# Patient Record
Sex: Male | Born: 1950
Health system: Southern US, Community
[De-identification: ages and names within clinical notes are randomized; demographics above are authoritative.]

## PROBLEM LIST (undated history)

## (undated) DIAGNOSIS — C3492 Malignant neoplasm of unspecified part of left bronchus or lung: Secondary | ICD-10-CM

## (undated) DIAGNOSIS — C7951 Secondary malignant neoplasm of bone: Secondary | ICD-10-CM

## (undated) DIAGNOSIS — Z9221 Personal history of antineoplastic chemotherapy: Secondary | ICD-10-CM

## (undated) DIAGNOSIS — E785 Hyperlipidemia, unspecified: Secondary | ICD-10-CM

## (undated) DIAGNOSIS — T7840XA Allergy, unspecified, initial encounter: Secondary | ICD-10-CM

## (undated) DIAGNOSIS — I4891 Unspecified atrial fibrillation: Secondary | ICD-10-CM

## (undated) DIAGNOSIS — J984 Other disorders of lung: Secondary | ICD-10-CM

## (undated) DIAGNOSIS — I10 Essential (primary) hypertension: Secondary | ICD-10-CM

## (undated) DIAGNOSIS — H269 Unspecified cataract: Secondary | ICD-10-CM

## (undated) DIAGNOSIS — Z973 Presence of spectacles and contact lenses: Secondary | ICD-10-CM

## (undated) DIAGNOSIS — R011 Cardiac murmur, unspecified: Secondary | ICD-10-CM

## (undated) DIAGNOSIS — J449 Chronic obstructive pulmonary disease, unspecified: Secondary | ICD-10-CM

## (undated) DIAGNOSIS — F419 Anxiety disorder, unspecified: Secondary | ICD-10-CM

## (undated) DIAGNOSIS — Z974 Presence of external hearing-aid: Secondary | ICD-10-CM

## (undated) DIAGNOSIS — J189 Pneumonia, unspecified organism: Secondary | ICD-10-CM

## (undated) DIAGNOSIS — K219 Gastro-esophageal reflux disease without esophagitis: Secondary | ICD-10-CM

## (undated) DIAGNOSIS — R1310 Dysphagia, unspecified: Secondary | ICD-10-CM

## (undated) DIAGNOSIS — M8440XA Pathological fracture, unspecified site, initial encounter for fracture: Secondary | ICD-10-CM

## (undated) DIAGNOSIS — E039 Hypothyroidism, unspecified: Secondary | ICD-10-CM

## (undated) DIAGNOSIS — I4811 Longstanding persistent atrial fibrillation: Secondary | ICD-10-CM

## (undated) DIAGNOSIS — Z923 Personal history of irradiation: Secondary | ICD-10-CM

## (undated) DIAGNOSIS — F32A Depression, unspecified: Secondary | ICD-10-CM

## (undated) DIAGNOSIS — E119 Type 2 diabetes mellitus without complications: Secondary | ICD-10-CM

## (undated) DIAGNOSIS — I499 Cardiac arrhythmia, unspecified: Secondary | ICD-10-CM

## (undated) DIAGNOSIS — Z9079 Acquired absence of other genital organ(s): Secondary | ICD-10-CM

## (undated) DIAGNOSIS — M1990 Unspecified osteoarthritis, unspecified site: Secondary | ICD-10-CM

## (undated) HISTORY — DX: Type 2 diabetes mellitus without complications: E11.9

## (undated) HISTORY — DX: Allergy, unspecified, initial encounter: T78.40XA

## (undated) HISTORY — PX: OTHER SURGICAL HISTORY: SHX169

## (undated) HISTORY — DX: Unspecified cataract: H26.9

## (undated) HISTORY — DX: Hyperlipidemia, unspecified: E78.5

## (undated) HISTORY — DX: Malignant neoplasm of unspecified part of left bronchus or lung: C34.92

## (undated) HISTORY — DX: Essential (primary) hypertension: I10

## (undated) HISTORY — DX: Longstanding persistent atrial fibrillation: I48.11

## (undated) HISTORY — DX: Unspecified atrial fibrillation: I48.91

## (undated) HISTORY — PX: CARDIOVERSION: SHX1299

## (undated) HISTORY — DX: Cardiac murmur, unspecified: R01.1

## (undated) HISTORY — PX: MULTIPLE TOOTH EXTRACTIONS: SHX2053

## (undated) HISTORY — PX: TONSILLECTOMY: SUR1361

## (undated) HISTORY — DX: Chronic obstructive pulmonary disease, unspecified: J44.9

## (undated) HISTORY — PX: UPPER GASTROINTESTINAL ENDOSCOPY: SHX188

## (undated) HISTORY — PX: CARDIAC CATHETERIZATION: SHX172

## (undated) HISTORY — DX: Gastro-esophageal reflux disease without esophagitis: K21.9

## (undated) HISTORY — DX: Hypothyroidism, unspecified: E03.9

## (undated) HISTORY — PX: COLONOSCOPY: SHX174

## (undated) HISTORY — DX: Acquired absence of other genital organ(s): Z90.79

---

## 2014-10-01 HISTORY — PX: BRONCHOSCOPY: SUR163

## 2014-10-01 HISTORY — PX: DG BIOPSY LUNG: HXRAD146

## 2014-12-02 HISTORY — PX: LUNG CANCER SURGERY: SHX702

## 2016-06-22 ENCOUNTER — Other Ambulatory Visit: Payer: Self-pay | Admitting: *Deleted

## 2016-06-22 NOTE — Progress Notes (Unsigned)
I received referral on Victor Huber on 06/19/16.  I contacted referring office due to more information needed for referral. I did not hear back and called again to referring office today 06/23/15.  I left vm message both days with my name and phone number to call.

## 2016-07-10 ENCOUNTER — Telehealth: Payer: Self-pay | Admitting: Internal Medicine

## 2016-07-10 NOTE — Telephone Encounter (Signed)
Cld pt to schedule an appt. Appt has been scheduled for the pt to see Dr. Julien Nordmann on 5/16 at 215pm, labs at 145pm. Pt agreed to the appt date and time. Letter mailed.

## 2016-08-13 ENCOUNTER — Other Ambulatory Visit: Payer: Medicare Other

## 2016-08-13 ENCOUNTER — Ambulatory Visit: Payer: Medicare Other | Admitting: Internal Medicine

## 2016-08-14 ENCOUNTER — Other Ambulatory Visit: Payer: Medicare Other

## 2016-08-14 ENCOUNTER — Encounter: Payer: Self-pay | Admitting: Medical Oncology

## 2016-08-14 ENCOUNTER — Ambulatory Visit: Payer: Medicare Other | Admitting: Internal Medicine

## 2016-08-14 ENCOUNTER — Other Ambulatory Visit: Payer: Self-pay | Admitting: Medical Oncology

## 2016-08-14 DIAGNOSIS — C3490 Malignant neoplasm of unspecified part of unspecified bronchus or lung: Secondary | ICD-10-CM

## 2016-08-15 ENCOUNTER — Other Ambulatory Visit (HOSPITAL_BASED_OUTPATIENT_CLINIC_OR_DEPARTMENT_OTHER): Payer: Medicare Other

## 2016-08-15 ENCOUNTER — Telehealth: Payer: Self-pay | Admitting: Internal Medicine

## 2016-08-15 ENCOUNTER — Telehealth: Payer: Self-pay | Admitting: *Deleted

## 2016-08-15 ENCOUNTER — Telehealth: Payer: Self-pay

## 2016-08-15 ENCOUNTER — Ambulatory Visit (HOSPITAL_BASED_OUTPATIENT_CLINIC_OR_DEPARTMENT_OTHER): Payer: Medicare Other | Admitting: Internal Medicine

## 2016-08-15 ENCOUNTER — Encounter: Payer: Self-pay | Admitting: Internal Medicine

## 2016-08-15 DIAGNOSIS — J449 Chronic obstructive pulmonary disease, unspecified: Secondary | ICD-10-CM

## 2016-08-15 DIAGNOSIS — Z85118 Personal history of other malignant neoplasm of bronchus and lung: Secondary | ICD-10-CM

## 2016-08-15 DIAGNOSIS — C3490 Malignant neoplasm of unspecified part of unspecified bronchus or lung: Secondary | ICD-10-CM

## 2016-08-15 DIAGNOSIS — C3492 Malignant neoplasm of unspecified part of left bronchus or lung: Secondary | ICD-10-CM

## 2016-08-15 DIAGNOSIS — Z9079 Acquired absence of other genital organ(s): Secondary | ICD-10-CM

## 2016-08-15 DIAGNOSIS — E032 Hypothyroidism due to medicaments and other exogenous substances: Secondary | ICD-10-CM

## 2016-08-15 DIAGNOSIS — I482 Chronic atrial fibrillation, unspecified: Secondary | ICD-10-CM | POA: Insufficient documentation

## 2016-08-15 DIAGNOSIS — H269 Unspecified cataract: Secondary | ICD-10-CM | POA: Insufficient documentation

## 2016-08-15 DIAGNOSIS — J984 Other disorders of lung: Secondary | ICD-10-CM | POA: Insufficient documentation

## 2016-08-15 DIAGNOSIS — E039 Hypothyroidism, unspecified: Secondary | ICD-10-CM | POA: Diagnosis not present

## 2016-08-15 DIAGNOSIS — J189 Pneumonia, unspecified organism: Secondary | ICD-10-CM | POA: Insufficient documentation

## 2016-08-15 DIAGNOSIS — I4891 Unspecified atrial fibrillation: Secondary | ICD-10-CM

## 2016-08-15 DIAGNOSIS — I480 Paroxysmal atrial fibrillation: Secondary | ICD-10-CM

## 2016-08-15 DIAGNOSIS — M1990 Unspecified osteoarthritis, unspecified site: Secondary | ICD-10-CM | POA: Insufficient documentation

## 2016-08-15 HISTORY — DX: Malignant neoplasm of unspecified part of left bronchus or lung: C34.92

## 2016-08-15 HISTORY — DX: Chronic obstructive pulmonary disease, unspecified: J44.9

## 2016-08-15 HISTORY — DX: Hypothyroidism, unspecified: E03.9

## 2016-08-15 HISTORY — DX: Unspecified atrial fibrillation: I48.91

## 2016-08-15 HISTORY — DX: Acquired absence of other genital organ(s): Z90.79

## 2016-08-15 LAB — COMPREHENSIVE METABOLIC PANEL
ALT: 21 U/L (ref 0–55)
AST: 19 U/L (ref 5–34)
Albumin: 4 g/dL (ref 3.5–5.0)
Alkaline Phosphatase: 104 U/L (ref 40–150)
Anion Gap: 8 mEq/L (ref 3–11)
BUN: 21.6 mg/dL (ref 7.0–26.0)
CHLORIDE: 105 meq/L (ref 98–109)
CO2: 27 meq/L (ref 22–29)
CREATININE: 0.9 mg/dL (ref 0.7–1.3)
Calcium: 9.3 mg/dL (ref 8.4–10.4)
EGFR: 90 mL/min/{1.73_m2} — ABNORMAL LOW (ref >90)
Glucose: 141 mg/dl — ABNORMAL HIGH (ref 70–140)
POTASSIUM: 4.2 meq/L (ref 3.5–5.1)
Sodium: 140 mEq/L (ref 136–145)
Total Bilirubin: 0.98 mg/dL (ref 0.20–1.20)
Total Protein: 6.7 g/dL (ref 6.4–8.3)

## 2016-08-15 LAB — CBC WITH DIFFERENTIAL/PLATELET
BASO%: 0.3 % (ref 0.0–2.0)
Basophils Absolute: 0 10*3/uL (ref 0.0–0.1)
EOS%: 0.9 % (ref 0.0–7.0)
Eosinophils Absolute: 0.1 10*3/uL (ref 0.0–0.5)
HCT: 46.1 % (ref 38.4–49.9)
HGB: 15.7 g/dL (ref 13.0–17.1)
LYMPH%: 29.6 % (ref 14.0–49.0)
MCH: 33 pg (ref 27.2–33.4)
MCHC: 34.1 g/dL (ref 32.0–36.0)
MCV: 96.9 fL (ref 79.3–98.0)
MONO#: 0.3 10*3/uL (ref 0.1–0.9)
MONO%: 5.8 % (ref 0.0–14.0)
NEUT#: 3.7 10*3/uL (ref 1.5–6.5)
NEUT%: 63.4 % (ref 39.0–75.0)
Platelets: 186 10*3/uL (ref 140–400)
RBC: 4.76 10*6/uL (ref 4.20–5.82)
RDW: 13.4 % (ref 11.0–14.6)
WBC: 5.9 10*3/uL (ref 4.0–10.3)
lymph#: 1.7 10*3/uL (ref 0.9–3.3)

## 2016-08-15 NOTE — Progress Notes (Signed)
[place] [phone]   [phone]  CONSULT NOTE  REFERRING PHYSICIAN: Dr. Fransisca Connors, [place]:  66 years old white male diagnosed with lung cancer.   HPI Victor Huber is a 66 y.o. male was past medical history significant for atrial fibrillation, COPD, hypothyroidism, long history of smoking as well as stage IIIa non-small cell lung cancer, adenocarcinoma diagnosed in June 2016. The patient mentioned that on a routine screening CT scan of the chest in 2014 he was found to have small nodule in the left lower lobe. This was followed by observation but repeat CT scan of the chest on 09/20/2014 showed enlargement of the left lower lobe nodule and it measured 1.9 x 2.9 by 1.8 cm in the superior segment of the left lower lobe. There was also enlarged left hilar lymph node and suspicious AP window lymph nodes. A PET scan was performed on 09/21/2014 and it showed hypermetabolic 3.0 cm left lower lobe superior segment mass in addition to hypermetabolic left hilar lymphadenopathy as well as small but hypermetabolic mediastinal lymph nodes. On 10/08/2014 the patient underwent bronchoscopy with endobronchial ultrasound and biopsy of lymph nodes from level 11 L and 7. The final cytology was negative for malignancy. The patient underwent CT-guided biopsy of the left lower lobe lung nodule and the final pathology was consistent with poorly differentiated adenocarcinoma. MRI of the brain on 11/08/2014 was negative for metastatic disease to the brain. Treatment options were discussed with the patient including neoadjuvant concurrent chemoradiation versus surgical resection. He elected to undergo surgical resection but there was large bulky and invasive lymphadenopathy that could not be dissected. The patient ended with wedge resection of the left lower lobe lung nodule in addition to the AP window lymph node dissection. The final  pathology was consistent with poorly differentiated adenocarcinoma. Immunohistochemical stains were strongly positive for cytokeratin 7 but negative for TTF-1, Napsin A and p63. I don't see any record of molecular studies for the resected tissue.  The patient was treated with a course of concurrent chemoradiation between October 11 through 03/01/2015 with weekly carboplatin and paclitaxel. His treatment was interrupted secondary to radiation induced pneumonitis but the patient completed 7 weeks of concurrent chemoradiation. He did not receive any consolidation chemotherapy. He had stable disease after his treatment. The patient was followed by the imaging studies at regular basis and the last CT scan of the chest performed on 02/20/2016 showed stable lymphedema, left hilar soft tissue fullness with left lung volume loss not significantly changed. There was also stable small right adrenal nodule and a stable small mediastinal lymph nodes. Unfortunately the patient did not bring any CDs with the imaging studies that were performed in [place]. He moved recently to [facility] to be close to his son and grandchildren.  When seen today he continues to have mild shortness breath increased with exertion as well as chronic back pain. He denied having any chest pain, cough or hemoptysis. He denied having any weight loss or night sweats. He has no nausea, vomiting, diarrhea or constipation. He denied having any headache or visual changes. Family history significant for mother with breast cancer and father died from old age. The patient is married and has 2 children. He was accompanied by his wife, [name]. He is to work as a Teacher, early years/pre. He has a history of smoking 1 pack per day for around 50 years and quit 4 years ago. He drinks alcohol occasionally  and no history of drug abuse.  HPI  Past Medical History:  Diagnosis Date  . Adenocarcinoma of left lung, stage 3 ([place]) 08/15/2016  . Atrial  fibrillation ([place]) 08/15/2016  . Atrial fibrillation ([place])   . COPD (chronic obstructive pulmonary disease) ([place]) 08/15/2016  . Hypothyroid 08/15/2016  . S/P TURP 08/15/2016    Past Surgical History:  Procedure Laterality Date  . Status post TURP      Family History  Problem Relation Age of Onset  . Cancer Mother     Social History Social History  Substance Use Topics  . Smoking status: Not on file  . Smokeless tobacco: Not on file  . Alcohol use Not on file    Not on File  Current Outpatient Prescriptions  Medication Sig Dispense Refill  . amiodarone (PACERONE) 200 MG tablet Take 200 mg by mouth 2 (two) times daily.    Marland Kitchen apixaban (ELIQUIS) 5 MG TABS tablet Take 5 mg by mouth 2 (two) times daily.    Marland Kitchen atenolol (TENORMIN) 25 MG tablet Take by mouth daily.    Marland Kitchen atorvastatin (LIPITOR) 40 MG tablet Take 40 mg by mouth daily.    . citalopram (CELEXA) 40 MG tablet Take 40 mg by mouth daily.    . cyclobenzaprine (FLEXERIL) 10 MG tablet Take 10 mg by mouth at bedtime.    . famciclovir (FAMVIR) 500 MG tablet     . fluticasone furoate-vilanterol (BREO ELLIPTA) 100-25 MCG/INH AEPB Inhale 1 puff into the lungs daily.    Marland Kitchen gabapentin (NEURONTIN) 300 MG capsule Take 300 mg by mouth 3 (three) times daily.    Marland Kitchen HYDROcodone-acetaminophen (NORCO/VICODIN) 5-325 MG tablet Take 1 tablet by mouth every 6 (six) hours as needed for moderate pain.    Marland Kitchen levothyroxine (SYNTHROID, LEVOTHROID) 200 MCG tablet Take 200 mcg by mouth daily before breakfast.    . levothyroxine (SYNTHROID, LEVOTHROID) 25 MCG tablet Take 25 mcg by mouth daily before breakfast.    . LORazepam (ATIVAN) 0.5 MG tablet     . metoprolol succinate (TOPROL-XL) 50 MG 24 hr tablet Take 50 mg by mouth daily. Take with or immediately following a meal.    . metoprolol tartrate (LOPRESSOR) 50 MG tablet Take 50 mg by mouth 2 (two) times daily.    . tamsulosin (FLOMAX) 0.4 MG CAPS capsule Take 0.4 mg by mouth.     No current  facility-administered medications for this visit.     Review of Systems  Constitutional: positive for fatigue Eyes: negative Ears, nose, mouth, throat, and face: negative Respiratory: positive for dyspnea on exertion Cardiovascular: negative Gastrointestinal: negative Genitourinary:negative Integument/breast: negative Hematologic/lymphatic: negative Musculoskeletal:positive for back pain Neurological: negative Behavioral/Psych: negative Endocrine: negative Allergic/Immunologic: negative  Physical Exam  CZY:SAYTK, healthy, no distress, well nourished and well developed SKIN: skin color, texture, turgor are normal, no rashes or significant lesions HEAD: Normocephalic, No masses, lesions, tenderness or abnormalities EYES: normal, PERRLA, Conjunctiva are pink and non-injected EARS: External ears normal, Canals clear OROPHARYNX:no exudate, no erythema and lips, buccal mucosa, and tongue normal  NECK: supple, no adenopathy, no JVD LYMPH:  no palpable lymphadenopathy, no hepatosplenomegaly LUNGS: clear to auscultation , and palpation HEART: regular rate & rhythm, no murmurs and no gallops ABDOMEN:abdomen soft, non-tender, obese, normal bowel sounds and no masses or organomegaly BACK: No CVA tenderness, Range of motion is normal EXTREMITIES:no joint deformities, effusion, or inflammation, no edema, no skin discoloration  NEURO: alert & oriented x 3 with fluent speech, no focal motor/sensory deficits  PERFORMANCE STATUS: ECOG 1  LABORATORY DATA: Lab Results  Component Value Date   WBC 5.9 08/15/2016   HGB 15.7 08/15/2016   HCT 46.1 08/15/2016   MCV 96.9 08/15/2016   PLT 186 08/15/2016      Chemistry   No results found for: NA, K, CL, CO2, BUN, CREATININE, GLU No results found for: CALCIUM, ALKPHOS, AST, ALT, BILITOT     RADIOGRAPHIC STUDIES: No results found.  ASSESSMENT:This is a very pleasant 66 years old white male diagnosed with a stage IIIa (T2a, N2, M0) non-small  cell lung cancer, poorly differentiated adenocarcinoma presented with right lower lobe lung mass in addition to mediastinal lymphadenopathy status post wedge resection of the left lower lobe lung mass as well as AP window lymph node dissection but there was residual metastatic mediastinal lymphadenopathy that could not be resected. The patient underwent a course of concurrent chemoradiation with weekly carboplatin and paclitaxel in [place] completed 03/01/2015. Molecular studies and PDL 1 status are unknown. He has been observation since that time. His last imaging studies was on 02/20/2016 and showed stable disease. He recently moved to [place].  PLAN: I had a lengthy discussion with the patient and his wife today about his current condition and treatment options. I recommended for the patient to have repeat CT scan of the chest performed next week for restaging of his disease. I also requested CDs with images of the previous his scan to be sent to my office for comparison. The patient has multiple other medical problems and he has not established care with any of the other specialties yet. I will refer the patient to establish care with a primary care physician. I also referred him to pulmonary medicine for his COPD and to cardiology for his history of atrial fibrillation. [place] upcoming scan showed no evidence for disease progression, I would see him back for follow-up visit in 6 months with repeat CT scan of the chest for restaging of his disease. The patient and his wife agreed to the current plan. He was advised to call immediately if he has any concerning symptoms in the interval. The patient voices understanding of current disease status and treatment options and is in agreement with the current care plan. All questions were answered. The patient knows to call the clinic with any problems, questions or concerns. We can certainly see the patient much sooner if necessary. Thank you so much for  allowing me to participate in the care of Victor Huber. I will continue to follow up the patient with you and assist in his care.  I spent 40 minutes counseling the patient face to face. The total time spent in the appointment was 60 minutes.  Disclaimer: This note was dictated with voice recognition software. Similar sounding words can inadvertently be transcribed and may not be corrected upon review.   Bellamarie Pflug K. Aug 15, 2016, 2:08 PM

## 2016-08-15 NOTE — Telephone Encounter (Signed)
lmtcb X1 for pt- RA has availability for a consult in [facility] office as early as this Thursday (08/16/16).

## 2016-08-15 NOTE — Telephone Encounter (Signed)
Release of information faxed to [facility] and [employer], request for pt's records and imaging on disc be sent to MD for review.

## 2016-08-15 NOTE — Telephone Encounter (Signed)
Scheduled appt per 5/16 los. Gave patient AVS and calender per 5/16. Per [place] with Pulmonary said the referral is in work que and they will contact patient.

## 2016-08-16 NOTE — Telephone Encounter (Signed)
Pt aware that we are going to keep his appt as scheduled for 09/26/16 Pt aware to contact our office anytime to check for sooner appts. Nothing further needed.

## 2016-08-16 NOTE — Telephone Encounter (Signed)
lmtcb for pt.  Appt scheduled with MW on 5/24 at 245, will need to confirm this appt with pt.

## 2016-08-16 NOTE — Telephone Encounter (Signed)
Patient called back and wanted to keep the original appt made for 06/27.  He states if we reschedule this he needs it to be on a Wed or Thurs in the morning, no other day and no afternoons.

## 2016-08-21 ENCOUNTER — Ambulatory Visit
Admission: RE | Admit: 2016-08-21 | Discharge: 2016-08-21 | Disposition: A | Discharge status: Home / Self Care | Payer: Medicare Other | Source: Ambulatory Visit | Attending: Internal Medicine | Admitting: Internal Medicine

## 2016-08-21 ENCOUNTER — Other Ambulatory Visit: Payer: Self-pay | Admitting: Internal Medicine

## 2016-08-21 ENCOUNTER — Encounter: Payer: Self-pay | Admitting: Medical Oncology

## 2016-08-21 DIAGNOSIS — C801 Malignant (primary) neoplasm, unspecified: Secondary | ICD-10-CM

## 2016-08-23 ENCOUNTER — Other Ambulatory Visit: Payer: Self-pay | Admitting: Internal Medicine

## 2016-08-23 ENCOUNTER — Inpatient Hospital Stay
Admission: RE | Admit: 2016-08-23 | Discharge: 2016-08-23 | Disposition: A | Discharge status: Home / Self Care | Payer: Self-pay | Source: Ambulatory Visit | Attending: Internal Medicine | Admitting: Internal Medicine

## 2016-08-23 ENCOUNTER — Institutional Professional Consult (permissible substitution): Payer: Medicare Other | Admitting: Internal Medicine

## 2016-08-23 ENCOUNTER — Ambulatory Visit
Admission: RE | Admit: 2016-08-23 | Discharge: 2016-08-23 | Disposition: A | Discharge status: Home / Self Care | Payer: Self-pay | Source: Ambulatory Visit | Attending: Internal Medicine | Admitting: Internal Medicine

## 2016-08-23 DIAGNOSIS — C801 Malignant (primary) neoplasm, unspecified: Secondary | ICD-10-CM

## 2016-08-27 NOTE — Progress Notes (Signed)
Cardiology Office Note   Date:  08/29/2016   ID:  Victor Huber, DOB Jul 19, 1950, MRN [id number]  PCP:  Patient, No Pcp Per  Cardiologist:   Victor Latch, MD   Chief Complaint  Patient presents with  . New Patient (Initial Visit)  . Leg Pain    pain in legs at night.      History of Present Illness: Victor Huber is a 66 y.o. male with persistent atrial fibrillation, COPD, hypothyroidism and stage IIa non-small cell lung cancer s/p resection and chemotherapy and prior tobacco abuse who is being seen today for the evaluation of atrial fibrillation at the request of Victor Bears, MD.  He moved from [place] 3 months ago and established care with Dr. Julien Huber.  At that appointment he reported mild exertional dyspnea.  Victor Huber Reports that he was diagnosed with atrial fibrillation 6 or 7 years ago. He has been chronically in atrial fibrillation lately. In the past he was on amiodarone. However this was discontinued when he was diagnosed with lung cancer in 2016. After that he was switched to another antiarrhythmic. He developed a ventricular fibrillation arrest. He is unsure what medication he was on at that time. He also had 2 cardioversions that were unsuccessful.  His main complaint is fatigue, which she attributes to the atrial fibrillation. He rarely feels his heart racing. He denies chest pain, lightheadedness, or dizziness. He does have shortness of breath chronically, which he attributes to his COPD.  He denies lower extremity edema, orthopnea, or PND.  He is due for a surveillance CT for his lung cancer tomorrow.   Past Medical History:  Diagnosis Date  . Adenocarcinoma of left lung, stage 3 ([place]) 08/15/2016  . Atrial fibrillation ([place]) 08/15/2016  . Atrial fibrillation ([place])   . COPD (chronic obstructive pulmonary disease) ([place]) 08/15/2016  . Hypothyroid 08/15/2016  . Longstanding persistent atrial fibrillation ([place]) 08/29/2016  . S/P TURP 08/15/2016    Past Surgical  History:  Procedure Laterality Date  . Status post TURP       Current Outpatient Prescriptions  Medication Sig Dispense Refill  . apixaban (ELIQUIS) 5 MG TABS tablet Take 5 mg by mouth 2 (two) times daily.    Marland Kitchen atorvastatin (LIPITOR) 40 MG tablet Take 40 mg by mouth daily.    . citalopram (CELEXA) 40 MG tablet Take 40 mg by mouth daily.    Marland Kitchen HYDROcodone-acetaminophen (NORCO/VICODIN) 5-325 MG tablet Take 1 tablet by mouth every 6 (six) hours as needed for moderate pain.    Marland Kitchen levothyroxine (SYNTHROID, LEVOTHROID) 200 MCG tablet Take 200 mcg by mouth daily before breakfast.    . levothyroxine (SYNTHROID, LEVOTHROID) 25 MCG tablet Take 25 mcg by mouth daily before breakfast.    . LORazepam (ATIVAN) 0.5 MG tablet     . tamsulosin (FLOMAX) 0.4 MG CAPS capsule Take 0.4 mg by mouth.    Marland Kitchen atenolol (TENORMIN) 50 MG tablet Take 1 tablet (50 mg total) by mouth daily. 90 tablet 3   No current facility-administered medications for this visit.     Allergies:   Patient has no known allergies.    Social History:  The patient  reports that he quit smoking about 4 years ago. His smoking use included Cigarettes. He has a 50.00 pack-year smoking history. He has never used smokeless tobacco. He reports that he does not drink alcohol or use drugs.   Family History:  The patient's family history includes Breast cancer in his maternal aunt, maternal aunt,  and mother.    ROS:  Please see the history of present illness.   Otherwise, review of systems are positive for none.   All other systems are reviewed and negative.    PHYSICAL EXAM: VS:  BP (!) 136/91   Pulse 92   Ht 6\' 1"  (1.854 m)   Wt 119.7 kg (263 lb 12.8 oz)   BMI 34.80 kg/m  , BMI Body mass index is 34.8 kg/m. GENERAL:  Well appearing HEENT:  Pupils equal round and reactive, fundi not visualized, oral mucosa unremarkable NECK:  No jugular venous distention, waveform within normal limits, carotid upstroke brisk and symmetric, no bruits, no  thyromegaly LYMPHATICS:  No cervical adenopathy LUNGS:  Clear to auscultation bilaterally HEART:  Irregularly irregular.  PMI not displaced or sustained,S1 and S2 within normal limits, no S3, no S4, no clicks, no rubs, no murmurs ABD:  Flat, positive bowel sounds normal in frequency in pitch, no bruits, no rebound, no guarding, no midline pulsatile mass, no hepatomegaly, no splenomegaly EXT:  2 plus pulses throughout, no edema, no cyanosis no clubbing SKIN:  No rashes no nodules NEURO:  Cranial nerves II through XII grossly intact, motor grossly intact throughout PSYCH:  Cognitively intact, oriented to person place and time   EKG:  EKG is ordered today. The ekg ordered 08/29/16 demonstrates atrial fibrillation. Rate 92 bpm. PVCs. Right axis deviation. Incomplete right bundle branch block. Nonspecific ST/T changes.   Recent Labs: 08/15/2016: ALT 21; BUN 21.6; Creatinine 0.9; HGB 15.7; Platelets 186; Potassium 4.2; Sodium 140    Lipid Panel No results found for: CHOL, TRIG, HDL, CHOLHDL, VLDL, LDLCALC, LDLDIRECT    Wt Readings from Last 3 Encounters:  08/29/16 119.7 kg (263 lb 12.8 oz)  08/15/16 119.6 kg (263 lb 9.6 oz)      ASSESSMENT AND PLAN:  # Longstanding persistent atrial fibrillation: Victor Huber reports feeling fatigued. He felt better on atenolol. We will switch from metoprolol to atenolol 50 mg daily. He is also interested in trying to restore sinus rhythm. He previously did well on amiodarone but this was discontinued when he developed lung cancer. We will refer him to the atrial fibrillation clinic to discuss options. We will also obtain his records, as he reportedly had an episode of ventricular fibrillation on an antiarrhythmic.  We will get a copy of his most recent echo that reportedly is a couple months old.  If he doesn't have a recent one we will repeat for assessment of systolic function and LV size.  He was resistant to the idea of ablation in the past.   #  Elevated blood pressure: Switch to atenolol as above.   Current medicines are reviewed at length with the patient today.  The patient does not have concerns regarding medicines.  The following changes have been made:  no change  Labs/ tests ordered today include:   Orders Placed This Encounter  Procedures  . Amb Referral to AFIB Clinic  . EKG 12-Lead     Disposition:   FU with Zedrick Springsteen C. Oval Linsey, MD, [facility] in 4 months.  Atrial fibrillation clinic in 1 month.     This note was written with the assistance of speech recognition software.  Please excuse any transcriptional errors.  Signed, Kenyia Wambolt C. Oval Linsey, MD, [facility]  08/29/2016 9:49 AM    [facility]

## 2016-08-28 ENCOUNTER — Ambulatory Visit
Admission: RE | Admit: 2016-08-28 | Discharge: 2016-08-28 | Disposition: A | Discharge status: Home / Self Care | Payer: Medicare Other | Source: Ambulatory Visit | Attending: Internal Medicine | Admitting: Internal Medicine

## 2016-08-28 ENCOUNTER — Other Ambulatory Visit: Payer: Self-pay | Admitting: Internal Medicine

## 2016-08-28 ENCOUNTER — Telehealth: Payer: Self-pay | Admitting: *Deleted

## 2016-08-28 DIAGNOSIS — C3492 Malignant neoplasm of unspecified part of left bronchus or lung: Secondary | ICD-10-CM

## 2016-08-28 DIAGNOSIS — I480 Paroxysmal atrial fibrillation: Secondary | ICD-10-CM

## 2016-08-28 DIAGNOSIS — C801 Malignant (primary) neoplasm, unspecified: Secondary | ICD-10-CM

## 2016-08-28 DIAGNOSIS — J449 Chronic obstructive pulmonary disease, unspecified: Secondary | ICD-10-CM

## 2016-08-28 DIAGNOSIS — E032 Hypothyroidism due to medicaments and other exogenous substances: Secondary | ICD-10-CM

## 2016-08-28 DIAGNOSIS — Z9079 Acquired absence of other genital organ(s): Secondary | ICD-10-CM

## 2016-08-28 NOTE — Telephone Encounter (Signed)
"  My husband is to have CT scan chest this month.  We have not heard anything.  Was something mis-communicated?"  Pre-certification coordinator confirms approval for the May CT scan with November pending.  Spoke with [facility] Scheduling who will call patient to schedule for this month.

## 2016-08-29 ENCOUNTER — Ambulatory Visit (INDEPENDENT_AMBULATORY_CARE_PROVIDER_SITE_OTHER): Payer: Medicare Other | Admitting: Cardiovascular Disease

## 2016-08-29 ENCOUNTER — Encounter: Payer: Self-pay | Admitting: Cardiovascular Disease

## 2016-08-29 VITALS — BP 136/91 | HR 92 | Ht 73.0 in | Wt 263.8 lb

## 2016-08-29 DIAGNOSIS — I481 Persistent atrial fibrillation: Secondary | ICD-10-CM | POA: Diagnosis not present

## 2016-08-29 DIAGNOSIS — I4811 Longstanding persistent atrial fibrillation: Secondary | ICD-10-CM

## 2016-08-29 DIAGNOSIS — I4819 Other persistent atrial fibrillation: Secondary | ICD-10-CM

## 2016-08-29 DIAGNOSIS — I10 Essential (primary) hypertension: Secondary | ICD-10-CM | POA: Insufficient documentation

## 2016-08-29 HISTORY — DX: Longstanding persistent atrial fibrillation: I48.11

## 2016-08-29 MED ORDER — ATENOLOL 50 MG PO TABS: 50.0000 mg | ORAL_TABLET | Freq: Every day | ORAL | 3 refills | Status: DC

## 2016-08-29 MED ORDER — ATENOLOL 50 MG PO TABS: 50.0000 mg | ORAL_TABLET | Freq: Every day | ORAL | 1 refills | Status: DC

## 2016-08-29 NOTE — Patient Instructions (Addendum)
Medication Instructions:  STOP METOPROLOL   START ATENOLOL 50 MG DAILY   Labwork: NONE  Testing/Procedures: NONE  Follow-Up: Your physician recommends that you schedule a follow-up appointment in: A FIB CLINIC IN [place] physician recommends that you schedule a follow-up appointment in: DR [facility] 4 MONTHS   If you need a refill on your cardiac medications before your next appointment, please call your pharmacy.

## 2016-08-30 ENCOUNTER — Ambulatory Visit (HOSPITAL_COMMUNITY)
Admission: RE | Admit: 2016-08-30 | Discharge: 2016-08-30 | Disposition: A | Discharge status: Home / Self Care | Payer: Medicare Other | Source: Ambulatory Visit | Attending: Internal Medicine | Admitting: Internal Medicine

## 2016-08-30 DIAGNOSIS — I700 Atherosclerosis of aorta: Secondary | ICD-10-CM | POA: Diagnosis not present

## 2016-08-30 DIAGNOSIS — I2510 Atherosclerotic heart disease of native coronary artery without angina pectoris: Secondary | ICD-10-CM | POA: Insufficient documentation

## 2016-08-30 DIAGNOSIS — J449 Chronic obstructive pulmonary disease, unspecified: Secondary | ICD-10-CM | POA: Diagnosis not present

## 2016-08-30 DIAGNOSIS — C3492 Malignant neoplasm of unspecified part of left bronchus or lung: Secondary | ICD-10-CM

## 2016-08-30 DIAGNOSIS — E032 Hypothyroidism due to medicaments and other exogenous substances: Secondary | ICD-10-CM

## 2016-08-30 DIAGNOSIS — Z9079 Acquired absence of other genital organ(s): Secondary | ICD-10-CM

## 2016-08-30 DIAGNOSIS — I480 Paroxysmal atrial fibrillation: Secondary | ICD-10-CM

## 2016-08-30 DIAGNOSIS — J439 Emphysema, unspecified: Secondary | ICD-10-CM | POA: Insufficient documentation

## 2016-08-30 MED ORDER — IOPAMIDOL (ISOVUE-300) INJECTION 61%
INTRAVENOUS | Status: AC
Start: 2016-08-30 — End: 2016-08-30
  Filled 2016-08-30: qty 75

## 2016-08-30 MED ORDER — IOPAMIDOL (ISOVUE-300) INJECTION 61%
75.0000 mL | Freq: Once | INTRAVENOUS | Status: AC | PRN
  Administered 2016-08-30: 75 mL via INTRAVENOUS

## 2016-09-26 ENCOUNTER — Other Ambulatory Visit: Payer: Medicare Other

## 2016-09-26 ENCOUNTER — Ambulatory Visit (INDEPENDENT_AMBULATORY_CARE_PROVIDER_SITE_OTHER): Payer: Medicare Other | Admitting: Pulmonary Disease

## 2016-09-26 ENCOUNTER — Telehealth: Payer: Self-pay | Admitting: Pulmonary Disease

## 2016-09-26 ENCOUNTER — Encounter: Payer: Self-pay | Admitting: Pulmonary Disease

## 2016-09-26 VITALS — BP 112/80 | HR 71 | Ht 73.0 in | Wt 267.4 lb

## 2016-09-26 DIAGNOSIS — C3492 Malignant neoplasm of unspecified part of left bronchus or lung: Secondary | ICD-10-CM

## 2016-09-26 DIAGNOSIS — E785 Hyperlipidemia, unspecified: Secondary | ICD-10-CM | POA: Insufficient documentation

## 2016-09-26 DIAGNOSIS — J432 Centrilobular emphysema: Secondary | ICD-10-CM

## 2016-09-26 DIAGNOSIS — F419 Anxiety disorder, unspecified: Secondary | ICD-10-CM | POA: Insufficient documentation

## 2016-09-26 NOTE — Patient Instructions (Signed)
   Let me know if you have any new breathing problems or questions before your next appointment.  We will get records from your previous Pulmonologist.  TESTS ORDERED: 1. Serum Alpha-1 Antitrypsin Phenotype

## 2016-09-26 NOTE — Telephone Encounter (Signed)
A medical release form was faxed to [facility] at [phone] for the continuation of care; the medical records were received. Pt made aware of the receiving of the records. Nothing further is needed.

## 2016-09-26 NOTE — Progress Notes (Signed)
Subjective:    Patient ID: Victor Huber, male    DOB: [birth year], 66 y.o.   MRN: [id number]  HPI He reports he was diagnosed with COPD at the same time as his cancer in 2016. He denies any dyspnea, coughing, or wheezing at that time. He has a rescue inhaler but never uses it. He developed radiation pneumonitis. He was treated with Prednisone and tapered off a couple of years ago. Denies any history of recurrent bronchitis or pneumonia. No chest pain, tightness, or pressure. No abdominal pain, nausea, or emesis. No persistent headache. No focal vision loss, weakness, numbness, or tingling. No history of breathing problems, asthma, or allergies as a child or young adult.   Review of Systems No dysuria or hematuria. Does have urinary hesitancy. No rashes or bruising. A pertinent 14 point review of systems is negative except as per the history of presenting illness.  No Known Allergies  Current Outpatient Prescriptions on File Prior to Visit  Medication Sig Dispense Refill  . apixaban (ELIQUIS) 5 MG TABS tablet Take 5 mg by mouth 2 (two) times daily.    Marland Kitchen atorvastatin (LIPITOR) 40 MG tablet Take 40 mg by mouth daily.    . citalopram (CELEXA) 40 MG tablet Take 40 mg by mouth daily.    Marland Kitchen HYDROcodone-acetaminophen (NORCO/VICODIN) 5-325 MG tablet Take 1 tablet by mouth every 6 (six) hours as needed for moderate pain.    Marland Kitchen levothyroxine (SYNTHROID, LEVOTHROID) 200 MCG tablet Take 200 mcg by mouth daily before breakfast.    . levothyroxine (SYNTHROID, LEVOTHROID) 25 MCG tablet Take 25 mcg by mouth daily before breakfast.    . LORazepam (ATIVAN) 0.5 MG tablet     . tamsulosin (FLOMAX) 0.4 MG CAPS capsule Take 0.4 mg by mouth.    Marland Kitchen atenolol (TENORMIN) 50 MG tablet Take 1 tablet (50 mg total) by mouth daily. (Patient not taking: Reported on 09/26/2016) 90 tablet 3   No current facility-administered medications on file prior to visit.     Past Medical History:  Diagnosis Date  . Adenocarcinoma of  left lung, stage 3 ([place]) 08/15/2016  . Atrial fibrillation ([place]) 08/15/2016  . Atrial fibrillation ([place])   . COPD (chronic obstructive pulmonary disease) ([place]) 08/15/2016  . Hyperlipidemia   . Hypothyroid 08/15/2016  . Longstanding persistent atrial fibrillation ([place]) 08/29/2016  . S/P TURP 08/15/2016    Past Surgical History:  Procedure Laterality Date  . BRONCHOSCOPY  10/2014  . CARDIOVERSION     x2  . DG BIOPSY LUNG Left 10/2014   FNA - Adenocarcinoma   . LUNG CANCER SURGERY Left 12/2014   Wedge Resection   . Status post TURP      Family History  Problem Relation Age of Onset  . Breast cancer Mother   . Breast cancer Maternal Aunt   . Breast cancer Maternal Aunt   . Lung disease Neg Hx     Social History   Social History  . Marital status: Married    Spouse name: N/A  . Number of children: N/A  . Years of education: N/A   Social History Main Topics  . Smoking status: Former Smoker    Packs/day: 1.00    Years: 50.00    Types: Cigarettes    Quit date: 08/15/2012  . Smokeless tobacco: Never Used  . Alcohol use No  . Drug use: No  . Sexual activity: Not Asked   Other Topics Concern  . None   Social History Narrative   Financial controller  Pulmonary (09/26/16):   Originally from [place]. Moved to [facility] February 2018. Always lived in [place]. Moved to be closer to children & grandchildren. No international travel. 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 No mold exposure. No bird exposure or hot tub exposure. Enjoys reading. Previously enjoyed wood working with domestic woods.       Objective:   Physical Exam BP 112/80 (BP Location: Right Arm, Cuff Size: Large)   Pulse 71   Ht 6\' 1"  (1.854 m)   Wt 267 lb 6.4 oz (121.3 kg)   SpO2 97%   BMI 35.28 kg/m  General:  Awake. Alert. No acute distress. Central obesity. Integument:  Warm & dry. No rash on exposed skin. No bruising on exposed skin. Extremities:  No  cyanosis or clubbing.  Lymphatics:  No appreciated cervical or supraclavicular lymphadenoapthy. HEENT:  Moist mucus membranes. No oral ulcers. No scleral injection or icterus. Minimal nasal turbinate swelling. Cardiovascular:  Regular rate. No edema. No appreciable JVD.  Pulmonary:  Mild crackles left mid lung zone/base. Symmetric chest wall expansion. No accessory muscle use on room air. Abdomen: Soft. Normal bowel sounds. Protuberant. Grossly nontender. Musculoskeletal:  Normal bulk and tone. Hand grip strength 5/5 bilaterally. No joint deformity or effusion appreciated. Neurological:  CN 2-12 grossly in tact. No meningismus. Moving all 4 extremities equally. Symmetric brachioradialis deep tendon reflexes. Psychiatric:  Mood and affect congruent. Speech normal rhythm, rate & tone.   IMAGING CT CHEST W/ CONTRAST 08/30/16 (personally reviewed by me):  Subcarinal lymph node measuring 1.1 cm in short axis. No other pathologically enlarged mediastinal or hilar adenopathy appreciated. No pleural effusion. Pleural thickening on the left with left infrahilar consolidation with air bronchograms present with an opacity. No other parenchymal opacity, nodule, or mass appreciated. No pericardial effusion. Apical predominant centrilobular emphysematous changes.  PATHOLOGY Left Lower Lobe Wedge Resection (12/15/14): Adenocarcinoma 3 cm in greatest dimension & poorly differentiated. Lymphovascular invasion present. Invasive carcinoma 3 mm from parenchymal margin. Lymph Node Level 11L, 7 & AP Window (12/15/14): Positive only in AP window node for metastatic poorly differentiated adenocarcinoma. FNA Left Lower Lobe Mass (10/27/14): Invasive poorly differentiated adenocarcinoma.    Assessment & Plan:  66 y.o. male with previous diagnosis of COPD as well as non-small cell lung cancer of the left lung diagnosed in 2016. Patient has a long-standing history of tobacco use.  Reviewing patient's CT scan of his chest does  show a borderline enlarged subcarinal lymph node but no other signs that would suggest recurrence of malignancy. Patient has no symptoms from his underlying COPD with emphysema. It's likely this was secondary to his chronic tobacco use. He also has snoring and probable sleep apnea but is very hasn't undergo sleep testing. I instructed the patient contact my office if he had any new breathing problems or questions before his next appointment.   1. COPD with centrilobular emphysema:  Screening for alpha-1 antitrypsin deficiency. Continue albuterol inhaler as needed. Obtaining records from previous pulmonologist. 2. Stage IIIa NSCLC: Following with medical oncology.Has appropriate follow-up. 3. Snoring: Likely has underlying sleep apnea. Patient hesitant to undergo polysomnogram. Plan to readdress at next appointment. 4. Health maintenance: Reports he previously did have pneumonia vaccine 2016/2017. 5. Follow-up: Return to clinic in 2 months or sooner if needed.  Sonia Baller Ashok Cordia, M.D. [facility] Pager:  [phone] After 3pm or if no response, call [phone] 9:38 AM 09/26/16

## 2016-09-27 ENCOUNTER — Encounter (HOSPITAL_COMMUNITY): Payer: Self-pay | Admitting: Nurse Practitioner

## 2016-09-27 ENCOUNTER — Ambulatory Visit (HOSPITAL_COMMUNITY)
Admission: RE | Admit: 2016-09-27 | Discharge: 2016-09-27 | Disposition: A | Discharge status: Home / Self Care | Payer: Medicare Other | Source: Ambulatory Visit | Attending: Nurse Practitioner | Admitting: Nurse Practitioner

## 2016-09-27 VITALS — BP 110/70 | HR 102 | Ht 73.0 in | Wt 266.8 lb

## 2016-09-27 DIAGNOSIS — Z85118 Personal history of other malignant neoplasm of bronchus and lung: Secondary | ICD-10-CM | POA: Diagnosis not present

## 2016-09-27 DIAGNOSIS — I481 Persistent atrial fibrillation: Secondary | ICD-10-CM | POA: Diagnosis not present

## 2016-09-27 DIAGNOSIS — Z79891 Long term (current) use of opiate analgesic: Secondary | ICD-10-CM | POA: Diagnosis not present

## 2016-09-27 DIAGNOSIS — Z7901 Long term (current) use of anticoagulants: Secondary | ICD-10-CM | POA: Insufficient documentation

## 2016-09-27 DIAGNOSIS — Z79899 Other long term (current) drug therapy: Secondary | ICD-10-CM | POA: Insufficient documentation

## 2016-09-27 DIAGNOSIS — Z79890 Hormone replacement therapy (postmenopausal): Secondary | ICD-10-CM | POA: Diagnosis not present

## 2016-09-27 DIAGNOSIS — Z87891 Personal history of nicotine dependence: Secondary | ICD-10-CM | POA: Diagnosis not present

## 2016-09-27 DIAGNOSIS — I4819 Other persistent atrial fibrillation: Secondary | ICD-10-CM

## 2016-09-27 NOTE — Progress Notes (Signed)
Primary Care Physician: Victor Huber, No Pcp Per Referring Physician: Dr. Emmit Pomfret is a 66 y.o. male with a h/o COPD, previous long term smoker, hypothyroidism,  persistent afib since fall of 2017, small cellung cancer treated in June 2016 in the afib clinic for evaluation. He recently moved from [place] to this area to be near to children/grandchildren  He developed paroxysmal afib around 2014.He was initially  placed on flecainide but developed wide complex tachycardia, with subsequent cardiac arrest with out of hospital resuscitation. He was then placed on amiodarone but was this was stopped due to treatment of lung cancer in 2016 and concerns of lung toxicity. He was then hospitalized in October 2017 for sotalol load with cardioversion which was unsuccessful. He has been in rate controlled afib since then. Ablation was discussed with him after failing sotalol but he was not ready for a procedure. He would like to restore SR if possible. He has some fatigue and exertional shortness of breath  and thinks these symptoms could be improved.  He currently denies any alcohol, excessive caffeine use. Refuses to have a sleep study but he wife does not think he has apnea spells. Some snoring.  Today, he denies symptoms of palpitations, chest pain, shortness of breath, orthopnea, PND, lower extremity edema, dizziness, presyncope, syncope, or neurologic sequela. The Victor Huber is tolerating medications without difficulties and is otherwise without complaint today.   Past Medical History:  Diagnosis Date  . Adenocarcinoma of left lung, stage 3 ([place]) 08/15/2016  . Atrial fibrillation ([place]) 08/15/2016  . Atrial fibrillation ([place])   . COPD (chronic obstructive pulmonary disease) ([place]) 08/15/2016  . Hyperlipidemia   . Hypothyroid 08/15/2016  . Longstanding persistent atrial fibrillation ([place]) 08/29/2016  . S/P TURP 08/15/2016   Past Surgical History:  Procedure Laterality Date  . BRONCHOSCOPY   10/2014  . CARDIOVERSION     x2  . DG BIOPSY LUNG Left 10/2014   FNA - Adenocarcinoma   . LUNG CANCER SURGERY Left 12/2014   Wedge Resection   . Status post TURP      Current Outpatient Prescriptions  Medication Sig Dispense Refill  . apixaban (ELIQUIS) 5 MG TABS tablet Take 5 mg by mouth 2 (two) times daily.    Marland Kitchen atorvastatin (LIPITOR) 40 MG tablet Take 40 mg by mouth daily.    . citalopram (CELEXA) 40 MG tablet Take 40 mg by mouth daily.    Marland Kitchen HYDROcodone-acetaminophen (NORCO/VICODIN) 5-325 MG tablet Take 1 tablet by mouth every 6 (six) hours as needed for moderate pain.    Marland Kitchen levothyroxine (SYNTHROID, LEVOTHROID) 200 MCG tablet Take 200 mcg by mouth daily before breakfast.    . levothyroxine (SYNTHROID, LEVOTHROID) 25 MCG tablet Take 25 mcg by mouth daily before breakfast.    . LORazepam (ATIVAN) 0.5 MG tablet     . metoprolol tartrate (LOPRESSOR) 50 MG tablet Take 50 mg by mouth 2 (two) times daily.    . tamsulosin (FLOMAX) 0.4 MG CAPS capsule Take 0.4 mg by mouth.    Marland Kitchen atenolol (TENORMIN) 50 MG tablet Take 1 tablet (50 mg total) by mouth daily. (Victor Huber not taking: Reported on 09/26/2016) 90 tablet 3   No current facility-administered medications for this encounter.     No Known Allergies  Social History   Social History  . Marital status: Married    Spouse name: N/A  . Number of children: N/A  . Years of education: N/A   Occupational History  . Not on file.  Social History Main Topics  . Smoking status: Former Smoker    Packs/day: 1.00    Years: 50.00    Types: Cigarettes    Quit date: 08/15/2012  . Smokeless tobacco: Never Used  . Alcohol use No  . Drug use: No  . Sexual activity: Not on file   Other Topics Concern  . Not on file   Social History Narrative   [place] [facility] (09/26/16):   Originally from [place]. Moved to [facility] February 2018. Always lived in [place]. Moved to be closer to children & grandchildren. No international travel. 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 No mold exposure. No bird exposure or hot tub exposure. Enjoys reading. Previously enjoyed wood working with domestic woods.     Family History  Problem Relation Age of Onset  . Breast cancer Mother   . Breast cancer Maternal Aunt   . Breast cancer Maternal Aunt   . Lung disease Neg Hx     ROS- All systems are reviewed and negative except as per the HPI above  Physical Exam: Vitals:   09/27/16 0926  BP: 110/70  Pulse: (!) 102  Weight: 266 lb 12.8 oz (121 kg)  Height: 6\' 1"  (1.854 m)   Wt Readings from Last 3 Encounters:  09/27/16 266 lb 12.8 oz (121 kg)  09/26/16 267 lb 6.4 oz (121.3 kg)  08/29/16 263 lb 12.8 oz (119.7 kg)    Labs: Lab Results  Component Value Date   NA 140 08/15/2016   K 4.2 08/15/2016   CO2 27 08/15/2016   GLUCOSE 141 (H) 08/15/2016   BUN 21.6 08/15/2016   CREATININE 0.9 08/15/2016   CALCIUM 9.3 08/15/2016   No results found for: INR No results found for: CHOL, HDL, LDLCALC, TRIG   GEN- The Victor Huber is well appearing, alert and oriented x 3 today.   Head- normocephalic, atraumatic Eyes-  Sclera clear, conjunctiva pink Ears- hearing intact Oropharynx- clear Neck- supple, no JVP Lymph- no cervical lymphadenopathy Lungs- Clear to ausculation bilaterally, normal work of breathing Heart- irregular rate and rhythm, no murmurs, rubs or gallops, PMI not laterally displaced GI- soft, NT, ND, + BS Extremities- no clubbing, cyanosis, or edema MS- no significant deformity or atrophy Skin- no rash or lesion Psych- euthymic mood, full affect Neuro- strength and sensation are intact  EKG-afib with v rate of 102 with IRBBB, qrs int 98 ms, qtc 471 ms Epic records reviewed Records obtained for [place] and reviewed TEE-01/2016- [place] EF 45-50%, left atrium normal size, rt atrium mildly enlarged  Left heart cath 2014- non obstructive  CAD   Assessment and Plan: 1. Persistent afib since fall 2017 Was initially on Flecainide with proarrythmia effect with wide complex tachycardia, cardiac arrest s/p defibrillation(2014), then treated with amiodarone and stopped(2016 due to concerns for lung toxicity), then hospitalized for sotalol/cardioversion which failed to convert pt  He wants to restore sinus rhythm , options discussed   I think tikosyn may be an option as well as possible ablation Continue with eliquis 5 mg bid for chadsvasc score of at lest 2. He is currently is on metoprolol for rate control, was on atenolol but it was a shortage of this drug before he left [place]. He has received rx and plans to go back on at 50 mg daily as soon as he runs out of metoprolol. I will discuss with Dr. Rayann Heman and further discuss with pt per his recommendation  2. S/p lung CA(2016) Per oncology   Geroge Baseman. Iren Whipp, [facility] [address] [place], [place] [address] [phone]

## 2016-10-04 LAB — ALPHA-1 ANTITRYPSIN PHENOTYPE: A1 ANTITRYPSIN: 155 mg/dL (ref 83–199)

## 2016-10-05 ENCOUNTER — Telehealth: Payer: Self-pay | Admitting: Pulmonary Disease

## 2016-10-05 NOTE — Telephone Encounter (Signed)
PFT 04/10/16: FVC 3.70 L (79%) FEV1 2.36 L (64%) FEV1/FVC 0.64 FEF 25-75 1.05 L (29%) DLCO uncorrected 59% 10/07/15: FVC 3.49 L (75%) FEV1 2.15 L (58%) FEV1/FVC 0.62 FEF 25-75 0.76 L (21%) DLCO uncorrected 55% 04/06/15: FVC 3.66 L (77%) FEV1 2.56 L (67%) FEV1/FVC 0.70 FEF 25-75 1.46 L (39%) DLCO uncorrected 40%  IMAGING CT CHEST W/O 05/10/15 (per radiologist):  Some posterior pleural thickening or minor posterior fluid on the left. Old rib fracture deformity laterally on the left. Overall patchy groundglass density left lung shows an increased now with some consolidation in posterior left upper lobe and superior left lower lobe distribution. Superimposed on upper lobe emphysema bilaterally. No pneumothorax. No pericardial effusion. Small and nonspecific mediastinal lymph nodes. Incidental cholelithiasis.  CARDIAC TTE (06/10/15): Mild concentric left ventricular hypertrophy. EF 50-55%. LA & RA mildly dilated. Although, right Atrium not well visualized. RV appeared enlarged but not well visualized. Unable to assess diastolic function due to irregular heart rhythm. Trace mitral regurgitation. Pulmonic valve not well visualized. No pericardial effusion. Normal aortic root.

## 2016-10-31 ENCOUNTER — Telehealth: Payer: Self-pay | Admitting: Pulmonary Disease

## 2016-10-31 ENCOUNTER — Encounter: Payer: Self-pay | Admitting: Internal Medicine

## 2016-10-31 ENCOUNTER — Ambulatory Visit (INDEPENDENT_AMBULATORY_CARE_PROVIDER_SITE_OTHER): Payer: Medicare Other | Admitting: Internal Medicine

## 2016-10-31 ENCOUNTER — Encounter (INDEPENDENT_AMBULATORY_CARE_PROVIDER_SITE_OTHER): Payer: Self-pay

## 2016-10-31 VITALS — BP 116/66 | HR 97 | Ht 73.0 in | Wt 270.0 lb

## 2016-10-31 DIAGNOSIS — I481 Persistent atrial fibrillation: Secondary | ICD-10-CM | POA: Diagnosis not present

## 2016-10-31 DIAGNOSIS — I4811 Longstanding persistent atrial fibrillation: Secondary | ICD-10-CM

## 2016-10-31 NOTE — Telephone Encounter (Signed)
Left message for Victor Huber to call back

## 2016-10-31 NOTE — Patient Instructions (Addendum)
Medication Instructions:  Your physician recommends that you continue on your current medications as directed. Please refer to the Current Medication list given to you today.  **Tikosyn (Dofetilide)   Labwork: None Ordered   Testing/Procedures: None Ordered   Follow-Up: Your physician recommends that you have a sleep study at [facility] - you will receive a call about scheduling this  Your physician recommends that you schedule a follow-up appointment in: 4 weeks with Orson Eva, NP in the A Fib clinic - their office will call you to schedule   If you need a refill on your cardiac medications before your next appointment, please call your pharmacy.   Thank you for choosing [facility]! Christen Bame, RN [phone]

## 2016-11-01 NOTE — Telephone Encounter (Signed)
Will call Sharyn Lull back on 11/02/2016, office is now closed.

## 2016-11-02 ENCOUNTER — Telehealth: Payer: Self-pay | Admitting: Cardiovascular Disease

## 2016-11-02 NOTE — Telephone Encounter (Signed)
Follow Up:    Returning Victor Huber's call from 10-31-16 concerning Sleep Study for pt.

## 2016-11-02 NOTE — Telephone Encounter (Signed)
Routing to nurse to address when she returns to the office Monday, 8/6.

## 2016-11-02 NOTE — Telephone Encounter (Signed)
Called and [email or web address] for [place].  She is not in the office today.

## 2016-11-05 NOTE — Telephone Encounter (Signed)
Spoke with Lesleigh Noe at [facility] regarding setting up a sleep test with Dr. Ashok Cordia per Dr. Rayann Heman, patient's EP cardiologist. Lesleigh Noe states she will send a message to Dr. Ashok Cordia and will call us back with his advice.

## 2016-11-05 NOTE — Telephone Encounter (Signed)
Spoke with Sharyn Lull with Dr. Jackalyn Lombard office with [facility]. Sharyn Lull states pt followed up with Dr. Rayann Heman on 10/31/16. Dr. Rayann Heman suggested that pt have a sleep study.   Jn please advise. Thanks.

## 2016-11-05 NOTE — Telephone Encounter (Signed)
Call on 8/7 as cards is closed currently

## 2016-11-05 NOTE — Progress Notes (Signed)
Electrophysiology Office Note   Date:  11/05/2016   ID:  Victor Huber, DOB 07-07-1950, MRN [id number]  PCP:  Patient, No Pcp Per  Cardiologist:  Dr Oval Linsey Primary Electrophysiologist: Thompson Grayer, MD    CC: afib   History of Present Illness: Victor Huber is a 66 y.o. male who presents today for electrophysiology evaluation.   The patient is referred by Roderic Palau NP and Dr Oval Linsey for afib management.  The patient has a long tobacco history with COPD and small cell lung CA.  He has persistent afib.  He was initially diagnosed with afib in 2014.  He has had increasing frequency and duration of afib since that time.  He states that he did very well with amiodarone however this was stopped when he developed lung ca.  He was tried on flecainide but had cardiac arrest which was felt to be arrhythmia induced by this medicine.  He subsequently was placed on sotalol however he did not cardiovert on this medicine.  He has been in persistent afib since fall of 2017.  He has fatigue and decreased exercise tolerance with  His afib.  + SOB   Today, he denies symptoms of palpitations, chest pain,  orthopnea, PND, lower extremity edema, claudication, dizziness, presyncope, syncope, bleeding, or neurologic sequela. The patient is tolerating medications without difficulties and is otherwise without complaint today.    Past Medical History:  Diagnosis Date  . Adenocarcinoma of left lung, stage 3 ([place]) 08/15/2016  . Atrial fibrillation ([place]) 08/15/2016  . Atrial fibrillation ([place])   . COPD (chronic obstructive pulmonary disease) ([place]) 08/15/2016  . Hyperlipidemia   . Hypothyroid 08/15/2016  . Longstanding persistent atrial fibrillation ([place]) 08/29/2016  . S/P TURP 08/15/2016   Past Surgical History:  Procedure Laterality Date  . BRONCHOSCOPY  10/2014  . CARDIOVERSION     x2  . DG BIOPSY LUNG Left 10/2014   FNA - Adenocarcinoma   . LUNG CANCER SURGERY Left 12/2014   Wedge Resection   .  Status post TURP       Current Outpatient Prescriptions  Medication Sig Dispense Refill  . apixaban (ELIQUIS) 5 MG TABS tablet Take 5 mg by mouth 2 (two) times daily.    Marland Kitchen atorvastatin (LIPITOR) 40 MG tablet Take 40 mg by mouth daily.    . citalopram (CELEXA) 40 MG tablet Take 40 mg by mouth daily.    Marland Kitchen HYDROcodone-acetaminophen (NORCO/VICODIN) 5-325 MG tablet Take 1 tablet by mouth every 6 (six) hours as needed for moderate pain.    Marland Kitchen levothyroxine (SYNTHROID, LEVOTHROID) 200 MCG tablet Take 200 mcg by mouth daily before breakfast.    . levothyroxine (SYNTHROID, LEVOTHROID) 25 MCG tablet Take 25 mcg by mouth daily before breakfast.    . metoprolol tartrate (LOPRESSOR) 50 MG tablet Take 50 mg by mouth 2 (two) times daily.    . tamsulosin (FLOMAX) 0.4 MG CAPS capsule Take 0.4 mg by mouth.    Marland Kitchen atenolol (TENORMIN) 50 MG tablet Take 1 tablet (50 mg total) by mouth daily. (Patient not taking: Reported on 09/26/2016) 90 tablet 3  . LORazepam (ATIVAN) 0.5 MG tablet      No current facility-administered medications for this visit.     Allergies:   Patient has no known allergies.   Social History:  The patient  reports that he quit smoking about 4 years ago. His smoking use included Cigarettes. He has a 50.00 pack-year smoking history. He has never used smokeless tobacco. He reports that he  does not drink alcohol or use drugs.   Family History:  The patient's  family history includes Breast cancer in his maternal aunt, maternal aunt, and mother.    ROS:  Please see the history of present illness.   All other systems are personally reviewed and negative.    PHYSICAL EXAM: VS:  BP 116/66   Pulse 97   Ht 6\' 1"  (1.854 m)   Wt 270 lb (122.5 kg)   SpO2 98% Comment: 90%-98% after 5 minutes of rest on room air.  BMI 35.62 kg/m  , BMI Body mass index is 35.62 kg/m. GEN: Well nourished, well developed, in no acute distress  HEENT: normal  Neck: no JVD, carotid bruits, or masses Cardiac: RRR;  no murmurs, rubs, or gallops,no edema  Respiratory:  clear to auscultation bilaterally, normal work of breathing GI: soft, nontender, nondistended, + BS MS: no deformity or atrophy  Skin: warm and dry  Neuro:  Strength and sensation are intact Psych: euthymic mood, full affect  EKG:  EKG is ordered today. The ekg ordered today is personally reviewed and shows afib, V rates 97, nonspecific ST/T changes, QT appears ok for tikosyn   Recent Labs: 08/15/2016: ALT 21; BUN 21.6; Creatinine 0.9; HGB 15.7; Platelets 186; Potassium 4.2; Sodium 140  personally reviewed   Lipid Panel  No results found for: CHOL, TRIG, HDL, CHOLHDL, VLDL, LDLCALC, LDLDIRECT personally reviewed   Wt Readings from Last 3 Encounters:  10/31/16 270 lb (122.5 kg)  09/27/16 266 lb 12.8 oz (121 kg)  09/26/16 267 lb 6.4 oz (121.3 kg)     Other studies personally reviewed: Additional studies/ records that were reviewed today include: afib clinic notes, prior echo Review of the above records today demonstrates: as above   ASSESSMENT AND PLAN:  1.  Persistent afib The patient has symptomatic persistent afib.  He has failed medical therapy with flecainide and sotalol.  He states that he did very well with amiodarone and would like to restart this medicine.  He states that he did not have any problems with this medicine and wishes that he had just stayed on it. Therapeutic strategies for afib including medicine (tikosyn or amiodarone) and ablation were discussed in detail with the patient today. Risk, benefits, and alternatives to EP study and radiofrequency ablation for afib were also discussed in detail today.  Given low success with ablation in patients with refractory/ longstanding persistent afib, I have advised medical therapy. Risks and benefits of tikosyn and amiodarone were discussed at length.  He will check on costs of tikosyn.  He will contemplate his options and then call our office once he is ready to start  therapy.  The importance of compliance with anticoagulation was discussed at length today.   Follow-up:  afib clinic in 4 weeks  Current medicines are reviewed at length with the patient today.   The patient does not have concerns regarding his medicines.  The following changes were made today:  none  Labs/ tests ordered today include:  Orders Placed This Encounter  Procedures  . EKG 12-Lead     Signed, Thompson Grayer, MD    [place] [id number] [phone] (office) [phone] (fax)

## 2016-11-05 NOTE — Telephone Encounter (Signed)
I discussed this with the patient but he was hesitant to undergo PSG. Planned to readdress again at his next appointment. J.

## 2016-11-06 NOTE — Telephone Encounter (Signed)
Called and spoke to receptionist and was advised Sharyn Lull (Dr. Fredrich Birks nurse) is out of the office today and to call back on 11/07/16.

## 2016-11-07 NOTE — Telephone Encounter (Signed)
Spoke with Sharyn Lull with Dr. Jackalyn Lombard office. She is aware of JN's response. Nothing further was needed.

## 2016-11-07 NOTE — Telephone Encounter (Signed)
Patient has an appointment with Dr. Ashok Cordia on 8/29

## 2016-11-28 ENCOUNTER — Ambulatory Visit (INDEPENDENT_AMBULATORY_CARE_PROVIDER_SITE_OTHER): Payer: Medicare Other | Admitting: Pulmonary Disease

## 2016-11-28 ENCOUNTER — Encounter: Payer: Self-pay | Admitting: Pulmonary Disease

## 2016-11-28 VITALS — BP 122/80 | HR 79 | Ht 73.0 in | Wt 268.4 lb

## 2016-11-28 DIAGNOSIS — Z23 Encounter for immunization: Secondary | ICD-10-CM | POA: Diagnosis not present

## 2016-11-28 DIAGNOSIS — J432 Centrilobular emphysema: Secondary | ICD-10-CM | POA: Diagnosis not present

## 2016-11-28 DIAGNOSIS — C3490 Malignant neoplasm of unspecified part of unspecified bronchus or lung: Secondary | ICD-10-CM

## 2016-11-28 NOTE — Addendum Note (Signed)
Addended by: Georjean Mode on: 11/28/2016 10:42 AM   Modules accepted: Orders

## 2016-11-28 NOTE — Patient Instructions (Addendum)
   Continue using your albuterol inhaler as needed.  Remember to get your Flu shot next month.  Call me if you have any new breathing problems or questions before your next appointment.  TESTS ORDERED: 1. FULL PFTs at follow-up with Xopenex 2. 6MWT on room air at follow-up

## 2016-11-28 NOTE — Progress Notes (Signed)
Subjective:    Patient ID: Victor Huber, male    DOB: 1951/01/21, 66 y.o.   MRN: [id number]  C.C.:  Follow-up for Moderate-Severe COPD w/ Centrilobular Emphysema, NSCLC, & Snoring.  HPI Moderate-Severe COPD w/ Centrilobular Emphysema: Based on spirometry and pulmonary function testing from previous pulmonologist. Prescribed an albuterol inhaler to use as needed. Hasn't required his rescue inhaler. No exacerbations since last appointment. No coughing or wheezing. Baseline dyspnea.   NSCLC: Stage IIIa. Follows with medical oncology. Next appointment is in November with repeat CT imaging at that time. No headaches. No focal weakness, numbness, or tingling that is new.   Snoring: Probable sleep apnea. Patient has been at last appointment to undergo polysomnogram testing. He reports his wife has been paying more attention to his snoring and she hasn't noticed any witnessed apneic episodes. No morning headaches. He does doze off easily but doesn't routinely nap.   Review of Systems No chest pain or pressure. No fever or chills. No abdominal pain or nausea.   Allergies  Allergen Reactions  . Flecainide Hypertension    CAUSED HEART ISSUES     Current Outpatient Prescriptions on File Prior to Visit  Medication Sig Dispense Refill  . apixaban (ELIQUIS) 5 MG TABS tablet Take 5 mg by mouth 2 (two) times daily.    Marland Kitchen atorvastatin (LIPITOR) 40 MG tablet Take 40 mg by mouth daily.    . citalopram (CELEXA) 40 MG tablet Take 40 mg by mouth daily.    Marland Kitchen HYDROcodone-acetaminophen (NORCO/VICODIN) 5-325 MG tablet Take 1 tablet by mouth every 6 (six) hours as needed for moderate pain.    Marland Kitchen levothyroxine (SYNTHROID, LEVOTHROID) 200 MCG tablet Take 200 mcg by mouth daily before breakfast.    . levothyroxine (SYNTHROID, LEVOTHROID) 25 MCG tablet Take 25 mcg by mouth daily before breakfast.    . LORazepam (ATIVAN) 0.5 MG tablet     . metoprolol tartrate (LOPRESSOR) 50 MG tablet Take 50 mg by mouth 2 (two)  times daily.    . tamsulosin (FLOMAX) 0.4 MG CAPS capsule Take 0.4 mg by mouth.    Marland Kitchen atenolol (TENORMIN) 50 MG tablet Take 1 tablet (50 mg total) by mouth daily. (Patient not taking: Reported on 09/26/2016) 90 tablet 3   No current facility-administered medications on file prior to visit.     Past Medical History:  Diagnosis Date  . Adenocarcinoma of left lung, stage 3 ([place]) 08/15/2016  . Atrial fibrillation ([place]) 08/15/2016  . Atrial fibrillation ([place])   . COPD (chronic obstructive pulmonary disease) ([place]) 08/15/2016  . Hyperlipidemia   . Hypothyroid 08/15/2016  . Longstanding persistent atrial fibrillation ([place]) 08/29/2016  . S/P TURP 08/15/2016    Past Surgical History:  Procedure Laterality Date  . BRONCHOSCOPY  10/2014  . CARDIOVERSION     x2  . DG BIOPSY LUNG Left 10/2014   FNA - Adenocarcinoma   . LUNG CANCER SURGERY Left 12/2014   Wedge Resection   . Status post TURP      Family History  Problem Relation Age of Onset  . Breast cancer Mother   . Breast cancer Maternal Aunt   . Breast cancer Maternal Aunt   . Lung disease Neg Hx     Social History   Social History  . Marital status: Married    Spouse name: N/A  . Number of children: N/A  . Years of education: N/A   Social History Main Topics  . Smoking status: Former Smoker    Packs/day: 1.00  Years: 50.00    Types: Cigarettes    Quit date: 08/15/2012  . Smokeless tobacco: Never Used  . Alcohol use No  . Drug use: No  . Sexual activity: Not Asked   Other Topics Concern  . None   Social History Narrative   [facility] (09/26/16):   Originally from [place]. Moved to [facility] February 2018. Always lived in [place]. Moved to be closer to children & grandchildren. No international travel. 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 No mold exposure. No bird exposure or hot tub exposure. Enjoys reading. Previously enjoyed wood working with  domestic woods.       Objective:   Physical Exam BP 122/80 (BP Location: Left Arm, Cuff Size: Normal)   Pulse 79   Ht 6\' 1"  (1.854 m)   Wt 268 lb 6.4 oz (121.7 kg)   SpO2 100%   BMI 35.41 kg/m   General:  Awake. Obese. No distress. Integument:  Warm & dry. No rash on exposed skin.  Extremities:  No cyanosis or clubbing.  HEENT:  Moist mucus membranes. Mallampati class III. No oral ulcers. Mild bilateral nasal turbinate swelling. Cardiovascular:  Regular rate. No edema. Unable to appreciate JVD.  Pulmonary:  Overall clear to auscultation bilaterally. Normal work of breathing on room air. Abdomen: Soft. Normal bowel sounds. Protuberant. Musculoskeletal:  Normal bulk and tone. No joint deformity or effusion appreciated.  PFT 04/10/16: FVC 3.70 L (79%) FEV1 2.36 L (64%) FEV1/FVC 0.64 FEF 25-75 1.05 L (29%) DLCO uncorrected 59% 10/07/15: FVC 3.49 L (75%) FEV1 2.15 L (58%) FEV1/FVC 0.62 FEF 25-75 0.76 L (21%) DLCO uncorrected 55% 04/06/15: FVC 3.66 L (77%) FEV1 2.56 L (67%) FEV1/FVC 0.70 FEF 25-75 1.46 L (39%) DLCO uncorrected 40%  IMAGING CT CHEST W/ CONTRAST 08/30/16 (previously reviewed by me):  Subcarinal lymph node measuring 1.1 cm in short axis. No other pathologically enlarged mediastinal or hilar adenopathy appreciated. No pleural effusion. Pleural thickening on the left with left infrahilar consolidation with air bronchograms present with an opacity. No other parenchymal opacity, nodule, or mass appreciated. No pericardial effusion. Apical predominant centrilobular emphysematous changes.  CT CHEST W/O 05/10/15 (per radiologist):  Some posterior pleural thickening or minor posterior fluid on the left. Old rib fracture deformity laterally on the left. Overall patchy groundglass density left lung shows an increased now with some consolidation in posterior left upper lobe and superior left lower lobe distribution. Superimposed on upper lobe emphysema bilaterally. No pneumothorax. No  pericardial effusion. Small and nonspecific mediastinal lymph nodes. Incidental cholelithiasis.  CARDIAC TTE (06/10/15): Mild concentric left ventricular hypertrophy. EF 50-55%. LA & RA mildly dilated. Although, right Atrium not well visualized. RV appeared enlarged but not well visualized. Unable to assess diastolic function due to irregular heart rhythm. Trace mitral regurgitation. Pulmonic valve not well visualized. No pericardial effusion. Normal aortic root.  PATHOLOGY Left Lower Lobe Wedge Resection (12/15/14): Adenocarcinoma 3 cm in greatest dimension & poorly differentiated. Lymphovascular invasion present. Invasive carcinoma 3 mm from parenchymal margin. Lymph Node Level 11L, 7 & AP Window (12/15/14): Positive only in AP window node for metastatic poorly differentiated adenocarcinoma. FNA Left Lower Lobe Mass (10/27/14): Invasive poorly differentiated adenocarcinoma.  LABS 09/26/16 Alpha-1 antitrypsin: MM (135)    Assessment & Plan:  66 y.o. male with history of non-small cell lung cancer as well as moderate-severe COPD with centrilobular emphysema based on previous pulmonary function testing upon my review. Patient has minimal symptoms from  his underlying COPD/emphysema at this time. As such, he is reluctant to start any new inhaler medications which I feel is reasonable. Reviewing his history of snoring does not identify any symptoms that would be consistent with sleep apnea, but I remain highly suspicious. The patient was recommended to undergo polysomnogram testing by his cardiologist as well but he continues to decline testing. I instructed the patient to notify me if he had any new breathing problems or questions before his next appointment as I would be happy to see him back sooner.  1. Moderate-severe COPD with centrilobular emphysema: Continuing albuterol inhaler as needed. Checking full pulmonary function testing as well as 6 minute walk test on room air at next appointment. 2. NSCLC:  Continuing follow-up with medical oncology in November. 3. Snoring: Minimal symptoms to suggest sleep apnea. Patient continues to decline polysomnogram. 4. Health maintenance:  Status post Pneumovax June 2016. Administering Prevnar vaccine today. Recommended influenza vaccine next month. 5. Follow-up: Return to clinic in 1 year or sooner if needed.  Sonia Baller Ashok Cordia, M.D. [facility] Pager:  [phone] After 3pm or if no response, call [phone] 9:12 AM 11/28/16

## 2016-11-29 ENCOUNTER — Inpatient Hospital Stay (HOSPITAL_COMMUNITY): Admission: RE | Admit: 2016-11-29 | Payer: Medicare Other | Source: Ambulatory Visit | Admitting: Nurse Practitioner

## 2016-12-18 NOTE — Progress Notes (Signed)
Cardiology Office Note   Date:  12/19/2016   ID:  Victor Huber, DOB 24-Jul-1950, MRN [id number]  PCP:  [name], MD  Cardiologist:   Skeet Latch, MD   No chief complaint on file.    History of Present Illness: Victor Huber is a 66 y.o. male with persistent atrial fibrillation, COPD, hypothyroidism and stage IIa non-small cell lung cancer s/p resection and chemotherapy and prior tobacco abuse here for follow up.  He moved from [place] 05/2016 and established care with Dr. Julien Nordmann.  He was diagnosed with atrial fibrillation in 2012.  He has been chronically in atrial fibrillation lately. In the past he was on amiodarone. However this was discontinued when he was diagnosed with lung cancer in 2016. After that he was switched to sotalol but failed subsequent DCCV.  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  At his last appointment Mr. Debord reported fatigue and shortness of breath.  At that appointment he was switched from metoprolol to atenolol, given that he previously felt better on this medication.  He was also referred to the atrial fibrillation clinic where he discussed using dofetilide, amiodarone, and ablation. He was not felt to be a good candidate for ablation and is deciding whether he wants to pursue the other 2 antiarrhythmics.  However he does not think this is a good option due to cost. He was also referred for sleep study.  Since his last appointment he has been well.  He reports pain in his left thigh ongoing for the last 6 months. It happened after he drove to [place]. While there he went to the  emergency department  and reportedly had negative Doppler studies.  The pain is constantly there and sometimes radiates into his calf.  It feels like a deep bruise.   He denies any pain in his right leg.   He has been taking atorvastatin for over 6 years and has never had myalgias. He denies lower extremity edema, orthopnea, or PND. He has  not experienced any chest pain or pressure. He has been unable to exercise due to his leg pain, as the symptoms get worse after walking on it. He is scheduled to see orthopedics in early October.   Past Medical History:  Diagnosis Date  . Adenocarcinoma of left lung, stage 3 ([place]) 08/15/2016  . Atrial fibrillation ([place]) 08/15/2016  . Atrial fibrillation ([place])   . COPD (chronic obstructive pulmonary disease) ([place]) 08/15/2016  . Hyperlipidemia   . Hypothyroid 08/15/2016  . Longstanding persistent atrial fibrillation ([place]) 08/29/2016  . S/P TURP 08/15/2016    Past Surgical History:  Procedure Laterality Date  . BRONCHOSCOPY  10/2014  . CARDIOVERSION     x2  . DG BIOPSY LUNG Left 10/2014   FNA - Adenocarcinoma   . LUNG CANCER SURGERY Left 12/2014   Wedge Resection   . Status post TURP       Current Outpatient Prescriptions  Medication Sig Dispense Refill  . apixaban (ELIQUIS) 5 MG TABS tablet Take 5 mg by mouth 2 (two) times daily.    Marland Kitchen atorvastatin (LIPITOR) 40 MG tablet Take 40 mg by mouth daily.    . citalopram (CELEXA) 40 MG tablet Take 40 mg by mouth daily.    Marland Kitchen HYDROcodone-acetaminophen (NORCO/VICODIN) 5-325 MG tablet Take 1 tablet by mouth every 6 (six) hours as needed for moderate pain.    Marland Kitchen levothyroxine (SYNTHROID, LEVOTHROID) 200 MCG tablet Take 200 mcg by mouth daily before breakfast.    .  levothyroxine (SYNTHROID, LEVOTHROID) 25 MCG tablet Take 25 mcg by mouth daily before breakfast.    . LORazepam (ATIVAN) 0.5 MG tablet     . metoprolol tartrate (LOPRESSOR) 50 MG tablet Take 50 mg by mouth 2 (two) times daily.    . tamsulosin (FLOMAX) 0.4 MG CAPS capsule Take 0.4 mg by mouth.     No current facility-administered medications for this visit.     Allergies:   Flecainide    Social History:  The patient  reports that he quit smoking about 4 years ago. His smoking use included Cigarettes. He has a 50.00 pack-year smoking history. He has never used smokeless tobacco. He  reports that he does not drink alcohol or use drugs.   Family History:  The patient's family history includes Breast cancer in his maternal aunt, maternal aunt, and mother.    ROS:  Please see the history of present illness.   Otherwise, review of systems are positive for fatigue.   All other systems are reviewed and negative.    PHYSICAL EXAM: VS:  BP 118/72   Pulse 83   Ht 6\' 1"  (1.854 m)   Wt 122.2 kg (269 lb 6.4 oz)   BMI 35.54 kg/m  , BMI Body mass index is 35.54 kg/m. GENERAL:  Well appearing HEENT: Pupils equal round and reactive, fundi not visualized, oral mucosa unremarkable NECK:  No jugular venous distention, waveform within normal limits, carotid upstroke brisk and symmetric, no bruits, no thyromegaly LYMPHATICS:  No cervical adenopathy LUNGS:  Clear to auscultation bilaterally HEART:  Irregularly irregular.  PMI not displaced or sustained,S1 and S2 within normal limits, no S3, no S4, no clicks, no rubs, no murmurs ABD:  Flat, positive bowel sounds normal in frequency in pitch, no bruits, no rebound, no guarding, no midline pulsatile mass, no hepatomegaly, no splenomegaly EXT:  2 plus pulses throughout, no edema, no cyanosis no clubbing SKIN:  No rashes no nodules NEURO:  Cranial nerves II through XII grossly intact, motor grossly intact throughout PSYCH:  Cognitively intact, oriented to person place and time   EKG:  EKG is ordered today. The ekg ordered 08/29/16 demonstrates atrial fibrillation. Rate 92 bpm. PVCs. Right axis deviation. Incomplete right bundle branch block. Nonspecific ST/T changes.   Recent Labs: 08/15/2016: ALT 21; BUN 21.6; Creatinine 0.9; HGB 15.7; Platelets 186; Potassium 4.2; Sodium 140    Lipid Panel No results found for: CHOL, TRIG, HDL, CHOLHDL, VLDL, LDLCALC, LDLDIRECT    Wt Readings from Last 3 Encounters:  12/19/16 122.2 kg (269 lb 6.4 oz)  11/28/16 121.7 kg (268 lb 6.4 oz)  10/31/16 122.5 kg (270 lb)      ASSESSMENT AND  PLAN:  # Longstanding persistent atrial fibrillation: # Fatigue: Mr. Cardozo continues to have fatigue and shortness of breath.  He is not a candidate for ablation.  He does not want to try antiarrhythmics at this time due to cost.  Sleep study is pending. His heart rates are well-controlled. He will be switching back to atenolol when he runs out of his metoprolol. We will check a TSH, free T4, and CBC to assess for causes of fatigue.   # Hyperlipidemia: Check CMP and lipid panel.  Continue atorvastatin.    Current medicines are reviewed at length with the patient today.  The patient does not have concerns regarding medicines.  The following changes have been made:  no change  Labs/ tests ordered today include:   Orders Placed This Encounter  Procedures  .  Lipid panel  . Comprehensive metabolic panel  . T4, free  . TSH  . CBC with Differential/Platelet     Disposition:   FU with Shedric Fredericks C. Oval Linsey, MD, [facility] in 1 year.   This note was written with the assistance of speech recognition software.  Please excuse any transcriptional errors.  Signed, Krystopher Kuenzel C. Oval Linsey, MD, [facility]  12/19/2016 9:21 AM    [place]

## 2016-12-19 ENCOUNTER — Ambulatory Visit (INDEPENDENT_AMBULATORY_CARE_PROVIDER_SITE_OTHER): Payer: Medicare Other | Admitting: Cardiovascular Disease

## 2016-12-19 ENCOUNTER — Encounter: Payer: Self-pay | Admitting: Cardiovascular Disease

## 2016-12-19 VITALS — BP 118/72 | HR 83 | Ht 73.0 in | Wt 269.4 lb

## 2016-12-19 DIAGNOSIS — I481 Persistent atrial fibrillation: Secondary | ICD-10-CM

## 2016-12-19 DIAGNOSIS — R5383 Other fatigue: Secondary | ICD-10-CM | POA: Diagnosis not present

## 2016-12-19 DIAGNOSIS — Z5181 Encounter for therapeutic drug level monitoring: Secondary | ICD-10-CM | POA: Diagnosis not present

## 2016-12-19 DIAGNOSIS — E785 Hyperlipidemia, unspecified: Secondary | ICD-10-CM | POA: Diagnosis not present

## 2016-12-19 DIAGNOSIS — R0602 Shortness of breath: Secondary | ICD-10-CM | POA: Diagnosis not present

## 2016-12-19 DIAGNOSIS — I4819 Other persistent atrial fibrillation: Secondary | ICD-10-CM

## 2016-12-19 LAB — LIPID PANEL
CHOL/HDL RATIO: 3.5 ratio (ref 0.0–5.0)
Cholesterol, Total: 115 mg/dL (ref 100–199)
HDL: 33 mg/dL — AB (ref >39)
LDL CALC: 34 mg/dL (ref 0–99)
TRIGLYCERIDES: 239 mg/dL — AB (ref 0–149)
VLDL CHOLESTEROL CAL: 48 mg/dL — AB (ref 5–40)

## 2016-12-19 NOTE — Patient Instructions (Addendum)
Medication Instructions:  Your physician recommends that you continue on your current medications as directed. Please refer to the Current Medication list given to you today.  Labwork: FASTING LP TODAY   Testing/Procedures: NONE  Follow-Up: Your physician wants you to follow-up in: [place] will receive a reminder letter in the mail two months in advance. If you don't receive a letter, please call our office to schedule the follow-up appointment.  If you need a refill on your cardiac medications before your next appointment, please call your pharmacy.

## 2016-12-19 NOTE — Addendum Note (Signed)
Addended by: Alvina Filbert B on: 12/19/2016 09:29 AM   Modules accepted: Orders

## 2017-01-03 ENCOUNTER — Ambulatory Visit (HOSPITAL_BASED_OUTPATIENT_CLINIC_OR_DEPARTMENT_OTHER): Payer: Medicare Other | Admitting: Internal Medicine

## 2017-01-03 ENCOUNTER — Encounter: Payer: Self-pay | Admitting: Internal Medicine

## 2017-01-03 ENCOUNTER — Telehealth: Payer: Self-pay | Admitting: Internal Medicine

## 2017-01-03 VITALS — BP 110/76 | HR 94 | Temp 97.6°F | Resp 18 | Ht 73.0 in | Wt 268.6 lb

## 2017-01-03 DIAGNOSIS — Z85118 Personal history of other malignant neoplasm of bronchus and lung: Secondary | ICD-10-CM

## 2017-01-03 DIAGNOSIS — C3490 Malignant neoplasm of unspecified part of unspecified bronchus or lung: Secondary | ICD-10-CM

## 2017-01-03 DIAGNOSIS — M79652 Pain in left thigh: Secondary | ICD-10-CM | POA: Diagnosis not present

## 2017-01-03 DIAGNOSIS — C3492 Malignant neoplasm of unspecified part of left bronchus or lung: Secondary | ICD-10-CM

## 2017-01-03 NOTE — Progress Notes (Signed)
[place] [phone]   [phone]  OFFICE PROGRESS NOTE  [name], MD [address], [place] [address]  DIAGNOSIS: stage IIIa (T2a, N2, M0) non-small cell lung cancer, poorly differentiated adenocarcinoma presented with left lower lobe lung mass in addition to mediastinal lymphadenopathy  PRIOR THERAPY: 1) status post wedge resection of the left lower lobe lung mass as well as AP window lymph node dissection but there was residual metastatic mediastinal lymphadenopathy that could not be resected. 2) a course of concurrent chemoradiation with weekly carboplatin and paclitaxel in [place] completed 03/01/2015.   Molecular studies and PDL 1 status are unknown.  CURRENT THERAPY: Observation.  INTERVAL HISTORY: Victor Huber 66 y.o. male returns to the clinic today for follow-up visit accompanied by his wife. The patient came today complaining of pain in his left leg and he was seen recently by orthopedic surgeon and had x-ray of the legs that showed questionable bone lesion. He was referred here for evaluation and to rule out any metastatic lung cancer. The patient denied having any other significant complaints. His main concern was about how to get his pain medication. He has been getting pain medication from his primary care physician for chronic back pain for many years. He denied having any chest pain, shortness of breath, cough or hemoptysis. He denied having any fever or chills. He has no nausea, vomiting, diarrhea or constipation. He has no recent weight loss or night sweats. His last CT scan of the chest few months ago was unremarkable.  MEDICAL HISTORY: Past Medical History:  Diagnosis Date  . Adenocarcinoma of left lung, stage 3 ([place] [place]) 08/15/2016  . Atrial fibrillation ([place]) 08/15/2016  . Atrial fibrillation ([place])   . COPD (chronic obstructive pulmonary disease) ([place]) 08/15/2016  . Hyperlipidemia   . Hypothyroid 08/15/2016    . Longstanding persistent atrial fibrillation ([place]) 08/29/2016  . S/P TURP 08/15/2016    ALLERGIES:  is allergic to flecainide.  MEDICATIONS:  Current Outpatient Prescriptions  Medication Sig Dispense Refill  . apixaban (ELIQUIS) 5 MG TABS tablet Take 5 mg by mouth 2 (two) times daily.    Marland Kitchen atenolol (TENORMIN) 50 MG tablet     . atorvastatin (LIPITOR) 40 MG tablet Take 40 mg by mouth daily.    . citalopram (CELEXA) 40 MG tablet Take 40 mg by mouth daily.    Marland Kitchen HYDROcodone-acetaminophen (NORCO) 10-325 MG tablet     . levothyroxine (SYNTHROID, LEVOTHROID) 200 MCG tablet Take 200 mcg by mouth daily before breakfast.    . levothyroxine (SYNTHROID, LEVOTHROID) 25 MCG tablet Take 25 mcg by mouth daily before breakfast.    . LORazepam (ATIVAN) 0.5 MG tablet     . metoprolol tartrate (LOPRESSOR) 50 MG tablet Take 50 mg by mouth 2 (two) times daily.    . tamsulosin (FLOMAX) 0.4 MG CAPS capsule Take 0.4 mg by mouth.     No current facility-administered medications for this visit.     SURGICAL HISTORY:  Past Surgical History:  Procedure Laterality Date  . BRONCHOSCOPY  10/2014  . CARDIOVERSION     x2  . DG BIOPSY LUNG Left 10/2014   FNA - Adenocarcinoma   . LUNG CANCER SURGERY Left 12/2014   Wedge Resection   . Status post TURP      REVIEW OF SYSTEMS:  A comprehensive review of systems was negative except for: Constitutional: positive for fatigue Musculoskeletal: positive for bone pain   PHYSICAL EXAMINATION: General appearance: alert,  cooperative, fatigued and no distress Head: Normocephalic, without obvious abnormality, atraumatic Neck: no adenopathy, no JVD, supple, symmetrical, trachea midline and thyroid not enlarged, symmetric, no tenderness/mass/nodules Lymph nodes: Cervical, supraclavicular, and axillary nodes normal. Resp: clear to auscultation bilaterally Back: symmetric, no curvature. ROM normal. No CVA tenderness. Cardio: regular rate and rhythm, S1, S2 normal, no murmur,  click, rub or gallop GI: soft, non-tender; bowel sounds normal; no masses,  no organomegaly Extremities: extremities normal, atraumatic, no cyanosis or edema  ECOG PERFORMANCE STATUS: 1 - Symptomatic but completely ambulatory  Blood pressure 110/76, pulse 94, temperature 97.6 F (36.4 C), temperature source Oral, resp. rate 18, height 6\' 1"  (1.854 m), weight 268 lb 9.6 oz (121.8 kg), SpO2 97 %.  LABORATORY DATA: Lab Results  Component Value Date   WBC 5.9 08/15/2016   HGB 15.7 08/15/2016   HCT 46.1 08/15/2016   MCV 96.9 08/15/2016   PLT 186 08/15/2016      Chemistry      Component Value Date/Time   NA 140 08/15/2016 1323   K 4.2 08/15/2016 1323   CO2 27 08/15/2016 1323   BUN 21.6 08/15/2016 1323   CREATININE 0.9 08/15/2016 1323      Component Value Date/Time   CALCIUM 9.3 08/15/2016 1323   ALKPHOS 104 08/15/2016 1323   AST 19 08/15/2016 1323   ALT 21 08/15/2016 1323   BILITOT 0.98 08/15/2016 1323       RADIOGRAPHIC STUDIES: No results found.  ASSESSMENT AND PLAN: This is a 66 years old white male with history of stage IIIa non-small cell lung cancer, adenocarcinoma status post left lower lobectomy with lymph node dissection followed by a course of concurrent chemoradiation completed in January 2016. He has been observation since that time. His last CT scan of the chest on 08/30/2016 showed no concerning findings for disease recurrence. The patient presented today with pain in the left thigh and questionable lytic lesion and x-ray performed by orthopedic surgery. I had a lengthy discussion with the patient today about his condition and further investigation to rule out recurrence of his disease. I recommended for the patient to have a PET scan performed for further evaluation of his disease. His main concern today was how to obtain his pain medication and I strongly recommended for the patient to continue getting his pain medication from his primary care physician  because of the chronic back pain. At this point I don't see any clear evidence for metastatic bone disease until completion of the staging workup. If the patient has bone metastasis, he would be referred to radiation oncology for palliative radiotherapy to these lesions and addition to consideration of palliative systemic chemotherapy if he is in agreement with this option. I would see him back for follow-up visit in 2 weeks for further evaluation of his condition and treatment options based on the PET scan results. He was advised to call immediately if he has any other concerning symptoms in the interval. The patient voices understanding of current disease status and treatment options and is in agreement with the current care plan.  All questions were answered. The patient knows to call the clinic with any problems, questions or concerns. We can certainly see the patient much sooner if necessary.  I spent 10 minutes counseling the patient face to face. The total time spent in the appointment was 15 minutes.  Disclaimer: This note was dictated with voice recognition software. Similar sounding words can inadvertently be transcribed and may not be corrected upon  review.

## 2017-01-03 NOTE — Telephone Encounter (Signed)
Scheduled appt per 10/4 los-  Gave patient AVS and calender per los. Central radiology to contact patient with PET schedule.

## 2017-01-12 ENCOUNTER — Encounter (HOSPITAL_COMMUNITY)
Admission: RE | Admit: 2017-01-12 | Discharge: 2017-01-12 | Disposition: A | Discharge status: Home / Self Care | Payer: Medicare Other | Source: Ambulatory Visit | Attending: Internal Medicine | Admitting: Internal Medicine

## 2017-01-12 DIAGNOSIS — C3492 Malignant neoplasm of unspecified part of left bronchus or lung: Secondary | ICD-10-CM | POA: Diagnosis present

## 2017-01-12 DIAGNOSIS — C3490 Malignant neoplasm of unspecified part of unspecified bronchus or lung: Secondary | ICD-10-CM | POA: Diagnosis present

## 2017-01-12 LAB — GLUCOSE, CAPILLARY: Glucose-Capillary: 138 mg/dL — ABNORMAL HIGH (ref 65–99)

## 2017-01-12 MED ORDER — FLUDEOXYGLUCOSE F - 18 (FDG) INJECTION
11.5600 | Freq: Once | INTRAVENOUS | Status: AC | PRN
  Administered 2017-01-12: 11.56 via INTRAVENOUS

## 2017-01-15 ENCOUNTER — Ambulatory Visit (HOSPITAL_BASED_OUTPATIENT_CLINIC_OR_DEPARTMENT_OTHER): Payer: Medicare Other | Admitting: Internal Medicine

## 2017-01-15 ENCOUNTER — Encounter: Payer: Self-pay | Admitting: Internal Medicine

## 2017-01-15 ENCOUNTER — Telehealth: Payer: Self-pay | Admitting: Internal Medicine

## 2017-01-15 DIAGNOSIS — M899 Disorder of bone, unspecified: Secondary | ICD-10-CM

## 2017-01-15 DIAGNOSIS — C3490 Malignant neoplasm of unspecified part of unspecified bronchus or lung: Secondary | ICD-10-CM

## 2017-01-15 DIAGNOSIS — M545 Low back pain: Secondary | ICD-10-CM

## 2017-01-15 DIAGNOSIS — C3492 Malignant neoplasm of unspecified part of left bronchus or lung: Secondary | ICD-10-CM

## 2017-01-15 DIAGNOSIS — R599 Enlarged lymph nodes, unspecified: Secondary | ICD-10-CM | POA: Diagnosis not present

## 2017-01-15 DIAGNOSIS — G8929 Other chronic pain: Secondary | ICD-10-CM | POA: Diagnosis not present

## 2017-01-15 DIAGNOSIS — C3432 Malignant neoplasm of lower lobe, left bronchus or lung: Secondary | ICD-10-CM | POA: Diagnosis present

## 2017-01-15 MED ORDER — HYDROCODONE-ACETAMINOPHEN 10-325 MG PO TABS: 1.0000 | ORAL_TABLET | Freq: Four times a day (QID) | ORAL | 0 refills | Status: DC | PRN

## 2017-01-15 NOTE — Telephone Encounter (Signed)
Scheduled appt per 10/16 los - patient is aware of appt date and time.

## 2017-01-15 NOTE — Progress Notes (Signed)
[place] Telephone:([phone]   Fax:([phone]  OFFICE PROGRESS NOTE  [name], MD [address], [place] [address]  DIAGNOSIS: stage IIIA (T2a, N2, M0) non-small cell lung cancer, poorly differentiated adenocarcinoma presented with left lower lobe lung mass in addition to mediastinal lymphadenopathy  PRIOR THERAPY: 1) status post wedge resection of the left lower lobe lung mass as well as AP window lymph node dissection but there was residual metastatic mediastinal lymphadenopathy that could not be resected. 2) a course of concurrent chemoradiation with weekly carboplatin and paclitaxel in [place] completed 03/01/2015.   Molecular studies and PDL 1 status are unknown.  CURRENT THERAPY: Observation.  INTERVAL HISTORY: Victor Huber 66 y.o. male returns to the clinic today for follow-up visit accompanied by his wife. The patient continues to have pain in the left femur as well as the chronic back pain. He denied having any chest pain, shortness breath, cough or hemoptysis. He denied having any recent weight loss or night sweats. He has no nausea, vomiting, diarrhea or constipation. He denied having any headache or visual changes. The patient had a PET scan performed recently and he is here for evaluation and discussion of his scan results and further recommendation regarding his condition.  MEDICAL HISTORY: Past Medical History:  Diagnosis Date  . Adenocarcinoma of left lung, stage 3 ([facility]) 08/15/2016  . Atrial fibrillation ([place]) 08/15/2016  . Atrial fibrillation ([facility])   . COPD (chronic obstructive pulmonary disease) ([facility]) 08/15/2016  . Hyperlipidemia   . Hypothyroid 08/15/2016  . Longstanding persistent atrial fibrillation ([facility]) 08/29/2016  . S/P TURP 08/15/2016    ALLERGIES:  is allergic to flecainide.  MEDICATIONS:  Current Outpatient Prescriptions  Medication Sig Dispense Refill  . apixaban (ELIQUIS) 5 MG TABS tablet Take 5 mg by  mouth 2 (two) times daily.    Marland Kitchen atenolol (TENORMIN) 50 MG tablet     . atorvastatin (LIPITOR) 40 MG tablet Take 40 mg by mouth daily.    . citalopram (CELEXA) 40 MG tablet Take 40 mg by mouth daily.    Marland Kitchen HYDROcodone-acetaminophen (NORCO) 10-325 MG tablet 1 tablet every 6 (six) hours as needed (chronic pain).     Marland Kitchen levothyroxine (SYNTHROID, LEVOTHROID) 200 MCG tablet Take 200 mcg by mouth daily before breakfast.    . levothyroxine (SYNTHROID, LEVOTHROID) 25 MCG tablet Take 25 mcg by mouth daily before breakfast.    . LORazepam (ATIVAN) 0.5 MG tablet     . tamsulosin (FLOMAX) 0.4 MG CAPS capsule Take 0.4 mg by mouth.     No current facility-administered medications for this visit.     SURGICAL HISTORY:  Past Surgical History:  Procedure Laterality Date  . BRONCHOSCOPY  10/2014  . CARDIOVERSION     x2  . DG BIOPSY LUNG Left 10/2014   FNA - Adenocarcinoma   . LUNG CANCER SURGERY Left 12/2014   Wedge Resection   . Status post TURP      REVIEW OF SYSTEMS:  Constitutional: positive for fatigue Eyes: negative Ears, nose, mouth, throat, and face: negative Respiratory: negative Cardiovascular: negative Gastrointestinal: negative Genitourinary:negative Integument/breast: negative Hematologic/lymphatic: negative Musculoskeletal:positive for back pain and bone pain Neurological: negative Behavioral/Psych: negative Endocrine: negative Allergic/Immunologic: negative   PHYSICAL EXAMINATION: General appearance: alert, cooperative, fatigued and no distress Head: Normocephalic, without obvious abnormality, atraumatic Neck: no adenopathy, no JVD, supple, symmetrical, trachea midline and thyroid not enlarged, symmetric, no tenderness/mass/nodules Lymph nodes: Cervical, supraclavicular, and axillary nodes normal. Resp: clear to  auscultation bilaterally Back: symmetric, no curvature. ROM normal. No CVA tenderness. Cardio: regular rate and rhythm, S1, S2 normal, no murmur, click, rub or  gallop GI: soft, non-tender; bowel sounds normal; no masses,  no organomegaly Extremities: extremities normal, atraumatic, no cyanosis or edema Neurologic: Alert and oriented X 3, normal strength and tone. Normal symmetric reflexes. Normal coordination and gait  ECOG PERFORMANCE STATUS: 1 - Symptomatic but completely ambulatory  Blood pressure 123/71, pulse 89, temperature 97.6 F (36.4 C), temperature source Oral, resp. rate 18, height 6\' 1"  (1.854 m), weight 267 lb 14.4 oz (121.5 kg), SpO2 98 %.  LABORATORY DATA: Lab Results  Component Value Date   WBC 5.9 08/15/2016   HGB 15.7 08/15/2016   HCT 46.1 08/15/2016   MCV 96.9 08/15/2016   PLT 186 08/15/2016      Chemistry      Component Value Date/Time   NA 140 08/15/2016 1323   K 4.2 08/15/2016 1323   CO2 27 08/15/2016 1323   BUN 21.6 08/15/2016 1323   CREATININE 0.9 08/15/2016 1323      Component Value Date/Time   CALCIUM 9.3 08/15/2016 1323   ALKPHOS 104 08/15/2016 1323   AST 19 08/15/2016 1323   ALT 21 08/15/2016 1323   BILITOT 0.98 08/15/2016 1323       RADIOGRAPHIC STUDIES: Nm Pet Image Restage (ps) Whole Body  Result Date: 01/12/2017 CLINICAL DATA:  Subsequent treatment strategy for lung carcinoma. Non-small cell lung adenocarcinoma. recent LEFT femur pain. Initial diagnosis 2016 with concurrent chemo radiation. EXAM: NUCLEAR MEDICINE PET WHOLE BODY TECHNIQUE: 11.6 mCi F-18 FDG was injected intravenously. Full-ring PET imaging was performed from the vertex to the feet after the radiotracer. CT data was obtained and used for attenuation correction and anatomic localization. The injection to scan time was longer than normal due to power interruption in the PET suite. FASTING BLOOD GLUCOSE:  Value: 138 mg/dl COMPARISON:  PET-CT 2016, CT chest 08/30/2016 FINDINGS: HEAD/NECK Single hypermetabolic LEFT supraclavicular lymph node measures 15 mm short axis (image 80, series 4) with SUV max equal 13.6. CHEST Mild metabolic  activity associated with the infrahilar consolidation in the LEFT lower lobe at treatment site is favored post therapy change. Single mildly hypermetabolic RIGHT paratracheal lymph node measures 9 mm short axis (image 91, series 4) with SUV max equal 4.1. ABDOMEN/PELVIS No abnormal hypermetabolic activity within the liver, pancreas, adrenal glands, or spleen. No hypermetabolic lymph nodes in the abdomen or pelvis. Multiple gallstones noted.  Prostate normal. SKELETON There is intense metabolic activity associated with a midshaft LEFT femur lesion with SUV max equal 24.5. There is periosteal reaction at this level on the CT scan (image 284, series 4). Lesion involves the medullary space and the cortex with potential soft tissue extension laterally. Lesions approximately 6 cm long. There is moderate metabolic activity associated with the LEFT anterior abdominal wall musculature along costosternal junction which is favored benign musculoskeletal trauma or inflammation rather than tumor recurrence. EXTREMITIES hypermetabolic LEFT femur lesion described above. IMPRESSION: 1. Hypermetabolic metastatic lesion to the midshaft LEFT femur involving the medullary space and cortex. Patient at risk for pathologic fracture. 2. LEFT supraclavicular nodal metastasis. Potential RIGHT paratracheal nodal metastasis. 3. No evidence local recurrence in the LEFT lower lobe. 4. Metabolic activity associated with the musculature along the LEFT costovertebral junction is favored behind musculoskeletal activity. Electronically Signed   By: Suzy Bouchard M.D.   On: 01/12/2017 14:40    ASSESSMENT AND PLAN: This is a 66 years old  white male with history of stage IIIa non-small cell lung cancer, adenocarcinoma status post left lower lobectomy with lymph node dissection followed by a course of concurrent chemoradiation completed in January 2016. He has been observation since that time. His last CT scan of the chest on 08/30/2016 showed no  concerning findings for disease recurrence. The patient was found on x-ray of the left femur to havequestionable lytic lesion. I ordered a PET scan which was performed recently and it showed hyper metabolic metastatic lesion in the midshaft of the left femur involving the medullary space and cortex and placing the patient at risk for pathologic fracture. He also has left supraclavicular nodal metastasis and questionable right paratracheal nodal metastasis. I personally and independently reviewed the PET scan results and images and showed the images to the patient and his wife. I recommended for the patient to have repeat biopsy of the left supraclavicular lymph node with ultrasound-guided core biopsy by interventional radiology for confirmation of the tissue diagnosis and also to have tissue available for molecular studies and PDL 1 expression. I also referred the patient to radiation oncology for consideration of palliative radiotherapy to the left femur metastatic bone disease. I gave the patient referral of Vicodin for his pain management. I also referred the patient back to his orthopedic surgeon for evaluation and consideration of surgical intervention for the pending pathologic fracture. I will complete the staging workup by ordering a MRI of the brain to rule out brain metastasis. I will see the patient back for follow-up visit in 3 weeks for reevaluation and more detailed discussion of his treatment ns based on thology and molecular studies. He was advised to call immediately if he has any concerning symptoms in the interval. The patient voices understanding of current disease status and treatment options and is in agreement with the current care plan.  All questions were answered. The patient knows to call the clinic with any problems, questions or concerns. We can certainly see the patient much sooner if necessary.  Disclaimer: This note was dictated with voice recognition software. Similar  sounding words can inadvertently be transcribed and may not be corrected upon review.

## 2017-01-17 NOTE — Progress Notes (Addendum)
Histology and Location of Primary Cancer: DIAGNOSIS: stage IIIA (T2a, N2, M0) non-small cell lung cancer, poorly differentiated adenocarcinoma presented with left lower lobe lung mass in addition to mediastinal lymphadenopathy  Sites of Visceral and Bony Metastatic Disease: Left femur  Location(s) of Symptomatic Metastases: Left femur  Past/Anticipated chemotherapy by medical oncology, if any:  Dr. [name] note 01/15/17:  PRIOR THERAPY: 1) status post wedge resection of the left lower lobe lung mass as well as AP window lymph node dissection but there was residual metastatic mediastinal lymphadenopathy that could not be resected. 2) a course of concurrent chemoradiation with weekly carboplatin and paclitaxel in [place] completed 03/01/2015.   Molecular studies and PDL 1 status are unknown.  CURRENT THERAPY: Observation.  I also referred the patient to radiation oncology for consideration of palliative radiotherapy to the left femur metastatic bone disease. I gave the patient referral of Vicodin for his pain management. I also referred the patient back to his orthopedic surgeon for evaluation and consideration of surgical intervention for the pending pathologic fracture. I will complete the staging workup by ordering a MRI of the brain to rule out brain metastasis. I will see the patient back for follow-up visit in 3 weeks for reevaluation and more detailed discussion of his treatment ns based on thology and molecular studies   Pain on a scale of 0-10 is:Left femur and back pain 6/10 scale this am,   If Spine Met(s), symptoms, if any, include:  Bowel/Bladder retention or incontinence   Numbness or weakness in extremities   Current Decadron regimen, if applicable:   Ambulatory status? Walker? Wheelchair?: NO, ambulatory  SAFETY ISSUES: No  Prior radiation? NO  Pacemaker/ICD? NO    Is the patient on methotrexate? NO  Current Complaints / other details:  HX A-Fib, COPD, S/P  TURP, Cardio Conversion x 2(last 2 years ago)  Allergies: Flecainde BP 105/65   Pulse 68   Temp 98.1 F (36.7 C)   Resp 20   Ht 6' 1" (1.854 m)   Wt 266 lb 9.6 oz (120.9 kg)   BMI 35.17 kg/m  

## 2017-01-18 ENCOUNTER — Encounter: Payer: Self-pay | Admitting: Radiation Oncology

## 2017-01-18 ENCOUNTER — Telehealth: Payer: Self-pay | Admitting: Cardiology

## 2017-01-18 ENCOUNTER — Ambulatory Visit
Admission: RE | Admit: 2017-01-18 | Discharge: 2017-01-18 | Disposition: A | Discharge status: Home / Self Care | Payer: Medicare Other | Source: Ambulatory Visit | Attending: Radiation Oncology | Admitting: Radiation Oncology

## 2017-01-18 ENCOUNTER — Telehealth: Payer: Self-pay | Admitting: *Deleted

## 2017-01-18 VITALS — BP 105/65 | HR 68 | Temp 98.1°F | Resp 20 | Ht 73.0 in | Wt 266.6 lb

## 2017-01-18 VITALS — BP 105/65 | HR 78 | Temp 97.7°F | Resp 20 | Ht 73.0 in | Wt 266.6 lb

## 2017-01-18 DIAGNOSIS — Z87891 Personal history of nicotine dependence: Secondary | ICD-10-CM | POA: Diagnosis not present

## 2017-01-18 DIAGNOSIS — Z801 Family history of malignant neoplasm of trachea, bronchus and lung: Secondary | ICD-10-CM | POA: Insufficient documentation

## 2017-01-18 DIAGNOSIS — C7951 Secondary malignant neoplasm of bone: Secondary | ICD-10-CM | POA: Diagnosis not present

## 2017-01-18 DIAGNOSIS — Z803 Family history of malignant neoplasm of breast: Secondary | ICD-10-CM | POA: Diagnosis not present

## 2017-01-18 DIAGNOSIS — J449 Chronic obstructive pulmonary disease, unspecified: Secondary | ICD-10-CM | POA: Diagnosis not present

## 2017-01-18 DIAGNOSIS — C7952 Secondary malignant neoplasm of bone marrow: Secondary | ICD-10-CM

## 2017-01-18 DIAGNOSIS — Z9889 Other specified postprocedural states: Secondary | ICD-10-CM | POA: Diagnosis not present

## 2017-01-18 DIAGNOSIS — Z510 Encounter for antineoplastic radiation therapy: Secondary | ICD-10-CM | POA: Insufficient documentation

## 2017-01-18 DIAGNOSIS — C3492 Malignant neoplasm of unspecified part of left bronchus or lung: Secondary | ICD-10-CM

## 2017-01-18 DIAGNOSIS — Z79899 Other long term (current) drug therapy: Secondary | ICD-10-CM | POA: Insufficient documentation

## 2017-01-18 DIAGNOSIS — C3432 Malignant neoplasm of lower lobe, left bronchus or lung: Secondary | ICD-10-CM | POA: Diagnosis not present

## 2017-01-18 NOTE — Telephone Encounter (Signed)
Surgery clearance faxed via Epic to Dr Mardelle Matte office

## 2017-01-18 NOTE — Telephone Encounter (Signed)
I spoke with the pt. No history of CAD, just AF. No new cardiac issues since he saw Dr Oval Linsey in Sept.  OK to proceed with surgery without further work up. OK to hold Eliquis pre op.  Kerin Ransom PA-C 01/18/2017 2:52 PM

## 2017-01-18 NOTE — Telephone Encounter (Signed)
   [facility] Pre-operative Risk Assessment    Request for surgical clearance:  1. What type of surgery is being performed? Left femoral IM nail   2. When is this surgery scheduled? pending   3. Are there any medications that need to be held prior to surgery and how long?eliquis/they are asking how long to hold   4. Practice name and name of physician performing surgery? Dr Marchia Bond   5. What is your office phone and fax number? Phone=[phone] sherri ext [phone] [phone] attn sherri   6. Anesthesia type (None, local, MAC, general) ? Not listed   Fredia Beets 01/18/2017, 11:32 AM  _________________________________________________________________   (provider comments below)

## 2017-01-18 NOTE — Telephone Encounter (Signed)
   Chart reviewed and I spoke with the patient over the phone as part of pre-operative protocol coverage. Given past medical history and time since last visit, based on ACC/AHA guidelines, Cyler Kappes would be at acceptable risk for the planned procedure without further cardiovascular testing. OK to hold Eliquis 48 hours pre op.   Kerin Ransom, PA-C 01/18/2017, 2:53 PM

## 2017-01-18 NOTE — Progress Notes (Signed)
Radiation Oncology         [phone]) [phone] ________________________________  Name: Victor Huber        MRN: [id number]  Date of Service: 01/18/2017 DOB: Oct 18, 1950  QM:VHQIONG, Jori Moll, MD  Curt Bears, MD     REFERRING PHYSICIAN: Curt Bears, MD   DIAGNOSIS: The primary encounter diagnosis was Secondary malignant neoplasm of bone and bone marrow ([place]). A diagnosis of Bone metastasis ([place]) was also pertinent to this visit.   HISTORY OF PRESENT ILLNESS: Victor Huber is a 66 y.o. male seen at the request of Dr. Julien Nordmann for a history of recurrent metastatic lung cancer. The patient was originally diagnosed with Stage IIIA, T2aN2M0, NSCLC, adenocarcinoma of the left lower lobe which was diagnosed in June of 2016. He was treated surgically to de bulk his disease and followed this with chemoRT which he completed in [place]. He and his wife relocated to [place] this summer and he had been followed in surveillance. He CT on 08/30/16 revealed no change in comparison to his prior films from [place]. He presented with pain in his left femur and was restaged with PET scan on 01/12/17 which revealed a intense area of hypermetabolic activity in the midshaft of the left femur and measures 6 cm in length. In the left anterior abdominal wall there was mild activity without specific lesion, felt to be likely due to trauma or inflammatory process. No additional disease was noted. He comes today to discuss options of radiotherapy to the left femur, and is not planning on any systemic therapy at this point.      PREVIOUS RADIATION THERAPY: Yes   01/11/15-03/01/15: Radiotherapy to the left chest and regional nodes over 7 weeks with concurrent weekly taxol/carboplatin in [place].   PAST MEDICAL HISTORY:  Past Medical History:  Diagnosis Date  . Adenocarcinoma of left lung, stage 3 ([place]) 08/15/2016  . Atrial fibrillation ([place]) 08/15/2016  . Atrial fibrillation ([place])   . COPD (chronic  obstructive pulmonary disease) ([place]) 08/15/2016  . Hyperlipidemia   . Hypothyroid 08/15/2016  . Longstanding persistent atrial fibrillation ([place]) 08/29/2016  . S/P TURP 08/15/2016       PAST SURGICAL HISTORY: Past Surgical History:  Procedure Laterality Date  . BRONCHOSCOPY  10/2014  . CARDIOVERSION     x2  . DG BIOPSY LUNG Left 10/2014   FNA - Adenocarcinoma   . LUNG CANCER SURGERY Left 12/2014   Wedge Resection   . Status post TURP       FAMILY HISTORY:  Family History  Problem Relation Age of Onset  . Breast cancer Mother   . Breast cancer Maternal Aunt   . Breast cancer Maternal Aunt   . Lung disease Neg Hx      SOCIAL HISTORY:  reports that he quit smoking about 4 years ago. His smoking use included Cigarettes. He has a 50.00 pack-year smoking history. He has never used smokeless tobacco. He reports that he does not drink alcohol or use drugs. The patient is married and lives in [place] and relocated from [place].    ALLERGIES: Flecainide   MEDICATIONS:  Current Outpatient Prescriptions  Medication Sig Dispense Refill  . apixaban (ELIQUIS) 5 MG TABS tablet Take 5 mg by mouth 2 (two) times daily.    Marland Kitchen atenolol (TENORMIN) 50 MG tablet Take 50 mg by mouth daily.     Marland Kitchen atorvastatin (LIPITOR) 40 MG tablet Take 40 mg by mouth daily.    . citalopram (CELEXA) 40 MG tablet Take 40 mg  by mouth daily.    Marland Kitchen HYDROcodone-acetaminophen (NORCO) 10-325 MG tablet Take 1 tablet by mouth every 6 (six) hours as needed (chronic pain). 60 tablet 0  . ibuprofen (ADVIL,MOTRIN) 400 MG tablet Take 400 mg by mouth every 4 (four) hours as needed for moderate pain.    Marland Kitchen levothyroxine (SYNTHROID, LEVOTHROID) 200 MCG tablet Take 200 mcg by mouth daily before breakfast.    . levothyroxine (SYNTHROID, LEVOTHROID) 25 MCG tablet Take 25 mcg by mouth daily before breakfast.    . LORazepam (ATIVAN) 0.5 MG tablet     . tamsulosin (FLOMAX) 0.4 MG CAPS capsule Take 0.4 mg by mouth.     No current  facility-administered medications for this encounter.      REVIEW OF SYSTEMS: On review of systems, the patient reports that he continues to have pain in his left femur. He is  able to bear weight when walking. He is taking vicodin as well for this and finds that this adqeuately  relieves his pain. He denies any chest pain, shortness of breath, cough, fevers, chills, night sweats, unintended weight changes. He denies any bowel or bladder disturbances, and denies abdominal pain, nausea or vomiting. He denies any new musculoskeletal or joint aches or pains. A complete review of systems is obtained and is otherwise negative.     PHYSICAL EXAM:  Wt Readings from Last 3 Encounters:  01/18/17 266 lb 9.6 oz (120.9 kg)  01/18/17 266 lb 9.6 oz (120.9 kg)  01/15/17 267 lb 14.4 oz (121.5 kg)   Temp Readings from Last 3 Encounters:  01/18/17 98.1 F (36.7 C)  01/18/17 97.7 F (36.5 C) (Oral)  01/15/17 97.6 F (36.4 C) (Oral)   BP Readings from Last 3 Encounters:  01/18/17 105/65  01/18/17 105/65  01/15/17 123/71   Pulse Readings from Last 3 Encounters:  01/18/17 68  01/18/17 78  01/15/17 89   Pain Assessment Pain Score: 6  Pain Loc: Hip (left femur)/10  In general this is a well appearing white male in no acute distress. He is alert and oriented x4 and appropriate throughout the examination. HEENT reveals that the patient is normocephalic, atraumatic. EOMs are intact. PERRLA. Skin is intact without any evidence of gross lesions. Cardiopulmonary assessment is negative for acute distress and he exhibits normal effort.    ECOG = 1  0 - Asymptomatic (Fully active, able to carry on all predisease activities without restriction)  1 - Symptomatic but completely ambulatory (Restricted in physically strenuous activity but ambulatory and able to carry out work of a light or sedentary nature. For example, light housework, office work)  2 - Symptomatic, <50% in bed during the day (Ambulatory and  capable of all self care but unable to carry out any work activities. Up and about more than 50% of waking hours)  3 - Symptomatic, >50% in bed, but not bedbound (Capable of only limited self-care, confined to bed or chair 50% or more of waking hours)  4 - Bedbound (Completely disabled. Cannot carry on any self-care. Totally confined to bed or chair)  5 - Death   Eustace Pen MM, Creech RH, Tormey DC, et al. [phone]). "Toxicity and response criteria of the [facility] Group". [place] Oncol. 5 (6): 649-55    LABORATORY DATA:  Lab Results  Component Value Date   WBC 5.9 08/15/2016   HGB 15.7 08/15/2016   HCT 46.1 08/15/2016   MCV 96.9 08/15/2016   PLT 186 08/15/2016   Lab Results  Component  Value Date   NA 140 08/15/2016   K 4.2 08/15/2016   CO2 27 08/15/2016   Lab Results  Component Value Date   ALT 21 08/15/2016   AST 19 08/15/2016   ALKPHOS 104 08/15/2016   BILITOT 0.98 08/15/2016      RADIOGRAPHY: Nm Pet Image Restage (ps) Whole Body  Result Date: 01/12/2017 CLINICAL DATA:  Subsequent treatment strategy for lung carcinoma. Non-small cell lung adenocarcinoma. recent LEFT femur pain. Initial diagnosis 2016 with concurrent chemo radiation. EXAM: NUCLEAR MEDICINE PET WHOLE BODY TECHNIQUE: 11.6 mCi F-18 FDG was injected intravenously. Full-ring PET imaging was performed from the vertex to the feet after the radiotracer. CT data was obtained and used for attenuation correction and anatomic localization. The injection to scan time was longer than normal due to power interruption in the PET suite. FASTING BLOOD GLUCOSE:  Value: 138 mg/dl COMPARISON:  PET-CT 2016, CT chest 08/30/2016 FINDINGS: HEAD/NECK Single hypermetabolic LEFT supraclavicular lymph node measures 15 mm short axis (image 80, series 4) with SUV max equal 13.6. CHEST Mild metabolic activity associated with the infrahilar consolidation in the LEFT lower lobe at treatment site is favored post therapy change.  Single mildly hypermetabolic RIGHT paratracheal lymph node measures 9 mm short axis (image 91, series 4) with SUV max equal 4.1. ABDOMEN/PELVIS No abnormal hypermetabolic activity within the liver, pancreas, adrenal glands, or spleen. No hypermetabolic lymph nodes in the abdomen or pelvis. Multiple gallstones noted.  Prostate normal. SKELETON There is intense metabolic activity associated with a midshaft LEFT femur lesion with SUV max equal 24.5. There is periosteal reaction at this level on the CT scan (image 284, series 4). Lesion involves the medullary space and the cortex with potential soft tissue extension laterally. Lesions approximately 6 cm long. There is moderate metabolic activity associated with the LEFT anterior abdominal wall musculature along costosternal junction which is favored benign musculoskeletal trauma or inflammation rather than tumor recurrence. EXTREMITIES hypermetabolic LEFT femur lesion described above. IMPRESSION: 1. Hypermetabolic metastatic lesion to the midshaft LEFT femur involving the medullary space and cortex. Patient at risk for pathologic fracture. 2. LEFT supraclavicular nodal metastasis. Potential RIGHT paratracheal nodal metastasis. 3. No evidence local recurrence in the LEFT lower lobe. 4. Metabolic activity associated with the musculature along the LEFT costovertebral junction is favored behind musculoskeletal activity. Electronically Signed   By: Suzy Bouchard M.D.   On: 01/12/2017 14:40       IMPRESSION/PLAN: 1. Recurrent metastatic Stage IIIA, T2aN2M0, NSCLC, adenocarcinoma of the left lower lobe with probable metastatic disease to the left femur. I discussed the patients history and reviewed his course. we reviewed his PET scan and at this point I believe he would benefit from palliative radiotherapy to this site. We discussed the risks, benefits, short, and long term effects of radiotherapy, and the patient is interested in proceeding. I discussed the delivery  and logistics of radiotherapy and anticipates a course of 8 fractions to a dose of 28 Gy.  This will allow [place] to complete his course of radiation treatment prior to any surgery in this region according to the patient's current schedule. Written consent is obtained and placed in the chart, a copy was provided to the patient. He will simulate in the near future. 2. Pain secondary to #1. The patient continues with vicodin and will keep [place] informed of his course with this.     The patient was seen today for 45 minutes, with the majority of the time spent counseling the patient on  his diagnosis of cancer and coordinating his care.    ________________________________   Jodelle Gross, MD, PhD

## 2017-01-22 ENCOUNTER — Telehealth: Payer: Self-pay | Admitting: Medical Oncology

## 2017-01-22 ENCOUNTER — Ambulatory Visit
Admission: RE | Admit: 2017-01-22 | Discharge: 2017-01-22 | Disposition: A | Discharge status: Home / Self Care | Payer: Medicare Other | Source: Ambulatory Visit | Attending: Radiation Oncology | Admitting: Radiation Oncology

## 2017-01-22 DIAGNOSIS — Z510 Encounter for antineoplastic radiation therapy: Secondary | ICD-10-CM | POA: Diagnosis not present

## 2017-01-22 DIAGNOSIS — C7951 Secondary malignant neoplasm of bone: Secondary | ICD-10-CM

## 2017-01-22 NOTE — Telephone Encounter (Signed)
No the other scan and follow-up visit should be canceled. Keep the one on November 6.

## 2017-01-22 NOTE — Telephone Encounter (Signed)
Returned wife's call. Victor Huber 11/6 for results. Does pt still need scan 11/14 and f/u 11/21( these appts made 6 months ago)

## 2017-01-23 ENCOUNTER — Ambulatory Visit
Admission: RE | Admit: 2017-01-23 | Discharge: 2017-01-23 | Disposition: A | Discharge status: Home / Self Care | Payer: Medicare Other | Source: Ambulatory Visit | Attending: Radiation Oncology | Admitting: Radiation Oncology

## 2017-01-23 DIAGNOSIS — Z510 Encounter for antineoplastic radiation therapy: Secondary | ICD-10-CM | POA: Diagnosis not present

## 2017-01-23 NOTE — Telephone Encounter (Signed)
Notified patient per MD no further scans needed, follow up on 11/6.  Message to scheduling.

## 2017-01-24 ENCOUNTER — Other Ambulatory Visit: Payer: Self-pay | Admitting: Radiation Oncology

## 2017-01-24 ENCOUNTER — Other Ambulatory Visit: Payer: Self-pay | Admitting: General Surgery

## 2017-01-24 ENCOUNTER — Ambulatory Visit
Admission: RE | Admit: 2017-01-24 | Discharge: 2017-01-24 | Disposition: A | Discharge status: Home / Self Care | Payer: Medicare Other | Source: Ambulatory Visit | Attending: Radiation Oncology | Admitting: Radiation Oncology

## 2017-01-24 VITALS — BP 131/92 | HR 97 | Temp 98.1°F | Resp 20

## 2017-01-24 DIAGNOSIS — C7951 Secondary malignant neoplasm of bone: Secondary | ICD-10-CM

## 2017-01-24 DIAGNOSIS — Z510 Encounter for antineoplastic radiation therapy: Secondary | ICD-10-CM | POA: Diagnosis not present

## 2017-01-24 MED ORDER — OXYCODONE-ACETAMINOPHEN 5-325 MG PO TABS
2.0000 | ORAL_TABLET | Freq: Once | ORAL | Status: AC
Start: 2017-01-24 — End: 2017-01-24
  Administered 2017-01-24: 2 via ORAL
  Filled 2017-01-24: qty 2

## 2017-01-24 MED ORDER — OXYCODONE HCL 10 MG PO TABS: 10.0000 mg | ORAL_TABLET | ORAL | 0 refills | Status: DC | PRN

## 2017-01-24 NOTE — Progress Notes (Addendum)
Patient had 3rd rad tx left femur, c/o pain 10/10 scale , didn't sleep well, left hip pain, back, last pain med taken hydrocodone at 1100 with 2 ibuprofen, wants to be seen and rx given for pain 12:52 PM BP (!) 131/92   Pulse 97   Temp 98.1 F (36.7 C) (Oral)   Resp 20   SpO2 100%

## 2017-01-25 ENCOUNTER — Other Ambulatory Visit: Payer: Self-pay | Admitting: Radiology

## 2017-01-25 ENCOUNTER — Ambulatory Visit
Admission: RE | Admit: 2017-01-25 | Discharge: 2017-01-25 | Disposition: A | Discharge status: Home / Self Care | Payer: Medicare Other | Source: Ambulatory Visit | Attending: Radiation Oncology | Admitting: Radiation Oncology

## 2017-01-25 ENCOUNTER — Other Ambulatory Visit: Payer: Self-pay | Admitting: Radiation Oncology

## 2017-01-25 ENCOUNTER — Telehealth: Payer: Self-pay | Admitting: Pulmonary Disease

## 2017-01-25 DIAGNOSIS — Z510 Encounter for antineoplastic radiation therapy: Secondary | ICD-10-CM | POA: Diagnosis not present

## 2017-01-25 NOTE — Telephone Encounter (Signed)
I checked in J[facility]'s cubby and do not see anything Dr Ashok Cordia- have you seen anything on this pt? If not I will call and have them refax, thanks

## 2017-01-25 NOTE — Telephone Encounter (Signed)
Spoke with Victor Huber and notified we never received the form  She will refax it now to the up front fax  Will await

## 2017-01-25 NOTE — Telephone Encounter (Signed)
Not aware of this.

## 2017-01-25 NOTE — Progress Notes (Signed)
The patient was seen for under treat assessment along with Dr. Lisbeth Renshaw. He's not had great improvement in his pain since using norco 10/325. We discussed the use of oxycodone, and he was given a dose of percocet in our office today. He will keep [place] informed on how he responds to oxycodone. While he may benefit from long acting narcotics, my hope is that his relief from radiation will happen rather rapidly in the next week and that there would not be a need for long acting agents. He is in agreement with this plan.

## 2017-01-28 ENCOUNTER — Ambulatory Visit
Admission: RE | Admit: 2017-01-28 | Discharge: 2017-01-28 | Disposition: A | Discharge status: Home / Self Care | Payer: Medicare Other | Source: Ambulatory Visit | Attending: Radiation Oncology | Admitting: Radiation Oncology

## 2017-01-28 ENCOUNTER — Other Ambulatory Visit (HOSPITAL_COMMUNITY)
Admission: RE | Admit: 2017-01-28 | Discharge: 2017-01-28 | Disposition: A | Discharge status: Home / Self Care | Payer: Medicare Other | Source: Ambulatory Visit | Attending: Internal Medicine | Admitting: Internal Medicine

## 2017-01-28 ENCOUNTER — Ambulatory Visit (HOSPITAL_COMMUNITY)
Admission: RE | Admit: 2017-01-28 | Discharge: 2017-01-28 | Disposition: A | Discharge status: Home / Self Care | Payer: Medicare Other | Source: Ambulatory Visit | Attending: Internal Medicine | Admitting: Internal Medicine

## 2017-01-28 ENCOUNTER — Encounter (HOSPITAL_COMMUNITY): Payer: Self-pay

## 2017-01-28 DIAGNOSIS — C770 Secondary and unspecified malignant neoplasm of lymph nodes of head, face and neck: Secondary | ICD-10-CM | POA: Diagnosis not present

## 2017-01-28 DIAGNOSIS — C7951 Secondary malignant neoplasm of bone: Secondary | ICD-10-CM | POA: Diagnosis not present

## 2017-01-28 DIAGNOSIS — E039 Hypothyroidism, unspecified: Secondary | ICD-10-CM | POA: Insufficient documentation

## 2017-01-28 DIAGNOSIS — J449 Chronic obstructive pulmonary disease, unspecified: Secondary | ICD-10-CM | POA: Insufficient documentation

## 2017-01-28 DIAGNOSIS — M898X5 Other specified disorders of bone, thigh: Secondary | ICD-10-CM | POA: Diagnosis not present

## 2017-01-28 DIAGNOSIS — E785 Hyperlipidemia, unspecified: Secondary | ICD-10-CM | POA: Diagnosis not present

## 2017-01-28 DIAGNOSIS — Z87891 Personal history of nicotine dependence: Secondary | ICD-10-CM | POA: Insufficient documentation

## 2017-01-28 DIAGNOSIS — C3492 Malignant neoplasm of unspecified part of left bronchus or lung: Secondary | ICD-10-CM | POA: Diagnosis present

## 2017-01-28 DIAGNOSIS — C3490 Malignant neoplasm of unspecified part of unspecified bronchus or lung: Secondary | ICD-10-CM

## 2017-01-28 DIAGNOSIS — I481 Persistent atrial fibrillation: Secondary | ICD-10-CM | POA: Diagnosis not present

## 2017-01-28 DIAGNOSIS — Z85118 Personal history of other malignant neoplasm of bronchus and lung: Secondary | ICD-10-CM | POA: Diagnosis not present

## 2017-01-28 DIAGNOSIS — R590 Localized enlarged lymph nodes: Secondary | ICD-10-CM | POA: Insufficient documentation

## 2017-01-28 DIAGNOSIS — Z510 Encounter for antineoplastic radiation therapy: Secondary | ICD-10-CM | POA: Diagnosis not present

## 2017-01-28 HISTORY — DX: Personal history of irradiation: Z92.3

## 2017-01-28 HISTORY — DX: Personal history of antineoplastic chemotherapy: Z92.21

## 2017-01-28 LAB — CBC
HCT: 41.8 % (ref 39.0–52.0)
HEMOGLOBIN: 14.8 g/dL (ref 13.0–17.0)
MCH: 33.6 pg (ref 26.0–34.0)
MCHC: 35.4 g/dL (ref 30.0–36.0)
MCV: 94.8 fL (ref 78.0–100.0)
Platelets: 230 10*3/uL (ref 150–400)
RBC: 4.41 MIL/uL (ref 4.22–5.81)
RDW: 13 % (ref 11.5–15.5)
WBC: 7.2 10*3/uL (ref 4.0–10.5)

## 2017-01-28 LAB — PROTIME-INR
INR: 0.99
PROTHROMBIN TIME: 12.9 s (ref 11.4–15.2)

## 2017-01-28 MED ORDER — FENTANYL CITRATE (PF) 100 MCG/2ML IJ SOLN
INTRAMUSCULAR | Status: AC | PRN
  Administered 2017-01-28 (×4): 50 ug via INTRAVENOUS

## 2017-01-28 MED ORDER — FLUMAZENIL 0.5 MG/5ML IV SOLN
INTRAVENOUS | Status: AC
  Filled 2017-01-28: qty 5

## 2017-01-28 MED ORDER — FENTANYL CITRATE (PF) 100 MCG/2ML IJ SOLN
INTRAMUSCULAR | Status: AC
  Filled 2017-01-28: qty 4

## 2017-01-28 MED ORDER — MIDAZOLAM HCL 2 MG/2ML IJ SOLN
INTRAMUSCULAR | Status: AC | PRN
  Administered 2017-01-28 (×4): 1 mg via INTRAVENOUS

## 2017-01-28 MED ORDER — HYDROCODONE-ACETAMINOPHEN 5-325 MG PO TABS: 2.0000 | ORAL_TABLET | Freq: Once | ORAL | Status: DC

## 2017-01-28 MED ORDER — NALOXONE HCL 0.4 MG/ML IJ SOLN
INTRAMUSCULAR | Status: AC
  Filled 2017-01-28: qty 1

## 2017-01-28 MED ORDER — LIDOCAINE HCL 2 % IJ SOLN
INTRAMUSCULAR | Status: AC
  Filled 2017-01-28: qty 10

## 2017-01-28 MED ORDER — ATENOLOL 50 MG PO TABS
50.0000 mg | ORAL_TABLET | Freq: Once | ORAL | Status: AC
  Administered 2017-01-28: 50 mg via ORAL
  Filled 2017-01-28: qty 1

## 2017-01-28 MED ORDER — LIDOCAINE-EPINEPHRINE (PF) 2 %-1:200000 IJ SOLN
INTRAMUSCULAR | Status: AC
  Filled 2017-01-28: qty 20

## 2017-01-28 MED ORDER — SODIUM CHLORIDE 0.9 % IV SOLN
INTRAVENOUS | Status: DC
  Administered 2017-01-28: 11:00:00 via INTRAVENOUS

## 2017-01-28 MED ORDER — MIDAZOLAM HCL 2 MG/2ML IJ SOLN
INTRAMUSCULAR | Status: AC
  Filled 2017-01-28: qty 4

## 2017-01-28 NOTE — H&P (Signed)
Chief Complaint: Patient was seen in consultation today for LN biopsy at the request of [facility]  Referring Physician(s): [name],[name]  Supervising Physician: Sandi Mariscal  Patient Status: [place]  History of Present Illness: Victor Huber is a 66 y.o. male with hx of left lung cancer. He is now found to have hypermetabolic lesions on recent PET concerning for metastatic disease. He is referred for bx of (L)[place] LN. PMHx, meds, labs, imaging reviewed. Has been NPO this am. Wife at bedside  Past Medical History:  Diagnosis Date  . Adenocarcinoma of left lung, stage 3 ([place]) 08/15/2016  . Atrial fibrillation ([place]) 08/15/2016  . Atrial fibrillation ([place])   . COPD (chronic obstructive pulmonary disease) ([place]) 08/15/2016  . History of chemotherapy   . History of radiation therapy   . Hyperlipidemia   . Hypothyroid 08/15/2016  . Longstanding persistent atrial fibrillation ([place]) 08/29/2016  . S/P TURP 08/15/2016    Past Surgical History:  Procedure Laterality Date  . BRONCHOSCOPY  10/2014  . CARDIOVERSION     x2  . DG BIOPSY LUNG Left 10/2014   FNA - Adenocarcinoma   . LUNG CANCER SURGERY Left 12/2014   Wedge Resection   . Status post TURP      Allergies: Flecainide  Medications: Prior to Admission medications   Medication Sig Start Date End Date Taking? Authorizing Provider  atenolol (TENORMIN) 50 MG tablet Take 50 mg by mouth daily.  11/28/16  Yes [provider]  atorvastatin (LIPITOR) 40 MG tablet Take 40 mg by mouth daily.   Yes [provider]  citalopram (CELEXA) 40 MG tablet Take 40 mg by mouth daily.   Yes [provider]  HYDROcodone-acetaminophen (NORCO) 10-325 MG tablet Take 1 tablet by mouth every 6 (six) hours as needed (chronic pain). 01/15/17  Yes Curt Bears, MD  ibuprofen (ADVIL,MOTRIN) 400 MG tablet Take 400 mg by mouth every 4 (four) hours as needed for moderate pain.   Yes [provider]    levothyroxine (SYNTHROID, LEVOTHROID) 200 MCG tablet Take 200 mcg by mouth daily before breakfast.   Yes [provider]  levothyroxine (SYNTHROID, LEVOTHROID) 25 MCG tablet Take 25 mcg by mouth daily before breakfast.   Yes [provider]  oxyCODONE 10 MG TABS Take 1-2 tablets (10-20 mg total) by mouth every 4 (four) hours as needed for severe pain. 01/24/17  Yes Hayden Pedro, PA-C  tamsulosin (FLOMAX) 0.4 MG CAPS capsule Take 0.4 mg by mouth.   Yes [provider]  apixaban (ELIQUIS) 5 MG TABS tablet Take 5 mg by mouth 2 (two) times daily.    [provider]  LORazepam (ATIVAN) 0.5 MG tablet  05/15/16   [provider]     Family History  Problem Relation Age of Onset  . Breast cancer Mother   . Breast cancer Maternal Aunt   . Breast cancer Maternal Aunt   . Lung disease Neg Hx     Social History   Social History  . Marital status: Married    Spouse name: N/A  . Number of children: N/A  . Years of education: N/A   Social History Main Topics  . Smoking status: Former Smoker    Packs/day: 1.00    Years: 50.00    Types: Cigarettes    Quit date: 08/15/2012  . Smokeless tobacco: Never Used  . Alcohol use No  . Drug use: No  . Sexual activity: Not Asked   Other Topics Concern  .  None   Social History Narrative   [facility] (09/26/16):   Originally from [place]. Moved to [facility] February 2018. Always lived in [place]. Moved to be closer to children & grandchildren. No international travel. 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 No mold exposure. No bird exposure or hot tub exposure. Enjoys reading. Previously enjoyed wood working with domestic woods.      Review of Systems: A 12 point ROS discussed and pertinent positives are indicated in the HPI above.  All other systems are negative.  Review of Systems  Vital Signs: BP 137/84 (BP Location: Right  Arm)   Pulse 87   Temp (!) 97.5 F (36.4 C) (Oral)   Resp 18   SpO2 99%   Physical Exam  Constitutional: He is oriented to person, place, and time. He appears well-developed. No distress.  HENT:  Head: Normocephalic.  Mouth/Throat: Oropharynx is clear and moist.  Neck: Normal range of motion. No JVD present. No tracheal deviation present.  Cardiovascular: Normal rate, regular rhythm and normal heart sounds.   Pulmonary/Chest: Effort normal and breath sounds normal. No respiratory distress.  Neurological: He is alert and oriented to person, place, and time.  Skin: Skin is warm and dry.  Psychiatric: He has a normal mood and affect.    Imaging: Nm Pet Image Restage (ps) Whole Body  Result Date: 01/12/2017 CLINICAL DATA:  Subsequent treatment strategy for lung carcinoma. Non-small cell lung adenocarcinoma. recent LEFT femur pain. Initial diagnosis 2016 with concurrent chemo radiation. EXAM: NUCLEAR MEDICINE PET WHOLE BODY TECHNIQUE: 11.6 mCi F-18 FDG was injected intravenously. Full-ring PET imaging was performed from the vertex to the feet after the radiotracer. CT data was obtained and used for attenuation correction and anatomic localization. The injection to scan time was longer than normal due to power interruption in the PET suite. FASTING BLOOD GLUCOSE:  Value: 138 mg/dl COMPARISON:  PET-CT 2016, CT chest 08/30/2016 FINDINGS: HEAD/NECK Single hypermetabolic LEFT supraclavicular lymph node measures 15 mm short axis (image 80, series 4) with SUV max equal 13.6. CHEST Mild metabolic activity associated with the infrahilar consolidation in the LEFT lower lobe at treatment site is favored post therapy change. Single mildly hypermetabolic RIGHT paratracheal lymph node measures 9 mm short axis (image 91, series 4) with SUV max equal 4.1. ABDOMEN/PELVIS No abnormal hypermetabolic activity within the liver, pancreas, adrenal glands, or spleen. No hypermetabolic lymph nodes in the abdomen or  pelvis. Multiple gallstones noted.  Prostate normal. SKELETON There is intense metabolic activity associated with a midshaft LEFT femur lesion with SUV max equal 24.5. There is periosteal reaction at this level on the CT scan (image 284, series 4). Lesion involves the medullary space and the cortex with potential soft tissue extension laterally. Lesions approximately 6 cm long. There is moderate metabolic activity associated with the LEFT anterior abdominal wall musculature along costosternal junction which is favored benign musculoskeletal trauma or inflammation rather than tumor recurrence. EXTREMITIES hypermetabolic LEFT femur lesion described above. IMPRESSION: 1. Hypermetabolic metastatic lesion to the midshaft LEFT femur involving the medullary space and cortex. Patient at risk for pathologic fracture. 2. LEFT supraclavicular nodal metastasis. Potential RIGHT paratracheal nodal metastasis. 3. No evidence local recurrence in the LEFT lower lobe. 4. Metabolic activity associated with the musculature along the LEFT costovertebral junction is favored behind musculoskeletal activity. Electronically Signed   By: Suzy Bouchard M.D.   On: 01/12/2017 14:40    Labs:  CBC:  Recent Labs  08/15/16 1323 01/28/17 1113  WBC 5.9 7.2  HGB 15.7 14.8  HCT 46.1 41.8  PLT 186 230    COAGS:  Recent Labs  01/28/17 1113  INR 0.99    BMP:  Recent Labs  08/15/16 1323  NA 140  K 4.2  CO2 27  GLUCOSE 141*  BUN 21.6  CALCIUM 9.3  CREATININE 0.9    LIVER FUNCTION TESTS:  Recent Labs  08/15/16 1323  BILITOT 0.98  AST 19  ALT 21  ALKPHOS 104  PROT 6.7  ALBUMIN 4.0    TUMOR MARKERS: No results for input(s): AFPTM, CEA, CA199, CHROMGRNA in the last 8760 hours.  Assessment and Plan: Lung cancer Hypermetabolic (L)supraclaviular LN For US guided biopsy Labs ok Risks and benefits discussed with the patient including, but not limited to bleeding, infection, damage to adjacent structures  or low yield requiring additional tests. All of the patient's questions were answered, patient is agreeable to proceed. Consent signed and in chart.    Thank you for this interesting consult.  I greatly enjoyed meeting Margie Brink and look forward to participating in their care.  A copy of this report was sent to the requesting provider on this date.  Electronically Signed: Ascencion Dike, PA-C 01/28/2017, 12:32 PM   I spent a total of 20 minutes in face to face in clinical consultation, greater than 50% of which was counseling/coordinating care for LN biopsy

## 2017-01-28 NOTE — Discharge Instructions (Signed)
Moderate Conscious Sedation, Adult, Care After These instructions provide you with information about caring for yourself after your procedure. Your health care provider may also give you more specific instructions. Your treatment has been planned according to current medical practices, but problems sometimes occur. Call your health care provider if you have any problems or questions after your procedure. What can I expect after the procedure? After your procedure, it is common:  To feel sleepy for several hours.  To feel clumsy and have poor balance for several hours.  To have poor judgment for several hours.  To vomit if you eat too soon.  Follow these instructions at home: For at least 24 hours after the procedure:   Do not: ? Participate in activities where you could fall or become injured. ? Drive. ? Use heavy machinery. ? Drink alcohol. ? Take sleeping pills or medicines that cause drowsiness. ? Make important decisions or sign legal documents. ? Take care of children on your own.  Rest. Eating and drinking  Follow the diet recommended by your health care provider.  If you vomit: ? Drink water, juice, or soup when you can drink without vomiting. ? Make sure you have little or no nausea before eating solid foods. General instructions  Have a responsible adult stay with you until you are awake and alert.  Take over-the-counter and prescription medicines only as told by your health care provider.  If you smoke, do not smoke without supervision.  Keep all follow-up visits as told by your health care provider. This is important. Contact a health care provider if:  You keep feeling nauseous or you keep vomiting.  You feel light-headed.  You develop a rash.  You have a fever. Get help right away if:  You have trouble breathing. This information is not intended to replace advice given to you by your health care provider. Make sure you discuss any questions you have  with your health care provider. Document Released: 01/07/2013 Document Revised: 08/22/2015 Document Reviewed: 07/09/2015 Elsevier Interactive Patient Education  2018 [employer].   Needle Biopsy, Care After These instructions give you information about caring for yourself after your procedure. Your doctor may also give you more specific instructions. Call your doctor if you have any problems or questions after your procedure. Follow these instructions at home:  Rest as told by your doctor.  Take medicines only as told by your doctor.  There are many different ways to close and cover the biopsy site, including stitches (sutures), skin glue, and adhesive strips. Follow instructions from your doctor about: ? How to take care of your biopsy site. ? When and how you should change your bandage (dressing).  You may remove your dressing tomorrow 01/29/17. ? When you should remove your dressing. ? Removing whatever was used to close your biopsy site.  Check your biopsy site every day for signs of infection. Watch for: ? Redness, swelling, or pain. ? Fluid, blood, or pus. Contact a doctor if:  You have a fever.  You have redness, swelling, or pain at the biopsy site, and it lasts longer than a few days.  You have fluid, blood, or pus coming from the biopsy site.  You feel sick to your stomach (nauseous).  You throw up (vomit). Get help right away if:  You are short of breath.  You have trouble breathing.  Your chest hurts.  You feel dizzy or you pass out (faint).  You have bleeding that does not stop with pressure  or a bandage.  You cough up blood.  Your belly (abdomen) hurts. This information is not intended to replace advice given to you by your health care provider. Make sure you discuss any questions you have with your health care provider. Document Released: 03/01/2008 Document Revised: 08/25/2015 Document Reviewed: 03/15/2014 Elsevier Interactive Patient Education   2018 [employer].   Atrial Fibrillation Atrial fibrillation is a type of heartbeat that is irregular or fast (rapid). If you have this condition, your heart keeps quivering in a weird (chaotic) way. This condition can make it so your heart cannot pump blood normally. Having this condition gives a person more risk for stroke, heart failure, and other heart problems. There are different types of atrial fibrillation. Talk with your doctor to learn about the type that you have. Follow these instructions at home:  Take over-the-counter and prescription medicines only as told by your doctor.  If your doctor prescribed a blood-thinning medicine, take it exactly as told. Taking too much of it can cause bleeding. If you do not take enough of it, you will not have the protection that you need against stroke and other problems.  Do not use any tobacco products. These include cigarettes, chewing tobacco, and e-cigarettes. If you need help quitting, ask your doctor.  If you have apnea (obstructive sleep apnea), manage it as told by your doctor.  Do not drink alcohol.  Do not drink beverages that have caffeine. These include coffee, soda, and tea.  Maintain a healthy weight. Do not use diet pills unless your doctor says they are safe for you. Diet pills may make heart problems worse.  Follow diet instructions as told by your doctor.  Exercise regularly as told by your doctor.  Keep all follow-up visits as told by your doctor. This is important. Contact a doctor if:  You notice a change in the speed, rhythm, or strength of your heartbeat.  You are taking a blood-thinning medicine and you notice more bruising.  You get tired more easily when you move or exercise. Get help right away if:  You have pain in your chest or your belly (abdomen).  You have sweating or weakness.  You feel sick to your stomach (nauseous).  You notice blood in your throw up (vomit), poop (stool), or pee  (urine).  You are short of breath.  You suddenly have swollen feet and ankles.  You feel dizzy.  Your suddenly get weak or numb in your face, arms, or legs, especially if it happens on one side of your body.  You have trouble talking, trouble understanding, or both.  Your face or your eyelid droops on one side. These symptoms may be an emergency. Do not wait to see if the symptoms will go away. Get medical help right away. Call your local emergency services (911 in the [place] Do not drive yourself to the hospital. This information is not intended to replace advice given to you by your health care provider. Make sure you discuss any questions you have with your health care provider. Document Released: 12/27/2007 Document Revised: 08/25/2015 Document Reviewed: 07/14/2014 Elsevier Interactive Patient Education  [employer].

## 2017-01-28 NOTE — Procedures (Signed)
Pre Procedure Dx: History of Lung cancer, now with hypermetabolic left Cx LN Post Procedural Dx: Same  Technically successful US guided biopsy of hypermetabolic left cervical LN.   EBL: None  No immediate complications.   Ronny Bacon, MD Pager #: [phone]

## 2017-01-28 NOTE — Sedation Documentation (Addendum)
Pt with HR 140-150. Atenolol 50 mg ordered, pain medicine ordered. If HR remains elevated after atenolol dosing will order 12-lead EKG and notify Ascencion Dike, [place]

## 2017-01-28 NOTE — Telephone Encounter (Signed)
Checked JN's folder, front fax, and back fax and no sx clearance form has been received.  lmtcb X1 for sherri at Dr. Luanna Cole office to have sx form refaxed.  Will await fax.

## 2017-01-28 NOTE — Progress Notes (Signed)
RN notified Ascencion Dike, PA patients current vital signs post procedure and po atenolol.  Lennette Bihari states as long as patient is asymptomatic and vital sign remain stable no need for EKG and discharge patient home as ordered by Dr. Pascal Lux.

## 2017-01-28 NOTE — Sedation Documentation (Signed)
Patient denies pain and is resting comfortably.  

## 2017-01-29 ENCOUNTER — Ambulatory Visit
Admission: RE | Admit: 2017-01-29 | Discharge: 2017-01-29 | Disposition: A | Discharge status: Home / Self Care | Payer: Medicare Other | Source: Ambulatory Visit | Attending: Radiation Oncology | Admitting: Radiation Oncology

## 2017-01-29 ENCOUNTER — Ambulatory Visit
Admission: RE | Admit: 2017-01-29 | Discharge: 2017-01-29 | Disposition: A | Discharge status: Home / Self Care | Payer: Medicare Other | Source: Ambulatory Visit | Attending: Internal Medicine | Admitting: Internal Medicine

## 2017-01-29 DIAGNOSIS — C3490 Malignant neoplasm of unspecified part of unspecified bronchus or lung: Secondary | ICD-10-CM

## 2017-01-29 DIAGNOSIS — Z510 Encounter for antineoplastic radiation therapy: Secondary | ICD-10-CM | POA: Diagnosis not present

## 2017-01-29 DIAGNOSIS — C3492 Malignant neoplasm of unspecified part of left bronchus or lung: Secondary | ICD-10-CM

## 2017-01-29 MED ORDER — GADOBENATE DIMEGLUMINE 529 MG/ML IV SOLN
20.0000 mL | Freq: Once | INTRAVENOUS | Status: AC | PRN
  Administered 2017-01-29: 20 mL via INTRAVENOUS

## 2017-01-29 NOTE — Telephone Encounter (Signed)
Checked all of the places I know to find the form and still do not see anything on this pt  Called Victor Huber again and left another msg

## 2017-01-30 ENCOUNTER — Ambulatory Visit
Admission: RE | Admit: 2017-01-30 | Discharge: 2017-01-30 | Disposition: A | Discharge status: Home / Self Care | Payer: Medicare Other | Source: Ambulatory Visit | Attending: Radiation Oncology | Admitting: Radiation Oncology

## 2017-01-30 DIAGNOSIS — Z510 Encounter for antineoplastic radiation therapy: Secondary | ICD-10-CM | POA: Diagnosis not present

## 2017-01-31 ENCOUNTER — Ambulatory Visit
Admission: RE | Admit: 2017-01-31 | Discharge: 2017-01-31 | Disposition: A | Discharge status: Home / Self Care | Payer: Medicare Other | Source: Ambulatory Visit | Attending: Radiation Oncology | Admitting: Radiation Oncology

## 2017-01-31 DIAGNOSIS — Z510 Encounter for antineoplastic radiation therapy: Secondary | ICD-10-CM | POA: Diagnosis not present

## 2017-01-31 NOTE — Telephone Encounter (Signed)
Victor Huber returned phone call, will fax over another clearance form;

## 2017-01-31 NOTE — Telephone Encounter (Signed)
JN could you do a surgical clearance note for the pt so that we may fax this over to Dr. Luanna Cole office?  Thanks

## 2017-02-01 ENCOUNTER — Encounter: Payer: Self-pay | Admitting: Radiation Oncology

## 2017-02-01 ENCOUNTER — Other Ambulatory Visit: Payer: Self-pay | Admitting: Radiation Oncology

## 2017-02-01 ENCOUNTER — Ambulatory Visit
Admission: RE | Admit: 2017-02-01 | Discharge: 2017-02-01 | Disposition: A | Discharge status: Home / Self Care | Payer: Medicare Other | Source: Ambulatory Visit | Attending: Radiation Oncology | Admitting: Radiation Oncology

## 2017-02-01 DIAGNOSIS — Z510 Encounter for antineoplastic radiation therapy: Secondary | ICD-10-CM | POA: Diagnosis not present

## 2017-02-01 DIAGNOSIS — C3490 Malignant neoplasm of unspecified part of unspecified bronchus or lung: Secondary | ICD-10-CM

## 2017-02-01 DIAGNOSIS — C3492 Malignant neoplasm of unspecified part of left bronchus or lung: Secondary | ICD-10-CM

## 2017-02-01 MED ORDER — HYDROCODONE-ACETAMINOPHEN 10-325 MG PO TABS: 1.0000 | ORAL_TABLET | Freq: Four times a day (QID) | ORAL | 0 refills | Status: DC | PRN

## 2017-02-01 NOTE — Telephone Encounter (Signed)
That's fine. Just let me know when we have the form and I'll fill it out.

## 2017-02-01 NOTE — Telephone Encounter (Signed)
Check both fax machines, JN's cubby....did not see form.

## 2017-02-04 NOTE — Telephone Encounter (Signed)
Form is in [place] folder up front and will be placed in [place] cubby to address.  Will forward to [place] and CC to follow up on .

## 2017-02-04 NOTE — Telephone Encounter (Signed)
I have placed the form in Dr. Riki Sheer sign folder and placed it on his desk for it to be done. Once completed I will fax back to the correct place.

## 2017-02-05 ENCOUNTER — Ambulatory Visit (HOSPITAL_BASED_OUTPATIENT_CLINIC_OR_DEPARTMENT_OTHER): Payer: Medicare Other | Admitting: Internal Medicine

## 2017-02-05 ENCOUNTER — Other Ambulatory Visit (HOSPITAL_BASED_OUTPATIENT_CLINIC_OR_DEPARTMENT_OTHER): Payer: Medicare Other

## 2017-02-05 ENCOUNTER — Encounter: Payer: Self-pay | Admitting: *Deleted

## 2017-02-05 ENCOUNTER — Encounter: Payer: Self-pay | Admitting: Internal Medicine

## 2017-02-05 ENCOUNTER — Telehealth: Payer: Self-pay

## 2017-02-05 VITALS — BP 117/78 | HR 100 | Temp 97.8°F | Resp 18 | Ht 73.0 in | Wt 258.5 lb

## 2017-02-05 DIAGNOSIS — C770 Secondary and unspecified malignant neoplasm of lymph nodes of head, face and neck: Secondary | ICD-10-CM | POA: Diagnosis not present

## 2017-02-05 DIAGNOSIS — C3432 Malignant neoplasm of lower lobe, left bronchus or lung: Secondary | ICD-10-CM

## 2017-02-05 DIAGNOSIS — C3490 Malignant neoplasm of unspecified part of unspecified bronchus or lung: Secondary | ICD-10-CM

## 2017-02-05 DIAGNOSIS — J449 Chronic obstructive pulmonary disease, unspecified: Secondary | ICD-10-CM

## 2017-02-05 DIAGNOSIS — C3492 Malignant neoplasm of unspecified part of left bronchus or lung: Secondary | ICD-10-CM

## 2017-02-05 LAB — COMPREHENSIVE METABOLIC PANEL
ALBUMIN: 3.7 g/dL (ref 3.5–5.0)
ALK PHOS: 102 U/L (ref 40–150)
ALT: 24 U/L (ref 0–55)
AST: 19 U/L (ref 5–34)
Anion Gap: 8 mEq/L (ref 3–11)
BUN: 20.8 mg/dL (ref 7.0–26.0)
CO2: 27 mEq/L (ref 22–29)
Calcium: 9.7 mg/dL (ref 8.4–10.4)
Chloride: 106 mEq/L (ref 98–109)
Creatinine: 0.9 mg/dL (ref 0.7–1.3)
EGFR: >60 mL/min/{1.73_m2} (ref >60)
GLUCOSE: 123 mg/dL (ref 70–140)
POTASSIUM: 5.4 meq/L — AB (ref 3.5–5.1)
SODIUM: 141 meq/L (ref 136–145)
TOTAL PROTEIN: 6.9 g/dL (ref 6.4–8.3)
Total Bilirubin: 0.8 mg/dL (ref 0.20–1.20)

## 2017-02-05 LAB — CBC WITH DIFFERENTIAL/PLATELET
BASO%: 0.2 % (ref 0.0–2.0)
Basophils Absolute: 0 10*3/uL (ref 0.0–0.1)
EOS ABS: 0 10*3/uL (ref 0.0–0.5)
EOS%: 0.7 % (ref 0.0–7.0)
HEMATOCRIT: 45.7 % (ref 38.4–49.9)
HGB: 15.5 g/dL (ref 13.0–17.1)
LYMPH%: 19.5 % (ref 14.0–49.0)
MCH: 32.8 pg (ref 27.2–33.4)
MCHC: 34 g/dL (ref 32.0–36.0)
MCV: 96.5 fL (ref 79.3–98.0)
MONO#: 0.5 10*3/uL (ref 0.1–0.9)
MONO%: 7.7 % (ref 0.0–14.0)
NEUT#: 4.6 10*3/uL (ref 1.5–6.5)
NEUT%: 71.9 % (ref 39.0–75.0)
PLATELETS: 216 10*3/uL (ref 140–400)
RBC: 4.73 10*6/uL (ref 4.20–5.82)
RDW: 13.1 % (ref 11.0–14.6)
WBC: 6.4 10*3/uL (ref 4.0–10.3)
lymph#: 1.3 10*3/uL (ref 0.9–3.3)

## 2017-02-05 NOTE — Telephone Encounter (Signed)
Printed avs and calender for upcoming appointment. Per 11/6 los

## 2017-02-05 NOTE — Progress Notes (Signed)
Oncology Nurse Navigator Documentation  Oncology Nurse Navigator Flowsheets 02/05/2017  Navigator Location [facility]  Navigator Encounter Type Other/per Dr. Julien Nordmann, I requested foundation one and PDL 1 testing on recent biopsy.   Patient Visit Type MedOnc  Treatment Phase Pre-Tx/Tx Discussion  Barriers/Navigation Needs Coordination of Care  Interventions Coordination of Care  Acuity Level 2  Time Spent with Patient 30

## 2017-02-05 NOTE — Progress Notes (Signed)
[facility] [phone]   [phone]  OFFICE PROGRESS NOTE  [name], MD [address], [place] [address]  DIAGNOSIS: Metastatic non-small cell lung cancer initially diagnosed as stage IIIA (T2a, N2, M0) non-small cell lung cancer, poorly differentiated adenocarcinoma presented with left lower lobe lung mass in addition to mediastinal lymphadenopathy.  The patient was diagnosed with metastatic disease involving the left femur as well as left supraclavicular nodal metastases and right paratracheal lymphadenopathy in October 2018.  PRIOR THERAPY: 1) status post wedge resection of the left lower lobe lung mass as well as AP window lymph node dissection but there was residual metastatic mediastinal lymphadenopathy that could not be resected. 2) a course of concurrent chemoradiation with weekly carboplatin and paclitaxel in [place] completed 03/01/2015.  3) status post palliative radiotherapy to the left femur metastatic bone disease.  Molecular studies and PDL 1 status are unknown.  CURRENT THERAPY: Observation.  INTERVAL HISTORY: Victor Huber 66 y.o. male returns to the clinic today for follow-up visit accompanied by his wife and son.  The patient is feeling fine today with no specific complaints except for the pain and the left femur.  He denied having any chest pain, shortness of breath, cough or hemoptysis.  He denied having any weight loss or night sweats.  He has no nausea, vomiting, diarrhea or constipation.  He is currently on oxycodone for pain management and he received 120 tablets from radiation oncology last week.  The patient had ultrasound-guided core biopsy of the left supraclavicular lymph nodes and he is here today for evaluation and discussion of his biopsy results and treatment options.  He also had MRI of the brain that was unremarkable for metastatic disease to the brain.  MEDICAL HISTORY: Past Medical History:    Diagnosis Date  . Adenocarcinoma of left lung, stage 3 ([place]) 08/15/2016  . Atrial fibrillation ([place]) 08/15/2016  . Atrial fibrillation ([place])   . COPD (chronic obstructive pulmonary disease) ([place]) 08/15/2016  . History of chemotherapy   . History of radiation therapy   . Hyperlipidemia   . Hypothyroid 08/15/2016  . Longstanding persistent atrial fibrillation ([place]) 08/29/2016  . S/P TURP 08/15/2016    ALLERGIES:  is allergic to flecainide.  MEDICATIONS:  Current Outpatient Medications  Medication Sig Dispense Refill  . apixaban (ELIQUIS) 5 MG TABS tablet Take 5 mg by mouth 2 (two) times daily.    Marland Kitchen atenolol (TENORMIN) 50 MG tablet Take 50 mg by mouth daily.     Marland Kitchen atorvastatin (LIPITOR) 40 MG tablet Take 40 mg by mouth daily.    . citalopram (CELEXA) 40 MG tablet Take 40 mg by mouth daily.    Marland Kitchen ibuprofen (ADVIL,MOTRIN) 400 MG tablet Take 400 mg by mouth every 4 (four) hours as needed for moderate pain.    Marland Kitchen levothyroxine (SYNTHROID, LEVOTHROID) 200 MCG tablet Take 200 mcg by mouth daily before breakfast.    . levothyroxine (SYNTHROID, LEVOTHROID) 25 MCG tablet Take 25 mcg by mouth daily before breakfast.    . LORazepam (ATIVAN) 0.5 MG tablet     . oxyCODONE 10 MG TABS Take 1-2 tablets (10-20 mg total) by mouth every 4 (four) hours as needed for severe pain. 120 tablet 0  . tamsulosin (FLOMAX) 0.4 MG CAPS capsule Take 0.4 mg by mouth.     No current facility-administered medications for this visit.     SURGICAL HISTORY:  Past Surgical History:  Procedure Laterality Date  . BRONCHOSCOPY  10/2014  . CARDIOVERSION     x2  . DG BIOPSY LUNG Left 10/2014   FNA - Adenocarcinoma   . LUNG CANCER SURGERY Left 12/2014   Wedge Resection   . Status post TURP      REVIEW OF SYSTEMS:  Constitutional: positive for fatigue Eyes: negative Ears, nose, mouth, throat, and face: negative Respiratory: negative Cardiovascular: negative Gastrointestinal:  negative Genitourinary:negative Integument/breast: negative Hematologic/lymphatic: negative Musculoskeletal:positive for back pain and bone pain Neurological: negative Behavioral/Psych: negative Endocrine: negative Allergic/Immunologic: negative   PHYSICAL EXAMINATION: General appearance: alert, cooperative, fatigued and no distress Head: Normocephalic, without obvious abnormality, atraumatic Neck: no adenopathy, no JVD, supple, symmetrical, trachea midline and thyroid not enlarged, symmetric, no tenderness/mass/nodules Lymph nodes: Cervical, supraclavicular, and axillary nodes normal. Resp: clear to auscultation bilaterally Back: symmetric, no curvature. ROM normal. No CVA tenderness. Cardio: regular rate and rhythm, S1, S2 normal, no murmur, click, rub or gallop GI: soft, non-tender; bowel sounds normal; no masses,  no organomegaly Extremities: extremities normal, atraumatic, no cyanosis or edema Neurologic: Alert and oriented X 3, normal strength and tone. Normal symmetric reflexes. Normal coordination and gait  ECOG PERFORMANCE STATUS: 1 - Symptomatic but completely ambulatory  Blood pressure 117/78, pulse 100, temperature 97.8 F (36.6 C), temperature source Oral, resp. rate 18, height 6\' 1"  (1.854 m), weight 258 lb 8 oz (117.3 kg), SpO2 98 %.  LABORATORY DATA: Lab Results  Component Value Date   WBC 6.4 02/05/2017   HGB 15.5 02/05/2017   HCT 45.7 02/05/2017   MCV 96.5 02/05/2017   PLT 216 02/05/2017      Chemistry      Component Value Date/Time   NA 140 08/15/2016 1323   K 4.2 08/15/2016 1323   CO2 27 08/15/2016 1323   BUN 21.6 08/15/2016 1323   CREATININE 0.9 08/15/2016 1323      Component Value Date/Time   CALCIUM 9.3 08/15/2016 1323   ALKPHOS 104 08/15/2016 1323   AST 19 08/15/2016 1323   ALT 21 08/15/2016 1323   BILITOT 0.98 08/15/2016 1323       RADIOGRAPHIC STUDIES: Mr Jeri Cos FV Contrast  Result Date: 01/29/2017 CLINICAL DATA:  Followup lung  cancer staging. Creatinine was obtained on site at [facility] at [address] Results: Creatinine 0.9 mg/dL. EXAM: MRI HEAD WITHOUT AND WITH CONTRAST TECHNIQUE: Multiplanar, multiecho pulse sequences of the brain and surrounding structures were obtained without and with intravenous contrast. CONTRAST:  69mL MULTIHANCE GADOBENATE DIMEGLUMINE 529 MG/ML IV SOLN COMPARISON:  11/18/2014 FINDINGS: Brain: Mild age related volume loss. No evidence of old or acute small or large vessel infarction. No primary or metastatic mass lesion, hemorrhage, hydrocephalus or extra-axial collection. Vascular: Major vessels at the base of the brain show flow. Skull and upper cervical spine: Negative Sinuses/Orbits: Clear except for minimal mucosal thickening along the maxillary sinus floors. Orbits negative. Other: None IMPRESSION: Normal examination for age.  No evidence of metastatic disease. Electronically Signed   By: Nelson Chimes M.D.   On: 01/29/2017 11:02   Nm Pet Image Restage (ps) Whole Body  Result Date: 01/12/2017 CLINICAL DATA:  Subsequent treatment strategy for lung carcinoma. Non-small cell lung adenocarcinoma. recent LEFT femur pain. Initial diagnosis 2016 with concurrent chemo radiation. EXAM: NUCLEAR MEDICINE PET WHOLE BODY TECHNIQUE: 11.6 mCi F-18 FDG was injected intravenously. Full-ring PET imaging was performed from the vertex to the feet after the radiotracer. CT data was obtained and used for attenuation correction and anatomic localization. The injection to scan  time was longer than normal due to power interruption in the PET suite. FASTING BLOOD GLUCOSE:  Value: 138 mg/dl COMPARISON:  PET-CT 2016, CT chest 08/30/2016 FINDINGS: HEAD/NECK Single hypermetabolic LEFT supraclavicular lymph node measures 15 mm short axis (image 80, series 4) with SUV max equal 13.6. CHEST Mild metabolic activity associated with the infrahilar consolidation in the LEFT lower lobe at treatment site is favored post  therapy change. Single mildly hypermetabolic RIGHT paratracheal lymph node measures 9 mm short axis (image 91, series 4) with SUV max equal 4.1. ABDOMEN/PELVIS No abnormal hypermetabolic activity within the liver, pancreas, adrenal glands, or spleen. No hypermetabolic lymph nodes in the abdomen or pelvis. Multiple gallstones noted.  Prostate normal. SKELETON There is intense metabolic activity associated with a midshaft LEFT femur lesion with SUV max equal 24.5. There is periosteal reaction at this level on the CT scan (image 284, series 4). Lesion involves the medullary space and the cortex with potential soft tissue extension laterally. Lesions approximately 6 cm long. There is moderate metabolic activity associated with the LEFT anterior abdominal wall musculature along costosternal junction which is favored benign musculoskeletal trauma or inflammation rather than tumor recurrence. EXTREMITIES hypermetabolic LEFT femur lesion described above. IMPRESSION: 1. Hypermetabolic metastatic lesion to the midshaft LEFT femur involving the medullary space and cortex. Patient at risk for pathologic fracture. 2. LEFT supraclavicular nodal metastasis. Potential RIGHT paratracheal nodal metastasis. 3. No evidence local recurrence in the LEFT lower lobe. 4. Metabolic activity associated with the musculature along the LEFT costovertebral junction is favored behind musculoskeletal activity. Electronically Signed   By: Suzy Bouchard M.D.   On: 01/12/2017 14:40   [place] Core Biopsy (lymph Nodes)  Result Date: 01/28/2017 INDICATION: History of lung cancer, now with hypermetabolic left supraclavicular lymph node. Please perform ultrasound-guided lymph node biopsy for tissue diagnostic purposes. EXAM: ULTRASOUND-GUIDED BIOPSY OF HYPERMETABOLIC LEFT SUPRACLAVICULAR LYMPH NODE COMPARISON:  PET-CT - 01/12/2017 MEDICATIONS: None ANESTHESIA/SEDATION: Moderate (conscious) sedation was employed during this procedure. A total of Versed 4  mg and Fentanyl 200 mcg was administered intravenously. Moderate Sedation Time: 23 minutes. The patient's level of consciousness and vital signs were monitored continuously by radiology nursing throughout the procedure under my direct supervision. COMPLICATIONS: None immediate. TECHNIQUE: Informed written consent was obtained from the patient after a discussion of the risks, benefits and alternatives to treatment. Questions regarding the procedure were encouraged and answered. Initial ultrasound scanning demonstrated an approximately 1.7 x 1.5 cm lymph node within the left supraclavicular fossa (image 6) correlating with the hypermetabolic lymph node seen on preceding PET-CT. An ultrasound image was saved for documentation purposes. The procedure was planned. A timeout was performed prior to the initiation of the procedure. The operative was prepped and draped in the usual sterile fashion, and a sterile drape was applied covering the operative field. A timeout was performed prior to the initiation of the procedure. Local anesthesia was provided with 1% lidocaine with epinephrine. Under direct ultrasound guidance, an 18 gauge core needle device was utilized to obtain to obtain 5 core needle biopsies of the left subclavicular lymph node. The samples were placed in saline and submitted to pathology. The needle was removed and hemostasis was achieved with manual compression. Post procedure scan was negative for significant hematoma. A dressing was placed. The patient tolerated the procedure well without immediate postprocedural complication. IMPRESSION: Technically successful ultrasound guided biopsy of left supraclavicular lymph node. Electronically Signed   By: Sandi Mariscal M.D.   On: 01/28/2017 14:43  ASSESSMENT AND PLAN: This is a 66 years old white male with history of stage IIIa non-small cell lung cancer, adenocarcinoma status post left lower lobectomy with lymph node dissection followed by a course of  concurrent chemoradiation completed in January 2016. He has been observation since that time. Recent imaging studies including a PET scan showed metastatic disease in the left femur as well as left supraclavicular lymphadenopathy and right paratracheal lymph nodes. Ultrasound-guided core biopsy of the left supraclavicular lymph node was consistent with metastatic adenocarcinoma. I requested the tissue block to be sent for molecular studies and PDL 1 expression. The patient completed a palliative course of radiotherapy to the left femur.  He was also evaluated by orthopedic surgery for consideration of nail placement to avoid any fracture of the femur. I discussion with the patient and his family about his current condition and treatment options.  I recommended for the patient to proceed with the surgery for his femur soon. I will see him back for follow-up visit in 3 weeks for evaluation and more detailed discussion of his systemic treatment options based on the molecular studies and PDL 1. For pain management he will continue his current treatment with oxycodone for now.  He was also advised to take stool softener or laxative to avoid any constipation. The patient was advised to call immediately if he has any concerning symptoms in the interval. The patient voices understanding of current disease status and treatment options and is in agreement with the current care plan.  All questions were answered. The patient knows to call the clinic with any problems, questions or concerns. We can certainly see the patient much sooner if necessary.  Disclaimer: This note was dictated with voice recognition software. Similar sounding words can inadvertently be transcribed and may not be corrected upon review.

## 2017-02-07 ENCOUNTER — Other Ambulatory Visit: Payer: Self-pay | Admitting: Orthopedic Surgery

## 2017-02-13 ENCOUNTER — Ambulatory Visit (HOSPITAL_COMMUNITY): Payer: Medicare Other

## 2017-02-13 ENCOUNTER — Other Ambulatory Visit: Payer: Medicare Other

## 2017-02-15 NOTE — Progress Notes (Signed)
  Radiation Oncology         [phone]) [phone] ________________________________  Name: Victor Huber MRN: [id number]  Date: 02/01/2017  DOB: [birth year]  End of Treatment Note  Diagnosis:   66 y.o. male with recurrent metastatic Stage IIIA, T2aN2M0, NSCLC, adenocarcinoma of the left lower lobe with probable metastatic disease to the left femur     Indication for treatment::  Palliative       Radiation treatment dates:   01/23/2017 - 02/01/2017  Site/dose:   The left femur was treated to 28 Gy in 8 fractions of 3.5 Gy.  Narrative: The patient tolerated radiation treatment relatively well.   He reported 10/10 pain to his left femur that would keep him awake at night. He took hydrocodone and 2 ibuprofen with no relief. He described the pain as extending from his left lower leg to his left hip. He was given a prescription for Percocet which seemed to help improve his pain.  Plan: The patient has completed radiation treatment. His pain has improved. The patient will return to radiation oncology clinic for routine followup in one month. I advised the patient to call or return sooner if they have any questions or concerns related to their recovery or treatment. ________________________________  ------------------------------------------------  Jodelle Gross, MD, PhD  This document serves as a record of services personally performed by Kyung Rudd, MD. It was created on his behalf by Rae Lips, a trained medical scribe. The creation of this record is based on the scribe's personal observations and the provider's statements to them. This document has been checked and approved by the attending provider.

## 2017-02-15 NOTE — Pre-Procedure Instructions (Signed)
Elmo Rio  02/15/2017      [facility] - [place], [place] [address] Phone: [phone] Fax: [phone]  [id number] [place], [place] [address] Phone: [phone] Fax: [phone]    Your procedure is scheduled on Tuesday, February 19, 2017  Report to [facility] Admitting Entrance "A" at 8:30A.M.   Call this number if you have problems the morning of surgery:  [phone]   Remember:  Do not eat food or drink liquids after midnight.  Take these medicines the morning of surgery with A SIP OF WATER: Amoxicillin (AMOXIL), Atenolol (TENORMIN), Citalopram (CELEXA), Levothyroxine (SYNTHROID, LEVOTHROID), and Tamsulosin (FLOMAX). If needed HYDROcodone-acetaminophen [facility]) for pain and LORazepam (ATIVAN) for anxiety.  Follow your doctor's instruction regarding Eliquis.  As of today, stop taking all Aspirins, Vitamins, Fish oils, and Herbal medications. Also stop all NSAIDS i.e. Advil, Ibuprofen, Motrin, Aleve, Anaprox, Naproxen, BC and Goody Powders.   Do not wear jewelry.  Do not wear lotions, powders, colognes, or deodorant.  Do not shave 48 hours prior to surgery.  Men may shave face and neck.  Do not bring valuables to the hospital.  [facility] is not responsible for any belongings or valuables.  Contacts, dentures or bridgework may not be worn into surgery.  Leave your suitcase in the car.  After surgery it may be brought to your room.  For patients admitted to the hospital, discharge time will be determined by your treatment team.  Patients discharged the day of surgery will not be allowed to drive home.   Special instructions:   [place]- Preparing For Surgery  Before surgery, you can play an important role. Because skin is not sterile, your skin needs to be as free of germs as possible. You can reduce the number  of germs on your skin by washing with CHG (chlorahexidine gluconate) Soap before surgery.  CHG is an antiseptic cleaner which kills germs and bonds with the skin to continue killing germs even after washing.  Please do not use if you have an allergy to CHG or antibacterial soaps. If your skin becomes reddened/irritated stop using the CHG.  Do not shave (including legs and underarms) for at least 48 hours prior to first CHG shower. It is OK to shave your face.  Please follow these instructions carefully.   1. Shower the NIGHT BEFORE SURGERY and the MORNING OF SURGERY with CHG.   2. If you chose to wash your hair, wash your hair first as usual with your normal shampoo.  3. After you shampoo, rinse your hair and body thoroughly to remove the shampoo.  4. Use CHG as you would any other liquid soap. You can apply CHG directly to the skin and wash gently with a scrungie or a clean washcloth.   5. Apply the CHG Soap to your body ONLY FROM THE NECK DOWN.  Do not use on open wounds or open sores. Avoid contact with your eyes, ears, mouth and genitals (private parts). Wash Face and genitals (private parts)  with your normal soap.  6. Wash thoroughly, paying special attention to the area where your surgery will be performed.  7. Thoroughly rinse your body with warm water from the neck down.  8. DO NOT shower/wash with your normal soap after using and rinsing off the CHG Soap.  9. Pat yourself dry with a CLEAN  TOWEL.  10. Wear CLEAN PAJAMAS to bed the night before surgery, wear comfortable clothes the morning of surgery  11. Place CLEAN SHEETS on your bed the night of your first shower and DO NOT SLEEP WITH PETS.  Day of Surgery: Do not apply any deodorants/lotions. Please wear clean clothes to the hospital/surgery center.    Please read over the following fact sheets that you were given. Pain Booklet, Coughing and Deep Breathing, MRSA Information and Surgical Site Infection  Prevention

## 2017-02-18 ENCOUNTER — Other Ambulatory Visit: Payer: Self-pay

## 2017-02-18 ENCOUNTER — Encounter (HOSPITAL_COMMUNITY): Payer: Self-pay

## 2017-02-18 ENCOUNTER — Encounter (HOSPITAL_COMMUNITY)
Admission: RE | Admit: 2017-02-18 | Discharge: 2017-02-18 | Disposition: A | Discharge status: Home / Self Care | Payer: Medicare Other | Source: Ambulatory Visit | Attending: Orthopedic Surgery | Admitting: Orthopedic Surgery

## 2017-02-18 HISTORY — DX: Presence of spectacles and contact lenses: Z97.3

## 2017-02-18 HISTORY — DX: Pneumonia, unspecified organism: J18.9

## 2017-02-18 HISTORY — DX: Pathological fracture, unspecified site, initial encounter for fracture: M84.40XA

## 2017-02-18 HISTORY — DX: Presence of external hearing-aid: Z97.4

## 2017-02-18 HISTORY — DX: Unspecified osteoarthritis, unspecified site: M19.90

## 2017-02-18 HISTORY — DX: Other disorders of lung: J98.4

## 2017-02-18 LAB — BASIC METABOLIC PANEL
ANION GAP: 6 (ref 5–15)
BUN: 17 mg/dL (ref 6–20)
CHLORIDE: 105 mmol/L (ref 101–111)
CO2: 28 mmol/L (ref 22–32)
Calcium: 9.3 mg/dL (ref 8.9–10.3)
Creatinine, Ser: 0.9 mg/dL (ref 0.61–1.24)
GFR calc Af Amer: >60 mL/min (ref >60)
GFR calc non Af Amer: >60 mL/min (ref >60)
GLUCOSE: 102 mg/dL — AB (ref 65–99)
POTASSIUM: 4.3 mmol/L (ref 3.5–5.1)
Sodium: 139 mmol/L (ref 135–145)

## 2017-02-18 LAB — PROTIME-INR
INR: 1.07
Prothrombin Time: 13.8 seconds (ref 11.4–15.2)

## 2017-02-18 MED ORDER — CEFAZOLIN SODIUM-DEXTROSE 2-4 GM/100ML-% IV SOLN
2.0000 g | INTRAVENOUS | Status: DC
  Filled 2017-02-18: qty 100

## 2017-02-18 NOTE — Pre-Procedure Instructions (Signed)
Victor Huber  02/18/2017      [facility] - [place], [place] [address] Phone: [phone] Fax: [phone]  [id number] [place], [place] [address] Phone: [phone] Fax: [phone]    Your procedure is scheduled on Tuesday, February 19, 2017  Report to [facility] Admitting Entrance "A" at 8:30A.M.   Call this number if you have problems the morning of surgery:  [phone]   Remember:  Do not eat food or drink liquids after midnight.  Take these medicines the morning of surgery with A SIP OF WATER:  Atenolol (TENORMIN), Citalopram (CELEXA), Levothyroxine (SYNTHROID, LEVOTHROID), and Tamsulosin (FLOMAX). If needed: medication for pain and LORazepam (ATIVAN) for anxiety.  Follow your doctor's instruction regarding Eliquis.  As of today, stop taking all Aspirins, Vitamins, Fish oils, and Herbal medications. Also stop all NSAIDS i.e. Advil, Ibuprofen, Motrin, Aleve, Anaprox, Naproxen, BC and Goody Powders.   Do not wear jewelry.  Do not wear lotions, powders, colognes, or deodorant.  Do not shave 48 hours prior to surgery.  Men may shave face and neck.  Do not bring valuables to the hospital.  [facility] is not responsible for any belongings or valuables.  Contacts, dentures or bridgework may not be worn into surgery.  Leave your suitcase in the car.  After surgery it may be brought to your room.  For patients admitted to the hospital, discharge time will be determined by your treatment team.  Patients discharged the day of surgery will not be allowed to drive home.   Special instructions:   [place]- Preparing For Surgery  Before surgery, you can play an important role. Because skin is not sterile, your skin needs to be as free of germs as possible. You can reduce the number of germs on your skin by washing with  CHG (chlorahexidine gluconate) Soap before surgery.  CHG is an antiseptic cleaner which kills germs and bonds with the skin to continue killing germs even after washing.  Please do not use if you have an allergy to CHG or antibacterial soaps. If your skin becomes reddened/irritated stop using the CHG.  Do not shave (including legs and underarms) for at least 48 hours prior to first CHG shower. It is OK to shave your face.  Please follow these instructions carefully.   1. Shower the NIGHT BEFORE SURGERY and the MORNING OF SURGERY with CHG.   2. If you chose to wash your hair, wash your hair first as usual with your normal shampoo.  3. After you shampoo, rinse your hair and body thoroughly to remove the shampoo.  4. Use CHG as you would any other liquid soap. You can apply CHG directly to the skin and wash gently with a scrungie or a clean washcloth.   5. Apply the CHG Soap to your body ONLY FROM THE NECK DOWN.  Do not use on open wounds or open sores. Avoid contact with your eyes, ears, mouth and genitals (private parts). Wash Face and genitals (private parts)  with your normal soap.  6. Wash thoroughly, paying special attention to the area where your surgery will be performed.  7. Thoroughly rinse your body with warm water from the neck down.  8. DO NOT shower/wash with your normal soap after using and rinsing off the CHG Soap.  9. Pat yourself dry with a CLEAN TOWEL.  10. Wear CLEAN PAJAMAS to bed the night before surgery, wear comfortable clothes the morning of surgery  11. Place CLEAN SHEETS on your bed the night of your first shower and DO NOT SLEEP WITH PETS.  Day of Surgery: Do not apply any deodorants/lotions. Please wear clean clothes to the hospital/surgery center.    Please read over the following fact sheets that you were given. Pain Booklet, Coughing and Deep Breathing and Surgical Site Infection Prevention

## 2017-02-18 NOTE — Progress Notes (Signed)
Pt denies any acute cardiopulmonary issues. Pt under the care of Dr. Skeet Latch, Cardiology. Pt denies having a chest x ray within the last year.Pt stated that last dose of Eliquis was Saturday night. Anesthesia advised that BMP be repeated and PT INR be drawn. Anesthesia made aware of pt history.

## 2017-02-18 NOTE — Progress Notes (Signed)
Anesthesia Chart Review: Patient is a 66 year old male scheduled for IM nail, left femoral on 02/19/17 by Dr. Marchia Bond. (History of metastatic lung cancer.) PAT was at 3:15 PM on 02/18/17.  History includes former smoker (quit 08/15/12), afib (s/p unsuccessful DCCV X 2; WCT/cardiac arrest on flecainide s/p defibrillation '14) , COPD, TURP, hypothyroidism, HLD, non-small cell lung cancer (diagnosed '16) s/p LLL wedge resection (with residual metastatic mediastinal lymphadenopathy that could not be resected) s/p chemoradiation (completed 03/01/15) with 01/12/17 PET scan showing metastatic disease in the left femur, left supraclavicular and right paratracheal nodes (01/28/17 left cervical LN biopsy consistent with metastatic adenocarcinoma) s/p palliative radiotherapy to the left femur (01/23/17-02/01/17).  - PCP is Dr. [name]. - Hem-Onc is Dr. Curt Bears. Rad-Onc is Dr. Kyung Rudd. - Cardiologist is Dr. Skeet Latch, first established on 08/29/16 for continued follow-up for afib. He had moved from [place] 3 months prior. She referred patient to the afib clinic. Last visit 12/19/16 with one year follow-up recommended. On 01/18/17, Kerin Ransom, PA-C wrote, ".. based on ACC/AHA guidelines, Luverne Shellhammer would be at acceptable risk for the planned procedure without further cardiovascular testing. OK to hold Eliquis 48 hours pre op."  - AF cardiologist is Dr. Thompson Grayer with actual visit with Roderic Palau, ANP-C on 09/27/16.  - Pulmonologist is Dr. Tera Partridge, last visit 09/26/16. Notes indicate that he was going to complete a letter of pulmonary clearance once form received from surgeon.   BP 105/70   Pulse 93   Temp (!) 36.3 C   Resp 18   Ht 6\' 1"  (1.854 m)   Wt 255 lb (115.7 kg)   SpO2 97%   BMI 33.64 kg/m   Meds include Eliquis (last dose 02/16/17 PM), atenolol, Lipitor, Celexa, Norco, levothyroxine, Ativan, oxycodone, Flomax.   EKG 10/31/16: Afib at 97 bpm, rightward  axis, incomplete right BBB, non-specific ST abnormality, probably digitalis effect.  TEE 01/02/16 [facility]; scanned under Results Review tab):  Normal left ventricular chamber size. Mildly reduced left ventricular systolic function. Very mild hypokinesis. Left ventricular ejection fraction is 45-50% visually. Large right ventricular size. Normal right ventricular systolic function. Moderately dilated right atrium. Mildly dilated left atrium. Normal left atrial appendage. Normal intra-atrial septum. Bubble study performed with no sign of shunt. Trace mitral regurgitation. Trace aortic insufficiency. Trace tricuspid regurgitation. Normal pericardium without effusion. Normal aortic root. No cardiac source of embolism seen.  Echo 06/10/15 [facility]; scanned under Results Review tab): Conclusions: Technically difficult study. Normal left ventricular chamber size. Mild concentric left ventricular hypertrophy. Left ventricular ejection fraction is 50-55%. Trace mitral regurgitation. Mitral valve opens normally. Right heart pressures were normal.  48 Holter monitor 01/30/16 [facility]; scanned under Results Review tab): The patient was in atrial fibrillation throughout with an average ventricular rate of 92 bpm (range from 45-185). The longest pause was 2.02 seconds. Ventricular ectopy consisted of rare PVCs averaging 2.2 per hour. There was one ventricular couplet. No episodes of wide-complex tachycardia. Complaints of "shortness of breath" were associated with atrial fibrillation with ventricular rates from 118-1 77 bpm.  ETT 05/12/12 [facility]; scanned under Results Review tab): Final impression: Stress test negative for any significant arrhythmias and negative for ischemia.  Cardiac cath 05/07/12 (done for cardiac arrest; [facility]; scanned under Results Review tab):  Left main: Normal. LAD: Luminal irregularities including a diagonal. CX: Codominant. Circumflex and ramus contain  luminal irregularities. RCA: Codominant. Luminal irregularities. Conclusion: Nonobstructive coronary atherosclerosis.  Chest CT 08/30/16:  IMPRESSION: 1. No substantial change in exam when comparing back to the outside study from 02/20/2016. 2. Postsurgical and post radiation change identified infrahilar left lower lung. 3. Upper normal mediastinal lymph nodes stable since prior study. 4. Emphysema with coronary artery and thoracic aortic atherosclerosis.  PFTs 04/10/16 [facility]; scanned under Results Review tab): FVC 3.70 (79%), FEV1 2.36 (64%), DLCO 27.4 (59%).   He had labs on 02/15/17. CBC WNL. K 5.4 (no mention of hemolysis). Cr 0.9. Will recheck BMET today and go a head and check PT/INR (last Eliquis 02/16/17). Labs are still pending currently. PAT RN to follow-up after 5 PM and review with physician if appropriate.  If labs acceptable and otherwise no acute changes then I would anticipate that he can proceed as planned. Anesthesia is posted as Choice. Last Eliquis 02/16/17 PM.  Myra Gianotti, PA-C [facility] Short Stay Center/Anesthesiology Phone [phone] 02/18/2017 4:58 PM

## 2017-02-19 ENCOUNTER — Encounter (HOSPITAL_COMMUNITY)
Admission: RE | Disposition: A | Discharge status: Home Health | Payer: Self-pay | Source: Ambulatory Visit | Attending: Orthopedic Surgery

## 2017-02-19 ENCOUNTER — Encounter (HOSPITAL_COMMUNITY): Payer: Self-pay | Admitting: *Deleted

## 2017-02-19 ENCOUNTER — Inpatient Hospital Stay (HOSPITAL_COMMUNITY): Payer: Medicare Other

## 2017-02-19 ENCOUNTER — Inpatient Hospital Stay (HOSPITAL_COMMUNITY): Payer: Medicare Other | Admitting: Vascular Surgery

## 2017-02-19 ENCOUNTER — Other Ambulatory Visit: Payer: Self-pay

## 2017-02-19 ENCOUNTER — Inpatient Hospital Stay (HOSPITAL_COMMUNITY): Payer: Medicare Other | Admitting: Anesthesiology

## 2017-02-19 ENCOUNTER — Inpatient Hospital Stay (HOSPITAL_COMMUNITY)
Admission: RE | Admit: 2017-02-19 | Discharge: 2017-02-20 | DRG: 481 | Disposition: A | Discharge status: Home Health | Payer: Medicare Other | Source: Ambulatory Visit | Attending: Orthopedic Surgery | Admitting: Orthopedic Surgery

## 2017-02-19 DIAGNOSIS — I481 Persistent atrial fibrillation: Secondary | ICD-10-CM | POA: Diagnosis present

## 2017-02-19 DIAGNOSIS — Z923 Personal history of irradiation: Secondary | ICD-10-CM | POA: Diagnosis not present

## 2017-02-19 DIAGNOSIS — Z79899 Other long term (current) drug therapy: Secondary | ICD-10-CM | POA: Diagnosis not present

## 2017-02-19 DIAGNOSIS — E039 Hypothyroidism, unspecified: Secondary | ICD-10-CM | POA: Diagnosis present

## 2017-02-19 DIAGNOSIS — M1990 Unspecified osteoarthritis, unspecified site: Secondary | ICD-10-CM | POA: Diagnosis present

## 2017-02-19 DIAGNOSIS — Z9221 Personal history of antineoplastic chemotherapy: Secondary | ICD-10-CM

## 2017-02-19 DIAGNOSIS — E785 Hyperlipidemia, unspecified: Secondary | ICD-10-CM | POA: Diagnosis present

## 2017-02-19 DIAGNOSIS — Z85118 Personal history of other malignant neoplasm of bronchus and lung: Secondary | ICD-10-CM

## 2017-02-19 DIAGNOSIS — Z419 Encounter for procedure for purposes other than remedying health state, unspecified: Secondary | ICD-10-CM

## 2017-02-19 DIAGNOSIS — C7951 Secondary malignant neoplasm of bone: Secondary | ICD-10-CM | POA: Diagnosis present

## 2017-02-19 DIAGNOSIS — J449 Chronic obstructive pulmonary disease, unspecified: Secondary | ICD-10-CM | POA: Diagnosis present

## 2017-02-19 DIAGNOSIS — Z7901 Long term (current) use of anticoagulants: Secondary | ICD-10-CM

## 2017-02-19 DIAGNOSIS — Z888 Allergy status to other drugs, medicaments and biological substances status: Secondary | ICD-10-CM | POA: Diagnosis not present

## 2017-02-19 DIAGNOSIS — Z9889 Other specified postprocedural states: Secondary | ICD-10-CM

## 2017-02-19 DIAGNOSIS — Z8781 Personal history of (healed) traumatic fracture: Secondary | ICD-10-CM

## 2017-02-19 DIAGNOSIS — I2510 Atherosclerotic heart disease of native coronary artery without angina pectoris: Secondary | ICD-10-CM | POA: Diagnosis present

## 2017-02-19 DIAGNOSIS — Z87891 Personal history of nicotine dependence: Secondary | ICD-10-CM

## 2017-02-19 HISTORY — PX: FEMUR IM NAIL: SHX1597

## 2017-02-19 SURGERY — INSERTION, INTRAMEDULLARY ROD, FEMUR
Anesthesia: Spinal | Site: Leg Upper | Laterality: Left

## 2017-02-19 MED ORDER — FERROUS SULFATE 325 (65 FE) MG PO TABS
325.0000 mg | ORAL_TABLET | Freq: Three times a day (TID) | ORAL | Status: DC
  Administered 2017-02-19 – 2017-02-20 (×2): 325 mg via ORAL
  Filled 2017-02-19 (×2): qty 1

## 2017-02-19 MED ORDER — PROPOFOL 500 MG/50ML IV EMUL
INTRAVENOUS | Status: DC | PRN
  Administered 2017-02-19: 75 ug/kg/min via INTRAVENOUS

## 2017-02-19 MED ORDER — TAMSULOSIN HCL 0.4 MG PO CAPS
0.4000 mg | ORAL_CAPSULE | Freq: Every day | ORAL | Status: DC
  Administered 2017-02-19 – 2017-02-20 (×2): 0.4 mg via ORAL
  Filled 2017-02-19 (×2): qty 1

## 2017-02-19 MED ORDER — MEPERIDINE HCL 25 MG/ML IJ SOLN: 6.2500 mg | INTRAMUSCULAR | Status: DC | PRN

## 2017-02-19 MED ORDER — ATORVASTATIN CALCIUM 40 MG PO TABS: 40.0000 mg | ORAL_TABLET | Freq: Every day | ORAL | Status: DC

## 2017-02-19 MED ORDER — LORAZEPAM 0.5 MG PO TABS: 0.5000 mg | ORAL_TABLET | Freq: Every day | ORAL | Status: DC | PRN

## 2017-02-19 MED ORDER — APIXABAN 5 MG PO TABS: 5.0000 mg | ORAL_TABLET | Freq: Two times a day (BID) | ORAL | Status: DC

## 2017-02-19 MED ORDER — OXYCODONE HCL 5 MG PO TABS: 5.0000 mg | ORAL_TABLET | ORAL | 0 refills | Status: DC | PRN

## 2017-02-19 MED ORDER — 0.9 % SODIUM CHLORIDE (POUR BTL) OPTIME
TOPICAL | Status: DC | PRN
  Administered 2017-02-19: 1000 mL

## 2017-02-19 MED ORDER — MAGNESIUM CITRATE PO SOLN: 1.0000 | Freq: Once | ORAL | Status: DC | PRN

## 2017-02-19 MED ORDER — CEFAZOLIN SODIUM-DEXTROSE 2-3 GM-%(50ML) IV SOLR
INTRAVENOUS | Status: DC | PRN
  Administered 2017-02-19: 2 g via INTRAVENOUS

## 2017-02-19 MED ORDER — ONDANSETRON HCL 4 MG PO TABS: 4.0000 mg | ORAL_TABLET | Freq: Four times a day (QID) | ORAL | Status: DC | PRN

## 2017-02-19 MED ORDER — IBUPROFEN 400 MG PO TABS: 400.0000 mg | ORAL_TABLET | ORAL | Status: DC | PRN

## 2017-02-19 MED ORDER — ONDANSETRON HCL 4 MG/2ML IJ SOLN: 4.0000 mg | Freq: Once | INTRAMUSCULAR | Status: DC | PRN

## 2017-02-19 MED ORDER — PHENOL 1.4 % MT LIQD: 1.0000 | OROMUCOSAL | Status: DC | PRN

## 2017-02-19 MED ORDER — FENTANYL CITRATE (PF) 250 MCG/5ML IJ SOLN
INTRAMUSCULAR | Status: AC
  Filled 2017-02-19: qty 5

## 2017-02-19 MED ORDER — LEVOTHYROXINE SODIUM 75 MCG PO TABS
225.0000 ug | ORAL_TABLET | Freq: Every day | ORAL | Status: DC
  Administered 2017-02-20: 225 ug via ORAL
  Filled 2017-02-19: qty 3

## 2017-02-19 MED ORDER — OXYCODONE HCL 5 MG PO TABS
10.0000 mg | ORAL_TABLET | ORAL | Status: DC | PRN
  Administered 2017-02-19 (×2): 10 mg via ORAL
  Filled 2017-02-19 (×2): qty 2

## 2017-02-19 MED ORDER — PROPOFOL 10 MG/ML IV BOLUS
INTRAVENOUS | Status: DC | PRN
  Administered 2017-02-19: 30 mg via INTRAVENOUS

## 2017-02-19 MED ORDER — ONDANSETRON HCL 4 MG/2ML IJ SOLN
INTRAMUSCULAR | Status: DC | PRN
  Administered 2017-02-19: 4 mg via INTRAVENOUS

## 2017-02-19 MED ORDER — PROPOFOL 10 MG/ML IV BOLUS
INTRAVENOUS | Status: AC
  Filled 2017-02-19: qty 20

## 2017-02-19 MED ORDER — BACLOFEN 10 MG PO TABS: 10.0000 mg | ORAL_TABLET | Freq: Three times a day (TID) | ORAL | 0 refills | Status: DC

## 2017-02-19 MED ORDER — SODIUM CHLORIDE 0.9 % IV SOLN
75.0000 mL/h | INTRAVENOUS | Status: DC
  Administered 2017-02-19: 75 mL/h via INTRAVENOUS

## 2017-02-19 MED ORDER — ACETAMINOPHEN 325 MG PO TABS
650.0000 mg | ORAL_TABLET | Freq: Four times a day (QID) | ORAL | Status: DC | PRN
  Administered 2017-02-19: 650 mg via ORAL
  Filled 2017-02-19: qty 2

## 2017-02-19 MED ORDER — BUPIVACAINE IN DEXTROSE 0.75-8.25 % IT SOLN
INTRATHECAL | Status: DC | PRN
  Administered 2017-02-19: 15 mg via INTRATHECAL

## 2017-02-19 MED ORDER — BISACODYL 10 MG RE SUPP: 10.0000 mg | Freq: Every day | RECTAL | Status: DC | PRN

## 2017-02-19 MED ORDER — ONDANSETRON HCL 4 MG/2ML IJ SOLN: 4.0000 mg | Freq: Four times a day (QID) | INTRAMUSCULAR | Status: DC | PRN

## 2017-02-19 MED ORDER — LACTATED RINGERS IV SOLN
INTRAVENOUS | Status: DC | PRN
  Administered 2017-02-19 (×2): via INTRAVENOUS

## 2017-02-19 MED ORDER — DOCUSATE SODIUM 100 MG PO CAPS
100.0000 mg | ORAL_CAPSULE | Freq: Two times a day (BID) | ORAL | Status: DC
  Administered 2017-02-19 – 2017-02-20 (×2): 100 mg via ORAL
  Filled 2017-02-19 (×2): qty 1

## 2017-02-19 MED ORDER — FENTANYL CITRATE (PF) 100 MCG/2ML IJ SOLN: 25.0000 ug | INTRAMUSCULAR | Status: DC | PRN

## 2017-02-19 MED ORDER — MENTHOL 3 MG MT LOZG: 1.0000 | LOZENGE | OROMUCOSAL | Status: DC | PRN

## 2017-02-19 MED ORDER — AMOXICILLIN 500 MG PO CAPS
500.0000 mg | ORAL_CAPSULE | Freq: Two times a day (BID) | ORAL | Status: DC
Start: 2017-02-20 — End: 2017-02-20
  Filled 2017-02-19: qty 1

## 2017-02-19 MED ORDER — POLYETHYLENE GLYCOL 3350 17 G PO PACK: 17.0000 g | PACK | Freq: Every day | ORAL | Status: DC | PRN

## 2017-02-19 MED ORDER — ATENOLOL 50 MG PO TABS
50.0000 mg | ORAL_TABLET | Freq: Every day | ORAL | Status: DC
  Administered 2017-02-20: 50 mg via ORAL
  Filled 2017-02-19: qty 1

## 2017-02-19 MED ORDER — AMOXICILLIN 500 MG PO CAPS: 500.0000 mg | ORAL_CAPSULE | Freq: Two times a day (BID) | ORAL | Status: DC

## 2017-02-19 MED ORDER — LACTATED RINGERS IV SOLN
INTRAVENOUS | Status: DC
  Administered 2017-02-19: 08:00:00 via INTRAVENOUS

## 2017-02-19 MED ORDER — CITALOPRAM HYDROBROMIDE 20 MG PO TABS
40.0000 mg | ORAL_TABLET | Freq: Every day | ORAL | Status: DC
  Administered 2017-02-20: 40 mg via ORAL
  Filled 2017-02-19: qty 2

## 2017-02-19 MED ORDER — OXYCODONE HCL 5 MG PO TABS
20.0000 mg | ORAL_TABLET | ORAL | Status: DC | PRN
  Administered 2017-02-20 (×3): 20 mg via ORAL
  Filled 2017-02-19 (×3): qty 4

## 2017-02-19 MED ORDER — SENNA 8.6 MG PO TABS
1.0000 | ORAL_TABLET | Freq: Two times a day (BID) | ORAL | Status: DC
  Administered 2017-02-19 – 2017-02-20 (×2): 8.6 mg via ORAL
  Filled 2017-02-19 (×2): qty 1

## 2017-02-19 MED ORDER — LEVOTHYROXINE SODIUM 100 MCG PO TABS: 200.0000 ug | ORAL_TABLET | Freq: Every day | ORAL | Status: DC

## 2017-02-19 MED ORDER — ALUM & MAG HYDROXIDE-SIMETH 200-200-20 MG/5ML PO SUSP: 30.0000 mL | ORAL | Status: DC | PRN

## 2017-02-19 MED ORDER — ONDANSETRON HCL 4 MG PO TABS: 4.0000 mg | ORAL_TABLET | Freq: Three times a day (TID) | ORAL | 0 refills | Status: DC | PRN

## 2017-02-19 MED ORDER — PHENYLEPHRINE HCL 10 MG/ML IJ SOLN
INTRAVENOUS | Status: DC | PRN
  Administered 2017-02-19: 25 ug/min via INTRAVENOUS

## 2017-02-19 MED ORDER — LIDOCAINE HCL (CARDIAC) 20 MG/ML IV SOLN
INTRAVENOUS | Status: DC | PRN
  Administered 2017-02-19: 30 mg via INTRAVENOUS

## 2017-02-19 MED ORDER — LEVOTHYROXINE SODIUM 25 MCG PO TABS: 25.0000 ug | ORAL_TABLET | Freq: Every day | ORAL | Status: DC

## 2017-02-19 MED ORDER — OXYCODONE HCL 10 MG PO TABS: 10.0000 mg | ORAL_TABLET | ORAL | Status: DC | PRN

## 2017-02-19 MED ORDER — ACETAMINOPHEN 650 MG RE SUPP: 650.0000 mg | Freq: Four times a day (QID) | RECTAL | Status: DC | PRN

## 2017-02-19 MED ORDER — HYDROMORPHONE HCL 1 MG/ML IJ SOLN
1.0000 mg | INTRAMUSCULAR | Status: DC | PRN
  Administered 2017-02-19 (×3): 1 mg via INTRAVENOUS
  Filled 2017-02-19 (×3): qty 1

## 2017-02-19 MED ORDER — CEFAZOLIN SODIUM-DEXTROSE 2-4 GM/100ML-% IV SOLN
2.0000 g | Freq: Four times a day (QID) | INTRAVENOUS | Status: AC
  Administered 2017-02-19: 2 g via INTRAVENOUS
  Filled 2017-02-19 (×2): qty 100

## 2017-02-19 MED ORDER — STERILE WATER FOR IRRIGATION IR SOLN
Status: DC | PRN
  Administered 2017-02-19: 1000 mL

## 2017-02-19 MED ORDER — SENNA-DOCUSATE SODIUM 8.6-50 MG PO TABS: 2.0000 | ORAL_TABLET | Freq: Every day | ORAL | 1 refills | Status: DC

## 2017-02-19 MED ORDER — CHLORHEXIDINE GLUCONATE 4 % EX LIQD: 60.0000 mL | Freq: Once | CUTANEOUS | Status: DC

## 2017-02-19 SURGICAL SUPPLY — 49 items
BENZOIN TINCTURE PRP APPL 2/3 (GAUZE/BANDAGES/DRESSINGS) IMPLANT
BIT DRILL 4.3MMS DISTAL GRDTED (BIT) ×1 IMPLANT
BLADE SURG 10 STRL SS (BLADE) ×3 IMPLANT
BOOTCOVER CLEANROOM LRG (PROTECTIVE WEAR) ×6 IMPLANT
CLOSURE STERI-STRIP 1/2X4 (GAUZE/BANDAGES/DRESSINGS) ×1
CLSR STERI-STRIP ANTIMIC 1/2X4 (GAUZE/BANDAGES/DRESSINGS) ×2 IMPLANT
CONT SPEC 4OZ CLIKSEAL STRL BL (MISCELLANEOUS) ×3 IMPLANT
COVER MAYO STAND STRL (DRAPES) ×3 IMPLANT
COVER PERINEAL POST (MISCELLANEOUS) ×3 IMPLANT
COVER SURGICAL LIGHT HANDLE (MISCELLANEOUS) ×3 IMPLANT
DRAPE INCISE IOBAN 66X45 STRL (DRAPES) ×3 IMPLANT
DRAPE STERI IOBAN 125X83 (DRAPES) ×3 IMPLANT
DRILL 4.3MMS DISTAL GRADUATED (BIT) ×3
DRSG MEPILEX BORDER 4X4 (GAUZE/BANDAGES/DRESSINGS) ×9 IMPLANT
DRSG MEPILEX BORDER 4X8 (GAUZE/BANDAGES/DRESSINGS) IMPLANT
DURAPREP 26ML APPLICATOR (WOUND CARE) ×3 IMPLANT
ELECT CAUTERY BLADE 6.4 (BLADE) ×3 IMPLANT
ELECT REM PT RETURN 9FT ADLT (ELECTROSURGICAL) ×3
ELECTRODE REM PT RTRN 9FT ADLT (ELECTROSURGICAL) ×1 IMPLANT
EVACUATOR 1/8 PVC DRAIN (DRAIN) IMPLANT
FACESHIELD WRAPAROUND (MASK) ×6 IMPLANT
GAUZE XEROFORM 5X9 LF (GAUZE/BANDAGES/DRESSINGS) IMPLANT
GLOVE BIOGEL PI ORTHO PRO SZ8 (GLOVE) ×4
GLOVE ORTHO TXT STRL SZ7.5 (GLOVE) ×6 IMPLANT
GLOVE PI ORTHO PRO STRL SZ8 (GLOVE) ×2 IMPLANT
GLOVE SURG ORTHO 8.0 STRL STRW (GLOVE) ×6 IMPLANT
GOWN STRL REUS W/ TWL XL LVL3 (GOWN DISPOSABLE) ×1 IMPLANT
GOWN STRL REUS W/TWL 2XL LVL3 (GOWN DISPOSABLE) IMPLANT
GOWN STRL REUS W/TWL XL LVL3 (GOWN DISPOSABLE) ×2
GUIDEPIN 3.2X17.5 THRD DISP (PIN) ×3 IMPLANT
GUIDEWIRE BALL NOSE 100CM (WIRE) ×3 IMPLANT
KIT ROOM TURNOVER OR (KITS) ×3 IMPLANT
LINER BOOT UNIVERSAL DISP (MISCELLANEOUS) ×3 IMPLANT
MANIFOLD NEPTUNE II (INSTRUMENTS) ×3 IMPLANT
NAIL HIP FRACT LT 130D 11X400 (Nail) ×3 IMPLANT
NS IRRIG 1000ML POUR BTL (IV SOLUTION) ×3 IMPLANT
PACK GENERAL/GYN (CUSTOM PROCEDURE TRAY) ×3 IMPLANT
PAD ARMBOARD 7.5X6 YLW CONV (MISCELLANEOUS) ×6 IMPLANT
SCREW BONE CORTICAL 5.0X50 (Screw) ×3 IMPLANT
SCREW LAG 10.5MMX105MM HFN (Screw) ×3 IMPLANT
STAPLER VISISTAT 35W (STAPLE) ×3 IMPLANT
SUT VIC AB 0 CTB1 27 (SUTURE) ×3 IMPLANT
SUT VIC AB 2-0 FS1 27 (SUTURE) ×3 IMPLANT
SUT VIC AB 2-0 SH 27 (SUTURE)
SUT VIC AB 2-0 SH 27XBRD (SUTURE) IMPLANT
SUT VIC AB 3-0 SH 8-18 (SUTURE) ×3 IMPLANT
TOWEL OR 17X24 6PK STRL BLUE (TOWEL DISPOSABLE) ×3 IMPLANT
TOWEL OR 17X26 10 PK STRL BLUE (TOWEL DISPOSABLE) ×3 IMPLANT
WATER STERILE IRR 1000ML POUR (IV SOLUTION) ×3 IMPLANT

## 2017-02-19 NOTE — Op Note (Signed)
DATE OF SURGERY:  02/19/2017  TIME: 11:57 AM  PATIENT NAME:  Victor Huber  AGE: 66 y.o.  PRE-OPERATIVE DIAGNOSIS:  MALIGNANT NEOPLASM IN LEFT femur, impending pathologic fracture  POST-OPERATIVE DIAGNOSIS:  SAME  PROCEDURE:  INTRAMEDULLARY (IM) NAIL FEMORAL  SURGEON:  Johnny Bridge  ASSISTANT:  Joya Gaskins, OPA-C, present and scrubbed throughout the case, critical for assistance with exposure, retraction, instrumentation, and closure.  OPERATIVE IMPLANTS: Biomet Affixus size 400 x 11 femoral nail with a cephalomedullary lag screw for the femoral head 105 and a distal interlocking bolt.  UNIQUE ASPECTS OF THE CASE: Bone quality was quite good in the femoral head.  Specimens: I sent reamings from the femoral canal to pathology.  ESTIMATED BLOOD LOSS: 300 mL  PREOPERATIVE INDICATIONS:  [name] is a 66 y.o. year old with metastatic cancer, that had an impending pathologic fracture in the left femur that was painful.Marland Kitchen He elected for surgical intervention.    The risks benefits and alternatives were discussed with the patient including but not limited to the risks of nonoperative treatment, versus surgical intervention including infection, bleeding, nerve injury, malunion, nonunion, hardware prominence, hardware failure, need for hardware removal, blood clots, cardiopulmonary complications, morbidity, mortality, among others, and they were willing to proceed.    OPERATIVE PROCEDURE:  The patient was brought to the operating room and placed in the supine position.  Spinal anesthesia was administered. He was placed on the fracture table.  Time out was then performed after sterile prep and drape. He received preoperative antibiotics.  Incision was made proximal to the greater trochanter. A guidewire was placed in the appropriate position. Confirmation was made on AP and lateral views. The length of the femur was also measured using fluoroscopy measuring from the tip of the  trochanter to the upper pole of the patella.    The above-named nail was opened. I opened the proximal femur with a reamer. I then placed the nail by hand down. I reamed the femur, and sent the reamings to pathology.  Once the nail was completely seated, I placed a guidepin into the femoral head into the center center position. I measured the length, and then reamed the lateral cortex and up into the head. I then placed the cephalomedullary screw.  Anatomic fixation achieved. Bone quality was reasonably good.  I then secured the proximal interlocking bolt, and then removed the instruments, and took final C-arm pictures AP and lateral the entire length of the leg.   I used a perfect circles technique to place an interlocking bolt distally.  Anatomic reconstruction was achieved, and the wounds were irrigated copiously and closed with Vicryl followed by Steri-Strips and sterile gauze for the skin. The patient was awakened and returned to PACU in stable and satisfactory condition. There were no complications and the patient tolerated the procedure well.  He will be weightbearing as tolerated, and will be on Eliquis after discharge.   Marchia Bond, M.D.

## 2017-02-19 NOTE — Anesthesia Procedure Notes (Signed)
Date/Time: 02/19/2017 10:36 AM Performed by: Eligha Bridegroom, CRNA Pre-anesthesia Checklist: Patient identified, Emergency Drugs available, Suction available, Patient being monitored and Timeout performed Patient Re-evaluated:Patient Re-evaluated prior to induction Oxygen Delivery Method: Nasal cannula Preoxygenation: Pre-oxygenation with 100% oxygen Induction Type: IV induction

## 2017-02-19 NOTE — Transfer of Care (Signed)
Immediate Anesthesia Transfer of Care Note  Patient: Victor Huber  Procedure(s) Performed: INTRAMEDULLARY (IM) NAIL FEMORAL (Left Leg Upper)  Patient Location: PACU  Anesthesia Type:Spinal  Level of Consciousness: awake and alert   Airway & Oxygen Therapy: Patient Spontanous Breathing and Patient connected to nasal cannula oxygen  Post-op Assessment: Post -op Vital signs reviewed and stable  Post vital signs: Reviewed and stable  Last Vitals:  Vitals:   02/19/17 0814 02/19/17 1212  BP: 140/74   Pulse: 90 83  Resp: 18 14  Temp: 36.6 C (!) 36.3 C  SpO2: 96% 97%    Last Pain:  Vitals:   02/19/17 1212  TempSrc:   PainSc: (P) 0-No pain      Patients Stated Pain Goal: 1 (50/56/97 9480)  Complications: No apparent anesthesia complications

## 2017-02-19 NOTE — Anesthesia Procedure Notes (Signed)
Spinal  Patient location during procedure: OR Start time: 02/19/2017 10:31 AM End time: 02/19/2017 10:38 AM Staffing Anesthesiologist: Janeece Riggers, MD Preanesthetic Checklist Completed: patient identified, site marked, surgical consent, pre-op evaluation, timeout performed, IV checked, risks and benefits discussed and monitors and equipment checked Spinal Block Patient position: sitting Prep: DuraPrep Patient monitoring: heart rate, cardiac monitor, continuous pulse ox and blood pressure Approach: midline Location: L3-4 Injection technique: single-shot Needle Needle type: Sprotte  Needle gauge: 24 G Needle length: 9 cm Assessment Sensory level: T4 Additional Notes 1st attempt at L4/5  = osso   2nd attempt at L3/4  +csf/ dose / + csf

## 2017-02-19 NOTE — Care Management Note (Signed)
Case Management Note  Patient Details  Name: Victor Huber MRN: [id number] Date of Birth: [birth year]  Subjective/Objective:                    Action/Plan:  Await PT/OT evals Expected Discharge Date:                  Expected Discharge Plan:     In-House Referral:     Discharge planning Services  CM Consult  Post Acute Care Choice:  Durable Medical Equipment, Home Health Choice offered to:     DME Arranged:    DME Agency:     HH Arranged:    HH Agency:     Status of Service:  In process, will continue to follow  If discussed at Long Length of Stay Meetings, dates discussed:    Additional Comments:  Marilu Favre, RN 02/19/2017, 2:56 PM

## 2017-02-19 NOTE — H&P (Signed)
PREOPERATIVE H&P  Chief Complaint: MALIGNANT NEOPLASM IN LEFT LEG  HPI: Victor Huber is a 66 y.o. male who presents for preoperative history and physical with a diagnosis of MALIGNANT NEOPLASM IN LEFT LEG. Symptoms are rated as moderate to severe, and have been worsening.  This is significantly impairing activities of daily living.  He has elected for surgical management.   Past Medical History:  Diagnosis Date  . Adenocarcinoma of left lung, stage 3 ([place]) 08/15/2016  . Arthritis   . Atrial fibrillation ([place]) 08/15/2016  . Atrial fibrillation ([place])   . COPD (chronic obstructive pulmonary disease) ([place]) 08/15/2016  . History of chemotherapy   . History of radiation therapy   . Hyperlipidemia   . Hypothyroid 08/15/2016  . Longstanding persistent atrial fibrillation ([place]) 08/29/2016  . Pathologic fracture    left femur  . Pneumonitis   . S/P TURP 08/15/2016  . Wears glasses   . Wears hearing aid in both ears    Past Surgical History:  Procedure Laterality Date  . BRONCHOSCOPY  10/2014  . CARDIAC CATHETERIZATION     05/07/12  . CARDIOVERSION     x2  . COLONOSCOPY    . DG BIOPSY LUNG Left 10/2014   FNA - Adenocarcinoma   . LUNG CANCER SURGERY Left 12/2014   Wedge Resection   . MULTIPLE TOOTH EXTRACTIONS    . Status post TURP    . TONSILLECTOMY     Social History   Socioeconomic History  . Marital status: Married    Spouse name: None  . Number of children: None  . Years of education: None  . Highest education level: None  Social Needs  . Financial resource strain: None  . Food insecurity - worry: None  . Food insecurity - inability: None  . Transportation needs - medical: None  . Transportation needs - non-medical: None  Occupational History  . None  Tobacco Use  . Smoking status: Former Smoker    Packs/day: 1.00    Years: 50.00    Pack years: 50.00    Types: Cigarettes    Last attempt to quit: 08/15/2012    Years since quitting: 4.5  . Smokeless tobacco: Never  Used  Substance and Sexual Activity  . Alcohol use: No  . Drug use: No  . Sexual activity: None  Other Topics Concern  . None  Social History Narrative   [facility] (09/26/16):   Originally from [place]. Moved to [facility] February 2018. Always lived in [place]. Moved to be closer to children & grandchildren. No international travel. 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 No mold exposure. No bird exposure or hot tub exposure. Enjoys reading. Previously enjoyed wood working with domestic woods.    Family History  Problem Relation Age of Onset  . Breast cancer Mother   . Breast cancer Maternal Aunt   . Breast cancer Maternal Aunt   . Lung disease Neg Hx    Allergies  Allergen Reactions  . Flecainide Hypertension    CAUSED HEART ISSUES    Prior to Admission medications   Medication Sig Start Date End Date Taking? Authorizing Provider  amoxicillin (AMOXIL) 500 MG tablet Take 500 mg 2 (two) times daily by mouth. Started 02/11/17 for 10 days   Yes [provider]  apixaban (ELIQUIS) 5 MG TABS tablet Take 5 mg by mouth 2 (two) times daily.   Yes [provider]  atenolol (TENORMIN) 50  MG tablet Take 50 mg by mouth daily.  11/28/16  Yes [provider]  atorvastatin (LIPITOR) 40 MG tablet Take 40 mg by mouth daily.   Yes [provider]  citalopram (CELEXA) 40 MG tablet Take 40 mg by mouth daily.   Yes [provider]  HYDROcodone-acetaminophen (NORCO) 10-325 MG tablet Take 1 tablet every 6 (six) hours as needed by mouth (chronic pain).   Yes [provider]  ibuprofen (ADVIL,MOTRIN) 400 MG tablet Take 400 mg by mouth every 4 (four) hours as needed for moderate pain.   Yes [provider]  levothyroxine (SYNTHROID, LEVOTHROID) 200 MCG tablet Take 200 mcg by mouth daily before breakfast.   Yes [provider]  levothyroxine (SYNTHROID, LEVOTHROID) 25  MCG tablet Take 25 mcg by mouth daily before breakfast.   Yes [provider]  LORazepam (ATIVAN) 0.5 MG tablet Take 0.5 mg daily as needed by mouth.  05/15/16  Yes [provider]  Oxycodone HCl 10 MG TABS Take 10-20 mg every 4 (four) hours as needed by mouth (severe pain).   Yes [provider]  tamsulosin (FLOMAX) 0.4 MG CAPS capsule Take 0.4 mg daily by mouth.    Yes [provider]     Positive ROS: All other systems have been reviewed and were otherwise negative with the exception of those mentioned in the HPI and as above.  Physical Exam: General: Alert, no acute distress Cardiovascular: No pedal edema Respiratory: No cyanosis, no use of accessory musculature GI: No organomegaly, abdomen is soft and non-tender Skin: No lesions in the area of chief complaint Neurologic: Sensation intact distally Psychiatric: Patient is competent for consent with normal mood and affect Lymphatic: No axillary or cervical lymphadenopathy  MUSCULOSKELETAL: Left leg has sensation intact throughout, EHL and FHL are intact, positive pain around the left thigh.  Assessment: MALIGNANT NEOPLASM IN LEFT LEG   Plan: Plan for Procedure(s): INTRAMEDULLARY (IM) NAIL FEMORAL  The risks benefits and alternatives were discussed with the patient including but not limited to the risks of nonoperative treatment, versus surgical intervention including infection, bleeding, nerve injury,  blood clots, cardiopulmonary complications, morbidity, mortality, among others, and they were willing to proceed.   Johnny Bridge, MD Cell [phone]   02/19/2017 9:59 AM

## 2017-02-19 NOTE — Discharge Instructions (Signed)

## 2017-02-19 NOTE — Anesthesia Preprocedure Evaluation (Addendum)
Anesthesia Evaluation  Patient identified by MRN, date of birth, ID band Patient awake    Reviewed: Allergy & Precautions, NPO status , Patient's Chart, lab work & pertinent test results  Airway Mallampati: II  TM Distance: >3 FB Neck ROM: Full    Dental no notable dental hx.    Pulmonary neg pulmonary ROS, COPD, former smoker,    Pulmonary exam normal breath sounds clear to auscultation       Cardiovascular hypertension, Pt. on medications and Pt. on home beta blockers negative cardio ROS Normal cardiovascular exam+ dysrhythmias Atrial Fibrillation  Rhythm:Regular Rate:Normal     Neuro/Psych Anxiety negative neurological ROS  negative psych ROS   GI/Hepatic negative GI ROS, Neg liver ROS,   Endo/Other  negative endocrine ROSHypothyroidism   Renal/GU negative Renal ROS  negative genitourinary   Musculoskeletal negative musculoskeletal ROS (+) Arthritis , Osteoarthritis,    Abdominal   Peds negative pediatric ROS (+)  Hematology negative hematology ROS (+)   Anesthesia Other Findings Adenocarcinoma of left lung, stage 3 ([facility]) COPD (chronic obstructive pulmonary disease) (H[facility]) S/P TURP Hypothyroid Longstanding persistent atrial fibrillation ([facility]) Centrilobular emphysema (HCC) Hyperlipidemia Anxiety disorder Bone metastasis ([facility])    Reproductive/Obstetrics negative OB ROS                            Anesthesia Physical Anesthesia Plan  ASA: III  Anesthesia Plan: Spinal   Post-op Pain Management:    Induction:   PONV Risk Score and Plan: 1 and Treatment may vary due to age or medical condition and Ondansetron  Airway Management Planned: Nasal Cannula and Natural Airway  Additional Equipment:   Intra-op Plan:   Post-operative Plan:   Informed Consent:   Plan Discussed with:   Anesthesia Plan Comments: (  )        Anesthesia Quick Evaluation

## 2017-02-19 NOTE — Progress Notes (Signed)
Patient arrived to [phone] with IV intact and infusing and on 3L of O2 via [place].  Noted to have foam dressings present on L hip/buttock area.  States no pain present on arrival. Patient oriented to room and equipment.  Family at bedside.  Will continue to monitor.

## 2017-02-20 ENCOUNTER — Encounter (HOSPITAL_COMMUNITY): Payer: Self-pay

## 2017-02-20 ENCOUNTER — Encounter (HOSPITAL_COMMUNITY): Payer: Self-pay | Admitting: Orthopedic Surgery

## 2017-02-20 ENCOUNTER — Ambulatory Visit: Payer: Medicare Other | Admitting: Internal Medicine

## 2017-02-20 LAB — CBC
HCT: 37.1 % — ABNORMAL LOW (ref 39.0–52.0)
HEMOGLOBIN: 12.6 g/dL — AB (ref 13.0–17.0)
MCH: 32.8 pg (ref 26.0–34.0)
MCHC: 34 g/dL (ref 30.0–36.0)
MCV: 96.6 fL (ref 78.0–100.0)
PLATELETS: 161 10*3/uL (ref 150–400)
RBC: 3.84 MIL/uL — AB (ref 4.22–5.81)
RDW: 13.4 % (ref 11.5–15.5)
WBC: 6.5 10*3/uL (ref 4.0–10.5)

## 2017-02-20 LAB — BASIC METABOLIC PANEL
ANION GAP: 7 (ref 5–15)
BUN: 14 mg/dL (ref 6–20)
CHLORIDE: 105 mmol/L (ref 101–111)
CO2: 25 mmol/L (ref 22–32)
Calcium: 8.4 mg/dL — ABNORMAL LOW (ref 8.9–10.3)
Creatinine, Ser: 0.83 mg/dL (ref 0.61–1.24)
GFR calc Af Amer: >60 mL/min (ref >60)
Glucose, Bld: 129 mg/dL — ABNORMAL HIGH (ref 65–99)
POTASSIUM: 3.9 mmol/L (ref 3.5–5.1)
SODIUM: 137 mmol/L (ref 135–145)

## 2017-02-20 NOTE — Evaluation (Signed)
Occupational Therapy Evaluation Patient Details Name: Victor Huber MRN: [id number] DOB: 05/25/1950 Today's Date: 02/20/2017    History of Present Illness This 66 y.o. male admitted for IM nailing of Lt femur due to malignant neoplasm of Lt leg.  PMH includes:  Adenocarcinoma of Lt lung, OA, A-Fib, COPD   Clinical Impression   Patient evaluated by Occupational Therapy with no further acute OT needs identified. All education has been completed and the patient has no further questions. Pt is able to perform ADLs with supervision to min A.  He is somewhat impulsive, and likely will push himself at discharge.  Reviewed and reinforced safe techniques with ADLs and functional transfers.  See below for any follow-up Occupational Therapy or equipment needs. OT is signing off. Thank you for this referral.        Follow Up Recommendations  No OT follow up;Supervision/Assistance - 24 hour    Equipment Recommendations  None recommended by OT    Recommendations for Other Services       Precautions / Restrictions Precautions Precautions: Fall Restrictions Weight Bearing Restrictions: Yes LLE Weight Bearing: Weight bearing as tolerated      Mobility Bed Mobility Overal bed mobility: Modified Independent                Transfers Overall transfer level: Needs assistance Equipment used: Rolling walker (2 wheeled) Transfers: Sit to/from [facility] Sit to Stand: Supervision Stand pivot transfers: Supervision       General transfer comment: supervision for safety     Balance Overall balance assessment: Needs assistance Sitting-balance support: No upper extremity supported Sitting balance-Leahy Scale: Normal     Standing balance support: During functional activity;No upper extremity supported Standing balance-Leahy Scale: Fair Standing balance comment: static standing with supervision.  requires UE support for dynamic standing                             ADL either performed or assessed with clinical judgement   ADL Overall ADL's : Needs assistance/impaired Eating/Feeding: Independent   Grooming: Wash/dry hands;Wash/dry face;Oral care;Brushing hair;Supervision/safety;Standing   Upper Body Bathing: Supervision/ safety;Set up;Standing;Sitting   Lower Body Bathing: Supervison/ safety;Sit to/from stand   Upper Body Dressing : Set up;Standing   Lower Body Dressing: Minimal assistance;Sit to/from stand Lower Body Dressing Details (indicate cue type and reason): difficulty donning sock over left toes  Toilet Transfer: Supervision/safety;Ambulation;Comfort height toilet;RW   Toileting- Clothing Manipulation and Hygiene: Supervision/safety;Sit to/from stand   Tub/ Shower Transfer: Walk-in shower;Supervision/safety;Ambulation;Rolling walker Tub/Shower Transfer Details (indicate cue type and reason): Simulated placing foot on seat to wash feet.  Recommended he take walker into shower to provide support, or that he sit to bathe feet (optimal choice).   Functional mobility during ADLs: Supervision/safety;Rolling walker General ADL Comments: Pt is very motivated and somewhat impulsive.  Discussed need to pace self and to move cautiously to avoid fall.      Vision         Perception     Praxis      Pertinent Vitals/Pain Pain Assessment: 0-10 Pain Score: 5  Pain Location: Lt femur  Pain Descriptors / Indicators: Aching;Operative site guarding;Sharp Pain Intervention(s): Monitored during session;Repositioned;Patient requesting pain meds-RN notified     Hand Dominance Right   Extremity/Trunk Assessment Upper Extremity Assessment Upper Extremity Assessment: Overall WFL for tasks assessed   Lower Extremity Assessment Lower Extremity Assessment: Defer to PT evaluation   Cervical / Trunk Assessment  Cervical / Trunk Assessment: Normal   Communication Communication Communication: No difficulties   Cognition  Arousal/Alertness: Awake/alert Behavior During Therapy: WFL for tasks assessed/performed Overall Cognitive Status: Within Functional Limits for tasks assessed                                     General Comments  Reinforced safe techniques and not to push himself too fast or hard.      Exercises     Shoulder Instructions      Home Living Family/patient expects to be discharged to:: Private residence Living Arrangements: Spouse/significant other Available Help at Discharge: Family;Available 24 hours/day Type of Home: House Home Access: Stairs to enter [employer] of Steps: 1         Bathroom Shower/Tub: Occupational psychologist: Handicapped height Bathroom Accessibility: Yes How Accessible: Accessible via walker Home Equipment: [place] - 2 wheels;Cane - single point;Shower seat - built in;Hand held shower head          Prior Functioning/Environment Level of Independence: Independent with assistive device(s)        Comments: Pt intermittently used SPC or RW PTA due to pain.  He does not work due to disability         OT Problem List: Pain      OT Treatment/Interventions:      OT Goals(Current goals can be found in the care plan section) Acute Rehab OT Goals Patient Stated Goal: to go home  OT Goal Formulation: All assessment and education complete, DC therapy  OT Frequency:     Barriers to D/C:            Co-evaluation PT/OT/SLP Co-Evaluation/Treatment: Yes Reason for Co-Treatment: For patient/therapist safety(seen with PT anticipating he would need +2 assist )   OT goals addressed during session: ADL's and self-care      AM-PAC PT "6 Clicks" Daily Activity     Outcome Measure Help from another person eating meals?: None Help from another person taking care of personal grooming?: A Little Help from another person toileting, which includes using toliet, bedpan, or urinal?: A Little Help from another person bathing  (including washing, rinsing, drying)?: A Little Help from another person to put on and taking off regular upper body clothing?: A Little Help from another person to put on and taking off regular lower body clothing?: A Little 6 Click Score: 19   End of Session Equipment Utilized During Treatment: Rolling walker;Gait belt Nurse Communication: Mobility status;Patient requests pain meds  Activity Tolerance: Patient tolerated treatment well Patient left: in bed;with call bell/phone within reach;with family/visitor present  OT Visit Diagnosis: Pain Pain - Right/Left: Left Pain - part of body: Leg                Time: 7564-3329 OT Time Calculation (min): 40 min Charges:  OT General Charges $OT Visit: 1 Visit OT Evaluation $OT Eval Low Complexity: 1 Low G-Codes:     [facility], OTR/L [phone]   Lucille Passy M 02/20/2017, 12:17 PM

## 2017-02-20 NOTE — Discharge Summary (Signed)
Physician Discharge Summary  Patient ID: Victor Huber MRN: [id number] DOB/AGE: [birth year] 66 y.o.  Admit date: 02/19/2017 Discharge date: 02/20/2017  Admission Diagnoses:  Bone metastasis [facility])  Discharge Diagnoses:  Principal Problem:   Bone metastasis ([place]), left femur Active Problems:   Metastasis to bone [facility])   Past Medical History:  Diagnosis Date  . Adenocarcinoma of left lung, stage 3 ([place]) 08/15/2016  . Arthritis   . Atrial fibrillation ([place]) 08/15/2016  . Atrial fibrillation ([place])   . COPD (chronic obstructive pulmonary disease) ([place]) 08/15/2016  . History of chemotherapy   . History of radiation therapy   . Hyperlipidemia   . Hypothyroid 08/15/2016  . Longstanding persistent atrial fibrillation ([place]) 08/29/2016  . Pathologic fracture    left femur  . Pneumonitis   . S/P TURP 08/15/2016  . Wears glasses   . Wears hearing aid in both ears     Surgeries: Procedure(s): INTRAMEDULLARY (IM) NAIL FEMORAL on 02/19/2017   Consultants (if any):   Discharged Condition: Improved  Hospital Course: Victor Huber is an 66 y.o. male who was admitted 02/19/2017 with a diagnosis of Bone metastasis ([place]) and went to the operating room on 02/19/2017 and underwent the above named procedures.    He was given perioperative antibiotics:  Anti-infectives (From admission, onward)   Start     Dose/Rate Route Frequency Ordered Stop   02/20/17 1000  amoxicillin (AMOXIL) capsule 500 mg     500 mg Oral 2 times daily 02/19/17 1501 02/21/17 0959   02/19/17 2200  amoxicillin (AMOXIL) capsule 500 mg  Status:  Discontinued     500 mg Oral 2 times daily 02/19/17 1402 02/19/17 1501   02/19/17 1800  ceFAZolin (ANCEF) IVPB 2g/100 mL premix     2 g 200 mL/hr over 30 Minutes Intravenous Every 6 hours 02/19/17 1402 02/20/17 0559   02/19/17 0900  ceFAZolin (ANCEF) IVPB 2g/100 mL premix  Status:  Discontinued     2 g 200 mL/hr over 30 Minutes Intravenous To [facility] 02/18/17 0952  02/19/17 1356    .  He was given sequential compression devices, early ambulation, and eliquis for DVT prophylaxis.  He benefited maximally from the hospital stay and there were no complications.    Recent vital signs:  Vitals:   02/20/17 0146 02/20/17 0515  BP: 102/77 113/76  Pulse: 85 (!) 102  Resp: 18 18  Temp: 98.6 F (37 C) 98.6 F (37 C)  SpO2: 98% 97%    Recent laboratory studies:  Lab Results  Component Value Date   HGB 12.6 (L) 02/20/2017   HGB 15.5 02/05/2017   HGB 14.8 01/28/2017   Lab Results  Component Value Date   WBC 6.5 02/20/2017   PLT 161 02/20/2017   Lab Results  Component Value Date   INR 1.07 02/18/2017   Lab Results  Component Value Date   NA 137 02/20/2017   K 3.9 02/20/2017   CL 105 02/20/2017   CO2 25 02/20/2017   BUN 14 02/20/2017   CREATININE 0.83 02/20/2017   GLUCOSE 129 (H) 02/20/2017    Discharge Medications:   Allergies as of 02/20/2017      Reactions   Flecainide Hypertension   CAUSED HEART ISSUES       Medication List    STOP taking these medications   HYDROcodone-acetaminophen 10-325 MG tablet Commonly known as:  NORCO     TAKE these medications   amoxicillin 500 MG tablet Commonly known as:  AMOXIL Take  500 mg 2 (two) times daily by mouth. Started 02/11/17 for 10 days   atenolol 50 MG tablet Commonly known as:  TENORMIN Take 50 mg by mouth daily.   atorvastatin 40 MG tablet Commonly known as:  LIPITOR Take 40 mg by mouth daily.   baclofen 10 MG tablet Commonly known as:  LIORESAL Take 1 tablet (10 mg total) by mouth 3 (three) times daily. As needed for muscle spasm   citalopram 40 MG tablet Commonly known as:  CELEXA Take 40 mg by mouth daily.   ELIQUIS 5 MG Tabs tablet Generic drug:  apixaban Take 5 mg by mouth 2 (two) times daily.   ibuprofen 400 MG tablet Commonly known as:  ADVIL,MOTRIN Take 400 mg by mouth every 4 (four) hours as needed for moderate pain.   levothyroxine 200 MCG  tablet Commonly known as:  SYNTHROID, LEVOTHROID Take 200 mcg by mouth daily before breakfast.   levothyroxine 25 MCG tablet Commonly known as:  SYNTHROID, LEVOTHROID Take 25 mcg by mouth daily before breakfast.   LORazepam 0.5 MG tablet Commonly known as:  ATIVAN Take 0.5 mg daily as needed by mouth.   ondansetron 4 MG tablet Commonly known as:  ZOFRAN Take 1 tablet (4 mg total) by mouth every 8 (eight) hours as needed for nausea or vomiting.   oxyCODONE 5 MG immediate release tablet Commonly known as:  ROXICODONE Take 1-2 tablets (5-10 mg total) by mouth every 4 (four) hours as needed for severe pain. What changed:    medication strength  how much to take  reasons to take this   sennosides-docusate sodium 8.6-50 MG tablet Commonly known as:  SENOKOT-S Take 2 tablets by mouth daily.   tamsulosin 0.4 MG Caps capsule Commonly known as:  FLOMAX Take 0.4 mg daily by mouth.       Diagnostic Studies: Mr Victor Huber DT Contrast  Result Date: 01/29/2017 CLINICAL DATA:  Followup lung cancer staging. Creatinine was obtained on site at [facility] at [address] Results: Creatinine 0.9 mg/dL. EXAM: MRI HEAD WITHOUT AND WITH CONTRAST TECHNIQUE: Multiplanar, multiecho pulse sequences of the brain and surrounding structures were obtained without and with intravenous contrast. CONTRAST:  60mL MULTIHANCE GADOBENATE DIMEGLUMINE 529 MG/ML IV SOLN COMPARISON:  11/18/2014 FINDINGS: Brain: Mild age related volume loss. No evidence of old or acute small or large vessel infarction. No primary or metastatic mass lesion, hemorrhage, hydrocephalus or extra-axial collection. Vascular: Major vessels at the base of the brain show flow. Skull and upper cervical spine: Negative Sinuses/Orbits: Clear except for minimal mucosal thickening along the maxillary sinus floors. Orbits negative. Other: None IMPRESSION: Normal examination for age.  No evidence of metastatic disease. Electronically  Signed   By: Victor Huber M.D.   On: 01/29/2017 11:02   Dg C-arm 1-60 Min  Result Date: 02/19/2017 CLINICAL DATA:  Intramedullary nail placement left femur EXAM: DG C-ARM 61-120 MIN; LEFT FEMUR 2 VIEWS COMPARISON:  PET-CT January 12, 2017 FLUOROSCOPY TIME:  1 minutes 14 seconds; 5 acquired images FINDINGS: Frontal and lateral views show screw and nail fixation the left femur. The rod extends through an expansile lesion in the distal femoral diaphysis. There is periosteal elevation in these areas, felt to be of neoplastic etiology. No acute fracture or dislocation. Tip of the screw proximally is in the proximal left femoral head. IMPRESSION: Fixation through neoplastic lesion distal femoral diaphysis. Alignment anatomic. No fracture or dislocation evident. Electronically Signed   By: Lowella Grip III M.D.  On: 02/19/2017 12:07   [place] Core Biopsy (lymph Nodes)  Result Date: 01/28/2017 INDICATION: History of lung cancer, now with hypermetabolic left supraclavicular lymph node. Please perform ultrasound-guided lymph node biopsy for tissue diagnostic purposes. EXAM: ULTRASOUND-GUIDED BIOPSY OF HYPERMETABOLIC LEFT SUPRACLAVICULAR LYMPH NODE COMPARISON:  PET-CT - 01/12/2017 MEDICATIONS: None ANESTHESIA/SEDATION: Moderate (conscious) sedation was employed during this procedure. A total of Versed 4 mg and Fentanyl 200 mcg was administered intravenously. Moderate Sedation Time: 23 minutes. The patient's level of consciousness and vital signs were monitored continuously by radiology nursing throughout the procedure under my direct supervision. COMPLICATIONS: None immediate. TECHNIQUE: Informed written consent was obtained from the patient after a discussion of the risks, benefits and alternatives to treatment. Questions regarding the procedure were encouraged and answered. Initial ultrasound scanning demonstrated an approximately 1.7 x 1.5 cm lymph node within the left supraclavicular fossa (image 6) correlating  with the hypermetabolic lymph node seen on preceding PET-CT. An ultrasound image was saved for documentation purposes. The procedure was planned. A timeout was performed prior to the initiation of the procedure. The operative was prepped and draped in the usual sterile fashion, and a sterile drape was applied covering the operative field. A timeout was performed prior to the initiation of the procedure. Local anesthesia was provided with 1% lidocaine with epinephrine. Under direct ultrasound guidance, an 18 gauge core needle device was utilized to obtain to obtain 5 core needle biopsies of the left subclavicular lymph node. The samples were placed in saline and submitted to pathology. The needle was removed and hemostasis was achieved with manual compression. Post procedure scan was negative for significant hematoma. A dressing was placed. The patient tolerated the procedure well without immediate postprocedural complication. IMPRESSION: Technically successful ultrasound guided biopsy of left supraclavicular lymph node. Electronically Signed   By: Sandi Mariscal M.D.   On: 01/28/2017 14:43   Dg Femur Min 2 Views Left  Result Date: 02/19/2017 CLINICAL DATA:  Intramedullary nail placement left femur EXAM: DG C-ARM 61-120 MIN; LEFT FEMUR 2 VIEWS COMPARISON:  PET-CT January 12, 2017 FLUOROSCOPY TIME:  1 minutes 14 seconds; 5 acquired images FINDINGS: Frontal and lateral views show screw and nail fixation the left femur. The rod extends through an expansile lesion in the distal femoral diaphysis. There is periosteal elevation in these areas, felt to be of neoplastic etiology. No acute fracture or dislocation. Tip of the screw proximally is in the proximal left femoral head. IMPRESSION: Fixation through neoplastic lesion distal femoral diaphysis. Alignment anatomic. No fracture or dislocation evident. Electronically Signed   By: Lowella Grip III M.D.   On: 02/19/2017 12:07   Dg Femur Port Min 2 Views  Left  Result Date: 02/19/2017 CLINICAL DATA:  Intramedullary nailing EXAM: LEFT FEMUR PORTABLE 2 VIEWS COMPARISON:  Intraoperative images February 19, 2017; PET-CT January 12, 2017 FINDINGS: Frontal and lateral views were obtained. There is nail fixation through a lytic lesion in the femoral diaphysis. There is extensive irregular periosteal reaction in this area consistent with neoplasia. The lucency extends over a distance of approximately 8 cm. Alignment is anatomic in the postoperative area. No fracture or dislocation. Screw tip is in the proximal femoral head. IMPRESSION: Male transfixes neoplastic appearing lytic lesion in the femur at the mid the distal third level with extensive irregular periosteal reaction consistent with neoplastic involvement. Alignment anatomic. No acute fracture or dislocation. Electronically Signed   By: Lowella Grip III M.D.   On: 02/19/2017 13:40    Disposition: Final discharge disposition  not confirmed    Follow-up Information    Marchia Bond, MD. Schedule an appointment as soon as possible for a visit in 2 weeks.   Specialty:  Orthopedic Surgery Contact information: [address] [place] [address] [phone]            Signed: Johnny Bridge 02/20/2017, 9:09 AM

## 2017-02-20 NOTE — Evaluation (Signed)
Physical Therapy Evaluation Patient Details Name: Victor Huber MRN: [id number] DOB: [birth year] Today's Date: 02/20/2017   History of Present Illness  This 66 y.o. male admitted for IM nailing of Lt femur due to malignant neoplasm of Lt leg.  PMH includes:  Adenocarcinoma of Lt lung, OA, A-Fib, COPD  Clinical Impression  Pt was able to walk with assistance and noted his control of LLE was hindered by rushing with gait, talked with him about controlling pace and using walker to offload more effectively.  Pt is able to do HEP with minor correction and his wife was able to see the routine to assist with quality of the work.  Follow acutely if he remains here tomorrow and otherwise has plan to follow up with therapy services at home.     Follow Up Recommendations Home health PT    Equipment Recommendations  None recommended by PT    Recommendations for Other Services       Precautions / Restrictions Precautions Precautions: Fall Precaution Comments: instructed him in weight distribution on the LLE Restrictions Weight Bearing Restrictions: Yes LLE Weight Bearing: Weight bearing as tolerated      Mobility  Bed Mobility Overal bed mobility: Modified Independent                Transfers Overall transfer level: Needs assistance Equipment used: Rolling walker (2 wheeled) Transfers: Sit to/from Stand Sit to Stand: Supervision;Min guard Stand pivot transfers: Supervision;Min guard       General transfer comment: supervision for safety   Ambulation/Gait Ambulation/Gait assistance: Min assist;Min guard Ambulation Distance (Feet): 250 Feet Assistive device: Rolling walker (2 wheeled);1 person hand held assist;Pushed wheelchair Gait Pattern/deviations: Step-to pattern;Step-through pattern;Decreased stance time - left;Decreased stride length;Decreased dorsiflexion - left;Antalgic;Wide base of support Gait velocity: reduced Gait velocity interpretation: Below normal speed  for age/gender    Stairs Stairs: Yes Stairs assistance: Min guard;+2 physical assistance;+2 safety/equipment Stair Management: One rail Right Number of Stairs: 3 General stair comments: close guard of pt with verbal cues for sequence  Wheelchair Mobility    Modified Rankin (Stroke Patients Only)       Balance Overall balance assessment: Needs assistance Sitting-balance support: Feet supported Sitting balance-Leahy Scale: Good   Postural control: Posterior lean Standing balance support: Bilateral upper extremity supported;During functional activity Standing balance-Leahy Scale: Fair Standing balance comment: RW for support of UE's                             Pertinent Vitals/Pain Pain Assessment: 0-10 Pain Score: 5  Pain Location: L thigh Pain Descriptors / Indicators: Operative site guarding Pain Intervention(s): Limited activity within patient's tolerance;Monitored during session;Premedicated before session;Repositioned    Home Living Family/patient expects to be discharged to:: Private residence Living Arrangements: Spouse/significant other Available Help at Discharge: Family;Available 24 hours/day Type of Home: House Home Access: Stairs to enter Entrance Stairs-Rails: Right Entrance Stairs-Number of Steps: 1 Home Layout: One level Home Equipment: Walker - 2 wheels;Cane - single point;Shower seat - built in;Hand held shower head      Prior Function Level of Independence: Independent with assistive device(s)         Comments: Pt intermittently used SPC or RW PTA due to pain.  He does not work due to disability      Hand Dominance   Dominant Hand: Right    Extremity/Trunk Assessment   Upper Extremity Assessment Upper Extremity Assessment: Overall WFL for tasks assessed  Lower Extremity Assessment Lower Extremity Assessment: Overall WFL for tasks assessed;LLE deficits/detail LLE Deficits / Details: 4- to 4+ strength    Cervical /  Trunk Assessment Cervical / Trunk Assessment: Normal  Communication   Communication: No difficulties  Cognition Arousal/Alertness: Awake/alert Behavior During Therapy: WFL for tasks assessed/performed Overall Cognitive Status: Within Functional Limits for tasks assessed                                        General Comments General comments (skin integrity, edema, etc.): reminded pt to control pace of walker use to offload the LLE and decrease pain    Exercises General Exercises - Lower Extremity Ankle Circles/Pumps: AROM;Both;5 reps Quad Sets: AROM;Both;10 reps Gluteal Sets: AROM;Both;10 reps Heel Slides: AROM;Both;10 reps Hip ABduction/ADduction: AROM;Both;10 reps Straight Leg Raises: AROM;AAROM;Both;10 reps   Assessment/Plan    PT Assessment Patient needs continued PT services  PT Problem List Decreased strength;Decreased range of motion;Decreased activity tolerance;Decreased balance;Decreased mobility;Decreased coordination;Decreased knowledge of use of DME;Decreased safety awareness;Obesity;Decreased skin integrity;Pain       PT Treatment Interventions DME instruction;Gait training;Stair training;Functional mobility training;Therapeutic activities;Therapeutic exercise;Balance training;Neuromuscular re-education;Patient/family education    PT Goals (Current goals can be found in the Care Plan section)  Acute Rehab PT Goals Patient Stated Goal: to go home  PT Goal Formulation: With patient/family Time For Goal Achievement: 03/06/17 Potential to Achieve Goals: Good    Frequency Min 4X/week   Barriers to discharge Inaccessible home environment has stairs to enter house    Co-evaluation PT/OT/SLP Co-Evaluation/Treatment: Yes Reason for Co-Treatment: Complexity of the patient's impairments (multi-system involvement);For patient/therapist safety PT goals addressed during session: Mobility/safety with mobility;Balance         AM-PAC PT "6 Clicks"  Daily Activity  Outcome Measure Difficulty turning over in bed (including adjusting bedclothes, sheets and blankets)?: A Little Difficulty moving from lying on back to sitting on the side of the bed? : A Little Difficulty sitting down on and standing up from a chair with arms (e.g., wheelchair, bedside commode, etc,.)?: A Little Help needed moving to and from a bed to chair (including a wheelchair)?: A Little Help needed walking in hospital room?: A Little Help needed climbing 3-5 steps with a railing? : A Little 6 Click Score: 18    End of Session Equipment Utilized During Treatment: Gait belt Activity Tolerance: Patient tolerated treatment well;Patient limited by fatigue Patient left: in bed;with call bell/phone within reach;with family/visitor present Nurse Communication: Mobility status PT Visit Diagnosis: Unsteadiness on feet (R26.81);Muscle weakness (generalized) (M62.81);Difficulty in walking, not elsewhere classified (R26.2);Pain Pain - Right/Left: Left Pain - part of body: Hip    Time: 6294-7654 PT Time Calculation (min) (ACUTE ONLY): 36 min   Charges:   PT Evaluation $PT Eval Moderate Complexity: 1 Mod PT Treatments $Gait Training: 8-22 mins   PT G Codes:   PT G-Codes **NOT FOR INPATIENT CLASS** Functional Assessment Tool Used: AM-PAC 6 Clicks Basic Mobility    Ramond Dial 02/20/2017, 5:35 PM   Mee Hives, PT MS Acute Rehab Dept. Number: [place] [facility] [place]

## 2017-02-24 NOTE — Progress Notes (Signed)
  Radiation Oncology         [phone] ________________________________  Name: Victor Huber MRN: [id number]  Date: 01/22/2017  DOB: 1951/02/03  SIMULATION AND TREATMENT PLANNING NOTE  DIAGNOSIS:     ICD-10-CM   1. Metastasis to bone [facility]) C79.51      Site: Left femur  NARRATIVE:  The patient was brought to the [facility].  Identity was confirmed.  All relevant records and images related to the planned course of therapy were reviewed.   Written consent to proceed with treatment was confirmed which was freely given after reviewing the details related to the planned course of therapy had been reviewed with the patient.  Then, the patient was set-up in a stable reproducible  supine position for radiation therapy.  CT images were obtained.  Surface markings were placed.    Medically necessary complex treatment device(s) for immobilization: Customized Vac-Lok bag.   The CT images were loaded into the planning software.  Then the target and avoidance structures were contoured.  Treatment planning then occurred.  The radiation prescription was entered and confirmed.  A total of 2 complex treatment devices were fabricated which relate to the designed radiation treatment fields. Each of these customized fields/ complex treatment devices will be used on a daily basis during the radiation course. I have requested : Isodose Plan.   PLAN:  The patient will receive 28 Gy in 8 fractions.  ________________________________   Jodelle Gross, MD, PhD

## 2017-02-26 ENCOUNTER — Encounter: Payer: Self-pay | Admitting: *Deleted

## 2017-02-26 ENCOUNTER — Telehealth: Payer: Self-pay | Admitting: Internal Medicine

## 2017-02-26 ENCOUNTER — Ambulatory Visit (HOSPITAL_BASED_OUTPATIENT_CLINIC_OR_DEPARTMENT_OTHER): Payer: Medicare Other | Admitting: Internal Medicine

## 2017-02-26 ENCOUNTER — Encounter: Payer: Self-pay | Admitting: Internal Medicine

## 2017-02-26 VITALS — BP 91/60 | HR 117 | Temp 98.6°F | Resp 20 | Ht 73.0 in | Wt 257.9 lb

## 2017-02-26 DIAGNOSIS — C3492 Malignant neoplasm of unspecified part of left bronchus or lung: Secondary | ICD-10-CM

## 2017-02-26 DIAGNOSIS — C770 Secondary and unspecified malignant neoplasm of lymph nodes of head, face and neck: Secondary | ICD-10-CM | POA: Diagnosis not present

## 2017-02-26 DIAGNOSIS — G893 Neoplasm related pain (acute) (chronic): Secondary | ICD-10-CM

## 2017-02-26 DIAGNOSIS — C3432 Malignant neoplasm of lower lobe, left bronchus or lung: Secondary | ICD-10-CM

## 2017-02-26 DIAGNOSIS — Z7189 Other specified counseling: Secondary | ICD-10-CM

## 2017-02-26 DIAGNOSIS — C7951 Secondary malignant neoplasm of bone: Secondary | ICD-10-CM | POA: Diagnosis not present

## 2017-02-26 DIAGNOSIS — R5382 Chronic fatigue, unspecified: Secondary | ICD-10-CM

## 2017-02-26 DIAGNOSIS — R530 Neoplastic (malignant) related fatigue: Secondary | ICD-10-CM | POA: Diagnosis not present

## 2017-02-26 DIAGNOSIS — Z5112 Encounter for antineoplastic immunotherapy: Secondary | ICD-10-CM

## 2017-02-26 DIAGNOSIS — Z5111 Encounter for antineoplastic chemotherapy: Secondary | ICD-10-CM

## 2017-02-26 MED ORDER — CYANOCOBALAMIN 1000 MCG/ML IJ SOLN
INTRAMUSCULAR | Status: AC
  Filled 2017-02-26: qty 1

## 2017-02-26 MED ORDER — PROCHLORPERAZINE MALEATE 10 MG PO TABS: 10.0000 mg | ORAL_TABLET | Freq: Four times a day (QID) | ORAL | 0 refills | Status: DC | PRN

## 2017-02-26 MED ORDER — DEXAMETHASONE 4 MG PO TABS: ORAL_TABLET | ORAL | 1 refills | Status: DC

## 2017-02-26 MED ORDER — CYANOCOBALAMIN 1000 MCG/ML IJ SOLN
1000.0000 ug | Freq: Once | INTRAMUSCULAR | Status: AC
  Administered 2017-02-26: 1000 ug via INTRAMUSCULAR

## 2017-02-26 MED ORDER — FOLIC ACID 1 MG PO TABS: 1.0000 mg | ORAL_TABLET | Freq: Every day | ORAL | 4 refills | Status: DC

## 2017-02-26 NOTE — Progress Notes (Signed)
[facility] [phone]   [phone]  OFFICE PROGRESS NOTE  [name], MD [address], [place] [address]  DIAGNOSIS: Metastatic non-small cell lung cancer initially diagnosed as stage IIIA (T2a, N2, M0) non-small cell lung cancer, poorly differentiated adenocarcinoma presented with left lower lobe lung mass in addition to mediastinal lymphadenopathy.  The patient was diagnosed with metastatic disease involving the left femur as well as left supraclavicular nodal metastases and right paratracheal lymphadenopathy in October 2018.  Biomarker Findings Microsatellite Status - MS-Stable Tumor Mutational Burden - TMB-Low (3 Muts/Mb) Genomic Findings For a complete list of the genes assayed, please refer to the Appendix. STK11 P248f*6 [id number] site 1956+3O>VDAXX E374* MLL2 [id number] NBN K23390f NOTCH2 [id number] splice site [id number]>CDisease relevant genes with no reportable alterations: EGFR, KRAS, ALK, BRAF, MET, RET, ERBB2, ROS1   PDL1 expression 5%  PRIOR THERAPY: 1) status post wedge resection of the left lower lobe lung mass as well as AP window lymph node dissection but there was residual metastatic mediastinal lymphadenopathy that could not be resected. 2) a course of concurrent chemoradiation [place] carboplatin [place]  3) status post palliative radiotherapy to the left femur metastatic bone disease.   CURRENT THERAPY: Systemic chemotherapy with carboplatin for AC of 5, Alimta 500 mg/M2 and Keytruda 200 mg IV every 3 weeks.  First dose March 07, 2017.  INTERVAL HISTORY: Victor Huber 66o. male returns to the clinic today for follow-up visit accompanied by his wife and son.  The patient is feeling much better today except for fatigue.  His pain is much better controlled with the current pain medication.  He underwent intramedullary nail fixation of the  left femur on February 19, 2017.  The patient denied having any current chest pain, shortness of breath, cough or hemoptysis.  He denied having any recent weight loss or night sweats.  He has no nausea, vomiting, diarrhea or constipation.  He had molecular studies performed by [facility] and unfortunately it did not show any targetable mutations.  PDL 1 expression was 5%.  He is here today for evaluation and discussion of his treatment options.  MEDICAL HISTORY: Past Medical History:  Diagnosis Date  . Adenocarcinoma of left lung, stage 3 ([facility]/16/[place]  . Arthritis   . Atrial fibrillation ([facility]/16/2018  . Atrial fibrillation ([facility] . COPD (chronic obstructive pulmonary disease) ([facility]/16/2018  . History of chemotherapy   . History of radiation therapy   . Hyperlipidemia   . Hypothyroid 08/15/2016  . Longstanding persistent atrial fibrillation ([facility]/30/2018  . Pathologic fracture    left femur  . Pneumonitis   . S/P TURP 08/15/2016  . Wears glasses   . Wears hearing aid in both ears     ALLERGIES:  is allergic to flecainide.  MEDICATIONS:  Current Outpatient Medications  Medication Sig Dispense Refill  . amoxicillin (AMOXIL) 500 MG tablet Take 500 mg 2 (two) times daily by mouth. Started 02/11/17 for 10 days    . apixaban (ELIQUIS) 5 MG TABS tablet Take 5 mg by mouth 2 (two) times daily.    . aMarland Kitchenenolol (TENORMIN) 50 MG tablet Take 50 mg by mouth daily.     . aMarland Kitchenorvastatin (LIPITOR) 40 MG tablet Take 40 mg by mouth daily.    . baclofen (LIORESAL) 10 MG tablet Take 1 tablet (10 mg total) by mouth 3 (three) times daily.  As needed for muscle spasm 50 tablet 0  . citalopram (CELEXA) 40 MG tablet Take 40 mg by mouth daily.    Marland Kitchen ibuprofen (ADVIL,MOTRIN) 400 MG tablet Take 400 mg by mouth every 4 (four) hours as needed for moderate pain.    Marland Kitchen levothyroxine (SYNTHROID, LEVOTHROID) 200 MCG tablet Take 200 mcg by mouth daily before breakfast.    . levothyroxine (SYNTHROID, LEVOTHROID)  25 MCG tablet Take 25 mcg by mouth daily before breakfast.    . LORazepam (ATIVAN) 0.5 MG tablet Take 0.5 mg daily as needed by mouth.     . ondansetron (ZOFRAN) 4 MG tablet Take 1 tablet (4 mg total) by mouth every 8 (eight) hours as needed for nausea or vomiting. 30 tablet 0  . oxyCODONE (ROXICODONE) 5 MG immediate release tablet Take 1-2 tablets (5-10 mg total) by mouth every 4 (four) hours as needed for severe pain. 50 tablet 0  . sennosides-docusate sodium (SENOKOT-S) 8.6-50 MG tablet Take 2 tablets by mouth daily. 30 tablet 1  . tamsulosin (FLOMAX) 0.4 MG CAPS capsule Take 0.4 mg daily by mouth.      No current facility-administered medications for this visit.    Facility-Administered Medications Ordered in Other Visits  Medication Dose Route Frequency Provider Last Rate Last Dose  . bupivacaine 0.75% in dextrose 8.25% (intrathecal) (SENSORCAINE) 0.75-8.25 % injection   Intrathecal Anesthesia Stephannie Li, MD   15 mg at 02/19/17 1035  . ceFAZolin (ANCEF) IVPB 2 g/50 mL premix   Intravenous Anesthesia Intra-op Eligha Bridegroom, CRNA   2 g at 02/19/17 1025  . lactated ringers infusion    Continuous PRN Eligha Bridegroom, CRNA      . lidocaine (cardiac) 100 mg/84m (XYLOCAINE) 20 MG/ML injection 2%    Anesthesia Intra-op AEligha Bridegroom CRNA   30 mg at 02/19/17 1036  . ondansetron (ZOFRAN) injection   Intravenous Anesthesia Intra-op AEligha Bridegroom CRNA   4 mg at 02/19/17 1139  . phenylephrine (NEO-SYNEPHRINE) 0.04 mg/mL in dextrose 5 % 250 mL infusion    Continuous PRN AEligha Bridegroom CRNA   Stopped at 02/19/17 1203  . propofol (DIPRIVAN) 10 mg/mL bolus/IV push    Anesthesia Intra-op AEligha Bridegroom CRNA   30 mg at 02/19/17 1036  . propofol (DIPRIVAN) 500 MG/50ML infusion    Continuous PRN AEligha Bridegroom CRNA   Stopped at 02/19/17 1203    SURGICAL HISTORY:  Past Surgical History:  Procedure Laterality Date  . BRONCHOSCOPY  10/2014  . CARDIAC CATHETERIZATION     05/07/12  .  CARDIOVERSION     x2  . COLONOSCOPY    . DG BIOPSY LUNG Left 10/2014   FNA - Adenocarcinoma   . FEMUR IM NAIL Left 02/19/2017  . FEMUR IM NAIL Left 02/19/2017   Procedure: INTRAMEDULLARY (IM) NAIL FEMORAL;  Surgeon: LMarchia Bond MD;  [facility]  Service: Orthopedics;  Laterality: Left;  . LUNG CANCER SURGERY Left 12/2014   Wedge Resection   . MULTIPLE TOOTH EXTRACTIONS    . Status post TURP    . TONSILLECTOMY      REVIEW OF SYSTEMS:  Constitutional: positive for fatigue Eyes: negative Ears, nose, mouth, throat, and face: negative Respiratory: negative Cardiovascular: negative Gastrointestinal: negative Genitourinary:negative Integument/breast: negative Hematologic/lymphatic: negative Musculoskeletal:positive for back pain and bone pain Neurological: negative Behavioral/Psych: negative Endocrine: negative Allergic/Immunologic: negative   PHYSICAL EXAMINATION: General appearance: alert, cooperative, fatigued and no distress Head: Normocephalic, without obvious abnormality, atraumatic Neck: no adenopathy, no JVD, supple, symmetrical, trachea midline and  thyroid not enlarged, symmetric, no tenderness/mass/nodules Lymph nodes: Cervical, supraclavicular, and axillary nodes normal. Resp: clear to auscultation bilaterally Back: symmetric, no curvature. ROM normal. No CVA tenderness. Cardio: regular rate and rhythm, S1, S2 normal, no murmur, click, rub or gallop GI: soft, non-tender; bowel sounds normal; no masses,  no organomegaly Extremities: extremities normal, atraumatic, no cyanosis or edema Neurologic: Alert and oriented X 3, normal strength and tone. Normal symmetric reflexes. Normal coordination and gait  ECOG PERFORMANCE STATUS: 1 - Symptomatic but completely ambulatory  Blood pressure 91/60, pulse (!) 117, temperature 98.6 F (37 C), temperature source Oral, resp. rate 20, height 6' 1"  (1.854 m), weight 257 lb 14.4 oz (117 kg), SpO2 95 %.  LABORATORY DATA: Lab  Results  Component Value Date   WBC 6.5 02/20/2017   HGB 12.6 (L) 02/20/2017   HCT 37.1 (L) 02/20/2017   MCV 96.6 02/20/2017   PLT 161 02/20/2017      Chemistry      Component Value Date/Time   NA 137 02/20/2017 0722   NA 141 02/05/2017 0904   K 3.9 02/20/2017 0722   K 5.4 (H) 02/05/2017 0904   CL 105 02/20/2017 0722   CO2 25 02/20/2017 0722   CO2 27 02/05/2017 0904   BUN 14 02/20/2017 0722   BUN 20.8 02/05/2017 0904   CREATININE 0.83 02/20/2017 0722   CREATININE 0.9 02/05/2017 0904      Component Value Date/Time   CALCIUM 8.4 (L) 02/20/2017 0722   CALCIUM 9.7 02/05/2017 0904   ALKPHOS 102 02/05/2017 0904   AST 19 02/05/2017 0904   ALT 24 02/05/2017 0904   BILITOT 0.80 02/05/2017 0904       RADIOGRAPHIC STUDIES: Mr Jeri Cos ME Contrast  Result Date: 01/29/2017 CLINICAL DATA:  Followup lung cancer staging. Creatinine was obtained on site at [facility] at [address] Results: Creatinine 0.9 mg/dL. EXAM: MRI HEAD WITHOUT AND WITH CONTRAST TECHNIQUE: Multiplanar, multiecho pulse sequences of the brain and surrounding structures were obtained without and with intravenous contrast. CONTRAST:  28m MULTIHANCE GADOBENATE DIMEGLUMINE 529 MG/ML IV SOLN COMPARISON:  11/18/2014 FINDINGS: Brain: Mild age related volume loss. No evidence of old or acute small or large vessel infarction. No primary or metastatic mass lesion, hemorrhage, hydrocephalus or extra-axial collection. Vascular: Major vessels at the base of the brain show flow. Skull and upper cervical spine: Negative Sinuses/Orbits: Clear except for minimal mucosal thickening along the maxillary sinus floors. Orbits negative. Other: None IMPRESSION: Normal examination for age.  No evidence of metastatic disease. Electronically Signed   By: MNelson ChimesM.D.   On: 01/29/2017 11:02   Dg C-arm 1-60 Min  Result Date: 02/19/2017 CLINICAL DATA:  Intramedullary nail placement left femur EXAM: DG C-ARM 61-120 MIN; LEFT  FEMUR 2 VIEWS COMPARISON:  PET-CT January 12, 2017 FLUOROSCOPY TIME:  1 minutes 14 seconds; 5 acquired images FINDINGS: Frontal and lateral views show screw and nail fixation the left femur. The rod extends through an expansile lesion in the distal femoral diaphysis. There is periosteal elevation in these areas, felt to be of neoplastic etiology. No acute fracture or dislocation. Tip of the screw proximally is in the proximal left femoral head. IMPRESSION: Fixation through neoplastic lesion distal femoral diaphysis. Alignment anatomic. No fracture or dislocation evident. Electronically Signed   By: WLowella GripIII M.D.   On: 02/19/2017 12:07   UK[place]Core Biopsy (lymph Nodes)  Result Date: 01/28/2017 INDICATION: History of lung cancer, now with hypermetabolic left supraclavicular  lymph node. Please perform ultrasound-guided lymph node biopsy for tissue diagnostic purposes. EXAM: ULTRASOUND-GUIDED BIOPSY OF HYPERMETABOLIC LEFT SUPRACLAVICULAR LYMPH NODE COMPARISON:  PET-CT - 01/12/2017 MEDICATIONS: None ANESTHESIA/SEDATION: Moderate (conscious) sedation was employed during this procedure. A total of Versed 4 mg and Fentanyl 200 mcg was administered intravenously. Moderate Sedation Time: 23 minutes. The patient's level of consciousness and vital signs were monitored continuously by radiology nursing throughout the procedure under my direct supervision. COMPLICATIONS: None immediate. TECHNIQUE: Informed written consent was obtained from the patient after a discussion of the risks, benefits and alternatives to treatment. Questions regarding the procedure were encouraged and answered. Initial ultrasound scanning demonstrated an approximately 1.7 x 1.5 cm lymph node within the left supraclavicular fossa (image 6) correlating with the hypermetabolic lymph node seen on preceding PET-CT. An ultrasound image was saved for documentation purposes. The procedure was planned. A timeout was performed prior to the initiation  of the procedure. The operative was prepped and draped in the usual sterile fashion, and a sterile drape was applied covering the operative field. A timeout was performed prior to the initiation of the procedure. Local anesthesia was provided with 1% lidocaine with epinephrine. Under direct ultrasound guidance, an 18 gauge core needle device was utilized to obtain to obtain 5 core needle biopsies of the left subclavicular lymph node. The samples were placed in saline and submitted to pathology. The needle was removed and hemostasis was achieved with manual compression. Post procedure scan was negative for significant hematoma. A dressing was placed. The patient tolerated the procedure well without immediate postprocedural complication. IMPRESSION: Technically successful ultrasound guided biopsy of left supraclavicular lymph node. Electronically Signed   By: Sandi Mariscal M.D.   On: 01/28/2017 14:43   Dg Femur Min 2 Views Left  Result Date: 02/19/2017 CLINICAL DATA:  Intramedullary nail placement left femur EXAM: DG C-ARM 61-120 MIN; LEFT FEMUR 2 VIEWS COMPARISON:  PET-CT January 12, 2017 FLUOROSCOPY TIME:  1 minutes 14 seconds; 5 acquired images FINDINGS: Frontal and lateral views show screw and nail fixation the left femur. The rod extends through an expansile lesion in the distal femoral diaphysis. There is periosteal elevation in these areas, felt to be of neoplastic etiology. No acute fracture or dislocation. Tip of the screw proximally is in the proximal left femoral head. IMPRESSION: Fixation through neoplastic lesion distal femoral diaphysis. Alignment anatomic. No fracture or dislocation evident. Electronically Signed   By: Lowella Grip III M.D.   On: 02/19/2017 12:07   Dg Femur Port Min 2 Views Left  Result Date: 02/19/2017 CLINICAL DATA:  Intramedullary nailing EXAM: LEFT FEMUR PORTABLE 2 VIEWS COMPARISON:  Intraoperative images February 19, 2017; PET-CT January 12, 2017 FINDINGS: Frontal and  lateral views were obtained. There is nail fixation through a lytic lesion in the femoral diaphysis. There is extensive irregular periosteal reaction in this area consistent with neoplasia. The lucency extends over a distance of approximately 8 cm. Alignment is anatomic in the postoperative area. No fracture or dislocation. Screw tip is in the proximal femoral head. IMPRESSION: Male transfixes neoplastic appearing lytic lesion in the femur at the mid the distal third level with extensive irregular periosteal reaction consistent with neoplastic involvement. Alignment anatomic. No acute fracture or dislocation. Electronically Signed   By: Lowella Grip III M.D.   On: 02/19/2017 13:40    ASSESSMENT AND PLAN: This is a 66 years old white male with metastatic non-small cell lung cancer, adenocarcinoma with no actionable mutations and PDL 1 expression of 5%  that was initially diagnosed as stage IIIa non-small cell lung cancer, adenocarcinoma status post left lower lobectomy with lymph node dissection followed by a course of concurrent chemoradiation completed in January 2016.  The patient had evidence for disease metastasis in October 2018 with metastatic disease to the left femur as well as left supraclavicular and right paratracheal lymph nodes. I had a lengthy discussion with the patient and his family today about his current disease of stage, prognosis and treatment options. I explained to the patient and his family that he has an incurable condition and all the treatment will be of palliative nature. I gave the patient the option of palliative care versus consideration of palliative systemic chemotherapy with carboplatin for AC of 5, Alimta 500 mg/M2 and Keytruda 200 mg IV every 3 weeks. The patient and his family are interested in proceeding with systemic chemotherapy. I discussed with the patient the adverse effect of this treatment including but not limited to alopecia, myelosuppression, nausea and  vomiting, peripheral neuropathy, liver or renal dysfunction as well as the adverse effect of Keytruda including immunotherapy mediated to skin rash, diarrhea, inflammation of the lung, kidney, liver, thyroid or other endocrine dysfunction. He is expected to start the first cycle of this treatment on March 07, 2017. I will arrange for the patient to have a chemotherapy education class before the first dose of his treatment. He will receive vitamin B12 injection today. I will call his pharmacy with prescription for Compazine 10 mg p.o. every 6 hours as needed for nausea, folic acid 1 mg p.o. daily in addition to Decadron 4 mg p.o. twice daily the day before, day of and day after chemotherapy every 3 weeks. The patient will come back for follow-up visit in 4 weeks with the start of cycle #2. For pain management he would continue with the current pain medication for now. He was advised to call immediately if he has any concerning symptoms in the interval. The patient voices understanding of current disease status and treatment options and is in agreement with the current care plan.  All questions were answered. The patient knows to call the clinic with any problems, questions or concerns. We can certainly see the patient much sooner if necessary. I spent 30 minutes counseling the patient face to face. The total time spent in the appointment was 40 minutes.  Disclaimer: This note was dictated with voice recognition software. Similar sounding words can inadvertently be transcribed and may not be corrected upon review.

## 2017-02-26 NOTE — Telephone Encounter (Signed)
Scheduled appt per 11/27 los - per patient request want to be scheduled on Thursday instead of wednesdays. Gave patient AVS and calender per los.

## 2017-02-26 NOTE — Progress Notes (Signed)
Oncology Nurse Navigator Documentation  Oncology Nurse Navigator Flowsheets 02/26/2017  Navigator Location [facility]  Navigator Encounter Type Clinic/MDC/I spoke with patient and family today.  I helped explain treatment plan and medications. Patient also had concerns about co-pay and how much that will be. I will contact [facility] and have them contact patient with an update.   Patient Visit Type MedOnc  Treatment Phase Pre-Tx/Tx Discussion  Barriers/Navigation Needs Education;Coordination of Care  Education Newly Diagnosed Cancer Education;Other  Interventions Education;Coordination of Care  Coordination of Care Other  Education Method Verbal;Written  Acuity Level 2  Acuity Level 2 Educational needs;Other  Time Spent with Patient 30

## 2017-02-26 NOTE — Progress Notes (Signed)
START ON PATHWAY REGIMEN - Non-Small Cell Lung     A cycle is every 21 days:     Pembrolizumab      Pemetrexed      Carboplatin   **Always confirm dose/schedule in your pharmacy ordering system**    Patient Characteristics: Stage IV Metastatic, Nonsquamous, Initial Chemotherapy/Immunotherapy, PS = 0, 1, PD-L1 Expression Positive 1-49% (TPS) / Negative / Not Tested / Awaiting Test Results AJCC T Category: T2a Current Disease Status: Distant Metastases AJCC N Category: N2 AJCC M Category: M1c AJCC 8 Stage Grouping: IVB Histology: Nonsquamous Cell ROS1 Rearrangement Status: Negative T790M Mutation Status: Not Applicable - EGFR Mutation Negative/Unknown Other Mutations/Biomarkers: No Other Actionable Mutations PD-L1 Expression Status: PD-L1 Positive 1-49% (TPS) Chemotherapy/Immunotherapy LOT: Initial Chemotherapy/Immunotherapy Molecular Targeted Therapy: Not Appropriate ALK Translocation Status: Negative Would you be surprised if this patient died  in the next year<= I would NOT be surprised if this patient died in the next year EGFR Mutation Status: Negative/Wild Type BRAF V600E Mutation Status: Negative Performance Status: PS = 0, 1 Intent of Therapy: Non-Curative / Palliative Intent, Discussed with Patient

## 2017-02-27 ENCOUNTER — Encounter (HOSPITAL_COMMUNITY): Payer: Self-pay | Admitting: Orthopedic Surgery

## 2017-02-27 ENCOUNTER — Encounter: Payer: Self-pay | Admitting: *Deleted

## 2017-02-27 ENCOUNTER — Encounter: Payer: Self-pay | Admitting: Internal Medicine

## 2017-02-27 NOTE — Anesthesia Postprocedure Evaluation (Signed)
Anesthesia Post Note  Patient: Victor Huber  Procedure(s) Performed: INTRAMEDULLARY (IM) NAIL FEMORAL (Left Leg Upper)     Patient location during evaluation: PACU Anesthesia Type: Spinal Level of consciousness: oriented and awake and alert Pain management: pain level controlled Vital Signs Assessment: post-procedure vital signs reviewed and stable Respiratory status: spontaneous breathing, respiratory function stable and patient connected to nasal cannula oxygen Cardiovascular status: blood pressure returned to baseline and stable Postop Assessment: no headache, no backache and no apparent nausea or vomiting Anesthetic complications: no    Last Vitals:  Vitals:   02/20/17 1000 02/20/17 1304  BP: 108/79 113/64  Pulse: 91 88  Resp: 16 18  Temp: 37.1 C 37 C  SpO2: 92% 99%    Last Pain:  Vitals:   02/20/17 1304  TempSrc: Oral  PainSc:                  [name]

## 2017-02-27 NOTE — Progress Notes (Signed)
Received email from [place] regarding patient with questions. Called patient and left voicemail with my contact name and number and also offered to meet with him on 11/30 after chemo ed class.

## 2017-02-28 ENCOUNTER — Encounter: Payer: Self-pay | Admitting: General Practice

## 2017-02-28 ENCOUNTER — Other Ambulatory Visit: Payer: Self-pay | Admitting: Medical Oncology

## 2017-02-28 DIAGNOSIS — C3492 Malignant neoplasm of unspecified part of left bronchus or lung: Secondary | ICD-10-CM

## 2017-02-28 MED ORDER — PROCHLORPERAZINE MALEATE 10 MG PO TABS: 10.0000 mg | ORAL_TABLET | Freq: Four times a day (QID) | ORAL | 0 refills | Status: DC | PRN

## 2017-02-28 NOTE — Progress Notes (Unsigned)
[facility] Psychosocial Distress Screening Clinical Social Work  Clinical Social Work was referred by distress screening protocol.  The patient scored a 7 on the Psychosocial Distress Thermometer which indicates moderate distress. Clinical Social Worker Edwyna Shell to assess for distress and other psychosocial needs. Unable to reach patient, VM left on home machine.    ONCBCN DISTRESS SCREENING 01/18/2017  Screening Type Initial Screening  Distress experienced in past week (1-10) 7  Family Problem type Partner  Emotional problem type Adjusting to illness;Boredom  Physical Problem type Pain  Physician notified of physical symptoms Yes  Referral to clinical psychology No  Referral to clinical social work Yes    Clinical Social Worker follow up needed: Yes.    If yes, follow up plan:  Attempt recontact.    Edwyna Shell, LCSW Clinical Social Worker Phone:  [phone]

## 2017-02-28 NOTE — Addendum Note (Signed)
Addended by: Ardeen Garland on: 02/28/2017 01:12 PM   Modules accepted: Orders

## 2017-03-01 ENCOUNTER — Encounter: Payer: Self-pay | Admitting: Internal Medicine

## 2017-03-01 ENCOUNTER — Other Ambulatory Visit: Payer: Medicare Other

## 2017-03-01 NOTE — Progress Notes (Signed)
Pt came in to discuss how much he will be billed for [place].  I informed him I have no way of knowing before hand how much he will be responsible for but he has Medicare which is an 43, 20 plan so after Medicare disallows an amount they will pay 80% which will leave him 20% to pay.  I also informed him unfortunately there aren't any foundations offering copay assistance for his Dx and that drug.  He verbalized understanding.  I gave him Shauna's card in case he has any questions in the future.

## 2017-03-06 NOTE — Addendum Note (Signed)
Addendum  created 03/06/17 1037 by Josephine Igo, CRNA   Intraprocedure Staff edited

## 2017-03-07 ENCOUNTER — Ambulatory Visit (HOSPITAL_BASED_OUTPATIENT_CLINIC_OR_DEPARTMENT_OTHER): Payer: Medicare Other

## 2017-03-07 ENCOUNTER — Other Ambulatory Visit (HOSPITAL_BASED_OUTPATIENT_CLINIC_OR_DEPARTMENT_OTHER): Payer: Medicare Other

## 2017-03-07 ENCOUNTER — Ambulatory Visit
Admission: RE | Admit: 2017-03-07 | Discharge: 2017-03-07 | Disposition: A | Discharge status: Home / Self Care | Payer: Medicare Other | Source: Ambulatory Visit | Attending: Radiation Oncology | Admitting: Radiation Oncology

## 2017-03-07 VITALS — BP 122/76 | HR 92 | Temp 98.2°F | Resp 20 | Ht 73.0 in | Wt 257.8 lb

## 2017-03-07 DIAGNOSIS — C770 Secondary and unspecified malignant neoplasm of lymph nodes of head, face and neck: Secondary | ICD-10-CM | POA: Diagnosis not present

## 2017-03-07 DIAGNOSIS — R5382 Chronic fatigue, unspecified: Secondary | ICD-10-CM

## 2017-03-07 DIAGNOSIS — C3492 Malignant neoplasm of unspecified part of left bronchus or lung: Secondary | ICD-10-CM

## 2017-03-07 DIAGNOSIS — C7951 Secondary malignant neoplasm of bone: Secondary | ICD-10-CM | POA: Diagnosis not present

## 2017-03-07 DIAGNOSIS — Z79899 Other long term (current) drug therapy: Secondary | ICD-10-CM | POA: Insufficient documentation

## 2017-03-07 DIAGNOSIS — C3432 Malignant neoplasm of lower lobe, left bronchus or lung: Secondary | ICD-10-CM | POA: Diagnosis present

## 2017-03-07 DIAGNOSIS — Z5111 Encounter for antineoplastic chemotherapy: Secondary | ICD-10-CM | POA: Diagnosis not present

## 2017-03-07 DIAGNOSIS — Z5112 Encounter for antineoplastic immunotherapy: Secondary | ICD-10-CM | POA: Diagnosis present

## 2017-03-07 LAB — COMPREHENSIVE METABOLIC PANEL
ALT: 16 U/L (ref 0–55)
ANION GAP: 12 meq/L — AB (ref 3–11)
AST: 15 U/L (ref 5–34)
Albumin: 4.1 g/dL (ref 3.5–5.0)
Alkaline Phosphatase: 118 U/L (ref 40–150)
BUN: 23.6 mg/dL (ref 7.0–26.0)
CHLORIDE: 107 meq/L (ref 98–109)
CO2: 20 meq/L — AB (ref 22–29)
Calcium: 9.9 mg/dL (ref 8.4–10.4)
Creatinine: 0.9 mg/dL (ref 0.7–1.3)
Glucose: 167 mg/dl — ABNORMAL HIGH (ref 70–140)
Potassium: 4.4 mEq/L (ref 3.5–5.1)
Sodium: 139 mEq/L (ref 136–145)
Total Bilirubin: 0.63 mg/dL (ref 0.20–1.20)
Total Protein: 7.3 g/dL (ref 6.4–8.3)

## 2017-03-07 LAB — CBC WITH DIFFERENTIAL/PLATELET
BASO%: 0.4 % (ref 0.0–2.0)
BASOS ABS: 0.1 10*3/uL (ref 0.0–0.1)
EOS ABS: 0 10*3/uL (ref 0.0–0.5)
EOS%: 0 % (ref 0.0–7.0)
HEMATOCRIT: 45 % (ref 38.4–49.9)
HEMOGLOBIN: 15.1 g/dL (ref 13.0–17.1)
LYMPH#: 0.9 10*3/uL (ref 0.9–3.3)
LYMPH%: 6.1 % — ABNORMAL LOW (ref 14.0–49.0)
MCH: 32.3 pg (ref 27.2–33.4)
MCHC: 33.6 g/dL (ref 32.0–36.0)
MCV: 96.2 fL (ref 79.3–98.0)
MONO#: 0.3 10*3/uL (ref 0.1–0.9)
MONO%: 2.3 % (ref 0.0–14.0)
NEUT#: 13.6 10*3/uL — ABNORMAL HIGH (ref 1.5–6.5)
NEUT%: 91.2 % — ABNORMAL HIGH (ref 39.0–75.0)
PLATELETS: 235 10*3/uL (ref 140–400)
RBC: 4.68 10*6/uL (ref 4.20–5.82)
RDW: 13.7 % (ref 11.0–14.6)
WBC: 14.9 10*3/uL — ABNORMAL HIGH (ref 4.0–10.3)

## 2017-03-07 LAB — RESEARCH LABS

## 2017-03-07 LAB — TSH: TSH: 0.416 m(IU)/L (ref 0.320–4.118)

## 2017-03-07 MED ORDER — PALONOSETRON HCL INJECTION 0.25 MG/5ML
INTRAVENOUS | Status: AC
  Filled 2017-03-07: qty 5

## 2017-03-07 MED ORDER — PALONOSETRON HCL INJECTION 0.25 MG/5ML
0.2500 mg | Freq: Once | INTRAVENOUS | Status: AC
  Administered 2017-03-07: 0.25 mg via INTRAVENOUS

## 2017-03-07 MED ORDER — SODIUM CHLORIDE 0.9 % IV SOLN
490.0000 mg/m2 | Freq: Once | INTRAVENOUS | Status: AC
  Administered 2017-03-07: 1200 mg via INTRAVENOUS
  Filled 2017-03-07: qty 40

## 2017-03-07 MED ORDER — SODIUM CHLORIDE 0.9 % IV SOLN
Freq: Once | INTRAVENOUS | Status: AC
  Administered 2017-03-07: 13:00:00 via INTRAVENOUS

## 2017-03-07 MED ORDER — SODIUM CHLORIDE 0.9 % IV SOLN
200.0000 mg | Freq: Once | INTRAVENOUS | Status: AC
  Administered 2017-03-07: 200 mg via INTRAVENOUS
  Filled 2017-03-07: qty 8

## 2017-03-07 MED ORDER — SODIUM CHLORIDE 0.9 % IV SOLN
710.0000 mg | Freq: Once | INTRAVENOUS | Status: AC
  Administered 2017-03-07: 710 mg via INTRAVENOUS
  Filled 2017-03-07: qty 71

## 2017-03-07 MED ORDER — SODIUM CHLORIDE 0.9 % IV SOLN
Freq: Once | INTRAVENOUS | Status: AC
  Administered 2017-03-07: 14:00:00 via INTRAVENOUS
  Filled 2017-03-07: qty 5

## 2017-03-07 NOTE — Patient Instructions (Signed)
[facility] Discharge Instructions for Patients Receiving Chemotherapy  Today you received the following chemotherapy agents:  Keytruda (pembrolizumab), Alimta (pemetrexed), Carboplatin (paraplatin)  To help prevent nausea and vomiting after your treatment, we encourage you to take your nausea medication as prescribed. If you develop nausea and vomiting that is not controlled by your nausea medication, call the clinic.   BELOW ARE SYMPTOMS THAT SHOULD BE REPORTED IMMEDIATELY:  *FEVER GREATER THAN 100.5 F  *CHILLS WITH OR WITHOUT FEVER  NAUSEA AND VOMITING THAT IS NOT CONTROLLED WITH YOUR NAUSEA MEDICATION  *UNUSUAL SHORTNESS OF BREATH  *UNUSUAL BRUISING OR BLEEDING  TENDERNESS IN MOUTH AND THROAT WITH OR WITHOUT PRESENCE OF ULCERS  *URINARY PROBLEMS  *BOWEL PROBLEMS  UNUSUAL RASH Items with * indicate a potential emergency and should be followed up as soon as possible.  Feel free to call the clinic should you have any questions or concerns. The clinic phone number is [phone].  Please show the [place] at check-in to the Emergency Department and triage nurse.    Carboplatin injection What is this medicine? CARBOPLATIN (KAR boe pla tin) is a chemotherapy drug. It targets fast dividing cells, like cancer cells, and causes these cells to die. This medicine is used to treat ovarian cancer and many other cancers. This medicine may be used for other purposes; ask your health care provider or pharmacist if you have questions. COMMON BRAND NAME(S): Paraplatin What should I tell my health care provider before I take this medicine? They need to know if you have any of these conditions: -blood disorders -hearing problems -kidney disease -recent or ongoing radiation therapy -an unusual or allergic reaction to carboplatin, cisplatin, other chemotherapy, other medicines, foods, dyes, or preservatives -pregnant or trying to get  pregnant -breast-feeding How should I use this medicine? This drug is usually given as an infusion into a vein. It is administered in a hospital or clinic by a specially trained health care professional. Talk to your pediatrician regarding the use of this medicine in children. Special care may be needed. Overdosage: If you think you have taken too much of this medicine contact a poison control center or emergency room at once. NOTE: This medicine is only for you. Do not share this medicine with others. What if I miss a dose? It is important not to miss a dose. Call your doctor or health care professional if you are unable to keep an appointment. What may interact with this medicine? -medicines for seizures -medicines to increase blood counts like filgrastim, pegfilgrastim, sargramostim -some antibiotics like amikacin, gentamicin, neomycin, streptomycin, tobramycin -vaccines Talk to your doctor or health care professional before taking any of these medicines: -acetaminophen -aspirin -ibuprofen -ketoprofen -naproxen This list may not describe all possible interactions. Give your health care provider a list of all the medicines, herbs, non-prescription drugs, or dietary supplements you use. Also tell them if you smoke, drink alcohol, or use illegal drugs. Some items may interact with your medicine. What should I watch for while using this medicine? Your condition will be monitored carefully while you are receiving this medicine. You will need important blood work done while you are taking this medicine. This drug may make you feel generally unwell. This is not uncommon, as chemotherapy can affect healthy cells as well as cancer cells. Report any side effects. Continue your course of treatment even though you feel ill unless your doctor tells you to stop. In some cases, you may be given additional medicines to  help with side effects. Follow all directions for their use. Call your doctor or  health care professional for advice if you get a fever, chills or sore throat, or other symptoms of a cold or flu. Do not treat yourself. This drug decreases your body's ability to fight infections. Try to avoid being around people who are sick. This medicine may increase your risk to bruise or bleed. Call your doctor or health care professional if you notice any unusual bleeding. Be careful brushing and flossing your teeth or using a toothpick because you may get an infection or bleed more easily. If you have any dental work done, tell your dentist you are receiving this medicine. Avoid taking products that contain aspirin, acetaminophen, ibuprofen, naproxen, or ketoprofen unless instructed by your doctor. These medicines may hide a fever. Do not become pregnant while taking this medicine. Women should inform their doctor if they wish to become pregnant or think they might be pregnant. There is a potential for serious side effects to an unborn child. Talk to your health care professional or pharmacist for more information. Do not breast-feed an infant while taking this medicine. What side effects may I notice from receiving this medicine? Side effects that you should report to your doctor or health care professional as soon as possible: -allergic reactions like skin rash, itching or hives, swelling of the face, lips, or tongue -signs of infection - fever or chills, cough, sore throat, pain or difficulty passing urine -signs of decreased platelets or bleeding - bruising, pinpoint red spots on the skin, black, tarry stools, nosebleeds -signs of decreased red blood cells - unusually weak or tired, fainting spells, lightheadedness -breathing problems -changes in hearing -changes in vision -chest pain -high blood pressure -low blood counts - This drug may decrease the number of white blood cells, red blood cells and platelets. You may be at increased risk for infections and bleeding. -nausea and  vomiting -pain, swelling, redness or irritation at the injection site -pain, tingling, numbness in the hands or feet -problems with balance, talking, walking -trouble passing urine or change in the amount of urine Side effects that usually do not require medical attention (report to your doctor or health care professional if they continue or are bothersome): -hair loss -loss of appetite -metallic taste in the mouth or changes in taste This list may not describe all possible side effects. Call your doctor for medical advice about side effects. You may report side effects to FDA at 1-800-FDA-1088. Where should I keep my medicine? This drug is given in a hospital or clinic and will not be stored at home. NOTE: This sheet is a summary. It may not cover all possible information. If you have questions about this medicine, talk to your doctor, pharmacist, or health care provider.  2018 Elsevier/Gold Standard (2007-06-24 14:38:05)   Pemetrexed injection What is this medicine? PEMETREXED (PEM e TREX ed) is a chemotherapy drug used to treat lung cancers like non-small cell lung cancer and mesothelioma. It may also be used to treat other cancers. This medicine may be used for other purposes; ask your health care provider or pharmacist if you have questions. COMMON BRAND NAME(S): Alimta What should I tell my health care provider before I take this medicine? They need to know if you have any of these conditions: -infection (especially a virus infection such as chickenpox, cold sores, or herpes) -kidney disease -low blood counts, like low white cell, platelet, or red cell counts -lung or breathing disease,  like asthma -radiation therapy -an unusual or allergic reaction to pemetrexed, other medicines, foods, dyes, or preservative -pregnant or trying to get pregnant -breast-feeding How should I use this medicine? This drug is given as an infusion into a vein. It is administered in a hospital or  clinic by a specially trained health care professional. Talk to your pediatrician regarding the use of this medicine in children. Special care may be needed. Overdosage: If you think you have taken too much of this medicine contact a poison control center or emergency room at once. NOTE: This medicine is only for you. Do not share this medicine with others. What if I miss a dose? It is important not to miss your dose. Call your doctor or health care professional if you are unable to keep an appointment. What may interact with this medicine? This medicine may interact with the following medications: -Ibuprofen This list may not describe all possible interactions. Give your health care provider a list of all the medicines, herbs, non-prescription drugs, or dietary supplements you use. Also tell them if you smoke, drink alcohol, or use illegal drugs. Some items may interact with your medicine. What should I watch for while using this medicine? Visit your doctor for checks on your progress. This drug may make you feel generally unwell. This is not uncommon, as chemotherapy can affect healthy cells as well as cancer cells. Report any side effects. Continue your course of treatment even though you feel ill unless your doctor tells you to stop. In some cases, you may be given additional medicines to help with side effects. Follow all directions for their use. Call your doctor or health care professional for advice if you get a fever, chills or sore throat, or other symptoms of a cold or flu. Do not treat yourself. This drug decreases your body's ability to fight infections. Try to avoid being around people who are sick. This medicine may increase your risk to bruise or bleed. Call your doctor or health care professional if you notice any unusual bleeding. Be careful brushing and flossing your teeth or using a toothpick because you may get an infection or bleed more easily. If you have any dental work done,  tell your dentist you are receiving this medicine. Avoid taking products that contain aspirin, acetaminophen, ibuprofen, naproxen, or ketoprofen unless instructed by your doctor. These medicines may hide a fever. Call your doctor or health care professional if you get diarrhea or mouth sores. Do not treat yourself. To protect your kidneys, drink water or other fluids as directed while you are taking this medicine. Do not become pregnant while taking this medicine or for 6 months after stopping it. Women should inform their doctor if they wish to become pregnant or think they might be pregnant. Men should not father a child while taking this medicine and for 3 months after stopping it. This may interfere with the ability to father a child. You should talk to your doctor or health care professional if you are concerned about your fertility. There is a potential for serious side effects to an unborn child. Talk to your health care professional or pharmacist for more information. Do not breast-feed an infant while taking this medicine or for 1 week after stopping it. What side effects may I notice from receiving this medicine? Side effects that you should report to your doctor or health care professional as soon as possible: -allergic reactions like skin rash, itching or hives, swelling of the face, lips,  or tongue -breathing problems -redness, blistering, peeling or loosening of the skin, including inside the mouth -signs and symptoms of bleeding such as bloody or black, tarry stools; red or dark-brown urine; spitting up blood or brown material that looks like coffee grounds; red spots on the skin; unusual bruising or bleeding from the eye, gums, or nose -signs and symptoms of infection like fever or chills; cough; sore throat; pain or trouble passing urine -signs and symptoms of kidney injury like trouble passing urine or change in the amount of urine -signs and symptoms of liver injury like dark yellow  or brown urine; general ill feeling or flu-like symptoms; light-colored stools; loss of appetite; nausea; right upper belly pain; unusually weak or tired; yellowing of the eyes or skin Side effects that usually do not require medical attention (report to your doctor or health care professional if they continue or are bothersome): -constipation -dizziness -mouth sores -nausea, vomiting -pain, tingling, numbness in the hands or feet -unusually weak or tired This list may not describe all possible side effects. Call your doctor for medical advice about side effects. You may report side effects to FDA at 1-800-FDA-1088. Where should I keep my medicine? This drug is given in a hospital or clinic and will not be stored at home. NOTE: This sheet is a summary. It may not cover all possible information. If you have questions about this medicine, talk to your doctor, pharmacist, or health care provider.  2018 Elsevier/Gold Standard (2016-01-17 18:51:46)   Pembrolizumab injection What is this medicine? PEMBROLIZUMAB (pem broe liz ue mab) is a monoclonal antibody. It is used to treat melanoma, head and neck cancer, Hodgkin lymphoma, non-small cell lung cancer, urothelial cancer, stomach cancer, and cancers that have a certain genetic condition. This medicine may be used for other purposes; ask your health care provider or pharmacist if you have questions. COMMON BRAND NAME(S): Keytruda What should I tell my health care provider before I take this medicine? They need to know if you have any of these conditions: -diabetes -immune system problems -inflammatory bowel disease -liver disease -lung or breathing disease -lupus -organ transplant -an unusual or allergic reaction to pembrolizumab, other medicines, foods, dyes, or preservatives -pregnant or trying to get pregnant -breast-feeding How should I use this medicine? This medicine is for infusion into a vein. It is given by a health care  professional in a hospital or clinic setting. A special MedGuide will be given to you before each treatment. Be sure to read this information carefully each time. Talk to your pediatrician regarding the use of this medicine in children. While this drug may be prescribed for selected conditions, precautions do apply. Overdosage: If you think you have taken too much of this medicine contact a poison control center or emergency room at once. NOTE: This medicine is only for you. Do not share this medicine with others. What if I miss a dose? It is important not to miss your dose. Call your doctor or health care professional if you are unable to keep an appointment. What may interact with this medicine? Interactions have not been studied. Give your health care provider a list of all the medicines, herbs, non-prescription drugs, or dietary supplements you use. Also tell them if you smoke, drink alcohol, or use illegal drugs. Some items may interact with your medicine. This list may not describe all possible interactions. Give your health care provider a list of all the medicines, herbs, non-prescription drugs, or dietary supplements you use. Also  tell them if you smoke, drink alcohol, or use illegal drugs. Some items may interact with your medicine. What should I watch for while using this medicine? Your condition will be monitored carefully while you are receiving this medicine. You may need blood work done while you are taking this medicine. Do not become pregnant while taking this medicine or for 4 months after stopping it. Women should inform their doctor if they wish to become pregnant or think they might be pregnant. There is a potential for serious side effects to an unborn child. Talk to your health care professional or pharmacist for more information. Do not breast-feed an infant while taking this medicine or for 4 months after the last dose. What side effects may I notice from receiving this  medicine? Side effects that you should report to your doctor or health care professional as soon as possible: -allergic reactions like skin rash, itching or hives, swelling of the face, lips, or tongue -bloody or black, tarry -breathing problems -changes in vision -chest pain -chills -constipation -cough -dizziness or feeling faint or lightheaded -fast or irregular heartbeat -fever -flushing -hair loss -low blood counts - this medicine may decrease the number of white blood cells, red blood cells and platelets. You may be at increased risk for infections and bleeding. -muscle pain -muscle weakness -persistent headache -signs and symptoms of high blood sugar such as dizziness; dry mouth; dry skin; fruity breath; nausea; stomach pain; increased hunger or thirst; increased urination -signs and symptoms of kidney injury like trouble passing urine or change in the amount of urine -signs and symptoms of liver injury like dark urine, light-colored stools, loss of appetite, nausea, right upper belly pain, yellowing of the eyes or skin -stomach pain -sweating -weight loss Side effects that usually do not require medical attention (report to your doctor or health care professional if they continue or are bothersome): -decreased appetite -diarrhea -tiredness This list may not describe all possible side effects. Call your doctor for medical advice about side effects. You may report side effects to FDA at 1-800-FDA-1088. Where should I keep my medicine? This drug is given in a hospital or clinic and will not be stored at home. NOTE: This sheet is a summary. It may not cover all possible information. If you have questions about this medicine, talk to your doctor, pharmacist, or health care provider.  2018 Elsevier/Gold Standard (2015-12-27 12:29:36)

## 2017-03-07 NOTE — Progress Notes (Signed)
Radiation Oncology         [phone]) [phone] ________________________________  Name: Victor Huber MRN: [id number]  Date of Service: 03/07/2017 DOB: Jun 24, 1950  Post Treatment Note  CC: [name], MD  Curt Bears, MD  Diagnosis:   recurrent metastatic Stage IIIA, T2aN2M0, NSCLC, adenocarcinoma of the left lower lobe with probable metastatic disease to the left femur     Interval Since Last Radiation:  5 weeks   01/23/2017 - 02/01/2017: The left femur was treated to 28 Gy in 8 fractions of 3.5 Gy.  01/11/15-03/01/15:   left chest and regional nodes over 7 weeks with concurrent weekly taxol/carboplatin in [place].  Narrative:  The patient returns today for routine follow-up. He is feeling well and tolerated treatment.                           On review of systems, the patient states he is no longer having any pain in his left femur.  ALLERGIES:  is allergic to flecainide.  Meds: Current Outpatient Medications  Medication Sig Dispense Refill  . amoxicillin (AMOXIL) 500 MG tablet Take 500 mg 2 (two) times daily by mouth. Started 02/11/17 for 10 days    . apixaban (ELIQUIS) 5 MG TABS tablet Take 5 mg by mouth 2 (two) times daily.    Marland Kitchen atenolol (TENORMIN) 50 MG tablet Take 50 mg by mouth daily.     Marland Kitchen atorvastatin (LIPITOR) 40 MG tablet Take 40 mg by mouth daily.    . baclofen (LIORESAL) 10 MG tablet Take 1 tablet (10 mg total) by mouth 3 (three) times daily. As needed for muscle spasm 50 tablet 0  . citalopram (CELEXA) 40 MG tablet Take 40 mg by mouth daily.    Marland Kitchen dexamethasone (DECADRON) 4 MG tablet 4 mg p.o. twice daily the day before, day of and day after chemotherapy every 3 weeks 40 tablet 1  . folic acid (FOLVITE) 1 MG tablet Take 1 tablet (1 mg total) by mouth daily. 30 tablet 4  . ibuprofen (ADVIL,MOTRIN) 400 MG tablet Take 400 mg by mouth every 4 (four) hours as needed for moderate pain.    Marland Kitchen levothyroxine (SYNTHROID, LEVOTHROID) 200 MCG tablet Take 200 mcg by  mouth daily before breakfast.    . levothyroxine (SYNTHROID, LEVOTHROID) 25 MCG tablet Take 25 mcg by mouth daily before breakfast.    . LORazepam (ATIVAN) 0.5 MG tablet Take 0.5 mg daily as needed by mouth.     . ondansetron (ZOFRAN) 4 MG tablet Take 1 tablet (4 mg total) by mouth every 8 (eight) hours as needed for nausea or vomiting. 30 tablet 0  . oxyCODONE (ROXICODONE) 5 MG immediate release tablet Take 1-2 tablets (5-10 mg total) by mouth every 4 (four) hours as needed for severe pain. 50 tablet 0  . prochlorperazine (COMPAZINE) 10 MG tablet Take 1 tablet (10 mg total) by mouth every 6 (six) hours as needed for nausea or vomiting. 30 tablet 0  . sennosides-docusate sodium (SENOKOT-S) 8.6-50 MG tablet Take 2 tablets by mouth daily. 30 tablet 1  . tamsulosin (FLOMAX) 0.4 MG CAPS capsule Take 0.4 mg daily by mouth.      No current facility-administered medications for this encounter.    Facility-Administered Medications Ordered in Other Encounters  Medication Dose Route Frequency Provider Last Rate Last Dose  . CARBOplatin (PARAPLATIN) 710 mg in sodium chloride 0.9 % 250 mL chemo infusion  710 mg Intravenous Once Curt Bears, MD      .  pembrolizumab (KEYTRUDA) 200 mg in sodium chloride 0.9 % 50 mL chemo infusion  200 mg Intravenous Once Curt Bears, MD 116 mL/hr at 03/07/17 1510 200 mg at 03/07/17 1510  . PEMEtrexed (ALIMTA) 1,200 mg in sodium chloride 0.9 % 100 mL chemo infusion  490 mg/m2 (Treatment Plan Recorded) Intravenous Once Curt Bears, MD        Physical Findings: See chemo infusion vital signs In general this is a well appearing caucasian male in no acute distress. He's alert and oriented x4 and appropriate throughout the examination. Cardiopulmonary assessment is negative for acute distress and he exhibits normal effort.   Lab Findings: Lab Results  Component Value Date   WBC 14.9 (H) 03/07/2017   HGB 15.1 03/07/2017   HCT 45.0 03/07/2017   MCV 96.2  03/07/2017   PLT 235 03/07/2017     Radiographic Findings: Dg C-arm 1-60 Min  Result Date: 02/19/2017 CLINICAL DATA:  Intramedullary nail placement left femur EXAM: DG C-ARM 61-120 MIN; LEFT FEMUR 2 VIEWS COMPARISON:  PET-CT January 12, 2017 FLUOROSCOPY TIME:  1 minutes 14 seconds; 5 acquired images FINDINGS: Frontal and lateral views show screw and nail fixation the left femur. The rod extends through an expansile lesion in the distal femoral diaphysis. There is periosteal elevation in these areas, felt to be of neoplastic etiology. No acute fracture or dislocation. Tip of the screw proximally is in the proximal left femoral head. IMPRESSION: Fixation through neoplastic lesion distal femoral diaphysis. Alignment anatomic. No fracture or dislocation evident. Electronically Signed   By: Lowella Grip III M.D.   On: 02/19/2017 12:07   Dg Femur Min 2 Views Left  Result Date: 02/19/2017 CLINICAL DATA:  Intramedullary nail placement left femur EXAM: DG C-ARM 61-120 MIN; LEFT FEMUR 2 VIEWS COMPARISON:  PET-CT January 12, 2017 FLUOROSCOPY TIME:  1 minutes 14 seconds; 5 acquired images FINDINGS: Frontal and lateral views show screw and nail fixation the left femur. The rod extends through an expansile lesion in the distal femoral diaphysis. There is periosteal elevation in these areas, felt to be of neoplastic etiology. No acute fracture or dislocation. Tip of the screw proximally is in the proximal left femoral head. IMPRESSION: Fixation through neoplastic lesion distal femoral diaphysis. Alignment anatomic. No fracture or dislocation evident. Electronically Signed   By: Lowella Grip III M.D.   On: 02/19/2017 12:07   Dg Femur Port Min 2 Views Left  Result Date: 02/19/2017 CLINICAL DATA:  Intramedullary nailing EXAM: LEFT FEMUR PORTABLE 2 VIEWS COMPARISON:  Intraoperative images February 19, 2017; PET-CT January 12, 2017 FINDINGS: Frontal and lateral views were obtained. There is nail fixation  through a lytic lesion in the femoral diaphysis. There is extensive irregular periosteal reaction in this area consistent with neoplasia. The lucency extends over a distance of approximately 8 cm. Alignment is anatomic in the postoperative area. No fracture or dislocation. Screw tip is in the proximal femoral head. IMPRESSION: Male transfixes neoplastic appearing lytic lesion in the femur at the mid the distal third level with extensive irregular periosteal reaction consistent with neoplastic involvement. Alignment anatomic. No acute fracture or dislocation. Electronically Signed   By: Lowella Grip III M.D.   On: 02/19/2017 13:40    Impression/Plan: 1. Recurrent metastatic Stage IIIA, T2aN2M0, NSCLC, adenocarcinoma of the left lower lobe with probable metastatic disease to the left femur. He will proceed today with his first dose of carboplatin/alimta/keytruda. We will see him back as needed moving forward regarding his cancer care.  Carola Rhine, PAC

## 2017-03-14 ENCOUNTER — Telehealth: Payer: Self-pay | Admitting: Medical Oncology

## 2017-03-14 ENCOUNTER — Other Ambulatory Visit (HOSPITAL_BASED_OUTPATIENT_CLINIC_OR_DEPARTMENT_OTHER): Payer: Medicare Other

## 2017-03-14 DIAGNOSIS — C3492 Malignant neoplasm of unspecified part of left bronchus or lung: Secondary | ICD-10-CM

## 2017-03-14 DIAGNOSIS — C3432 Malignant neoplasm of lower lobe, left bronchus or lung: Secondary | ICD-10-CM | POA: Diagnosis present

## 2017-03-14 LAB — CBC WITH DIFFERENTIAL/PLATELET
BASO%: 0.2 % (ref 0.0–2.0)
BASOS ABS: 0 10*3/uL (ref 0.0–0.1)
EOS%: 2.9 % (ref 0.0–7.0)
Eosinophils Absolute: 0.2 10*3/uL (ref 0.0–0.5)
HCT: 42.9 % (ref 38.4–49.9)
HEMOGLOBIN: 14.8 g/dL (ref 13.0–17.1)
LYMPH%: 29.5 % (ref 14.0–49.0)
MCH: 32.4 pg (ref 27.2–33.4)
MCHC: 34.5 g/dL (ref 32.0–36.0)
MCV: 93.9 fL (ref 79.3–98.0)
MONO#: 0.4 10*3/uL (ref 0.1–0.9)
MONO%: 7.4 % (ref 0.0–14.0)
NEUT#: 3.1 10*3/uL (ref 1.5–6.5)
NEUT%: 60 % (ref 39.0–75.0)
Platelets: 185 10*3/uL (ref 140–400)
RBC: 4.57 10*6/uL (ref 4.20–5.82)
RDW: 13.2 % (ref 11.0–14.6)
WBC: 5.2 10*3/uL (ref 4.0–10.3)
lymph#: 1.5 10*3/uL (ref 0.9–3.3)

## 2017-03-14 LAB — COMPREHENSIVE METABOLIC PANEL
ALT: 16 U/L (ref 0–55)
ANION GAP: 10 meq/L (ref 3–11)
AST: 16 U/L (ref 5–34)
Albumin: 3.8 g/dL (ref 3.5–5.0)
Alkaline Phosphatase: 115 U/L (ref 40–150)
BILIRUBIN TOTAL: 1.11 mg/dL (ref 0.20–1.20)
BUN: 17.1 mg/dL (ref 7.0–26.0)
CALCIUM: 9.2 mg/dL (ref 8.4–10.4)
CO2: 24 meq/L (ref 22–29)
CREATININE: 0.9 mg/dL (ref 0.7–1.3)
Chloride: 102 mEq/L (ref 98–109)
EGFR: >60 mL/min/{1.73_m2} (ref >60)
Glucose: 98 mg/dl (ref 70–140)
Potassium: 4.5 mEq/L (ref 3.5–5.1)
Sodium: 137 mEq/L (ref 136–145)
TOTAL PROTEIN: 6.6 g/dL (ref 6.4–8.3)

## 2017-03-14 NOTE — Telephone Encounter (Signed)
  left message re chemo f/u and to return call with update.

## 2017-03-15 ENCOUNTER — Telehealth: Payer: Self-pay | Admitting: *Deleted

## 2017-03-15 NOTE — Telephone Encounter (Signed)
Called pt regarding Chemo 12/6, discussed neutropenic precautions, good hygiene, hydration  and eating. Pt states he has taken his zofran and compazine sun-thurs, had diarrhea 1-2x a day sunday- Thursday. Feels good today. No further concerns,

## 2017-03-21 ENCOUNTER — Other Ambulatory Visit (HOSPITAL_BASED_OUTPATIENT_CLINIC_OR_DEPARTMENT_OTHER): Payer: Medicare Other

## 2017-03-21 DIAGNOSIS — C3432 Malignant neoplasm of lower lobe, left bronchus or lung: Secondary | ICD-10-CM | POA: Diagnosis present

## 2017-03-21 DIAGNOSIS — C3492 Malignant neoplasm of unspecified part of left bronchus or lung: Secondary | ICD-10-CM

## 2017-03-21 DIAGNOSIS — Z9079 Acquired absence of other genital organ(s): Secondary | ICD-10-CM

## 2017-03-21 DIAGNOSIS — E032 Hypothyroidism due to medicaments and other exogenous substances: Secondary | ICD-10-CM

## 2017-03-21 DIAGNOSIS — I480 Paroxysmal atrial fibrillation: Secondary | ICD-10-CM

## 2017-03-21 DIAGNOSIS — J449 Chronic obstructive pulmonary disease, unspecified: Secondary | ICD-10-CM

## 2017-03-21 DIAGNOSIS — Z79899 Other long term (current) drug therapy: Secondary | ICD-10-CM | POA: Diagnosis not present

## 2017-03-21 DIAGNOSIS — R5382 Chronic fatigue, unspecified: Secondary | ICD-10-CM

## 2017-03-21 LAB — CBC WITH DIFFERENTIAL/PLATELET
BASO%: 0.6 % (ref 0.0–2.0)
Basophils Absolute: 0 10*3/uL (ref 0.0–0.1)
EOS%: 1 % (ref 0.0–7.0)
Eosinophils Absolute: 0 10*3/uL (ref 0.0–0.5)
HCT: 42.6 % (ref 38.4–49.9)
HGB: 14.4 g/dL (ref 13.0–17.1)
LYMPH#: 1.3 10*3/uL (ref 0.9–3.3)
LYMPH%: 28.9 % (ref 14.0–49.0)
MCH: 32.6 pg (ref 27.2–33.4)
MCHC: 33.9 g/dL (ref 32.0–36.0)
MCV: 96.2 fL (ref 79.3–98.0)
MONO#: 0.6 10*3/uL (ref 0.1–0.9)
MONO%: 12.8 % (ref 0.0–14.0)
NEUT%: 56.7 % (ref 39.0–75.0)
NEUTROS ABS: 2.5 10*3/uL (ref 1.5–6.5)
PLATELETS: 127 10*3/uL — AB (ref 140–400)
RBC: 4.42 10*6/uL (ref 4.20–5.82)
RDW: 13.9 % (ref 11.0–14.6)
WBC: 4.4 10*3/uL (ref 4.0–10.3)

## 2017-03-21 LAB — COMPREHENSIVE METABOLIC PANEL
ALBUMIN: 3.7 g/dL (ref 3.5–5.0)
ALK PHOS: 100 U/L (ref 40–150)
ALT: 25 U/L (ref 0–55)
AST: 18 U/L (ref 5–34)
Anion Gap: 8 mEq/L (ref 3–11)
BILIRUBIN TOTAL: 0.67 mg/dL (ref 0.20–1.20)
BUN: 18.2 mg/dL (ref 7.0–26.0)
CALCIUM: 9.4 mg/dL (ref 8.4–10.4)
CO2: 25 mEq/L (ref 22–29)
Chloride: 104 mEq/L (ref 98–109)
Creatinine: 1.1 mg/dL (ref 0.7–1.3)
Glucose: 109 mg/dl (ref 70–140)
Sodium: 138 mEq/L (ref 136–145)
Total Protein: 6.6 g/dL (ref 6.4–8.3)

## 2017-03-21 LAB — TSH: TSH: 3.09 m(IU)/L (ref 0.320–4.118)

## 2017-03-28 ENCOUNTER — Telehealth: Payer: Self-pay | Admitting: Oncology

## 2017-03-28 ENCOUNTER — Ambulatory Visit (HOSPITAL_BASED_OUTPATIENT_CLINIC_OR_DEPARTMENT_OTHER): Payer: Medicare Other

## 2017-03-28 ENCOUNTER — Other Ambulatory Visit (HOSPITAL_BASED_OUTPATIENT_CLINIC_OR_DEPARTMENT_OTHER): Payer: Medicare Other

## 2017-03-28 ENCOUNTER — Encounter: Payer: Self-pay | Admitting: Oncology

## 2017-03-28 ENCOUNTER — Ambulatory Visit (HOSPITAL_BASED_OUTPATIENT_CLINIC_OR_DEPARTMENT_OTHER): Payer: Medicare Other | Admitting: Oncology

## 2017-03-28 ENCOUNTER — Ambulatory Visit (HOSPITAL_BASED_OUTPATIENT_CLINIC_OR_DEPARTMENT_OTHER): Payer: Medicare Other | Admitting: Medical

## 2017-03-28 VITALS — BP 125/77 | HR 107 | Temp 97.7°F | Resp 19 | Ht 73.0 in | Wt 255.3 lb

## 2017-03-28 VITALS — BP 129/77 | HR 73 | Temp 98.4°F

## 2017-03-28 DIAGNOSIS — C3490 Malignant neoplasm of unspecified part of unspecified bronchus or lung: Secondary | ICD-10-CM | POA: Diagnosis not present

## 2017-03-28 DIAGNOSIS — Z79899 Other long term (current) drug therapy: Secondary | ICD-10-CM | POA: Diagnosis not present

## 2017-03-28 DIAGNOSIS — C3432 Malignant neoplasm of lower lobe, left bronchus or lung: Secondary | ICD-10-CM

## 2017-03-28 DIAGNOSIS — C3492 Malignant neoplasm of unspecified part of left bronchus or lung: Secondary | ICD-10-CM

## 2017-03-28 DIAGNOSIS — R5382 Chronic fatigue, unspecified: Secondary | ICD-10-CM

## 2017-03-28 DIAGNOSIS — Z5111 Encounter for antineoplastic chemotherapy: Secondary | ICD-10-CM

## 2017-03-28 DIAGNOSIS — Z125 Encounter for screening for malignant neoplasm of prostate: Secondary | ICD-10-CM | POA: Insufficient documentation

## 2017-03-28 DIAGNOSIS — T451X5A Adverse effect of antineoplastic and immunosuppressive drugs, initial encounter: Secondary | ICD-10-CM

## 2017-03-28 DIAGNOSIS — C7951 Secondary malignant neoplasm of bone: Secondary | ICD-10-CM

## 2017-03-28 DIAGNOSIS — Z5112 Encounter for antineoplastic immunotherapy: Secondary | ICD-10-CM

## 2017-03-28 LAB — CBC WITH DIFFERENTIAL/PLATELET
BASO%: 0 % (ref 0.0–2.0)
Basophils Absolute: 0 10*3/uL (ref 0.0–0.1)
EOS%: 0 % (ref 0.0–7.0)
Eosinophils Absolute: 0 10*3/uL (ref 0.0–0.5)
HCT: 40.5 % (ref 38.4–49.9)
HEMOGLOBIN: 14.2 g/dL (ref 13.0–17.1)
LYMPH%: 12.5 % — ABNORMAL LOW (ref 14.0–49.0)
MCH: 33 pg (ref 27.2–33.4)
MCHC: 35.1 g/dL (ref 32.0–36.0)
MCV: 94.2 fL (ref 79.3–98.0)
MONO#: 0.7 10*3/uL (ref 0.1–0.9)
MONO%: 7.5 % (ref 0.0–14.0)
NEUT%: 80 % — ABNORMAL HIGH (ref 39.0–75.0)
NEUTROS ABS: 7.5 10*3/uL — AB (ref 1.5–6.5)
Platelets: 228 10*3/uL (ref 140–400)
RBC: 4.3 10*6/uL (ref 4.20–5.82)
RDW: 14.2 % (ref 11.0–14.6)
WBC: 9.4 10*3/uL (ref 4.0–10.3)
lymph#: 1.2 10*3/uL (ref 0.9–3.3)

## 2017-03-28 LAB — COMPREHENSIVE METABOLIC PANEL
ALBUMIN: 3.8 g/dL (ref 3.5–5.0)
ALK PHOS: 98 U/L (ref 40–150)
ALT: 18 U/L (ref 0–55)
AST: 13 U/L (ref 5–34)
Anion Gap: 12 mEq/L — ABNORMAL HIGH (ref 3–11)
BUN: 17.2 mg/dL (ref 7.0–26.0)
CO2: 20 mEq/L — ABNORMAL LOW (ref 22–29)
Calcium: 9.5 mg/dL (ref 8.4–10.4)
Chloride: 107 mEq/L (ref 98–109)
Creatinine: 0.9 mg/dL (ref 0.7–1.3)
EGFR: >60 mL/min/{1.73_m2} (ref >60)
GLUCOSE: 146 mg/dL — AB (ref 70–140)
POTASSIUM: 4.3 meq/L (ref 3.5–5.1)
SODIUM: 139 meq/L (ref 136–145)
TOTAL PROTEIN: 6.9 g/dL (ref 6.4–8.3)
Total Bilirubin: 0.48 mg/dL (ref 0.20–1.20)

## 2017-03-28 LAB — TSH: TSH: 1.479 m[IU]/L (ref 0.320–4.118)

## 2017-03-28 MED ORDER — ALBUTEROL SULFATE (2.5 MG/3ML) 0.083% IN NEBU
2.5000 mg | INHALATION_SOLUTION | Freq: Once | RESPIRATORY_TRACT | Status: AC | PRN
  Administered 2017-03-28: 2.5 mg via RESPIRATORY_TRACT
  Filled 2017-03-28: qty 3

## 2017-03-28 MED ORDER — SODIUM CHLORIDE 0.9 % IV SOLN
200.0000 mg | Freq: Once | INTRAVENOUS | Status: AC
  Administered 2017-03-28: 200 mg via INTRAVENOUS
  Filled 2017-03-28: qty 8

## 2017-03-28 MED ORDER — SODIUM CHLORIDE 0.9 % IV SOLN
Freq: Once | INTRAVENOUS | Status: AC
  Administered 2017-03-28: 10:00:00 via INTRAVENOUS
  Filled 2017-03-28: qty 5

## 2017-03-28 MED ORDER — SODIUM CHLORIDE 0.9 % IV SOLN
490.0000 mg/m2 | Freq: Once | INTRAVENOUS | Status: AC
  Administered 2017-03-28: 1200 mg via INTRAVENOUS
  Filled 2017-03-28: qty 40

## 2017-03-28 MED ORDER — DIPHENHYDRAMINE HCL 50 MG/ML IJ SOLN
25.0000 mg | Freq: Once | INTRAMUSCULAR | Status: AC | PRN
  Administered 2017-03-28: 50 mg via INTRAVENOUS

## 2017-03-28 MED ORDER — LORAZEPAM 2 MG/ML IJ SOLN
INTRAMUSCULAR | Status: AC
  Filled 2017-03-28: qty 1

## 2017-03-28 MED ORDER — PALONOSETRON HCL INJECTION 0.25 MG/5ML
INTRAVENOUS | Status: AC
  Filled 2017-03-28: qty 5

## 2017-03-28 MED ORDER — FAMOTIDINE IN NACL 20-0.9 MG/50ML-% IV SOLN
20.0000 mg | Freq: Once | INTRAVENOUS | Status: AC | PRN
  Administered 2017-03-28: 20 mg via INTRAVENOUS

## 2017-03-28 MED ORDER — METHYLPREDNISOLONE SODIUM SUCC 125 MG IJ SOLR
125.0000 mg | Freq: Once | INTRAMUSCULAR | Status: AC | PRN
  Administered 2017-03-28: 125 mg via INTRAVENOUS

## 2017-03-28 MED ORDER — SODIUM CHLORIDE 0.9 % IV SOLN
710.0000 mg | Freq: Once | INTRAVENOUS | Status: AC
  Administered 2017-03-28: 710 mg via INTRAVENOUS
  Filled 2017-03-28: qty 71

## 2017-03-28 MED ORDER — PALONOSETRON HCL INJECTION 0.25 MG/5ML
0.2500 mg | Freq: Once | INTRAVENOUS | Status: AC
  Administered 2017-03-28: 0.25 mg via INTRAVENOUS

## 2017-03-28 MED ORDER — SODIUM CHLORIDE 0.9 % IV SOLN
Freq: Once | INTRAVENOUS | Status: AC
  Administered 2017-03-28: 09:00:00 via INTRAVENOUS

## 2017-03-28 MED ORDER — LORAZEPAM 2 MG/ML IJ SOLN
0.5000 mg | Freq: Once | INTRAMUSCULAR | Status: AC
  Administered 2017-03-28: 0.5 mg via INTRAVENOUS

## 2017-03-28 NOTE — Assessment & Plan Note (Signed)
This is a 66 year old white male with metastatic non-small cell lung cancer, adenocarcinoma with no actionable mutations and PDL 1 expression of 5% that was initially diagnosed as stage IIIa non-small cell lung cancer, adenocarcinoma status post left lower lobectomy with lymph node dissection followed by a course of concurrent chemoradiation completed in January 2016.  The patient had evidence for disease metastasis in October 2018 with metastatic disease to the left femur as well as left supraclavicular and right paratracheal lymph nodes. The patient is currently on palliative systemic chemotherapy with carboplatin for AUC of 5, Alimta 500 mg/M2 and Keytruda 200 mg IV every 3 weeks.  Status post 1 cycle.  He tolerated his treatment well with the exception of fatigue.  The patient will proceed with cycle 2 of his chemotherapy today as scheduled. The patient will come back for follow-up visit in 3 weeks with the start of cycle #3. For pain management he would continue with the current pain medication for now.  He was advised to call immediately if he has any concerning symptoms in the interval.  All questions were answered. The patient knows to call the clinic with any problems, questions or concerns. We can certainly see the patient much sooner if necessary.

## 2017-03-28 NOTE — Telephone Encounter (Signed)
Scheduled appt per 12/27 los - patient to get an updated schedule in the treatment area.

## 2017-03-28 NOTE — Progress Notes (Signed)
[facility] OFFICE PROGRESS NOTE  [name], MD [address] [place] [address]  DIAGNOSIS: Metastatic non-small cell lung cancer initially diagnosed as stage IIIA (T2a, N2, M0) non-small cell lung cancer, poorly differentiated adenocarcinoma presented with left lower lobe lung mass in addition to mediastinal lymphadenopathy.  The patient was diagnosed with metastatic disease involving the left femur as well as left supraclavicular nodal metastases and right paratracheal lymphadenopathy in October 2018.  Biomarker Findings Microsatellite Status - MS-Stable Tumor Mutational Burden - TMB-Low (3 Muts/Mb) Genomic Findings For a complete list of the genes assayed, please refer to the Appendix. STK11 P261f*6 [id number] site 1993+5T>SDAXX E374* MLL2 [id number] NBN K23386f NOTCH2 [id number] splice site [id number]+0P>QDisease relevant genes with no reportable alterations: EGFR, KRAS, ALK, BRAF, MET, RET, ERBB2, ROS1   PDL1 expression 5%  PRIOR THERAPY: 1) status post wedge resection of the left lower lobe lung mass as well as AP window lymph node dissection but there was residual metastatic mediastinal lymphadenopathy that could not be resected. 2) a course of concurrent chemoradiation [place]  3) status post palliative radiotherapy to the left femur metastatic bone disease.  CURRENT THERAPY: Systemic chemotherapy with carboplatin for AC of 5, Alimta 500 mg/M2 and Keytruda 200 mg IV every 3 weeks.  First dose March 07, 2017.  Status post 1 cycle.  INTERVAL HISTORY: Victor Huber 28o. male returns for routine follow-up visit accompanied by his son.  The patient is feeling fine today and has no complaints except for mild fatigue.  Patient tolerated his first cycle of chemotherapy well overall with the exception of mild nausea which was relieved with antiemetics.  He denies fevers  and chills.  Denies chest pain, shortness of breath, cough, hemoptysis.  Denies nausea, vomiting, constipation, diarrhea.  The patient is here for evaluation prior to cycle 2 of his chemotherapy.  MEDICAL HISTORY: Past Medical History:  Diagnosis Date  . Adenocarcinoma of left lung, stage 3 ([facility]/16/2018  . Arthritis   . Atrial fibrillation ([facility]/16/2018  . Atrial fibrillation ([facility] . COPD (chronic obstructive pulmonary disease) ([facility]/16/2018  . History of chemotherapy   . History of radiation therapy   . Hyperlipidemia   . Hypothyroid 08/15/2016  . Longstanding persistent atrial fibrillation ([facility]/30/2018  . Pathologic fracture    left femur  . Pneumonitis   . S/P TURP 08/15/2016  . Wears glasses   . Wears hearing aid in both ears     ALLERGIES:  is allergic to flecainide.  MEDICATIONS:  Current Outpatient Medications  Medication Sig Dispense Refill  . apixaban (ELIQUIS) 5 MG TABS tablet Take 5 mg by mouth 2 (two) times daily.    . aMarland Kitchenenolol (TENORMIN) 50 MG tablet Take 50 mg by mouth daily.     . aMarland Kitchenorvastatin (LIPITOR) 40 MG tablet Take 40 mg by mouth daily.    . baclofen (LIORESAL) 10 MG tablet Take 1 tablet (10 mg total) by mouth 3 (three) times daily. As needed for muscle spasm 50 tablet 0  . citalopram (CELEXA) 40 MG tablet Take 40 mg by mouth daily.    . dMarland Kitchenxamethasone (DECADRON) 4 MG tablet 4 mg p.o. twice daily the day before, day of and day after chemotherapy every 3 weeks 40 tablet 1  . folic acid (FOLVITE) 1 MG tablet Take 1 tablet (1 mg total) by mouth daily. 30 tablet 4  . levothyroxine (  SYNTHROID, LEVOTHROID) 200 MCG tablet Take 200 mcg by mouth daily before breakfast.    . LORazepam (ATIVAN) 0.5 MG tablet Take 0.5 mg daily as needed by mouth.     . ondansetron (ZOFRAN) 4 MG tablet Take 1 tablet (4 mg total) by mouth every 8 (eight) hours as needed for nausea or vomiting. 30 tablet 0  . oxyCODONE (ROXICODONE) 5 MG immediate release tablet Take 1-2 tablets  (5-10 mg total) by mouth every 4 (four) hours as needed for severe pain. 50 tablet 0  . prochlorperazine (COMPAZINE) 10 MG tablet Take 1 tablet (10 mg total) by mouth every 6 (six) hours as needed for nausea or vomiting. 30 tablet 0  . sennosides-docusate sodium (SENOKOT-S) 8.6-50 MG tablet Take 2 tablets by mouth daily. 30 tablet 1  . tamsulosin (FLOMAX) 0.4 MG CAPS capsule Take 0.4 mg daily by mouth.     . zolpidem (AMBIEN) 5 MG tablet Take 5 mg by mouth at bedtime as needed for sleep.    Marland Kitchen ibuprofen (ADVIL,MOTRIN) 400 MG tablet Take 400 mg by mouth every 4 (four) hours as needed for moderate pain.     No current facility-administered medications for this visit.     SURGICAL HISTORY:  Past Surgical History:  Procedure Laterality Date  . BRONCHOSCOPY  10/2014  . CARDIAC CATHETERIZATION     05/07/12  . CARDIOVERSION     x2  . COLONOSCOPY    . DG BIOPSY LUNG Left 10/2014   FNA - Adenocarcinoma   . FEMUR IM NAIL Left 02/19/2017  . FEMUR IM NAIL Left 02/19/2017   Procedure: INTRAMEDULLARY (IM) NAIL FEMORAL;  Surgeon: Marchia Bond, MD;  Location: [facility];  Service: Orthopedics;  Laterality: Left;  . LUNG CANCER SURGERY Left 12/2014   Wedge Resection   . MULTIPLE TOOTH EXTRACTIONS    . Status post TURP    . TONSILLECTOMY      REVIEW OF SYSTEMS:   Review of Systems  Constitutional: Negative for appetite change, chills, fever and unexpected weight change. Positive for fatigue. HENT:   Negative for mouth sores, nosebleeds, sore throat and trouble swallowing.   Eyes: Negative for eye problems and icterus.  Respiratory: Negative for cough, hemoptysis, shortness of breath and wheezing.   Cardiovascular: Negative for chest pain and leg swelling.  Gastrointestinal: Negative for abdominal pain, constipation, diarrhea, nausea and vomiting.  Genitourinary: Negative for bladder incontinence, difficulty urinating, dysuria, frequency and hematuria.   Musculoskeletal: Negative for back pain, gait  problem, neck pain and neck stiffness.  Skin: Negative for itching and rash.  Neurological: Negative for dizziness, extremity weakness, gait problem, headaches, light-headedness and seizures.  Hematological: Negative for adenopathy. Does not bruise/bleed easily.  Psychiatric/Behavioral: Negative for confusion, depression and sleep disturbance. The patient is not nervous/anxious.     PHYSICAL EXAMINATION:  Blood pressure 125/77, pulse (!) 107, temperature 97.7 F (36.5 C), temperature source Oral, resp. rate 19, height _0  (1.854 m), weight 255 lb 4.8 oz (115.8 kg), SpO2 100 %.  ECOG PERFORMANCE STATUS: 1 - Symptomatic but completely ambulatory  Physical Exam  Constitutional: Oriented to person, place, and time and well-developed, well-nourished, and in no distress. No distress.  HENT:  Head: Normocephalic and atraumatic.  Mouth/Throat: Oropharynx is clear and moist. No oropharyngeal exudate.  Eyes: Conjunctivae are normal. Right eye exhibits no discharge. Left eye exhibits no discharge. No scleral icterus.  Neck: Normal range of motion. Neck supple.  Cardiovascular: Normal rate, regular rhythm, normal heart sounds and intact distal  pulses.   Pulmonary/Chest: Effort normal and breath sounds normal. No respiratory distress. No wheezes. No rales.  Abdominal: Soft. Bowel sounds are normal. Exhibits no distension and no mass. There is no tenderness.  Musculoskeletal: Normal range of motion. Exhibits no edema.  Lymphadenopathy:    No cervical adenopathy.  Neurological: Alert and oriented to person, place, and time. Exhibits normal muscle tone. Gait normal. Coordination normal.  Skin: Skin is warm and dry. No rash noted. Not diaphoretic. No erythema. No pallor.  Psychiatric: Mood, memory and judgment normal.  Vitals reviewed.  LABORATORY DATA: Lab Results  Component Value Date   WBC 9.4 03/28/2017   HGB 14.2 03/28/2017   HCT 40.5 03/28/2017   MCV 94.2 03/28/2017   PLT 228 03/28/2017       Chemistry      Component Value Date/Time   NA 139 03/28/2017 0819   K 4.3 03/28/2017 0819   CL 105 02/20/2017 0722   CO2 20 (L) 03/28/2017 0819   BUN 17.2 03/28/2017 0819   CREATININE 0.9 03/28/2017 0819      Component Value Date/Time   CALCIUM 9.5 03/28/2017 0819   ALKPHOS 98 03/28/2017 0819   AST 13 03/28/2017 0819   ALT 18 03/28/2017 0819   BILITOT 0.48 03/28/2017 0819       RADIOGRAPHIC STUDIES:  No results found.   ASSESSMENT/PLAN:  Adenocarcinoma of left lung, stage 4 (HCC) This is a 66 year old white male with metastatic non-small cell lung cancer, adenocarcinoma with no actionable mutations and PDL 1 expression of 5% that was initially diagnosed as stage IIIa non-small cell lung cancer, adenocarcinoma status post left lower lobectomy with lymph node dissection followed by a course of concurrent chemoradiation completed in January 2016.  The patient had evidence for disease metastasis in October 2018 with metastatic disease to the left femur as well as left supraclavicular and right paratracheal lymph nodes. The patient is currently on palliative systemic chemotherapy with carboplatin for AUC of 5, Alimta 500 mg/M2 and Keytruda 200 mg IV every 3 weeks.  Status post 1 cycle.  He tolerated his treatment well with the exception of fatigue.  The patient will proceed with cycle 2 of his chemotherapy today as scheduled. The patient will come back for follow-up visit in 3 weeks with the start of cycle #3. For pain management he would continue with the current pain medication for now.  He was advised to call immediately if he has any concerning symptoms in the interval.  All questions were answered. The patient knows to call the clinic with any problems, questions or concerns. We can certainly see the patient much sooner if necessary.  No orders of the defined types were placed in this encounter.   Victor Bussing, DNP, AGPCNP-BC, AOCNP 03/28/17

## 2017-03-28 NOTE — Progress Notes (Signed)
1205: Pt reports feeling "hot and Sick to my stomach". Face noted to be bright red. Carboplatin stopped and NS started to gravity. Lucianne Lei at chairside to assess, medications given as ordered.  1210: Pt complains of hands itching, hands noted to be red.   1230: Dr. Julien Nordmann to chairside to assess. Per Dr. Julien Nordmann okay to discharge pt after albuterol treatment and Ativan.  1250: Van at chairside, pt and VS stable Pt given hypersensitivity protocol and educated to begin to take Benadryl 25 mg po every 6 hours and Pepcid 20 mg PO every 12 hours for the next two days and to call clinic or report to ER if symptoms consist or worsens. Pt verbalizes understanding.  Pt states hands itch but is better and nausea is still present but better, no longer feels hot, no further flushing noted, hands continue to be slightly pink.  Okay to discharge home per [place] PA, Pt and VS stable at discharge.

## 2017-03-28 NOTE — Patient Instructions (Signed)
[facility] Discharge Instructions for Patients Receiving Chemotherapy  Today you received the following chemotherapy agents: Keytruda, Alimta and Carboplatin   To help prevent nausea and vomiting after your treatment, we encourage you to take your nausea medication as directed.    If you develop nausea and vomiting that is not controlled by your nausea medication, call the clinic.   BELOW ARE SYMPTOMS THAT SHOULD BE REPORTED IMMEDIATELY:  *FEVER GREATER THAN 100.5 F  *CHILLS WITH OR WITHOUT FEVER  NAUSEA AND VOMITING THAT IS NOT CONTROLLED WITH YOUR NAUSEA MEDICATION  *UNUSUAL SHORTNESS OF BREATH  *UNUSUAL BRUISING OR BLEEDING  TENDERNESS IN MOUTH AND THROAT WITH OR WITHOUT PRESENCE OF ULCERS  *URINARY PROBLEMS  *BOWEL PROBLEMS  UNUSUAL RASH Items with * indicate a potential emergency and should be followed up as soon as possible.  Feel free to call the clinic should you have any questions or concerns. The clinic phone number is [phone].  Please show the [facility] at check-in to the Emergency Department and triage nurse.

## 2017-03-29 NOTE — Progress Notes (Signed)
Symptoms Management Clinic Progress Note   [name] [id number] 08-22-1950 66 y.o.  Victor Huber is managed by Dr. Eilleen Kempf  Actively treated with chemotherapy: yes  Current Therapy: Carboplatin, Keytruda, and Alimta  Last Treated: 03/28/2017  Assessment: Plan:    Adverse effect of chemotherapy, initial encounter  Victor Huber was seen in the infusion room for a suspected chemotherapy reaction. He was receiving carboplatin at the time of his reaction. He had received a total of one half of his total infusion prior to onset of symptoms. His symptoms included: Flushing, nausea, and itching He was premedicated with Aloxi, Emend, and Decadron prior to starting chemotherapy. Carboplatin was paused and CAELEN REIERSON was given Benadryl 50 mg IV, Pepcid 20 mg IV, Solu-Medrol 125 mg IV, and albuterol nebulizer, and Ativan 0.5 mg IV after onset of his symptoms. Victor Huber did  respond to intervention.   This case was discussed with Dr. Julien Nordmann who also saw the patient, who directed that the following be done: Carboplatin will be eliminated from the patient's treatment plan.  Please see After Visit Summary for patient specific instructions.  Future Appointments  Date Time Provider Lindsay  04/04/2017 11:30 AM [facility] 6 [facility] None  04/11/2017 11:30 AM [facility] 5 [facility] None  04/18/2017  8:15 AM [facility] LAB 2 [facility] None  04/18/2017  8:45 AM Curt Bears, MD [facility] None  04/18/2017  9:30 AM [facility] H28 [facility] None  04/25/2017 11:30 AM [facility] LAB 4 [facility] None  05/02/2017 11:30 AM [facility] LAB 3 [facility] None  05/09/2017  8:15 AM [facility] LAB 4 [facility] None  05/09/2017  8:45 AM Curt Bears, MD [facility] None  05/09/2017  9:30 AM [facility] G22 [facility] None  05/16/2017 11:30 AM [facility] LAB 2 [facility] None  05/23/2017 11:30 AM [facility] LAB 2 [facility] None    05/30/2017  9:15 AM [facility] LAB 3 [facility] None  05/30/2017  9:45 AM Curt Bears, MD [facility] None  05/30/2017 11:00 AM [facility] G24 [facility] None    No orders of the defined types were placed in this encounter.      Subjective:   Patient ID:  Victor Huber is a 66 y.o. (DOB Sep 07, 1950) male.  Chief Complaint: No chief complaint on file.   HPI NUR KRASINSKI was seen in the infusion room for a suspected reaction to chemotherapy. He was receiving carboplatin at the time of his reaction. He had received a total of one half of his total infusion prior to onset of symptoms. His symptoms included: Flushing, nausea, and itching. He was premedicated with Aloxi, Emend, and Decadron prior to starting chemotherapy. Carboplatin was paused and Victor Huber was given Benadryl 50 mg IV, Pepcid 20 mg IV, Solu-Medrol 125 mg IV, and albuterol nebulizer, and Ativan 0.5 mg IV after onset of his symptoms. Victor Huber did  respond to intervention. This case was discussed with Dr. Julien Nordmann who also saw the patient, who directed that the following be done: Carboplatin will be eliminated from the patient's treatment plan.  Medications: I have reviewed the patient's current medications.  Allergies:  Allergies  Allergen Reactions  . Flecainide Hypertension    CAUSED HEART ISSUES     Past Medical History:  Diagnosis Date  . Adenocarcinoma of left lung, stage 3 ([place] [facility]) 08/15/2016  . Arthritis   . Atrial fibrillation ([place] [facility]) 08/15/2016  . Atrial fibrillation ([facility])   . COPD (chronic obstructive pulmonary disease) ([facility]) 08/15/2016  . History  of chemotherapy   . History of radiation therapy   . Hyperlipidemia   . Hypothyroid 08/15/2016  . Longstanding persistent atrial fibrillation ([facility]) 08/29/2016  . Pathologic fracture    left femur  . Pneumonitis   . S/P TURP 08/15/2016  . Wears glasses   . Wears hearing aid in both ears     Past Surgical History:  Procedure Laterality  Date  . BRONCHOSCOPY  10/2014  . CARDIAC CATHETERIZATION     05/07/12  . CARDIOVERSION     x2  . COLONOSCOPY    . DG BIOPSY LUNG Left 10/2014   FNA - Adenocarcinoma   . FEMUR IM NAIL Left 02/19/2017  . FEMUR IM NAIL Left 02/19/2017   Procedure: INTRAMEDULLARY (IM) NAIL FEMORAL;  Surgeon: Marchia Bond, MD;  Location: [facility];  Service: Orthopedics;  Laterality: Left;  . LUNG CANCER SURGERY Left 12/2014   Wedge Resection   . MULTIPLE TOOTH EXTRACTIONS    . Status post TURP    . TONSILLECTOMY      Family History  Problem Relation Age of Onset  . Breast cancer Mother   . Breast cancer Maternal Aunt   . Breast cancer Maternal Aunt   . Lung disease Neg Hx     Social History   Socioeconomic History  . Marital status: Married    Spouse name: Not on file  . Number of children: Not on file  . Years of education: Not on file  . Highest education level: Not on file  Social Needs  . Financial resource strain: Not on file  . Food insecurity - worry: Not on file  . Food insecurity - inability: Not on file  . Transportation needs - medical: Not on file  . Transportation needs - non-medical: Not on file  Occupational History  . Not on file  Tobacco Use  . Smoking status: Former Smoker    Packs/day: 1.00    Years: 50.00    Pack years: 50.00    Types: Cigarettes    Last attempt to quit: 08/15/2012    Years since quitting: 4.6  . Smokeless tobacco: Never Used  Substance and Sexual Activity  . Alcohol use: No  . Drug use: No  . Sexual activity: Not on file  Other Topics Concern  . Not on file  Social History Narrative   [facility] (09/26/16):   Originally from [place]. Moved to [facility] February 2018. Always lived in [place]. Moved to be closer to children & grandchildren. No international travel. 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 No mold exposure. No bird exposure or hot tub exposure. Enjoys reading.  Previously enjoyed wood working with domestic woods.     Past Medical History, Surgical history, Social history, and Family history were reviewed and updated as appropriate.   Please see review of systems for further details on the patient's review from today.   Review of Systems:  Review of Systems  Constitutional: Negative for chills, diaphoresis and fever.  HENT: Negative for congestion and trouble swallowing.   Respiratory: Negative for cough, choking, chest tightness and shortness of breath.   Cardiovascular: Negative for chest pain and palpitations.  Gastrointestinal: Positive for nausea. Negative for constipation, diarrhea and vomiting.  Skin:       Flushing of the face and neck with itching of the hands.  Neurological: Negative for headaches.    Objective:   Physical Exam:  There were no vitals taken  for this visit. ECOG: 0  Physical Exam  Constitutional: No distress.  HENT:  Head: Normocephalic.  Flushing of the face and neck  Cardiovascular: Normal rate, regular rhythm and normal heart sounds. Exam reveals no gallop and no friction rub.  No murmur heard. Pulmonary/Chest: Effort normal and breath sounds normal. No respiratory distress. He has no wheezes. He has no rales.  Musculoskeletal: He exhibits no edema.  Neurological: He is alert.  Skin: No rash noted. He is not diaphoretic. There is erythema.    Lab Review:     Component Value Date/Time   NA 139 03/28/2017 0819   K 4.3 03/28/2017 0819   CL 105 02/20/2017 0722   CO2 20 (L) 03/28/2017 0819   GLUCOSE 146 (H) 03/28/2017 0819   BUN 17.2 03/28/2017 0819   CREATININE 0.9 03/28/2017 0819   CALCIUM 9.5 03/28/2017 0819   PROT 6.9 03/28/2017 0819   ALBUMIN 3.8 03/28/2017 0819   AST 13 03/28/2017 0819   ALT 18 03/28/2017 0819   ALKPHOS 98 03/28/2017 0819   BILITOT 0.48 03/28/2017 0819   GFRNONAA >60 02/20/2017 0722   GFRAA >60 02/20/2017 0722       Component Value Date/Time   WBC 9.4 03/28/2017 0819     WBC 6.5 02/20/2017 0722   RBC 4.30 03/28/2017 0819   RBC 3.84 (L) 02/20/2017 0722   HGB 14.2 03/28/2017 0819   HCT 40.5 03/28/2017 0819   PLT 228 03/28/2017 0819   MCV 94.2 03/28/2017 0819   MCH 33.0 03/28/2017 0819   MCH 32.8 02/20/2017 0722   MCHC 35.1 03/28/2017 0819   MCHC 34.0 02/20/2017 0722   RDW 14.2 03/28/2017 0819   LYMPHSABS 1.2 03/28/2017 0819   MONOABS 0.7 03/28/2017 0819   EOSABS 0.0 03/28/2017 0819   BASOSABS 0.0 03/28/2017 0819   -------------------------------  Imaging from last 24 hours (if applicable):  Radiology interpretation: No results found.   ADDENDUM: Hematology/Oncology Attending: I had a face-to-face encounter with the patient.  I recommended his care plan.  This is a very pleasant 66 years old white male diagnosed with a stage IV non-small cell lung cancer with no actionable mutations and currently undergoing systemic chemotherapy with carboplatin, Alimta and Keytruda status post 1 cycle.  The patient was undergoing cycle #2 today.  He started developing flushing of his face, itching as well as nausea and vomiting in the middle of his carboplatin infusion.  This was consistent with carboplatin infusion reaction.  The patient was treated with IV steroids as well as Benadryl, Pepcid and nebulizer.   He started feeling better.  Carboplatin was discontinued and it will be added to his allergy list.  I recommended for the patient to continue his current treatment with only Alimta and Keytruda starting from cycle #3. I would see the patient back for follow-up visit in 3 weeks as previously scheduled before starting cycle #3. The patient was advised to call immediately if he has any concerning symptoms in the interval.  Disclaimer: This note was dictated with voice recognition software. Similar sounding words can inadvertently be transcribed and may be missed upon review. Eilleen Kempf, MD 03/31/17

## 2017-03-31 ENCOUNTER — Encounter: Payer: Self-pay | Admitting: Medical

## 2017-04-04 ENCOUNTER — Ambulatory Visit (HOSPITAL_BASED_OUTPATIENT_CLINIC_OR_DEPARTMENT_OTHER): Payer: Medicare Other | Admitting: Medical

## 2017-04-04 ENCOUNTER — Other Ambulatory Visit: Payer: Self-pay | Admitting: Medical Oncology

## 2017-04-04 ENCOUNTER — Telehealth: Payer: Self-pay | Admitting: Medical Oncology

## 2017-04-04 ENCOUNTER — Other Ambulatory Visit (HOSPITAL_BASED_OUTPATIENT_CLINIC_OR_DEPARTMENT_OTHER): Payer: Medicare Other

## 2017-04-04 ENCOUNTER — Ambulatory Visit (HOSPITAL_COMMUNITY)
Admission: RE | Admit: 2017-04-04 | Discharge: 2017-04-04 | Disposition: A | Discharge status: Home / Self Care | Payer: Medicare Other | Source: Ambulatory Visit | Attending: Medical | Admitting: Medical

## 2017-04-04 VITALS — BP 125/58 | HR 130 | Temp 97.8°F | Resp 24 | Ht 73.0 in | Wt 250.6 lb

## 2017-04-04 DIAGNOSIS — R918 Other nonspecific abnormal finding of lung field: Secondary | ICD-10-CM | POA: Diagnosis not present

## 2017-04-04 DIAGNOSIS — R509 Fever, unspecified: Secondary | ICD-10-CM | POA: Insufficient documentation

## 2017-04-04 DIAGNOSIS — R059 Cough, unspecified: Secondary | ICD-10-CM

## 2017-04-04 DIAGNOSIS — R05 Cough: Secondary | ICD-10-CM

## 2017-04-04 DIAGNOSIS — J441 Chronic obstructive pulmonary disease with (acute) exacerbation: Secondary | ICD-10-CM

## 2017-04-04 DIAGNOSIS — C3432 Malignant neoplasm of lower lobe, left bronchus or lung: Secondary | ICD-10-CM | POA: Diagnosis not present

## 2017-04-04 DIAGNOSIS — C3492 Malignant neoplasm of unspecified part of left bronchus or lung: Secondary | ICD-10-CM

## 2017-04-04 LAB — COMPREHENSIVE METABOLIC PANEL
ALBUMIN: 3.5 g/dL (ref 3.5–5.0)
ALT: 18 U/L (ref 0–55)
AST: 17 U/L (ref 5–34)
Alkaline Phosphatase: 89 U/L (ref 40–150)
Anion Gap: 10 mEq/L (ref 3–11)
BUN: 21.1 mg/dL (ref 7.0–26.0)
CHLORIDE: 102 meq/L (ref 98–109)
CO2: 24 meq/L (ref 22–29)
Calcium: 9.1 mg/dL (ref 8.4–10.4)
Creatinine: 1.1 mg/dL (ref 0.7–1.3)
EGFR: >60 mL/min/{1.73_m2} (ref >60)
Glucose: 116 mg/dl (ref 70–140)
POTASSIUM: 4.4 meq/L (ref 3.5–5.1)
SODIUM: 136 meq/L (ref 136–145)
Total Bilirubin: 0.67 mg/dL (ref 0.20–1.20)
Total Protein: 6.6 g/dL (ref 6.4–8.3)

## 2017-04-04 LAB — CBC WITH DIFFERENTIAL/PLATELET
BASO%: 0.4 % (ref 0.0–2.0)
Basophils Absolute: 0 10*3/uL (ref 0.0–0.1)
EOS ABS: 0 10*3/uL (ref 0.0–0.5)
EOS%: 1.3 % (ref 0.0–7.0)
HCT: 41.3 % (ref 38.4–49.9)
HEMOGLOBIN: 14.2 g/dL (ref 13.0–17.1)
LYMPH%: 35.1 % (ref 14.0–49.0)
MCH: 32.4 pg (ref 27.2–33.4)
MCHC: 34.3 g/dL (ref 32.0–36.0)
MCV: 94.5 fL (ref 79.3–98.0)
MONO#: 0.3 10*3/uL (ref 0.1–0.9)
MONO%: 11.3 % (ref 0.0–14.0)
NEUT%: 51.9 % (ref 39.0–75.0)
NEUTROS ABS: 1.3 10*3/uL — AB (ref 1.5–6.5)
PLATELETS: 119 10*3/uL — AB (ref 140–400)
RBC: 4.37 10*6/uL (ref 4.20–5.82)
RDW: 14.6 % (ref 11.0–14.6)
WBC: 2.6 10*3/uL — ABNORMAL LOW (ref 4.0–10.3)
lymph#: 0.9 10*3/uL (ref 0.9–3.3)

## 2017-04-04 MED ORDER — DOXYCYCLINE HYCLATE 100 MG PO TABS: 100.0000 mg | ORAL_TABLET | Freq: Two times a day (BID) | ORAL | 0 refills | Status: DC

## 2017-04-04 MED ORDER — PREDNISONE 5 MG PO TABS: ORAL_TABLET | ORAL | 0 refills | Status: DC

## 2017-04-04 MED ORDER — HYDROCOD POLST-CPM POLST ER 10-8 MG/5ML PO SUER: 5.0000 mL | Freq: Two times a day (BID) | ORAL | 0 refills | Status: DC | PRN

## 2017-04-04 NOTE — Telephone Encounter (Signed)
'  rattling cough, low grade fever 99.9-100.8 , not feeling well". Carbo reaction 12/27. Per Julien Nordmann appat with [facility] today after labs.

## 2017-04-04 NOTE — Progress Notes (Signed)
Wife notified to take pt to radiology an hour before appt.

## 2017-04-05 NOTE — Progress Notes (Signed)
[facility]pt[facility] Progress Note   GUS LITTLER [id number] 10-19-1950 67 y.o.  [name] is managed by Dr. Eilleen Kempf  Actively treated with chemotherapy: yes  Current Therapy: Carboplatin, Alimta, and Keytruda  Last Treated:  03/28/2017  Assessment: Plan:    Cough - Plan: chlorpheniramine-HYDROcodone (TUSSIONEX) 10-8 MG/5ML SUER, predniSONE (DELTASONE) 5 MG tablet  Fever, unspecified fever cause - Plan: doxycycline (VIBRA-TABS) 100 MG tablet  Chronic obstructive pulmonary disease with acute exacerbation ([place]) - Plan: doxycycline (VIBRA-TABS) 100 MG tablet, predniSONE (DELTASONE) 5 MG tablet   Cough: Patient was given a prescription for Tussionex  COPD with acute exacerbation with fevers: Patient was given a prescription for doxycycline 100 mg p.o. twice daily times 7 days.  He was additionally given a 6-day prednisone taper.  Please see After Visit Summary for patient specific instructions.  Future Appointments  Date Time Provider [place]  04/11/2017 11:30 AM [facility] 5 [facility] None  04/18/2017  8:15 AM [facility] 2 [facility] None  04/18/2017  8:45 AM Curt Bears, MD [facility] None  04/18/2017  9:30 AM [facility] H28 [facility] None  04/25/2017 11:30 AM [facility] 4 [facility] None  05/02/2017 11:30 AM [facility] 3 [facility] None  05/09/2017  8:15 AM [facility] 4 [facility] None  05/09/2017  8:45 AM Curt Bears, MD [facility] None  05/09/2017  9:30 AM [facility] G22 [facility] None  05/16/2017 11:30 AM [facility] LAB 2 [facility] None  05/23/2017 11:30 AM [facility] 2 [facility] None  05/30/2017  9:15 AM [facility] LAB 3 [facility] None  05/30/2017  9:45 AM Curt Bears, MD [facility] None  05/30/2017 11:00 AM [facility] G24 [facility] None    No orders of the defined types were placed in this encounter.      Subjective:   Patient ID:  ATTIKUS BARTOSZEK is a 67 y.o.  (DOB 1950/12/31) male.  Chief Complaint:  Chief Complaint  Patient presents with  . Shortness of Breath    HPI ABDIEL BLACKERBY is a 67 year old male with a diagnosis of a metastatic non-small cell lung cancer.  He was initially diagnosed as a stage IIIa (T2a, and 2, M0) non-small cell lung cancer, poorly differentiated adenocarcinoma.  He initially presented with a left lower lobe mass and mediastinal lymphadenopathy.  He was diagnosed with metastatic disease when he was found to have involvement of disease in his left femur, left supraclavicular lymph node, and right paratracheal lymphadenopathy.  He was last seen by this author on 03/28/2017 when he had a reaction to carboplatin.  He presents today stating that he is not feel well since his chemotherapy reaction on 12/27.  He has had nausea, vomiting, and diarrhea for the past 3-4 days.  He has had increasing shortness of breath with a cough and a fever of 100.5 to 100.6 along with chills, night sweats, and chest pain with deep breathing.  He has a history of COPD.  His cough is productive but is producing clear secretions at this time.  He has not been out of bed for the past 3 days.  He has a severe headache with coughing.  He denies visual changes or weakness.  He does report some dizziness states that he has no energy.  Medications: I have reviewed the patient's current medications.  Allergies:  Allergies  Allergen Reactions  . Carboplatin Itching, Nausea And Vomiting and Other (See Comments)    Flushing  . Flecainide Hypertension    CAUSED HEART ISSUES  Past Medical History:  Diagnosis Date  . Adenocarcinoma of left lung, stage 3 ([place]) 08/15/2016  . Arthritis   . Atrial fibrillation ([place]) 08/15/2016  . Atrial fibrillation ([place])   . COPD (chronic obstructive pulmonary disease) ([place]) 08/15/2016  . History of chemotherapy   . History of radiation therapy   . Hyperlipidemia   . Hypothyroid 08/15/2016  . Longstanding persistent  atrial fibrillation ([place]) 08/29/2016  . Pathologic fracture    left femur  . Pneumonitis   . S/P TURP 08/15/2016  . Wears glasses   . Wears hearing aid in both ears     Past Surgical History:  Procedure Laterality Date  . BRONCHOSCOPY  10/2014  . CARDIAC CATHETERIZATION     05/07/12  . CARDIOVERSION     x2  . COLONOSCOPY    . DG BIOPSY LUNG Left 10/2014   FNA - Adenocarcinoma   . FEMUR IM NAIL Left 02/19/2017  . FEMUR IM NAIL Left 02/19/2017   Procedure: INTRAMEDULLARY (IM) NAIL FEMORAL;  Surgeon: Marchia Bond, MD;  Location: [facility];  Service: Orthopedics;  Laterality: Left;  . LUNG CANCER SURGERY Left 12/2014   Wedge Resection   . MULTIPLE TOOTH EXTRACTIONS    . Status post TURP    . TONSILLECTOMY      Family History  Problem Relation Age of Onset  . Breast cancer Mother   . Breast cancer Maternal Aunt   . Breast cancer Maternal Aunt   . Lung disease Neg Hx     Social History   Socioeconomic History  . Marital status: Married    Spouse name: Not on file  . Number of children: Not on file  . Years of education: Not on file  . Highest education level: Not on file  Social Needs  . Financial resource strain: Not on file  . Food insecurity - worry: Not on file  . Food insecurity - inability: Not on file  . Transportation needs - medical: Not on file  . Transportation needs - non-medical: Not on file  Occupational History  . Not on file  Tobacco Use  . Smoking status: Former Smoker    Packs/day: 1.00    Years: 50.00    Pack years: 50.00    Types: Cigarettes    Last attempt to quit: 08/15/2012    Years since quitting: 4.6  . Smokeless tobacco: Never Used  Substance and Sexual Activity  . Alcohol use: No  . Drug use: No  . Sexual activity: Not on file  Other Topics Concern  . Not on file  Social History Narrative   [facility] (09/26/16):   Originally from [place]. Moved to [facility] February 2018. Always lived in [place]. Moved to be closer to children &  grandchildren. No international travel. 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 No mold exposure. No bird exposure or hot tub exposure. Enjoys reading. Previously enjoyed wood working with domestic woods.     Past Medical History, Surgical history, Social history, and Family history were reviewed and updated as appropriate.   Please see review of systems for further details on the patient's review from today.   Review of Systems:  Review of Systems  Constitutional: Positive for activity change, appetite change, chills, diaphoresis, fatigue and fever.  HENT: Negative for congestion, postnasal drip, rhinorrhea, sinus pressure, sinus pain and trouble swallowing.   Eyes: Negative for visual disturbance.  Respiratory: Positive for cough and shortness of  breath. Negative for choking, chest tightness and wheezing.   Cardiovascular: Negative for chest pain, palpitations and leg swelling.  Gastrointestinal: Positive for diarrhea, nausea and vomiting. Negative for constipation.  Neurological: Positive for headaches.    Objective:   Physical Exam:  BP (!) 125/58 (BP Location: Right Arm, Patient Position: Sitting)   Pulse (!) 130 Comment: RN [name] aware of BP  Temp 97.8 F (36.6 C) (Oral)   Resp (!) 24 Comment: RN [name] aware of resp.  Ht 6\' 1"  (1.854 m)   Wt 250 lb 9.6 oz (113.7 kg)   SpO2 99%   BMI 33.06 kg/m  ECOG: 1  Physical Exam  Constitutional: No distress.  HENT:  Head: Normocephalic and atraumatic.  Right Ear: External ear normal.  Left Ear: External ear normal.  Mouth/Throat: Oropharynx is clear and moist. No oropharyngeal exudate.  Eyes: Right eye exhibits no discharge. Left eye exhibits no discharge. No scleral icterus.  Neck: Normal range of motion. Neck supple.  Cardiovascular: S1 normal and S2 normal. An irregularly irregular rhythm present. Tachycardia present.  Pulmonary/Chest: Effort normal and  breath sounds normal. No respiratory distress. He has no wheezes. He exhibits no tenderness.  Musculoskeletal: Normal range of motion.  Lymphadenopathy:    He has no cervical adenopathy.  Neurological: He is alert. He has normal strength. Coordination normal.  Skin: Skin is warm and dry. No rash noted. He is not diaphoretic. No erythema. No pallor.  Psychiatric: He has a normal mood and affect. His behavior is normal. Judgment and thought content normal.    Lab Review:     Component Value Date/Time   NA 136 04/04/2017 1141   K 4.4 04/04/2017 1141   CL 105 02/20/2017 0722   CO2 24 04/04/2017 1141   GLUCOSE 116 04/04/2017 1141   BUN 21.1 04/04/2017 1141   CREATININE 1.1 04/04/2017 1141   CALCIUM 9.1 04/04/2017 1141   PROT 6.6 04/04/2017 1141   ALBUMIN 3.5 04/04/2017 1141   AST 17 04/04/2017 1141   ALT 18 04/04/2017 1141   ALKPHOS 89 04/04/2017 1141   BILITOT 0.67 04/04/2017 1141   GFRNONAA >60 02/20/2017 0722   GFRAA >60 02/20/2017 0722       Component Value Date/Time   WBC 2.6 (L) 04/04/2017 1141   WBC 6.5 02/20/2017 0722   RBC 4.37 04/04/2017 1141   RBC 3.84 (L) 02/20/2017 0722   HGB 14.2 04/04/2017 1141   HCT 41.3 04/04/2017 1141   PLT 119 (L) 04/04/2017 1141   MCV 94.5 04/04/2017 1141   MCH 32.4 04/04/2017 1141   MCH 32.8 02/20/2017 0722   MCHC 34.3 04/04/2017 1141   MCHC 34.0 02/20/2017 0722   RDW 14.6 04/04/2017 1141   LYMPHSABS 0.9 04/04/2017 1141   MONOABS 0.3 04/04/2017 1141   EOSABS 0.0 04/04/2017 1141   BASOSABS 0.0 04/04/2017 1141   -------------------------------  Imaging from last 24 hours (if applicable):  Radiology interpretation: Dg Chest 1 View  Result Date: 04/04/2017 CLINICAL DATA:  Left lung cancer.  Cough.  Fever. EXAM: CHEST 1 VIEW COMPARISON:  03/23/2015 chest radiograph. FINDINGS: Stable cardiomediastinal silhouette with normal heart size. No pneumothorax. No pleural effusion. No pulmonary edema. Patchy left parahilar lung opacity appears  stable since 08/30/2016 chest CT. No acute consolidative airspace disease. IMPRESSION: No acute cardiopulmonary disease. Stable patchy left parahilar lung opacity compatible with post treatment change. Electronically Signed   By: Ilona Sorrel M.D.   On: 04/04/2017 12:37

## 2017-04-11 ENCOUNTER — Inpatient Hospital Stay: Payer: Medicare Other | Attending: Internal Medicine

## 2017-04-11 DIAGNOSIS — C3432 Malignant neoplasm of lower lobe, left bronchus or lung: Secondary | ICD-10-CM | POA: Insufficient documentation

## 2017-04-11 DIAGNOSIS — J449 Chronic obstructive pulmonary disease, unspecified: Secondary | ICD-10-CM | POA: Insufficient documentation

## 2017-04-11 DIAGNOSIS — Z923 Personal history of irradiation: Secondary | ICD-10-CM | POA: Insufficient documentation

## 2017-04-11 DIAGNOSIS — E039 Hypothyroidism, unspecified: Secondary | ICD-10-CM | POA: Diagnosis not present

## 2017-04-11 DIAGNOSIS — Z5111 Encounter for antineoplastic chemotherapy: Secondary | ICD-10-CM | POA: Diagnosis present

## 2017-04-11 DIAGNOSIS — C7951 Secondary malignant neoplasm of bone: Secondary | ICD-10-CM | POA: Diagnosis not present

## 2017-04-11 DIAGNOSIS — I481 Persistent atrial fibrillation: Secondary | ICD-10-CM | POA: Insufficient documentation

## 2017-04-11 DIAGNOSIS — C781 Secondary malignant neoplasm of mediastinum: Secondary | ICD-10-CM | POA: Insufficient documentation

## 2017-04-11 DIAGNOSIS — C3492 Malignant neoplasm of unspecified part of left bronchus or lung: Secondary | ICD-10-CM

## 2017-04-11 DIAGNOSIS — Z79899 Other long term (current) drug therapy: Secondary | ICD-10-CM | POA: Insufficient documentation

## 2017-04-11 DIAGNOSIS — C770 Secondary and unspecified malignant neoplasm of lymph nodes of head, face and neck: Secondary | ICD-10-CM | POA: Insufficient documentation

## 2017-04-11 DIAGNOSIS — E785 Hyperlipidemia, unspecified: Secondary | ICD-10-CM | POA: Insufficient documentation

## 2017-04-11 DIAGNOSIS — Z902 Acquired absence of lung [part of]: Secondary | ICD-10-CM | POA: Diagnosis not present

## 2017-04-11 LAB — COMPREHENSIVE METABOLIC PANEL
ALK PHOS: 75 U/L (ref 40–150)
ALT: 29 U/L (ref 0–55)
ANION GAP: 7 (ref 3–11)
AST: 26 U/L (ref 5–34)
Albumin: 3.5 g/dL (ref 3.5–5.0)
BILIRUBIN TOTAL: 0.5 mg/dL (ref 0.2–1.2)
BUN: 16 mg/dL (ref 7–26)
CALCIUM: 9.2 mg/dL (ref 8.4–10.4)
CO2: 30 mmol/L — AB (ref 22–29)
CREATININE: 1.06 mg/dL (ref 0.70–1.30)
Chloride: 104 mmol/L (ref 98–109)
GFR calc non Af Amer: >60 mL/min (ref >60)
Glucose, Bld: 95 mg/dL (ref 70–140)
Potassium: 4.3 mmol/L (ref 3.5–5.1)
SODIUM: 141 mmol/L (ref 136–145)
Total Protein: 6.5 g/dL (ref 6.4–8.3)

## 2017-04-11 LAB — CBC WITH DIFFERENTIAL/PLATELET
BASOS ABS: 0 10*3/uL (ref 0.0–0.1)
BASOS PCT: 0 %
EOS ABS: 0.1 10*3/uL (ref 0.0–0.5)
Eosinophils Relative: 1 %
HCT: 39.1 % (ref 38.4–49.9)
HEMOGLOBIN: 13.5 g/dL (ref 13.0–17.1)
Lymphocytes Relative: 28 %
Lymphs Abs: 1.3 10*3/uL (ref 0.9–3.3)
MCH: 33 pg (ref 27.2–33.4)
MCHC: 34.5 g/dL (ref 32.0–36.0)
MCV: 95.5 fL (ref 79.3–98.0)
MONO ABS: 0.5 10*3/uL (ref 0.1–0.9)
MONOS PCT: 10 %
NEUTROS ABS: 2.7 10*3/uL (ref 1.5–6.5)
NEUTROS PCT: 61 %
Platelets: 184 10*3/uL (ref 140–400)
RBC: 4.1 MIL/uL — ABNORMAL LOW (ref 4.20–5.82)
RDW: 15.4 % (ref 11.0–15.6)
WBC: 4.6 10*3/uL (ref 4.0–10.3)

## 2017-04-18 ENCOUNTER — Encounter: Payer: Self-pay | Admitting: Internal Medicine

## 2017-04-18 ENCOUNTER — Inpatient Hospital Stay: Payer: Medicare Other

## 2017-04-18 ENCOUNTER — Inpatient Hospital Stay (HOSPITAL_BASED_OUTPATIENT_CLINIC_OR_DEPARTMENT_OTHER): Payer: Medicare Other | Admitting: Internal Medicine

## 2017-04-18 VITALS — BP 132/69 | HR 97 | Temp 97.7°F | Resp 17 | Ht 73.0 in | Wt 245.3 lb

## 2017-04-18 DIAGNOSIS — C7951 Secondary malignant neoplasm of bone: Secondary | ICD-10-CM

## 2017-04-18 DIAGNOSIS — R5382 Chronic fatigue, unspecified: Secondary | ICD-10-CM

## 2017-04-18 DIAGNOSIS — Z79899 Other long term (current) drug therapy: Secondary | ICD-10-CM

## 2017-04-18 DIAGNOSIS — Z5111 Encounter for antineoplastic chemotherapy: Secondary | ICD-10-CM | POA: Diagnosis not present

## 2017-04-18 DIAGNOSIS — C3492 Malignant neoplasm of unspecified part of left bronchus or lung: Secondary | ICD-10-CM

## 2017-04-18 DIAGNOSIS — C3432 Malignant neoplasm of lower lobe, left bronchus or lung: Secondary | ICD-10-CM | POA: Diagnosis not present

## 2017-04-18 DIAGNOSIS — Z923 Personal history of irradiation: Secondary | ICD-10-CM

## 2017-04-18 DIAGNOSIS — C781 Secondary malignant neoplasm of mediastinum: Secondary | ICD-10-CM | POA: Diagnosis not present

## 2017-04-18 DIAGNOSIS — Z5112 Encounter for antineoplastic immunotherapy: Secondary | ICD-10-CM

## 2017-04-18 DIAGNOSIS — C3490 Malignant neoplasm of unspecified part of unspecified bronchus or lung: Secondary | ICD-10-CM

## 2017-04-18 DIAGNOSIS — C770 Secondary and unspecified malignant neoplasm of lymph nodes of head, face and neck: Secondary | ICD-10-CM | POA: Diagnosis not present

## 2017-04-18 LAB — CBC WITH DIFFERENTIAL/PLATELET
BASOS ABS: 0 10*3/uL (ref 0.0–0.1)
BASOS PCT: 0 %
Eosinophils Absolute: 0 10*3/uL (ref 0.0–0.5)
Eosinophils Relative: 0 %
HEMATOCRIT: 40.6 % (ref 38.4–49.9)
HEMOGLOBIN: 14 g/dL (ref 13.0–17.1)
Lymphocytes Relative: 16 %
Lymphs Abs: 1.3 10*3/uL (ref 0.9–3.3)
MCH: 32.8 pg (ref 27.2–33.4)
MCHC: 34.5 g/dL (ref 32.0–36.0)
MCV: 95.1 fL (ref 79.3–98.0)
MONOS PCT: 7 %
Monocytes Absolute: 0.6 10*3/uL (ref 0.1–0.9)
NEUTROS ABS: 6.6 10*3/uL — AB (ref 1.5–6.5)
NEUTROS PCT: 77 %
Platelets: 304 10*3/uL (ref 140–400)
RBC: 4.27 MIL/uL (ref 4.20–5.82)
RDW: 15.7 % — ABNORMAL HIGH (ref 11.0–15.6)
WBC: 8.6 10*3/uL (ref 4.0–10.3)

## 2017-04-18 LAB — COMPREHENSIVE METABOLIC PANEL
ALK PHOS: 77 U/L (ref 40–150)
ALT: 23 U/L (ref 0–55)
ANION GAP: 11 (ref 3–11)
AST: 26 U/L (ref 5–34)
Albumin: 3.6 g/dL (ref 3.5–5.0)
BUN: 18 mg/dL (ref 7–26)
CALCIUM: 9.5 mg/dL (ref 8.4–10.4)
CO2: 20 mmol/L — AB (ref 22–29)
Chloride: 106 mmol/L (ref 98–109)
Creatinine, Ser: 0.85 mg/dL (ref 0.70–1.30)
Glucose, Bld: 139 mg/dL (ref 70–140)
Potassium: 4.5 mmol/L (ref 3.5–5.1)
SODIUM: 137 mmol/L (ref 136–145)
TOTAL PROTEIN: 7 g/dL (ref 6.4–8.3)
Total Bilirubin: 0.5 mg/dL (ref 0.2–1.2)

## 2017-04-18 LAB — TSH: TSH: 2.072 u[IU]/mL (ref 0.320–4.118)

## 2017-04-18 MED ORDER — PROCHLORPERAZINE MALEATE 10 MG PO TABS
10.0000 mg | ORAL_TABLET | Freq: Once | ORAL | Status: AC
  Administered 2017-04-18: 10 mg via ORAL

## 2017-04-18 MED ORDER — SODIUM CHLORIDE 0.9 % IV SOLN: Freq: Once | INTRAVENOUS | Status: DC

## 2017-04-18 MED ORDER — PALONOSETRON HCL INJECTION 0.25 MG/5ML: 0.2500 mg | Freq: Once | INTRAVENOUS | Status: DC

## 2017-04-18 MED ORDER — SODIUM CHLORIDE 0.9 % IV SOLN
Freq: Once | INTRAVENOUS | Status: AC
  Administered 2017-04-18: 10:00:00 via INTRAVENOUS

## 2017-04-18 MED ORDER — SODIUM CHLORIDE 0.9 % IV SOLN
200.0000 mg | Freq: Once | INTRAVENOUS | Status: AC
  Administered 2017-04-18: 200 mg via INTRAVENOUS
  Filled 2017-04-18: qty 8

## 2017-04-18 MED ORDER — SODIUM CHLORIDE 0.9 % IV SOLN
1200.0000 mg | Freq: Once | INTRAVENOUS | Status: AC
  Administered 2017-04-18: 1200 mg via INTRAVENOUS
  Filled 2017-04-18: qty 40

## 2017-04-18 NOTE — Patient Instructions (Signed)
[facility] Discharge Instructions for Patients Receiving Chemotherapy  Today you received the following chemotherapy agents Keytruda and Altima.   To help prevent nausea and vomiting after your treatment, we encourage you to take your nausea medication as directed.   If you develop nausea and vomiting that is not controlled by your nausea medication, call the clinic.   BELOW ARE SYMPTOMS THAT SHOULD BE REPORTED IMMEDIATELY:  *FEVER GREATER THAN 100.5 F  *CHILLS WITH OR WITHOUT FEVER  NAUSEA AND VOMITING THAT IS NOT CONTROLLED WITH YOUR NAUSEA MEDICATION  *UNUSUAL SHORTNESS OF BREATH  *UNUSUAL BRUISING OR BLEEDING  TENDERNESS IN MOUTH AND THROAT WITH OR WITHOUT PRESENCE OF ULCERS  *URINARY PROBLEMS  *BOWEL PROBLEMS  UNUSUAL RASH Items with * indicate a potential emergency and should be followed up as soon as possible.  Feel free to call the clinic should you have any questions or concerns. The clinic phone number is [phone].  Please show the [facility] at check-in to the Emergency Department and triage nurse.

## 2017-04-18 NOTE — Progress Notes (Signed)
[facility] Telephone:([phone]   Fax:([phone]) [phone]  OFFICE PROGRESS NOTE  [name], MD [address] [place] [address]  DIAGNOSIS: Metastatic non-small cell lung cancer initially diagnosed as stage IIIA (T2a, N2, M0) non-small cell lung cancer, poorly differentiated adenocarcinoma presented with left lower lobe lung mass in addition to mediastinal lymphadenopathy.  The patient was diagnosed with metastatic disease involving the left femur as well as left supraclavicular nodal metastases and right paratracheal lymphadenopathy in October 2018.  Biomarker Findings Microsatellite Status - MS-Stable Tumor Mutational Burden - TMB-Low (3 Muts/Mb) Genomic Findings For a complete list of the genes assayed, please refer to the Appendix. STK11 P267f*6 [id number] site 1932+3F>TDAXX E374* MLL2 [id number] NBN K23336f NOTCH2 [id number] splice site 988542+7C>WDisease relevant genes with no reportable alterations: EGFR, KRAS, ALK, BRAF, MET, RET, ERBB2, ROS1   PDL1 expression 5%  PRIOR THERAPY: 1) status post wedge resection of the left lower lobe lung mass as well as AP window lymph node dissection but there was residual metastatic mediastinal lymphadenopathy that could not be resected. 2) a course of concurrent chemorad[place]  3) status post palliative radiotherapy to the left femur metastatic bone disease.   CURRENT THERAPY: Systemic chemotherapy with carboplatin for AC of 5, Alimta 500 mg/M2 and Keytruda 200 mg IV every 3 weeks.  First dose March 07, 2017.  Carboplatin was discontinued during cycle #2 secondary to hypersensitivity reaction.  The patient is currently on treatment with pemetrexed and Keytruda.  Status post 2 cycles.  INTERVAL HISTORY: Victor Huber. male returns to the clinic today for follow-up visit accompanied by his son.  The patient  is doing fine today with no specific complaints except for fatigue and occasional diarrhea.  He had hypersensitivity reaction to carboplatin during cycle #2 and this was discontinued.  He denied having any chest pain, shortness of breath, cough or hemoptysis.  He denied having any fever or chills.  He has no nausea, vomiting or constipation.  He is here today for evaluation before starting cycle #3.  MEDICAL HISTORY: Past Medical History:  Diagnosis Date  . Adenocarcinoma of left lung, stage 3 ([facility]/16/2018  . Arthritis   . Atrial fibrillation ([facility]/16/2018  . Atrial fibrillation ([facility] . COPD (chronic obstructive pulmonary disease) ([facility]/16/2018  . History of chemotherapy   . History of radiation therapy   . Hyperlipidemia   . Hypothyroid 08/15/2016  . Longstanding persistent atrial fibrillation ([facility]/30/2018  . Pathologic fracture    left femur  . Pneumonitis   . S/P TURP 08/15/2016  . Wears glasses   . Wears hearing aid in both ears     ALLERGIES:  is allergic to carboplatin and flecainide.  MEDICATIONS:  Current Outpatient Medications  Medication Sig Dispense Refill  . apixaban (ELIQUIS) 5 MG TABS tablet Take 5 mg by mouth 2 (two) times daily.    . aMarland Kitchenenolol (TENORMIN) 50 MG tablet Take 50 mg by mouth daily.     . aMarland Kitchenorvastatin (LIPITOR) 40 MG tablet Take 40 mg by mouth daily.    . baclofen (LIORESAL) 10 MG tablet Take 1 tablet (10 mg total) by mouth 3 (three) times daily. As needed for muscle spasm 50 tablet 0  . chlorpheniramine-HYDROcodone (TUSSIONEX) 10-8 MG/5ML SUER Take 5 mLs by mouth every 12 (twelve) hours as needed for cough. 345 mL 0  . citalopram (  CELEXA) 40 MG tablet Take 40 mg by mouth daily.    Marland Kitchen dexamethasone (DECADRON) 4 MG tablet 4 mg p.o. twice daily the day before, day of and day after chemotherapy every 3 weeks 40 tablet 1  . doxycycline (VIBRA-TABS) 100 MG tablet Take 1 tablet (100 mg total) by mouth 2 (two) times daily. 14 tablet 0  . folic acid  (FOLVITE) 1 MG tablet Take 1 tablet (1 mg total) by mouth daily. 30 tablet 4  . ibuprofen (ADVIL,MOTRIN) 400 MG tablet Take 400 mg by mouth every 4 (four) hours as needed for moderate pain.    Marland Kitchen levothyroxine (SYNTHROID, LEVOTHROID) 200 MCG tablet Take 200 mcg by mouth daily before breakfast.    . levothyroxine (SYNTHROID, LEVOTHROID) 25 MCG tablet Take 25 mcg by mouth daily.    Marland Kitchen LORazepam (ATIVAN) 0.5 MG tablet Take 0.5 mg daily as needed by mouth.     . ondansetron (ZOFRAN) 4 MG tablet Take 1 tablet (4 mg total) by mouth every 8 (eight) hours as needed for nausea or vomiting. 30 tablet 0  . oxyCODONE (ROXICODONE) 5 MG immediate release tablet Take 1-2 tablets (5-10 mg total) by mouth every 4 (four) hours as needed for severe pain. 50 tablet 0  . predniSONE (DELTASONE) 5 MG tablet 6 tab x 1 day, 5 tab x 1 day, 4 tab x 1 day, 3 tab X 1 day, 2 tab x 1 day, 1 tab x 1 day 21 tablet 0  . prochlorperazine (COMPAZINE) 10 MG tablet Take 1 tablet (10 mg total) by mouth every 6 (six) hours as needed for nausea or vomiting. 30 tablet 0  . sennosides-docusate sodium (SENOKOT-S) 8.6-50 MG tablet Take 2 tablets by mouth daily. 30 tablet 1  . tamsulosin (FLOMAX) 0.4 MG CAPS capsule Take 0.4 mg daily by mouth.     . zolpidem (AMBIEN) 5 MG tablet Take 5 mg by mouth at bedtime as needed for sleep.     No current facility-administered medications for this visit.     SURGICAL HISTORY:  Past Surgical History:  Procedure Laterality Date  . BRONCHOSCOPY  10/2014  . CARDIAC CATHETERIZATION     05/07/12  . CARDIOVERSION     x2  . COLONOSCOPY    . DG BIOPSY LUNG Left 10/2014   FNA - Adenocarcinoma   . FEMUR IM NAIL Left 02/19/2017  . FEMUR IM NAIL Left 02/19/2017   Procedure: INTRAMEDULLARY (IM) NAIL FEMORAL;  Surgeon: Marchia Bond, MD;  Location: [facility];  Service: Orthopedics;  Laterality: Left;  . LUNG CANCER SURGERY Left 12/2014   Wedge Resection   . MULTIPLE TOOTH EXTRACTIONS    . Status post TURP    .  TONSILLECTOMY      REVIEW OF SYSTEMS:  A comprehensive review of systems was negative except for: Constitutional: positive for fatigue Gastrointestinal: positive for diarrhea   PHYSICAL EXAMINATION: General appearance: alert, cooperative, fatigued and no distress Head: Normocephalic, without obvious abnormality, atraumatic Neck: no adenopathy, no JVD, supple, symmetrical, trachea midline and thyroid not enlarged, symmetric, no tenderness/mass/nodules Lymph nodes: Cervical, supraclavicular, and axillary nodes normal. Resp: clear to auscultation bilaterally Back: symmetric, no curvature. ROM normal. No CVA tenderness. Cardio: regular rate and rhythm, S1, S2 normal, no murmur, click, rub or gallop GI: soft, non-tender; bowel sounds normal; no masses,  no organomegaly Extremities: extremities normal, atraumatic, no cyanosis or edema  ECOG PERFORMANCE STATUS: 1 - Symptomatic but completely ambulatory  Blood pressure 132/69, pulse 97, temperature 97.7 F (36.5  C), temperature source Oral, resp. rate 17, height _0  (1.854 m), weight 245 lb 4.8 oz (111.3 kg), SpO2 97 %.  LABORATORY DATA: Lab Results  Component Value Date   WBC 8.6 04/18/2017   HGB 14.0 04/18/2017   HCT 40.6 04/18/2017   MCV 95.1 04/18/2017   PLT 304 04/18/2017      Chemistry      Component Value Date/Time   NA 141 04/11/2017 1214   NA 136 04/04/2017 1141   K 4.3 04/11/2017 1214   K 4.4 04/04/2017 1141   CL 104 04/11/2017 1214   CO2 30 (H) 04/11/2017 1214   CO2 24 04/04/2017 1141   BUN 16 04/11/2017 1214   BUN 21.1 04/04/2017 1141   CREATININE 1.06 04/11/2017 1214   CREATININE 1.1 04/04/2017 1141      Component Value Date/Time   CALCIUM 9.2 04/11/2017 1214   CALCIUM 9.1 04/04/2017 1141   ALKPHOS 75 04/11/2017 1214   ALKPHOS 89 04/04/2017 1141   AST 26 04/11/2017 1214   AST 17 04/04/2017 1141   ALT 29 04/11/2017 1214   ALT 18 04/04/2017 1141   BILITOT 0.5 04/11/2017 1214   BILITOT 0.67 04/04/2017 1141         RADIOGRAPHIC STUDIES: Dg Chest 1 View  Result Date: 04/04/2017 CLINICAL DATA:  Left lung cancer.  Cough.  Fever. EXAM: CHEST 1 VIEW COMPARISON:  03/23/2015 chest radiograph. FINDINGS: Stable cardiomediastinal silhouette with normal heart size. No pneumothorax. No pleural effusion. No pulmonary edema. Patchy left parahilar lung opacity appears stable since 08/30/2016 chest CT. No acute consolidative airspace disease. IMPRESSION: No acute cardiopulmonary disease. Stable patchy left parahilar lung opacity compatible with post treatment change. Electronically Signed   By: Ilona Sorrel M.D.   On: 04/04/2017 12:37    ASSESSMENT AND PLAN: This is a 67 years old white male with metastatic non-small cell lung cancer, adenocarcinoma with no actionable mutations and PDL 1 expression of 5% that was initially diagnosed as stage IIIa non-small cell lung cancer, adenocarcinoma status post left lower lobectomy with lymph node dissection followed by a course of concurrent chemoradiation completed in January 2016.  The patient had evidence for disease metastasis in October 2018 with metastatic disease to the left femur as well as left supraclavicular and right paratracheal lymph nodes. The patient is currently undergoing palliative systemic chemotherapy with Alimta and Keytruda after he developed hypersensitivity reaction to carboplatin on cycle #2. I recommended for him to proceed with cycle #3 today as a scheduled. I will see him back for follow-up visit in 3 weeks for evaluation after repeating CT scan of the chest, abdomen and pelvis as well as CT of the left femur for restaging of his disease. He was advised to call immediately if he has any concerning symptoms in the interval. The patient voices understanding of current disease status and treatment options and is in agreement with the current care plan. All questions were answered. The patient knows to call the clinic with any problems, questions or  concerns. We can certainly see the patient much sooner if necessary.  Disclaimer: This note was dictated with voice recognition software. Similar sounding words can inadvertently be transcribed and may not be corrected upon review.

## 2017-04-25 ENCOUNTER — Other Ambulatory Visit: Payer: Medicare Other

## 2017-05-02 ENCOUNTER — Other Ambulatory Visit: Payer: Medicare Other

## 2017-05-07 ENCOUNTER — Encounter: Payer: Self-pay | Admitting: Pharmacist

## 2017-05-07 ENCOUNTER — Ambulatory Visit (HOSPITAL_COMMUNITY)
Admission: RE | Admit: 2017-05-07 | Discharge: 2017-05-07 | Disposition: A | Discharge status: Home / Self Care | Payer: Medicare HMO | Source: Ambulatory Visit | Attending: Internal Medicine | Admitting: Internal Medicine

## 2017-05-07 DIAGNOSIS — I700 Atherosclerosis of aorta: Secondary | ICD-10-CM | POA: Insufficient documentation

## 2017-05-07 DIAGNOSIS — K8020 Calculus of gallbladder without cholecystitis without obstruction: Secondary | ICD-10-CM | POA: Diagnosis not present

## 2017-05-07 DIAGNOSIS — I2510 Atherosclerotic heart disease of native coronary artery without angina pectoris: Secondary | ICD-10-CM | POA: Insufficient documentation

## 2017-05-07 DIAGNOSIS — J439 Emphysema, unspecified: Secondary | ICD-10-CM | POA: Diagnosis not present

## 2017-05-07 DIAGNOSIS — C3490 Malignant neoplasm of unspecified part of unspecified bronchus or lung: Secondary | ICD-10-CM

## 2017-05-07 DIAGNOSIS — C7951 Secondary malignant neoplasm of bone: Secondary | ICD-10-CM | POA: Diagnosis not present

## 2017-05-07 MED ORDER — IOPAMIDOL (ISOVUE-300) INJECTION 61%
INTRAVENOUS | Status: AC
  Filled 2017-05-07: qty 100

## 2017-05-07 MED ORDER — IOPAMIDOL (ISOVUE-300) INJECTION 61%
100.0000 mL | Freq: Once | INTRAVENOUS | Status: AC | PRN
  Administered 2017-05-07: 100 mL via INTRAVENOUS

## 2017-05-09 ENCOUNTER — Encounter: Payer: Self-pay | Admitting: Internal Medicine

## 2017-05-09 ENCOUNTER — Telehealth: Payer: Self-pay | Admitting: Internal Medicine

## 2017-05-09 ENCOUNTER — Inpatient Hospital Stay: Payer: Medicare HMO | Attending: Internal Medicine | Admitting: Internal Medicine

## 2017-05-09 ENCOUNTER — Inpatient Hospital Stay: Payer: Medicare HMO

## 2017-05-09 VITALS — BP 95/56 | HR 91 | Temp 97.8°F | Resp 18 | Ht 73.0 in | Wt 238.9 lb

## 2017-05-09 DIAGNOSIS — Z79899 Other long term (current) drug therapy: Secondary | ICD-10-CM | POA: Insufficient documentation

## 2017-05-09 DIAGNOSIS — M1990 Unspecified osteoarthritis, unspecified site: Secondary | ICD-10-CM | POA: Insufficient documentation

## 2017-05-09 DIAGNOSIS — E785 Hyperlipidemia, unspecified: Secondary | ICD-10-CM | POA: Diagnosis not present

## 2017-05-09 DIAGNOSIS — Z902 Acquired absence of lung [part of]: Secondary | ICD-10-CM | POA: Insufficient documentation

## 2017-05-09 DIAGNOSIS — R112 Nausea with vomiting, unspecified: Secondary | ICD-10-CM | POA: Insufficient documentation

## 2017-05-09 DIAGNOSIS — R5382 Chronic fatigue, unspecified: Secondary | ICD-10-CM

## 2017-05-09 DIAGNOSIS — C3432 Malignant neoplasm of lower lobe, left bronchus or lung: Secondary | ICD-10-CM | POA: Diagnosis not present

## 2017-05-09 DIAGNOSIS — J449 Chronic obstructive pulmonary disease, unspecified: Secondary | ICD-10-CM | POA: Diagnosis not present

## 2017-05-09 DIAGNOSIS — Z5111 Encounter for antineoplastic chemotherapy: Secondary | ICD-10-CM

## 2017-05-09 DIAGNOSIS — E039 Hypothyroidism, unspecified: Secondary | ICD-10-CM | POA: Insufficient documentation

## 2017-05-09 DIAGNOSIS — E860 Dehydration: Secondary | ICD-10-CM | POA: Insufficient documentation

## 2017-05-09 DIAGNOSIS — C7951 Secondary malignant neoplasm of bone: Secondary | ICD-10-CM

## 2017-05-09 DIAGNOSIS — C3492 Malignant neoplasm of unspecified part of left bronchus or lung: Secondary | ICD-10-CM

## 2017-05-09 DIAGNOSIS — Z9221 Personal history of antineoplastic chemotherapy: Secondary | ICD-10-CM | POA: Diagnosis not present

## 2017-05-09 DIAGNOSIS — Z923 Personal history of irradiation: Secondary | ICD-10-CM | POA: Insufficient documentation

## 2017-05-09 DIAGNOSIS — I481 Persistent atrial fibrillation: Secondary | ICD-10-CM | POA: Insufficient documentation

## 2017-05-09 DIAGNOSIS — R6883 Chills (without fever): Secondary | ICD-10-CM | POA: Insufficient documentation

## 2017-05-09 DIAGNOSIS — R310 Gross hematuria: Secondary | ICD-10-CM | POA: Diagnosis not present

## 2017-05-09 DIAGNOSIS — R410 Disorientation, unspecified: Secondary | ICD-10-CM | POA: Diagnosis not present

## 2017-05-09 DIAGNOSIS — Z5112 Encounter for antineoplastic immunotherapy: Secondary | ICD-10-CM | POA: Insufficient documentation

## 2017-05-09 DIAGNOSIS — C7989 Secondary malignant neoplasm of other specified sites: Secondary | ICD-10-CM

## 2017-05-09 LAB — CBC WITH DIFFERENTIAL/PLATELET
BASOS ABS: 0 10*3/uL (ref 0.0–0.1)
Basophils Relative: 0 %
EOS PCT: 0 %
Eosinophils Absolute: 0 10*3/uL (ref 0.0–0.5)
HEMATOCRIT: 37.5 % — AB (ref 38.4–49.9)
Hemoglobin: 13.1 g/dL (ref 13.0–17.1)
LYMPHS ABS: 0.6 10*3/uL — AB (ref 0.9–3.3)
Lymphocytes Relative: 15 %
MCH: 32.9 pg (ref 27.2–33.4)
MCHC: 34.8 g/dL (ref 32.0–36.0)
MCV: 94.4 fL (ref 79.3–98.0)
MONO ABS: 0.4 10*3/uL (ref 0.1–0.9)
Monocytes Relative: 9 %
NEUTROS ABS: 3.1 10*3/uL (ref 1.5–6.5)
Neutrophils Relative %: 76 %
PLATELETS: 457 10*3/uL — AB (ref 140–400)
RBC: 3.97 MIL/uL — AB (ref 4.20–5.82)
RDW: 17.3 % — ABNORMAL HIGH (ref 11.0–14.6)
WBC: 4 10*3/uL (ref 4.0–10.3)

## 2017-05-09 LAB — COMPREHENSIVE METABOLIC PANEL
ALBUMIN: 3 g/dL — AB (ref 3.5–5.0)
ALT: 15 U/L (ref 0–55)
ANION GAP: 12 — AB (ref 3–11)
AST: 23 U/L (ref 5–34)
Alkaline Phosphatase: 94 U/L (ref 40–150)
BUN: 12 mg/dL (ref 7–26)
CHLORIDE: 106 mmol/L (ref 98–109)
CO2: 23 mmol/L (ref 22–29)
Calcium: 9.7 mg/dL (ref 8.4–10.4)
Creatinine, Ser: 0.86 mg/dL (ref 0.70–1.30)
GFR calc Af Amer: >60 mL/min (ref >60)
GFR calc non Af Amer: >60 mL/min (ref >60)
GLUCOSE: 153 mg/dL — AB (ref 70–140)
POTASSIUM: 4.8 mmol/L (ref 3.5–5.1)
Sodium: 141 mmol/L (ref 136–145)
Total Bilirubin: 0.5 mg/dL (ref 0.2–1.2)
Total Protein: 7 g/dL (ref 6.4–8.3)

## 2017-05-09 LAB — TSH: TSH: 7.61 u[IU]/mL — ABNORMAL HIGH (ref 0.320–4.118)

## 2017-05-09 MED ORDER — CYANOCOBALAMIN 1000 MCG/ML IJ SOLN
1000.0000 ug | Freq: Once | INTRAMUSCULAR | Status: AC
  Administered 2017-05-09: 1000 ug via INTRAMUSCULAR

## 2017-05-09 MED ORDER — DEXAMETHASONE 4 MG PO TABS: ORAL_TABLET | ORAL | 1 refills | Status: DC

## 2017-05-09 MED ORDER — PROCHLORPERAZINE MALEATE 10 MG PO TABS
10.0000 mg | ORAL_TABLET | Freq: Once | ORAL | Status: AC
  Administered 2017-05-09: 10 mg via ORAL

## 2017-05-09 MED ORDER — CYANOCOBALAMIN 1000 MCG/ML IJ SOLN
INTRAMUSCULAR | Status: AC
  Filled 2017-05-09: qty 1

## 2017-05-09 MED ORDER — PEMETREXED DISODIUM CHEMO INJECTION 500 MG
1200.0000 mg | Freq: Once | INTRAVENOUS | Status: AC
  Administered 2017-05-09: 1200 mg via INTRAVENOUS
  Filled 2017-05-09: qty 40

## 2017-05-09 MED ORDER — SODIUM CHLORIDE 0.9 % IV SOLN
Freq: Once | INTRAVENOUS | Status: AC
  Administered 2017-05-09: 10:00:00 via INTRAVENOUS

## 2017-05-09 MED ORDER — PROCHLORPERAZINE MALEATE 10 MG PO TABS
ORAL_TABLET | ORAL | Status: AC
  Filled 2017-05-09: qty 1

## 2017-05-09 MED ORDER — SODIUM CHLORIDE 0.9 % IV SOLN
200.0000 mg | Freq: Once | INTRAVENOUS | Status: AC
  Administered 2017-05-09: 200 mg via INTRAVENOUS
  Filled 2017-05-09: qty 8

## 2017-05-09 NOTE — Progress Notes (Signed)
[facility] [phone]   [phone]  OFFICE PROGRESS NOTE  [name], MD [address] [place] [address]  DIAGNOSIS: Metastatic non-small cell lung cancer initially diagnosed as stage IIIA (T2a, N2, M0) non-small cell lung cancer, poorly differentiated adenocarcinoma presented with left lower lobe lung mass in addition to mediastinal lymphadenopathy.  The patient was diagnosed with metastatic disease involving the left femur as well as left supraclavicular nodal metastases and right paratracheal lymphadenopathy in October 2018.  Biomarker Findings Microsatellite Status - MS-Stable Tumor Mutational Burden - TMB-Low (3 Muts/Mb) Genomic Findings For a complete list of the genes assayed, please refer to the Appendix. STK11 P245f*6 [id number] site 1494+4H>QDAXX E374* MLL2 [id number] NBN K23369f NOTCH2 [id number] splice site [id number]+6Z>LDisease relevant genes with no reportable alterations: EGFR, KRAS, ALK, BRAF, MET, RET, ERBB2, ROS1   PDL1 expression 5%  PRIOR THERAPY: 1) status post wedge resection of the left lower lobe lung mass as well as AP window lymph node dissection but there was residual metastatic mediastinal lymphadenopathy that could not be resected. 2) a course of concurrent chemorad[place] carbopl[place]  3) status post palliative radiotherapy to the left femur metastatic bone disease.   CURRENT THERAPY: Systemic chemotherapy with carboplatin for AC of 5, Alimta 500 mg/M2 and Keytruda 200 mg IV every 3 weeks.  First dose March 07, 2017.  Carboplatin was discontinued during cycle #2 secondary to hypersensitivity reaction.  The patient is currently on treatment with pemetrexed and Keytruda.  Status post 2 cycles.  INTERVAL HISTORY: Victor Huber 74o. male returns to the clinic today for follow-up visit accompanied by his wife.  The patient  continues to feel fatigued and tired after his treatment.  He has some recent falls with a change in position.  He also has occasional nausea with intermittent headache.  He denied having any chest pain, shortness breath, cough or hemoptysis.  He denied having any pain.  He denied having any bleeding issues.  He lost a few pounds since his last visit.  The patient had repeat CT scan of the chest, abdomen and pelvis as well as CT scan of the left femur and he is here today for evaluation and discussion of his discuss results.  MEDICAL HISTORY: Past Medical History:  Diagnosis Date  . Adenocarcinoma of left lung, stage 3 ([facility]/16/2018  . Arthritis   . Atrial fibrillation ([facility]/16/2018  . Atrial fibrillation ([place] . COPD (chronic obstructive pulmonary disease) ([place]/16/2018  . History of chemotherapy   . History of radiation therapy   . Hyperlipidemia   . Hypothyroid 08/15/2016  . Longstanding persistent atrial fibrillation ([facility]CC[facility]/30/2018  . Pathologic fracture    left femur  . Pneumonitis   . S/P TURP 08/15/2016  . Wears glasses   . Wears hearing aid in both ears     ALLERGIES:  is allergic to carboplatin and flecainide.  MEDICATIONS:  Current Outpatient Medications  Medication Sig Dispense Refill  . apixaban (ELIQUIS) 5 MG TABS tablet Take 5 mg by mouth 2 (two) times daily.    . aMarland Kitchenenolol (TENORMIN) 50 MG tablet Take 50 mg by mouth daily.     . aMarland Kitchenorvastatin (LIPITOR) 40 MG tablet Take 40 mg by mouth daily.    . baclofen (LIORESAL) 10 MG tablet Take 1 tablet (10 mg total) by mouth 3 (three) times daily. As needed for  muscle spasm 50 tablet 0  . chlorpheniramine-HYDROcodone (TUSSIONEX) 10-8 MG/5ML SUER Take 5 mLs by mouth every 12 (twelve) hours as needed for cough. 345 mL 0  . citalopram (CELEXA) 40 MG tablet Take 40 mg by mouth daily.    Marland Kitchen dexamethasone (DECADRON) 4 MG tablet 4 mg p.o. twice daily the day before, day of and day after chemotherapy every 3 weeks 40 tablet 1  .  doxycycline (VIBRA-TABS) 100 MG tablet Take 1 tablet (100 mg total) by mouth 2 (two) times daily. 14 tablet 0  . folic acid (FOLVITE) 1 MG tablet Take 1 tablet (1 mg total) by mouth daily. 30 tablet 4  . ibuprofen (ADVIL,MOTRIN) 400 MG tablet Take 400 mg by mouth every 4 (four) hours as needed for moderate pain.    Marland Kitchen levothyroxine (SYNTHROID, LEVOTHROID) 200 MCG tablet Take 200 mcg by mouth daily before breakfast.    . levothyroxine (SYNTHROID, LEVOTHROID) 25 MCG tablet Take 25 mcg by mouth daily.    Marland Kitchen LORazepam (ATIVAN) 0.5 MG tablet Take 0.5 mg daily as needed by mouth.     . ondansetron (ZOFRAN) 4 MG tablet Take 1 tablet (4 mg total) by mouth every 8 (eight) hours as needed for nausea or vomiting. 30 tablet 0  . oxyCODONE (ROXICODONE) 5 MG immediate release tablet Take 1-2 tablets (5-10 mg total) by mouth every 4 (four) hours as needed for severe pain. 50 tablet 0  . predniSONE (DELTASONE) 5 MG tablet 6 tab x 1 day, 5 tab x 1 day, 4 tab x 1 day, 3 tab X 1 day, 2 tab x 1 day, 1 tab x 1 day 21 tablet 0  . prochlorperazine (COMPAZINE) 10 MG tablet Take 1 tablet (10 mg total) by mouth every 6 (six) hours as needed for nausea or vomiting. 30 tablet 0  . sennosides-docusate sodium (SENOKOT-S) 8.6-50 MG tablet Take 2 tablets by mouth daily. 30 tablet 1  . tamsulosin (FLOMAX) 0.4 MG CAPS capsule Take 0.4 mg daily by mouth.     . zolpidem (AMBIEN) 5 MG tablet Take 5 mg by mouth at bedtime as needed for sleep.     No current facility-administered medications for this visit.     SURGICAL HISTORY:  Past Surgical History:  Procedure Laterality Date  . BRONCHOSCOPY  10/2014  . CARDIAC CATHETERIZATION     05/07/12  . CARDIOVERSION     x2  . COLONOSCOPY    . DG BIOPSY LUNG Left 10/2014   FNA - Adenocarcinoma   . FEMUR IM NAIL Left 02/19/2017  . FEMUR IM NAIL Left 02/19/2017   Procedure: INTRAMEDULLARY (IM) NAIL FEMORAL;  Surgeon: Marchia Bond, MD;  Location: [facility];  Service: Orthopedics;   Laterality: Left;  . LUNG CANCER SURGERY Left 12/2014   Wedge Resection   . MULTIPLE TOOTH EXTRACTIONS    . Status post TURP    . TONSILLECTOMY      REVIEW OF SYSTEMS:  Constitutional: positive for anorexia, fatigue and weight loss Eyes: negative Ears, nose, mouth, throat, and face: negative Respiratory: negative Cardiovascular: negative Gastrointestinal: negative Genitourinary:negative Integument/breast: negative Hematologic/lymphatic: negative Musculoskeletal:negative Neurological: negative Behavioral/Psych: negative Endocrine: negative Allergic/Immunologic: negative   PHYSICAL EXAMINATION: General appearance: alert, cooperative, fatigued and no distress Head: Normocephalic, without obvious abnormality, atraumatic Neck: no adenopathy, no JVD, supple, symmetrical, trachea midline and thyroid not enlarged, symmetric, no tenderness/mass/nodules Lymph nodes: Cervical, supraclavicular, and axillary nodes normal. Resp: clear to auscultation bilaterally Back: symmetric, no curvature. ROM normal. No CVA tenderness. Cardio: regular  rate and rhythm, S1, S2 normal, no murmur, click, rub or gallop GI: soft, non-tender; bowel sounds normal; no masses,  no organomegaly Extremities: extremities normal, atraumatic, no cyanosis or edema Neurologic: Alert and oriented X 3, normal strength and tone. Normal symmetric reflexes. Normal coordination and gait  ECOG PERFORMANCE STATUS: 1 - Symptomatic but completely ambulatory  Blood pressure (!) 95/56, pulse 91, temperature 97.8 F (36.6 C), temperature source Oral, resp. rate 18, height _0  (1.854 m), weight 238 lb 14.4 oz (108.4 kg), SpO2 98 %.  LABORATORY DATA: Lab Results  Component Value Date   WBC 8.6 04/18/2017   HGB 14.0 04/18/2017   HCT 40.6 04/18/2017   MCV 95.1 04/18/2017   PLT 304 04/18/2017      Chemistry      Component Value Date/Time   NA 137 04/18/2017 0844   NA 136 04/04/2017 1141   K 4.5 04/18/2017 0844   K 4.4  04/04/2017 1141   CL 106 04/18/2017 0844   CO2 20 (L) 04/18/2017 0844   CO2 24 04/04/2017 1141   BUN 18 04/18/2017 0844   BUN 21.1 04/04/2017 1141   CREATININE 0.85 04/18/2017 0844   CREATININE 1.1 04/04/2017 1141      Component Value Date/Time   CALCIUM 9.5 04/18/2017 0844   CALCIUM 9.1 04/04/2017 1141   ALKPHOS 77 04/18/2017 0844   ALKPHOS 89 04/04/2017 1141   AST 26 04/18/2017 0844   AST 17 04/04/2017 1141   ALT 23 04/18/2017 0844   ALT 18 04/04/2017 1141   BILITOT 0.5 04/18/2017 0844   BILITOT 0.67 04/04/2017 1141       RADIOGRAPHIC STUDIES: Ct Chest W Contrast  Result Date: 05/07/2017 CLINICAL DATA:  Followup left lung adenocarcinoma. Undergoing chemotherapy. Restaging. Previous surgery and radiation therapy. EXAM: CT CHEST, ABDOMEN, AND PELVIS WITH CONTRAST TECHNIQUE: Multidetector CT imaging of the chest, abdomen and pelvis was performed following the standard protocol during bolus administration of intravenous contrast. CONTRAST:  186m ISOVUE-300 IOPAMIDOL (ISOVUE-300) INJECTION 61% COMPARISON:  PET-CT on 01/12/2017 FINDINGS: CT CHEST FINDINGS Cardiovascular: No acute findings. Aortic and coronary artery atherosclerosis. Mediastinum/Lymph Nodes: Mild right paratracheal lymphadenopathy is again seen, with largest lymph node on image 22/2 measuring 17 mm on image 22/2, compared to 11 mm previously. Subcarinal lymph node measures 18 mm compared to 16 mm previously. Previously seen left supraclavicular lymph node is no longer visualized. No hilar or axillary lymphadenopathy identified. Lungs/Pleura: Stable postsurgical and post radiation changes seen in left perihilar region. No suspicious pulmonary nodules or masses are identified. Moderate emphysema again demonstrated. New tree-in-bud opacities are seen in the right middle lobe, consistent with infectious or inflammatory etiology. No evidence of pulmonary consolidation or pleural effusion. Musculoskeletal:  No suspicious bone lesions  identified. CT ABDOMEN AND PELVIS FINDINGS Hepatobiliary: No masses identified. Multiple calcified gallstones are again seen, without evidence of cholecystitis or biliary ductal dilatation. Pancreas:  No mass or inflammatory changes. Spleen:  Within normal limits in size and appearance. Adrenals/Urinary tract: Normal adrenal glands. Stable small renal cysts. No masses or hydronephrosis. Stomach/Bowel: No evidence of obstruction, inflammatory process, or abnormal fluid collections. Vascular/Lymphatic: No pathologically enlarged lymph nodes identified. No abdominal aortic aneurysm. Aortic atherosclerosis. Reproductive:  No mass or other significant abnormality identified. Other:  None. Musculoskeletal:  No suspicious bone lesions identified. IMPRESSION: Slight increase in size of several right paratracheal and subcarinal mediastinal lymph nodes. Previously seen mild left supraclavicular lymphadenopathy no longer visualized. No evidence of abdominal or pelvic metastatic disease. New right  middle lobe tree-in-bud opacities, consistent with infectious or inflammatory process. Moderate emphysema. Cholelithiasis.  No radiographic evidence of cholecystitis. Aortic and coronary artery atherosclerosis. Electronically Signed   By: Earle Gell M.D.   On: 05/07/2017 11:27   Ct Abdomen Pelvis W Contrast  Result Date: 05/07/2017 CLINICAL DATA:  Followup left lung adenocarcinoma. Undergoing chemotherapy. Restaging. Previous surgery and radiation therapy. EXAM: CT CHEST, ABDOMEN, AND PELVIS WITH CONTRAST TECHNIQUE: Multidetector CT imaging of the chest, abdomen and pelvis was performed following the standard protocol during bolus administration of intravenous contrast. CONTRAST:  128m ISOVUE-300 IOPAMIDOL (ISOVUE-300) INJECTION 61% COMPARISON:  PET-CT on 01/12/2017 FINDINGS: CT CHEST FINDINGS Cardiovascular: No acute findings. Aortic and coronary artery atherosclerosis. Mediastinum/Lymph Nodes: Mild right paratracheal  lymphadenopathy is again seen, with largest lymph node on image 22/2 measuring 17 mm on image 22/2, compared to 11 mm previously. Subcarinal lymph node measures 18 mm compared to 16 mm previously. Previously seen left supraclavicular lymph node is no longer visualized. No hilar or axillary lymphadenopathy identified. Lungs/Pleura: Stable postsurgical and post radiation changes seen in left perihilar region. No suspicious pulmonary nodules or masses are identified. Moderate emphysema again demonstrated. New tree-in-bud opacities are seen in the right middle lobe, consistent with infectious or inflammatory etiology. No evidence of pulmonary consolidation or pleural effusion. Musculoskeletal:  No suspicious bone lesions identified. CT ABDOMEN AND PELVIS FINDINGS Hepatobiliary: No masses identified. Multiple calcified gallstones are again seen, without evidence of cholecystitis or biliary ductal dilatation. Pancreas:  No mass or inflammatory changes. Spleen:  Within normal limits in size and appearance. Adrenals/Urinary tract: Normal adrenal glands. Stable small renal cysts. No masses or hydronephrosis. Stomach/Bowel: No evidence of obstruction, inflammatory process, or abnormal fluid collections. Vascular/Lymphatic: No pathologically enlarged lymph nodes identified. No abdominal aortic aneurysm. Aortic atherosclerosis. Reproductive:  No mass or other significant abnormality identified. Other:  None. Musculoskeletal:  No suspicious bone lesions identified. IMPRESSION: Slight increase in size of several right paratracheal and subcarinal mediastinal lymph nodes. Previously seen mild left supraclavicular lymphadenopathy no longer visualized. No evidence of abdominal or pelvic metastatic disease. New right middle lobe tree-in-bud opacities, consistent with infectious or inflammatory process. Moderate emphysema. Cholelithiasis.  No radiographic evidence of cholecystitis. Aortic and coronary artery atherosclerosis.  Electronically Signed   By: JEarle GellM.D.   On: 05/07/2017 11:27   Ct Femur Left W Contrast  Result Date: 05/07/2017 CLINICAL DATA:  Metastatic lung cancer to the left femur. EXAM: CT OF THE LOWER LEFT EXTREMITY WITH CONTRAST TECHNIQUE: Multidetector CT imaging of the lower left extremity was performed according to the standard protocol following intravenous contrast administration. COMPARISON:  Radiographs dated 02/19/2017 CONTRAST:  1024mISOVUE-300 IOPAMIDOL (ISOVUE-300) INJECTION 61% FINDINGS: Bones/Joint/Cartilage Again noted is a lytic lesion of the distal left femoral shaft extending over a 12 cm distance. There is periosteal reaction around the tumor with two small full-thickness cortical defects. No pathologic fracture. Intramedullary rod and fixation screws in place. No visible soft tissue extension of tumor. Muscles and Tendons Normal. Soft tissues Atherosclerosis. IMPRESSION: Stable lytic metastasis in the left femoral shaft with periosteal reaction. No pathologic fracture. Electronically Signed   By: JaLorriane Shire.D.   On: 05/07/2017 10:18    ASSESSMENT AND PLAN: This is a [age]ears old white male with metastatic non-small cell lung cancer, adenocarcinoma with no actionable mutations and PDL 1 expression of 5% that was initially diagnosed as stage IIIa non-small cell lung cancer, adenocarcinoma status post left lower lobectomy with lymph node dissection followed by  a course of concurrent chemoradiation completed in January 2016.  The patient had evidence for disease metastasis in October 2018 with metastatic disease to the left femur as well as left supraclavicular and right paratracheal lymph nodes. The patient is currently on systemic chemotherapy initially was with carboplatin, Alimta and Keytruda.  Carboplatin was discontinued secondary to hypersensitivity reaction starting from cycle #2.  He is currently on treatment with Alimta and Keytruda status post a total of 3 cycles. He has been  tolerating this treatment well with no concerning complaints except for fatigue and mild nausea. He had repeat CT scan of the chest, abdomen and pelvis as well as CT scan of the left femur.  Has a scan showed a stable disease except for mild increase in some of the mediastinal lymph nodes. I personally and independently reviewed the scan images and discussed the results with the patient and his wife.  The mild increase in the mediastinal lymph nodes could be secondary to pseudo-progression.  I will continue the patient on the same regimen for now.  We will repeat CT scan after cycle #6 for reevaluation of his disease and if there is any further evidence for disease progression, we will consider switching to a different regimen. The patient and his wife agreed to the current plan. He will proceed with cycle #4 today. The patient will come back for follow-up visit in 3 weeks for evaluation before the next cycle of his treatment. For the nausea he would continue his treatment with Compazine and Zofran. He was advised to call immediately if he has any concerning symptoms in the interval. The patient voices understanding of current disease status and treatment options and is in agreement with the current care plan. All questions were answered. The patient knows to call the clinic with any problems, questions or concerns. We can certainly see the patient much sooner if necessary.  Disclaimer: This note was dictated with voice recognition software. Similar sounding words can inadvertently be transcribed and may not be corrected upon review.

## 2017-05-09 NOTE — Telephone Encounter (Signed)
Gave avs and calendar for February - may

## 2017-05-09 NOTE — Patient Instructions (Signed)
[facility] Discharge Instructions for Patients Receiving Chemotherapy  Today you received the following chemotherapy agents: Pembrolizumab (Keytruda) and Pemetrexed (Alimta).  To help prevent nausea and vomiting after your treatment, we encourage you to take your nausea medication as prescribed. If you develop nausea and vomiting that is not controlled by your nausea medication, call the clinic.   BELOW ARE SYMPTOMS THAT SHOULD BE REPORTED IMMEDIATELY:  *FEVER GREATER THAN 100.5 F  *CHILLS WITH OR WITHOUT FEVER  NAUSEA AND VOMITING THAT IS NOT CONTROLLED WITH YOUR NAUSEA MEDICATION  *UNUSUAL SHORTNESS OF BREATH  *UNUSUAL BRUISING OR BLEEDING  TENDERNESS IN MOUTH AND THROAT WITH OR WITHOUT PRESENCE OF ULCERS  *URINARY PROBLEMS  *BOWEL PROBLEMS  UNUSUAL RASH Items with * indicate a potential emergency and should be followed up as soon as possible.  Feel free to call the clinic should you have any questions or concerns. The clinic phone number is [phone].  Please show the [facility] at check-in to the Emergency Department and triage nurse.

## 2017-05-14 ENCOUNTER — Other Ambulatory Visit: Payer: Self-pay | Admitting: Medical

## 2017-05-14 ENCOUNTER — Inpatient Hospital Stay (HOSPITAL_BASED_OUTPATIENT_CLINIC_OR_DEPARTMENT_OTHER): Payer: Medicare HMO | Admitting: Medical

## 2017-05-14 ENCOUNTER — Inpatient Hospital Stay: Payer: Medicare HMO | Admitting: Medical

## 2017-05-14 ENCOUNTER — Other Ambulatory Visit: Payer: Self-pay | Admitting: Medical Oncology

## 2017-05-14 ENCOUNTER — Telehealth: Payer: Self-pay | Admitting: *Deleted

## 2017-05-14 ENCOUNTER — Ambulatory Visit (HOSPITAL_BASED_OUTPATIENT_CLINIC_OR_DEPARTMENT_OTHER): Payer: Medicare Other | Admitting: Medical

## 2017-05-14 VITALS — BP 128/111 | HR 58 | Temp 98.4°F | Resp 20

## 2017-05-14 DIAGNOSIS — R51 Headache: Principal | ICD-10-CM

## 2017-05-14 DIAGNOSIS — Z5112 Encounter for antineoplastic immunotherapy: Secondary | ICD-10-CM | POA: Diagnosis not present

## 2017-05-14 DIAGNOSIS — C7951 Secondary malignant neoplasm of bone: Secondary | ICD-10-CM

## 2017-05-14 DIAGNOSIS — C7989 Secondary malignant neoplasm of other specified sites: Secondary | ICD-10-CM

## 2017-05-14 DIAGNOSIS — R6883 Chills (without fever): Secondary | ICD-10-CM

## 2017-05-14 DIAGNOSIS — R410 Disorientation, unspecified: Secondary | ICD-10-CM

## 2017-05-14 DIAGNOSIS — R110 Nausea: Secondary | ICD-10-CM

## 2017-05-14 DIAGNOSIS — Z9079 Acquired absence of other genital organ(s): Secondary | ICD-10-CM

## 2017-05-14 DIAGNOSIS — R112 Nausea with vomiting, unspecified: Secondary | ICD-10-CM

## 2017-05-14 DIAGNOSIS — E860 Dehydration: Secondary | ICD-10-CM

## 2017-05-14 DIAGNOSIS — J449 Chronic obstructive pulmonary disease, unspecified: Secondary | ICD-10-CM

## 2017-05-14 DIAGNOSIS — E032 Hypothyroidism due to medicaments and other exogenous substances: Secondary | ICD-10-CM

## 2017-05-14 DIAGNOSIS — I480 Paroxysmal atrial fibrillation: Secondary | ICD-10-CM

## 2017-05-14 DIAGNOSIS — C3492 Malignant neoplasm of unspecified part of left bronchus or lung: Secondary | ICD-10-CM

## 2017-05-14 DIAGNOSIS — Z79899 Other long term (current) drug therapy: Secondary | ICD-10-CM

## 2017-05-14 DIAGNOSIS — R519 Headache, unspecified: Secondary | ICD-10-CM

## 2017-05-14 DIAGNOSIS — R310 Gross hematuria: Secondary | ICD-10-CM

## 2017-05-14 DIAGNOSIS — C3432 Malignant neoplasm of lower lobe, left bronchus or lung: Secondary | ICD-10-CM

## 2017-05-14 LAB — URINALYSIS, COMPLETE (UACMP) WITH MICROSCOPIC
Glucose, UA: NEGATIVE mg/dL
Hgb urine dipstick: NEGATIVE
Ketones, ur: NEGATIVE mg/dL
LEUKOCYTES UA: NEGATIVE
Nitrite: NEGATIVE
PH: 7 (ref 5.0–8.0)
RBC / HPF: NONE SEEN RBC/hpf (ref 0–5)
SPECIFIC GRAVITY, URINE: 1.015 (ref 1.005–1.030)
Squamous Epithelial / LPF: NONE SEEN
WBC, UA: NONE SEEN WBC/hpf (ref 0–5)

## 2017-05-14 LAB — CBC WITH DIFFERENTIAL (CANCER CENTER ONLY)
BASOS ABS: 0 10*3/uL (ref 0.0–0.1)
Basophils Relative: 0 %
EOS ABS: 0 10*3/uL (ref 0.0–0.5)
EOS PCT: 0 %
HCT: 39.6 % (ref 38.4–49.9)
Hemoglobin: 13.4 g/dL (ref 13.0–17.1)
LYMPHS PCT: 13 %
Lymphs Abs: 1.2 10*3/uL (ref 0.9–3.3)
MCH: 32.2 pg (ref 27.2–33.4)
MCHC: 33.8 g/dL (ref 32.0–36.0)
MCV: 95.2 fL (ref 79.3–98.0)
Monocytes Absolute: 0.1 10*3/uL (ref 0.1–0.9)
Monocytes Relative: 1 %
Neutro Abs: 7.7 10*3/uL — ABNORMAL HIGH (ref 1.5–6.5)
Neutrophils Relative %: 86 %
PLATELETS: 282 10*3/uL (ref 140–400)
RBC: 4.15 MIL/uL — AB (ref 4.20–5.82)
RDW: 17 % — ABNORMAL HIGH (ref 11.0–14.6)
WBC: 9.1 10*3/uL (ref 4.0–10.3)

## 2017-05-14 LAB — CMP (CANCER CENTER ONLY)
ALT: 28 U/L (ref 0–55)
AST: 27 U/L (ref 5–34)
Albumin: 3.3 g/dL — ABNORMAL LOW (ref 3.5–5.0)
Alkaline Phosphatase: 94 U/L (ref 40–150)
Anion gap: 15 — ABNORMAL HIGH (ref 3–11)
BILIRUBIN TOTAL: 2.4 mg/dL — AB (ref 0.2–1.2)
BUN: 21 mg/dL (ref 7–26)
CO2: 21 mmol/L — ABNORMAL LOW (ref 22–29)
Calcium: 9.4 mg/dL (ref 8.4–10.4)
Chloride: 101 mmol/L (ref 98–109)
Creatinine: 0.96 mg/dL (ref 0.70–1.30)
Glucose, Bld: 122 mg/dL (ref 70–140)
POTASSIUM: 4.4 mmol/L (ref 3.5–5.1)
Sodium: 137 mmol/L (ref 136–145)
TOTAL PROTEIN: 7.3 g/dL (ref 6.4–8.3)

## 2017-05-14 MED ORDER — SODIUM CHLORIDE 0.9 % IV SOLN: INTRAVENOUS | Status: DC

## 2017-05-14 MED ORDER — SODIUM CHLORIDE 0.9 % IV SOLN
Freq: Once | INTRAVENOUS | Status: AC
  Administered 2017-05-14: 11:00:00 via INTRAVENOUS

## 2017-05-14 MED ORDER — ONDANSETRON HCL 4 MG PO TABS: 4.0000 mg | ORAL_TABLET | Freq: Three times a day (TID) | ORAL | 0 refills | Status: DC | PRN

## 2017-05-14 MED ORDER — LORAZEPAM 0.5 MG PO TABS: 0.5000 mg | ORAL_TABLET | Freq: Three times a day (TID) | ORAL | 0 refills | Status: DC | PRN

## 2017-05-14 MED ORDER — SODIUM CHLORIDE 0.9 % IV SOLN: Freq: Once | INTRAVENOUS | Status: DC

## 2017-05-14 MED ORDER — ONDANSETRON HCL 4 MG/2ML IJ SOLN
INTRAMUSCULAR | Status: AC
  Filled 2017-05-14: qty 4

## 2017-05-14 MED ORDER — ACETAMINOPHEN 325 MG PO TABS: 650.0000 mg | ORAL_TABLET | Freq: Once | ORAL | Status: DC

## 2017-05-14 MED ORDER — ONDANSETRON HCL 4 MG/2ML IJ SOLN
8.0000 mg | Freq: Once | INTRAMUSCULAR | Status: AC
  Administered 2017-05-14: 8 mg via INTRAVENOUS

## 2017-05-14 MED ORDER — SULFAMETHOXAZOLE-TRIMETHOPRIM 800-160 MG PO TABS: 1.0000 | ORAL_TABLET | Freq: Two times a day (BID) | ORAL | 0 refills | Status: DC

## 2017-05-14 NOTE — Patient Instructions (Signed)
Dehydration, Adult Dehydration is a condition in which there is not enough fluid or water in the body. This happens when you lose more fluids than you take in. Important organs, such as the kidneys, brain, and heart, cannot function without a proper amount of fluids. Any loss of fluids from the body can lead to dehydration. Dehydration can range from mild to severe. This condition should be treated right away to prevent it from becoming severe. What are the causes? This condition may be caused by:  Vomiting.  Diarrhea.  Excessive sweating, such as from heat exposure or exercise.  Not drinking enough fluid, especially: ? When ill. ? While doing activity that requires a lot of energy.  Excessive urination.  Fever.  Infection.  Certain medicines, such as medicines that cause the body to lose excess fluid (diuretics).  Inability to access safe drinking water.  Reduced physical ability to get adequate water and food.  What increases the risk? This condition is more likely to develop in people:  Who have a poorly controlled long-term (chronic) illness, such as diabetes, heart disease, or kidney disease.  Who are age 65 or older.  Who are disabled.  Who live in a place with high altitude.  Who play endurance sports.  What are the signs or symptoms? Symptoms of mild dehydration may include:  Thirst.  Dry lips.  Slightly dry mouth.  Dry, warm skin.  Dizziness. Symptoms of moderate dehydration may include:  Very dry mouth.  Muscle cramps.  Dark urine. Urine may be the color of tea.  Decreased urine production.  Decreased tear production.  Heartbeat that is irregular or faster than normal (palpitations).  Headache.  Light-headedness, especially when you stand up from a sitting position.  Fainting (syncope). Symptoms of severe dehydration may include:  Changes in skin, such as: ? Cold and clammy skin. ? Blotchy (mottled) or pale skin. ? Skin that does  not quickly return to normal after being lightly pinched and released (poor skin turgor).  Changes in body fluids, such as: ? Extreme thirst. ? No tear production. ? Inability to sweat when body temperature is high, such as in hot weather. ? Very little urine production.  Changes in vital signs, such as: ? Weak pulse. ? Pulse that is more than 100 beats a minute when sitting still. ? Rapid breathing. ? Low blood pressure.  Other changes, such as: ? Sunken eyes. ? Cold hands and feet. ? Confusion. ? Lack of energy (lethargy). ? Difficulty waking up from sleep. ? Short-term weight loss. ? Unconsciousness. How is this diagnosed? This condition is diagnosed based on your symptoms and a physical exam. Blood and urine tests may be done to help confirm the diagnosis. How is this treated? Treatment for this condition depends on the severity. Mild or moderate dehydration can often be treated at home. Treatment should be started right away. Do not wait until dehydration becomes severe. Severe dehydration is an emergency and it needs to be treated in a hospital. Treatment for mild dehydration may include:  Drinking more fluids.  Replacing salts and minerals in your blood (electrolytes) that you may have lost. Treatment for moderate dehydration may include:  Drinking an oral rehydration solution (ORS). This is a drink that helps you replace fluids and electrolytes (rehydrate). It can be found at pharmacies and retail stores. Treatment for severe dehydration may include:  Receiving fluids through an IV tube.  Receiving an electrolyte solution through a feeding tube that is passed through your nose   and into your stomach (nasogastric tube, or NG tube).  Correcting any abnormalities in electrolytes.  Treating the underlying cause of dehydration. Follow these instructions at home:  If directed by your health care provider, drink an ORS: ? Make an ORS by following instructions on the  package. ? Start by drinking small amounts, about  cup (120 mL) every 5-10 minutes. ? Slowly increase how much you drink until you have taken the amount recommended by your health care provider.  Drink enough clear fluid to keep your urine clear or pale yellow. If you were told to drink an ORS, finish the ORS first, then start slowly drinking other clear fluids. Drink fluids such as: ? Water. Do not drink only water. Doing that can lead to having too little salt (sodium) in the body (hyponatremia). ? Ice chips. ? Fruit juice that you have added water to (diluted fruit juice). ? Low-calorie sports drinks.  Avoid: ? Alcohol. ? Drinks that contain a lot of sugar. These include high-calorie sports drinks, fruit juice that is not diluted, and soda. ? Caffeine. ? Foods that are greasy or contain a lot of fat or sugar.  Take over-the-counter and prescription medicines only as told by your health care provider.  Do not take sodium tablets. This can lead to having too much sodium in the body (hypernatremia).  Eat foods that contain a healthy balance of electrolytes, such as bananas, oranges, potatoes, tomatoes, and spinach.  Keep all follow-up visits as told by your health care provider. This is important. Contact a health care provider if:  You have abdominal pain that: ? Gets worse. ? Stays in one area (localizes).  You have a rash.  You have a stiff neck.  You are more irritable than usual.  You are sleepier or more difficult to wake up than usual.  You feel weak or dizzy.  You feel very thirsty.  You have urinated only a small amount of very dark urine over 6-8 hours. Get help right away if:  You have symptoms of severe dehydration.  You cannot drink fluids without vomiting.  Your symptoms get worse with treatment.  You have a fever.  You have a severe headache.  You have vomiting or diarrhea that: ? Gets worse. ? Does not go away.  You have blood or green matter  (bile) in your vomit.  You have blood in your stool. This may cause stool to look black and tarry.  You have not urinated in 6-8 hours.  You faint.  Your heart rate while sitting still is over 100 beats a minute.  You have trouble breathing. This information is not intended to replace advice given to you by your health care provider. Make sure you discuss any questions you have with your health care provider. Document Released: 03/19/2005 Document Revised: 10/14/2015 Document Reviewed: 05/13/2015 Elsevier Interactive Patient Education  2018 [employer].    Nausea, Adult Nausea is the feeling of an upset stomach or having to vomit. Nausea on its own is not usually a serious concern, but it may be an early sign of a more serious medical problem. As nausea gets worse, it can lead to vomiting. If vomiting develops, or if you are not able to drink enough fluids, you are at risk of becoming dehydrated. Dehydration can make you tired and thirsty, cause you to have a dry mouth, and decrease how often you urinate. Older adults and people with other diseases or a weak immune system are at higher risk  for dehydration. The main goals of treating your nausea are:  To limit repeated nausea episodes.  To prevent vomiting and dehydration.  Follow these instructions at home: Follow instructions from your health care provider about how to care for yourself at home. Eating and drinking Follow these recommendations as told by your health care provider:  Take an oral rehydration solution (ORS). This is a drink that is sold at pharmacies and retail stores.  Drink clear fluids in small amounts as you are able. Clear fluids include water, ice chips, diluted fruit juice, and low-calorie sports drinks.  Eat bland, easy-to-digest foods in small amounts as you are able. These foods include bananas, applesauce, rice, lean meats, toast, and crackers.  Avoid drinking fluids that contain a lot of sugar or  caffeine, such as energy drinks, sports drinks, and soda.  Avoid alcohol.  Avoid spicy or fatty foods.  General instructions  Drink enough fluid to keep your urine clear or pale yellow.  Wash your hands often. If soap and water are not available, use hand sanitizer.  Make sure that all people in your household wash their hands well and often.  Rest at home while you recover.  Take over-the-counter and prescription medicines only as told by your health care provider.  Breathe slowly and deeply when you feel nauseous.  Watch your condition for any changes.  Keep all follow-up visits as told by your health care provider. This is important. Contact a health care provider if:  You have a headache.  You have new symptoms.  Your nausea gets worse.  You have a fever.  You feel light-headed or dizzy.  You vomit.  You cannot keep fluids down. Get help right away if:  You have pain in your chest, neck, arm, or jaw.  You feel extremely weak or you faint.  You have vomit that is bright red or looks like coffee grounds.  You have bloody or black stools or stools that look like tar.  You have a severe headache, a stiff neck, or both.  You have severe pain, cramping, or bloating in your abdomen.  You have a rash.  You have difficulty breathing or are breathing very quickly.  Your heart is beating very quickly.  Your skin feels cold and clammy.  You feel confused.  You have pain when you urinate.  You have signs of dehydration, such as: ? Dark urine, very little, or no urine. ? Cracked lips. ? Dry mouth. ? Sunken eyes. ? Sleepiness. ? Weakness. These symptoms may represent a serious problem that is an emergency. Do not wait to see if the symptoms will go away. Get medical help right away. Call your local emergency services (911 in the [place] Do not drive yourself to the hospital. This information is not intended to replace advice given to you by your health care  provider. Make sure you discuss any questions you have with your health care provider. Document Released: 04/26/2004 Document Revised: 08/22/2015 Document Reviewed: 11/23/2014 Elsevier Interactive Patient Education  [employer].

## 2017-05-14 NOTE — Telephone Encounter (Signed)
Received call from wife [name] reporting that pt had not eaten in 36 hours, drinking fluid minimally.  Denied fever, has headache and aching all over.  Bowel and bladder function fine.  Symptoms started on Sunday night. Dr. Julien Nordmann notified.  Spoke with  [name] again, and instructed her to bring pt in at 0930 for IVF with antiemetic today as per md's instructions.  [name] voiced understanding. Schedule message sent by Levander Campion, desk nurse. Pt's   Phone   [phone].

## 2017-05-14 NOTE — Progress Notes (Signed)
Pt somewhat restless upon arrival to [facility]. Possibly d/t dehydration/nausea.  Having periods of being confused, forgetting he has an IV, not knowing why he is here. Spoke with wife and she agreed that this is not his baseline. She states he is confused when normally he is not.  Spoke with Dr. Julien Nordmann and he agreed to have Sandi Mealy, PA see this pt.  Per order, Sandi Mealy, PA CBC/diff and CMP labs ordered. Lab made aware of add on and need for labs to be done in [facility].  Wife made aware of the above.   Pt discharged home after 4 hours of IV fluids. States his nausea is better, still confused at times: not understanding he has an IV in place, didn't know year or month or city. Not as restless. C/o feeling cold though afebrile.  Blood cultures done x 2 urine specimen collected for u/a and culture.  Pt has what appears to be blood in his urine. Sandi Mealy, PA made aware.

## 2017-05-15 ENCOUNTER — Telehealth: Payer: Self-pay | Admitting: *Deleted

## 2017-05-15 NOTE — Progress Notes (Signed)
These preliminary result these preliminary results were noted.  Awaiting final report.

## 2017-05-15 NOTE — Telephone Encounter (Signed)
TCT pt's wife to follow up on her husband's visit to [facility] yesterday, 05/14/17.   She states he is doing much better. She states the confusion he experienced has resolved. His nausea has also gotten better. He took only 1 antiemetic today.  She states he slept well and is eating and drinking well.. Advised her that urine culture is not back yet but he needs to complete his antibiotic as ordered.  She verbalizes understanding to call the cancer center witany changes in her husband's condition.

## 2017-05-16 ENCOUNTER — Other Ambulatory Visit: Payer: Medicare Other

## 2017-05-16 LAB — URINE CULTURE

## 2017-05-16 NOTE — Progress Notes (Signed)
These preliminary result these preliminary results were noted.  Awaiting final report.

## 2017-05-16 NOTE — Progress Notes (Signed)
[facility] Progress Note   Victor Huber [id number] [birth year] 67 y.o.  Victor Huber is managed by Dr. Eilleen Kempf  Actively treated with chemotherapy: yes  Current Therapy: Alimta and Keytruda  Last Treated: 05/09/2017 (cycle 4, day 1)    Assessment: Plan:    Confusion  Chills (without fever)  Hematuria, gross  Adenocarcinoma of left lung, stage 4 (HCC)  Nausea and vomiting, intractability of vomiting not specified, unspecified vomiting type   Confusion: The patient was seen with Dr. Julien Nordmann.  Dr. Julien Nordmann told the patient and his wife that the patient's confusion could be coming from his immunotherapy.  [name] was given 1.5 L of normal saline and was instructed to push fluids.  He is scheduled to return to see Dr. Julien Nordmann on 05/30/2017 and will return sooner if needed.  Chills with gross hematuria: A urine was submitted with the patient's first urination.  Results returned showing a color that was amber, a small amount of bilirubin, trace protein, and rare bacteria.  The patient's subsequent urination in a urinal was positive for gross hematuria according to her nurse.  The first urine was sent for a urine culture.  [name] was treated empirically for possible prostatitis with Bactrim DS p.o. twice daily times 14 days given his development of gross hematuria and ongoing chills.  He remained afebrile during his visit today.  Nausea with vomiting: Patient was given a refill of lorazepam 0.5 mg every 8 hours as needed for nausea and Zofran 4 mg every 8 hours as needed for nausea.  Stage IV adenocarcinoma of the lung: Mr. Molina is status post cycle 4-day 1 of Alimta and Keytruda dosed on 05/09/2017.  He is scheduled to follow-up with Dr. Julien Nordmann on 05/30/2017.  Please see After Visit Summary for patient specific instructions.  Future Appointments  Date Time Provider [place]  05/30/2017  9:15 AM [facility] LAB 3 [facility] None   05/30/2017  9:45 AM Curt Bears, MD [facility] None  05/30/2017 11:00 AM [facility] G24 [facility] None  06/20/2017  9:45 AM [facility] LAB 4 [facility] None  06/20/2017 10:15 AM Curt Bears, MD [facility] None  06/20/2017 11:00 AM [facility] G24 [facility] None  07/11/2017  8:15 AM [facility] LAB 1 [facility] None  07/11/2017  8:45 AM Curt Bears, MD [facility] None  07/11/2017  9:45 AM [facility] D11 [facility] None  08/01/2017  8:30 AM [facility] LAB 2 [facility] None  08/01/2017  9:00 AM Curt Bears, MD [facility] None  08/01/2017 10:00 AM [facility] A2 [facility] None    No orders of the defined types were placed in this encounter.      Subjective:   Patient ID:  Victor Huber is a 67 y.o. (DOB 1950-05-01) male.  Chief Complaint: No chief complaint on file.   HPI Victor Huber is a 66 year old male with a diagnosis of a metastatic non-small cell lung cancer.  He is status post cycle 4-day 1 of Alimta and Keytruda dosed on 05/09/2017.  He presents to the clinic today with his wife.  He has had nausea and vomiting since Sunday.  He reports having generalized achiness, headache, and chills with no fever.  He has been using Compazine for his nausea.  He has been having mild confusion.  Medications: I have reviewed the patient's current medications.  Allergies:  Allergies  Allergen Reactions  . Carboplatin Itching, Nausea And Vomiting and Other (See Comments)    Flushing  . Flecainide Hypertension    CAUSED  HEART ISSUES     Past Medical History:  Diagnosis Date  . Adenocarcinoma of left lung, stage 3 ([place]) 08/15/2016  . Arthritis   . Atrial fibrillation ([place]) 08/15/2016  . Atrial fibrillation ([place])   . COPD (chronic obstructive pulmonary disease) ([place]) 08/15/2016  . History of chemotherapy   . History of radiation therapy   . Hyperlipidemia   . Hypothyroid 08/15/2016  . Longstanding persistent atrial fibrillation ([place]) 08/29/2016  .  Pathologic fracture    left femur  . Pneumonitis   . S/P TURP 08/15/2016  . Wears glasses   . Wears hearing aid in both ears     Past Surgical History:  Procedure Laterality Date  . BRONCHOSCOPY  10/2014  . CARDIAC CATHETERIZATION     05/07/12  . CARDIOVERSION     x2  . COLONOSCOPY    . DG BIOPSY LUNG Left 10/2014   FNA - Adenocarcinoma   . FEMUR IM NAIL Left 02/19/2017  . FEMUR IM NAIL Left 02/19/2017   Procedure: INTRAMEDULLARY (IM) NAIL FEMORAL;  Surgeon: Marchia Bond, MD;  Location: [facility];  Service: Orthopedics;  Laterality: Left;  . LUNG CANCER SURGERY Left 12/2014   Wedge Resection   . MULTIPLE TOOTH EXTRACTIONS    . Status post TURP    . TONSILLECTOMY      Family History  Problem Relation Age of Onset  . Breast cancer Mother   . Breast cancer Maternal Aunt   . Breast cancer Maternal Aunt   . Lung disease Neg Hx     Social History   Socioeconomic History  . Marital status: Married    Spouse name: Not on file  . Number of children: Not on file  . Years of education: Not on file  . Highest education level: Not on file  Social Needs  . Financial resource strain: Not on file  . Food insecurity - worry: Not on file  . Food insecurity - inability: Not on file  . Transportation needs - medical: Not on file  . Transportation needs - non-medical: Not on file  Occupational History  . Not on file  Tobacco Use  . Smoking status: Former Smoker    Packs/day: 1.00    Years: 50.00    Pack years: 50.00    Types: Cigarettes    Last attempt to quit: 08/15/2012    Years since quitting: 4.7  . Smokeless tobacco: Never Used  Substance and Sexual Activity  . Alcohol use: No  . Drug use: No  . Sexual activity: Not on file  Other Topics Concern  . Not on file  Social History Narrative   [facility] (09/26/16):   Originally from [place]. Moved to [facility] February 2018. Always lived in [place]. Moved to be closer to children & grandchildren. No international travel.  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 No mold exposure. No bird exposure or hot tub exposure. Enjoys reading. Previously enjoyed wood working with domestic woods.     Past Medical History, Surgical history, Social history, and Family history were reviewed and updated as appropriate.   Please see review of systems for further details on the patient's review from today.   Review of Systems:  Review of Systems  Constitutional: Positive for chills. Negative for diaphoresis and fever.  HENT: Negative for trouble swallowing.   Respiratory: Negative for cough, choking, chest tightness, shortness of breath and wheezing.   Cardiovascular: Negative for chest pain  and palpitations.  Gastrointestinal: Positive for nausea and vomiting. Negative for constipation and diarrhea.  Psychiatric/Behavioral: Positive for confusion.    Objective:   Physical Exam:  There were no vitals taken for this visit. ECOG: 1  Physical Exam  Constitutional: No distress.  The patient is an adult male who appears to be intermittently confused but no acute distress.  HENT:  Head: Normocephalic and atraumatic.  Cardiovascular: Normal rate, regular rhythm and normal heart sounds. Exam reveals no gallop and no friction rub.  No murmur heard. Pulmonary/Chest: Effort normal. No respiratory distress. He has no wheezes. He has no rales.  Abdominal: Soft. Bowel sounds are normal. He exhibits no distension. There is no tenderness. There is no rebound and no guarding.  Neurological: Coordination (Mr. Goshert is ambulating with the use of wheelchair.) abnormal.  Skin: Skin is warm and dry. No rash noted. He is not diaphoretic. No erythema.  Psychiatric:  The patient was intermittently confused.  He did not know that it was February.  He stated that the year was 2012.  He was unable to tell me that he was in [place].  He did know who the current  president was.  He knew that Daisy Floro was the president.    Lab Review:     Component Value Date/Time   NA 137 05/14/2017 1145   NA 136 04/04/2017 1141   K 4.4 05/14/2017 1145   K 4.4 04/04/2017 1141   CL 101 05/14/2017 1145   CO2 21 (L) 05/14/2017 1145   CO2 24 04/04/2017 1141   GLUCOSE 122 05/14/2017 1145   GLUCOSE 116 04/04/2017 1141   BUN 21 05/14/2017 1145   BUN 21.1 04/04/2017 1141   CREATININE 0.96 05/14/2017 1145   CREATININE 1.1 04/04/2017 1141   CALCIUM 9.4 05/14/2017 1145   CALCIUM 9.1 04/04/2017 1141   PROT 7.3 05/14/2017 1145   PROT 6.6 04/04/2017 1141   ALBUMIN 3.3 (L) 05/14/2017 1145   ALBUMIN 3.5 04/04/2017 1141   AST 27 05/14/2017 1145   AST 17 04/04/2017 1141   ALT 28 05/14/2017 1145   ALT 18 04/04/2017 1141   ALKPHOS 94 05/14/2017 1145   ALKPHOS 89 04/04/2017 1141   BILITOT 2.4 (H) 05/14/2017 1145   BILITOT 0.67 04/04/2017 1141   GFRNONAA >60 05/14/2017 1145   GFRAA >60 05/14/2017 1145       Component Value Date/Time   WBC 9.1 05/14/2017 1145   WBC 4.0 05/09/2017 0824   RBC 4.15 (L) 05/14/2017 1145   HGB 13.1 05/09/2017 0824   HGB 14.2 04/04/2017 1141   HCT 39.6 05/14/2017 1145   HCT 41.3 04/04/2017 1141   PLT 282 05/14/2017 1145   PLT 119 (L) 04/04/2017 1141   MCV 95.2 05/14/2017 1145   MCV 94.5 04/04/2017 1141   MCH 32.2 05/14/2017 1145   MCHC 33.8 05/14/2017 1145   RDW 17.0 (H) 05/14/2017 1145   RDW 14.6 04/04/2017 1141   LYMPHSABS 1.2 05/14/2017 1145   LYMPHSABS 0.9 04/04/2017 1141   MONOABS 0.1 05/14/2017 1145   MONOABS 0.3 04/04/2017 1141   EOSABS 0.0 05/14/2017 1145   EOSABS 0.0 04/04/2017 1141   BASOSABS 0.0 05/14/2017 1145   BASOSABS 0.0 04/04/2017 1141   -------------------------------  Imaging from last 24 hours (if applicable):  Radiology interpretation: Ct Chest W Contrast  Result Date: 05/07/2017 CLINICAL DATA:  Followup left lung adenocarcinoma. Undergoing chemotherapy. Restaging. Previous surgery and radiation  therapy. EXAM: CT CHEST, ABDOMEN, AND PELVIS WITH CONTRAST TECHNIQUE: Multidetector CT  imaging of the chest, abdomen and pelvis was performed following the standard protocol during bolus administration of intravenous contrast. CONTRAST:  161mL ISOVUE-300 IOPAMIDOL (ISOVUE-300) INJECTION 61% COMPARISON:  PET-CT on 01/12/2017 FINDINGS: CT CHEST FINDINGS Cardiovascular: No acute findings. Aortic and coronary artery atherosclerosis. Mediastinum/Lymph Nodes: Mild right paratracheal lymphadenopathy is again seen, with largest lymph node on image 22/2 measuring 17 mm on image 22/2, compared to 11 mm previously. Subcarinal lymph node measures 18 mm compared to 16 mm previously. Previously seen left supraclavicular lymph node is no longer visualized. No hilar or axillary lymphadenopathy identified. Lungs/Pleura: Stable postsurgical and post radiation changes seen in left perihilar region. No suspicious pulmonary nodules or masses are identified. Moderate emphysema again demonstrated. New tree-in-bud opacities are seen in the right middle lobe, consistent with infectious or inflammatory etiology. No evidence of pulmonary consolidation or pleural effusion. Musculoskeletal:  No suspicious bone lesions identified. CT ABDOMEN AND PELVIS FINDINGS Hepatobiliary: No masses identified. Multiple calcified gallstones are again seen, without evidence of cholecystitis or biliary ductal dilatation. Pancreas:  No mass or inflammatory changes. Spleen:  Within normal limits in size and appearance. Adrenals/Urinary tract: Normal adrenal glands. Stable small renal cysts. No masses or hydronephrosis. Stomach/Bowel: No evidence of obstruction, inflammatory process, or abnormal fluid collections. Vascular/Lymphatic: No pathologically enlarged lymph nodes identified. No abdominal aortic aneurysm. Aortic atherosclerosis. Reproductive:  No mass or other significant abnormality identified. Other:  None. Musculoskeletal:  No suspicious bone lesions  identified. IMPRESSION: Slight increase in size of several right paratracheal and subcarinal mediastinal lymph nodes. Previously seen mild left supraclavicular lymphadenopathy no longer visualized. No evidence of abdominal or pelvic metastatic disease. New right middle lobe tree-in-bud opacities, consistent with infectious or inflammatory process. Moderate emphysema. Cholelithiasis.  No radiographic evidence of cholecystitis. Aortic and coronary artery atherosclerosis. Electronically Signed   By: Earle Gell M.D.   On: 05/07/2017 11:27   Ct Abdomen Pelvis W Contrast  Result Date: 05/07/2017 CLINICAL DATA:  Followup left lung adenocarcinoma. Undergoing chemotherapy. Restaging. Previous surgery and radiation therapy. EXAM: CT CHEST, ABDOMEN, AND PELVIS WITH CONTRAST TECHNIQUE: Multidetector CT imaging of the chest, abdomen and pelvis was performed following the standard protocol during bolus administration of intravenous contrast. CONTRAST:  164mL ISOVUE-300 IOPAMIDOL (ISOVUE-300) INJECTION 61% COMPARISON:  PET-CT on 01/12/2017 FINDINGS: CT CHEST FINDINGS Cardiovascular: No acute findings. Aortic and coronary artery atherosclerosis. Mediastinum/Lymph Nodes: Mild right paratracheal lymphadenopathy is again seen, with largest lymph node on image 22/2 measuring 17 mm on image 22/2, compared to 11 mm previously. Subcarinal lymph node measures 18 mm compared to 16 mm previously. Previously seen left supraclavicular lymph node is no longer visualized. No hilar or axillary lymphadenopathy identified. Lungs/Pleura: Stable postsurgical and post radiation changes seen in left perihilar region. No suspicious pulmonary nodules or masses are identified. Moderate emphysema again demonstrated. New tree-in-bud opacities are seen in the right middle lobe, consistent with infectious or inflammatory etiology. No evidence of pulmonary consolidation or pleural effusion. Musculoskeletal:  No suspicious bone lesions identified. CT  ABDOMEN AND PELVIS FINDINGS Hepatobiliary: No masses identified. Multiple calcified gallstones are again seen, without evidence of cholecystitis or biliary ductal dilatation. Pancreas:  No mass or inflammatory changes. Spleen:  Within normal limits in size and appearance. Adrenals/Urinary tract: Normal adrenal glands. Stable small renal cysts. No masses or hydronephrosis. Stomach/Bowel: No evidence of obstruction, inflammatory process, or abnormal fluid collections. Vascular/Lymphatic: No pathologically enlarged lymph nodes identified. No abdominal aortic aneurysm. Aortic atherosclerosis. Reproductive:  No mass or other significant abnormality identified. Other:  None. Musculoskeletal:  No suspicious bone lesions identified. IMPRESSION: Slight increase in size of several right paratracheal and subcarinal mediastinal lymph nodes. Previously seen mild left supraclavicular lymphadenopathy no longer visualized. No evidence of abdominal or pelvic metastatic disease. New right middle lobe tree-in-bud opacities, consistent with infectious or inflammatory process. Moderate emphysema. Cholelithiasis.  No radiographic evidence of cholecystitis. Aortic and coronary artery atherosclerosis. Electronically Signed   By: Earle Gell M.D.   On: 05/07/2017 11:27   Ct Femur Left W Contrast  Result Date: 05/07/2017 CLINICAL DATA:  Metastatic lung cancer to the left femur. EXAM: CT OF THE LOWER LEFT EXTREMITY WITH CONTRAST TECHNIQUE: Multidetector CT imaging of the lower left extremity was performed according to the standard protocol following intravenous contrast administration. COMPARISON:  Radiographs dated 02/19/2017 CONTRAST:  138mL ISOVUE-300 IOPAMIDOL (ISOVUE-300) INJECTION 61% FINDINGS: Bones/Joint/Cartilage Again noted is a lytic lesion of the distal left femoral shaft extending over a 12 cm distance. There is periosteal reaction around the tumor with two small full-thickness cortical defects. No pathologic fracture.  Intramedullary rod and fixation screws in place. No visible soft tissue extension of tumor. Muscles and Tendons Normal. Soft tissues Atherosclerosis. IMPRESSION: Stable lytic metastasis in the left femoral shaft with periosteal reaction. No pathologic fracture. Electronically Signed   By: Lorriane Shire M.D.   On: 05/07/2017 10:18        This patient was seen with Dr. Julien Nordmann with my treatment plan reviewed with him. He expressed agreement with my medical management of this patient.  ADDENDUM: Hematology/Oncology Attending: I had a face-to-face encounter with the patient.  I recommended his care plan.  This is a very pleasant 67 years old white male with metastatic non-small cell lung cancer, adenocarcinoma.  The patient is currently undergoing systemic treatment with Alimta and Keytruda. Carboplatin was discontinued during cycle #2 secondary to hypersensitivity reaction. He presented to the clinic today with confusion as well as persistent nausea and vomiting over the last few days. The patient was taken Compazine at home with no improvement.  We started him on IV hydration with normal saline as well as Zofran. He felt much better after the IV hydration.  He was advised to continue with his antiemetics at home after discharge from the clinic. The patient was advised to call immediately if he has any concerning symptoms in the interval.  Disclaimer: This note was dictated with voice recognition software. Similar sounding words can inadvertently be transcribed and may be missed upon review. Eilleen Kempf, MD 05/17/17

## 2017-05-17 ENCOUNTER — Encounter: Payer: Self-pay | Admitting: Medical

## 2017-05-17 NOTE — Progress Notes (Signed)
These preliminary result these preliminary results were noted.  Awaiting final report.

## 2017-05-19 LAB — CULTURE, BLOOD (SINGLE)
Culture: NO GROWTH
Culture: NO GROWTH

## 2017-05-20 NOTE — Progress Notes (Signed)
These preliminary result these preliminary results were noted.  Awaiting final report.

## 2017-05-23 ENCOUNTER — Other Ambulatory Visit: Payer: Medicare Other

## 2017-05-27 ENCOUNTER — Telehealth: Payer: Self-pay | Admitting: *Deleted

## 2017-05-27 NOTE — Telephone Encounter (Signed)
"  My husband received chemotherapy two weeks ago.  Next treatment due Thursday.  Off and on for the past two weeks he's had fever, shaking chills nausea and vomiting.  Nausea pills are not working.  Threw up once yesterday.  Unable to eat.  Took lorazepam and ondansetron at 12:30.  Fever has been 100.9.  Now temp = 102.  Tylenol has also been used.  Says his body aches.  No exposure to anyone with the flu.  He's not been out the house nor has anyone been in the house."  Verbal order received and read back from Dr. Julien Nordmann for patient to report to ED for evaluation.  Order given to [place] at this time.  "He does not want to go to the ED.  Is there something he can be given to help at home?   No, he needs to go to the ED.  Temperature very elevated.  Needs further evaluation at this time with reported symptoms, temperature has risen despite Tylenol.  "I'll try to get him to go to the ED but he's already said he does not want to go."

## 2017-05-28 ENCOUNTER — Inpatient Hospital Stay (HOSPITAL_BASED_OUTPATIENT_CLINIC_OR_DEPARTMENT_OTHER): Payer: Medicare HMO | Admitting: Medical

## 2017-05-28 ENCOUNTER — Other Ambulatory Visit: Payer: Self-pay | Admitting: Medical Oncology

## 2017-05-28 ENCOUNTER — Telehealth: Payer: Self-pay | Admitting: Medical Oncology

## 2017-05-28 ENCOUNTER — Inpatient Hospital Stay: Payer: Medicare HMO

## 2017-05-28 VITALS — BP 155/73 | HR 50 | Temp 98.4°F | Resp 20 | Ht 73.0 in | Wt 238.9 lb

## 2017-05-28 DIAGNOSIS — R509 Fever, unspecified: Secondary | ICD-10-CM

## 2017-05-28 DIAGNOSIS — L309 Dermatitis, unspecified: Secondary | ICD-10-CM

## 2017-05-28 DIAGNOSIS — C3492 Malignant neoplasm of unspecified part of left bronchus or lung: Secondary | ICD-10-CM

## 2017-05-28 DIAGNOSIS — Z5112 Encounter for antineoplastic immunotherapy: Secondary | ICD-10-CM | POA: Diagnosis not present

## 2017-05-28 DIAGNOSIS — E860 Dehydration: Secondary | ICD-10-CM | POA: Diagnosis not present

## 2017-05-28 DIAGNOSIS — R112 Nausea with vomiting, unspecified: Secondary | ICD-10-CM

## 2017-05-28 DIAGNOSIS — Z79899 Other long term (current) drug therapy: Secondary | ICD-10-CM

## 2017-05-28 DIAGNOSIS — C3432 Malignant neoplasm of lower lobe, left bronchus or lung: Secondary | ICD-10-CM | POA: Diagnosis not present

## 2017-05-28 DIAGNOSIS — E032 Hypothyroidism due to medicaments and other exogenous substances: Secondary | ICD-10-CM | POA: Diagnosis not present

## 2017-05-28 DIAGNOSIS — C7951 Secondary malignant neoplasm of bone: Secondary | ICD-10-CM

## 2017-05-28 DIAGNOSIS — Z923 Personal history of irradiation: Secondary | ICD-10-CM

## 2017-05-28 DIAGNOSIS — Z9221 Personal history of antineoplastic chemotherapy: Secondary | ICD-10-CM

## 2017-05-28 DIAGNOSIS — R410 Disorientation, unspecified: Secondary | ICD-10-CM

## 2017-05-28 DIAGNOSIS — R5382 Chronic fatigue, unspecified: Secondary | ICD-10-CM

## 2017-05-28 DIAGNOSIS — C7989 Secondary malignant neoplasm of other specified sites: Secondary | ICD-10-CM

## 2017-05-28 LAB — CBC WITH DIFFERENTIAL (CANCER CENTER ONLY)
BASOS ABS: 0 10*3/uL (ref 0.0–0.1)
BASOS PCT: 1 %
EOS PCT: 10 %
Eosinophils Absolute: 0.4 10*3/uL (ref 0.0–0.5)
HEMATOCRIT: 37.8 % — AB (ref 38.4–49.9)
Hemoglobin: 12.6 g/dL — ABNORMAL LOW (ref 13.0–17.1)
Lymphocytes Relative: 21 %
Lymphs Abs: 0.7 10*3/uL — ABNORMAL LOW (ref 0.9–3.3)
MCH: 32.9 pg (ref 27.2–33.4)
MCHC: 33.3 g/dL (ref 32.0–36.0)
MCV: 98.7 fL — AB (ref 79.3–98.0)
MONO ABS: 0.5 10*3/uL (ref 0.1–0.9)
Monocytes Relative: 13 %
NEUTROS ABS: 2 10*3/uL (ref 1.5–6.5)
Neutrophils Relative %: 55 %
PLATELETS: 159 10*3/uL (ref 140–400)
RBC: 3.83 MIL/uL — AB (ref 4.20–5.82)
RDW: 18.6 % — AB (ref 11.0–14.6)
WBC: 3.6 10*3/uL — AB (ref 4.0–10.3)

## 2017-05-28 LAB — CMP (CANCER CENTER ONLY)
ALK PHOS: 140 U/L (ref 40–150)
ALT: 59 U/L — ABNORMAL HIGH (ref 0–55)
AST: 68 U/L — ABNORMAL HIGH (ref 5–34)
Albumin: 3.2 g/dL — ABNORMAL LOW (ref 3.5–5.0)
Anion gap: 9 (ref 3–11)
BILIRUBIN TOTAL: 0.6 mg/dL (ref 0.2–1.2)
BUN: 20 mg/dL (ref 7–26)
CALCIUM: 9.1 mg/dL (ref 8.4–10.4)
CO2: 25 mmol/L (ref 22–29)
Chloride: 99 mmol/L (ref 98–109)
Creatinine: 1.1 mg/dL (ref 0.70–1.30)
GLUCOSE: 115 mg/dL (ref 70–140)
Potassium: 4.4 mmol/L (ref 3.5–5.1)
Sodium: 133 mmol/L — ABNORMAL LOW (ref 136–145)
TOTAL PROTEIN: 6.7 g/dL (ref 6.4–8.3)

## 2017-05-28 LAB — URINALYSIS, COMPLETE (UACMP) WITH MICROSCOPIC
BILIRUBIN URINE: NEGATIVE
GLUCOSE, UA: NEGATIVE mg/dL
HGB URINE DIPSTICK: NEGATIVE
KETONES UR: NEGATIVE mg/dL
LEUKOCYTES UA: NEGATIVE
NITRITE: NEGATIVE
PH: 5 (ref 5.0–8.0)
Protein, ur: 30 mg/dL — AB
SPECIFIC GRAVITY, URINE: 1.031 — AB (ref 1.005–1.030)
SQUAMOUS EPITHELIAL / LPF: NONE SEEN

## 2017-05-28 LAB — TSH: TSH: 9.445 u[IU]/mL — ABNORMAL HIGH (ref 0.320–4.118)

## 2017-05-28 MED ORDER — DEXAMETHASONE 4 MG PO TABS: ORAL_TABLET | ORAL | 1 refills | Status: DC

## 2017-05-28 MED ORDER — SODIUM CHLORIDE 0.9 % IV SOLN
2.0000 g | Freq: Once | INTRAVENOUS | Status: AC
  Administered 2017-05-28: 2 g via INTRAVENOUS
  Filled 2017-05-28: qty 2

## 2017-05-28 MED ORDER — SODIUM CHLORIDE 0.9 % IV SOLN
Freq: Once | INTRAVENOUS | Status: AC
Start: 2017-05-28 — End: 2017-05-28
  Administered 2017-05-28: 16:00:00 via INTRAVENOUS

## 2017-05-28 MED ORDER — BETAMETHASONE DIPROPIONATE 0.05 % EX CREA: TOPICAL_CREAM | Freq: Two times a day (BID) | CUTANEOUS | 1 refills | Status: DC

## 2017-05-28 MED ORDER — CEFTRIAXONE SODIUM 2 G IJ SOLR: 2.0000 g | Freq: Once | INTRAMUSCULAR | Status: DC

## 2017-05-28 MED ORDER — CEFUROXIME AXETIL 500 MG PO TABS: 500.0000 mg | ORAL_TABLET | Freq: Two times a day (BID) | ORAL | 0 refills | Status: DC

## 2017-05-28 NOTE — Patient Instructions (Signed)
Dehydration, Adult Dehydration is a condition in which there is not enough fluid or water in the body. This happens when you lose more fluids than you take in. Important organs, such as the kidneys, brain, and heart, cannot function without a proper amount of fluids. Any loss of fluids from the body can lead to dehydration. Dehydration can range from mild to severe. This condition should be treated right away to prevent it from becoming severe. What are the causes? This condition may be caused by:  Vomiting.  Diarrhea.  Excessive sweating, such as from heat exposure or exercise.  Not drinking enough fluid, especially: ? When ill. ? While doing activity that requires a lot of energy.  Excessive urination.  Fever.  Infection.  Certain medicines, such as medicines that cause the body to lose excess fluid (diuretics).  Inability to access safe drinking water.  Reduced physical ability to get adequate water and food.  What increases the risk? This condition is more likely to develop in people:  Who have a poorly controlled long-term (chronic) illness, such as diabetes, heart disease, or kidney disease.  Who are age 65 or older.  Who are disabled.  Who live in a place with high altitude.  Who play endurance sports.  What are the signs or symptoms? Symptoms of mild dehydration may include:  Thirst.  Dry lips.  Slightly dry mouth.  Dry, warm skin.  Dizziness. Symptoms of moderate dehydration may include:  Very dry mouth.  Muscle cramps.  Dark urine. Urine may be the color of tea.  Decreased urine production.  Decreased tear production.  Heartbeat that is irregular or faster than normal (palpitations).  Headache.  Light-headedness, especially when you stand up from a sitting position.  Fainting (syncope). Symptoms of severe dehydration may include:  Changes in skin, such as: ? Cold and clammy skin. ? Blotchy (mottled) or pale skin. ? Skin that does  not quickly return to normal after being lightly pinched and released (poor skin turgor).  Changes in body fluids, such as: ? Extreme thirst. ? No tear production. ? Inability to sweat when body temperature is high, such as in hot weather. ? Very little urine production.  Changes in vital signs, such as: ? Weak pulse. ? Pulse that is more than 100 beats a minute when sitting still. ? Rapid breathing. ? Low blood pressure.  Other changes, such as: ? Sunken eyes. ? Cold hands and feet. ? Confusion. ? Lack of energy (lethargy). ? Difficulty waking up from sleep. ? Short-term weight loss. ? Unconsciousness. How is this diagnosed? This condition is diagnosed based on your symptoms and a physical exam. Blood and urine tests may be done to help confirm the diagnosis. How is this treated? Treatment for this condition depends on the severity. Mild or moderate dehydration can often be treated at home. Treatment should be started right away. Do not wait until dehydration becomes severe. Severe dehydration is an emergency and it needs to be treated in a hospital. Treatment for mild dehydration may include:  Drinking more fluids.  Replacing salts and minerals in your blood (electrolytes) that you may have lost. Treatment for moderate dehydration may include:  Drinking an oral rehydration solution (ORS). This is a drink that helps you replace fluids and electrolytes (rehydrate). It can be found at pharmacies and retail stores. Treatment for severe dehydration may include:  Receiving fluids through an IV tube.  Receiving an electrolyte solution through a feeding tube that is passed through your nose   and into your stomach (nasogastric tube, or NG tube).  Correcting any abnormalities in electrolytes.  Treating the underlying cause of dehydration. Follow these instructions at home:  If directed by your health care provider, drink an ORS: ? Make an ORS by following instructions on the  package. ? Start by drinking small amounts, about  cup (120 mL) every 5-10 minutes. ? Slowly increase how much you drink until you have taken the amount recommended by your health care provider.  Drink enough clear fluid to keep your urine clear or pale yellow. If you were told to drink an ORS, finish the ORS first, then start slowly drinking other clear fluids. Drink fluids such as: ? Water. Do not drink only water. Doing that can lead to having too little salt (sodium) in the body (hyponatremia). ? Ice chips. ? Fruit juice that you have added water to (diluted fruit juice). ? Low-calorie sports drinks.  Avoid: ? Alcohol. ? Drinks that contain a lot of sugar. These include high-calorie sports drinks, fruit juice that is not diluted, and soda. ? Caffeine. ? Foods that are greasy or contain a lot of fat or sugar.  Take over-the-counter and prescription medicines only as told by your health care provider.  Do not take sodium tablets. This can lead to having too much sodium in the body (hypernatremia).  Eat foods that contain a healthy balance of electrolytes, such as bananas, oranges, potatoes, tomatoes, and spinach.  Keep all follow-up visits as told by your health care provider. This is important. Contact a health care provider if:  You have abdominal pain that: ? Gets worse. ? Stays in one area (localizes).  You have a rash.  You have a stiff neck.  You are more irritable than usual.  You are sleepier or more difficult to wake up than usual.  You feel weak or dizzy.  You feel very thirsty.  You have urinated only a small amount of very dark urine over 6-8 hours. Get help right away if:  You have symptoms of severe dehydration.  You cannot drink fluids without vomiting.  Your symptoms get worse with treatment.  You have a fever.  You have a severe headache.  You have vomiting or diarrhea that: ? Gets worse. ? Does not go away.  You have blood or green matter  (bile) in your vomit.  You have blood in your stool. This may cause stool to look black and tarry.  You have not urinated in 6-8 hours.  You faint.  Your heart rate while sitting still is over 100 beats a minute.  You have trouble breathing. This information is not intended to replace advice given to you by your health care provider. Make sure you discuss any questions you have with your health care provider. Document Released: 03/19/2005 Document Revised: 10/14/2015 Document Reviewed: 05/13/2015 Elsevier Interactive Patient Education  2018 [employer]  

## 2017-05-28 NOTE — Telephone Encounter (Signed)
persistent nausea , headaches and intermittent confusion, new itching on back- alt zofran, ativan , compazine and premed decadron. Temp 99 today no chills ( yesterday had high fever and chills and did not go to ED as directed). {er [name] appt for labs and [facility]. Orders placed.

## 2017-05-29 NOTE — Progress Notes (Signed)
[facility] Progress Note   Victor Huber [id number] [birth year] 67 y.o.  Victor Huber is managed by Dr. Eilleen Kempf  Actively treated with chemotherapy: yes  Current Therapy: Keytruda and Alimta  Last Treated: 05/09/2017 (cycle 4, day 1)  Assessment: Plan:    Fever, unspecified fever cause - Plan: ceFEPIme (MAXIPIME) 2 g in sodium chloride 0.9 % 100 mL IVPB, cefUROXime (CEFTIN) 500 MG tablet, DISCONTINUED: cefTRIAXone (ROCEPHIN) 2 g in dextrose 5 % 50 mL IVPB, DISCONTINUED: cefUROXime (CEFTIN) 500 MG tablet  Dehydration - Plan: 0.9 %  sodium chloride infusion  Nausea and vomiting, intractability of vomiting not specified, unspecified vomiting type - Plan: MR Brain W Wo Contrast  Eczema, unspecified type - Plan: betamethasone dipropionate (DIPROLENE) 0.05 % cream  Hypothyroidism due to non-medication exogenous substances   Fever: The patient was given cefepime 2 g IV x1 and given a prescription for Ceftin 500 mg p.o. twice daily times 7 days.  Dehydration: Patient was given 500 mL's of normal saline in addition to the fluid that he received with his cefepime dose.  Nausea and vomiting with intermittent confusion in a setting of a metastatic non-small cell lung cancer: Victor Huber was referred for an MRI of the brain which will be completed on 05/30/2017 before his follow-up with Dr. Julien Nordmann.  Hypothyroidism: He continues on Synthroid 225 mcg daily.  His last 2 TSH values have been higher.  2 weeks ago his TSH returned at 7.610.  Today it returned at 9.445.  He was instructed to increase his Synthroid to 237.5 mcg.  This information will be communicated to his primary care provider for further management as needed.  Please see After Visit Summary for patient specific instructions.  Future Appointments  Date Time Provider [place]  05/30/2017  7:00 AM [facility] 1 [facility] [place]  05/30/2017  9:45 AM Curt Bears, MD [facility] None    05/30/2017 11:00 AM [facility] G24 [facility] None  06/20/2017  9:45 AM [facility] LAB 4 [facility] None  06/20/2017 10:15 AM Curt Bears, MD [facility] None  06/20/2017 11:00 AM [facility] G24 [facility] None  07/11/2017  8:15 AM [facility] LAB 1 [facility] None  07/11/2017  8:45 AM Curt Bears, MD [facility] None  07/11/2017  9:45 AM [facility] D11 [facility] None  08/01/2017  8:30 AM [facility] LAB 2 [facility] None  08/01/2017  9:00 AM Curt Bears, MD [facility] None  08/01/2017 10:00 AM [facility] A2 [facility] None    Orders Placed This Encounter  Procedures  . MR Brain W Wo Contrast       Subjective:   Patient ID:  Victor Huber is a 67 y.o. (DOB 1950/10/25) male.  Chief Complaint:  Chief Complaint  Patient presents with  . Dizziness    HPI Victor Huber is a 67 year old male with a diagnosis of a metastatic non-small cell lung cancer who is managed by Dr. Eilleen Kempf.  He is currently treated with Alimta and Keytruda.  His last cycle of chemotherapy was given on 05/09/2017.  He continues to have intractable nausea despite his use of Ativan, Zofran, and Compazine.  In addition to this he continues to have episodic headaches, changes in balance, dizziness, episodic confusion, and recurrent falls.  He has had 5 falls in the last 3-4 weeks.  Additionally he has been having anorexia due to his nausea.  He is having difficulty eating and drinking secondary to this.  He continues to have episodic fevers and chills.  He reports  that his fever yesterday was 102.9.  His temperature today is 98.6.  His last brain MRI was completed on October 2018.  He continues on Synthroid at 225 mcg daily.  He reports that he has been on this dose for at least 2 years.  His TSH from today was 9.445.  This is up from 2.072 from last month.  Medications: I have reviewed the patient's current medications.  Allergies:  Allergies  Allergen Reactions  . Carboplatin  Itching, Nausea And Vomiting and Other (See Comments)    Flushing  . Flecainide Hypertension    CAUSED HEART ISSUES     Past Medical History:  Diagnosis Date  . Adenocarcinoma of left lung, stage 3 ([place]) 08/15/2016  . Arthritis   . Atrial fibrillation ([place]) 08/15/2016  . Atrial fibrillation ([place])   . COPD (chronic obstructive pulmonary disease) ([place]) 08/15/2016  . History of chemotherapy   . History of radiation therapy   . Hyperlipidemia   . Hypothyroid 08/15/2016  . Longstanding persistent atrial fibrillation ([place]) 08/29/2016  . Pathologic fracture    left femur  . Pneumonitis   . S/P TURP 08/15/2016  . Wears glasses   . Wears hearing aid in both ears     Past Surgical History:  Procedure Laterality Date  . BRONCHOSCOPY  10/2014  . CARDIAC CATHETERIZATION     05/07/12  . CARDIOVERSION     x2  . COLONOSCOPY    . DG BIOPSY LUNG Left 10/2014   FNA - Adenocarcinoma   . FEMUR IM NAIL Left 02/19/2017  . FEMUR IM NAIL Left 02/19/2017   Procedure: INTRAMEDULLARY (IM) NAIL FEMORAL;  Surgeon: Marchia Bond, MD;  Location: [facility];  Service: Orthopedics;  Laterality: Left;  . LUNG CANCER SURGERY Left 12/2014   Wedge Resection   . MULTIPLE TOOTH EXTRACTIONS    . Status post TURP    . TONSILLECTOMY      Family History  Problem Relation Age of Onset  . Breast cancer Mother   . Breast cancer Maternal Aunt   . Breast cancer Maternal Aunt   . Lung disease Neg Hx     Social History   Socioeconomic History  . Marital status: Married    Spouse name: Not on file  . Number of children: Not on file  . Years of education: Not on file  . Highest education level: Not on file  Social Needs  . Financial resource strain: Not on file  . Food insecurity - worry: Not on file  . Food insecurity - inability: Not on file  . Transportation needs - medical: Not on file  . Transportation needs - non-medical: Not on file  Occupational History  . Not on file  Tobacco Use  . Smoking status:  Former Smoker    Packs/day: 1.00    Years: 50.00    Pack years: 50.00    Types: Cigarettes    Last attempt to quit: 08/15/2012    Years since quitting: 4.7  . Smokeless tobacco: Never Used  Substance and Sexual Activity  . Alcohol use: No  . Drug use: No  . Sexual activity: Not on file  Other Topics Concern  . Not on file  Social History Narrative   [facility] (09/26/16):   Originally from [place]. Moved to [facility] February 2018. Always lived in [place]. Moved to be closer to children & grandchildren. No international travel. 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  No mold exposure. No bird exposure or hot tub exposure. Enjoys reading. Previously enjoyed wood working with domestic woods.     Past Medical History, Surgical history, Social history, and Family history were reviewed and updated as appropriate.   Please see review of systems for further details on the patient's review from today.   Review of Systems:  Review of Systems  Constitutional: Positive for appetite change, chills, fatigue and fever. Negative for activity change and diaphoresis.  HENT: Negative for trouble swallowing.   Eyes: Negative for visual disturbance.  Respiratory: Positive for cough. Negative for choking, chest tightness, shortness of breath and wheezing.   Cardiovascular: Negative for chest pain and leg swelling.  Gastrointestinal: Positive for nausea and vomiting. Negative for constipation and diarrhea.  Skin: Positive for rash.  Neurological: Positive for dizziness, weakness and headaches. Negative for syncope and speech difficulty.       Recurrent falls  Psychiatric/Behavioral: Positive for confusion.    Objective:   Physical Exam:  BP (!) 155/73 (BP Location: Right Arm, Patient Position: Sitting)   Pulse (!) 50   Temp 98.4 F (36.9 C) (Oral)   Resp 20   Ht 6\' 1"  (1.854 m)   Wt 238 lb 14.4 oz (108.4 kg)   SpO2 97%    BMI 31.52 kg/m  ECOG: 2  Physical Exam  Constitutional: No distress.  HENT:  Head: Normocephalic and atraumatic.  Mouth/Throat: Oropharynx is clear and moist. No oropharyngeal exudate.  Cardiovascular: Regular rhythm and normal heart sounds. Bradycardia present. Exam reveals no gallop and no friction rub.  No murmur heard. Pulmonary/Chest: Effort normal and breath sounds normal. No respiratory distress. He has no wheezes. He has no rales.  Skin: Skin is warm and dry. Rash noted. He is not diaphoretic.  Diffuse scaling and slightly erythematous rash over the left posterior forearm.    Lab Review:     Component Value Date/Time   NA 133 (L) 05/28/2017 1312   NA 136 04/04/2017 1141   K 4.4 05/28/2017 1312   K 4.4 04/04/2017 1141   CL 99 05/28/2017 1312   CO2 25 05/28/2017 1312   CO2 24 04/04/2017 1141   GLUCOSE 115 05/28/2017 1312   GLUCOSE 116 04/04/2017 1141   BUN 20 05/28/2017 1312   BUN 21.1 04/04/2017 1141   CREATININE 1.10 05/28/2017 1312   CREATININE 1.1 04/04/2017 1141   CALCIUM 9.1 05/28/2017 1312   CALCIUM 9.1 04/04/2017 1141   PROT 6.7 05/28/2017 1312   PROT 6.6 04/04/2017 1141   ALBUMIN 3.2 (L) 05/28/2017 1312   ALBUMIN 3.5 04/04/2017 1141   AST 68 (H) 05/28/2017 1312   AST 17 04/04/2017 1141   ALT 59 (H) 05/28/2017 1312   ALT 18 04/04/2017 1141   ALKPHOS 140 05/28/2017 1312   ALKPHOS 89 04/04/2017 1141   BILITOT 0.6 05/28/2017 1312   BILITOT 0.67 04/04/2017 1141   GFRNONAA >60 05/28/2017 1312   GFRAA >60 05/28/2017 1312       Component Value Date/Time   WBC 3.6 (L) 05/28/2017 1312   WBC 4.0 05/09/2017 0824   RBC 3.83 (L) 05/28/2017 1312   HGB 13.1 05/09/2017 0824   HGB 14.2 04/04/2017 1141   HCT 37.8 (L) 05/28/2017 1312   HCT 41.3 04/04/2017 1141   PLT 159 05/28/2017 1312   PLT 119 (L) 04/04/2017 1141   MCV 98.7 (H) 05/28/2017 1312   MCV 94.5 04/04/2017 1141   MCH 32.9 05/28/2017 1312   MCHC 33.3 05/28/2017 1312  RDW 18.6 (H) 05/28/2017 1312     RDW 14.6 04/04/2017 1141   LYMPHSABS 0.7 (L) 05/28/2017 1312   LYMPHSABS 0.9 04/04/2017 1141   MONOABS 0.5 05/28/2017 1312   MONOABS 0.3 04/04/2017 1141   EOSABS 0.4 05/28/2017 1312   EOSABS 0.0 04/04/2017 1141   BASOSABS 0.0 05/28/2017 1312   BASOSABS 0.0 04/04/2017 1141   This case was discussed with Dr. Julien Nordmann. He expressed agreement with my management of this patient.

## 2017-05-30 ENCOUNTER — Inpatient Hospital Stay: Payer: Medicare HMO

## 2017-05-30 ENCOUNTER — Ambulatory Visit (HOSPITAL_COMMUNITY)
Admission: RE | Admit: 2017-05-30 | Discharge: 2017-05-30 | Disposition: A | Discharge status: Home / Self Care | Payer: Medicare HMO | Source: Ambulatory Visit | Attending: Medical | Admitting: Medical

## 2017-05-30 ENCOUNTER — Other Ambulatory Visit: Payer: Medicare Other

## 2017-05-30 ENCOUNTER — Inpatient Hospital Stay (HOSPITAL_BASED_OUTPATIENT_CLINIC_OR_DEPARTMENT_OTHER): Payer: Medicare HMO | Admitting: Internal Medicine

## 2017-05-30 ENCOUNTER — Encounter: Payer: Self-pay | Admitting: Internal Medicine

## 2017-05-30 VITALS — BP 105/71 | HR 61 | Temp 97.6°F | Resp 18 | Ht 73.0 in | Wt 233.6 lb

## 2017-05-30 DIAGNOSIS — R6883 Chills (without fever): Secondary | ICD-10-CM | POA: Diagnosis not present

## 2017-05-30 DIAGNOSIS — Z923 Personal history of irradiation: Secondary | ICD-10-CM

## 2017-05-30 DIAGNOSIS — R410 Disorientation, unspecified: Secondary | ICD-10-CM

## 2017-05-30 DIAGNOSIS — C7989 Secondary malignant neoplasm of other specified sites: Secondary | ICD-10-CM | POA: Diagnosis not present

## 2017-05-30 DIAGNOSIS — R310 Gross hematuria: Secondary | ICD-10-CM | POA: Diagnosis not present

## 2017-05-30 DIAGNOSIS — E860 Dehydration: Secondary | ICD-10-CM | POA: Diagnosis not present

## 2017-05-30 DIAGNOSIS — Z902 Acquired absence of lung [part of]: Secondary | ICD-10-CM | POA: Diagnosis not present

## 2017-05-30 DIAGNOSIS — Z9221 Personal history of antineoplastic chemotherapy: Secondary | ICD-10-CM

## 2017-05-30 DIAGNOSIS — R112 Nausea with vomiting, unspecified: Secondary | ICD-10-CM | POA: Diagnosis not present

## 2017-05-30 DIAGNOSIS — Z5111 Encounter for antineoplastic chemotherapy: Secondary | ICD-10-CM

## 2017-05-30 DIAGNOSIS — Z5112 Encounter for antineoplastic immunotherapy: Secondary | ICD-10-CM | POA: Diagnosis not present

## 2017-05-30 DIAGNOSIS — Z79899 Other long term (current) drug therapy: Secondary | ICD-10-CM | POA: Diagnosis not present

## 2017-05-30 DIAGNOSIS — C3432 Malignant neoplasm of lower lobe, left bronchus or lung: Secondary | ICD-10-CM

## 2017-05-30 DIAGNOSIS — C7951 Secondary malignant neoplasm of bone: Secondary | ICD-10-CM | POA: Diagnosis not present

## 2017-05-30 DIAGNOSIS — C3492 Malignant neoplasm of unspecified part of left bronchus or lung: Secondary | ICD-10-CM

## 2017-05-30 MED ORDER — GADOBENATE DIMEGLUMINE 529 MG/ML IV SOLN
20.0000 mL | Freq: Once | INTRAVENOUS | Status: AC | PRN
  Administered 2017-05-30: 20 mL via INTRAVENOUS

## 2017-05-30 MED ORDER — METHYLPREDNISOLONE 4 MG PO TBPK: ORAL_TABLET | ORAL | 0 refills | Status: DC

## 2017-05-30 NOTE — Progress Notes (Signed)
[facility] [phone]   [phone]  OFFICE PROGRESS NOTE  [name], MD [address] [place] [address]  DIAGNOSIS: Metastatic non-small cell lung cancer initially diagnosed as stage IIIA (T2a, N2, M0) non-small cell lung cancer, poorly differentiated adenocarcinoma presented with left lower lobe lung mass in addition to mediastinal lymphadenopathy.  The patient was diagnosed with metastatic disease involving the left femur as well as left supraclavicular nodal metastases and right paratracheal lymphadenopathy in October 2018.  Biomarker Findings Microsatellite Status - MS-Stable Tumor Mutational Burden - TMB-Low (3 Muts/Mb) Genomic Findings For a complete list of the genes assayed, please refer to the Appendix. STK11 P25f*6 CDXIP3ApS50NLZsplice site 1767+3A>LDAXX E374* MLL2 [id number] NBN K23346f NOTCH2 [id number] splice site [id number]>ZDisease relevant genes with no reportable alterations: EGFR, KRAS, ALK, BRAF, MET, RET, ERBB2, ROS1   PDL1 expression 5%  PRIOR THERAPY: 1) status post wedge resection of the left lower lobe lung mass as well as AP window lymph node dissection but there was residual metastatic mediastinal lymphadenopathy that could not be resected. 2) a course of concurrent chemorad[place] carbopl[place]  3) status post palliative radiotherapy to the left femur metastatic bone disease.   CURRENT THERAPY: Systemic chemotherapy with carboplatin for AC of 5, Alimta 500 mg/M2 and Keytruda 200 mg IV every 3 weeks.  First dose March 07, 2017.  Carboplatin was discontinued during cycle #2 secondary to hypersensitivity reaction.  The patient is currently on treatment with pemetrexed and Keytruda.  Status post 2 cycles.  INTERVAL HISTORY: Victor Huber 69o. male returns to the clinic today for follow-up visit accompanied by his wife and son.  The  patient continues to complain of persistent nausea for long time after his systemic chemotherapy.  He is currently on Zofran and Compazine with no significant improvement in his nausea.  He also takes Ativan as needed.  He has been complaining of increasing fatigue and weakness as well as sleeping most of the time.  He is not currently on any pain medications.  He has low-grade fever but recent urinalysis was unremarkable.  He came to the clinic last week for evaluation and he received IV hydration with antiemetics.  He also had MRI of the brain performed earlier today and he is here for evaluation and discussion of his MRI results and recommendation regarding his condition.  MEDICAL HISTORY: Past Medical History:  Diagnosis Date  . Adenocarcinoma of left lung, stage 3 ([facility]/16/2018  . Arthritis   . Atrial fibrillation ([facility]/16/2018  . Atrial fibrillation ([facility] . COPD (chronic obstructive pulmonary disease) ([facility]/16/2018  . History of chemotherapy   . History of radiation therapy   . Hyperlipidemia   . Hypothyroid 08/15/2016  . Longstanding persistent atrial fibrillation ([facility]/30/2018  . Pathologic fracture    left femur  . Pneumonitis   . S/P TURP 08/15/2016  . Wears glasses   . Wears hearing aid in both ears     ALLERGIES:  is allergic to carboplatin and flecainide.  MEDICATIONS:  Current Outpatient Medications  Medication Sig Dispense Refill  . apixaban (ELIQUIS) 5 MG TABS tablet Take 5 mg by mouth 2 (two) times daily.    . aMarland Kitchenenolol (TENORMIN) 50 MG tablet Take 50 mg by mouth daily.     . aMarland Kitchenorvastatin (LIPITOR) 40 MG tablet Take 40 mg by mouth daily.    .Marland Kitchen  baclofen (LIORESAL) 10 MG tablet Take 1 tablet (10 mg total) by mouth 3 (three) times daily. As needed for muscle spasm 50 tablet 0  . betamethasone dipropionate (DIPROLENE) 0.05 % cream Apply topically 2 (two) times daily. Do not use on face 45 g 1  . cefUROXime (CEFTIN) 500 MG tablet Take 1 tablet (500 mg total) by  mouth 2 (two) times daily with a meal. 14 tablet 0  . citalopram (CELEXA) 40 MG tablet Take 40 mg by mouth daily.    Marland Kitchen dexamethasone (DECADRON) 4 MG tablet 4 mg p.o. twice daily the day before, day of and day after chemotherapy every 3 weeks 40 tablet 1  . folic acid (FOLVITE) 1 MG tablet Take 1 tablet (1 mg total) by mouth daily. 30 tablet 4  . ibuprofen (ADVIL,MOTRIN) 400 MG tablet Take 400 mg by mouth every 4 (four) hours as needed for moderate pain.    Marland Kitchen levothyroxine (SYNTHROID, LEVOTHROID) 200 MCG tablet Take 200 mcg by mouth daily before breakfast.    . levothyroxine (SYNTHROID, LEVOTHROID) 25 MCG tablet Take 25 mcg by mouth daily.    Marland Kitchen LORazepam (ATIVAN) 0.5 MG tablet Take 1 tablet (0.5 mg total) by mouth every 8 (eight) hours as needed (nausea). 30 tablet 0  . ondansetron (ZOFRAN) 4 MG tablet Take 1 tablet (4 mg total) by mouth every 8 (eight) hours as needed for nausea or vomiting. 30 tablet 0  . oxyCODONE (ROXICODONE) 5 MG immediate release tablet Take 1-2 tablets (5-10 mg total) by mouth every 4 (four) hours as needed for severe pain. (Patient not taking: Reported on 05/09/2017) 50 tablet 0  . prochlorperazine (COMPAZINE) 10 MG tablet Take 1 tablet (10 mg total) by mouth every 6 (six) hours as needed for nausea or vomiting. 30 tablet 0  . sennosides-docusate sodium (SENOKOT-S) 8.6-50 MG tablet Take 2 tablets by mouth daily. 30 tablet 1  . sulfamethoxazole-trimethoprim (BACTRIM DS,SEPTRA DS) 800-160 MG tablet Take 1 tablet by mouth 2 (two) times daily. 28 tablet 0  . tamsulosin (FLOMAX) 0.4 MG CAPS capsule Take 0.4 mg daily by mouth.     . zolpidem (AMBIEN) 5 MG tablet Take 5 mg by mouth at bedtime as needed for sleep.     No current facility-administered medications for this visit.     SURGICAL HISTORY:  Past Surgical History:  Procedure Laterality Date  . BRONCHOSCOPY  10/2014  . CARDIAC CATHETERIZATION     05/07/12  . CARDIOVERSION     x2  . COLONOSCOPY    . DG BIOPSY LUNG Left  10/2014   FNA - Adenocarcinoma   . FEMUR IM NAIL Left 02/19/2017  . FEMUR IM NAIL Left 02/19/2017   Procedure: INTRAMEDULLARY (IM) NAIL FEMORAL;  Surgeon: Marchia Bond, MD;  Location: [facility];  Service: Orthopedics;  Laterality: Left;  . LUNG CANCER SURGERY Left 12/2014   Wedge Resection   . MULTIPLE TOOTH EXTRACTIONS    . Status post TURP    . TONSILLECTOMY      REVIEW OF SYSTEMS:  Constitutional: positive for anorexia, fatigue and weight loss Eyes: negative Ears, nose, mouth, throat, and face: negative Respiratory: negative Cardiovascular: negative Gastrointestinal: positive for nausea Genitourinary:negative Integument/breast: negative Hematologic/lymphatic: negative Musculoskeletal:negative Neurological: negative Behavioral/Psych: negative Endocrine: negative Allergic/Immunologic: negative   PHYSICAL EXAMINATION: General appearance: alert, cooperative, fatigued and no distress Head: Normocephalic, without obvious abnormality, atraumatic Neck: no adenopathy, no JVD, supple, symmetrical, trachea midline and thyroid not enlarged, symmetric, no tenderness/mass/nodules Lymph nodes: Cervical, supraclavicular, and axillary  nodes normal. Resp: clear to auscultation bilaterally Back: symmetric, no curvature. ROM normal. No CVA tenderness. Cardio: regular rate and rhythm, S1, S2 normal, no murmur, click, rub or gallop GI: soft, non-tender; bowel sounds normal; no masses,  no organomegaly Extremities: extremities normal, atraumatic, no cyanosis or edema Neurologic: Alert and oriented X 3, normal strength and tone. Normal symmetric reflexes. Normal coordination and gait  ECOG PERFORMANCE STATUS: 1 - Symptomatic but completely ambulatory  Blood pressure 105/71, pulse 61, temperature 97.6 F (36.4 C), temperature source Oral, resp. rate 18, height 6' 1"  (1.854 m), weight 233 lb 9.6 oz (106 kg), SpO2 97 %.  LABORATORY DATA: Lab Results  Component Value Date   WBC 3.6 (L) 05/28/2017     HGB 13.1 05/09/2017   HCT 37.8 (L) 05/28/2017   MCV 98.7 (H) 05/28/2017   PLT 159 05/28/2017      Chemistry      Component Value Date/Time   NA 133 (L) 05/28/2017 1312   NA 136 04/04/2017 1141   K 4.4 05/28/2017 1312   K 4.4 04/04/2017 1141   CL 99 05/28/2017 1312   CO2 25 05/28/2017 1312   CO2 24 04/04/2017 1141   BUN 20 05/28/2017 1312   BUN 21.1 04/04/2017 1141   CREATININE 1.10 05/28/2017 1312   CREATININE 1.1 04/04/2017 1141      Component Value Date/Time   CALCIUM 9.1 05/28/2017 1312   CALCIUM 9.1 04/04/2017 1141   ALKPHOS 140 05/28/2017 1312   ALKPHOS 89 04/04/2017 1141   AST 68 (H) 05/28/2017 1312   AST 17 04/04/2017 1141   ALT 59 (H) 05/28/2017 1312   ALT 18 04/04/2017 1141   BILITOT 0.6 05/28/2017 1312   BILITOT 0.67 04/04/2017 1141       RADIOGRAPHIC STUDIES: Ct Chest W Contrast  Result Date: 05/07/2017 CLINICAL DATA:  Followup left lung adenocarcinoma. Undergoing chemotherapy. Restaging. Previous surgery and radiation therapy. EXAM: CT CHEST, ABDOMEN, AND PELVIS WITH CONTRAST TECHNIQUE: Multidetector CT imaging of the chest, abdomen and pelvis was performed following the standard protocol during bolus administration of intravenous contrast. CONTRAST:  138m ISOVUE-300 IOPAMIDOL (ISOVUE-300) INJECTION 61% COMPARISON:  PET-CT on 01/12/2017 FINDINGS: CT CHEST FINDINGS Cardiovascular: No acute findings. Aortic and coronary artery atherosclerosis. Mediastinum/Lymph Nodes: Mild right paratracheal lymphadenopathy is again seen, with largest lymph node on image 22/2 measuring 17 mm on image 22/2, compared to 11 mm previously. Subcarinal lymph node measures 18 mm compared to 16 mm previously. Previously seen left supraclavicular lymph node is no longer visualized. No hilar or axillary lymphadenopathy identified. Lungs/Pleura: Stable postsurgical and post radiation changes seen in left perihilar region. No suspicious pulmonary nodules or masses are identified. Moderate  emphysema again demonstrated. New tree-in-bud opacities are seen in the right middle lobe, consistent with infectious or inflammatory etiology. No evidence of pulmonary consolidation or pleural effusion. Musculoskeletal:  No suspicious bone lesions identified. CT ABDOMEN AND PELVIS FINDINGS Hepatobiliary: No masses identified. Multiple calcified gallstones are again seen, without evidence of cholecystitis or biliary ductal dilatation. Pancreas:  No mass or inflammatory changes. Spleen:  Within normal limits in size and appearance. Adrenals/Urinary tract: Normal adrenal glands. Stable small renal cysts. No masses or hydronephrosis. Stomach/Bowel: No evidence of obstruction, inflammatory process, or abnormal fluid collections. Vascular/Lymphatic: No pathologically enlarged lymph nodes identified. No abdominal aortic aneurysm. Aortic atherosclerosis. Reproductive:  No mass or other significant abnormality identified. Other:  None. Musculoskeletal:  No suspicious bone lesions identified. IMPRESSION: Slight increase in size of several right paratracheal and  subcarinal mediastinal lymph nodes. Previously seen mild left supraclavicular lymphadenopathy no longer visualized. No evidence of abdominal or pelvic metastatic disease. New right middle lobe tree-in-bud opacities, consistent with infectious or inflammatory process. Moderate emphysema. Cholelithiasis.  No radiographic evidence of cholecystitis. Aortic and coronary artery atherosclerosis. Electronically Signed   By: Earle Gell M.D.   On: 05/07/2017 11:27   Mr Victor Huber ZO Contrast  Result Date: 05/30/2017 CLINICAL DATA:  Nausea, vomiting, confusion, headaches, and multiple falls. Worsening symptoms for 5 weeks. History of non-small cell lung cancer. EXAM: MRI HEAD WITHOUT AND WITH CONTRAST TECHNIQUE: Multiplanar, multiecho pulse sequences of the brain and surrounding structures were obtained without and with intravenous contrast. CONTRAST:  23m MULTIHANCE  GADOBENATE DIMEGLUMINE 529 MG/ML IV SOLN COMPARISON:  01/29/2017 FINDINGS: Brain: There is no evidence of acute infarct, intracranial hemorrhage, mass, midline shift, or extra-axial fluid collection. Moderate cerebral atrophy is unchanged. No significant cerebral white matter disease is seen. No abnormal brain parenchymal or meningeal enhancement is seen to suggest metastatic disease. A tiny developmental venous anomaly is incidentally noted in the right frontal lobe. Vascular: Major intracranial vascular flow voids are preserved. Skull and upper cervical spine: Unremarkable bone marrow signal. Sinuses/Orbits: Unremarkable orbits. Unchanged small left maxillary sinus mucous retention cyst. Minimal mucosal thickening scattered throughout the paranasal sinuses without fluid. Small right mastoid effusion. Other: None. IMPRESSION: No evidence of intracranial metastases or acute abnormality. Electronically Signed   By: ALogan BoresM.D.   On: 05/30/2017 08:11   Ct Abdomen Pelvis W Contrast  Result Date: 05/07/2017 CLINICAL DATA:  Followup left lung adenocarcinoma. Undergoing chemotherapy. Restaging. Previous surgery and radiation therapy. EXAM: CT CHEST, ABDOMEN, AND PELVIS WITH CONTRAST TECHNIQUE: Multidetector CT imaging of the chest, abdomen and pelvis was performed following the standard protocol during bolus administration of intravenous contrast. CONTRAST:  1049mISOVUE-300 IOPAMIDOL (ISOVUE-300) INJECTION 61% COMPARISON:  PET-CT on 01/12/2017 FINDINGS: CT CHEST FINDINGS Cardiovascular: No acute findings. Aortic and coronary artery atherosclerosis. Mediastinum/Lymph Nodes: Mild right paratracheal lymphadenopathy is again seen, with largest lymph node on image 22/2 measuring 17 mm on image 22/2, compared to 11 mm previously. Subcarinal lymph node measures 18 mm compared to 16 mm previously. Previously seen left supraclavicular lymph node is no longer visualized. No hilar or axillary lymphadenopathy identified.  Lungs/Pleura: Stable postsurgical and post radiation changes seen in left perihilar region. No suspicious pulmonary nodules or masses are identified. Moderate emphysema again demonstrated. New tree-in-bud opacities are seen in the right middle lobe, consistent with infectious or inflammatory etiology. No evidence of pulmonary consolidation or pleural effusion. Musculoskeletal:  No suspicious bone lesions identified. CT ABDOMEN AND PELVIS FINDINGS Hepatobiliary: No masses identified. Multiple calcified gallstones are again seen, without evidence of cholecystitis or biliary ductal dilatation. Pancreas:  No mass or inflammatory changes. Spleen:  Within normal limits in size and appearance. Adrenals/Urinary tract: Normal adrenal glands. Stable small renal cysts. No masses or hydronephrosis. Stomach/Bowel: No evidence of obstruction, inflammatory process, or abnormal fluid collections. Vascular/Lymphatic: No pathologically enlarged lymph nodes identified. No abdominal aortic aneurysm. Aortic atherosclerosis. Reproductive:  No mass or other significant abnormality identified. Other:  None. Musculoskeletal:  No suspicious bone lesions identified. IMPRESSION: Slight increase in size of several right paratracheal and subcarinal mediastinal lymph nodes. Previously seen mild left supraclavicular lymphadenopathy no longer visualized. No evidence of abdominal or pelvic metastatic disease. New right middle lobe tree-in-bud opacities, consistent with infectious or inflammatory process. Moderate emphysema. Cholelithiasis.  No radiographic evidence of cholecystitis. Aortic and coronary artery atherosclerosis.  Electronically Signed   By: Earle Gell M.D.   On: 05/07/2017 11:27   Ct Femur Left W Contrast  Result Date: 05/07/2017 CLINICAL DATA:  Metastatic lung cancer to the left femur. EXAM: CT OF THE LOWER LEFT EXTREMITY WITH CONTRAST TECHNIQUE: Multidetector CT imaging of the lower left extremity was performed according to the  standard protocol following intravenous contrast administration. COMPARISON:  Radiographs dated 02/19/2017 CONTRAST:  138m ISOVUE-300 IOPAMIDOL (ISOVUE-300) INJECTION 61% FINDINGS: Bones/Joint/Cartilage Again noted is a lytic lesion of the distal left femoral shaft extending over a 12 cm distance. There is periosteal reaction around the tumor with two small full-thickness cortical defects. No pathologic fracture. Intramedullary rod and fixation screws in place. No visible soft tissue extension of tumor. Muscles and Tendons Normal. Soft tissues Atherosclerosis. IMPRESSION: Stable lytic metastasis in the left femoral shaft with periosteal reaction. No pathologic fracture. Electronically Signed   By: JLorriane ShireM.D.   On: 05/07/2017 10:18    ASSESSMENT AND PLAN: This is a 67years old white male with metastatic non-small cell lung cancer, adenocarcinoma with no actionable mutations and PDL 1 expression of 5% that was initially diagnosed as stage IIIa non-small cell lung cancer, adenocarcinoma status post left lower lobectomy with lymph node dissection followed by a course of concurrent chemoradiation completed in January 2016.  The patient had evidence for disease metastasis in October 2018 with metastatic disease to the left femur as well as left supraclavicular and right paratracheal lymph nodes. The patient is currently on systemic chemotherapy initially was with carboplatin, Alimta and Keytruda.  Carboplatin was discontinued secondary to hypersensitivity reaction starting from cycle #2.  He is currently on treatment with Alimta and Keytruda status post a total of 4 cycles. He has a rough time with the treatment even after discontinuing carboplatin with delayed nausea.  He also has lack of appetite and sleeping a lot. He had MRI of the brain performed earlier today to rule out any metastatic disease to the brain and the scan showed no concerning findings. I discussed the results with the patient and his  family.  I also discussed with him several options for management of his condition including discontinuation of his treatment versus taking a break of the chemotherapy for now versus proceeding with treatment with single agent Keytruda. After discussion of all the options, the patient and his family agreed to take a break of treatment for the next 3 weeks until improvement of his condition. He also developed a skin rash on the upper extremities in the last few days and I will start the patient on Medrol Dosepak. He will come back for follow-up visit in 3 weeks for reevaluation before resuming his treatment. He was advised to call immediately if he has any concerning symptoms in the interval. The patient voices understanding of current disease status and treatment options and is in agreement with the current care plan. All questions were answered. The patient knows to call the clinic with any problems, questions or concerns. We can certainly see the patient much sooner if necessary.  Disclaimer: This note was dictated with voice recognition software. Similar sounding words can inadvertently be transcribed and may not be corrected upon review.

## 2017-06-07 ENCOUNTER — Telehealth: Payer: Self-pay | Admitting: *Deleted

## 2017-06-07 DIAGNOSIS — C3492 Malignant neoplasm of unspecified part of left bronchus or lung: Secondary | ICD-10-CM

## 2017-06-07 DIAGNOSIS — Z5111 Encounter for antineoplastic chemotherapy: Secondary | ICD-10-CM

## 2017-06-07 DIAGNOSIS — J432 Centrilobular emphysema: Secondary | ICD-10-CM

## 2017-06-07 MED ORDER — FOLIC ACID 1 MG PO TABS: 1.0000 mg | ORAL_TABLET | Freq: Every day | ORAL | 4 refills | Status: DC

## 2017-06-07 NOTE — Telephone Encounter (Signed)
"  My husband needs a refill for Folic Acid.  We're changing pharmacies and need order sent to [facility] on [address]."

## 2017-06-10 ENCOUNTER — Other Ambulatory Visit: Payer: Self-pay | Admitting: Cardiovascular Disease

## 2017-06-10 DIAGNOSIS — C3492 Malignant neoplasm of unspecified part of left bronchus or lung: Secondary | ICD-10-CM

## 2017-06-10 DIAGNOSIS — C3490 Malignant neoplasm of unspecified part of unspecified bronchus or lung: Secondary | ICD-10-CM

## 2017-06-11 ENCOUNTER — Other Ambulatory Visit: Payer: Self-pay | Admitting: Medical

## 2017-06-11 MED ORDER — LEVOTHYROXINE SODIUM 25 MCG PO TABS: 37.5000 ug | ORAL_TABLET | Freq: Every day | ORAL | 1 refills | Status: DC

## 2017-06-11 MED ORDER — LEVOTHYROXINE SODIUM 200 MCG PO TABS: 200.0000 ug | ORAL_TABLET | Freq: Every day | ORAL | 1 refills | Status: DC

## 2017-06-11 NOTE — Telephone Encounter (Signed)
Please review for refill, Thanks !  

## 2017-06-12 ENCOUNTER — Other Ambulatory Visit: Payer: Self-pay | Admitting: Medical Oncology

## 2017-06-12 DIAGNOSIS — C3492 Malignant neoplasm of unspecified part of left bronchus or lung: Secondary | ICD-10-CM

## 2017-06-12 DIAGNOSIS — Z5111 Encounter for antineoplastic chemotherapy: Secondary | ICD-10-CM

## 2017-06-12 MED ORDER — FOLIC ACID 1 MG PO TABS: 1.0000 mg | ORAL_TABLET | Freq: Every day | ORAL | 4 refills | Status: DC

## 2017-06-20 ENCOUNTER — Inpatient Hospital Stay: Payer: Medicare HMO | Attending: Internal Medicine

## 2017-06-20 ENCOUNTER — Encounter: Payer: Self-pay | Admitting: Internal Medicine

## 2017-06-20 ENCOUNTER — Inpatient Hospital Stay: Payer: Medicare HMO

## 2017-06-20 ENCOUNTER — Inpatient Hospital Stay (HOSPITAL_BASED_OUTPATIENT_CLINIC_OR_DEPARTMENT_OTHER): Payer: Medicare HMO | Admitting: Internal Medicine

## 2017-06-20 ENCOUNTER — Telehealth: Payer: Self-pay | Admitting: Internal Medicine

## 2017-06-20 ENCOUNTER — Other Ambulatory Visit: Payer: Self-pay | Admitting: Medical Oncology

## 2017-06-20 VITALS — BP 111/66 | HR 99 | Temp 98.0°F | Resp 24 | Ht 73.0 in | Wt 237.6 lb

## 2017-06-20 DIAGNOSIS — Z79899 Other long term (current) drug therapy: Secondary | ICD-10-CM | POA: Diagnosis not present

## 2017-06-20 DIAGNOSIS — F419 Anxiety disorder, unspecified: Secondary | ICD-10-CM | POA: Diagnosis not present

## 2017-06-20 DIAGNOSIS — E785 Hyperlipidemia, unspecified: Secondary | ICD-10-CM | POA: Insufficient documentation

## 2017-06-20 DIAGNOSIS — C3492 Malignant neoplasm of unspecified part of left bronchus or lung: Secondary | ICD-10-CM

## 2017-06-20 DIAGNOSIS — R5382 Chronic fatigue, unspecified: Secondary | ICD-10-CM

## 2017-06-20 DIAGNOSIS — E039 Hypothyroidism, unspecified: Secondary | ICD-10-CM | POA: Diagnosis not present

## 2017-06-20 DIAGNOSIS — C7951 Secondary malignant neoplasm of bone: Secondary | ICD-10-CM | POA: Diagnosis not present

## 2017-06-20 DIAGNOSIS — I481 Persistent atrial fibrillation: Secondary | ICD-10-CM | POA: Insufficient documentation

## 2017-06-20 DIAGNOSIS — E032 Hypothyroidism due to medicaments and other exogenous substances: Secondary | ICD-10-CM

## 2017-06-20 DIAGNOSIS — Z5112 Encounter for antineoplastic immunotherapy: Secondary | ICD-10-CM | POA: Diagnosis not present

## 2017-06-20 DIAGNOSIS — C3432 Malignant neoplasm of lower lobe, left bronchus or lung: Secondary | ICD-10-CM | POA: Insufficient documentation

## 2017-06-20 DIAGNOSIS — Z902 Acquired absence of lung [part of]: Secondary | ICD-10-CM | POA: Diagnosis not present

## 2017-06-20 DIAGNOSIS — Z923 Personal history of irradiation: Secondary | ICD-10-CM | POA: Diagnosis not present

## 2017-06-20 DIAGNOSIS — J449 Chronic obstructive pulmonary disease, unspecified: Secondary | ICD-10-CM | POA: Insufficient documentation

## 2017-06-20 DIAGNOSIS — Z9221 Personal history of antineoplastic chemotherapy: Secondary | ICD-10-CM | POA: Insufficient documentation

## 2017-06-20 DIAGNOSIS — C770 Secondary and unspecified malignant neoplasm of lymph nodes of head, face and neck: Secondary | ICD-10-CM | POA: Diagnosis not present

## 2017-06-20 DIAGNOSIS — Z7189 Other specified counseling: Secondary | ICD-10-CM

## 2017-06-20 DIAGNOSIS — I878 Other specified disorders of veins: Secondary | ICD-10-CM

## 2017-06-20 LAB — CBC WITH DIFFERENTIAL/PLATELET
BASOS ABS: 0 10*3/uL (ref 0.0–0.1)
Basophils Relative: 0 %
Eosinophils Absolute: 0 10*3/uL (ref 0.0–0.5)
Eosinophils Relative: 0 %
HEMATOCRIT: 38.6 % (ref 38.4–49.9)
Hemoglobin: 13.1 g/dL (ref 13.0–17.1)
Lymphocytes Relative: 9 %
Lymphs Abs: 0.9 10*3/uL (ref 0.9–3.3)
MCH: 34.4 pg — ABNORMAL HIGH (ref 27.2–33.4)
MCHC: 33.9 g/dL (ref 32.0–36.0)
MCV: 101.4 fL — ABNORMAL HIGH (ref 79.3–98.0)
Monocytes Absolute: 0.4 10*3/uL (ref 0.1–0.9)
Monocytes Relative: 4 %
NEUTROS ABS: 8.9 10*3/uL — AB (ref 1.5–6.5)
Neutrophils Relative %: 87 %
PLATELETS: 162 10*3/uL (ref 140–400)
RBC: 3.81 MIL/uL — AB (ref 4.20–5.82)
RDW: 18.1 % — ABNORMAL HIGH (ref 11.0–14.6)
WBC: 10.1 10*3/uL (ref 4.0–10.3)

## 2017-06-20 LAB — COMPREHENSIVE METABOLIC PANEL
ALT: 15 U/L (ref 0–55)
ANION GAP: 10 (ref 3–11)
AST: 19 U/L (ref 5–34)
Albumin: 3.6 g/dL (ref 3.5–5.0)
Alkaline Phosphatase: 97 U/L (ref 40–150)
BILIRUBIN TOTAL: 0.8 mg/dL (ref 0.2–1.2)
BUN: 16 mg/dL (ref 7–26)
CO2: 21 mmol/L — ABNORMAL LOW (ref 22–29)
Calcium: 9.8 mg/dL (ref 8.4–10.4)
Chloride: 107 mmol/L (ref 98–109)
Creatinine, Ser: 0.83 mg/dL (ref 0.70–1.30)
Glucose, Bld: 131 mg/dL (ref 70–140)
Potassium: 4.2 mmol/L (ref 3.5–5.1)
Sodium: 138 mmol/L (ref 136–145)
TOTAL PROTEIN: 6.7 g/dL (ref 6.4–8.3)

## 2017-06-20 LAB — TSH: TSH: 0.565 u[IU]/mL (ref 0.320–4.118)

## 2017-06-20 MED ORDER — SODIUM CHLORIDE 0.9 % IV SOLN
Freq: Once | INTRAVENOUS | Status: AC
  Administered 2017-06-20: 12:00:00 via INTRAVENOUS

## 2017-06-20 MED ORDER — SODIUM CHLORIDE 0.9 % IV SOLN
200.0000 mg | Freq: Once | INTRAVENOUS | Status: AC
  Administered 2017-06-20: 200 mg via INTRAVENOUS
  Filled 2017-06-20: qty 8

## 2017-06-20 MED ORDER — SODIUM CHLORIDE 0.9 % IV SOLN: Freq: Once | INTRAVENOUS | Status: DC

## 2017-06-20 NOTE — Patient Instructions (Signed)
[facility] Discharge Instructions for Patients Receiving Chemotherapy  Today you received the following chemotherapy agents:  Keytruda.  To help prevent nausea and vomiting after your treatment, we encourage you to take your nausea medication as directed.   If you develop nausea and vomiting that is not controlled by your nausea medication, call the clinic.   BELOW ARE SYMPTOMS THAT SHOULD BE REPORTED IMMEDIATELY:  *FEVER GREATER THAN 100.5 F  *CHILLS WITH OR WITHOUT FEVER  NAUSEA AND VOMITING THAT IS NOT CONTROLLED WITH YOUR NAUSEA MEDICATION  *UNUSUAL SHORTNESS OF BREATH  *UNUSUAL BRUISING OR BLEEDING  TENDERNESS IN MOUTH AND THROAT WITH OR WITHOUT PRESENCE OF ULCERS  *URINARY PROBLEMS  *BOWEL PROBLEMS  UNUSUAL RASH Items with * indicate a potential emergency and should be followed up as soon as possible.  Feel free to call the clinic should you have any questions or concerns. The clinic phone number is [phone].  Please show the CHEMO ALERT CARD at check-in to the Emergency Department and triage nurse.    

## 2017-06-20 NOTE — Progress Notes (Signed)
[facility] [phone]   [phone]  OFFICE PROGRESS NOTE  [name], MD [address], [place] [address]  DIAGNOSIS: Metastatic non-small cell lung cancer initially diagnosed as stage IIIA (T2a, N2, M0) non-small cell lung cancer, poorly differentiated adenocarcinoma presented with left lower lobe lung mass in addition to mediastinal lymphadenopathy.  The patient was diagnosed with metastatic disease involving the left femur as well as left supraclavicular nodal metastases and right paratracheal lymphadenopathy in October 2018.  Biomarker Findings Microsatellite Status - MS-Stable Tumor Mutational Burden - TMB-Low (3 Muts/Mb) Genomic Findings For a complete list of the genes assayed, please refer to the Appendix. STK11 P27f*6 [id number] site 1102+1R>ZDAXX E374* MLL2 [id number] NBN [id number] NOTCH2 [id number] splice site [id number]+[id number]>UDisease relevant genes with no reportable alterations: EGFR, KRAS, ALK, BRAF, MET, RET, ERBB2, ROS1   PDL1 expression 5%  PRIOR THERAPY: 1) status post wedge resection of the left lower lobe lung mass as well as AP window lymph node dissection but there was residual metastatic mediastinal lymphadenopathy that could not be resected. 2) a course of concurrent chemoradiation [place] carboplatin [place]  3) status post palliative radiotherapy to the left femur metastatic bone disease.   CURRENT THERAPY: Systemic chemotherapy with carboplatin for AC of 5, Alimta 500 mg/M2 and Keytruda 200 mg IV every 3 weeks.  First dose March 07, 2017.  Carboplatin was discontinued during cycle #2 secondary to hypersensitivity reaction.  The patient is currently on treatment with pemetrexed and Keytruda.  Status post 2 cycles.  Pemetrexed was discontinued from cycle #5 secondary to intolerance.  INTERVAL HISTORY: Victor Huber 29o. male returns to the  clinic today for follow-up visit accompanied by his son.  The patient is feeling fine today with no specific complaints except for mild fatigue.  He enjoyed the last 3 weeks when he was off treatment.  He denied having any current chest pain, shortness of breath, cough or hemoptysis.  He denied having any fever or chills.  He has no nausea, vomiting, diarrhea or constipation.  He denied having any significant weight loss or night sweats.  He is here today for reevaluation before resuming his treatment.  MEDICAL HISTORY: Past Medical History:  Diagnosis Date  . Adenocarcinoma of left lung, stage 3 (HCC[facility]arion/16/2018  . Arthritis   . Atrial fibrillation ([facility]/16/2018  . Atrial fibrillation (HCCDupuyer . COPD (chronic obstructive pulmonary disease) (HCCRoberta/16/2018  . History of chemotherapy   . History of radiation therapy   . Hyperlipidemia   . Hypothyroid 08/15/2016  . Longstanding persistent atrial fibrillation (HCCHansen/30/2018  . Pathologic fracture    left femur  . Pneumonitis   . S/P TURP 08/15/2016  . Wears glasses   . Wears hearing aid in both ears     ALLERGIES:  is allergic to carboplatin and flecainide.  MEDICATIONS:  Current Outpatient Medications  Medication Sig Dispense Refill  . apixaban (ELIQUIS) 5 MG TABS tablet Take 5 mg by mouth 2 (two) times daily.    . aMarland Kitchenenolol (TENORMIN) 50 MG tablet TAKE 1 TABLET DAILY (DISCONTINUE METOPROLOL TART 50MG) 90 tablet 3  . atorvastatin (LIPITOR) 40 MG tablet Take 40 mg by mouth daily.    . baclofen (LIORESAL) 10 MG tablet Take 1 tablet (10 mg total) by mouth 3 (three) times daily. As needed for muscle spasm 50 tablet 0  . betamethasone dipropionate (DIPROLENE)  0.05 % cream Apply topically 2 (two) times daily. Do not use on face 45 g 1  . cefUROXime (CEFTIN) 500 MG tablet Take 1 tablet (500 mg total) by mouth 2 (two) times daily with a meal. 14 tablet 0  . citalopram (CELEXA) 40 MG tablet Take 40 mg by mouth daily.    Marland Kitchen dexamethasone  (DECADRON) 4 MG tablet 4 mg p.o. twice daily the day before, day of and day after chemotherapy every 3 weeks 40 tablet 1  . folic acid (FOLVITE) 1 MG tablet Take 1 tablet (1 mg total) by mouth daily. 30 tablet 4  . ibuprofen (ADVIL,MOTRIN) 400 MG tablet Take 400 mg by mouth every 4 (four) hours as needed for moderate pain.    Marland Kitchen levothyroxine (SYNTHROID, LEVOTHROID) 200 MCG tablet Take 1 tablet (200 mcg total) by mouth daily before breakfast. 90 tablet 1  . levothyroxine (SYNTHROID, LEVOTHROID) 25 MCG tablet Take 1.5 tablets (37.5 mcg total) by mouth daily. 135 tablet 1  . LORazepam (ATIVAN) 0.5 MG tablet Take 1 tablet (0.5 mg total) by mouth every 8 (eight) hours as needed (nausea). 30 tablet 0  . methylPREDNISolone (MEDROL DOSEPAK) 4 MG TBPK tablet Use as instructed 21 tablet 0  . ondansetron (ZOFRAN) 4 MG tablet Take 1 tablet (4 mg total) by mouth every 8 (eight) hours as needed for nausea or vomiting. 30 tablet 0  . oxyCODONE (ROXICODONE) 5 MG immediate release tablet Take 1-2 tablets (5-10 mg total) by mouth every 4 (four) hours as needed for severe pain. (Patient not taking: Reported on 05/09/2017) 50 tablet 0  . prochlorperazine (COMPAZINE) 10 MG tablet Take 1 tablet (10 mg total) by mouth every 6 (six) hours as needed for nausea or vomiting. 30 tablet 0  . sennosides-docusate sodium (SENOKOT-S) 8.6-50 MG tablet Take 2 tablets by mouth daily. 30 tablet 1  . sulfamethoxazole-trimethoprim (BACTRIM DS,SEPTRA DS) 800-160 MG tablet Take 1 tablet by mouth 2 (two) times daily. 28 tablet 0  . tamsulosin (FLOMAX) 0.4 MG CAPS capsule Take 0.4 mg daily by mouth.     . zolpidem (AMBIEN) 5 MG tablet Take 5 mg by mouth at bedtime as needed for sleep.     No current facility-administered medications for this visit.     SURGICAL HISTORY:  Past Surgical History:  Procedure Laterality Date  . BRONCHOSCOPY  10/2014  . CARDIAC CATHETERIZATION     05/07/12  . CARDIOVERSION     x2  . COLONOSCOPY    . DG  BIOPSY LUNG Left 10/2014   FNA - Adenocarcinoma   . FEMUR IM NAIL Left 02/19/2017  . FEMUR IM NAIL Left 02/19/2017   Procedure: INTRAMEDULLARY (IM) NAIL FEMORAL;  Surgeon: Marchia Bond, MD;  Location: [facility];  Service: Orthopedics;  Laterality: Left;  . LUNG CANCER SURGERY Left 12/2014   Wedge Resection   . MULTIPLE TOOTH EXTRACTIONS    . Status post TURP    . TONSILLECTOMY      REVIEW OF SYSTEMS:  Constitutional: positive for fatigue Eyes: negative Ears, nose, mouth, throat, and face: negative Respiratory: negative Cardiovascular: negative Gastrointestinal: negative Genitourinary:negative Integument/breast: negative Hematologic/lymphatic: negative Musculoskeletal:negative Neurological: negative Behavioral/Psych: negative Endocrine: negative Allergic/Immunologic: negative   PHYSICAL EXAMINATION: General appearance: alert, cooperative, fatigued and no distress Head: Normocephalic, without obvious abnormality, atraumatic Neck: no adenopathy, no JVD, supple, symmetrical, trachea midline and thyroid not enlarged, symmetric, no tenderness/mass/nodules Lymph nodes: Cervical, supraclavicular, and axillary nodes normal. Resp: clear to auscultation bilaterally Back: symmetric, no curvature. ROM normal.  No CVA tenderness. Cardio: regular rate and rhythm, S1, S2 normal, no murmur, click, rub or gallop GI: soft, non-tender; bowel sounds normal; no masses,  no organomegaly Extremities: extremities normal, atraumatic, no cyanosis or edema Neurologic: Alert and oriented X 3, normal strength and tone. Normal symmetric reflexes. Normal coordination and gait  ECOG PERFORMANCE STATUS: 1 - Symptomatic but completely ambulatory  Blood pressure 111/66, pulse 99, temperature 98 F (36.7 C), temperature source Oral, resp. rate (!) 24, height 6' 1"  (1.854 m), weight 237 lb 9.6 oz (107.8 kg), SpO2 96 %.  LABORATORY DATA: Lab Results  Component Value Date   WBC 10.1 06/20/2017   HGB 13.1  06/20/2017   HCT 38.6 06/20/2017   MCV 101.4 (H) 06/20/2017   PLT 162 06/20/2017      Chemistry      Component Value Date/Time   NA 138 06/20/2017 1003   NA 136 04/04/2017 1141   K 4.2 06/20/2017 1003   K 4.4 04/04/2017 1141   CL 107 06/20/2017 1003   CO2 21 (L) 06/20/2017 1003   CO2 24 04/04/2017 1141   BUN 16 06/20/2017 1003   BUN 21.1 04/04/2017 1141   CREATININE 0.83 06/20/2017 1003   CREATININE 1.10 05/28/2017 1312   CREATININE 1.1 04/04/2017 1141      Component Value Date/Time   CALCIUM 9.8 06/20/2017 1003   CALCIUM 9.1 04/04/2017 1141   ALKPHOS 97 06/20/2017 1003   ALKPHOS 89 04/04/2017 1141   AST 19 06/20/2017 1003   AST 68 (H) 05/28/2017 1312   AST 17 04/04/2017 1141   ALT 15 06/20/2017 1003   ALT 59 (H) 05/28/2017 1312   ALT 18 04/04/2017 1141   BILITOT 0.8 06/20/2017 1003   BILITOT 0.6 05/28/2017 1312   BILITOT 0.67 04/04/2017 1141       RADIOGRAPHIC STUDIES: Mr Jeri Cos SH Contrast  Result Date: 05/30/2017 CLINICAL DATA:  Nausea, vomiting, confusion, headaches, and multiple falls. Worsening symptoms for 5 weeks. History of non-small cell lung cancer. EXAM: MRI HEAD WITHOUT AND WITH CONTRAST TECHNIQUE: Multiplanar, multiecho pulse sequences of the brain and surrounding structures were obtained without and with intravenous contrast. CONTRAST:  23m MULTIHANCE GADOBENATE DIMEGLUMINE 529 MG/ML IV SOLN COMPARISON:  01/29/2017 FINDINGS: Brain: There is no evidence of acute infarct, intracranial hemorrhage, mass, midline shift, or extra-axial fluid collection. Moderate cerebral atrophy is unchanged. No significant cerebral white matter disease is seen. No abnormal brain parenchymal or meningeal enhancement is seen to suggest metastatic disease. A tiny developmental venous anomaly is incidentally noted in the right frontal lobe. Vascular: Major intracranial vascular flow voids are preserved. Skull and upper cervical spine: Unremarkable bone marrow signal. Sinuses/Orbits:  Unremarkable orbits. Unchanged small left maxillary sinus mucous retention cyst. Minimal mucosal thickening scattered throughout the paranasal sinuses without fluid. Small right mastoid effusion. Other: None. IMPRESSION: No evidence of intracranial metastases or acute abnormality. Electronically Signed   By: ALogan BoresM.D.   On: 05/30/2017 08:11    ASSESSMENT AND PLAN: This is a 67years old white male with metastatic non-small cell lung cancer, adenocarcinoma with no actionable mutations and PDL 1 expression of 5% that was initially diagnosed as stage IIIa non-small cell lung cancer, adenocarcinoma status post left lower lobectomy with lymph node dissection followed by a course of concurrent chemoradiation completed in January 2016.  The patient had evidence for disease metastasis in October 2018 with metastatic disease to the left femur as well as left supraclavicular and right paratracheal lymph nodes. The  patient is currently on systemic chemotherapy initially was with carboplatin, Alimta and Keytruda.  Carboplatin was discontinued secondary to hypersensitivity reaction starting from cycle #2.  He is currently on treatment with Alimta and Keytruda status post a total of 4 cycles. He has a rough time with the treatment even after discontinuing carboplatin with delayed nausea.  He also has lack of appetite and sleeping a lot. He did very well the last 3 weeks when he was off treatment. I had a lengthy discussion with the patient and his son today about his current condition and treatment options. I discussed with the patient discontinuing treatment with Alimta at this point and considering the patient for maintenance treatment with [facility] as a single agent.  He agreed to this plan and will start cycle #5 today with single agent Keytruda. I will see him back for follow-up visit in 3 weeks for evaluation before starting cycle #6. For the hypothyroidism, he will continue his treatment as recommended by  his primary care physician. He was advised to call immediately if he has any concerning symptoms in the interval. The patient voices understanding of current disease status and treatment options and is in agreement with the current care plan. All questions were answered. The patient knows to call the clinic with any problems, questions or concerns. We can certainly see the patient much sooner if necessary. I spent 15 minutes counseling the patient face to face. The total time spent in the appointment was 25 minutes.  Disclaimer: This note was dictated with voice recognition software. Similar sounding words can inadvertently be transcribed and may not be corrected upon review.

## 2017-06-20 NOTE — Telephone Encounter (Signed)
Scheduled appt per 3/21 los - patient to get an updated schedule next visit.

## 2017-06-20 NOTE — Addendum Note (Signed)
Addended by: Ardeen Garland on: 06/20/2017 12:04 PM   Modules accepted: Orders

## 2017-06-20 NOTE — Progress Notes (Signed)
Per Infusion nurse pt requests port a cath-order placed.

## 2017-07-02 ENCOUNTER — Other Ambulatory Visit: Payer: Self-pay | Admitting: Radiology

## 2017-07-03 ENCOUNTER — Ambulatory Visit (HOSPITAL_COMMUNITY)
Admission: RE | Admit: 2017-07-03 | Discharge: 2017-07-03 | Disposition: A | Discharge status: Home / Self Care | Payer: Medicare HMO | Source: Ambulatory Visit | Attending: Internal Medicine | Admitting: Internal Medicine

## 2017-07-03 ENCOUNTER — Encounter (HOSPITAL_COMMUNITY): Payer: Self-pay

## 2017-07-03 ENCOUNTER — Other Ambulatory Visit: Payer: Self-pay | Admitting: Internal Medicine

## 2017-07-03 DIAGNOSIS — Z79899 Other long term (current) drug therapy: Secondary | ICD-10-CM | POA: Insufficient documentation

## 2017-07-03 DIAGNOSIS — E039 Hypothyroidism, unspecified: Secondary | ICD-10-CM | POA: Insufficient documentation

## 2017-07-03 DIAGNOSIS — Z9889 Other specified postprocedural states: Secondary | ICD-10-CM | POA: Insufficient documentation

## 2017-07-03 DIAGNOSIS — J449 Chronic obstructive pulmonary disease, unspecified: Secondary | ICD-10-CM | POA: Insufficient documentation

## 2017-07-03 DIAGNOSIS — I481 Persistent atrial fibrillation: Secondary | ICD-10-CM | POA: Diagnosis not present

## 2017-07-03 DIAGNOSIS — C3492 Malignant neoplasm of unspecified part of left bronchus or lung: Secondary | ICD-10-CM | POA: Insufficient documentation

## 2017-07-03 DIAGNOSIS — E785 Hyperlipidemia, unspecified: Secondary | ICD-10-CM | POA: Insufficient documentation

## 2017-07-03 DIAGNOSIS — Z7902 Long term (current) use of antithrombotics/antiplatelets: Secondary | ICD-10-CM | POA: Insufficient documentation

## 2017-07-03 DIAGNOSIS — I878 Other specified disorders of veins: Secondary | ICD-10-CM

## 2017-07-03 DIAGNOSIS — Z888 Allergy status to other drugs, medicaments and biological substances status: Secondary | ICD-10-CM | POA: Diagnosis not present

## 2017-07-03 DIAGNOSIS — M1990 Unspecified osteoarthritis, unspecified site: Secondary | ICD-10-CM | POA: Insufficient documentation

## 2017-07-03 HISTORY — PX: IR FLUORO GUIDE PORT INSERTION RIGHT: IMG5741

## 2017-07-03 HISTORY — PX: IR US GUIDE VASC ACCESS RIGHT: IMG2390

## 2017-07-03 LAB — PROTIME-INR
INR: 0.98
PROTHROMBIN TIME: 12.9 s (ref 11.4–15.2)

## 2017-07-03 LAB — APTT: APTT: 28 s (ref 24–36)

## 2017-07-03 LAB — CBC
HEMATOCRIT: 42.7 % (ref 39.0–52.0)
HEMOGLOBIN: 14.9 g/dL (ref 13.0–17.0)
MCH: 35 pg — ABNORMAL HIGH (ref 26.0–34.0)
MCHC: 34.9 g/dL (ref 30.0–36.0)
MCV: 100.2 fL — ABNORMAL HIGH (ref 78.0–100.0)
Platelets: 157 10*3/uL (ref 150–400)
RBC: 4.26 MIL/uL (ref 4.22–5.81)
RDW: 14.4 % (ref 11.5–15.5)
WBC: 7.4 10*3/uL (ref 4.0–10.5)

## 2017-07-03 MED ORDER — CEFAZOLIN SODIUM-DEXTROSE 2-4 GM/100ML-% IV SOLN
INTRAVENOUS | Status: AC
  Administered 2017-07-03: 2 g via INTRAVENOUS
  Filled 2017-07-03: qty 100

## 2017-07-03 MED ORDER — SODIUM CHLORIDE 0.9 % IV SOLN: INTRAVENOUS | Status: DC

## 2017-07-03 MED ORDER — CEFAZOLIN SODIUM-DEXTROSE 2-4 GM/100ML-% IV SOLN
2.0000 g | Freq: Once | INTRAVENOUS | Status: AC
  Administered 2017-07-03: 2 g via INTRAVENOUS

## 2017-07-03 MED ORDER — HEPARIN SOD (PORK) LOCK FLUSH 100 UNIT/ML IV SOLN
INTRAVENOUS | Status: AC
  Filled 2017-07-03: qty 5

## 2017-07-03 MED ORDER — HEPARIN SOD (PORK) LOCK FLUSH 100 UNIT/ML IV SOLN
INTRAVENOUS | Status: AC | PRN
  Administered 2017-07-03: 500 [IU] via INTRAVENOUS

## 2017-07-03 MED ORDER — LIDOCAINE HCL (PF) 1 % IJ SOLN
INTRAMUSCULAR | Status: AC | PRN
  Administered 2017-07-03: 20 mL

## 2017-07-03 MED ORDER — LIDOCAINE HCL 1 % IJ SOLN
INTRAMUSCULAR | Status: AC
  Filled 2017-07-03: qty 20

## 2017-07-03 MED ORDER — MIDAZOLAM HCL 2 MG/2ML IJ SOLN
INTRAMUSCULAR | Status: AC
  Filled 2017-07-03: qty 4

## 2017-07-03 MED ORDER — MIDAZOLAM HCL 2 MG/2ML IJ SOLN
INTRAMUSCULAR | Status: AC | PRN
  Administered 2017-07-03: 1 mg via INTRAVENOUS
  Administered 2017-07-03: 2 mg via INTRAVENOUS
  Administered 2017-07-03: 1 mg via INTRAVENOUS

## 2017-07-03 MED ORDER — LIDOCAINE HCL (PF) 1 % IJ SOLN
INTRAMUSCULAR | Status: AC | PRN
  Administered 2017-07-03: 5 mL

## 2017-07-03 MED ORDER — FENTANYL CITRATE (PF) 100 MCG/2ML IJ SOLN
INTRAMUSCULAR | Status: AC | PRN
  Administered 2017-07-03 (×2): 50 ug via INTRAVENOUS

## 2017-07-03 MED ORDER — FENTANYL CITRATE (PF) 100 MCG/2ML IJ SOLN
INTRAMUSCULAR | Status: AC
  Filled 2017-07-03: qty 2

## 2017-07-03 NOTE — Procedures (Signed)
Interventional Radiology Procedure Note  Procedure: Single Lumen Power Port Placement    Access:  Right IJ vein.  Findings: Catheter tip positioned at SVC/RA junction. Port is ready for immediate use.   Complications: None  EBL: < 10 mL  Recommendations:  - Ok to shower in 24 hours - Do not submerge for 7 days - Routine line care   Harleen Fineberg T. Antjuan Rothe, M.D Pager:  [phone]   

## 2017-07-03 NOTE — Discharge Instructions (Signed)
Moderate Conscious Sedation, Adult, Care After °These instructions provide you with information about caring for yourself after your procedure. Your health care provider may also give you more specific instructions. Your treatment has been planned according to current medical practices, but problems sometimes occur. Call your health care provider if you have any problems or questions after your procedure. °What can I expect after the procedure? °After your procedure, it is common: °· To feel sleepy for several hours. °· To feel clumsy and have poor balance for several hours. °· To have poor judgment for several hours. °· To vomit if you eat too soon. ° °Follow these instructions at home: °For at least 24 hours after the procedure: ° °· Do not: °? Participate in activities where you could fall or become injured. °? Drive. °? Use heavy machinery. °? Drink alcohol. °? Take sleeping pills or medicines that cause drowsiness. °? Make important decisions or sign legal documents. °? Take care of children on your own. °· Rest. °Eating and drinking °· Follow the diet recommended by your health care provider. °· If you vomit: °? Drink water, juice, or soup when you can drink without vomiting. °? Make sure you have little or no nausea before eating solid foods. °General instructions °· Have a responsible adult stay with you until you are awake and alert. °· Take over-the-counter and prescription medicines only as told by your health care provider. °· If you smoke, do not smoke without supervision. °· Keep all follow-up visits as told by your health care provider. This is important. °Contact a health care provider if: °· You keep feeling nauseous or you keep vomiting. °· You feel light-headed. °· You develop a rash. °· You have a fever. °Get help right away if: °· You have trouble breathing. °This information is not intended to replace advice given to you by your health care provider. Make sure you discuss any questions you have  with your health care provider. °Document Released: 01/07/2013 Document Revised: 08/22/2015 Document Reviewed: 07/09/2015 °Elsevier Interactive Patient Education © 2018 [employer] ° ° °Implanted Port Insertion, Care After °This sheet gives you information about how to care for yourself after your procedure. Your health care provider may also give you more specific instructions. If you have problems or questions, contact your health care provider. °What can I expect after the procedure? °After your procedure, it is common to have: °· Discomfort at the port insertion site. °· Bruising on the skin over the port. This should improve over 3-4 days. ° °Follow these instructions at home: °Port care °· After your port is placed, you will get a manufacturer's information card. The card has information about your port. Keep this card with you at all times. °· Take care of the port as told by your health care provider. Ask your health care provider if you or a family member can get training for taking care of the port at home. A home health care nurse may also take care of the port. °· Make sure to remember what type of port you have. °Incision care °· Follow instructions from your health care provider about how to take care of your port insertion site. Make sure you: °? Wash your hands with soap and water before you change your bandage (dressing). If soap and water are not available, use hand sanitizer. °? Change your dressing as told by your health care provider.  You may remove your dressing tomorrow. °? Leave skin glue in place. These skin closures   may need to stay in place for 2 weeks or longer. If adhesive strip edges start to loosen and curl up, you may trim the loose edges. Do not remove adhesive strips completely unless your health care provider tells you to do that.  DO NOT use EMLA cream for 2 weeks after port placement as this cream will remove surgical glue covering your incision.  Check your port insertion  site every day for signs of infection. Check for: ? More redness, swelling, or pain. ? More fluid or blood. ? Warmth. ? Pus or a bad smell. General instructions  Do not take baths, swim, or use a hot tub until your health care provider approves.  You may Shower tomorrow.  Do not lift anything that is heavier than 10 lb (4.5 kg) for a week, or as told by your health care provider.  Ask your health care provider when it is okay to: ? Return to work or school. ? Resume usual physical activities or sports.  Do not drive for 24 hours if you were given a medicine to help you relax (sedative).  Take over-the-counter and prescription medicines only as told by your health care provider.  Wear a medical alert bracelet in case of an emergency. This will tell any health care providers that you have a port.  Keep all follow-up visits as told by your health care provider. This is important. Contact a health care provider if:  You have a fever or chills.  You have more redness, swelling, or pain around your port insertion site.  You have more fluid or blood coming from your port insertion site.  Your port insertion site feels warm to the touch.  You have pus or a bad smell coming from the port insertion site. Get help right away if:  You have chest pain or shortness of breath.  You have bleeding from your port that you cannot control. Summary  Take care of the port as told by your health care provider.  Change your dressing as told by your health care provider.  Keep all follow-up visits as told by your health care provider. This information is not intended to replace advice given to you by your health care provider. Make sure you discuss any questions you have with your health care provider. Document Released: 01/07/2013 Document Revised: 02/08/2016 Document Reviewed: 02/08/2016 Elsevier Interactive Patient Education  2017 [employer].

## 2017-07-03 NOTE — H&P (Signed)
Referring Physician(s): [name],[name]  Supervising Physician: Aletta Edouard  Patient Status:  [facility]  Chief Complaint:  "I'm here to have a port put in"  Subjective: Patient familiar to IR service from prior left supraclavicular lymph node biopsy on 01/28/17.  He has a history of metastatic adenocarcinoma of the left lung, status post surgery and chemoradiation.   He also has poor venous access and presents today for Port-A-Cath placement for additional treatment.  He has had prior left chest wall Port-A-Cath placed in [place] which was removed approximately 2 years ago.  He currently denies fever, headache, chest pain, dyspnea, cough, abdominal/back pain, nausea, vomiting or bleeding.  He is on Eliquis for atrial fibrillation. Past Medical History:  Diagnosis Date  . Adenocarcinoma of left lung, stage 3 ([place]) 08/15/2016  . Arthritis   . Atrial fibrillation ([place]) 08/15/2016  . Atrial fibrillation ([place])   . COPD (chronic obstructive pulmonary disease) ([place]) 08/15/2016  . History of chemotherapy   . History of radiation therapy   . Hyperlipidemia   . Hypothyroid 08/15/2016  . Longstanding persistent atrial fibrillation ([place]) 08/29/2016  . Pathologic fracture    left femur  . Pneumonitis   . S/P TURP 08/15/2016  . Wears glasses   . Wears hearing aid in both ears    Past Surgical History:  Procedure Laterality Date  . BRONCHOSCOPY  10/2014  . CARDIAC CATHETERIZATION     05/07/12  . CARDIOVERSION     x2  . COLONOSCOPY    . DG BIOPSY LUNG Left 10/2014   FNA - Adenocarcinoma   . FEMUR IM NAIL Left 02/19/2017  . FEMUR IM NAIL Left 02/19/2017   Procedure: INTRAMEDULLARY (IM) NAIL FEMORAL;  Surgeon: Marchia Bond, MD;  Location: [facility];  Service: Orthopedics;  Laterality: Left;  . LUNG CANCER SURGERY Left 12/2014   Wedge Resection   . MULTIPLE TOOTH EXTRACTIONS    . Status post TURP    . TONSILLECTOMY        Allergies: Carboplatin and Flecainide  Medications: Prior  to Admission medications   Medication Sig Start Date End Date Taking? Authorizing Provider  atenolol (TENORMIN) 50 MG tablet TAKE 1 TABLET DAILY (DISCONTINUE METOPROLOL TART 50MG ) 06/11/17  Yes Skeet Latch, MD  atorvastatin (LIPITOR) 40 MG tablet Take 40 mg by mouth daily.   Yes [provider]  citalopram (CELEXA) 40 MG tablet Take 40 mg by mouth daily.   Yes [provider]  folic acid (FOLVITE) 1 MG tablet Take 1 tablet (1 mg total) by mouth daily. 06/12/17  Yes Curt Bears, MD  ibuprofen (ADVIL,MOTRIN) 400 MG tablet Take 400 mg by mouth every 4 (four) hours as needed for moderate pain.   Yes [provider]  levothyroxine (SYNTHROID, LEVOTHROID) 200 MCG tablet Take 1 tablet (200 mcg total) by mouth daily before breakfast. 06/11/17  Yes Tanner, Lyndon Code., PA-C  levothyroxine (SYNTHROID, LEVOTHROID) 25 MCG tablet Take 1.5 tablets (37.5 mcg total) by mouth daily. 06/11/17  Yes Tanner, Lyndon Code., PA-C  tamsulosin (FLOMAX) 0.4 MG CAPS capsule Take 0.4 mg daily by mouth.    Yes [provider]  apixaban (ELIQUIS) 5 MG TABS tablet Take 5 mg by mouth 2 (two) times daily.    [provider]  baclofen (LIORESAL) 10 MG tablet Take 1 tablet (10 mg total) by mouth 3 (three) times daily. As needed for muscle spasm Patient not taking: Reported on 06/20/2017 02/19/17   Marchia Bond, MD  betamethasone dipropionate (DIPROLENE) 0.05 %  cream Apply topically 2 (two) times daily. Do not use on face 05/28/17   Harle Stanford., PA-C  dexamethasone (DECADRON) 4 MG tablet 4 mg p.o. twice daily the day before, day of and day after chemotherapy every 3 weeks 05/28/17   Curt Bears, MD  LORazepam (ATIVAN) 0.5 MG tablet Take 1 tablet (0.5 mg total) by mouth every 8 (eight) hours as needed (nausea). 05/14/17   Tanner, Lyndon Code., PA-C  ondansetron (ZOFRAN) 4 MG tablet Take 1 tablet (4 mg total) by mouth every 8 (eight) hours as needed for nausea or vomiting. Patient not taking:  Reported on 06/20/2017 05/14/17   Harle Stanford., PA-C  oxyCODONE (ROXICODONE) 5 MG immediate release tablet Take 1-2 tablets (5-10 mg total) by mouth every 4 (four) hours as needed for severe pain. Patient not taking: Reported on 05/09/2017 02/19/17   Marchia Bond, MD  prochlorperazine (COMPAZINE) 10 MG tablet Take 1 tablet (10 mg total) by mouth every 6 (six) hours as needed for nausea or vomiting. Patient not taking: Reported on 06/20/2017 02/28/17   Curt Bears, MD  sennosides-docusate sodium (SENOKOT-S) 8.6-50 MG tablet Take 2 tablets by mouth daily. Patient not taking: Reported on 06/20/2017 02/19/17   Marchia Bond, MD  zolpidem (AMBIEN) 5 MG tablet Take 5 mg by mouth at bedtime as needed for sleep.    [provider]     Vital Signs: BP 116/72   Pulse 81   Temp 98.7 F (37.1 C) (Oral)   Resp 18   SpO2 100%   Physical Exam awake, alert.  Chest clear to auscultation bilaterally.  Heart with irregularly irregular rhythm, normal rate.  Abdomen soft, positive bowel sounds, nontender.  No significant lower extremity edema.  Imaging: No results found.  Labs:  CBC: Recent Labs    04/18/17 0844 05/09/17 0824 05/14/17 1145 05/28/17 1312 06/20/17 1003 07/03/17 1006  WBC 8.6 4.0 9.1 3.6* 10.1 7.4  HGB 14.0 13.1  --   --  13.1 14.9  HCT 40.6 37.5* 39.6 37.8* 38.6 42.7  PLT 304 457* 282 159 162 157    COAGS: Recent Labs    01/28/17 1113 02/18/17 1609 07/03/17 1006  INR 0.99 1.07 0.98  APTT  --   --  28    BMP: Recent Labs    05/09/17 0824 05/14/17 1145 05/28/17 1312 06/20/17 1003  NA 141 137 133* 138  K 4.8 4.4 4.4 4.2  CL 106 101 99 107  CO2 23 21* 25 21*  GLUCOSE 153* 122 115 131  BUN 12 21 20 16   CALCIUM 9.7 9.4 9.1 9.8  CREATININE 0.86 0.96 1.10 0.83  GFRNONAA >60 >60 >60 >60  GFRAA >60 >60 >60 >60    LIVER FUNCTION TESTS: Recent Labs    05/09/17 0824 05/14/17 1145 05/28/17 1312 06/20/17 1003  BILITOT 0.5 2.4* 0.6 0.8  AST 23 27  68* 19  ALT 15 28 59* 15  ALKPHOS 94 94 140 97  PROT 7.0 7.3 6.7 6.7  ALBUMIN 3.0* 3.3* 3.2* 3.6    Assessment and Plan: Pt with history of metastatic adenocarcinoma of the left lung, status post surgery and chemoradiation.   He also has poor venous access and presents today for Port-A-Cath placement for additional treatment.  He has had prior left chest wall Port-A-Cath placed in [place] which was removed approximately 2 years ago. Risks and benefits of image guided port-a-catheter placement was discussed with the patient/son including, but not limited to bleeding, infection, pneumothorax, or fibrin  sheath development and need for additional procedures.  All of the patient's questions were answered, patient is agreeable to proceed. Consent signed and in chart.     Electronically Signed: D. Rowe Robert, PA-C 07/03/2017, 10:45 AM   I spent a total of 20 minutes at the the patient's bedside AND on the patient's hospital floor or unit, greater than 50% of which was counseling/coordinating care for Port-A-Cath placement

## 2017-07-11 ENCOUNTER — Inpatient Hospital Stay (HOSPITAL_BASED_OUTPATIENT_CLINIC_OR_DEPARTMENT_OTHER): Payer: Medicare HMO | Admitting: Internal Medicine

## 2017-07-11 ENCOUNTER — Inpatient Hospital Stay: Payer: Medicare HMO

## 2017-07-11 ENCOUNTER — Encounter: Payer: Self-pay | Admitting: Internal Medicine

## 2017-07-11 ENCOUNTER — Encounter: Payer: Self-pay | Admitting: *Deleted

## 2017-07-11 ENCOUNTER — Telehealth: Payer: Self-pay | Admitting: Internal Medicine

## 2017-07-11 ENCOUNTER — Inpatient Hospital Stay: Payer: Medicare HMO | Attending: Internal Medicine

## 2017-07-11 VITALS — BP 119/80 | HR 109 | Temp 97.9°F | Resp 18 | Ht 73.0 in | Wt 238.4 lb

## 2017-07-11 DIAGNOSIS — Z923 Personal history of irradiation: Secondary | ICD-10-CM | POA: Diagnosis not present

## 2017-07-11 DIAGNOSIS — C7951 Secondary malignant neoplasm of bone: Secondary | ICD-10-CM | POA: Insufficient documentation

## 2017-07-11 DIAGNOSIS — I481 Persistent atrial fibrillation: Secondary | ICD-10-CM | POA: Diagnosis not present

## 2017-07-11 DIAGNOSIS — C3492 Malignant neoplasm of unspecified part of left bronchus or lung: Secondary | ICD-10-CM

## 2017-07-11 DIAGNOSIS — J449 Chronic obstructive pulmonary disease, unspecified: Secondary | ICD-10-CM | POA: Diagnosis not present

## 2017-07-11 DIAGNOSIS — Z5112 Encounter for antineoplastic immunotherapy: Secondary | ICD-10-CM | POA: Diagnosis not present

## 2017-07-11 DIAGNOSIS — E039 Hypothyroidism, unspecified: Secondary | ICD-10-CM | POA: Insufficient documentation

## 2017-07-11 DIAGNOSIS — E785 Hyperlipidemia, unspecified: Secondary | ICD-10-CM | POA: Insufficient documentation

## 2017-07-11 DIAGNOSIS — C3490 Malignant neoplasm of unspecified part of unspecified bronchus or lung: Secondary | ICD-10-CM

## 2017-07-11 DIAGNOSIS — Z9221 Personal history of antineoplastic chemotherapy: Secondary | ICD-10-CM | POA: Diagnosis not present

## 2017-07-11 DIAGNOSIS — Z79899 Other long term (current) drug therapy: Secondary | ICD-10-CM | POA: Insufficient documentation

## 2017-07-11 DIAGNOSIS — C770 Secondary and unspecified malignant neoplasm of lymph nodes of head, face and neck: Secondary | ICD-10-CM | POA: Diagnosis not present

## 2017-07-11 DIAGNOSIS — Z902 Acquired absence of lung [part of]: Secondary | ICD-10-CM | POA: Diagnosis not present

## 2017-07-11 DIAGNOSIS — C3432 Malignant neoplasm of lower lobe, left bronchus or lung: Secondary | ICD-10-CM | POA: Insufficient documentation

## 2017-07-11 DIAGNOSIS — R5382 Chronic fatigue, unspecified: Secondary | ICD-10-CM

## 2017-07-11 LAB — CBC WITH DIFFERENTIAL/PLATELET
Basophils Absolute: 0 10*3/uL (ref 0.0–0.1)
Basophils Relative: 0 %
Eosinophils Absolute: 0 10*3/uL (ref 0.0–0.5)
Eosinophils Relative: 0 %
HEMATOCRIT: 40.1 % (ref 38.4–49.9)
Hemoglobin: 13.8 g/dL (ref 13.0–17.1)
LYMPHS PCT: 11 %
Lymphs Abs: 1.2 10*3/uL (ref 0.9–3.3)
MCH: 34.5 pg — ABNORMAL HIGH (ref 27.2–33.4)
MCHC: 34.4 g/dL (ref 32.0–36.0)
MCV: 100.3 fL — AB (ref 79.3–98.0)
MONO ABS: 0.3 10*3/uL (ref 0.1–0.9)
Monocytes Relative: 3 %
NEUTROS ABS: 9.2 10*3/uL — AB (ref 1.5–6.5)
Neutrophils Relative %: 86 %
Platelets: 177 10*3/uL (ref 140–400)
RBC: 4 MIL/uL — ABNORMAL LOW (ref 4.20–5.82)
RDW: 13.8 % (ref 11.0–14.6)
WBC: 10.8 10*3/uL — ABNORMAL HIGH (ref 4.0–10.3)

## 2017-07-11 LAB — COMPREHENSIVE METABOLIC PANEL
ALBUMIN: 3.7 g/dL (ref 3.5–5.0)
ALK PHOS: 88 U/L (ref 40–150)
ALT: 22 U/L (ref 0–55)
ANION GAP: 11 (ref 3–11)
AST: 21 U/L (ref 5–34)
BUN: 18 mg/dL (ref 7–26)
CALCIUM: 10.1 mg/dL (ref 8.4–10.4)
CO2: 22 mmol/L (ref 22–29)
Chloride: 105 mmol/L (ref 98–109)
Creatinine, Ser: 1.02 mg/dL (ref 0.70–1.30)
GFR calc Af Amer: >60 mL/min (ref >60)
GFR calc non Af Amer: >60 mL/min (ref >60)
GLUCOSE: 205 mg/dL — AB (ref 70–140)
POTASSIUM: 5.1 mmol/L (ref 3.5–5.1)
SODIUM: 138 mmol/L (ref 136–145)
Total Bilirubin: 0.6 mg/dL (ref 0.2–1.2)
Total Protein: 6.7 g/dL (ref 6.4–8.3)

## 2017-07-11 LAB — TSH: TSH: 0.099 u[IU]/mL — ABNORMAL LOW (ref 0.320–4.118)

## 2017-07-11 MED ORDER — SODIUM CHLORIDE 0.9% FLUSH
10.0000 mL | INTRAVENOUS | Status: DC | PRN
  Administered 2017-07-11: 10 mL
  Filled 2017-07-11: qty 10

## 2017-07-11 MED ORDER — HEPARIN SOD (PORK) LOCK FLUSH 100 UNIT/ML IV SOLN
500.0000 [IU] | Freq: Once | INTRAVENOUS | Status: AC | PRN
  Administered 2017-07-11: 500 [IU]
  Filled 2017-07-11: qty 5

## 2017-07-11 MED ORDER — SODIUM CHLORIDE 0.9 % IV SOLN
200.0000 mg | Freq: Once | INTRAVENOUS | Status: AC
  Administered 2017-07-11: 200 mg via INTRAVENOUS
  Filled 2017-07-11: qty 8

## 2017-07-11 MED ORDER — SODIUM CHLORIDE 0.9 % IV SOLN
Freq: Once | INTRAVENOUS | Status: AC
  Administered 2017-07-11: 10:00:00 via INTRAVENOUS

## 2017-07-11 NOTE — Progress Notes (Signed)
[facility] Telephone:([phone]   [phone]  OFFICE PROGRESS NOTE  [name], MD [address] [place] [address]  DIAGNOSIS: Metastatic non-small cell lung cancer initially diagnosed as stage IIIA (T2a, N2, M0) non-small cell lung cancer, poorly differentiated adenocarcinoma presented with left lower lobe lung mass in addition to mediastinal lymphadenopathy.  The patient was diagnosed with metastatic disease involving the left femur as well as left supraclavicular nodal metastases and right paratracheal lymphadenopathy in October 2018.  Biomarker Findings Microsatellite Status - MS-Stable Tumor Mutational Burden - TMB-Low (3 Muts/Mb) Genomic Findings For a complete list of the genes assayed, please refer to the Appendix. STK11 P280f*6 [id number] site 1284+1L>KDAXX E374* MLL2 [id number] NBN K23376f NOTCH2 [id number] splice site 988725+3G>UDisease relevant genes with no reportable alterations: EGFR, KRAS, ALK, BRAF, MET, RET, ERBB2, ROS1   PDL1 expression 5%  PRIOR THERAPY: 1) status post wedge resection of the left lower lobe lung mass as well as AP window lymph node dissection but there was residual metastatic mediastinal lymphadenopathy that could not be resected. 2) [place]  3) status post palliative radiotherapy to the left femur metastatic bone disease.   CURRENT THERAPY: Systemic chemotherapy with carboplatin for AC of 5, Alimta 500 mg/M2 and Keytruda 200 mg IV every 3 weeks.  First dose March 07, 2017.  Carboplatin was discontinued during cycle #2 secondary to hypersensitivity reaction.  The patient is currently on treatment with pemetrexed and Keytruda.  Status post 3 cycles.  Pemetrexed was discontinued from cycle #5 secondary to intolerance.  INTERVAL HISTORY: Victor Huber 67o. male returns to the  clinic today for follow-up visit.  The patient is feeling much better today with no specific complaints except for cold symptoms.  He denied having any current chest pain, shortness of breath but has mild cough with no hemoptysis.  He denied having any recent weight loss or night sweats.  He has no nausea, vomiting, diarrhea or constipation.  He denied having any fever or chills.  He tolerated the last cycle of his treatment with single agent Keytruda much better.  He is here today for evaluation before starting the second cycle of this treatment.  MEDICAL HISTORY: Past Medical History:  Diagnosis Date  . Adenocarcinoma of left lung, stage 3 ([facility]/16/2018  . Arthritis   . Atrial fibrillation ([facility]/16/2018  . Atrial fibrillation ([facility] [facility] . COPD (chronic obstructive pulmonary disease) ([facility]/16/2018  . History of chemotherapy   . History of radiation therapy   . Hyperlipidemia   . Hypothyroid 08/15/2016  . Longstanding persistent atrial fibrillation ([facility]/30/2018  . Pathologic fracture    left femur  . Pneumonitis   . S/P TURP 08/15/2016  . Wears glasses   . Wears hearing aid in both ears     ALLERGIES:  is allergic to carboplatin and flecainide.  MEDICATIONS:  Current Outpatient Medications  Medication Sig Dispense Refill  . apixaban (ELIQUIS) 5 MG TABS tablet Take 5 mg by mouth 2 (two) times daily.    . aMarland Kitchenenolol (TENORMIN) 50 MG tablet TAKE 1 TABLET DAILY (DISCONTINUE METOPROLOL TART 50MG) 90 tablet 3  . atorvastatin (LIPITOR) 40 MG tablet Take 40 mg by mouth daily.    . baclofen (LIORESAL) 10 MG tablet Take 1 tablet (10 mg total) by mouth 3 (three) times daily. As needed for muscle spasm  50 tablet 0  . betamethasone dipropionate (DIPROLENE) 0.05 % cream Apply topically 2 (two) times daily. Do not use on face 45 g 1  . citalopram (CELEXA) 40 MG tablet Take 40 mg by mouth daily.    Marland Kitchen dexamethasone (DECADRON) 4 MG tablet 4 mg p.o. twice daily the day before, day of and day after  chemotherapy every 3 weeks 40 tablet 1  . folic acid (FOLVITE) 1 MG tablet Take 1 tablet (1 mg total) by mouth daily. 30 tablet 4  . ibuprofen (ADVIL,MOTRIN) 400 MG tablet Take 400 mg by mouth every 4 (four) hours as needed for moderate pain.    Marland Kitchen levothyroxine (SYNTHROID, LEVOTHROID) 200 MCG tablet Take 1 tablet (200 mcg total) by mouth daily before breakfast. 90 tablet 1  . levothyroxine (SYNTHROID, LEVOTHROID) 25 MCG tablet Take 1.5 tablets (37.5 mcg total) by mouth daily. 135 tablet 1  . LORazepam (ATIVAN) 0.5 MG tablet Take 1 tablet (0.5 mg total) by mouth every 8 (eight) hours as needed (nausea). 30 tablet 0  . ondansetron (ZOFRAN) 4 MG tablet Take 1 tablet (4 mg total) by mouth every 8 (eight) hours as needed for nausea or vomiting. 30 tablet 0  . oxyCODONE (ROXICODONE) 5 MG immediate release tablet Take 1-2 tablets (5-10 mg total) by mouth every 4 (four) hours as needed for severe pain. 50 tablet 0  . prochlorperazine (COMPAZINE) 10 MG tablet Take 1 tablet (10 mg total) by mouth every 6 (six) hours as needed for nausea or vomiting. 30 tablet 0  . sennosides-docusate sodium (SENOKOT-S) 8.6-50 MG tablet Take 2 tablets by mouth daily. 30 tablet 1  . tamsulosin (FLOMAX) 0.4 MG CAPS capsule Take 0.4 mg daily by mouth.     . zolpidem (AMBIEN) 5 MG tablet Take 5 mg by mouth at bedtime as needed for sleep.     No current facility-administered medications for this visit.     SURGICAL HISTORY:  Past Surgical History:  Procedure Laterality Date  . BRONCHOSCOPY  10/2014  . CARDIAC CATHETERIZATION     05/07/12  . CARDIOVERSION     x2  . COLONOSCOPY    . DG BIOPSY LUNG Left 10/2014   FNA - Adenocarcinoma   . FEMUR IM NAIL Left 02/19/2017  . FEMUR IM NAIL Left 02/19/2017   Procedure: INTRAMEDULLARY (IM) NAIL FEMORAL;  Surgeon: Marchia Bond, MD;  Location: [facility];  Service: Orthopedics;  Laterality: Left;  . IR FLUORO GUIDE PORT INSERTION RIGHT  07/03/2017  . IR US GUIDE VASC ACCESS RIGHT   07/03/2017  . LUNG CANCER SURGERY Left 12/2014   Wedge Resection   . MULTIPLE TOOTH EXTRACTIONS    . Status post TURP    . TONSILLECTOMY      REVIEW OF SYSTEMS:  A comprehensive review of systems was negative except for: Constitutional: positive for fatigue Ears, nose, mouth, throat, and face: positive for nasal congestion Respiratory: positive for cough   PHYSICAL EXAMINATION: General appearance: alert, cooperative, fatigued and no distress Head: Normocephalic, without obvious abnormality, atraumatic Neck: no adenopathy, no JVD, supple, symmetrical, trachea midline and thyroid not enlarged, symmetric, no tenderness/mass/nodules Lymph nodes: Cervical, supraclavicular, and axillary nodes normal. Resp: clear to auscultation bilaterally Back: symmetric, no curvature. ROM normal. No CVA tenderness. Cardio: regular rate and rhythm, S1, S2 normal, no murmur, click, rub or gallop GI: soft, non-tender; bowel sounds normal; no masses,  no organomegaly Extremities: extremities normal, atraumatic, no cyanosis or edema  ECOG PERFORMANCE STATUS: 1 - Symptomatic but completely  ambulatory  Blood pressure 119/80, pulse (!) 109, temperature 97.9 F (36.6 C), temperature source Oral, resp. rate 18, height 6' 1" (1.854 m), weight 238 lb 6.4 oz (108.1 kg), SpO2 97 %.  LABORATORY DATA: Lab Results  Component Value Date   WBC 10.8 (H) 07/11/2017   HGB 13.8 07/11/2017   HCT 40.1 07/11/2017   MCV 100.3 (H) 07/11/2017   PLT 177 07/11/2017      Chemistry      Component Value Date/Time   NA 138 06/20/2017 1003   NA 136 04/04/2017 1141   K 4.2 06/20/2017 1003   K 4.4 04/04/2017 1141   CL 107 06/20/2017 1003   CO2 21 (L) 06/20/2017 1003   CO2 24 04/04/2017 1141   BUN 16 06/20/2017 1003   BUN 21.1 04/04/2017 1141   CREATININE 0.83 06/20/2017 1003   CREATININE 1.10 05/28/2017 1312   CREATININE 1.1 04/04/2017 1141      Component Value Date/Time   CALCIUM 9.8 06/20/2017 1003   CALCIUM 9.1  04/04/2017 1141   ALKPHOS 97 06/20/2017 1003   ALKPHOS 89 04/04/2017 1141   AST 19 06/20/2017 1003   AST 68 (H) 05/28/2017 1312   AST 17 04/04/2017 1141   ALT 15 06/20/2017 1003   ALT 59 (H) 05/28/2017 1312   ALT 18 04/04/2017 1141   BILITOT 0.8 06/20/2017 1003   BILITOT 0.6 05/28/2017 1312   BILITOT 0.67 04/04/2017 1141       RADIOGRAPHIC STUDIES: Ir US Guide Vasc Access Right  Result Date: 07/03/2017 CLINICAL DATA:  Metastatic adenocarcinoma of the left lung. Poor intravenous access. Need for porta cath for continued chemotherapy. EXAM: IMPLANTED PORT A CATH PLACEMENT WITH ULTRASOUND AND FLUOROSCOPIC GUIDANCE ANESTHESIA/SEDATION: 4.0 mg IV Versed; 100 mcg IV Fentanyl Total Moderate Sedation Time:  32 minutes The patient's level of consciousness and physiologic status were continuously monitored during the procedure by Radiology nursing. Additional Medications: 2 g IV Ancef. FLUOROSCOPY TIME:  36 seconds.  17.1 mGy. PROCEDURE: The procedure, risks, benefits, and alternatives were explained to the patient. Questions regarding the procedure were encouraged and answered. The patient understands and consents to the procedure. A time-out was performed prior to initiating the procedure. Ultrasound was utilized to confirm patency of the right internal jugular vein. The right neck and chest were prepped with chlorhexidine in a sterile fashion, and a sterile drape was applied covering the operative field. Maximum barrier sterile technique with sterile gowns and gloves were used for the procedure. Local anesthesia was provided with 1% lidocaine. After creating a small venotomy incision, a 21 gauge needle was advanced into the right internal jugular vein under direct, real-time ultrasound guidance. Ultrasound image documentation was performed. After securing guidewire access, an 8 Fr dilator was placed. A J-wire was kinked to measure appropriate catheter length. A subcutaneous port pocket was then created  along the upper chest wall utilizing sharp and blunt dissection. Portable cautery was utilized. The pocket was irrigated with sterile saline. A single lumen power injectable port was chosen for placement. The 8 Fr catheter was tunneled from the port pocket site to the venotomy incision. The port was placed in the pocket. External catheter was trimmed to appropriate length based on guidewire measurement. At the venotomy, an 8 Fr peel-away sheath was placed over a guidewire. The catheter was then placed through the sheath and the sheath removed. Final catheter positioning was confirmed and documented with a fluoroscopic spot image. The port was accessed with a needle and aspirated and  flushed with heparinized saline. The access needle was removed. The venotomy and port pocket incisions were closed with subcutaneous 3-0 Monocryl and subcuticular 4-0 Vicryl. Dermabond was applied to both incisions. COMPLICATIONS: COMPLICATIONS None FINDINGS: After catheter placement, the tip lies at the cavo-atrial junction. The catheter aspirates normally and is ready for immediate use. IMPRESSION: Placement of single lumen port a cath via right internal jugular vein. The catheter tip lies at the cavo-atrial junction. A power injectable port a cath was placed and is ready for immediate use. Electronically Signed   By: Aletta Edouard M.D.   On: 07/03/2017 13:53   Ir Fluoro Guide Port Insertion Right  Result Date: 07/03/2017 CLINICAL DATA:  Metastatic adenocarcinoma of the left lung. Poor intravenous access. Need for porta cath for continued chemotherapy. EXAM: IMPLANTED PORT A CATH PLACEMENT WITH ULTRASOUND AND FLUOROSCOPIC GUIDANCE ANESTHESIA/SEDATION: 4.0 mg IV Versed; 100 mcg IV Fentanyl Total Moderate Sedation Time:  32 minutes The patient's level of consciousness and physiologic status were continuously monitored during the procedure by Radiology nursing. Additional Medications: 2 g IV Ancef. FLUOROSCOPY TIME:  36 seconds.   17.1 mGy. PROCEDURE: The procedure, risks, benefits, and alternatives were explained to the patient. Questions regarding the procedure were encouraged and answered. The patient understands and consents to the procedure. A time-out was performed prior to initiating the procedure. Ultrasound was utilized to confirm patency of the right internal jugular vein. The right neck and chest were prepped with chlorhexidine in a sterile fashion, and a sterile drape was applied covering the operative field. Maximum barrier sterile technique with sterile gowns and gloves were used for the procedure. Local anesthesia was provided with 1% lidocaine. After creating a small venotomy incision, a 21 gauge needle was advanced into the right internal jugular vein under direct, real-time ultrasound guidance. Ultrasound image documentation was performed. After securing guidewire access, an 8 Fr dilator was placed. A J-wire was kinked to measure appropriate catheter length. A subcutaneous port pocket was then created along the upper chest wall utilizing sharp and blunt dissection. Portable cautery was utilized. The pocket was irrigated with sterile saline. A single lumen power injectable port was chosen for placement. The 8 Fr catheter was tunneled from the port pocket site to the venotomy incision. The port was placed in the pocket. External catheter was trimmed to appropriate length based on guidewire measurement. At the venotomy, an 8 Fr peel-away sheath was placed over a guidewire. The catheter was then placed through the sheath and the sheath removed. Final catheter positioning was confirmed and documented with a fluoroscopic spot image. The port was accessed with a needle and aspirated and flushed with heparinized saline. The access needle was removed. The venotomy and port pocket incisions were closed with subcutaneous 3-0 Monocryl and subcuticular 4-0 Vicryl. Dermabond was applied to both incisions. COMPLICATIONS: COMPLICATIONS None  FINDINGS: After catheter placement, the tip lies at the cavo-atrial junction. The catheter aspirates normally and is ready for immediate use. IMPRESSION: Placement of single lumen port a cath via right internal jugular vein. The catheter tip lies at the cavo-atrial junction. A power injectable port a cath was placed and is ready for immediate use. Electronically Signed   By: Aletta Edouard M.D.   On: 07/03/2017 13:53    ASSESSMENT AND PLAN: This is a 67 years old white male with metastatic non-small cell lung cancer, adenocarcinoma with no actionable mutations and PDL 1 expression of 5% that was initially diagnosed as stage IIIa non-small cell lung cancer, adenocarcinoma  status post left lower lobectomy with lymph node dissection followed by a course of concurrent chemoradiation completed in January 2016.  The patient had evidence for disease metastasis in October 2018 with metastatic disease to the left femur as well as left supraclavicular and right paratracheal lymph nodes. The patient is currently on systemic chemotherapy initially was with carboplatin, Alimta and Keytruda.  Carboplatin was discontinued secondary to hypersensitivity reaction starting from cycle #2.  He is currently on treatment with Alimta and Keytruda status post a total of 4 cycles. He was not tolerating the combination treatment of Alimta and Keytruda fairly well.  The patient was started on single agent Keytruda from cycle #5.  He tolerated the last cycle very well. I recommended for him to continue his current treatment with single agent Keytruda every 3 weeks.  He will proceed with cycle #6 today. I will see him back for follow-up visit in 3 weeks for evaluation after repeating CT scan of the chest, abdomen and pelvis for restaging of his disease. For the hypothyroidism, he will continue his treatment as recommended by his primary care physician. The patient was advised to call immediately if he has any concerning symptoms in the  interval. The patient voices understanding of current disease status and treatment options and is in agreement with the current care plan. All questions were answered. The patient knows to call the clinic with any problems, questions or concerns. We can certainly see the patient much sooner if necessary. I spent 10 minutes counseling the patient face to face. The total time spent in the appointment was 15 minutes.  Disclaimer: This note was dictated with voice recognition software. Similar sounding words can inadvertently be transcribed and may not be corrected upon review.

## 2017-07-11 NOTE — Progress Notes (Signed)
Oncology Nurse Navigator Documentation  Oncology Nurse Navigator Flowsheets 07/11/2017  Navigator Location [facility]  Navigator Encounter Type Clinic/MDC/I spoke with patient and family at clinic today.  He is feeling better as of recent but has cold like symptoms. Dr. Julien Nordmann is aware of these symptoms. I identified an educational barrier with treatment. I explained treatment plan and followed up. He verbalized understanding.   Patient Visit Type MedOnc  Treatment Phase Treatment  Barriers/Navigation Needs Education  Education Other  Interventions Education  Education Method Verbal  Acuity Level 1  Time Spent with Patient 30

## 2017-07-11 NOTE — Telephone Encounter (Signed)
Scheduled appt per 4/11 los - Gave patient AVs and calender per los.

## 2017-07-11 NOTE — Patient Instructions (Signed)

## 2017-07-22 ENCOUNTER — Telehealth: Payer: Self-pay | Admitting: Medical Oncology

## 2017-07-22 NOTE — Telephone Encounter (Signed)
I asked pt to call back and clarify what he needs regarding contrast for CT scan.

## 2017-07-23 NOTE — Telephone Encounter (Signed)
I told pt to pick up contrast at our scheduling.

## 2017-07-30 ENCOUNTER — Ambulatory Visit (HOSPITAL_COMMUNITY)
Admission: RE | Admit: 2017-07-30 | Discharge: 2017-07-30 | Disposition: A | Discharge status: Home / Self Care | Payer: Medicare HMO | Source: Ambulatory Visit | Attending: Internal Medicine | Admitting: Internal Medicine

## 2017-07-30 ENCOUNTER — Encounter (HOSPITAL_COMMUNITY): Payer: Self-pay

## 2017-07-30 DIAGNOSIS — K5730 Diverticulosis of large intestine without perforation or abscess without bleeding: Secondary | ICD-10-CM | POA: Insufficient documentation

## 2017-07-30 DIAGNOSIS — J439 Emphysema, unspecified: Secondary | ICD-10-CM | POA: Diagnosis not present

## 2017-07-30 DIAGNOSIS — N281 Cyst of kidney, acquired: Secondary | ICD-10-CM | POA: Insufficient documentation

## 2017-07-30 DIAGNOSIS — I700 Atherosclerosis of aorta: Secondary | ICD-10-CM | POA: Diagnosis not present

## 2017-07-30 DIAGNOSIS — K8020 Calculus of gallbladder without cholecystitis without obstruction: Secondary | ICD-10-CM | POA: Diagnosis not present

## 2017-07-30 DIAGNOSIS — C3490 Malignant neoplasm of unspecified part of unspecified bronchus or lung: Secondary | ICD-10-CM

## 2017-07-30 MED ORDER — HEPARIN SOD (PORK) LOCK FLUSH 100 UNIT/ML IV SOLN
500.0000 [IU] | Freq: Once | INTRAVENOUS | Status: AC
  Administered 2017-07-30: 500 [IU]

## 2017-07-30 MED ORDER — HEPARIN SOD (PORK) LOCK FLUSH 100 UNIT/ML IV SOLN
INTRAVENOUS | Status: AC
  Filled 2017-07-30: qty 5

## 2017-07-30 MED ORDER — IOHEXOL 300 MG/ML  SOLN
100.0000 mL | Freq: Once | INTRAMUSCULAR | Status: AC | PRN
  Administered 2017-07-30: 100 mL via INTRAVENOUS

## 2017-08-01 ENCOUNTER — Telehealth: Payer: Self-pay | Admitting: Internal Medicine

## 2017-08-01 ENCOUNTER — Encounter: Payer: Self-pay | Admitting: Internal Medicine

## 2017-08-01 ENCOUNTER — Inpatient Hospital Stay: Payer: Medicare HMO | Attending: Internal Medicine

## 2017-08-01 ENCOUNTER — Inpatient Hospital Stay: Payer: Medicare HMO

## 2017-08-01 ENCOUNTER — Inpatient Hospital Stay (HOSPITAL_BASED_OUTPATIENT_CLINIC_OR_DEPARTMENT_OTHER): Payer: Medicare HMO | Admitting: Internal Medicine

## 2017-08-01 VITALS — BP 124/70 | HR 98 | Temp 98.2°F | Resp 18 | Wt 238.2 lb

## 2017-08-01 DIAGNOSIS — Z902 Acquired absence of lung [part of]: Secondary | ICD-10-CM | POA: Diagnosis not present

## 2017-08-01 DIAGNOSIS — C3432 Malignant neoplasm of lower lobe, left bronchus or lung: Secondary | ICD-10-CM | POA: Diagnosis not present

## 2017-08-01 DIAGNOSIS — C3492 Malignant neoplasm of unspecified part of left bronchus or lung: Secondary | ICD-10-CM

## 2017-08-01 DIAGNOSIS — R5382 Chronic fatigue, unspecified: Secondary | ICD-10-CM

## 2017-08-01 DIAGNOSIS — J449 Chronic obstructive pulmonary disease, unspecified: Secondary | ICD-10-CM | POA: Diagnosis not present

## 2017-08-01 DIAGNOSIS — E785 Hyperlipidemia, unspecified: Secondary | ICD-10-CM | POA: Insufficient documentation

## 2017-08-01 DIAGNOSIS — Z923 Personal history of irradiation: Secondary | ICD-10-CM

## 2017-08-01 DIAGNOSIS — I481 Persistent atrial fibrillation: Secondary | ICD-10-CM | POA: Insufficient documentation

## 2017-08-01 DIAGNOSIS — E039 Hypothyroidism, unspecified: Secondary | ICD-10-CM | POA: Insufficient documentation

## 2017-08-01 DIAGNOSIS — J441 Chronic obstructive pulmonary disease with (acute) exacerbation: Secondary | ICD-10-CM

## 2017-08-01 DIAGNOSIS — Z5112 Encounter for antineoplastic immunotherapy: Secondary | ICD-10-CM | POA: Insufficient documentation

## 2017-08-01 DIAGNOSIS — C770 Secondary and unspecified malignant neoplasm of lymph nodes of head, face and neck: Secondary | ICD-10-CM

## 2017-08-01 DIAGNOSIS — Z79899 Other long term (current) drug therapy: Secondary | ICD-10-CM | POA: Diagnosis not present

## 2017-08-01 DIAGNOSIS — E032 Hypothyroidism due to medicaments and other exogenous substances: Secondary | ICD-10-CM

## 2017-08-01 DIAGNOSIS — Z9221 Personal history of antineoplastic chemotherapy: Secondary | ICD-10-CM

## 2017-08-01 DIAGNOSIS — C7951 Secondary malignant neoplasm of bone: Secondary | ICD-10-CM | POA: Diagnosis not present

## 2017-08-01 LAB — COMPREHENSIVE METABOLIC PANEL
ALT: 15 U/L (ref 0–55)
AST: 18 U/L (ref 5–34)
Albumin: 3.8 g/dL (ref 3.5–5.0)
Alkaline Phosphatase: 79 U/L (ref 40–150)
Anion gap: 8 (ref 3–11)
BUN: 13 mg/dL (ref 7–26)
CO2: 24 mmol/L (ref 22–29)
CREATININE: 0.85 mg/dL (ref 0.70–1.30)
Calcium: 9.6 mg/dL (ref 8.4–10.4)
Chloride: 108 mmol/L (ref 98–109)
GFR calc Af Amer: >60 mL/min (ref >60)
Glucose, Bld: 112 mg/dL (ref 70–140)
POTASSIUM: 4.2 mmol/L (ref 3.5–5.1)
Sodium: 140 mmol/L (ref 136–145)
TOTAL PROTEIN: 6.4 g/dL (ref 6.4–8.3)
Total Bilirubin: 0.6 mg/dL (ref 0.2–1.2)

## 2017-08-01 LAB — CBC WITH DIFFERENTIAL/PLATELET
BASOS ABS: 0 10*3/uL (ref 0.0–0.1)
Basophils Relative: 0 %
EOS ABS: 0.2 10*3/uL (ref 0.0–0.5)
EOS PCT: 2 %
HCT: 40.2 % (ref 38.4–49.9)
Hemoglobin: 13.9 g/dL (ref 13.0–17.1)
Lymphocytes Relative: 23 %
Lymphs Abs: 1.4 10*3/uL (ref 0.9–3.3)
MCH: 33.9 pg — ABNORMAL HIGH (ref 27.2–33.4)
MCHC: 34.6 g/dL (ref 32.0–36.0)
MCV: 98 fL (ref 79.3–98.0)
Monocytes Absolute: 0.6 10*3/uL (ref 0.1–0.9)
Monocytes Relative: 10 %
Neutro Abs: 4 10*3/uL (ref 1.5–6.5)
Neutrophils Relative %: 65 %
PLATELETS: 181 10*3/uL (ref 140–400)
RBC: 4.1 MIL/uL — AB (ref 4.20–5.82)
RDW: 13.4 % (ref 11.0–14.6)
WBC: 6.2 10*3/uL (ref 4.0–10.3)

## 2017-08-01 LAB — TSH

## 2017-08-01 MED ORDER — SODIUM CHLORIDE 0.9 % IV SOLN
200.0000 mg | Freq: Once | INTRAVENOUS | Status: AC
  Administered 2017-08-01: 200 mg via INTRAVENOUS
  Filled 2017-08-01: qty 8

## 2017-08-01 MED ORDER — HEPARIN SOD (PORK) LOCK FLUSH 100 UNIT/ML IV SOLN
500.0000 [IU] | Freq: Once | INTRAVENOUS | Status: AC | PRN
  Administered 2017-08-01: 500 [IU]
  Filled 2017-08-01: qty 5

## 2017-08-01 MED ORDER — SODIUM CHLORIDE 0.9 % IV SOLN: Freq: Once | INTRAVENOUS | Status: DC

## 2017-08-01 MED ORDER — SODIUM CHLORIDE 0.9% FLUSH
10.0000 mL | INTRAVENOUS | Status: DC | PRN
Start: 2017-08-01 — End: 2017-08-01
  Administered 2017-08-01: 10 mL
  Filled 2017-08-01: qty 10

## 2017-08-01 MED ORDER — SODIUM CHLORIDE 0.9 % IV SOLN
Freq: Once | INTRAVENOUS | Status: AC
  Administered 2017-08-01: 10:00:00 via INTRAVENOUS
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
## 2017-08-01 NOTE — Progress Notes (Signed)
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 Patient reports use of tylenol to relieve the pain. Pain occurs when patient straightens out leg. Patient wanted to make MD aware "incase something needs to be done". Pain developed within the last month per patient.

## 2017-08-01 NOTE — Patient Instructions (Signed)
Implanted Port Home Guide An implanted port is a type of central line that is placed under the skin. Central lines are used to provide IV access when treatment or nutrition needs to be given through a person's veins. Implanted ports are used for long-term IV access. An implanted port may be placed because:  You need IV medicine that would be irritating to the small veins in your hands or arms.  You need long-term IV medicines, such as antibiotics.  You need IV nutrition for a long period.  You need frequent blood draws for lab tests.  You need dialysis.  Implanted ports are usually placed in the chest area, but they can also be placed in the upper arm, the abdomen, or the leg. An implanted port has two main parts:  Reservoir. The reservoir is round and will appear as a small, raised area under your skin. The reservoir is the part where a needle is inserted to give medicines or draw blood.  Catheter. The catheter is a thin, flexible tube that extends from the reservoir. The catheter is placed into a large vein. Medicine that is inserted into the reservoir goes into the catheter and then into the vein.  How will I care for my incision site? Do not get the incision site wet. Bathe or shower as directed by your health care provider. How is my port accessed? Special steps must be taken to access the port:  Before the port is accessed, a numbing cream can be placed on the skin. This helps numb the skin over the port site.  Your health care provider uses a sterile technique to access the port. ? Your health care provider must put on a mask and sterile gloves. ? The skin over your port is cleaned carefully with an antiseptic and allowed to dry. ? The port is gently pinched between sterile gloves, and a needle is inserted into the port.  Only "non-coring" port needles should be used to access the port. Once the port is accessed, a blood return should be checked. This helps ensure that the port  is in the vein and is not clogged.  If your port needs to remain accessed for a constant infusion, a clear (transparent) bandage will be placed over the needle site. The bandage and needle will need to be changed every week, or as directed by your health care provider.  Keep the bandage covering the needle clean and dry. Do not get it wet. Follow your health care provider's instructions on how to take a shower or bath while the port is accessed.  If your port does not need to stay accessed, no bandage is needed over the port.  What is flushing? Flushing helps keep the port from getting clogged. Follow your health care provider's instructions on how and when to flush the port. Ports are usually flushed with saline solution or a medicine called heparin. The need for flushing will depend on how the port is used.  If the port is used for intermittent medicines or blood draws, the port will need to be flushed: ? After medicines have been given. ? After blood has been drawn. ? As part of routine maintenance.  If a constant infusion is running, the port may not need to be flushed.  How long will my port stay implanted? The port can stay in for as long as your health care provider thinks it is needed. When it is time for the port to come out, surgery will be   done to remove it. The procedure is similar to the one performed when the port was put in. When should I seek immediate medical care? When you have an implanted port, you should seek immediate medical care if:  You notice a bad smell coming from the incision site.  You have swelling, redness, or drainage at the incision site.  You have more swelling or pain at the port site or the surrounding area.  You have a fever that is not controlled with medicine.  This information is not intended to replace advice given to you by your health care provider. Make sure you discuss any questions you have with your health care provider. Document  Released: 03/19/2005 Document Revised: 08/25/2015 Document Reviewed: 11/24/2012 [employer] Interactive Patient Education  2017 [employer]  

## 2017-08-01 NOTE — Patient Instructions (Signed)
[facility] Discharge Instructions for Patients Receiving Chemotherapy  Today you received the following chemotherapy agents:  Keytruda.  To help prevent nausea and vomiting after your treatment, we encourage you to take your nausea medication as directed.   If you develop nausea and vomiting that is not controlled by your nausea medication, call the clinic.   BELOW ARE SYMPTOMS THAT SHOULD BE REPORTED IMMEDIATELY:  *FEVER GREATER THAN 100.5 F  *CHILLS WITH OR WITHOUT FEVER  NAUSEA AND VOMITING THAT IS NOT CONTROLLED WITH YOUR NAUSEA MEDICATION  *UNUSUAL SHORTNESS OF BREATH  *UNUSUAL BRUISING OR BLEEDING  TENDERNESS IN MOUTH AND THROAT WITH OR WITHOUT PRESENCE OF ULCERS  *URINARY PROBLEMS  *BOWEL PROBLEMS  UNUSUAL RASH Items with * indicate a potential emergency and should be followed up as soon as possible.  Feel free to call the clinic should you have any questions or concerns. The clinic phone number is [phone].  Please show the CHEMO ALERT CARD at check-in to the Emergency Department and triage nurse.    

## 2017-08-01 NOTE — Telephone Encounter (Signed)
Scheduled appt per 5/2 los - pt to get an updated schedule in the treatment area.

## 2017-08-22 ENCOUNTER — Inpatient Hospital Stay (HOSPITAL_BASED_OUTPATIENT_CLINIC_OR_DEPARTMENT_OTHER): Payer: Medicare HMO | Admitting: Internal Medicine

## 2017-08-22 ENCOUNTER — Encounter: Payer: Self-pay | Admitting: Internal Medicine

## 2017-08-22 ENCOUNTER — Telehealth: Payer: Self-pay | Admitting: Internal Medicine

## 2017-08-22 ENCOUNTER — Inpatient Hospital Stay: Payer: Medicare HMO

## 2017-08-22 VITALS — BP 121/66 | HR 84 | Temp 97.9°F | Resp 18 | Ht 73.0 in | Wt 238.2 lb

## 2017-08-22 DIAGNOSIS — C3432 Malignant neoplasm of lower lobe, left bronchus or lung: Secondary | ICD-10-CM | POA: Diagnosis not present

## 2017-08-22 DIAGNOSIS — C3492 Malignant neoplasm of unspecified part of left bronchus or lung: Secondary | ICD-10-CM

## 2017-08-22 DIAGNOSIS — C770 Secondary and unspecified malignant neoplasm of lymph nodes of head, face and neck: Secondary | ICD-10-CM | POA: Diagnosis not present

## 2017-08-22 DIAGNOSIS — Z9221 Personal history of antineoplastic chemotherapy: Secondary | ICD-10-CM

## 2017-08-22 DIAGNOSIS — C7951 Secondary malignant neoplasm of bone: Secondary | ICD-10-CM | POA: Diagnosis not present

## 2017-08-22 DIAGNOSIS — Z923 Personal history of irradiation: Secondary | ICD-10-CM

## 2017-08-22 DIAGNOSIS — Z5112 Encounter for antineoplastic immunotherapy: Secondary | ICD-10-CM | POA: Diagnosis not present

## 2017-08-22 DIAGNOSIS — Z79899 Other long term (current) drug therapy: Secondary | ICD-10-CM

## 2017-08-22 DIAGNOSIS — E039 Hypothyroidism, unspecified: Secondary | ICD-10-CM

## 2017-08-22 DIAGNOSIS — Z902 Acquired absence of lung [part of]: Secondary | ICD-10-CM

## 2017-08-22 DIAGNOSIS — R5382 Chronic fatigue, unspecified: Secondary | ICD-10-CM

## 2017-08-22 DIAGNOSIS — Z95828 Presence of other vascular implants and grafts: Secondary | ICD-10-CM

## 2017-08-22 LAB — COMPREHENSIVE METABOLIC PANEL
ALK PHOS: 86 U/L (ref 40–150)
ALT: 18 U/L (ref 0–55)
AST: 20 U/L (ref 5–34)
Albumin: 3.7 g/dL (ref 3.5–5.0)
Anion gap: 8 (ref 3–11)
BILIRUBIN TOTAL: 0.6 mg/dL (ref 0.2–1.2)
BUN: 20 mg/dL (ref 7–26)
CO2: 23 mmol/L (ref 22–29)
CREATININE: 0.95 mg/dL (ref 0.70–1.30)
Calcium: 9 mg/dL (ref 8.4–10.4)
Chloride: 108 mmol/L (ref 98–109)
Glucose, Bld: 179 mg/dL — ABNORMAL HIGH (ref 70–140)
Potassium: 3.8 mmol/L (ref 3.5–5.1)
Sodium: 139 mmol/L (ref 136–145)
TOTAL PROTEIN: 6.2 g/dL — AB (ref 6.4–8.3)

## 2017-08-22 LAB — CBC WITH DIFFERENTIAL/PLATELET
BASOS PCT: 0 %
Basophils Absolute: 0 10*3/uL (ref 0.0–0.1)
Eosinophils Absolute: 0.1 10*3/uL (ref 0.0–0.5)
Eosinophils Relative: 3 %
HCT: 38.7 % (ref 38.4–49.9)
HEMOGLOBIN: 13.4 g/dL (ref 13.0–17.1)
LYMPHS PCT: 23 %
Lymphs Abs: 1.2 10*3/uL (ref 0.9–3.3)
MCH: 33.7 pg — AB (ref 27.2–33.4)
MCHC: 34.6 g/dL (ref 32.0–36.0)
MCV: 97.2 fL (ref 79.3–98.0)
MONOS PCT: 7 %
Monocytes Absolute: 0.4 10*3/uL (ref 0.1–0.9)
NEUTROS ABS: 3.4 10*3/uL (ref 1.5–6.5)
Neutrophils Relative %: 67 %
PLATELETS: 152 10*3/uL (ref 140–400)
RBC: 3.98 MIL/uL — ABNORMAL LOW (ref 4.20–5.82)
RDW: 13 % (ref 11.0–14.6)
WBC: 5 10*3/uL (ref 4.0–10.3)

## 2017-08-22 LAB — TSH: TSH: <0.08 u[IU]/mL — ABNORMAL LOW (ref 0.320–4.118)

## 2017-08-22 MED ORDER — SODIUM CHLORIDE 0.9% FLUSH
10.0000 mL | INTRAVENOUS | Status: DC | PRN
  Administered 2017-08-22: 10 mL
  Filled 2017-08-22: qty 10

## 2017-08-22 MED ORDER — SODIUM CHLORIDE 0.9 % IV SOLN
200.0000 mg | Freq: Once | INTRAVENOUS | Status: AC
  Administered 2017-08-22: 200 mg via INTRAVENOUS
  Filled 2017-08-22: qty 8

## 2017-08-22 MED ORDER — HEPARIN SOD (PORK) LOCK FLUSH 100 UNIT/ML IV SOLN
500.0000 [IU] | Freq: Once | INTRAVENOUS | Status: AC | PRN
  Administered 2017-08-22: 500 [IU]
  Filled 2017-08-22: qty 5

## 2017-08-22 MED ORDER — SODIUM CHLORIDE 0.9 % IV SOLN
Freq: Once | INTRAVENOUS | Status: AC
  Administered 2017-08-22: 10:00:00 via INTRAVENOUS

## 2017-08-22 NOTE — Patient Instructions (Signed)
[facility] Discharge Instructions for Patients Receiving Chemotherapy  Today you received the following chemotherapy agents Beryle Flock   To help prevent nausea and vomiting after your treatment, we encourage you to take your nausea medication as directed  If you develop nausea and vomiting that is not controlled by your nausea medication, call the clinic.   BELOW ARE SYMPTOMS THAT SHOULD BE REPORTED IMMEDIATELY:  *FEVER GREATER THAN 100.5 F  *CHILLS WITH OR WITHOUT FEVER  NAUSEA AND VOMITING THAT IS NOT CONTROLLED WITH YOUR NAUSEA MEDICATION  *UNUSUAL SHORTNESS OF BREATH  *UNUSUAL BRUISING OR BLEEDING  TENDERNESS IN MOUTH AND THROAT WITH OR WITHOUT PRESENCE OF ULCERS  *URINARY PROBLEMS  *BOWEL PROBLEMS  UNUSUAL RASH Items with * indicate a potential emergency and should be followed up as soon as possible.  Feel free to call the clinic you have any questions or concerns. The clinic phone number is [phone].

## 2017-08-22 NOTE — Telephone Encounter (Signed)
Scheduled appt per 5/23 los - pt to get an updated schedule in the treatment area.
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
## 2017-09-12 ENCOUNTER — Inpatient Hospital Stay: Payer: Medicare HMO | Attending: Internal Medicine

## 2017-09-12 ENCOUNTER — Inpatient Hospital Stay: Payer: Medicare HMO

## 2017-09-12 ENCOUNTER — Inpatient Hospital Stay (HOSPITAL_BASED_OUTPATIENT_CLINIC_OR_DEPARTMENT_OTHER): Payer: Medicare HMO | Admitting: Internal Medicine

## 2017-09-12 ENCOUNTER — Encounter: Payer: Self-pay | Admitting: Internal Medicine

## 2017-09-12 ENCOUNTER — Telehealth: Payer: Self-pay | Admitting: Internal Medicine

## 2017-09-12 VITALS — BP 116/81 | HR 97 | Temp 97.7°F | Resp 18 | Ht 73.0 in | Wt 237.8 lb

## 2017-09-12 DIAGNOSIS — Z902 Acquired absence of lung [part of]: Secondary | ICD-10-CM | POA: Insufficient documentation

## 2017-09-12 DIAGNOSIS — C3490 Malignant neoplasm of unspecified part of unspecified bronchus or lung: Secondary | ICD-10-CM

## 2017-09-12 DIAGNOSIS — C3492 Malignant neoplasm of unspecified part of left bronchus or lung: Secondary | ICD-10-CM

## 2017-09-12 DIAGNOSIS — C3432 Malignant neoplasm of lower lobe, left bronchus or lung: Secondary | ICD-10-CM

## 2017-09-12 DIAGNOSIS — Z923 Personal history of irradiation: Secondary | ICD-10-CM

## 2017-09-12 DIAGNOSIS — Z5112 Encounter for antineoplastic immunotherapy: Secondary | ICD-10-CM | POA: Insufficient documentation

## 2017-09-12 DIAGNOSIS — I481 Persistent atrial fibrillation: Secondary | ICD-10-CM | POA: Insufficient documentation

## 2017-09-12 DIAGNOSIS — C770 Secondary and unspecified malignant neoplasm of lymph nodes of head, face and neck: Secondary | ICD-10-CM | POA: Diagnosis not present

## 2017-09-12 DIAGNOSIS — C7951 Secondary malignant neoplasm of bone: Secondary | ICD-10-CM | POA: Diagnosis not present

## 2017-09-12 DIAGNOSIS — Z79899 Other long term (current) drug therapy: Secondary | ICD-10-CM | POA: Insufficient documentation

## 2017-09-12 DIAGNOSIS — E785 Hyperlipidemia, unspecified: Secondary | ICD-10-CM | POA: Diagnosis not present

## 2017-09-12 DIAGNOSIS — Z9221 Personal history of antineoplastic chemotherapy: Secondary | ICD-10-CM

## 2017-09-12 DIAGNOSIS — J449 Chronic obstructive pulmonary disease, unspecified: Secondary | ICD-10-CM | POA: Diagnosis not present

## 2017-09-12 DIAGNOSIS — E039 Hypothyroidism, unspecified: Secondary | ICD-10-CM

## 2017-09-12 DIAGNOSIS — R5382 Chronic fatigue, unspecified: Secondary | ICD-10-CM

## 2017-09-12 DIAGNOSIS — Z95828 Presence of other vascular implants and grafts: Secondary | ICD-10-CM

## 2017-09-12 LAB — COMPREHENSIVE METABOLIC PANEL
ALK PHOS: 91 U/L (ref 40–150)
ALT: 14 U/L (ref 0–55)
ANION GAP: 8 (ref 3–11)
AST: 19 U/L (ref 5–34)
Albumin: 3.9 g/dL (ref 3.5–5.0)
BILIRUBIN TOTAL: 0.8 mg/dL (ref 0.2–1.2)
BUN: 14 mg/dL (ref 7–26)
CALCIUM: 9.4 mg/dL (ref 8.4–10.4)
CO2: 26 mmol/L (ref 22–29)
CREATININE: 0.84 mg/dL (ref 0.70–1.30)
Chloride: 105 mmol/L (ref 98–109)
GFR calc non Af Amer: >60 mL/min (ref >60)
Glucose, Bld: 108 mg/dL (ref 70–140)
Potassium: 4 mmol/L (ref 3.5–5.1)
Sodium: 139 mmol/L (ref 136–145)
TOTAL PROTEIN: 6.3 g/dL — AB (ref 6.4–8.3)

## 2017-09-12 LAB — CBC WITH DIFFERENTIAL/PLATELET
BASOS PCT: 0 %
Basophils Absolute: 0 10*3/uL (ref 0.0–0.1)
EOS ABS: 0.1 10*3/uL (ref 0.0–0.5)
EOS PCT: 2 %
HCT: 40 % (ref 38.4–49.9)
HEMOGLOBIN: 14 g/dL (ref 13.0–17.1)
Lymphocytes Relative: 24 %
Lymphs Abs: 1.3 10*3/uL (ref 0.9–3.3)
MCH: 33.3 pg (ref 27.2–33.4)
MCHC: 35 g/dL (ref 32.0–36.0)
MCV: 95 fL (ref 79.3–98.0)
MONOS PCT: 11 %
Monocytes Absolute: 0.6 10*3/uL (ref 0.1–0.9)
NEUTROS PCT: 63 %
Neutro Abs: 3.3 10*3/uL (ref 1.5–6.5)
PLATELETS: 158 10*3/uL (ref 140–400)
RBC: 4.21 MIL/uL (ref 4.20–5.82)
RDW: 12.8 % (ref 11.0–14.6)
WBC: 5.3 10*3/uL (ref 4.0–10.3)

## 2017-09-12 LAB — TSH: TSH: <0.08 u[IU]/mL — ABNORMAL LOW (ref 0.320–4.118)

## 2017-09-12 MED ORDER — SODIUM CHLORIDE 0.9% FLUSH
10.0000 mL | INTRAVENOUS | Status: DC | PRN
  Administered 2017-09-12: 10 mL
  Filled 2017-09-12: qty 10

## 2017-09-12 MED ORDER — SODIUM CHLORIDE 0.9 % IV SOLN
200.0000 mg | Freq: Once | INTRAVENOUS | Status: AC
  Administered 2017-09-12: 200 mg via INTRAVENOUS
  Filled 2017-09-12: qty 8

## 2017-09-12 MED ORDER — SODIUM CHLORIDE 0.9 % IV SOLN
Freq: Once | INTRAVENOUS | Status: AC
  Administered 2017-09-12: 10:00:00 via INTRAVENOUS

## 2017-09-12 MED ORDER — HEPARIN SOD (PORK) LOCK FLUSH 100 UNIT/ML IV SOLN
500.0000 [IU] | Freq: Once | INTRAVENOUS | Status: AC | PRN
  Administered 2017-09-12: 500 [IU]
  Filled 2017-09-12: qty 5

## 2017-09-12 NOTE — Telephone Encounter (Signed)
Scheduled appt per 6/13 los - gave patient AVS and calender per los. - Central radiology to contact patient with ct scan .

## 2017-09-12 NOTE — Progress Notes (Signed)
[facility] [phone]   Fax:([phone]  OFFICE PROGRESS NOTE  Victor Dy, MD [address] [place] [address]  DIAGNOSIS: Metastatic non-small cell lung cancer initially diagnosed as stage IIIA (T2a, N2, M0) non-small cell lung cancer, poorly differentiated adenocarcinoma presented with left lower lobe lung mass in addition to mediastinal lymphadenopathy.  The patient was diagnosed with metastatic disease involving the left femur as well as left supraclavicular nodal metastases and right paratracheal lymphadenopathy in October 2018.  Biomarker Findings Microsatellite Status - MS-Stable Tumor Mutational Burden - TMB-Low (3 Muts/Mb) Genomic Findings For a complete list of the genes assayed, please refer to the Appendix. STK11 P262f*6 [id number] site 1983+3A>SDAXX E374* MLL2 [id number] NBN K23346f NOTCH2 [id number] splice site [id number]+4L>PDisease relevant genes with no reportable alterations: EGFR, KRAS, ALK, BRAF, MET, RET, ERBB2, ROS1   PDL1 expression 5%  PRIOR THERAPY: 1) status post wedge resection of the left lower lobe lung mass as well as AP window lymph node dissection but there was residual metastatic mediastinal lymphadenopathy that could not be resected. 2) a course of concurrent chemorad[place] carbopl[place]  3) status post palliative radiotherapy to the left femur metastatic bone disease. 4)  Systemic chemotherapy with carboplatin for AUC of 5, Alimta 500 mg/M2 and Keytruda 200 mg IV every 3 weeks.  First dose March 07, 2017.  Carboplatin was discontinued during cycle #2 secondary to hypersensitivity reaction. 5) status post 2 cycles of maintenance treatment with Alimta and Ketruda (pembrolizumab).  Alimta was discontinued secondary to intolerance.  CURRENT THERAPY: Maintenance treatment with single agent Ketruda (pembrolizumab) status post 4  cycles.  INTERVAL HISTORY: Victor Huber 67o. male returns to the clinic today for follow-up visit accompanied by his son.  The patient is feeling fine today with no concerning complaints.  He continues to tolerate his treatment with Keytruda fairly well.  He denied having any chest pain, shortness breath, cough or hemoptysis.  He denied having any fever or chills.  He has no nausea, vomiting, diarrhea or constipation.  He has mild pain in the left femur area.  He is here today for evaluation before starting cycle #5 of his treatment with Keytruda.   MEDICAL HISTORY: Past Medical History:  Diagnosis Date  . Adenocarcinoma of left lung, stage 3 ([facility]/16/[facility]  . Arthritis   . Atrial [place] ([facility]/16/2018  . Atrial fibrillation ([facility] COPD (chronic obstructive pulmonary disease) ([facility]/16/2018  . History of chemotherapy   . History of radiation therapy   . Hyperlipidemia   . Hypothyroid 08/15/2016  . Longstanding persistent atrial fibrillation ([facility]/30/2018  . Pathologic fracture    left femur  . Pneumonitis   . S/P TURP 08/15/2016  . Wears glasses   . Wears hearing aid in both ears     ALLERGIES:  is allergic to carboplatin and flecainide.  MEDICATIONS:  Current Outpatient Medications  Medication Sig Dispense Refill  . apixaban (ELIQUIS) 5 MG TABS tablet Take 5 mg by mouth 2 (two) times daily.    . aMarland Kitchenenolol (TENORMIN) 50 MG tablet TAKE 1 TABLET DAILY (DISCONTINUE METOPROLOL TART '50MG'$ ) 90 tablet 3  . atorvastatin (LIPITOR) 40 MG tablet Take 40 mg by mouth daily.    . baclofen (LIORESAL) 10 MG tablet Take 1 tablet (10 mg total) by mouth 3 (three) times daily. As needed for muscle spasm (Patient not taking: Reported  on 08/01/2017) 50 tablet 0  . betamethasone dipropionate (DIPROLENE) 0.05 % cream Apply topically 2 (two) times daily. Do not use on face 45 g 1  . citalopram (CELEXA) 40 MG tablet Take 40 mg by mouth daily.    Marland Kitchen dexamethasone (DECADRON) 4 MG tablet 4 mg  p.o. twice daily the day before, day of and day after chemotherapy every 3 weeks (Patient not taking: Reported on 08/01/2017) 40 tablet 1  . folic acid (FOLVITE) 1 MG tablet Take 1 tablet (1 mg total) by mouth daily. 30 tablet 4  . ibuprofen (ADVIL,MOTRIN) 400 MG tablet Take 400 mg by mouth every 4 (four) hours as needed for moderate pain.    Marland Kitchen levothyroxine (SYNTHROID, LEVOTHROID) 200 MCG tablet Take 1 tablet (200 mcg total) by mouth daily before breakfast. 90 tablet 1  . levothyroxine (SYNTHROID, LEVOTHROID) 25 MCG tablet Take 1.5 tablets (37.5 mcg total) by mouth daily. 135 tablet 1  . LORazepam (ATIVAN) 0.5 MG tablet Take 1 tablet (0.5 mg total) by mouth every 8 (eight) hours as needed (nausea). (Patient not taking: Reported on 08/01/2017) 30 tablet 0  . ondansetron (ZOFRAN) 4 MG tablet Take 1 tablet (4 mg total) by mouth every 8 (eight) hours as needed for nausea or vomiting. (Patient not taking: Reported on 08/01/2017) 30 tablet 0  . oxyCODONE (ROXICODONE) 5 MG immediate release tablet Take 1-2 tablets (5-10 mg total) by mouth every 4 (four) hours as needed for severe pain. (Patient not taking: Reported on 08/01/2017) 50 tablet 0  . prochlorperazine (COMPAZINE) 10 MG tablet Take 1 tablet (10 mg total) by mouth every 6 (six) hours as needed for nausea or vomiting. (Patient not taking: Reported on 08/01/2017) 30 tablet 0  . sennosides-docusate sodium (SENOKOT-S) 8.6-50 MG tablet Take 2 tablets by mouth daily. 30 tablet 1  . tamsulosin (FLOMAX) 0.4 MG CAPS capsule Take 0.4 mg daily by mouth.     . zolpidem (AMBIEN) 5 MG tablet Take 5 mg by mouth at bedtime as needed for sleep.     No current facility-administered medications for this visit.    Facility-Administered Medications Ordered in Other Visits  Medication Dose Route Frequency Provider Last Rate Last Dose  . sodium chloride flush (NS) 0.9 % injection 10 mL  10 mL Intracatheter PRN Curt Bears, MD   10 mL at 09/12/17 0856    SURGICAL HISTORY:    Past Surgical History:  Procedure Laterality Date  . BRONCHOSCOPY  10/2014  . CARDIAC CATHETERIZATION     05/07/12  . CARDIOVERSION     x2  . COLONOSCOPY    . DG BIOPSY LUNG Left 10/2014   FNA - Adenocarcinoma   . FEMUR IM NAIL Left 02/19/2017  . FEMUR IM NAIL Left 02/19/2017   Procedure: INTRAMEDULLARY (IM) NAIL FEMORAL;  Surgeon: Marchia Bond, MD;  Location: [facility];  Service: Orthopedics;  Laterality: Left;  . IR FLUORO GUIDE PORT INSERTION RIGHT  07/03/2017  . IR US GUIDE VASC ACCESS RIGHT  07/03/2017  . LUNG CANCER SURGERY Left 12/2014   Wedge Resection   . MULTIPLE TOOTH EXTRACTIONS    . Status post TURP    . TONSILLECTOMY      REVIEW OF SYSTEMS:  A comprehensive review of systems was negative except for: Musculoskeletal: positive for bone pain   PHYSICAL EXAMINATION: General appearance: alert, cooperative and no distress Head: Normocephalic, without obvious abnormality, atraumatic Neck: no adenopathy, no JVD, supple, symmetrical, trachea midline and thyroid not enlarged, symmetric, no tenderness/mass/nodules Lymph  nodes: Cervical, supraclavicular, and axillary nodes normal. Resp: clear to auscultation bilaterally Back: symmetric, no curvature. ROM normal. No CVA tenderness. Cardio: regular rate and rhythm, S1, S2 normal, no murmur, click, rub or gallop GI: soft, non-tender; bowel sounds normal; no masses,  no organomegaly Extremities: extremities normal, atraumatic, no cyanosis or edema  ECOG PERFORMANCE STATUS: 1 - Symptomatic but completely ambulatory  Blood pressure 116/81, pulse 97, temperature 97.7 F (36.5 C), temperature source Oral, resp. rate 18, height '6\' 1"'$  (1.854 m), weight 237 lb 12.8 oz (107.9 kg), SpO2 97 %.  LABORATORY DATA: Lab Results  Component Value Date   WBC 5.0 08/22/2017   HGB 13.4 08/22/2017   HCT 38.7 08/22/2017   MCV 97.2 08/22/2017   PLT 152 08/22/2017      Chemistry      Component Value Date/Time   NA 139 08/22/2017 0850   NA 136  04/04/2017 1141   K 3.8 08/22/2017 0850   K 4.4 04/04/2017 1141   CL 108 08/22/2017 0850   CO2 23 08/22/2017 0850   CO2 24 04/04/2017 1141   BUN 20 08/22/2017 0850   BUN 21.1 04/04/2017 1141   CREATININE 0.95 08/22/2017 0850   CREATININE 1.10 05/28/2017 1312   CREATININE 1.1 04/04/2017 1141      Component Value Date/Time   CALCIUM 9.0 08/22/2017 0850   CALCIUM 9.1 04/04/2017 1141   ALKPHOS 86 08/22/2017 0850   ALKPHOS 89 04/04/2017 1141   AST 20 08/22/2017 0850   AST 68 (H) 05/28/2017 1312   AST 17 04/04/2017 1141   ALT 18 08/22/2017 0850   ALT 59 (H) 05/28/2017 1312   ALT 18 04/04/2017 1141   BILITOT 0.6 08/22/2017 0850   BILITOT 0.6 05/28/2017 1312   BILITOT 0.67 04/04/2017 1141       RADIOGRAPHIC STUDIES: No results found.  ASSESSMENT AND PLAN: This is a 67 years old white male with metastatic non-small cell lung cancer, adenocarcinoma with no actionable mutations and PDL 1 expression of 5% that was initially diagnosed as stage IIIa non-small cell lung cancer, adenocarcinoma status post left lower lobectomy with lymph node dissection followed by a course of concurrent chemoradiation completed in January 2016.  The patient had evidence for disease metastasis in October 2018 with metastatic disease to the left femur as well as left supraclavicular and right paratracheal lymph nodes. The patient is currently on systemic chemotherapy initially was with carboplatin, Alimta and Keytruda.  Carboplatin was discontinued secondary to hypersensitivity reaction starting from cycle #2.   He was also treated with 3 cycles of maintenance Alimta and Ketruda (pembrolizumab) but Alimta was discontinued secondary to intolerance. He is currently undergoing treatment with maintenance Keytruda as a single agent status post 4 cycles.  The patient continues to tolerate this treatment well with no concerning complaints.  We will proceed with cycle #5 today as scheduled. I will see him back for  follow-up visit in 3 weeks for evaluation after repeating CT scan of the chest, abdomen and pelvis as well as the left femur. The patient was advised to call immediately if he has any concerning symptoms in the interval. The patient voices understanding of current disease status and treatment options and is in agreement with the current care plan. All questions were answered. The patient knows to call the clinic with any problems, questions or concerns. We can certainly see the patient much sooner if necessary.  Disclaimer: This note was dictated with voice recognition software. Similar sounding words can inadvertently  be transcribed and may not be corrected upon review.

## 2017-09-12 NOTE — Patient Instructions (Signed)
[facility] Discharge Instructions for Patients Receiving Chemotherapy  Today you received the following chemotherapy agents:  Keytruda.  To help prevent nausea and vomiting after your treatment, we encourage you to take your nausea medication as directed.   If you develop nausea and vomiting that is not controlled by your nausea medication, call the clinic.   BELOW ARE SYMPTOMS THAT SHOULD BE REPORTED IMMEDIATELY:  *FEVER GREATER THAN 100.5 F  *CHILLS WITH OR WITHOUT FEVER  NAUSEA AND VOMITING THAT IS NOT CONTROLLED WITH YOUR NAUSEA MEDICATION  *UNUSUAL SHORTNESS OF BREATH  *UNUSUAL BRUISING OR BLEEDING  TENDERNESS IN MOUTH AND THROAT WITH OR WITHOUT PRESENCE OF ULCERS  *URINARY PROBLEMS  *BOWEL PROBLEMS  UNUSUAL RASH Items with * indicate a potential emergency and should be followed up as soon as possible.  Feel free to call the clinic should you have any questions or concerns. The clinic phone number is [phone].  Please show the CHEMO ALERT CARD at check-in to the Emergency Department and triage nurse.    

## 2017-10-01 ENCOUNTER — Other Ambulatory Visit: Payer: Self-pay | Admitting: *Deleted

## 2017-10-01 ENCOUNTER — Ambulatory Visit (HOSPITAL_COMMUNITY)
Admission: RE | Admit: 2017-10-01 | Discharge: 2017-10-01 | Disposition: A | Discharge status: Home / Self Care | Payer: Medicare HMO | Source: Ambulatory Visit | Attending: Internal Medicine | Admitting: Internal Medicine

## 2017-10-01 ENCOUNTER — Encounter (HOSPITAL_COMMUNITY): Payer: Self-pay

## 2017-10-01 DIAGNOSIS — J432 Centrilobular emphysema: Secondary | ICD-10-CM | POA: Insufficient documentation

## 2017-10-01 DIAGNOSIS — C3490 Malignant neoplasm of unspecified part of unspecified bronchus or lung: Secondary | ICD-10-CM

## 2017-10-01 DIAGNOSIS — M899 Disorder of bone, unspecified: Secondary | ICD-10-CM | POA: Insufficient documentation

## 2017-10-01 DIAGNOSIS — K8020 Calculus of gallbladder without cholecystitis without obstruction: Secondary | ICD-10-CM | POA: Diagnosis not present

## 2017-10-01 DIAGNOSIS — R590 Localized enlarged lymph nodes: Secondary | ICD-10-CM | POA: Insufficient documentation

## 2017-10-01 DIAGNOSIS — K5730 Diverticulosis of large intestine without perforation or abscess without bleeding: Secondary | ICD-10-CM | POA: Insufficient documentation

## 2017-10-01 DIAGNOSIS — J438 Other emphysema: Secondary | ICD-10-CM | POA: Diagnosis not present

## 2017-10-01 DIAGNOSIS — C3492 Malignant neoplasm of unspecified part of left bronchus or lung: Secondary | ICD-10-CM

## 2017-10-01 MED ORDER — IOPAMIDOL (ISOVUE-300) INJECTION 61%
INTRAVENOUS | Status: AC
  Filled 2017-10-01: qty 100

## 2017-10-01 MED ORDER — HEPARIN SOD (PORK) LOCK FLUSH 100 UNIT/ML IV SOLN
500.0000 [IU] | Freq: Once | INTRAVENOUS | Status: AC
  Administered 2017-10-01: 500 [IU] via INTRAVENOUS

## 2017-10-01 MED ORDER — HEPARIN SOD (PORK) LOCK FLUSH 100 UNIT/ML IV SOLN
INTRAVENOUS | Status: AC
  Filled 2017-10-01: qty 5

## 2017-10-01 MED ORDER — IOPAMIDOL (ISOVUE-300) INJECTION 61%
100.0000 mL | Freq: Once | INTRAVENOUS | Status: AC | PRN
  Administered 2017-10-01: 100 mL via INTRAVENOUS

## 2017-10-02 ENCOUNTER — Telehealth: Payer: Self-pay | Admitting: Internal Medicine

## 2017-10-02 ENCOUNTER — Inpatient Hospital Stay: Payer: Medicare HMO

## 2017-10-02 ENCOUNTER — Inpatient Hospital Stay: Payer: Medicare HMO | Attending: Internal Medicine

## 2017-10-02 ENCOUNTER — Inpatient Hospital Stay (HOSPITAL_BASED_OUTPATIENT_CLINIC_OR_DEPARTMENT_OTHER): Payer: Medicare HMO | Admitting: Oncology

## 2017-10-02 ENCOUNTER — Encounter: Payer: Self-pay | Admitting: Oncology

## 2017-10-02 VITALS — BP 115/81 | HR 89 | Temp 98.2°F | Resp 18 | Ht 73.5 in | Wt 232.8 lb

## 2017-10-02 DIAGNOSIS — N281 Cyst of kidney, acquired: Secondary | ICD-10-CM | POA: Diagnosis not present

## 2017-10-02 DIAGNOSIS — Z902 Acquired absence of lung [part of]: Secondary | ICD-10-CM | POA: Insufficient documentation

## 2017-10-02 DIAGNOSIS — C3492 Malignant neoplasm of unspecified part of left bronchus or lung: Secondary | ICD-10-CM

## 2017-10-02 DIAGNOSIS — C3432 Malignant neoplasm of lower lobe, left bronchus or lung: Secondary | ICD-10-CM

## 2017-10-02 DIAGNOSIS — K4090 Unilateral inguinal hernia, without obstruction or gangrene, not specified as recurrent: Secondary | ICD-10-CM | POA: Diagnosis not present

## 2017-10-02 DIAGNOSIS — Z923 Personal history of irradiation: Secondary | ICD-10-CM | POA: Insufficient documentation

## 2017-10-02 DIAGNOSIS — K5730 Diverticulosis of large intestine without perforation or abscess without bleeding: Secondary | ICD-10-CM | POA: Insufficient documentation

## 2017-10-02 DIAGNOSIS — I481 Persistent atrial fibrillation: Secondary | ICD-10-CM | POA: Diagnosis not present

## 2017-10-02 DIAGNOSIS — I2510 Atherosclerotic heart disease of native coronary artery without angina pectoris: Secondary | ICD-10-CM | POA: Insufficient documentation

## 2017-10-02 DIAGNOSIS — E039 Hypothyroidism, unspecified: Secondary | ICD-10-CM | POA: Insufficient documentation

## 2017-10-02 DIAGNOSIS — C7951 Secondary malignant neoplasm of bone: Secondary | ICD-10-CM

## 2017-10-02 DIAGNOSIS — C770 Secondary and unspecified malignant neoplasm of lymph nodes of head, face and neck: Secondary | ICD-10-CM | POA: Insufficient documentation

## 2017-10-02 DIAGNOSIS — E785 Hyperlipidemia, unspecified: Secondary | ICD-10-CM | POA: Insufficient documentation

## 2017-10-02 DIAGNOSIS — Z5112 Encounter for antineoplastic immunotherapy: Secondary | ICD-10-CM | POA: Diagnosis not present

## 2017-10-02 DIAGNOSIS — K8020 Calculus of gallbladder without cholecystitis without obstruction: Secondary | ICD-10-CM | POA: Diagnosis not present

## 2017-10-02 DIAGNOSIS — Z9221 Personal history of antineoplastic chemotherapy: Secondary | ICD-10-CM | POA: Insufficient documentation

## 2017-10-02 DIAGNOSIS — Z79899 Other long term (current) drug therapy: Secondary | ICD-10-CM

## 2017-10-02 DIAGNOSIS — I700 Atherosclerosis of aorta: Secondary | ICD-10-CM | POA: Diagnosis not present

## 2017-10-02 DIAGNOSIS — J449 Chronic obstructive pulmonary disease, unspecified: Secondary | ICD-10-CM | POA: Insufficient documentation

## 2017-10-02 DIAGNOSIS — Z95828 Presence of other vascular implants and grafts: Secondary | ICD-10-CM

## 2017-10-02 DIAGNOSIS — R5382 Chronic fatigue, unspecified: Secondary | ICD-10-CM

## 2017-10-02 LAB — CBC WITH DIFFERENTIAL (CANCER CENTER ONLY)
Basophils Absolute: 0 10*3/uL (ref 0.0–0.1)
Basophils Relative: 1 %
EOS ABS: 0.1 10*3/uL (ref 0.0–0.5)
Eosinophils Relative: 2 %
HEMATOCRIT: 41.5 % (ref 38.4–49.9)
Hemoglobin: 14.3 g/dL (ref 13.0–17.1)
LYMPHS ABS: 1.2 10*3/uL (ref 0.9–3.3)
Lymphocytes Relative: 20 %
MCH: 33 pg (ref 27.2–33.4)
MCHC: 34.5 g/dL (ref 32.0–36.0)
MCV: 95.8 fL (ref 79.3–98.0)
MONO ABS: 0.5 10*3/uL (ref 0.1–0.9)
MONOS PCT: 9 %
NEUTROS PCT: 68 %
Neutro Abs: 4 10*3/uL (ref 1.5–6.5)
Platelet Count: 164 10*3/uL (ref 140–400)
RBC: 4.33 MIL/uL (ref 4.20–5.82)
RDW: 13 % (ref 11.0–14.6)
WBC Count: 5.8 10*3/uL (ref 4.0–10.3)

## 2017-10-02 LAB — CMP (CANCER CENTER ONLY)
ALBUMIN: 4 g/dL (ref 3.5–5.0)
ALT: 19 U/L (ref 0–44)
AST: 19 U/L (ref 15–41)
Alkaline Phosphatase: 86 U/L (ref 38–126)
Anion gap: 7 (ref 5–15)
BUN: 15 mg/dL (ref 8–23)
CHLORIDE: 105 mmol/L (ref 98–111)
CO2: 27 mmol/L (ref 22–32)
CREATININE: 0.87 mg/dL (ref 0.61–1.24)
Calcium: 9.8 mg/dL (ref 8.9–10.3)
GFR, Estimated: >60 mL/min (ref >60)
GLUCOSE: 121 mg/dL — AB (ref 70–99)
Potassium: 4 mmol/L (ref 3.5–5.1)
SODIUM: 139 mmol/L (ref 135–145)
Total Bilirubin: 0.9 mg/dL (ref 0.3–1.2)
Total Protein: 6.2 g/dL — ABNORMAL LOW (ref 6.5–8.1)

## 2017-10-02 LAB — TSH

## 2017-10-02 MED ORDER — SODIUM CHLORIDE 0.9% FLUSH
10.0000 mL | INTRAVENOUS | Status: DC | PRN
  Administered 2017-10-02: 10 mL
  Filled 2017-10-02: qty 10

## 2017-10-02 MED ORDER — SODIUM CHLORIDE 0.9 % IV SOLN
200.0000 mg | Freq: Once | INTRAVENOUS | Status: AC
  Administered 2017-10-02: 200 mg via INTRAVENOUS
  Filled 2017-10-02: qty 8

## 2017-10-02 MED ORDER — SODIUM CHLORIDE 0.9 % IV SOLN
Freq: Once | INTRAVENOUS | Status: AC
  Administered 2017-10-02: 10:00:00 via INTRAVENOUS

## 2017-10-02 MED ORDER — SODIUM CHLORIDE 0.9 % IV SOLN: Freq: Once | INTRAVENOUS | Status: AC

## 2017-10-02 MED ORDER — HEPARIN SOD (PORK) LOCK FLUSH 100 UNIT/ML IV SOLN
500.0000 [IU] | Freq: Once | INTRAVENOUS | Status: AC | PRN
  Administered 2017-10-02: 500 [IU]
  Filled 2017-10-02: qty 5

## 2017-10-02 NOTE — Assessment & Plan Note (Addendum)
This is a 67 year old white male with metastatic non-small cell lung cancer, adenocarcinoma with no actionable mutations and PDL 1 expression of 5% that was initially diagnosed as stage IIIa non-small cell lung cancer, adenocarcinoma status post left lower lobectomy with lymph node dissection followed by a course of concurrent chemoradiation completed in January 2016.  The patient had evidence for disease metastasis in October 2018 with metastatic disease to the left femur as well as left supraclavicular and right paratracheal lymph nodes. The patient is currently on systemic chemotherapy initially was with carboplatin, Alimta and Keytruda.  Carboplatin was discontinued secondary to hypersensitivity reaction starting from cycle #2.   He was also treated with 3 cycles of maintenance Alimta and Ketruda (pembrolizumab) but Alimta was discontinued secondary to intolerance. He is currently undergoing treatment with maintenance Keytruda as a single agent status post 5 cycles.  The patient continues to tolerate this treatment well with no concerning complaints.   He had a restaging CT scan of the chest, abdomen, pelvis as well as a CT of the left femur and is here to discuss the results.  The patient was seen with Dr. Julien Nordmann.  CT scan results were discussed with patient and his wife which showed no evidence of disease progression.  Recommend for him to continue on treatment with single agent Keytruda.  He will proceed with cycle #6 today as scheduled. The patient will follow-up in 3 weeks for evaluation prior to cycle #7.  The patient was advised to call immediately if he has any concerning symptoms in the interval. The patient voices understanding of current disease status and treatment options and is in agreement with the current care plan. All questions were answered. The patient knows to call the clinic with any problems, questions or concerns. We can certainly see the patient much sooner if necessary.

## 2017-10-02 NOTE — Patient Instructions (Signed)
[facility] Discharge Instructions for Patients Receiving Chemotherapy  Today you received the following chemotherapy agents:  Keytruda.  To help prevent nausea and vomiting after your treatment, we encourage you to take your nausea medication as directed.   If you develop nausea and vomiting that is not controlled by your nausea medication, call the clinic.   BELOW ARE SYMPTOMS THAT SHOULD BE REPORTED IMMEDIATELY:  *FEVER GREATER THAN 100.5 F  *CHILLS WITH OR WITHOUT FEVER  NAUSEA AND VOMITING THAT IS NOT CONTROLLED WITH YOUR NAUSEA MEDICATION  *UNUSUAL SHORTNESS OF BREATH  *UNUSUAL BRUISING OR BLEEDING  TENDERNESS IN MOUTH AND THROAT WITH OR WITHOUT PRESENCE OF ULCERS  *URINARY PROBLEMS  *BOWEL PROBLEMS  UNUSUAL RASH Items with * indicate a potential emergency and should be followed up as soon as possible.  Feel free to call the clinic should you have any questions or concerns. The clinic phone number is [phone].  Please show the CHEMO ALERT CARD at check-in to the Emergency Department and triage nurse.    

## 2017-10-02 NOTE — Telephone Encounter (Signed)
Appts already scheduled per 7/3 los = no additional appts added/

## 2017-10-02 NOTE — Progress Notes (Signed)
[facility] OFFICE PROGRESS NOTE  [name], MD [address] [place] [address]  DIAGNOSIS: Metastatic non-small cell lung cancer initially diagnosed as stage IIIA (T2a, N2, M0) non-small cell lung cancer, poorly differentiated adenocarcinoma presented with left lower lobe lung mass in addition to mediastinal lymphadenopathy.  The patient was diagnosed with metastatic disease involving the left femur as well as left supraclavicular nodal metastases and right paratracheal lymphadenopathy in October 2018.  Biomarker Findings Microsatellite Status - MS-Stable Tumor Mutational Burden - TMB-Low (3 Muts/Mb) Genomic Findings For a complete list of the genes assayed, please refer to the Appendix. STK11 P246f*6 [id number] site 1308+6V>HDAXX E374* MLL2 [id number] NBN [id number] NOTCH2 [id number] splice site [id number]>XDisease relevant genes with no reportable alterations: EGFR, KRAS, ALK, BRAF, MET, RET, ERBB2, ROS1   PDL1 expression 5%  PRIOR THERAPY: 1) status post wedge resection of the left lower lobe lung mass as well as AP window lymph node dissection but there was residual metastatic mediastinal lymphadenopathy that could not be resected. 2) a course of concurrent chemorad[place] carbopl[place]  3) status post palliative radiotherapy to the left femur metastatic bone disease. 4)  Systemic chemotherapy with carboplatin for AUC of 5, Alimta 500 mg/M2 and Keytruda 200 mg IV every 3 weeks.  First dose March 07, 2017.  Carboplatin was discontinued during cycle #2 secondary to hypersensitivity reaction. 5) status post 2 cycles of maintenance treatment with Alimta and Ketruda (pembrolizumab).  Alimta was discontinued secondary to intolerance.  CURRENT THERAPY: Maintenance treatment with single agent Ketruda (pembrolizumab) status post 5 cycles.  INTERVAL HISTORY: MicJAMARRI VUNCANNON 67o.  male returns for routine follow-up visit accompanied by his wife.  The patient is feeling fine today has no specific complaints except for intermittent back pain which he attributes to his arthritis.  He uses ibuprofen on as-needed basis.  He denies fevers and chills.  Denies chest pain, shortness of breath, cough, hemoptysis.  Denies nausea, vomiting, constipation, diarrhea.  He has intermittent mild pain to the left femur.  The patient is here for evaluation prior to starting cycle [facility] his restaging CT scan results.  MEDICAL HISTORY: Past Medical History:  Diagnosis Date  . Adenocarcinoma of left lung, stage 3 ([place]/16/2018  . Arthritis   . Atrial fibrillation ([place]/16/2018  . Atrial fibrillation ([place] . COPD (chronic obstructive pulmonary disease) ([place]/16/2018  . History of chemotherapy   . History of radiation therapy   . Hyperlipidemia   . Hypothyroid 08/15/2016  . Longstanding persistent atrial fibrillation ([facility]/30/2018  . Pathologic fracture    left femur  . Pneumonitis   . S/P TURP 08/15/2016  . Wears glasses   . Wears hearing aid in both ears     ALLERGIES:  is allergic to carboplatin and flecainide.  MEDICATIONS:  Current Outpatient Medications  Medication Sig Dispense Refill  . apixaban (ELIQUIS) 5 MG TABS tablet Take 5 mg by mouth 2 (two) times daily.    . aMarland Kitchenenolol (TENORMIN) 50 MG tablet TAKE 1 TABLET DAILY (DISCONTINUE METOPROLOL TART 50MG) 90 tablet 3  . atorvastatin (LIPITOR) 40 MG tablet Take 40 mg by mouth daily.    . baclofen (LIORESAL) 10 MG tablet Take 1 tablet (10 mg total) by mouth 3 (three) times daily. As needed for muscle spasm 50 tablet 0  . citalopram (CELEXA) 40 MG tablet Take  40 mg by mouth daily.    Marland Kitchen ibuprofen (ADVIL,MOTRIN) 400 MG tablet Take 400 mg by mouth every 4 (four) hours as needed for moderate pain.    Marland Kitchen levothyroxine (SYNTHROID, LEVOTHROID) 200 MCG tablet Take 1 tablet (200 mcg total) by  mouth daily before breakfast. 90 tablet 1  . levothyroxine (SYNTHROID, LEVOTHROID) 25 MCG tablet Take 1.5 tablets (37.5 mcg total) by mouth daily. (Patient taking differently: Take 25 mcg by mouth daily. ) 135 tablet 1  . LORazepam (ATIVAN) 0.5 MG tablet Take 1 tablet (0.5 mg total) by mouth every 8 (eight) hours as needed (nausea). 30 tablet 0  . ondansetron (ZOFRAN) 4 MG tablet Take 1 tablet (4 mg total) by mouth every 8 (eight) hours as needed for nausea or vomiting. 30 tablet 0  . oxyCODONE (ROXICODONE) 5 MG immediate release tablet Take 1-2 tablets (5-10 mg total) by mouth every 4 (four) hours as needed for severe pain. 50 tablet 0  . prochlorperazine (COMPAZINE) 10 MG tablet Take 1 tablet (10 mg total) by mouth every 6 (six) hours as needed for nausea or vomiting. 30 tablet 0  . sennosides-docusate sodium (SENOKOT-S) 8.6-50 MG tablet Take 2 tablets by mouth daily. 30 tablet 1  . tamsulosin (FLOMAX) 0.4 MG CAPS capsule Take 0.4 mg daily by mouth.     . zolpidem (AMBIEN) 5 MG tablet Take 5 mg by mouth at bedtime as needed for sleep.     No current facility-administered medications for this visit.    Facility-Administered Medications Ordered in Other Visits  Medication Dose Route Frequency Provider Last Rate Last Dose  . sodium chloride flush (NS) 0.9 % injection 10 mL  10 mL Intracatheter PRN Curt Bears, MD   10 mL at 10/02/17 1152    SURGICAL HISTORY:  Past Surgical History:  Procedure Laterality Date  . BRONCHOSCOPY  10/2014  . CARDIAC CATHETERIZATION     05/07/12  . CARDIOVERSION     x2  . COLONOSCOPY    . DG BIOPSY LUNG Left 10/2014   FNA - Adenocarcinoma   . FEMUR IM NAIL Left 02/19/2017  . FEMUR IM NAIL Left 02/19/2017   Procedure: INTRAMEDULLARY (IM) NAIL FEMORAL;  Surgeon: Marchia Bond, MD;  Location: [facility];  Service: Orthopedics;  Laterality: Left;  . IR FLUORO GUIDE PORT INSERTION RIGHT  07/03/2017  . IR US GUIDE VASC ACCESS RIGHT  07/03/2017  . LUNG CANCER SURGERY  Left 12/2014   Wedge Resection   . MULTIPLE TOOTH EXTRACTIONS    . Status post TURP    . TONSILLECTOMY      REVIEW OF SYSTEMS:   Review of Systems  Constitutional: Negative for appetite change, chills, fatigue, fever and unexpected weight change.  HENT:   Negative for mouth sores, nosebleeds, sore throat and trouble swallowing.   Eyes: Negative for eye problems and icterus.  Respiratory: Negative for cough, hemoptysis, shortness of breath and wheezing.   Cardiovascular: Negative for chest pain and leg swelling.  Gastrointestinal: Negative for abdominal pain, constipation, diarrhea, nausea and vomiting.  Genitourinary: Negative for bladder incontinence, difficulty urinating, dysuria, frequency and hematuria.   Musculoskeletal: Negative for gait problem, neck pain and neck stiffness. Positive for intermittent back pain and left femur pain. Skin: Negative for itching and rash.  Neurological: Negative for dizziness, extremity weakness, gait problem, headaches, light-headedness and seizures.  Hematological: Negative for adenopathy. Does not bruise/bleed easily.  Psychiatric/Behavioral: Negative for confusion, depression and sleep disturbance. The patient is not nervous/anxious.  PHYSICAL EXAMINATION:  Blood pressure 115/81, pulse 89, temperature 98.2 F (36.8 C), temperature source Oral, resp. rate 18, height 6' 1.5" (1.867 m), weight 232 lb 12.8 oz (105.6 kg), SpO2 100 %.  ECOG PERFORMANCE STATUS: 1 - Symptomatic but completely ambulatory  Physical Exam  Constitutional: Oriented to person, place, and time and well-developed, well-nourished, and in no distress. No distress.  HENT:  Head: Normocephalic and atraumatic.  Mouth/Throat: Oropharynx is clear and moist. No oropharyngeal exudate.  Eyes: Conjunctivae are normal. Right eye exhibits no discharge. Left eye exhibits no discharge. No scleral icterus.  Neck: Normal range of motion. Neck supple.  Cardiovascular: Normal rate, regular  rhythm, normal heart sounds and intact distal pulses.   Pulmonary/Chest: Effort normal and breath sounds normal. No respiratory distress. No wheezes. No rales.  Abdominal: Soft. Bowel sounds are normal. Exhibits no distension and no mass. There is no tenderness.  Musculoskeletal: Normal range of motion. Exhibits no edema.  Lymphadenopathy:    No cervical adenopathy.  Neurological: Alert and oriented to person, place, and time. Exhibits normal muscle tone. Gait normal. Coordination normal.  Skin: Skin is warm and dry. No rash noted. Not diaphoretic. No erythema. No pallor.  Psychiatric: Mood, memory and judgment normal.  Vitals reviewed.  LABORATORY DATA: Lab Results  Component Value Date   WBC 5.8 10/02/2017   HGB 14.3 10/02/2017   HCT 41.5 10/02/2017   MCV 95.8 10/02/2017   PLT 164 10/02/2017      Chemistry      Component Value Date/Time   NA 139 10/02/2017 0826   NA 136 04/04/2017 1141   K 4.0 10/02/2017 0826   K 4.4 04/04/2017 1141   CL 105 10/02/2017 0826   CO2 27 10/02/2017 0826   CO2 24 04/04/2017 1141   BUN 15 10/02/2017 0826   BUN 21.1 04/04/2017 1141   CREATININE 0.87 10/02/2017 0826   CREATININE 1.1 04/04/2017 1141      Component Value Date/Time   CALCIUM 9.8 10/02/2017 0826   CALCIUM 9.1 04/04/2017 1141   ALKPHOS 86 10/02/2017 0826   ALKPHOS 89 04/04/2017 1141   AST 19 10/02/2017 0826   AST 17 04/04/2017 1141   ALT 19 10/02/2017 0826   ALT 18 04/04/2017 1141   BILITOT 0.9 10/02/2017 0826   BILITOT 0.67 04/04/2017 1141       RADIOGRAPHIC STUDIES:  Ct Chest W Contrast  Result Date: 10/01/2017 CLINICAL DATA:  67 year old male with history of left-sided lung cancer. Follow-up study. Pain in the left leg. EXAM: CT CHEST, ABDOMEN, AND PELVIS WITH CONTRAST TECHNIQUE: Multidetector CT imaging of the chest, abdomen and pelvis was performed following the standard protocol during bolus administration of intravenous contrast. CONTRAST:  144m ISOVUE-300 IOPAMIDOL  (ISOVUE-300) INJECTION 61% COMPARISON:  CT the chest, abdomen and pelvis 07/30/2017. FINDINGS: CT CHEST FINDINGS Cardiovascular: Heart size is normal. Small amount of pericardial fluid anteriorly and superiorly, unlikely to be of any hemodynamic significance at this time, similar to the prior examination. No associated pericardial calcification. There is aortic atherosclerosis, as well as atherosclerosis of the great vessels of the mediastinum and the coronary arteries, including calcified atherosclerotic plaque in the left main, left anterior descending and right coronary arteries. Calcifications of the aortic valve. Right internal jugular single-lumen porta cath with tip terminating in the right atrium. Mediastinum/Nodes: Several prominent borderline enlarged mediastinal lymph nodes are again noted measuring up to 11 mm in short axis in the right paratracheal nodal station. No definite hilar lymphadenopathy. Esophagus is unremarkable  in appearance. No axillary lymphadenopathy. Lungs/Pleura: Again noted is a mass-like area of architectural distortion and volume loss in the left lung, centered in the perihilar region, most severe in the central aspect of the left lower lobe, most compatible with progressive postradiation mass-like fibrosis. Scattered areas of septal thickening are noted throughout the lungs bilaterally, similar to the prior examination. Increasing regions of nodular architectural distortion and septal thickening in the right lower lobe are noted, potentially treatment related, although these may simply reflect areas of acute infection/inflammation. No pleural effusions. No other larger more suspicious appearing pulmonary nodules or masses are noted. Diffuse bronchial wall thickening with moderate centrilobular and paraseptal emphysema. Musculoskeletal: Osteotomy changes or old fracture with nonunion in the posterolateral aspect of the left seventh rib. There are no aggressive appearing lytic or  blastic lesions noted in the visualized portions of the skeleton. CT ABDOMEN PELVIS FINDINGS Hepatobiliary: Subcentimeter low-attenuation lesion in segment 2 of the liver, too small to characterize, but statistically likely tiny cysts. No larger more suspicious appearing hepatic lesions. No intra or extrahepatic biliary ductal dilatation. Numerous calcified gallstones lying dependently in the gallbladder. No findings to suggest an acute cholecystitis are noted at this time. Pancreas: No pancreatic mass. No pancreatic ductal dilatation. No pancreatic or peripancreatic fluid or inflammatory changes. Spleen: Unremarkable. Adrenals/Urinary Tract: Simple cysts in the left kidney measuring up to 2.4 cm in the interpolar region. Subcentimeter low-attenuation lesion in the lateral aspect of the interpolar region of the right kidney, too small to characterize, but similar to the prior study, likely a tiny cyst. Bilateral adrenal glands are normal in appearance. No hydroureteronephrosis. Urinary bladder is normal in appearance. Stomach/Bowel: Normal appearance of the stomach. No pathologic dilatation of small bowel or colon. Numerous colonic diverticulae are noted, without surrounding inflammatory changes to suggest an acute diverticulitis at this time. Normal appendix. Vascular/Lymphatic: Aortic atherosclerosis, without evidence of aneurysm or dissection in the abdominal or pelvic vasculature. No lymphadenopathy noted in the abdomen or pelvis. Reproductive: Prostate gland and seminal vesicles are unremarkable in appearance. Other: No significant volume of ascites.  No pneumoperitoneum. Musculoskeletal: There are no aggressive appearing lytic or blastic lesions noted in the visualized portions of the skeleton. Orthopedic fixation hardware in the left proximal femur incompletely imaged. IMPRESSION: 1. Stable post treatment related changes in the left lung, without definitive evidence to suggest local recurrence of disease or  definite metastatic disease in the chest, abdomen or pelvis. Previously noted borderline enlarged mediastinal lymph nodes are stable compared to the prior examination. 2. New patchy areas of ill-defined nodularity and septal thickening in the right lung. These have an appearance suggestive of an infectious or inflammatory process, or could alternatively be treatment related. Attention on follow-up studies is recommended to ensure their stability or resolution. 3. Diffuse bronchial wall thickening with moderate centrilobular and paraseptal emphysema; imaging findings suggestive of underlying COPD. 4. Small amount of pericardial fluid and/or thickening, unchanged compared to the prior study, and unlikely to be of any hemodynamic significance at this time. 5. Cholelithiasis without evidence of acute cholecystitis. 6. Colonic diverticulosis. 7. Additional incidental findings, as above. Electronically Signed   By: Vinnie Langton M.D.   On: 10/01/2017 12:15   Ct Abdomen Pelvis W Contrast  Result Date: 10/01/2017 CLINICAL DATA:  67 year old male with history of left-sided lung cancer. Follow-up study. Pain in the left leg. EXAM: CT CHEST, ABDOMEN, AND PELVIS WITH CONTRAST TECHNIQUE: Multidetector CT imaging of the chest, abdomen and pelvis was performed following the standard  protocol during bolus administration of intravenous contrast. CONTRAST:  182m ISOVUE-300 IOPAMIDOL (ISOVUE-300) INJECTION 61% COMPARISON:  CT the chest, abdomen and pelvis 07/30/2017. FINDINGS: CT CHEST FINDINGS Cardiovascular: Heart size is normal. Small amount of pericardial fluid anteriorly and superiorly, unlikely to be of any hemodynamic significance at this time, similar to the prior examination. No associated pericardial calcification. There is aortic atherosclerosis, as well as atherosclerosis of the great vessels of the mediastinum and the coronary arteries, including calcified atherosclerotic plaque in the left main, left anterior  descending and right coronary arteries. Calcifications of the aortic valve. Right internal jugular single-lumen porta cath with tip terminating in the right atrium. Mediastinum/Nodes: Several prominent borderline enlarged mediastinal lymph nodes are again noted measuring up to 11 mm in short axis in the right paratracheal nodal station. No definite hilar lymphadenopathy. Esophagus is unremarkable in appearance. No axillary lymphadenopathy. Lungs/Pleura: Again noted is a mass-like area of architectural distortion and volume loss in the left lung, centered in the perihilar region, most severe in the central aspect of the left lower lobe, most compatible with progressive postradiation mass-like fibrosis. Scattered areas of septal thickening are noted throughout the lungs bilaterally, similar to the prior examination. Increasing regions of nodular architectural distortion and septal thickening in the right lower lobe are noted, potentially treatment related, although these may simply reflect areas of acute infection/inflammation. No pleural effusions. No other larger more suspicious appearing pulmonary nodules or masses are noted. Diffuse bronchial wall thickening with moderate centrilobular and paraseptal emphysema. Musculoskeletal: Osteotomy changes or old fracture with nonunion in the posterolateral aspect of the left seventh rib. There are no aggressive appearing lytic or blastic lesions noted in the visualized portions of the skeleton. CT ABDOMEN PELVIS FINDINGS Hepatobiliary: Subcentimeter low-attenuation lesion in segment 2 of the liver, too small to characterize, but statistically likely tiny cysts. No larger more suspicious appearing hepatic lesions. No intra or extrahepatic biliary ductal dilatation. Numerous calcified gallstones lying dependently in the gallbladder. No findings to suggest an acute cholecystitis are noted at this time. Pancreas: No pancreatic mass. No pancreatic ductal dilatation. No  pancreatic or peripancreatic fluid or inflammatory changes. Spleen: Unremarkable. Adrenals/Urinary Tract: Simple cysts in the left kidney measuring up to 2.4 cm in the interpolar region. Subcentimeter low-attenuation lesion in the lateral aspect of the interpolar region of the right kidney, too small to characterize, but similar to the prior study, likely a tiny cyst. Bilateral adrenal glands are normal in appearance. No hydroureteronephrosis. Urinary bladder is normal in appearance. Stomach/Bowel: Normal appearance of the stomach. No pathologic dilatation of small bowel or colon. Numerous colonic diverticulae are noted, without surrounding inflammatory changes to suggest an acute diverticulitis at this time. Normal appendix. Vascular/Lymphatic: Aortic atherosclerosis, without evidence of aneurysm or dissection in the abdominal or pelvic vasculature. No lymphadenopathy noted in the abdomen or pelvis. Reproductive: Prostate gland and seminal vesicles are unremarkable in appearance. Other: No significant volume of ascites.  No pneumoperitoneum. Musculoskeletal: There are no aggressive appearing lytic or blastic lesions noted in the visualized portions of the skeleton. Orthopedic fixation hardware in the left proximal femur incompletely imaged. IMPRESSION: 1. Stable post treatment related changes in the left lung, without definitive evidence to suggest local recurrence of disease or definite metastatic disease in the chest, abdomen or pelvis. Previously noted borderline enlarged mediastinal lymph nodes are stable compared to the prior examination. 2. New patchy areas of ill-defined nodularity and septal thickening in the right lung. These have an appearance suggestive of an infectious or  inflammatory process, or could alternatively be treatment related. Attention on follow-up studies is recommended to ensure their stability or resolution. 3. Diffuse bronchial wall thickening with moderate centrilobular and paraseptal  emphysema; imaging findings suggestive of underlying COPD. 4. Small amount of pericardial fluid and/or thickening, unchanged compared to the prior study, and unlikely to be of any hemodynamic significance at this time. 5. Cholelithiasis without evidence of acute cholecystitis. 6. Colonic diverticulosis. 7. Additional incidental findings, as above. Electronically Signed   By: Vinnie Langton M.D.   On: 10/01/2017 12:15   Ct Femur Left W Contrast  Result Date: 10/01/2017 CLINICAL DATA:  Left upper leg pain and soreness in a patient with a history of metastatic lung carcinoma. No known injury. Subsequent encounter. EXAM: CT OF THE LOWER RIGHT EXTREMITY WITH CONTRAST TECHNIQUE: Multidetector CT imaging of the lower right extremity was performed according to the standard protocol following intravenous contrast administration. COMPARISON:  CT scan left upper leg 05/07/2017. CONTRAST:  100 ml ISOVUE-300 IOPAMIDOL (ISOVUE-300) INJECTION 61% FINDINGS: Bones/Joint/Cartilage Intramedullary nail in the left femur traversing a lytic lesion in the distal diaphysis is again seen. On today's examination, the lesion measures approximately 7 cm craniocaudal on coronal image 42 compared to 11.2 cm on the prior examination. There is progressive periosteal new bone formation about the lesion. Small cortical defects anteriorly and posteriorly on the prior examination now have bridging bone about them. No new lytic lesion or other new bony abnormality is seen. Ligaments Suboptimally assessed by CT. Muscles and Tendons Intact and normal appearance. No intramuscular mass or fluid collection. Soft tissues Intrapelvic contents demonstrate mild sigmoid diverticulosis. Small fat containing left inguinal hernia is noted. Scattered atherosclerotic calcifications are identified. IMPRESSION: Improved appearance of a lytic lesion in the mid to distal diaphysis of the left femur with an intramedullary nail in place. The lesion is smaller in size  and demonstrates new bone formation. No new abnormality. Electronically Signed   By: Inge Rise M.D.   On: 10/01/2017 10:05     ASSESSMENT/PLAN:  Adenocarcinoma of left lung, stage 4 [facility]) This is a 67 year old white male with metastatic non-small cell lung cancer, adenocarcinoma with no actionable mutations and PDL 1 expression of 5% that was initially diagnosed as stage IIIa non-small cell lung cancer, adenocarcinoma status post left lower lobectomy with lymph node dissection followed by a course of concurrent chemoradiation completed in January 2016.  The patient had evidence for disease metastasis in October 2018 with metastatic disease to the left femur as well as left supraclavicular and right paratracheal lymph nodes. The patient is currently on systemic chemotherapy initially was with carboplatin, Alimta and Keytruda.  Carboplatin was discontinued secondary to hypersensitivity reaction starting from cycle #2.   He was also treated with 3 cycles of maintenance Alimta and Ketruda (pembrolizumab) but Alimta was discontinued secondary to intolerance. He is currently undergoing treatment with maintenance Keytruda as a single agent status post 5 cycles.  The patient continues to tolerate this treatment well with no concerning complaints.   He had a restaging CT scan of the chest, abdomen, pelvis as well as a CT of the left femur and is here to discuss the results.  The patient was seen with Dr. Julien Nordmann.  CT scan results were discussed with patient and his wife which showed no evidence of disease progression.  Recommend for him to continue on treatment with single agent Keytruda.  He will proceed with cycle #6 today as scheduled. The patient will follow-up in  3 weeks for evaluation prior to cycle #7.  The patient was advised to call immediately if he has any concerning symptoms in the interval. The patient voices understanding of current disease status and treatment options and is in  agreement with the current care plan. All questions were answered. The patient knows to call the clinic with any problems, questions or concerns. We can certainly see the patient much sooner if necessary.   Orders Placed This Encounter  Procedures  . CBC with Differential (Cancer Center Only)    Standing Status:   Standing    Number of Occurrences:   20    Standing Expiration Date:   10/03/2018  . CMP ([facility] only)    Standing Status:   Standing    Number of Occurrences:   20    Standing Expiration Date:   10/03/2018  . TSH    Standing Status:   Standing    Number of Occurrences:   20    Standing Expiration Date:   10/03/2018   Mikey Bussing, DNP, AGPCNP-BC, AOCNP 10/02/17  ADDENDUM: Hematology/Oncology Attending: I had a face-to-face encounter with the patient.  I recommended his care plan.  This is a very pleasant 67 years old white male with metastatic non-small cell lung cancer, adenocarcinoma.  He is status post induction systemic chemotherapy with carboplatin, Alimta and Ketruda (pembrolizumab).  He has a rough time with the chemotherapy and he is currently on maintenance treatment with single agent Ketruda (pembrolizumab) status post 6 cycles.  The patient is feeling fine and continues to tolerate his treatment much better.  He had repeat CT scan of the chest, abdomen and pelvis as well as CT scan of the left femur.  His a scan showed no concerning findings for disease progression. I personally and independently reviewed the scans and discussed the results with the patient and his wife. I recommended for the patient to continue his current treatment with single agent Ketruda (pembrolizumab) and he will proceed with cycle #7 today. For the hypothyroidism, he will continue his current treatment with levothyroxine. The patient will come back for follow-up visit in 3 weeks for evaluation before the next cycle of his treatment. He was advised to call immediately if he has any  concerning symptoms in the interval.  Disclaimer: This note was dictated with voice recognition software. Similar sounding words can inadvertently be transcribed and may be missed upon review. Eilleen Kempf, MD 10/06/17

## 2017-10-24 ENCOUNTER — Inpatient Hospital Stay: Payer: Medicare HMO

## 2017-10-24 ENCOUNTER — Telehealth: Payer: Self-pay | Admitting: Internal Medicine

## 2017-10-24 ENCOUNTER — Encounter: Payer: Self-pay | Admitting: Internal Medicine

## 2017-10-24 ENCOUNTER — Inpatient Hospital Stay (HOSPITAL_BASED_OUTPATIENT_CLINIC_OR_DEPARTMENT_OTHER): Payer: Medicare HMO | Admitting: Internal Medicine

## 2017-10-24 VITALS — BP 110/60 | HR 65 | Temp 98.1°F | Resp 17 | Ht 73.5 in | Wt 236.7 lb

## 2017-10-24 DIAGNOSIS — Z9221 Personal history of antineoplastic chemotherapy: Secondary | ICD-10-CM

## 2017-10-24 DIAGNOSIS — C3492 Malignant neoplasm of unspecified part of left bronchus or lung: Secondary | ICD-10-CM

## 2017-10-24 DIAGNOSIS — C7951 Secondary malignant neoplasm of bone: Secondary | ICD-10-CM | POA: Diagnosis not present

## 2017-10-24 DIAGNOSIS — E039 Hypothyroidism, unspecified: Secondary | ICD-10-CM

## 2017-10-24 DIAGNOSIS — C3432 Malignant neoplasm of lower lobe, left bronchus or lung: Secondary | ICD-10-CM | POA: Diagnosis not present

## 2017-10-24 DIAGNOSIS — Z5112 Encounter for antineoplastic immunotherapy: Secondary | ICD-10-CM

## 2017-10-24 DIAGNOSIS — Z79899 Other long term (current) drug therapy: Secondary | ICD-10-CM

## 2017-10-24 DIAGNOSIS — Z902 Acquired absence of lung [part of]: Secondary | ICD-10-CM | POA: Diagnosis not present

## 2017-10-24 DIAGNOSIS — C770 Secondary and unspecified malignant neoplasm of lymph nodes of head, face and neck: Secondary | ICD-10-CM

## 2017-10-24 DIAGNOSIS — Z923 Personal history of irradiation: Secondary | ICD-10-CM

## 2017-10-24 DIAGNOSIS — Z95828 Presence of other vascular implants and grafts: Secondary | ICD-10-CM

## 2017-10-24 LAB — CMP (CANCER CENTER ONLY)
ALK PHOS: 93 U/L (ref 38–126)
ALT: 19 U/L (ref 0–44)
AST: 19 U/L (ref 15–41)
Albumin: 3.8 g/dL (ref 3.5–5.0)
Anion gap: 8 (ref 5–15)
BUN: 16 mg/dL (ref 8–23)
CALCIUM: 9.3 mg/dL (ref 8.9–10.3)
CO2: 26 mmol/L (ref 22–32)
CREATININE: 0.88 mg/dL (ref 0.61–1.24)
Chloride: 105 mmol/L (ref 98–111)
GFR, Estimated: >60 mL/min (ref >60)
GLUCOSE: 157 mg/dL — AB (ref 70–99)
Potassium: 4.3 mmol/L (ref 3.5–5.1)
SODIUM: 139 mmol/L (ref 135–145)
Total Bilirubin: 0.8 mg/dL (ref 0.3–1.2)
Total Protein: 6.3 g/dL — ABNORMAL LOW (ref 6.5–8.1)

## 2017-10-24 LAB — CBC WITH DIFFERENTIAL (CANCER CENTER ONLY)
BASOS PCT: 0 %
Basophils Absolute: 0 10*3/uL (ref 0.0–0.1)
EOS ABS: 0.1 10*3/uL (ref 0.0–0.5)
Eosinophils Relative: 2 %
HCT: 41.3 % (ref 38.4–49.9)
Hemoglobin: 14.3 g/dL (ref 13.0–17.1)
Lymphocytes Relative: 18 %
Lymphs Abs: 1.1 10*3/uL (ref 0.9–3.3)
MCH: 33.3 pg (ref 27.2–33.4)
MCHC: 34.8 g/dL (ref 32.0–36.0)
MCV: 95.6 fL (ref 79.3–98.0)
MONOS PCT: 8 %
Monocytes Absolute: 0.5 10*3/uL (ref 0.1–0.9)
Neutro Abs: 4.5 10*3/uL (ref 1.5–6.5)
Neutrophils Relative %: 72 %
PLATELETS: 148 10*3/uL (ref 140–400)
RBC: 4.31 MIL/uL (ref 4.20–5.82)
RDW: 13.7 % (ref 11.0–14.6)
WBC Count: 6.2 10*3/uL (ref 4.0–10.3)

## 2017-10-24 LAB — TSH

## 2017-10-24 MED ORDER — SODIUM CHLORIDE 0.9% FLUSH
10.0000 mL | INTRAVENOUS | Status: DC | PRN
  Administered 2017-10-24: 10 mL
  Filled 2017-10-24: qty 10

## 2017-10-24 MED ORDER — HEPARIN SOD (PORK) LOCK FLUSH 100 UNIT/ML IV SOLN
500.0000 [IU] | Freq: Once | INTRAVENOUS | Status: AC | PRN
  Administered 2017-10-24: 500 [IU]
  Filled 2017-10-24: qty 5

## 2017-10-24 MED ORDER — SODIUM CHLORIDE 0.9 % IV SOLN
200.0000 mg | Freq: Once | INTRAVENOUS | Status: AC
  Administered 2017-10-24: 200 mg via INTRAVENOUS
  Filled 2017-10-24: qty 8

## 2017-10-24 MED ORDER — SODIUM CHLORIDE 0.9 % IV SOLN
Freq: Once | INTRAVENOUS | Status: AC
  Administered 2017-10-24: 10:00:00 via INTRAVENOUS
  Filled 2017-10-24: qty 250

## 2017-10-24 MED ORDER — SODIUM CHLORIDE 0.9 % IV SOLN
Freq: Once | INTRAVENOUS | Status: DC
  Filled 2017-10-24: qty 250

## 2017-10-24 NOTE — Progress Notes (Signed)
[facility] [phone]   [phone]  OFFICE PROGRESS NOTE  [name], MD [address], [place] [address]  DIAGNOSIS: Metastatic non-small cell lung cancer initially diagnosed as stage IIIA (T2a, N2, M0) non-small cell lung cancer, poorly differentiated adenocarcinoma presented with left lower lobe lung mass in addition to mediastinal lymphadenopathy.  The patient was diagnosed with metastatic disease involving the left femur as well as left supraclavicular nodal metastases and right paratracheal lymphadenopathy in October 2018.  Biomarker Findings Microsatellite Status - MS-Stable Tumor Mutational Burden - TMB-Low (3 Muts/Mb) Genomic Findings For a complete list of the genes assayed, please refer to the Appendix. STK11 P254f*6 [id number] site 1122+4S>LDAXX E374* MLL2 [id number] NBN K23359f NOTCH2 [id number] splice site 988110+2T>RDisease relevant genes with no reportable alterations: EGFR, KRAS, ALK, BRAF, MET, RET, ERBB2, ROS1   PDL1 expression 5%  PRIOR THERAPY: 1) status post wedge resection of the left lower lobe lung mass as well as AP window lymph node dissection but there was residual metastatic mediastinal lymphadenopathy that could not be resected. 2) a course of concurrent chemorad[place]  3) status post palliative radiotherapy to the left femur metastatic bone disease. 4)  Systemic chemotherapy with carboplatin for AUC of 5, Alimta 500 mg/M2 and Keytruda 200 mg IV every 3 weeks.  First dose March 07, 2017.  Carboplatin was discontinued during cycle #2 secondary to hypersensitivity reaction. 5) status post 2 cycles of maintenance treatment with Alimta and Ketruda (pembrolizumab).  Alimta was discontinued secondary to intolerance.  CURRENT THERAPY: Maintenance treatment with single agent Ketruda (pembrolizumab) status post 6  cycles.  INTERVAL HISTORY: Victor Huber 16o. male returns to the clinic today for follow-up visit accompanied by his son.  The patient is feeling fine today with no concerning complaints.  He continues to tolerate his treatment with Keytruda fairly well.  He denied having any chest pain, shortness of breath, cough or hemoptysis.  He denied having any fever or chills.  He has no nausea, vomiting, diarrhea or constipation.  He has no skin rash.  He is here today for evaluation before starting cycle #7.   MEDICAL HISTORY: Past Medical History:  Diagnosis Date  . Adenocarcinoma of left lung, stage 3 ([place]/16/[place]  . Arthritis   . Atrial fibrillation ([place]16/[place]  . Atrial fibrillation ([place] . COPD (chronic obstructive pulmonary disease) ([place]/16/2018  . History of chemotherapy   . History of radiation therapy   . Hyperlipidemia   . Hypothyroid 08/15/2016  . Longstanding persistent atrial fibrillation ([place]/30/2018  . Pathologic fracture    left femur  . Pneumonitis   . S/P TURP 08/15/2016  . Wears glasses   . Wears hearing aid in both ears     ALLERGIES:  is allergic to carboplatin and flecainide.  MEDICATIONS:  Current Outpatient Medications  Medication Sig Dispense Refill  . apixaban (ELIQUIS) 5 MG TABS tablet Take 5 mg by mouth 2 (two) times daily.    . aMarland Kitchenenolol (TENORMIN) 50 MG tablet TAKE 1 TABLET DAILY (DISCONTINUE METOPROLOL TART 50MG) 90 tablet 3  . atorvastatin (LIPITOR) 40 MG tablet Take 40 mg by mouth daily.    . baclofen (LIORESAL) 10 MG tablet Take 1 tablet (10 mg total) by mouth 3 (three) times daily. As needed for muscle spasm 50 tablet 0  . citalopram (CELEXA) 40 MG tablet Take 40  mg by mouth daily.    Marland Kitchen ibuprofen (ADVIL,MOTRIN) 400 MG tablet Take 400 mg by mouth every 4 (four) hours as needed for moderate pain.    Marland Kitchen levothyroxine (SYNTHROID, LEVOTHROID) 200 MCG tablet Take 1 tablet (200 mcg total) by mouth daily before breakfast. 90 tablet 1  .  levothyroxine (SYNTHROID, LEVOTHROID) 25 MCG tablet Take 1.5 tablets (37.5 mcg total) by mouth daily. (Patient taking differently: Take 25 mcg by mouth daily. ) 135 tablet 1  . sennosides-docusate sodium (SENOKOT-S) 8.6-50 MG tablet Take 2 tablets by mouth daily. 30 tablet 1  . tamsulosin (FLOMAX) 0.4 MG CAPS capsule Take 0.4 mg daily by mouth.     . zolpidem (AMBIEN) 5 MG tablet Take 5 mg by mouth at bedtime as needed for sleep.    Marland Kitchen LORazepam (ATIVAN) 0.5 MG tablet Take 1 tablet (0.5 mg total) by mouth every 8 (eight) hours as needed (nausea). (Patient not taking: Reported on 10/24/2017) 30 tablet 0  . ondansetron (ZOFRAN) 4 MG tablet Take 1 tablet (4 mg total) by mouth every 8 (eight) hours as needed for nausea or vomiting. (Patient not taking: Reported on 10/24/2017) 30 tablet 0  . oxyCODONE (ROXICODONE) 5 MG immediate release tablet Take 1-2 tablets (5-10 mg total) by mouth every 4 (four) hours as needed for severe pain. (Patient not taking: Reported on 10/24/2017) 50 tablet 0  . prochlorperazine (COMPAZINE) 10 MG tablet Take 1 tablet (10 mg total) by mouth every 6 (six) hours as needed for nausea or vomiting. (Patient not taking: Reported on 10/24/2017) 30 tablet 0   No current facility-administered medications for this visit.     SURGICAL HISTORY:  Past Surgical History:  Procedure Laterality Date  . BRONCHOSCOPY  10/2014  . CARDIAC CATHETERIZATION     05/07/12  . CARDIOVERSION     x2  . COLONOSCOPY    . DG BIOPSY LUNG Left 10/2014   FNA - Adenocarcinoma   . FEMUR IM NAIL Left 02/19/2017  . FEMUR IM NAIL Left 02/19/2017   Procedure: INTRAMEDULLARY (IM) NAIL FEMORAL;  Surgeon: Marchia Bond, MD;  Location: [facility];  Service: Orthopedics;  Laterality: Left;  . IR FLUORO GUIDE PORT INSERTION RIGHT  07/03/2017  . IR US GUIDE VASC ACCESS RIGHT  07/03/2017  . LUNG CANCER SURGERY Left 12/2014   Wedge Resection   . MULTIPLE TOOTH EXTRACTIONS    . Status post TURP    . TONSILLECTOMY      REVIEW  OF SYSTEMS:  A comprehensive review of systems was negative.   PHYSICAL EXAMINATION: General appearance: alert, cooperative and no distress Head: Normocephalic, without obvious abnormality, atraumatic Neck: no adenopathy, no JVD, supple, symmetrical, trachea midline and thyroid not enlarged, symmetric, no tenderness/mass/nodules Lymph nodes: Cervical, supraclavicular, and axillary nodes normal. Resp: clear to auscultation bilaterally Back: symmetric, no curvature. ROM normal. No CVA tenderness. Cardio: regular rate and rhythm, S1, S2 normal, no murmur, click, rub or gallop GI: soft, non-tender; bowel sounds normal; no masses,  no organomegaly Extremities: extremities normal, atraumatic, no cyanosis or edema  ECOG PERFORMANCE STATUS: 0 - Asymptomatic  Blood pressure 110/60, pulse 65, temperature 98.1 F (36.7 C), temperature source Oral, resp. rate 17, height 6' 1.5" (1.867 m), weight 236 lb 11.2 oz (107.4 kg), SpO2 98 %.  LABORATORY DATA: Lab Results  Component Value Date   WBC 6.2 10/24/2017   HGB 14.3 10/24/2017   HCT 41.3 10/24/2017   MCV 95.6 10/24/2017   PLT 148 10/24/2017  Chemistry      Component Value Date/Time   NA 139 10/02/2017 0826   NA 136 04/04/2017 1141   K 4.0 10/02/2017 0826   K 4.4 04/04/2017 1141   CL 105 10/02/2017 0826   CO2 27 10/02/2017 0826   CO2 24 04/04/2017 1141   BUN 15 10/02/2017 0826   BUN 21.1 04/04/2017 1141   CREATININE 0.87 10/02/2017 0826   CREATININE 1.1 04/04/2017 1141      Component Value Date/Time   CALCIUM 9.8 10/02/2017 0826   CALCIUM 9.1 04/04/2017 1141   ALKPHOS 86 10/02/2017 0826   ALKPHOS 89 04/04/2017 1141   AST 19 10/02/2017 0826   AST 17 04/04/2017 1141   ALT 19 10/02/2017 0826   ALT 18 04/04/2017 1141   BILITOT 0.9 10/02/2017 0826   BILITOT 0.67 04/04/2017 1141       RADIOGRAPHIC STUDIES: Ct Chest W Contrast  Result Date: 10/01/2017 CLINICAL DATA:  67 year old male with history of left-sided lung cancer.  Follow-up study. Pain in the left leg. EXAM: CT CHEST, ABDOMEN, AND PELVIS WITH CONTRAST TECHNIQUE: Multidetector CT imaging of the chest, abdomen and pelvis was performed following the standard protocol during bolus administration of intravenous contrast. CONTRAST:  113m ISOVUE-300 IOPAMIDOL (ISOVUE-300) INJECTION 61% COMPARISON:  CT the chest, abdomen and pelvis 07/30/2017. FINDINGS: CT CHEST FINDINGS Cardiovascular: Heart size is normal. Small amount of pericardial fluid anteriorly and superiorly, unlikely to be of any hemodynamic significance at this time, similar to the prior examination. No associated pericardial calcification. There is aortic atherosclerosis, as well as atherosclerosis of the great vessels of the mediastinum and the coronary arteries, including calcified atherosclerotic plaque in the left main, left anterior descending and right coronary arteries. Calcifications of the aortic valve. Right internal jugular single-lumen porta cath with tip terminating in the right atrium. Mediastinum/Nodes: Several prominent borderline enlarged mediastinal lymph nodes are again noted measuring up to 11 mm in short axis in the right paratracheal nodal station. No definite hilar lymphadenopathy. Esophagus is unremarkable in appearance. No axillary lymphadenopathy. Lungs/Pleura: Again noted is a mass-like area of architectural distortion and volume loss in the left lung, centered in the perihilar region, most severe in the central aspect of the left lower lobe, most compatible with progressive postradiation mass-like fibrosis. Scattered areas of septal thickening are noted throughout the lungs bilaterally, similar to the prior examination. Increasing regions of nodular architectural distortion and septal thickening in the right lower lobe are noted, potentially treatment related, although these may simply reflect areas of acute infection/inflammation. No pleural effusions. No other larger more suspicious  appearing pulmonary nodules or masses are noted. Diffuse bronchial wall thickening with moderate centrilobular and paraseptal emphysema. Musculoskeletal: Osteotomy changes or old fracture with nonunion in the posterolateral aspect of the left seventh rib. There are no aggressive appearing lytic or blastic lesions noted in the visualized portions of the skeleton. CT ABDOMEN PELVIS FINDINGS Hepatobiliary: Subcentimeter low-attenuation lesion in segment 2 of the liver, too small to characterize, but statistically likely tiny cysts. No larger more suspicious appearing hepatic lesions. No intra or extrahepatic biliary ductal dilatation. Numerous calcified gallstones lying dependently in the gallbladder. No findings to suggest an acute cholecystitis are noted at this time. Pancreas: No pancreatic mass. No pancreatic ductal dilatation. No pancreatic or peripancreatic fluid or inflammatory changes. Spleen: Unremarkable. Adrenals/Urinary Tract: Simple cysts in the left kidney measuring up to 2.4 cm in the interpolar region. Subcentimeter low-attenuation lesion in the lateral aspect of the interpolar region of the right  kidney, too small to characterize, but similar to the prior study, likely a tiny cyst. Bilateral adrenal glands are normal in appearance. No hydroureteronephrosis. Urinary bladder is normal in appearance. Stomach/Bowel: Normal appearance of the stomach. No pathologic dilatation of small bowel or colon. Numerous colonic diverticulae are noted, without surrounding inflammatory changes to suggest an acute diverticulitis at this time. Normal appendix. Vascular/Lymphatic: Aortic atherosclerosis, without evidence of aneurysm or dissection in the abdominal or pelvic vasculature. No lymphadenopathy noted in the abdomen or pelvis. Reproductive: Prostate gland and seminal vesicles are unremarkable in appearance. Other: No significant volume of ascites.  No pneumoperitoneum. Musculoskeletal: There are no aggressive  appearing lytic or blastic lesions noted in the visualized portions of the skeleton. Orthopedic fixation hardware in the left proximal femur incompletely imaged. IMPRESSION: 1. Stable post treatment related changes in the left lung, without definitive evidence to suggest local recurrence of disease or definite metastatic disease in the chest, abdomen or pelvis. Previously noted borderline enlarged mediastinal lymph nodes are stable compared to the prior examination. 2. New patchy areas of ill-defined nodularity and septal thickening in the right lung. These have an appearance suggestive of an infectious or inflammatory process, or could alternatively be treatment related. Attention on follow-up studies is recommended to ensure their stability or resolution. 3. Diffuse bronchial wall thickening with moderate centrilobular and paraseptal emphysema; imaging findings suggestive of underlying COPD. 4. Small amount of pericardial fluid and/or thickening, unchanged compared to the prior study, and unlikely to be of any hemodynamic significance at this time. 5. Cholelithiasis without evidence of acute cholecystitis. 6. Colonic diverticulosis. 7. Additional incidental findings, as above. Electronically Signed   By: Vinnie Langton M.D.   On: 10/01/2017 12:15   Ct Abdomen Pelvis W Contrast  Result Date: 10/01/2017 CLINICAL DATA:  67 year old male with history of left-sided lung cancer. Follow-up study. Pain in the left leg. EXAM: CT CHEST, ABDOMEN, AND PELVIS WITH CONTRAST TECHNIQUE: Multidetector CT imaging of the chest, abdomen and pelvis was performed following the standard protocol during bolus administration of intravenous contrast. CONTRAST:  171m ISOVUE-300 IOPAMIDOL (ISOVUE-300) INJECTION 61% COMPARISON:  CT the chest, abdomen and pelvis 07/30/2017. FINDINGS: CT CHEST FINDINGS Cardiovascular: Heart size is normal. Small amount of pericardial fluid anteriorly and superiorly, unlikely to be of any hemodynamic  significance at this time, similar to the prior examination. No associated pericardial calcification. There is aortic atherosclerosis, as well as atherosclerosis of the great vessels of the mediastinum and the coronary arteries, including calcified atherosclerotic plaque in the left main, left anterior descending and right coronary arteries. Calcifications of the aortic valve. Right internal jugular single-lumen porta cath with tip terminating in the right atrium. Mediastinum/Nodes: Several prominent borderline enlarged mediastinal lymph nodes are again noted measuring up to 11 mm in short axis in the right paratracheal nodal station. No definite hilar lymphadenopathy. Esophagus is unremarkable in appearance. No axillary lymphadenopathy. Lungs/Pleura: Again noted is a mass-like area of architectural distortion and volume loss in the left lung, centered in the perihilar region, most severe in the central aspect of the left lower lobe, most compatible with progressive postradiation mass-like fibrosis. Scattered areas of septal thickening are noted throughout the lungs bilaterally, similar to the prior examination. Increasing regions of nodular architectural distortion and septal thickening in the right lower lobe are noted, potentially treatment related, although these may simply reflect areas of acute infection/inflammation. No pleural effusions. No other larger more suspicious appearing pulmonary nodules or masses are noted. Diffuse bronchial wall thickening with moderate  centrilobular and paraseptal emphysema. Musculoskeletal: Osteotomy changes or old fracture with nonunion in the posterolateral aspect of the left seventh rib. There are no aggressive appearing lytic or blastic lesions noted in the visualized portions of the skeleton. CT ABDOMEN PELVIS FINDINGS Hepatobiliary: Subcentimeter low-attenuation lesion in segment 2 of the liver, too small to characterize, but statistically likely tiny cysts. No larger more  suspicious appearing hepatic lesions. No intra or extrahepatic biliary ductal dilatation. Numerous calcified gallstones lying dependently in the gallbladder. No findings to suggest an acute cholecystitis are noted at this time. Pancreas: No pancreatic mass. No pancreatic ductal dilatation. No pancreatic or peripancreatic fluid or inflammatory changes. Spleen: Unremarkable. Adrenals/Urinary Tract: Simple cysts in the left kidney measuring up to 2.4 cm in the interpolar region. Subcentimeter low-attenuation lesion in the lateral aspect of the interpolar region of the right kidney, too small to characterize, but similar to the prior study, likely a tiny cyst. Bilateral adrenal glands are normal in appearance. No hydroureteronephrosis. Urinary bladder is normal in appearance. Stomach/Bowel: Normal appearance of the stomach. No pathologic dilatation of small bowel or colon. Numerous colonic diverticulae are noted, without surrounding inflammatory changes to suggest an acute diverticulitis at this time. Normal appendix. Vascular/Lymphatic: Aortic atherosclerosis, without evidence of aneurysm or dissection in the abdominal or pelvic vasculature. No lymphadenopathy noted in the abdomen or pelvis. Reproductive: Prostate gland and seminal vesicles are unremarkable in appearance. Other: No significant volume of ascites.  No pneumoperitoneum. Musculoskeletal: There are no aggressive appearing lytic or blastic lesions noted in the visualized portions of the skeleton. Orthopedic fixation hardware in the left proximal femur incompletely imaged. IMPRESSION: 1. Stable post treatment related changes in the left lung, without definitive evidence to suggest local recurrence of disease or definite metastatic disease in the chest, abdomen or pelvis. Previously noted borderline enlarged mediastinal lymph nodes are stable compared to the prior examination. 2. New patchy areas of ill-defined nodularity and septal thickening in the right  lung. These have an appearance suggestive of an infectious or inflammatory process, or could alternatively be treatment related. Attention on follow-up studies is recommended to ensure their stability or resolution. 3. Diffuse bronchial wall thickening with moderate centrilobular and paraseptal emphysema; imaging findings suggestive of underlying COPD. 4. Small amount of pericardial fluid and/or thickening, unchanged compared to the prior study, and unlikely to be of any hemodynamic significance at this time. 5. Cholelithiasis without evidence of acute cholecystitis. 6. Colonic diverticulosis. 7. Additional incidental findings, as above. Electronically Signed   By: Vinnie Langton M.D.   On: 10/01/2017 12:15   Ct Femur Left W Contrast  Result Date: 10/01/2017 CLINICAL DATA:  Left upper leg pain and soreness in a patient with a history of metastatic lung carcinoma. No known injury. Subsequent encounter. EXAM: CT OF THE LOWER RIGHT EXTREMITY WITH CONTRAST TECHNIQUE: Multidetector CT imaging of the lower right extremity was performed according to the standard protocol following intravenous contrast administration. COMPARISON:  CT scan left upper leg 05/07/2017. CONTRAST:  100 ml ISOVUE-300 IOPAMIDOL (ISOVUE-300) INJECTION 61% FINDINGS: Bones/Joint/Cartilage Intramedullary nail in the left femur traversing a lytic lesion in the distal diaphysis is again seen. On today's examination, the lesion measures approximately 7 cm craniocaudal on coronal image 42 compared to 11.2 cm on the prior examination. There is progressive periosteal new bone formation about the lesion. Small cortical defects anteriorly and posteriorly on the prior examination now have bridging bone about them. No new lytic lesion or other new bony abnormality is seen. Ligaments Suboptimally assessed  by CT. Muscles and Tendons Intact and normal appearance. No intramuscular mass or fluid collection. Soft tissues Intrapelvic contents demonstrate mild  sigmoid diverticulosis. Small fat containing left inguinal hernia is noted. Scattered atherosclerotic calcifications are identified. IMPRESSION: Improved appearance of a lytic lesion in the mid to distal diaphysis of the left femur with an intramedullary nail in place. The lesion is smaller in size and demonstrates new bone formation. No new abnormality. Electronically Signed   By: Inge Rise M.D.   On: 10/01/2017 10:05    ASSESSMENT AND PLAN: This is a 67 years old white male with metastatic non-small cell lung cancer, adenocarcinoma with no actionable mutations and PDL 1 expression of 5% that was initially diagnosed as stage IIIa non-small cell lung cancer, adenocarcinoma status post left lower lobectomy with lymph node dissection followed by a course of concurrent chemoradiation completed in January 2016.  The patient had evidence for disease metastasis in October 2018 with metastatic disease to the left femur as well as left supraclavicular and right paratracheal lymph nodes. The patient is currently on systemic chemotherapy initially was with carboplatin, Alimta and Keytruda.  Carboplatin was discontinued secondary to hypersensitivity reaction starting from cycle #2.   He was also treated with 3 cycles of maintenance Alimta and Ketruda (pembrolizumab) but Alimta was discontinued secondary to intolerance. He is currently undergoing treatment with maintenance Keytruda as a single agent status post 6 cycles.  He continues to tolerate this treatment well with no concerning complaints. I recommended for the patient to proceed with cycle #7 today as scheduled. I will see him back for follow-up visit in 3 weeks for evaluation before starting cycle #8. He was advised to call immediately if he has any concerning symptoms in the interval. The patient voices understanding of current disease status and treatment options and is in agreement with the current care plan. All questions were answered. The  patient knows to call the clinic with any problems, questions or concerns. We can certainly see the patient much sooner if necessary.  Disclaimer: This note was dictated with voice recognition software. Similar sounding words can inadvertently be transcribed and may not be corrected upon review.

## 2017-10-24 NOTE — Patient Instructions (Signed)
Pembrolizumab injection  What is this medicine?  PEMBROLIZUMAB (pem broe liz ue mab) is a monoclonal antibody. It is used to treat melanoma, head and neck cancer, Hodgkin lymphoma, non-small cell lung cancer, urothelial cancer, stomach cancer, and cancers that have a certain genetic condition.  This medicine may be used for other purposes; ask your health care provider or pharmacist if you have questions.  COMMON BRAND NAME(S): Keytruda  What should I tell my health care provider before I take this medicine?  They need to know if you have any of these conditions:  -diabetes  -immune system problems  -inflammatory bowel disease  -liver disease  -lung or breathing disease  -lupus  -organ transplant  -an unusual or allergic reaction to pembrolizumab, other medicines, foods, dyes, or preservatives  -pregnant or trying to get pregnant  -breast-feeding  How should I use this medicine?  This medicine is for infusion into a vein. It is given by a health care professional in a hospital or clinic setting.  A special MedGuide will be given to you before each treatment. Be sure to read this information carefully each time.  Talk to your pediatrician regarding the use of this medicine in children. While this drug may be prescribed for selected conditions, precautions do apply.  Overdosage: If you think you have taken too much of this medicine contact a poison control center or emergency room at once.  NOTE: This medicine is only for you. Do not share this medicine with others.  What if I miss a dose?  It is important not to miss your dose. Call your doctor or health care professional if you are unable to keep an appointment.  What may interact with this medicine?  Interactions have not been studied.  Give your health care provider a list of all the medicines, herbs, non-prescription drugs, or dietary supplements you use. Also tell them if you smoke, drink alcohol, or use illegal drugs. Some items may interact with your  medicine.  This list may not describe all possible interactions. Give your health care provider a list of all the medicines, herbs, non-prescription drugs, or dietary supplements you use. Also tell them if you smoke, drink alcohol, or use illegal drugs. Some items may interact with your medicine.  What should I watch for while using this medicine?  Your condition will be monitored carefully while you are receiving this medicine.  You may need blood work done while you are taking this medicine.  Do not become pregnant while taking this medicine or for 4 months after stopping it. Women should inform their doctor if they wish to become pregnant or think they might be pregnant. There is a potential for serious side effects to an unborn child. Talk to your health care professional or pharmacist for more information. Do not breast-feed an infant while taking this medicine or for 4 months after the last dose.  What side effects may I notice from receiving this medicine?  Side effects that you should report to your doctor or health care professional as soon as possible:  -allergic reactions like skin rash, itching or hives, swelling of the face, lips, or tongue  -bloody or black, tarry  -breathing problems  -changes in vision  -chest pain  -chills  -constipation  -cough  -dizziness or feeling faint or lightheaded  -fast or irregular heartbeat  -fever  -flushing  -hair loss  -low blood counts - this medicine may decrease the number of white blood cells, red blood cells   and platelets. You may be at increased risk for infections and bleeding.  -muscle pain  -muscle weakness  -persistent headache  -signs and symptoms of high blood sugar such as dizziness; dry mouth; dry skin; fruity breath; nausea; stomach pain; increased hunger or thirst; increased urination  -signs and symptoms of kidney injury like trouble passing urine or change in the amount of urine  -signs and symptoms of liver injury like dark urine, light-colored  stools, loss of appetite, nausea, right upper belly pain, yellowing of the eyes or skin  -stomach pain  -sweating  -weight loss  Side effects that usually do not require medical attention (report to your doctor or health care professional if they continue or are bothersome):  -decreased appetite  -diarrhea  -tiredness  This list may not describe all possible side effects. Call your doctor for medical advice about side effects. You may report side effects to FDA at 1-800-FDA-1088.  Where should I keep my medicine?  This drug is given in a hospital or clinic and will not be stored at home.  NOTE: This sheet is a summary. It may not cover all possible information. If you have questions about this medicine, talk to your doctor, pharmacist, or health care provider.   2018 Elsevier/Gold Standard (2015-12-27 12:29:36)

## 2017-10-24 NOTE — Telephone Encounter (Signed)
Scheduled appt per 7/25 los - pt to get an updated scheduled next visit.

## 2017-11-09 ENCOUNTER — Other Ambulatory Visit: Payer: Self-pay | Admitting: Medical

## 2017-11-13 ENCOUNTER — Other Ambulatory Visit: Payer: Self-pay | Admitting: *Deleted

## 2017-11-14 ENCOUNTER — Inpatient Hospital Stay (HOSPITAL_BASED_OUTPATIENT_CLINIC_OR_DEPARTMENT_OTHER): Payer: Medicare HMO | Admitting: Internal Medicine

## 2017-11-14 ENCOUNTER — Telehealth: Payer: Self-pay | Admitting: Internal Medicine

## 2017-11-14 ENCOUNTER — Inpatient Hospital Stay: Payer: Medicare HMO | Attending: Internal Medicine

## 2017-11-14 ENCOUNTER — Encounter: Payer: Self-pay | Admitting: Internal Medicine

## 2017-11-14 ENCOUNTER — Other Ambulatory Visit: Payer: Self-pay | Admitting: Internal Medicine

## 2017-11-14 ENCOUNTER — Inpatient Hospital Stay: Payer: Medicare HMO

## 2017-11-14 VITALS — BP 110/83 | HR 90 | Temp 97.5°F | Resp 18 | Ht 73.5 in | Wt 236.9 lb

## 2017-11-14 DIAGNOSIS — Z5112 Encounter for antineoplastic immunotherapy: Secondary | ICD-10-CM

## 2017-11-14 DIAGNOSIS — N281 Cyst of kidney, acquired: Secondary | ICD-10-CM | POA: Diagnosis not present

## 2017-11-14 DIAGNOSIS — Z95828 Presence of other vascular implants and grafts: Secondary | ICD-10-CM

## 2017-11-14 DIAGNOSIS — K8020 Calculus of gallbladder without cholecystitis without obstruction: Secondary | ICD-10-CM | POA: Insufficient documentation

## 2017-11-14 DIAGNOSIS — K4090 Unilateral inguinal hernia, without obstruction or gangrene, not specified as recurrent: Secondary | ICD-10-CM | POA: Insufficient documentation

## 2017-11-14 DIAGNOSIS — Z923 Personal history of irradiation: Secondary | ICD-10-CM | POA: Diagnosis not present

## 2017-11-14 DIAGNOSIS — E039 Hypothyroidism, unspecified: Secondary | ICD-10-CM | POA: Diagnosis not present

## 2017-11-14 DIAGNOSIS — J449 Chronic obstructive pulmonary disease, unspecified: Secondary | ICD-10-CM | POA: Diagnosis not present

## 2017-11-14 DIAGNOSIS — C770 Secondary and unspecified malignant neoplasm of lymph nodes of head, face and neck: Secondary | ICD-10-CM | POA: Diagnosis not present

## 2017-11-14 DIAGNOSIS — Z9221 Personal history of antineoplastic chemotherapy: Secondary | ICD-10-CM | POA: Insufficient documentation

## 2017-11-14 DIAGNOSIS — Z79899 Other long term (current) drug therapy: Secondary | ICD-10-CM

## 2017-11-14 DIAGNOSIS — E785 Hyperlipidemia, unspecified: Secondary | ICD-10-CM | POA: Insufficient documentation

## 2017-11-14 DIAGNOSIS — I700 Atherosclerosis of aorta: Secondary | ICD-10-CM | POA: Diagnosis not present

## 2017-11-14 DIAGNOSIS — C3432 Malignant neoplasm of lower lobe, left bronchus or lung: Secondary | ICD-10-CM

## 2017-11-14 DIAGNOSIS — C3490 Malignant neoplasm of unspecified part of unspecified bronchus or lung: Secondary | ICD-10-CM

## 2017-11-14 DIAGNOSIS — C3492 Malignant neoplasm of unspecified part of left bronchus or lung: Secondary | ICD-10-CM

## 2017-11-14 DIAGNOSIS — C7951 Secondary malignant neoplasm of bone: Secondary | ICD-10-CM | POA: Diagnosis not present

## 2017-11-14 DIAGNOSIS — Z902 Acquired absence of lung [part of]: Secondary | ICD-10-CM | POA: Diagnosis not present

## 2017-11-14 DIAGNOSIS — K5730 Diverticulosis of large intestine without perforation or abscess without bleeding: Secondary | ICD-10-CM | POA: Diagnosis not present

## 2017-11-14 DIAGNOSIS — I481 Persistent atrial fibrillation: Secondary | ICD-10-CM | POA: Insufficient documentation

## 2017-11-14 DIAGNOSIS — I2510 Atherosclerotic heart disease of native coronary artery without angina pectoris: Secondary | ICD-10-CM | POA: Diagnosis not present

## 2017-11-14 LAB — CMP (CANCER CENTER ONLY)
ALK PHOS: 86 U/L (ref 38–126)
ALT: 21 U/L (ref 0–44)
ANION GAP: 9 (ref 5–15)
AST: 23 U/L (ref 15–41)
Albumin: 3.8 g/dL (ref 3.5–5.0)
BILIRUBIN TOTAL: 0.9 mg/dL (ref 0.3–1.2)
BUN: 14 mg/dL (ref 8–23)
CALCIUM: 9.3 mg/dL (ref 8.9–10.3)
CO2: 26 mmol/L (ref 22–32)
CREATININE: 0.8 mg/dL (ref 0.61–1.24)
Chloride: 106 mmol/L (ref 98–111)
GFR, Estimated: >60 mL/min (ref >60)
Glucose, Bld: 111 mg/dL — ABNORMAL HIGH (ref 70–99)
Potassium: 4.5 mmol/L (ref 3.5–5.1)
SODIUM: 141 mmol/L (ref 135–145)
TOTAL PROTEIN: 6.4 g/dL — AB (ref 6.5–8.1)

## 2017-11-14 LAB — CBC WITH DIFFERENTIAL (CANCER CENTER ONLY)
BASOS ABS: 0 10*3/uL (ref 0.0–0.1)
BASOS PCT: 0 %
EOS ABS: 0.1 10*3/uL (ref 0.0–0.5)
Eosinophils Relative: 2 %
HEMATOCRIT: 41.6 % (ref 38.4–49.9)
HEMOGLOBIN: 14.7 g/dL (ref 13.0–17.1)
Lymphocytes Relative: 20 %
Lymphs Abs: 1.3 10*3/uL (ref 0.9–3.3)
MCH: 33.4 pg (ref 27.2–33.4)
MCHC: 35.3 g/dL (ref 32.0–36.0)
MCV: 94.5 fL (ref 79.3–98.0)
MONOS PCT: 8 %
Monocytes Absolute: 0.5 10*3/uL (ref 0.1–0.9)
NEUTROS ABS: 4.5 10*3/uL (ref 1.5–6.5)
NEUTROS PCT: 70 %
Platelet Count: 157 10*3/uL (ref 140–400)
RBC: 4.4 MIL/uL (ref 4.20–5.82)
RDW: 13.4 % (ref 11.0–14.6)
WBC Count: 6.4 10*3/uL (ref 4.0–10.3)

## 2017-11-14 LAB — TSH: TSH: <0.08 u[IU]/mL — ABNORMAL LOW (ref 0.320–4.118)

## 2017-11-14 MED ORDER — HEPARIN SOD (PORK) LOCK FLUSH 100 UNIT/ML IV SOLN
500.0000 [IU] | Freq: Once | INTRAVENOUS | Status: AC | PRN
  Administered 2017-11-14: 500 [IU]
  Filled 2017-11-14: qty 5

## 2017-11-14 MED ORDER — SODIUM CHLORIDE 0.9% FLUSH
10.0000 mL | INTRAVENOUS | Status: DC | PRN
  Administered 2017-11-14: 10 mL
  Filled 2017-11-14: qty 10

## 2017-11-14 MED ORDER — SODIUM CHLORIDE 0.9 % IV SOLN
200.0000 mg | Freq: Once | INTRAVENOUS | Status: AC
  Administered 2017-11-14: 200 mg via INTRAVENOUS
  Filled 2017-11-14: qty 8

## 2017-11-14 MED ORDER — SODIUM CHLORIDE 0.9 % IV SOLN
Freq: Once | INTRAVENOUS | Status: AC
  Administered 2017-11-14: 11:00:00 via INTRAVENOUS
  Filled 2017-11-14: qty 250

## 2017-11-14 NOTE — Telephone Encounter (Signed)
3 cycles already scheduled per 8/15 los.

## 2017-11-14 NOTE — Progress Notes (Signed)
[facility] [phone]   [phone]  OFFICE PROGRESS NOTE  [name], MD [address] [place] [address]  DIAGNOSIS: Metastatic non-small cell lung cancer initially diagnosed as stage IIIA (T2a, N2, M0) non-small cell lung cancer, poorly differentiated adenocarcinoma presented with left lower lobe lung mass in addition to mediastinal lymphadenopathy.  The patient was diagnosed with metastatic disease involving the left femur as well as left supraclavicular nodal metastases and right paratracheal lymphadenopathy in October 2018.  Biomarker Findings Microsatellite Status - MS-Stable Tumor Mutational Burden - TMB-Low (3 Muts/Mb) Genomic Findings For a complete list of the genes assayed, please refer to the Appendix. STK11 P226f*6 [id number] site 1993+7J>IDAXX E374* MLL2 [id number] NBN [id number] NOTCH2 [id number] splice site [id number]>PDisease relevant genes with no reportable alterations: EGFR, KRAS, ALK, BRAF, MET, RET, ERBB2, ROS1   PDL1 expression 5%  PRIOR THERAPY: 1) status post wedge resection of the left lower lobe lung mass as well as AP window lymph node dissection but there was residual metastatic mediastinal lymphadenopathy that could not be resected. 2) a course of concurrent chemoradiation [place] carboplatin [place]  3) status post palliative radiotherapy to the left femur metastatic bone disease. 4)  Systemic chemotherapy with carboplatin for AUC of 5, Alimta 500 mg/M2 and Keytruda 200 mg IV every 3 weeks.  First dose March 07, 2017.  Carboplatin was discontinued during cycle #2 secondary to hypersensitivity reaction. 5) status post 2 cycles of maintenance treatment with Alimta and Ketruda (pembrolizumab).  Alimta was discontinued secondary to intolerance.  CURRENT THERAPY: Maintenance treatment with single agent Ketruda (pembrolizumab) status post 7  cycles.  INTERVAL HISTORY: MicCLARK CUFF 4o. male returns to the clinic today for follow-up visit accompanied by his son.  The patient complaining of increasing fatigue recently.  He also has some dizzy spells with changing position.  He is currently on atenolol for irregular heart rate.  The patient denied having any chest pain, shortness of breath, cough or hemoptysis.  He denied having any fever or chills.  He has no nausea, vomiting, diarrhea or constipation.  He continues to tolerate his treatment with Keytruda fairly well.  He is here for evaluation before starting cycle #8.  MEDICAL HISTORY: Past Medical History:  Diagnosis Date  . Adenocarcinoma of left lung, stage 3 ([place]/16/[place]  . Arthritis   . Atrial fibrillation ([facility]/16/2018  . Atrial fibrillation ([place] COPD (chronic obstructive pulmonary disease) ([place]16/2018  . History of chemotherapy   . History of radiation therapy   . Hyperlipidemia   . Hypothyroid 08/15/2016  . Longstanding persistent atrial fibrillation ([facility]/30/2018  . Pathologic fracture    left femur  . Pneumonitis   . S/P TURP 08/15/2016  . Wears glasses   . Wears hearing aid in both ears     ALLERGIES:  is allergic to carboplatin and flecainide.  MEDICATIONS:  Current Outpatient Medications  Medication Sig Dispense Refill  . apixaban (ELIQUIS) 5 MG TABS tablet Take 5 mg by mouth 2 (two) times daily.    . aMarland Kitchenenolol (TENORMIN) 50 MG tablet TAKE 1 TABLET DAILY (DISCONTINUE METOPROLOL TART 50MG) 90 tablet 3  . atorvastatin (LIPITOR) 40 MG tablet Take 40 mg by mouth daily.    . baclofen (LIORESAL) 10 MG tablet Take 1 tablet (10 mg total) by mouth 3 (three) times daily. As needed for muscle spasm 50 tablet  0  . citalopram (CELEXA) 40 MG tablet Take 40 mg by mouth daily.    Marland Kitchen ibuprofen (ADVIL,MOTRIN) 400 MG tablet Take 400 mg by mouth every 4 (four) hours as needed for moderate pain.    Marland Kitchen levothyroxine (SYNTHROID, LEVOTHROID) 200 MCG tablet  Take 1 tablet (200 mcg total) by mouth daily before breakfast. 90 tablet 1  . levothyroxine (SYNTHROID, LEVOTHROID) 25 MCG tablet Take 1.5 tablets (37.5 mcg total) by mouth daily. (Patient taking differently: Take 25 mcg by mouth daily. ) 135 tablet 1  . LORazepam (ATIVAN) 0.5 MG tablet Take 1 tablet (0.5 mg total) by mouth every 8 (eight) hours as needed (nausea). 30 tablet 0  . sennosides-docusate sodium (SENOKOT-S) 8.6-50 MG tablet Take 2 tablets by mouth daily. 30 tablet 1  . tamsulosin (FLOMAX) 0.4 MG CAPS capsule Take 0.4 mg daily by mouth.     . zolpidem (AMBIEN) 5 MG tablet Take 5 mg by mouth at bedtime as needed for sleep.    Marland Kitchen ondansetron (ZOFRAN) 4 MG tablet Take 1 tablet (4 mg total) by mouth every 8 (eight) hours as needed for nausea or vomiting. (Patient not taking: Reported on 10/24/2017) 30 tablet 0  . oxyCODONE (ROXICODONE) 5 MG immediate release tablet Take 1-2 tablets (5-10 mg total) by mouth every 4 (four) hours as needed for severe pain. (Patient not taking: Reported on 10/24/2017) 50 tablet 0  . prochlorperazine (COMPAZINE) 10 MG tablet Take 1 tablet (10 mg total) by mouth every 6 (six) hours as needed for nausea or vomiting. (Patient not taking: Reported on 10/24/2017) 30 tablet 0   No current facility-administered medications for this visit.     SURGICAL HISTORY:  Past Surgical History:  Procedure Laterality Date  . BRONCHOSCOPY  10/2014  . CARDIAC CATHETERIZATION     05/07/12  . CARDIOVERSION     x2  . COLONOSCOPY    . DG BIOPSY LUNG Left 10/2014   FNA - Adenocarcinoma   . FEMUR IM NAIL Left 02/19/2017  . FEMUR IM NAIL Left 02/19/2017   Procedure: INTRAMEDULLARY (IM) NAIL FEMORAL;  Surgeon: Marchia Bond, MD;  Location: [facility];  Service: Orthopedics;  Laterality: Left;  . IR FLUORO GUIDE PORT INSERTION RIGHT  07/03/2017  . IR US GUIDE VASC ACCESS RIGHT  07/03/2017  . LUNG CANCER SURGERY Left 12/2014   Wedge Resection   . MULTIPLE TOOTH EXTRACTIONS    . Status post TURP     . TONSILLECTOMY      REVIEW OF SYSTEMS:  A comprehensive review of systems was negative except for: Constitutional: positive for fatigue Musculoskeletal: positive for muscle weakness   PHYSICAL EXAMINATION: General appearance: alert, cooperative, fatigued and no distress Head: Normocephalic, without obvious abnormality, atraumatic Neck: no adenopathy, no JVD, supple, symmetrical, trachea midline and thyroid not enlarged, symmetric, no tenderness/mass/nodules Lymph nodes: Cervical, supraclavicular, and axillary nodes normal. Resp: clear to auscultation bilaterally Back: symmetric, no curvature. ROM normal. No CVA tenderness. Cardio: regular rate and rhythm, S1, S2 normal, no murmur, click, rub or gallop GI: soft, non-tender; bowel sounds normal; no masses,  no organomegaly Extremities: extremities normal, atraumatic, no cyanosis or edema  ECOG PERFORMANCE STATUS: 1 - Symptomatic but completely ambulatory  Blood pressure 110/83, pulse 90, temperature (!) 97.5 F (36.4 C), temperature source Oral, resp. rate 18, height 6' 1.5" (1.867 m), weight 236 lb 14.4 oz (107.5 kg), SpO2 97 %.  LABORATORY DATA: Lab Results  Component Value Date   WBC 6.4 11/14/2017   HGB 14.7  11/14/2017   HCT 41.6 11/14/2017   MCV 94.5 11/14/2017   PLT 157 11/14/2017      Chemistry      Component Value Date/Time   NA 139 10/24/2017 0837   NA 136 04/04/2017 1141   K 4.3 10/24/2017 0837   K 4.4 04/04/2017 1141   CL 105 10/24/2017 0837   CO2 26 10/24/2017 0837   CO2 24 04/04/2017 1141   BUN 16 10/24/2017 0837   BUN 21.1 04/04/2017 1141   CREATININE 0.88 10/24/2017 0837   CREATININE 1.1 04/04/2017 1141      Component Value Date/Time   CALCIUM 9.3 10/24/2017 0837   CALCIUM 9.1 04/04/2017 1141   ALKPHOS 93 10/24/2017 0837   ALKPHOS 89 04/04/2017 1141   AST 19 10/24/2017 0837   AST 17 04/04/2017 1141   ALT 19 10/24/2017 0837   ALT 18 04/04/2017 1141   BILITOT 0.8 10/24/2017 0837   BILITOT 0.67  04/04/2017 1141       RADIOGRAPHIC STUDIES: No results found.  ASSESSMENT AND PLAN: This is a 67 years old white male with metastatic non-small cell lung cancer, adenocarcinoma with no actionable mutations and PDL 1 expression of 5% that was initially diagnosed as stage IIIa non-small cell lung cancer, adenocarcinoma status post left lower lobectomy with lymph node dissection followed by a course of concurrent chemoradiation completed in January 2016.  The patient had evidence for disease metastasis in October 2018 with metastatic disease to the left femur as well as left supraclavicular and right paratracheal lymph nodes. The patient is currently on systemic chemotherapy initially was with carboplatin, Alimta and Keytruda.  Carboplatin was discontinued secondary to hypersensitivity reaction starting from cycle #2.   He was also treated with 3 cycles of maintenance Alimta and Ketruda (pembrolizumab) but Alimta was discontinued secondary to intolerance. He is currently undergoing treatment with maintenance Keytruda as a single agent status post 7 cycles.  He continues to tolerate the treatment well except for recent fatigue. I recommended for the patient to proceed with cycle #8 today. I will see him back for follow-up visit in 3 weeks for evaluation after repeating CT scan of the chest, abdomen and pelvis for restaging of his disease. The patient was advised to call immediately if he has any concerning symptoms in the interval. The patient voices understanding of current disease status and treatment options and is in agreement with the current care plan. All questions were answered. The patient knows to call the clinic with any problems, questions or concerns. We can certainly see the patient much sooner if necessary.  Disclaimer: This note was dictated with voice recognition software. Similar sounding words can inadvertently be transcribed and may not be corrected upon review.

## 2017-11-14 NOTE — Telephone Encounter (Signed)
Gave patient avs and calendar of upcoming appts.  °

## 2017-11-14 NOTE — Patient Instructions (Signed)
[facility] Discharge Instructions for Patients Receiving Chemotherapy  Today you received the following chemotherapy agents :  Keytruda.  To help prevent nausea and vomiting after your treatment, we encourage you to take your nausea medication as prescribed.   If you develop nausea and vomiting that is not controlled by your nausea medication, call the clinic.   BELOW ARE SYMPTOMS THAT SHOULD BE REPORTED IMMEDIATELY:  *FEVER GREATER THAN 100.5 F  *CHILLS WITH OR WITHOUT FEVER  NAUSEA AND VOMITING THAT IS NOT CONTROLLED WITH YOUR NAUSEA MEDICATION  *UNUSUAL SHORTNESS OF BREATH  *UNUSUAL BRUISING OR BLEEDING  TENDERNESS IN MOUTH AND THROAT WITH OR WITHOUT PRESENCE OF ULCERS  *URINARY PROBLEMS  *BOWEL PROBLEMS  UNUSUAL RASH Items with * indicate a potential emergency and should be followed up as soon as possible.  Feel free to call the clinic should you have any questions or concerns. The clinic phone number is [phone].  Please show the CHEMO ALERT CARD at check-in to the Emergency Department and triage nurse.  

## 2017-12-04 ENCOUNTER — Ambulatory Visit (HOSPITAL_COMMUNITY)
Admission: RE | Admit: 2017-12-04 | Discharge: 2017-12-04 | Disposition: A | Discharge status: Home / Self Care | Payer: Medicare HMO | Source: Ambulatory Visit | Attending: Internal Medicine | Admitting: Internal Medicine

## 2017-12-04 DIAGNOSIS — I2510 Atherosclerotic heart disease of native coronary artery without angina pectoris: Secondary | ICD-10-CM | POA: Insufficient documentation

## 2017-12-04 DIAGNOSIS — C3490 Malignant neoplasm of unspecified part of unspecified bronchus or lung: Secondary | ICD-10-CM

## 2017-12-04 DIAGNOSIS — K8020 Calculus of gallbladder without cholecystitis without obstruction: Secondary | ICD-10-CM | POA: Insufficient documentation

## 2017-12-04 DIAGNOSIS — R160 Hepatomegaly, not elsewhere classified: Secondary | ICD-10-CM | POA: Insufficient documentation

## 2017-12-04 DIAGNOSIS — I700 Atherosclerosis of aorta: Secondary | ICD-10-CM | POA: Insufficient documentation

## 2017-12-04 DIAGNOSIS — J439 Emphysema, unspecified: Secondary | ICD-10-CM | POA: Insufficient documentation

## 2017-12-04 DIAGNOSIS — R918 Other nonspecific abnormal finding of lung field: Secondary | ICD-10-CM | POA: Insufficient documentation

## 2017-12-04 MED ORDER — IOHEXOL 300 MG/ML  SOLN
100.0000 mL | Freq: Once | INTRAMUSCULAR | Status: AC | PRN
  Administered 2017-12-04: 100 mL via INTRAVENOUS

## 2017-12-05 ENCOUNTER — Inpatient Hospital Stay: Payer: Medicare HMO

## 2017-12-05 ENCOUNTER — Encounter: Payer: Self-pay | Admitting: Internal Medicine

## 2017-12-05 ENCOUNTER — Inpatient Hospital Stay: Payer: Medicare HMO | Attending: Internal Medicine

## 2017-12-05 ENCOUNTER — Inpatient Hospital Stay (HOSPITAL_BASED_OUTPATIENT_CLINIC_OR_DEPARTMENT_OTHER): Payer: Medicare HMO | Admitting: Internal Medicine

## 2017-12-05 ENCOUNTER — Telehealth: Payer: Self-pay | Admitting: Internal Medicine

## 2017-12-05 VITALS — BP 103/59 | HR 76 | Temp 97.6°F | Resp 17 | Ht 73.5 in | Wt 237.7 lb

## 2017-12-05 DIAGNOSIS — Z79899 Other long term (current) drug therapy: Secondary | ICD-10-CM

## 2017-12-05 DIAGNOSIS — M1990 Unspecified osteoarthritis, unspecified site: Secondary | ICD-10-CM | POA: Diagnosis not present

## 2017-12-05 DIAGNOSIS — Z9221 Personal history of antineoplastic chemotherapy: Secondary | ICD-10-CM | POA: Insufficient documentation

## 2017-12-05 DIAGNOSIS — C7951 Secondary malignant neoplasm of bone: Secondary | ICD-10-CM

## 2017-12-05 DIAGNOSIS — J441 Chronic obstructive pulmonary disease with (acute) exacerbation: Secondary | ICD-10-CM

## 2017-12-05 DIAGNOSIS — C3432 Malignant neoplasm of lower lobe, left bronchus or lung: Secondary | ICD-10-CM | POA: Diagnosis not present

## 2017-12-05 DIAGNOSIS — E039 Hypothyroidism, unspecified: Secondary | ICD-10-CM

## 2017-12-05 DIAGNOSIS — Z902 Acquired absence of lung [part of]: Secondary | ICD-10-CM

## 2017-12-05 DIAGNOSIS — Z7901 Long term (current) use of anticoagulants: Secondary | ICD-10-CM | POA: Insufficient documentation

## 2017-12-05 DIAGNOSIS — K8020 Calculus of gallbladder without cholecystitis without obstruction: Secondary | ICD-10-CM | POA: Insufficient documentation

## 2017-12-05 DIAGNOSIS — I481 Persistent atrial fibrillation: Secondary | ICD-10-CM | POA: Diagnosis not present

## 2017-12-05 DIAGNOSIS — Z95828 Presence of other vascular implants and grafts: Secondary | ICD-10-CM

## 2017-12-05 DIAGNOSIS — I700 Atherosclerosis of aorta: Secondary | ICD-10-CM | POA: Insufficient documentation

## 2017-12-05 DIAGNOSIS — Z5112 Encounter for antineoplastic immunotherapy: Secondary | ICD-10-CM | POA: Insufficient documentation

## 2017-12-05 DIAGNOSIS — C3492 Malignant neoplasm of unspecified part of left bronchus or lung: Secondary | ICD-10-CM

## 2017-12-05 DIAGNOSIS — E785 Hyperlipidemia, unspecified: Secondary | ICD-10-CM | POA: Insufficient documentation

## 2017-12-05 DIAGNOSIS — J449 Chronic obstructive pulmonary disease, unspecified: Secondary | ICD-10-CM | POA: Insufficient documentation

## 2017-12-05 LAB — CBC WITH DIFFERENTIAL (CANCER CENTER ONLY)
BASOS PCT: 0 %
Basophils Absolute: 0 10*3/uL (ref 0.0–0.1)
Eosinophils Absolute: 0.1 10*3/uL (ref 0.0–0.5)
Eosinophils Relative: 2 %
HCT: 39.6 % (ref 38.4–49.9)
HEMOGLOBIN: 14.1 g/dL (ref 13.0–17.1)
LYMPHS ABS: 1.2 10*3/uL (ref 0.9–3.3)
Lymphocytes Relative: 19 %
MCH: 33.2 pg (ref 27.2–33.4)
MCHC: 35.6 g/dL (ref 32.0–36.0)
MCV: 93.2 fL (ref 79.3–98.0)
Monocytes Absolute: 0.4 10*3/uL (ref 0.1–0.9)
Monocytes Relative: 7 %
NEUTROS ABS: 4.6 10*3/uL (ref 1.5–6.5)
NEUTROS PCT: 72 %
Platelet Count: 163 10*3/uL (ref 140–400)
RBC: 4.25 MIL/uL (ref 4.20–5.82)
RDW: 13.6 % (ref 11.0–14.6)
WBC: 6.3 10*3/uL (ref 4.0–10.3)

## 2017-12-05 LAB — CMP (CANCER CENTER ONLY)
ALBUMIN: 3.8 g/dL (ref 3.5–5.0)
ALK PHOS: 94 U/L (ref 38–126)
ALT: 19 U/L (ref 0–44)
AST: 23 U/L (ref 15–41)
Anion gap: 7 (ref 5–15)
BILIRUBIN TOTAL: 0.9 mg/dL (ref 0.3–1.2)
BUN: 18 mg/dL (ref 8–23)
CO2: 26 mmol/L (ref 22–32)
Calcium: 9.4 mg/dL (ref 8.9–10.3)
Chloride: 107 mmol/L (ref 98–111)
Creatinine: 0.91 mg/dL (ref 0.61–1.24)
GFR, Est AFR Am: >60 mL/min (ref >60)
GFR, Estimated: >60 mL/min (ref >60)
Glucose, Bld: 107 mg/dL — ABNORMAL HIGH (ref 70–99)
POTASSIUM: 4.8 mmol/L (ref 3.5–5.1)
SODIUM: 140 mmol/L (ref 135–145)
Total Protein: 6.2 g/dL — ABNORMAL LOW (ref 6.5–8.1)

## 2017-12-05 LAB — TSH: TSH: <0.08 u[IU]/mL — ABNORMAL LOW (ref 0.320–4.118)

## 2017-12-05 MED ORDER — SODIUM CHLORIDE 0.9% FLUSH
10.0000 mL | INTRAVENOUS | Status: DC | PRN
  Administered 2017-12-05: 10 mL
  Filled 2017-12-05: qty 10

## 2017-12-05 MED ORDER — HEPARIN SOD (PORK) LOCK FLUSH 100 UNIT/ML IV SOLN
500.0000 [IU] | Freq: Once | INTRAVENOUS | Status: AC | PRN
  Administered 2017-12-05: 500 [IU]
  Filled 2017-12-05: qty 5

## 2017-12-05 MED ORDER — SODIUM CHLORIDE 0.9 % IV SOLN
Freq: Once | INTRAVENOUS | Status: AC
  Administered 2017-12-05: 11:00:00 via INTRAVENOUS
  Filled 2017-12-05: qty 250

## 2017-12-05 MED ORDER — SODIUM CHLORIDE 0.9 % IV SOLN
200.0000 mg | Freq: Once | INTRAVENOUS | Status: AC
  Administered 2017-12-05: 200 mg via INTRAVENOUS
  Filled 2017-12-05: qty 8

## 2017-12-05 MED ORDER — LEVOFLOXACIN 500 MG PO TABS: 500.0000 mg | ORAL_TABLET | Freq: Every day | ORAL | 0 refills | Status: DC

## 2017-12-05 NOTE — Telephone Encounter (Signed)
Scheduled apt per 9/5 los - pt to get an updated schedule next visit.

## 2017-12-05 NOTE — Patient Instructions (Signed)
[facility] Discharge Instructions for Patients Receiving Chemotherapy  Today you received the following chemotherapy agents :  Keytruda.  To help prevent nausea and vomiting after your treatment, we encourage you to take your nausea medication as prescribed.   If you develop nausea and vomiting that is not controlled by your nausea medication, call the clinic.   BELOW ARE SYMPTOMS THAT SHOULD BE REPORTED IMMEDIATELY:  *FEVER GREATER THAN 100.5 F  *CHILLS WITH OR WITHOUT FEVER  NAUSEA AND VOMITING THAT IS NOT CONTROLLED WITH YOUR NAUSEA MEDICATION  *UNUSUAL SHORTNESS OF BREATH  *UNUSUAL BRUISING OR BLEEDING  TENDERNESS IN MOUTH AND THROAT WITH OR WITHOUT PRESENCE OF ULCERS  *URINARY PROBLEMS  *BOWEL PROBLEMS  UNUSUAL RASH Items with * indicate a potential emergency and should be followed up as soon as possible.  Feel free to call the clinic should you have any questions or concerns. The clinic phone number is [phone].  Please show the CHEMO ALERT CARD at check-in to the Emergency Department and triage nurse.  

## 2017-12-05 NOTE — Progress Notes (Signed)
Victor Huber [phone]   [phone]  OFFICE PROGRESS NOTE  [name], MD [address], [place] [address]  DIAGNOSIS: Metastatic non-small cell lung cancer initially diagnosed as stage IIIA (T2a, N2, M0) non-small cell lung cancer, poorly differentiated adenocarcinoma presented with left lower lobe lung mass in addition to mediastinal lymphadenopathy.  The patient was diagnosed with metastatic disease involving the left femur as well as left supraclavicular nodal metastases and right paratracheal lymphadenopathy in October 2018.  Biomarker Findings Microsatellite Status - MS-Stable Tumor Mutational Burden - TMB-Low (3 Muts/Mb) Genomic Findings For a complete list of the genes assayed, please refer to the Appendix. STK11 P246f*6 [id number] site [id number]>WDAXX E374* MLL2 [id number] NBN [id number] NOTCH2 [id number] splice site [id number]+7O>UDisease relevant genes with no reportable alterations: EGFR, KRAS, ALK, BRAF, MET, RET, ERBB2, ROS1   PDL1 expression 5%  PRIOR THERAPY: 1) status post wedge resection of the left lower lobe lung mass as well as AP window lymph node dissection but there was residual metastatic mediastinal lymphadenopathy that could not be resected. 2) a course of concurrent chemoradiation [place] carboplatin [place]  3) status post palliative radiotherapy to the left femur metastatic bone disease. 4)  Systemic chemotherapy with carboplatin for AUC of 5, Alimta 500 mg/M2 and Keytruda 200 mg IV every 3 weeks.  First dose March 07, 2017.  Carboplatin was discontinued during cycle #2 secondary to hypersensitivity reaction. 5) status post 2 cycles of maintenance treatment with Alimta and Ketruda (pembrolizumab).  Alimta was discontinued secondary to intolerance.  CURRENT THERAPY: Maintenance treatment with single agent Ketruda (pembrolizumab) status post 8  cycles.  INTERVAL HISTORY: Victor Huber 68o. male returns to the clinic today for follow-up visit accompanied by his son.  The patient has no complaints today except for mild cough.  He denied having any chest pain, shortness of breath or hemoptysis.  He denied having any recent weight loss or night sweats.  He has no nausea, vomiting, diarrhea or constipation.  He denied having any significant headache or visual changes.  He has been tolerating his treatment with Keytruda fairly well.  He admits to vaping at regular basis.  The patient had repeat CT scan of the chest, abdomen and pelvis performed recently and he is here for evaluation and discussion of his discuss results.  MEDICAL HISTORY: Past Medical History:  Diagnosis Date  . Adenocarcinoma of left lung, stage 3 ([place]/16/2018  . Arthritis   . Atrial [place] ([place]/16/2018  . Atrial fibrillation ([place] COPD (chronic obstructive pulmonary disease) ([place]/16/2018  . History of chemotherapy   . History of radiation therapy   . Hyperlipidemia   . Hypothyroid 08/15/2016  . Longstanding persistent atrial fibrillation ([place]/30/2018  . Pathologic fracture    left femur  . Pneumonitis   . S/P TURP 08/15/2016  . Wears glasses   . Wears hearing aid in both ears     ALLERGIES:  is allergic to carboplatin and flecainide.  MEDICATIONS:  Current Outpatient Medications  Medication Sig Dispense Refill  . apixaban (ELIQUIS) 5 MG TABS tablet Take 5 mg by mouth 2 (two) times daily.    . aMarland Kitchenenolol (TENORMIN) 50 MG tablet TAKE 1 TABLET DAILY (DISCONTINUE METOPROLOL TART 50MG) 90 tablet 3  . atorvastatin (LIPITOR) 40 MG tablet Take 40 mg by mouth daily.    . baclofen (LIORESAL) 10 MG tablet  Take 1 tablet (10 mg total) by mouth 3 (three) times daily. As needed for muscle spasm 50 tablet 0  . citalopram (CELEXA) 40 MG tablet Take 40 mg by mouth daily.    Marland Kitchen ibuprofen (ADVIL,MOTRIN) 400 MG tablet Take 400 mg by mouth every 4 (four)  hours as needed for moderate pain.    Marland Kitchen levothyroxine (SYNTHROID, LEVOTHROID) 200 MCG tablet Take 1 tablet (200 mcg total) by mouth daily before breakfast. 90 tablet 1  . levothyroxine (SYNTHROID, LEVOTHROID) 25 MCG tablet Take 1.5 tablets (37.5 mcg total) by mouth daily. (Patient taking differently: Take 25 mcg by mouth daily. ) 135 tablet 1  . LORazepam (ATIVAN) 0.5 MG tablet Take 1 tablet (0.5 mg total) by mouth every 8 (eight) hours as needed (nausea). 30 tablet 0  . ondansetron (ZOFRAN) 4 MG tablet Take 1 tablet (4 mg total) by mouth every 8 (eight) hours as needed for nausea or vomiting. 30 tablet 0  . oxyCODONE (ROXICODONE) 5 MG immediate release tablet Take 1-2 tablets (5-10 mg total) by mouth every 4 (four) hours as needed for severe pain. 50 tablet 0  . prochlorperazine (COMPAZINE) 10 MG tablet Take 1 tablet (10 mg total) by mouth every 6 (six) hours as needed for nausea or vomiting. 30 tablet 0  . sennosides-docusate sodium (SENOKOT-S) 8.6-50 MG tablet Take 2 tablets by mouth daily. 30 tablet 1  . tamsulosin (FLOMAX) 0.4 MG CAPS capsule Take 0.4 mg daily by mouth.     . zolpidem (AMBIEN) 5 MG tablet Take 5 mg by mouth at bedtime as needed for sleep.     No current facility-administered medications for this visit.     SURGICAL HISTORY:  Past Surgical History:  Procedure Laterality Date  . BRONCHOSCOPY  10/2014  . CARDIAC CATHETERIZATION     05/07/12  . CARDIOVERSION     x2  . COLONOSCOPY    . DG BIOPSY LUNG Left 10/2014   FNA - Adenocarcinoma   . FEMUR IM NAIL Left 02/19/2017  . FEMUR IM NAIL Left 02/19/2017   Procedure: INTRAMEDULLARY (IM) NAIL FEMORAL;  Surgeon: Marchia Bond, MD;  Location: [facility];  Service: Orthopedics;  Laterality: Left;  . IR FLUORO GUIDE PORT INSERTION RIGHT  07/03/2017  . IR US GUIDE VASC ACCESS RIGHT  07/03/2017  . LUNG CANCER SURGERY Left 12/2014   Wedge Resection   . MULTIPLE TOOTH EXTRACTIONS    . Status post TURP    . TONSILLECTOMY      REVIEW OF  SYSTEMS:  Constitutional: negative Eyes: negative Ears, nose, mouth, throat, and face: negative Respiratory: positive for cough Cardiovascular: negative Gastrointestinal: negative Genitourinary:negative Integument/breast: negative Hematologic/lymphatic: negative Musculoskeletal:negative Neurological: negative Behavioral/Psych: negative Endocrine: negative Allergic/Immunologic: negative   PHYSICAL EXAMINATION: General appearance: alert, cooperative and no distress Head: Normocephalic, without obvious abnormality, atraumatic Neck: no adenopathy, no JVD, supple, symmetrical, trachea midline and thyroid not enlarged, symmetric, no tenderness/mass/nodules Lymph nodes: Cervical, supraclavicular, and axillary nodes normal. Resp: clear to auscultation bilaterally Back: symmetric, no curvature. ROM normal. No CVA tenderness. Cardio: regular rate and rhythm, S1, S2 normal, no murmur, click, rub or gallop GI: soft, non-tender; bowel sounds normal; no masses,  no organomegaly Extremities: extremities normal, atraumatic, no cyanosis or edema Neurologic: Alert and oriented X 3, normal strength and tone. Normal symmetric reflexes. Normal coordination and gait  ECOG PERFORMANCE STATUS: 1 - Symptomatic but completely ambulatory  Blood pressure (!) 103/59, pulse 76, temperature 97.6 F (36.4 C), temperature source Oral, resp. rate 17, height  6' 1.5" (1.867 m), weight 237 lb 11.2 oz (107.8 kg), SpO2 97 %.  LABORATORY DATA: Lab Results  Component Value Date   WBC 6.4 11/14/2017   HGB 14.7 11/14/2017   HCT 41.6 11/14/2017   MCV 94.5 11/14/2017   PLT 157 11/14/2017      Chemistry      Component Value Date/Time   NA 141 11/14/2017 0829   NA 136 04/04/2017 1141   K 4.5 11/14/2017 0829   K 4.4 04/04/2017 1141   CL 106 11/14/2017 0829   CO2 26 11/14/2017 0829   CO2 24 04/04/2017 1141   BUN 14 11/14/2017 0829   BUN 21.1 04/04/2017 1141   CREATININE 0.80 11/14/2017 0829   CREATININE 1.1  04/04/2017 1141      Component Value Date/Time   CALCIUM 9.3 11/14/2017 0829   CALCIUM 9.1 04/04/2017 1141   ALKPHOS 86 11/14/2017 0829   ALKPHOS 89 04/04/2017 1141   AST 23 11/14/2017 0829   AST 17 04/04/2017 1141   ALT 21 11/14/2017 0829   ALT 18 04/04/2017 1141   BILITOT 0.9 11/14/2017 0829   BILITOT 0.67 04/04/2017 1141       RADIOGRAPHIC STUDIES: Ct Chest W Contrast  Result Date: 12/04/2017 CLINICAL DATA:  Lung cancer, ongoing immunotherapy. EXAM: CT CHEST, ABDOMEN, AND PELVIS WITH CONTRAST TECHNIQUE: Multidetector CT imaging of the chest, abdomen and pelvis was performed following the standard protocol during bolus administration of intravenous contrast. CONTRAST:  160m OMNIPAQUE IOHEXOL 300 MG/ML  SOLN COMPARISON:  10/01/2017. FINDINGS: CT CHEST FINDINGS Cardiovascular: Right IJ Port-A-Cath terminates in the right atrium. Atherosclerotic calcification of the arterial vasculature. Right and left pulmonary arteries are enlarged. Coronary artery calcification. Heart is enlarged. Small amount pericardial fluid is unchanged. Mediastinum/Nodes: Mediastinal lymph nodes measure up to 11 mm in low right paratracheal station, as before. No hilar or axillary adenopathy. Esophagus is grossly unremarkable. Lungs/Pleura: Bullous emphysema. Multiple new and progressive areas of irregular peribronchovascular consolidation in the lungs bilaterally. Post treatment bronchiectasis, parenchymal retraction, architectural distortion and volume loss in the perihilar left hemithorax. Trace left pleural fluid. Narrowing of the left lower lobe bronchus. Airway is otherwise unremarkable. Musculoskeletal: No worrisome lytic or sclerotic lesions. Left thoracotomy changes. CT ABDOMEN PELVIS FINDINGS Hepatobiliary: Subcentimeter low-attenuation lesion in the left hepatic lobe is too small to characterize. Numerous stones are seen in the gallbladder. No biliary ductal dilatation. Pancreas: Negative. Spleen: Negative.  Adrenals/Urinary Tract: Adrenal glands are unremarkable. Low-attenuation lesions in the kidneys measure up to 2.5 cm on the left and are likely cysts. Ureters are decompressed. Bladder is grossly unremarkable. Stomach/Bowel: Stomach, small bowel, appendix and colon are unremarkable. Vascular/Lymphatic: Atherosclerotic calcification of the arterial vasculature without abdominal aortic aneurysm. No pathologically enlarged lymph nodes. Reproductive: Prostate is visualized. Other: No free fluid.  Mesenteries and peritoneum are unremarkable. Musculoskeletal: Postoperative changes in the proximal left femur. No worrisome lytic or sclerotic lesions. Degenerative changes in the spine. IMPRESSION: 1. New and enlarging areas of irregular peribronchovascular consolidation in the lungs bilaterally may be infectious or inflammatory in etiology. Organizing pneumonia is another consideration. Difficult to definitively exclude metastatic disease. 2. Aortic atherosclerosis (ICD10-170.0). Coronary artery calcification. 3. Enlarged pulmonary arteries, indicative of pulmonary arterial hypertension. 4.  Emphysema (ICD10-J43.9). 5. Mild hepatomegaly. 6. Cholelithiasis. Electronically Signed   By: MLorin PicketM.D.   On: 12/04/2017 13:41   Ct Abdomen Pelvis W Contrast  Result Date: 12/04/2017 CLINICAL DATA:  Lung cancer, ongoing immunotherapy. EXAM: CT CHEST, ABDOMEN, AND PELVIS WITH CONTRAST  TECHNIQUE: Multidetector CT imaging of the chest, abdomen and pelvis was performed following the standard protocol during bolus administration of intravenous contrast. CONTRAST:  147m OMNIPAQUE IOHEXOL 300 MG/ML  SOLN COMPARISON:  10/01/2017. FINDINGS: CT CHEST FINDINGS Cardiovascular: Right IJ Port-A-Cath terminates in the right atrium. Atherosclerotic calcification of the arterial vasculature. Right and left pulmonary arteries are enlarged. Coronary artery calcification. Heart is enlarged. Small amount pericardial fluid is unchanged.  Mediastinum/Nodes: Mediastinal lymph nodes measure up to 11 mm in low right paratracheal station, as before. No hilar or axillary adenopathy. Esophagus is grossly unremarkable. Lungs/Pleura: Bullous emphysema. Multiple new and progressive areas of irregular peribronchovascular consolidation in the lungs bilaterally. Post treatment bronchiectasis, parenchymal retraction, architectural distortion and volume loss in the perihilar left hemithorax. Trace left pleural fluid. Narrowing of the left lower lobe bronchus. Airway is otherwise unremarkable. Musculoskeletal: No worrisome lytic or sclerotic lesions. Left thoracotomy changes. CT ABDOMEN PELVIS FINDINGS Hepatobiliary: Subcentimeter low-attenuation lesion in the left hepatic lobe is too small to characterize. Numerous stones are seen in the gallbladder. No biliary ductal dilatation. Pancreas: Negative. Spleen: Negative. Adrenals/Urinary Tract: Adrenal glands are unremarkable. Low-attenuation lesions in the kidneys measure up to 2.5 cm on the left and are likely cysts. Ureters are decompressed. Bladder is grossly unremarkable. Stomach/Bowel: Stomach, small bowel, appendix and colon are unremarkable. Vascular/Lymphatic: Atherosclerotic calcification of the arterial vasculature without abdominal aortic aneurysm. No pathologically enlarged lymph nodes. Reproductive: Prostate is visualized. Other: No free fluid.  Mesenteries and peritoneum are unremarkable. Musculoskeletal: Postoperative changes in the proximal left femur. No worrisome lytic or sclerotic lesions. Degenerative changes in the spine. IMPRESSION: 1. New and enlarging areas of irregular peribronchovascular consolidation in the lungs bilaterally may be infectious or inflammatory in etiology. Organizing pneumonia is another consideration. Difficult to definitively exclude metastatic disease. 2. Aortic atherosclerosis (ICD10-170.0). Coronary artery calcification. 3. Enlarged pulmonary arteries, indicative of  pulmonary arterial hypertension. 4.  Emphysema (ICD10-J43.9). 5. Mild hepatomegaly. 6. Cholelithiasis. Electronically Signed   By: MLorin PicketM.D.   On: 12/04/2017 13:41    ASSESSMENT AND PLAN: This is a 67years old white male with metastatic non-small cell lung cancer, adenocarcinoma with no actionable mutations and PDL 1 expression of 5% that was initially diagnosed as stage IIIa non-small cell lung cancer, adenocarcinoma status post left lower lobectomy with lymph node dissection followed by a course of concurrent chemoradiation completed in January 2016.  The patient had evidence for disease metastasis in October 2018 with metastatic disease to the left femur as well as left supraclavicular and right paratracheal lymph nodes. The patient is currently on systemic chemotherapy initially was with carboplatin, Alimta and Keytruda.  Carboplatin was discontinued secondary to hypersensitivity reaction starting from cycle #2.   He was also treated with 3 cycles of maintenance Alimta and Ketruda (pembrolizumab) but Alimta was discontinued secondary to intolerance. He is currently undergoing treatment with maintenance Keytruda as a single agent status post 8 cycles.  The patient continues to tolerate his treatment well with no concerning adverse effects. He had repeat CT scan of the chest, abdomen and pelvis performed recently.  I personally and independently reviewed the scan images and discussed the results with the patient and his son.  His a scan showed no concerning findings for disease progression but there was suspicious inflammatory changes bilaterally.  This could be related to his recent vaping versus immunotherapy mediated inflammatory process versus early infectious process. I started the patient empirically on Levaquin 500 mg p.o. daily for 7 days. He was  advised to call immediately if he has any worsening dyspnea or cough. I recommended for the patient to proceed with cycle #9 today as  scheduled. I will see him back for follow-up visit in 3 weeks for evaluation before the next cycle of his treatment. For the hypothyroidism, he is followed by his primary care physician and he will have to adjust his dose of levothyroxine. The patient was advised to call if he has any other concerning symptoms in the interval. The patient voices understanding of current disease status and treatment options and is in agreement with the current care plan. All questions were answered. The patient knows to call the clinic with any problems, questions or concerns. We can certainly see the patient much sooner if necessary.  Disclaimer: This note was dictated with voice recognition software. Similar sounding words can inadvertently be transcribed and may not be corrected upon review.

## 2017-12-16 ENCOUNTER — Telehealth: Payer: Self-pay | Admitting: Internal Medicine

## 2017-12-16 NOTE — Telephone Encounter (Signed)
MM PAL 9/26 - f/u moved to [facility]. Per [facility] patient moved to 9/24 due to scan result/tx decision. Spoke with patient and per patient request appointments moved back to 9/26 due to the reason listed below, which was forwarded to [facility]/MM.   Dr. Luna [place],   Mr. Keys has transportation issues and cannot decouple his appointments or come on any day other Thursday.   He is requesting that he see [facility] in MM's absence and that his appointments not moved as rearraning his transportation is nearly impossible.    Thank you,  Lenna Sciara

## 2017-12-17 NOTE — Telephone Encounter (Signed)
Per response from MM ok for patient to remain as scheduled for 9/26 w/[facility]. Spoke with patient again this morning - confirmed appointments.

## 2017-12-24 ENCOUNTER — Ambulatory Visit: Payer: Medicare HMO | Admitting: Oncology

## 2017-12-24 ENCOUNTER — Other Ambulatory Visit: Payer: Medicare HMO

## 2017-12-26 ENCOUNTER — Inpatient Hospital Stay: Payer: Medicare HMO

## 2017-12-26 ENCOUNTER — Other Ambulatory Visit: Payer: Medicare HMO

## 2017-12-26 ENCOUNTER — Inpatient Hospital Stay (HOSPITAL_BASED_OUTPATIENT_CLINIC_OR_DEPARTMENT_OTHER): Payer: Medicare HMO | Admitting: Oncology

## 2017-12-26 ENCOUNTER — Other Ambulatory Visit: Payer: Self-pay

## 2017-12-26 ENCOUNTER — Encounter: Payer: Self-pay | Admitting: Oncology

## 2017-12-26 ENCOUNTER — Telehealth: Payer: Self-pay | Admitting: Oncology

## 2017-12-26 ENCOUNTER — Ambulatory Visit: Payer: Medicare HMO | Admitting: Oncology

## 2017-12-26 VITALS — BP 127/71 | HR 93 | Temp 98.7°F | Resp 17 | Ht 73.5 in | Wt 235.6 lb

## 2017-12-26 DIAGNOSIS — E039 Hypothyroidism, unspecified: Secondary | ICD-10-CM

## 2017-12-26 DIAGNOSIS — Z5112 Encounter for antineoplastic immunotherapy: Secondary | ICD-10-CM

## 2017-12-26 DIAGNOSIS — C3432 Malignant neoplasm of lower lobe, left bronchus or lung: Secondary | ICD-10-CM | POA: Diagnosis not present

## 2017-12-26 DIAGNOSIS — Z95828 Presence of other vascular implants and grafts: Secondary | ICD-10-CM

## 2017-12-26 DIAGNOSIS — C7951 Secondary malignant neoplasm of bone: Secondary | ICD-10-CM | POA: Diagnosis not present

## 2017-12-26 DIAGNOSIS — Z902 Acquired absence of lung [part of]: Secondary | ICD-10-CM

## 2017-12-26 DIAGNOSIS — C3492 Malignant neoplasm of unspecified part of left bronchus or lung: Secondary | ICD-10-CM

## 2017-12-26 DIAGNOSIS — Z9221 Personal history of antineoplastic chemotherapy: Secondary | ICD-10-CM | POA: Diagnosis not present

## 2017-12-26 DIAGNOSIS — Z79899 Other long term (current) drug therapy: Secondary | ICD-10-CM

## 2017-12-26 LAB — CBC WITH DIFFERENTIAL (CANCER CENTER ONLY)
Basophils Absolute: 0 10*3/uL (ref 0.0–0.1)
Basophils Relative: 0 %
EOS PCT: 2 %
Eosinophils Absolute: 0.1 10*3/uL (ref 0.0–0.5)
HEMATOCRIT: 42.6 % (ref 38.4–49.9)
Hemoglobin: 14.8 g/dL (ref 13.0–17.1)
LYMPHS ABS: 1.3 10*3/uL (ref 0.9–3.3)
Lymphocytes Relative: 19 %
MCH: 33.3 pg (ref 27.2–33.4)
MCHC: 34.7 g/dL (ref 32.0–36.0)
MCV: 95.8 fL (ref 79.3–98.0)
MONO ABS: 0.5 10*3/uL (ref 0.1–0.9)
MONOS PCT: 7 %
NEUTROS ABS: 4.7 10*3/uL (ref 1.5–6.5)
Neutrophils Relative %: 72 %
PLATELETS: 152 10*3/uL (ref 140–400)
RBC: 4.45 MIL/uL (ref 4.20–5.82)
RDW: 14.1 % (ref 11.0–14.6)
WBC Count: 6.5 10*3/uL (ref 4.0–10.3)

## 2017-12-26 LAB — CMP (CANCER CENTER ONLY)
ALT: 18 U/L (ref 0–44)
AST: 20 U/L (ref 15–41)
Albumin: 3.9 g/dL (ref 3.5–5.0)
Alkaline Phosphatase: 97 U/L (ref 38–126)
Anion gap: 6 (ref 5–15)
BILIRUBIN TOTAL: 0.9 mg/dL (ref 0.3–1.2)
BUN: 15 mg/dL (ref 8–23)
CO2: 27 mmol/L (ref 22–32)
CREATININE: 0.89 mg/dL (ref 0.61–1.24)
Calcium: 9.5 mg/dL (ref 8.9–10.3)
Chloride: 108 mmol/L (ref 98–111)
Glucose, Bld: 99 mg/dL (ref 70–99)
POTASSIUM: 4.6 mmol/L (ref 3.5–5.1)
Sodium: 141 mmol/L (ref 135–145)
TOTAL PROTEIN: 6.4 g/dL — AB (ref 6.5–8.1)

## 2017-12-26 LAB — TSH

## 2017-12-26 MED ORDER — HEPARIN SOD (PORK) LOCK FLUSH 100 UNIT/ML IV SOLN
500.0000 [IU] | Freq: Once | INTRAVENOUS | Status: DC | PRN
  Filled 2017-12-26: qty 5

## 2017-12-26 MED ORDER — SODIUM CHLORIDE 0.9 % IV SOLN
200.0000 mg | Freq: Once | INTRAVENOUS | Status: AC
  Administered 2017-12-26: 200 mg via INTRAVENOUS
  Filled 2017-12-26: qty 8

## 2017-12-26 MED ORDER — SODIUM CHLORIDE 0.9% FLUSH
10.0000 mL | INTRAVENOUS | Status: DC | PRN
  Administered 2017-12-26: 10 mL
  Filled 2017-12-26: qty 10

## 2017-12-26 MED ORDER — HEPARIN SOD (PORK) LOCK FLUSH 100 UNIT/ML IV SOLN
500.0000 [IU] | Freq: Once | INTRAVENOUS | Status: AC | PRN
  Administered 2017-12-26: 500 [IU]
  Filled 2017-12-26: qty 5

## 2017-12-26 MED ORDER — CLINDAMYCIN PHOSPHATE 1 % EX GEL: Freq: Two times a day (BID) | CUTANEOUS | 2 refills | Status: DC | PRN

## 2017-12-26 MED ORDER — SODIUM CHLORIDE 0.9 % IV SOLN
Freq: Once | INTRAVENOUS | Status: AC
  Administered 2017-12-26: 14:00:00 via INTRAVENOUS
  Filled 2017-12-26: qty 250

## 2017-12-26 NOTE — Assessment & Plan Note (Addendum)
This is a 67 year old white male with metastatic non-small cell lung cancer, adenocarcinoma with no actionable mutations and PDL 1 expression of 5% that was initially diagnosed as stage IIIa non-small cell lung cancer, adenocarcinoma status post left lower lobectomy with lymph node dissection followed by a course of concurrent chemoradiation completed in January 2016. The patient had evidence for disease metastasis in October 2018 with metastatic disease to the left femur as well as left supraclavicular and right paratracheal lymph nodes. The patient is currently on systemic chemotherapy initially was with carboplatin, Alimta and Keytruda.  Carboplatin was discontinued secondary to hypersensitivity reaction starting from cycle #2.   He was also treated with 3 cycles of maintenance Alimta and Ketruda (pembrolizumab) but Alimta was discontinued secondary to intolerance. He is currently undergoing treatment with maintenance Keytruda as a single agent status post 9 cycles.  The patient continues to tolerate his treatment well with no concerning adverse effects. Recommend for him to proceed with cycle #10 of his treatment today as scheduled. He will return in 3 weeks for evaluation prior to cycle #11.  For the itching and mild rash just below his right eye, I have given a prescription for clindamycin gel.   For the hypothyroidism, he is followed by his primary care physician and he will have to adjust his dose of levothyroxine.  The patient was advised to call if he has any other concerning symptoms in the interval. The patient voices understanding of current disease status and treatment options and is in agreement with the current care plan. All questions were answered. The patient knows to call the clinic with any problems, questions or concerns. We can certainly see the patient much sooner if necessary.

## 2017-12-26 NOTE — Progress Notes (Signed)
[facility] OFFICE PROGRESS NOTE  [name], MD [address] [place] [address]  DIAGNOSIS: Metastatic non-small cell lung cancer initially diagnosed as stage IIIA (T2a, N2, M0) non-small cell lung cancer, poorly differentiated adenocarcinoma presented with left lower lobe lung mass in addition to mediastinal lymphadenopathy.  The patient was diagnosed with metastatic disease involving the left femur as well as left supraclavicular nodal metastases and right paratracheal lymphadenopathy in October 2018.  Biomarker Findings Microsatellite Status - MS-Stable Tumor Mutational Burden - TMB-Low (3 Muts/Mb) Genomic Findings For a complete list of the genes assayed, please refer to the Appendix. STK11 P271f*6 [id number] site 1983+3A>SDAXX E374* MLL2 [id number] NBN [id number] NOTCH2 [id number] splice site [id number]+4L>PDisease relevant genes with no reportable alterations: EGFR, KRAS, ALK, BRAF, MET, RET, ERBB2, ROS1   PDL1 expression 5%  PRIOR THERAPY: 1) status post wedge resection of the left lower lobe lung mass as well as AP window lymph node dissection but there was residual metastatic mediastinal lymphadenopathy that could not be resected. 2) a course of concurrent chemorad[place] carboplatin [place]  3) status post palliative radiotherapy to the left femur metastatic bone disease. 4)  Systemic chemotherapy with carboplatin for AUC of 5, Alimta 500 mg/M2 and Keytruda 200 mg IV every 3 weeks.  First dose March 07, 2017.  Carboplatin was discontinued during cycle #2 secondary to hypersensitivity reaction. 5) status post 2 cycles of maintenance treatment with Alimta and Ketruda (pembrolizumab).  Alimta was discontinued secondary to intolerance.  CURRENT THERAPY: Maintenance treatment with single agent Keytruda (pembrolizumab) status post 9 cycles.  INTERVAL HISTORY: MicBRAINARD HIGHFILL 68o.  male returns for routine follow-up visit accompanied by his wife.  The patient is feeling fine today and has no specific complaints except for intermittent itching below his right eye.  He reports that he gets the occasional rash to this area.  No other rashes noted.  Denies fevers and chills.  Denies chest pain, shortness breath, cough, hemoptysis.  Denies nausea, vomiting, constipation, diarrhea.  Denies recent weight loss or night sweats.  The patient is here for evaluation prior to cycle #10 of his treatment.  MEDICAL HISTORY: Past Medical History:  Diagnosis Date  . Adenocarcinoma of left lung, stage 3 ([place]/16/2018  . Arthritis   . Atrial fibrillation ([place]/16/2018  . Atrial fibrillation ([facility] . COPD (chronic obstructive pulmonary disease) ([place]/16/2018  . History of chemotherapy   . History of radiation therapy   . Hyperlipidemia   . Hypothyroid 08/15/2016  . Longstanding persistent atrial fibrillation ([facility]/30/2018  . Pathologic fracture    left femur  . Pneumonitis   . S/P TURP 08/15/2016  . Wears glasses   . Wears hearing aid in both ears     ALLERGIES:  is allergic to carboplatin and flecainide.  MEDICATIONS:  Current Outpatient Medications  Medication Sig Dispense Refill  . apixaban (ELIQUIS) 5 MG TABS tablet Take 5 mg by mouth 2 (two) times daily.    . aMarland Kitchenenolol (TENORMIN) 50 MG tablet TAKE 1 TABLET DAILY (DISCONTINUE METOPROLOL TART 50MG) 90 tablet 3  . atorvastatin (LIPITOR) 40 MG tablet Take 40 mg by mouth daily.    . citalopram (CELEXA) 40 MG tablet Take 40 mg by mouth daily.    . lMarland Kitchenvothyroxine (SYNTHROID, LEVOTHROID) 200 MCG tablet Take 1 tablet (200 mcg total) by mouth daily before breakfast. 90 tablet 1  . levothyroxine (SYNTHROID, LEVOTHROID)  25 MCG tablet Take 1.5 tablets (37.5 mcg total) by mouth daily. (Patient taking differently: Take 25 mcg by mouth daily. ) 135 tablet 1  . sennosides-docusate sodium (SENOKOT-S) 8.6-50 MG tablet Take 2 tablets by  mouth daily. 30 tablet 1  . tamsulosin (FLOMAX) 0.4 MG CAPS capsule Take 0.4 mg daily by mouth.     . baclofen (LIORESAL) 10 MG tablet Take 1 tablet (10 mg total) by mouth 3 (three) times daily. As needed for muscle spasm (Patient not taking: Reported on 12/26/2017) 50 tablet 0  . clindamycin (CLINDAGEL) 1 % gel Apply topically 2 (two) times daily as needed. For rash on face 60 g 2  . ibuprofen (ADVIL,MOTRIN) 400 MG tablet Take 400 mg by mouth every 4 (four) hours as needed for moderate pain.    Marland Kitchen LORazepam (ATIVAN) 0.5 MG tablet Take 1 tablet (0.5 mg total) by mouth every 8 (eight) hours as needed (nausea). (Patient not taking: Reported on 12/26/2017) 30 tablet 0  . ondansetron (ZOFRAN) 4 MG tablet Take 1 tablet (4 mg total) by mouth every 8 (eight) hours as needed for nausea or vomiting. (Patient not taking: Reported on 12/26/2017) 30 tablet 0  . oxyCODONE (ROXICODONE) 5 MG immediate release tablet Take 1-2 tablets (5-10 mg total) by mouth every 4 (four) hours as needed for severe pain. (Patient not taking: Reported on 12/26/2017) 50 tablet 0  . prochlorperazine (COMPAZINE) 10 MG tablet Take 1 tablet (10 mg total) by mouth every 6 (six) hours as needed for nausea or vomiting. (Patient not taking: Reported on 12/26/2017) 30 tablet 0  . zolpidem (AMBIEN) 5 MG tablet Take 5 mg by mouth at bedtime as needed for sleep.     No current facility-administered medications for this visit.    Facility-Administered Medications Ordered in Other Visits  Medication Dose Route Frequency Provider Last Rate Last Dose  . sodium chloride flush (NS) 0.9 % injection 10 mL  10 mL Intracatheter PRN Curt Bears, MD   10 mL at 12/26/17 1525    SURGICAL HISTORY:  Past Surgical History:  Procedure Laterality Date  . BRONCHOSCOPY  10/2014  . CARDIAC CATHETERIZATION     05/07/12  . CARDIOVERSION     x2  . COLONOSCOPY    . DG BIOPSY LUNG Left 10/2014   FNA - Adenocarcinoma   . FEMUR IM NAIL Left 02/19/2017  . FEMUR IM  NAIL Left 02/19/2017   Procedure: INTRAMEDULLARY (IM) NAIL FEMORAL;  Surgeon: Marchia Bond, MD;  Location: [facility];  Service: Orthopedics;  Laterality: Left;  . IR FLUORO GUIDE PORT INSERTION RIGHT  07/03/2017  . IR US GUIDE VASC ACCESS RIGHT  07/03/2017  . LUNG CANCER SURGERY Left 12/2014   Wedge Resection   . MULTIPLE TOOTH EXTRACTIONS    . Status post TURP    . TONSILLECTOMY      REVIEW OF SYSTEMS:   Review of Systems  Constitutional: Negative for appetite change, chills, fatigue, fever and unexpected weight change.  HENT:   Negative for mouth sores, nosebleeds, sore throat and trouble swallowing.   Eyes: Negative for eye problems and icterus.  Respiratory: Negative for cough, hemoptysis, shortness of breath and wheezing.   Cardiovascular: Negative for chest pain and leg swelling.  Gastrointestinal: Negative for abdominal pain, constipation, diarrhea, nausea and vomiting.  Genitourinary: Negative for bladder incontinence, difficulty urinating, dysuria, frequency and hematuria.   Musculoskeletal: Negative for back pain, gait problem, neck pain and neck stiffness.  Skin: Intermittent itching and rash to his  right eye. Neurological: Negative for dizziness, extremity weakness, gait problem, headaches, light-headedness and seizures.  Hematological: Negative for adenopathy. Does not bruise/bleed easily.  Psychiatric/Behavioral: Negative for confusion, depression and sleep disturbance. The patient is not nervous/anxious.     PHYSICAL EXAMINATION:  Blood pressure 127/71, pulse 93, temperature 98.7 F (37.1 C), temperature source Oral, resp. rate 17, height 6' 1.5" (1.867 m), weight 235 lb 9.6 oz (106.9 kg), SpO2 98 %.  ECOG PERFORMANCE STATUS: 1 - Symptomatic but completely ambulatory  Physical Exam  Constitutional: Oriented to person, place, and time and well-developed, well-nourished, and in no distress. No distress.  HENT:  Head: Normocephalic and atraumatic.  Mouth/Throat: Oropharynx  is clear and moist. No oropharyngeal exudate.  Eyes: Conjunctivae are normal. Right eye exhibits no discharge. Left eye exhibits no discharge. No scleral icterus.  Neck: Normal range of motion. Neck supple.  Cardiovascular: Normal rate, regular rhythm, normal heart sounds and intact distal pulses.   Pulmonary/Chest: Effort normal and breath sounds normal. No respiratory distress. No wheezes. No rales.  Abdominal: Soft. Bowel sounds are normal. Exhibits no distension and no mass. There is no tenderness.  Musculoskeletal: Normal range of motion. Exhibits no edema.  Lymphadenopathy:    No cervical adenopathy.  Neurological: Alert and oriented to person, place, and time. Exhibits normal muscle tone. Gait normal. Coordination normal.  Skin: Mild irritation just below the right eye. Psychiatric: Mood, memory and judgment normal.  Vitals reviewed.  LABORATORY DATA: Lab Results  Component Value Date   WBC 6.5 12/26/2017   HGB 14.8 12/26/2017   HCT 42.6 12/26/2017   MCV 95.8 12/26/2017   PLT 152 12/26/2017      Chemistry      Component Value Date/Time   NA 141 12/26/2017 1156   NA 136 04/04/2017 1141   K 4.6 12/26/2017 1156   K 4.4 04/04/2017 1141   CL 108 12/26/2017 1156   CO2 27 12/26/2017 1156   CO2 24 04/04/2017 1141   BUN 15 12/26/2017 1156   BUN 21.1 04/04/2017 1141   CREATININE 0.89 12/26/2017 1156   CREATININE 1.1 04/04/2017 1141      Component Value Date/Time   CALCIUM 9.5 12/26/2017 1156   CALCIUM 9.1 04/04/2017 1141   ALKPHOS 97 12/26/2017 1156   ALKPHOS 89 04/04/2017 1141   AST 20 12/26/2017 1156   AST 17 04/04/2017 1141   ALT 18 12/26/2017 1156   ALT 18 04/04/2017 1141   BILITOT 0.9 12/26/2017 1156   BILITOT 0.67 04/04/2017 1141       RADIOGRAPHIC STUDIES:  Ct Chest W Contrast  Result Date: 12/04/2017 CLINICAL DATA:  Lung cancer, ongoing immunotherapy. EXAM: CT CHEST, ABDOMEN, AND PELVIS WITH CONTRAST TECHNIQUE: Multidetector CT imaging of the chest,  abdomen and pelvis was performed following the standard protocol during bolus administration of intravenous contrast. CONTRAST:  181m OMNIPAQUE IOHEXOL 300 MG/ML  SOLN COMPARISON:  10/01/2017. FINDINGS: CT CHEST FINDINGS Cardiovascular: Right IJ Port-A-Cath terminates in the right atrium. Atherosclerotic calcification of the arterial vasculature. Right and left pulmonary arteries are enlarged. Coronary artery calcification. Heart is enlarged. Small amount pericardial fluid is unchanged. Mediastinum/Nodes: Mediastinal lymph nodes measure up to 11 mm in low right paratracheal station, as before. No hilar or axillary adenopathy. Esophagus is grossly unremarkable. Lungs/Pleura: Bullous emphysema. Multiple new and progressive areas of irregular peribronchovascular consolidation in the lungs bilaterally. Post treatment bronchiectasis, parenchymal retraction, architectural distortion and volume loss in the perihilar left hemithorax. Trace left pleural fluid. Narrowing of the left lower  lobe bronchus. Airway is otherwise unremarkable. Musculoskeletal: No worrisome lytic or sclerotic lesions. Left thoracotomy changes. CT ABDOMEN PELVIS FINDINGS Hepatobiliary: Subcentimeter low-attenuation lesion in the left hepatic lobe is too small to characterize. Numerous stones are seen in the gallbladder. No biliary ductal dilatation. Pancreas: Negative. Spleen: Negative. Adrenals/Urinary Tract: Adrenal glands are unremarkable. Low-attenuation lesions in the kidneys measure up to 2.5 cm on the left and are likely cysts. Ureters are decompressed. Bladder is grossly unremarkable. Stomach/Bowel: Stomach, small bowel, appendix and colon are unremarkable. Vascular/Lymphatic: Atherosclerotic calcification of the arterial vasculature without abdominal aortic aneurysm. No pathologically enlarged lymph nodes. Reproductive: Prostate is visualized. Other: No free fluid.  Mesenteries and peritoneum are unremarkable. Musculoskeletal: Postoperative  changes in the proximal left femur. No worrisome lytic or sclerotic lesions. Degenerative changes in the spine. IMPRESSION: 1. New and enlarging areas of irregular peribronchovascular consolidation in the lungs bilaterally may be infectious or inflammatory in etiology. Organizing pneumonia is another consideration. Difficult to definitively exclude metastatic disease. 2. Aortic atherosclerosis (ICD10-170.0). Coronary artery calcification. 3. Enlarged pulmonary arteries, indicative of pulmonary arterial hypertension. 4.  Emphysema (ICD10-J43.9). 5. Mild hepatomegaly. 6. Cholelithiasis. Electronically Signed   By: Lorin Picket M.D.   On: 12/04/2017 13:41   Ct Abdomen Pelvis W Contrast  Result Date: 12/04/2017 CLINICAL DATA:  Lung cancer, ongoing immunotherapy. EXAM: CT CHEST, ABDOMEN, AND PELVIS WITH CONTRAST TECHNIQUE: Multidetector CT imaging of the chest, abdomen and pelvis was performed following the standard protocol during bolus administration of intravenous contrast. CONTRAST:  116m OMNIPAQUE IOHEXOL 300 MG/ML  SOLN COMPARISON:  10/01/2017. FINDINGS: CT CHEST FINDINGS Cardiovascular: Right IJ Port-A-Cath terminates in the right atrium. Atherosclerotic calcification of the arterial vasculature. Right and left pulmonary arteries are enlarged. Coronary artery calcification. Heart is enlarged. Small amount pericardial fluid is unchanged. Mediastinum/Nodes: Mediastinal lymph nodes measure up to 11 mm in low right paratracheal station, as before. No hilar or axillary adenopathy. Esophagus is grossly unremarkable. Lungs/Pleura: Bullous emphysema. Multiple new and progressive areas of irregular peribronchovascular consolidation in the lungs bilaterally. Post treatment bronchiectasis, parenchymal retraction, architectural distortion and volume loss in the perihilar left hemithorax. Trace left pleural fluid. Narrowing of the left lower lobe bronchus. Airway is otherwise unremarkable. Musculoskeletal: No worrisome  lytic or sclerotic lesions. Left thoracotomy changes. CT ABDOMEN PELVIS FINDINGS Hepatobiliary: Subcentimeter low-attenuation lesion in the left hepatic lobe is too small to characterize. Numerous stones are seen in the gallbladder. No biliary ductal dilatation. Pancreas: Negative. Spleen: Negative. Adrenals/Urinary Tract: Adrenal glands are unremarkable. Low-attenuation lesions in the kidneys measure up to 2.5 cm on the left and are likely cysts. Ureters are decompressed. Bladder is grossly unremarkable. Stomach/Bowel: Stomach, small bowel, appendix and colon are unremarkable. Vascular/Lymphatic: Atherosclerotic calcification of the arterial vasculature without abdominal aortic aneurysm. No pathologically enlarged lymph nodes. Reproductive: Prostate is visualized. Other: No free fluid.  Mesenteries and peritoneum are unremarkable. Musculoskeletal: Postoperative changes in the proximal left femur. No worrisome lytic or sclerotic lesions. Degenerative changes in the spine. IMPRESSION: 1. New and enlarging areas of irregular peribronchovascular consolidation in the lungs bilaterally may be infectious or inflammatory in etiology. Organizing pneumonia is another consideration. Difficult to definitively exclude metastatic disease. 2. Aortic atherosclerosis (ICD10-170.0). Coronary artery calcification. 3. Enlarged pulmonary arteries, indicative of pulmonary arterial hypertension. 4.  Emphysema (ICD10-J43.9). 5. Mild hepatomegaly. 6. Cholelithiasis. Electronically Signed   By: MLorin PicketM.D.   On: 12/04/2017 13:41     ASSESSMENT/PLAN:  Adenocarcinoma of left lung, stage 4 (HCC) This is a 67year old  white male with metastatic non-small cell lung cancer, adenocarcinoma with no actionable mutations and PDL 1 expression of 5% that was initially diagnosed as stage IIIa non-small cell lung cancer, adenocarcinoma status post left lower lobectomy with lymph node dissection followed by a course of concurrent  chemoradiation completed in January 2016. The patient had evidence for disease metastasis in October 2018 with metastatic disease to the left femur as well as left supraclavicular and right paratracheal lymph nodes. The patient is currently on systemic chemotherapy initially was with carboplatin, Alimta and Keytruda.  Carboplatin was discontinued secondary to hypersensitivity reaction starting from cycle #2.   He was also treated with 3 cycles of maintenance Alimta and Ketruda (pembrolizumab) but Alimta was discontinued secondary to intolerance. He is currently undergoing treatment with maintenance Keytruda as a single agent status post 9 cycles.  The patient continues to tolerate his treatment well with no concerning adverse effects. Recommend for him to proceed with cycle #10 of his treatment today as scheduled. He will return in 3 weeks for evaluation prior to cycle #11.  For the itching and mild rash just below his right eye, I have given a prescription for clindamycin gel.   For the hypothyroidism, he is followed by his primary care physician and he will have to adjust his dose of levothyroxine.  The patient was advised to call if he has any other concerning symptoms in the interval. The patient voices understanding of current disease status and treatment options and is in agreement with the current care plan. All questions were answered. The patient knows to call the clinic with any problems, questions or concerns. We can certainly see the patient much sooner if necessary.   No orders of the defined types were placed in this encounter.    Mikey Bussing, DNP, AGPCNP-BC, AOCNP 12/26/17

## 2017-12-26 NOTE — Telephone Encounter (Signed)
Appts already scheduled per 9/26 los - no additional appts added.

## 2018-01-02 ENCOUNTER — Ambulatory Visit (INDEPENDENT_AMBULATORY_CARE_PROVIDER_SITE_OTHER): Payer: Medicare HMO | Admitting: Cardiovascular Disease

## 2018-01-02 ENCOUNTER — Encounter: Payer: Self-pay | Admitting: Cardiovascular Disease

## 2018-01-02 VITALS — BP 107/74 | HR 95 | Ht 73.5 in | Wt 238.4 lb

## 2018-01-02 DIAGNOSIS — Z5181 Encounter for therapeutic drug level monitoring: Secondary | ICD-10-CM | POA: Diagnosis not present

## 2018-01-02 DIAGNOSIS — I4819 Other persistent atrial fibrillation: Secondary | ICD-10-CM | POA: Diagnosis not present

## 2018-01-02 DIAGNOSIS — E785 Hyperlipidemia, unspecified: Secondary | ICD-10-CM | POA: Diagnosis not present

## 2018-01-02 DIAGNOSIS — C3492 Malignant neoplasm of unspecified part of left bronchus or lung: Secondary | ICD-10-CM | POA: Diagnosis not present

## 2018-01-02 DIAGNOSIS — C3490 Malignant neoplasm of unspecified part of unspecified bronchus or lung: Secondary | ICD-10-CM

## 2018-01-02 MED ORDER — APIXABAN 5 MG PO TABS: 5.0000 mg | ORAL_TABLET | Freq: Two times a day (BID) | ORAL | 3 refills | Status: DC

## 2018-01-02 MED ORDER — ATENOLOL 50 MG PO TABS: 50.0000 mg | ORAL_TABLET | Freq: Every day | ORAL | 3 refills | Status: DC

## 2018-01-02 NOTE — Addendum Note (Signed)
Addended by: Alvina Filbert B on: 01/02/2018 05:19 PM   Modules accepted: Orders

## 2018-01-02 NOTE — Progress Notes (Signed)
Cardiology Office Note   Date:  01/02/2018   ID:  Victor Huber, DOB 1951-03-22, MRN [id number]  PCP:  [name], MD  Cardiologist:   Skeet Latch, MD   Chief Complaint  Patient presents with  . Follow-up     History of Present Illness: Victor Huber is a 67 y.o. male with persistent atrial fibrillation, COPD, hypothyroidism and metastatic non-small cell lung cancer s/p resection and on chemotherapy and prior tobacco abuse here for follow up.  He moved from [place] 05/2016 and established care with Dr. Julien Nordmann.  He was diagnosed with atrial fibrillation in 2012.  He has been chronically in atrial fibrillation lately.  In the past he was on amiodarone. However this was discontinued when he was diagnosed with lung cancer in 2016. After that he was switched to sotalol but failed subsequent DCCV.  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  Victor Huber reported fatigue and shortness of breath. He was switched from metoprolol to atenolol, given that he previously felt better on this medication.  He was also referred to the atrial fibrillation clinic where he discussed using dofetilide, amiodarone, and ablation. He was not felt to be a good candidate for ablation declined antiarrhythmics due to cost.  Since his last appointment Victor Huber was started on carboplatin and Keytruda for metastatic lung cancer.  The carboplatin was discontinued because he is not tolerating therapy.  Since taking only Beryle Flock has been doing very well.  He barely been knows he is on treatment.  He has occasional episodes of chest discomfort, though he attributes them to indigestion.  It typically occurs after eating and always goes away on its own.  His breathing has been stable.  He does have dyspnea on exertion that is unchanged.  He feels his heart racing when he exerts himself.  This lasts for for 5 minutes and improves on its own.  He has no lower extremity edema,  orthopnea, or PND.  He notes that he has been vaping THC.  This helps him with anxiety.   Past Medical History:  Diagnosis Date  . Adenocarcinoma of left lung, stage 3 ([facility]) 08/15/2016  . Arthritis   . Atrial fibrillation ([facility]) 08/15/2016  . Atrial fibrillation ([facility])   . COPD (chronic obstructive pulmonary disease) ([facility]) 08/15/2016  . History of chemotherapy   . History of radiation therapy   . Hyperlipidemia   . Hypothyroid 08/15/2016  . Longstanding persistent atrial fibrillation 08/29/2016  . Pathologic fracture    left femur  . Pneumonitis   . S/P TURP 08/15/2016  . Wears glasses   . Wears hearing aid in both ears     Past Surgical History:  Procedure Laterality Date  . BRONCHOSCOPY  10/2014  . CARDIAC CATHETERIZATION     05/07/12  . CARDIOVERSION     x2  . COLONOSCOPY    . DG BIOPSY LUNG Left 10/2014   FNA - Adenocarcinoma   . FEMUR IM NAIL Left 02/19/2017  . FEMUR IM NAIL Left 02/19/2017   Procedure: INTRAMEDULLARY (IM) NAIL FEMORAL;  Surgeon: Marchia Bond, MD;  Location: [facility];  Service: Orthopedics;  Laterality: Left;  . IR FLUORO GUIDE PORT INSERTION RIGHT  07/03/2017  . IR US GUIDE VASC ACCESS RIGHT  07/03/2017  . LUNG CANCER SURGERY Left 12/2014   Wedge Resection   . MULTIPLE TOOTH EXTRACTIONS    . Status post TURP    . TONSILLECTOMY  Current Outpatient Medications  Medication Sig Dispense Refill  . apixaban (ELIQUIS) 5 MG TABS tablet Take 5 mg by mouth 2 (two) times daily.    Marland Kitchen atenolol (TENORMIN) 50 MG tablet TAKE 1 TABLET DAILY (DISCONTINUE METOPROLOL TART 50MG ) 90 tablet 3  . atorvastatin (LIPITOR) 40 MG tablet Take 40 mg by mouth daily.    . baclofen (LIORESAL) 10 MG tablet Take 1 tablet (10 mg total) by mouth 3 (three) times daily. As needed for muscle spasm 50 tablet 0  . citalopram (CELEXA) 40 MG tablet Take 40 mg by mouth daily.    . clindamycin (CLINDAGEL) 1 % gel Apply topically 2 (two) times daily as needed. For rash on face 60 g 2  . ibuprofen  (ADVIL,MOTRIN) 400 MG tablet Take 400 mg by mouth every 4 (four) hours as needed for moderate pain.    Marland Kitchen levothyroxine (SYNTHROID, LEVOTHROID) 200 MCG tablet Take 1 tablet (200 mcg total) by mouth daily before breakfast. 90 tablet 1  . levothyroxine (SYNTHROID, LEVOTHROID) 25 MCG tablet Take 1.5 tablets (37.5 mcg total) by mouth daily. (Patient taking differently: Take 25 mcg by mouth daily. ) 135 tablet 1  . LORazepam (ATIVAN) 0.5 MG tablet Take 1 tablet (0.5 mg total) by mouth every 8 (eight) hours as needed (nausea). 30 tablet 0  . ondansetron (ZOFRAN) 4 MG tablet Take 1 tablet (4 mg total) by mouth every 8 (eight) hours as needed for nausea or vomiting. 30 tablet 0  . oxyCODONE (ROXICODONE) 5 MG immediate release tablet Take 1-2 tablets (5-10 mg total) by mouth every 4 (four) hours as needed for severe pain. 50 tablet 0  . prochlorperazine (COMPAZINE) 10 MG tablet Take 1 tablet (10 mg total) by mouth every 6 (six) hours as needed for nausea or vomiting. 30 tablet 0  . sennosides-docusate sodium (SENOKOT-S) 8.6-50 MG tablet Take 2 tablets by mouth daily. 30 tablet 1  . tamsulosin (FLOMAX) 0.4 MG CAPS capsule Take 0.4 mg daily by mouth.     . zolpidem (AMBIEN) 5 MG tablet Take 5 mg by mouth at bedtime as needed for sleep.     No current facility-administered medications for this visit.     Allergies:   Carboplatin and Flecainide    Social History:  The patient  reports that he quit smoking about 5 years ago. His smoking use included cigarettes. He has a 50.00 pack-year smoking history. He has never used smokeless tobacco. He reports that he does not drink alcohol or use drugs.   Family History:  The patient's family history includes Breast cancer in his maternal aunt, maternal aunt, and mother.    ROS:  Please see the history of present illness.   Otherwise, review of systems are positive for fatigue.   All other systems are reviewed and negative.    PHYSICAL EXAM: VS:  BP 107/74   Pulse  95   Ht 6' 1.5" (1.867 m)   Wt 238 lb 6.4 oz (108.1 kg)   BMI 31.03 kg/m  , BMI Body mass index is 31.03 kg/m. GENERAL:  Well appearing HEENT: Pupils equal round and reactive, fundi not visualized, oral mucosa unremarkable NECK:  No jugular venous distention, waveform within normal limits, carotid upstroke brisk and symmetric, no bruits, no thyromegaly LYMPHATICS:  No cervical adenopathy LUNGS:  Mild rhonchi at L base HEART:  Irregularly irregular.  PMI not displaced or sustained,S1 and S2 within normal limits, no S3, no S4, no clicks, no rubs, no murmurs ABD:  Flat,  positive bowel sounds normal in frequency in pitch, no bruits, no rebound, no guarding, no midline pulsatile mass, no hepatomegaly, no splenomegaly EXT:  2 plus pulses throughout, no edema, no cyanosis no clubbing SKIN:  No rashes no nodules NEURO:  Cranial nerves II through XII grossly intact, motor grossly intact throughout PSYCH:  Cognitively intact, oriented to person place and time    EKG:  EKG is ordered today. The ekg ordered 08/29/16 demonstrates atrial fibrillation. Rate 92 bpm. PVCs. Right axis deviation. Incomplete right bundle branch block. Nonspecific ST/T changes.  01/02/18: Atrial fibrillation.  Rate 95 bpm.   Recent Labs: 12/26/2017: ALT 18; BUN 15; Creatinine 0.89; Hemoglobin 14.8; Platelet Count 152; Potassium 4.6; Sodium 141; TSH <0.080    Lipid Panel    Component Value Date/Time   CHOL 115 12/19/2016 0933   TRIG 239 (H) 12/19/2016 0933   HDL 33 (L) 12/19/2016 0933   CHOLHDL 3.5 12/19/2016 0933   LDLCALC 34 12/19/2016 0933      Wt Readings from Last 3 Encounters:  01/02/18 238 lb 6.4 oz (108.1 kg)  12/26/17 235 lb 9.6 oz (106.9 kg)  12/05/17 237 lb 11.2 oz (107.8 kg)      ASSESSMENT AND PLAN:  # Longstanding persistent atrial fibrillation: # Fatigue: Victor Huber continues to have fatigue and shortness of breath that are stable it is unclear how much of this is the atrial fibrillation  versus the lung cancer and chemotherapy.  He is not a candidate for ablation.  He does not want to try antiarrhythmics at this time due to cost.  He is not a candidate for amiodarone given his lung disease and hypothyroidism.  His rate is well-controlled.  Continue atenolol and Eliquis.  # Hyperlipidemia: Continue atorvastatin.  He will have lipids checked with his next blood draw.  # Anxiety: # THC use:  We discussed that there are medically approved ways of managing anxiety if he needs help with this.  However he also notes that he likes the high that he receives in the [facility].  Victor Huber was advised to avoid vaping and oral inhalation given his lung disease.  He was also advised of the recent deaths and acute lung injury attributed to vaping.  He expressed understanding.    Current medicines are reviewed at length with the patient today.  The patient does not have concerns regarding medicines.  The following changes have been made:  no change  Labs/ tests ordered today include:   No orders of the defined types were placed in this encounter.    Disposition:   FU with Cloyce Paterson C. Oval Linsey, MD, [facility] in 1 year.    Signed, Catalina Salasar C. Oval Linsey, MD, [facility]  01/02/2018 9:16 AM    [place]

## 2018-01-02 NOTE — Patient Instructions (Signed)
Medication Instructions:  Your physician recommends that you continue on your current medications as directed. Please refer to the Current Medication list given to you today.  Labwork: FASTING LIPIDS AT YOUR NEXT LAB CHECK   Testing/Procedures: NONE  Follow-Up: Your physician wants you to follow-up in: 1 YEAR  You will receive a reminder letter in the mail two months in advance. If you don't receive a letter, please call our office to schedule the follow-up appointment.  If you need a refill on your cardiac medications before your next appointment, please call your pharmacy.

## 2018-01-03 ENCOUNTER — Encounter: Payer: Self-pay | Admitting: Oncology

## 2018-01-16 ENCOUNTER — Inpatient Hospital Stay: Payer: Medicare HMO

## 2018-01-16 ENCOUNTER — Inpatient Hospital Stay: Payer: Medicare HMO | Attending: Internal Medicine

## 2018-01-16 ENCOUNTER — Inpatient Hospital Stay (HOSPITAL_BASED_OUTPATIENT_CLINIC_OR_DEPARTMENT_OTHER): Payer: Medicare HMO | Admitting: Oncology

## 2018-01-16 ENCOUNTER — Encounter: Payer: Self-pay | Admitting: Oncology

## 2018-01-16 ENCOUNTER — Telehealth: Payer: Self-pay | Admitting: Oncology

## 2018-01-16 VITALS — BP 119/78 | HR 84 | Temp 97.9°F | Resp 18 | Ht 73.5 in | Wt 239.9 lb

## 2018-01-16 DIAGNOSIS — C7951 Secondary malignant neoplasm of bone: Secondary | ICD-10-CM | POA: Insufficient documentation

## 2018-01-16 DIAGNOSIS — E785 Hyperlipidemia, unspecified: Secondary | ICD-10-CM | POA: Insufficient documentation

## 2018-01-16 DIAGNOSIS — Z902 Acquired absence of lung [part of]: Secondary | ICD-10-CM | POA: Insufficient documentation

## 2018-01-16 DIAGNOSIS — I4891 Unspecified atrial fibrillation: Secondary | ICD-10-CM | POA: Diagnosis not present

## 2018-01-16 DIAGNOSIS — Z5112 Encounter for antineoplastic immunotherapy: Secondary | ICD-10-CM

## 2018-01-16 DIAGNOSIS — C3492 Malignant neoplasm of unspecified part of left bronchus or lung: Secondary | ICD-10-CM

## 2018-01-16 DIAGNOSIS — Z95828 Presence of other vascular implants and grafts: Secondary | ICD-10-CM

## 2018-01-16 DIAGNOSIS — C3432 Malignant neoplasm of lower lobe, left bronchus or lung: Secondary | ICD-10-CM

## 2018-01-16 DIAGNOSIS — Z9221 Personal history of antineoplastic chemotherapy: Secondary | ICD-10-CM

## 2018-01-16 DIAGNOSIS — J449 Chronic obstructive pulmonary disease, unspecified: Secondary | ICD-10-CM | POA: Insufficient documentation

## 2018-01-16 DIAGNOSIS — Z79899 Other long term (current) drug therapy: Secondary | ICD-10-CM | POA: Insufficient documentation

## 2018-01-16 DIAGNOSIS — E039 Hypothyroidism, unspecified: Secondary | ICD-10-CM | POA: Insufficient documentation

## 2018-01-16 DIAGNOSIS — Z7901 Long term (current) use of anticoagulants: Secondary | ICD-10-CM | POA: Insufficient documentation

## 2018-01-16 DIAGNOSIS — M1990 Unspecified osteoarthritis, unspecified site: Secondary | ICD-10-CM | POA: Diagnosis not present

## 2018-01-16 LAB — CBC WITH DIFFERENTIAL (CANCER CENTER ONLY)
Abs Immature Granulocytes: 0.02 10*3/uL (ref 0.00–0.07)
Basophils Absolute: 0 10*3/uL (ref 0.0–0.1)
Basophils Relative: 0 %
EOS ABS: 0 10*3/uL (ref 0.0–0.5)
Eosinophils Relative: 0 %
HCT: 40.4 % (ref 39.0–52.0)
Hemoglobin: 14.5 g/dL (ref 13.0–17.0)
IMMATURE GRANULOCYTES: 0 %
LYMPHS ABS: 1 10*3/uL (ref 0.7–4.0)
Lymphocytes Relative: 17 %
MCH: 33.6 pg (ref 26.0–34.0)
MCHC: 35.9 g/dL (ref 30.0–36.0)
MCV: 93.5 fL (ref 80.0–100.0)
MONO ABS: 0.6 10*3/uL (ref 0.1–1.0)
MONOS PCT: 10 %
NEUTROS ABS: 4.3 10*3/uL (ref 1.7–7.7)
NEUTROS PCT: 73 %
Platelet Count: 144 10*3/uL — ABNORMAL LOW (ref 150–400)
RBC: 4.32 MIL/uL (ref 4.22–5.81)
RDW: 13.5 % (ref 11.5–15.5)
WBC Count: 5.9 10*3/uL (ref 4.0–10.5)
nRBC: 0 % (ref 0.0–0.2)

## 2018-01-16 LAB — CMP (CANCER CENTER ONLY)
ALK PHOS: 96 U/L (ref 38–126)
ALT: 19 U/L (ref 0–44)
AST: 20 U/L (ref 15–41)
Albumin: 3.8 g/dL (ref 3.5–5.0)
Anion gap: 11 (ref 5–15)
BILIRUBIN TOTAL: 0.9 mg/dL (ref 0.3–1.2)
BUN: 22 mg/dL (ref 8–23)
CALCIUM: 9.1 mg/dL (ref 8.9–10.3)
CO2: 24 mmol/L (ref 22–32)
CREATININE: 0.84 mg/dL (ref 0.61–1.24)
Chloride: 107 mmol/L (ref 98–111)
GFR, Est AFR Am: >60 mL/min (ref >60)
Glucose, Bld: 105 mg/dL — ABNORMAL HIGH (ref 70–99)
Potassium: 3.9 mmol/L (ref 3.5–5.1)
Sodium: 142 mmol/L (ref 135–145)
TOTAL PROTEIN: 6.2 g/dL — AB (ref 6.5–8.1)

## 2018-01-16 LAB — TSH: TSH: <0.08 u[IU]/mL — ABNORMAL LOW (ref 0.320–4.118)

## 2018-01-16 MED ORDER — SODIUM CHLORIDE 0.9% FLUSH
10.0000 mL | INTRAVENOUS | Status: DC | PRN
  Administered 2018-01-16: 10 mL
  Filled 2018-01-16: qty 10

## 2018-01-16 MED ORDER — SODIUM CHLORIDE 0.9 % IV SOLN
200.0000 mg | Freq: Once | INTRAVENOUS | Status: AC
  Administered 2018-01-16: 200 mg via INTRAVENOUS
  Filled 2018-01-16: qty 8

## 2018-01-16 MED ORDER — HEPARIN SOD (PORK) LOCK FLUSH 100 UNIT/ML IV SOLN
500.0000 [IU] | Freq: Once | INTRAVENOUS | Status: AC | PRN
  Administered 2018-01-16: 500 [IU]
  Filled 2018-01-16: qty 5

## 2018-01-16 MED ORDER — SODIUM CHLORIDE 0.9 % IV SOLN
Freq: Once | INTRAVENOUS | Status: AC
  Administered 2018-01-16: 10:00:00 via INTRAVENOUS
  Filled 2018-01-16: qty 250

## 2018-01-16 NOTE — Telephone Encounter (Signed)
Scheduled appt per 10/17 los - pt to get an updated schedule in treatment area.

## 2018-01-16 NOTE — Progress Notes (Signed)
[facility] OFFICE PROGRESS NOTE  Victor Dy, MD [address] [place] [address]  DIAGNOSIS:Metastatic non-small cell lung cancer initially diagnosed as stage IIIA (T2a, N2, M0) non-small cell lung cancer, poorly differentiated adenocarcinoma presented with left lower lobe lung mass in addition to mediastinal lymphadenopathy. The patient was diagnosed with metastatic disease involving the left femur as well as left supraclavicular nodal metastases and right paratracheal lymphadenopathy in October 2018.  Biomarker Findings Microsatellite Status - MS-Stable Tumor Mutational Burden - TMB-Low (3 Muts/Mb) Genomic Findings For a complete list of the genes assayed, please refer to the Appendix. STK11 P238f*6 [id number] site 1546+5K>CDAXX E374* MLL2 [id number] NBN K23332f NOTCH2 [id number] splice site 988001+7C>BDisease relevant genes with no reportable alterations: EGFR, KRAS, ALK, BRAF, MET, RET, ERBB2, ROS1   PDL1 expression5%  PRIOR THERAPY: 1) status post wedge resection of the left lower lobe lung mass as well as AP window lymph node dissection but there was residual metastatic mediastinal lymphadenopathy that could not be resected. 2) a course of [place] chem[place]ad[place]  3) status post palliative radiotherapy to the left femur metastatic bone disease. 4) Systemic chemotherapy with carboplatin for AUC of 5, Alimta 500 mg/M2 and Keytruda 200 mg IV every 3 weeks. First dose March 07, 2017. Carboplatin was discontinued during cycle #2 secondary to hypersensitivity reaction. 5) status post 2 cycles of maintenance treatment with Alimta and Ketruda (pembrolizumab). Alimta was discontinued secondary to intolerance.  CURRENT THERAPY: Maintenance treatment with single agent Keytruda (pembrolizumab) status post10cycles.  INTERVAL HISTORY: Victor Huber 65o.  male returns for routine follow-up visit accompanied by his son.  The patient is feeling fine today and has no specific complaints except for mild itching.  He uses hydrocortisone cream and clindamycin gel as needed.  This helps some.  He denies fevers and chills.  Denies chest pain, shortness of breath, cough, hemoptysis.  Denies nausea, vomiting, constipation, diarrhea.  Denies recent weight loss or night sweats.  The patient is here for evaluation prior to cycle #11 of his maintenance Keytruda.  MEDICAL HISTORY: Past Medical History:  Diagnosis Date  . Adenocarcinoma of left lung, stage 3 ([facility]/16/2018  . Arthritis   . Atrial fibrillation ([place]/16/2018  . Atrial fibrillation ([facility] . COPD (chronic obstructive pulmonary disease) ([place]/16/2018  . History of chemotherapy   . History of radiation therapy   . Hyperlipidemia   . Hypothyroid 08/15/2016  . Longstanding persistent atrial fibrillation 08/29/2016  . Pathologic fracture    left femur  . Pneumonitis   . S/P TURP 08/15/2016  . Wears glasses   . Wears hearing aid in both ears     ALLERGIES:  is allergic to carboplatin and flecainide.  MEDICATIONS:  Current Outpatient Medications  Medication Sig Dispense Refill  . apixaban (ELIQUIS) 5 MG TABS tablet Take 1 tablet (5 mg total) by mouth 2 (two) times daily. 180 tablet 3  . atenolol (TENORMIN) 50 MG tablet Take 1 tablet (50 mg total) by mouth daily. 90 tablet 3  . atorvastatin (LIPITOR) 40 MG tablet Take 40 mg by mouth daily.    . citalopram (CELEXA) 40 MG tablet Take 40 mg by mouth daily.    . clindamycin (CLINDAGEL) 1 % gel Apply topically 2 (two) times daily as needed. For rash on face 60 g 2  . ibuprofen (ADVIL,MOTRIN) 400 MG tablet Take 400 mg by mouth every 4 (  four) hours as needed for moderate pain.    Marland Kitchen levothyroxine (SYNTHROID, LEVOTHROID) 200 MCG tablet Take 1 tablet (200 mcg total) by mouth daily before breakfast. 90 tablet 1  . tamsulosin (FLOMAX) 0.4 MG CAPS  capsule Take 0.4 mg daily by mouth.     . baclofen (LIORESAL) 10 MG tablet Take 1 tablet (10 mg total) by mouth 3 (three) times daily. As needed for muscle spasm (Patient not taking: Reported on 01/16/2018) 50 tablet 0  . LORazepam (ATIVAN) 0.5 MG tablet Take 1 tablet (0.5 mg total) by mouth every 8 (eight) hours as needed (nausea). (Patient not taking: Reported on 01/16/2018) 30 tablet 0  . ondansetron (ZOFRAN) 4 MG tablet Take 1 tablet (4 mg total) by mouth every 8 (eight) hours as needed for nausea or vomiting. (Patient not taking: Reported on 01/16/2018) 30 tablet 0  . oxyCODONE (ROXICODONE) 5 MG immediate release tablet Take 1-2 tablets (5-10 mg total) by mouth every 4 (four) hours as needed for severe pain. (Patient not taking: Reported on 01/16/2018) 50 tablet 0  . prochlorperazine (COMPAZINE) 10 MG tablet Take 1 tablet (10 mg total) by mouth every 6 (six) hours as needed for nausea or vomiting. (Patient not taking: Reported on 01/16/2018) 30 tablet 0  . sennosides-docusate sodium (SENOKOT-S) 8.6-50 MG tablet Take 2 tablets by mouth daily. (Patient not taking: Reported on 01/16/2018) 30 tablet 1  . zolpidem (AMBIEN) 5 MG tablet Take 5 mg by mouth at bedtime as needed for sleep.     No current facility-administered medications for this visit.    Facility-Administered Medications Ordered in Other Visits  Medication Dose Route Frequency Provider Last Rate Last Dose  . sodium chloride flush (NS) 0.9 % injection 10 mL  10 mL Intracatheter PRN Curt Bears, MD   10 mL at 01/16/18 1128    SURGICAL HISTORY:  Past Surgical History:  Procedure Laterality Date  . BRONCHOSCOPY  10/2014  . CARDIAC CATHETERIZATION     05/07/12  . CARDIOVERSION     x2  . COLONOSCOPY    . DG BIOPSY LUNG Left 10/2014   FNA - Adenocarcinoma   . FEMUR IM NAIL Left 02/19/2017  . FEMUR IM NAIL Left 02/19/2017   Procedure: INTRAMEDULLARY (IM) NAIL FEMORAL;  Surgeon: Marchia Bond, MD;  Location: [facility];  Service:  Orthopedics;  Laterality: Left;  . IR FLUORO GUIDE PORT INSERTION RIGHT  07/03/2017  . IR US GUIDE VASC ACCESS RIGHT  07/03/2017  . LUNG CANCER SURGERY Left 12/2014   Wedge Resection   . MULTIPLE TOOTH EXTRACTIONS    . Status post TURP    . TONSILLECTOMY      REVIEW OF SYSTEMS:   Review of Systems  Constitutional: Negative for appetite change, chills, fatigue, fever and unexpected weight change.  HENT:   Negative for mouth sores, nosebleeds, sore throat and trouble swallowing.   Eyes: Negative for eye problems and icterus.  Respiratory: Negative for cough, hemoptysis, shortness of breath and wheezing.   Cardiovascular: Negative for chest pain and leg swelling.  Gastrointestinal: Negative for abdominal pain, constipation, diarrhea, nausea and vomiting.  Genitourinary: Negative for bladder incontinence, difficulty urinating, dysuria, frequency and hematuria.   Musculoskeletal: Negative for back pain, gait problem, neck pain and neck stiffness.  Skin: Positive for itching. Neurological: Negative for dizziness, extremity weakness, gait problem, headaches, light-headedness and seizures.  Hematological: Negative for adenopathy. Does not bruise/bleed easily.  Psychiatric/Behavioral: Negative for confusion, depression and sleep disturbance. The patient is not nervous/anxious.  PHYSICAL EXAMINATION:  Blood pressure 119/78, pulse 84, temperature 97.9 F (36.6 C), temperature source Oral, resp. rate 18, height 6' 1.5" (1.867 m), weight 239 lb 14.4 oz (108.8 kg), SpO2 94 %.  ECOG PERFORMANCE STATUS: 1 - Symptomatic but completely ambulatory  Physical Exam  Constitutional: Oriented to person, place, and time and well-developed, well-nourished, and in no distress. No distress.  HENT:  Head: Normocephalic and atraumatic.  Mouth/Throat: Oropharynx is clear and moist. No oropharyngeal exudate.  Eyes: Conjunctivae are normal. Right eye exhibits no discharge. Left eye exhibits no discharge. No  scleral icterus.  Neck: Normal range of motion. Neck supple.  Cardiovascular: Normal rate, regular rhythm, normal heart sounds and intact distal pulses.   Pulmonary/Chest: Effort normal and breath sounds normal. No respiratory distress. No wheezes. No rales.  Abdominal: Soft. Bowel sounds are normal. Exhibits no distension and no mass. There is no tenderness.  Musculoskeletal: Normal range of motion. Exhibits no edema.  Lymphadenopathy:    No cervical adenopathy.  Neurological: Alert and oriented to person, place, and time. Exhibits normal muscle tone. Gait normal. Coordination normal.  Skin: Skin is warm and dry. No rash noted. Not diaphoretic. No erythema. No pallor.  Psychiatric: Mood, memory and judgment normal.  Vitals reviewed.  LABORATORY DATA: Lab Results  Component Value Date   WBC 5.9 01/16/2018   HGB 14.5 01/16/2018   HCT 40.4 01/16/2018   MCV 93.5 01/16/2018   PLT 144 (L) 01/16/2018      Chemistry      Component Value Date/Time   NA 142 01/16/2018 0824   NA 136 04/04/2017 1141   K 3.9 01/16/2018 0824   K 4.4 04/04/2017 1141   CL 107 01/16/2018 0824   CO2 24 01/16/2018 0824   CO2 24 04/04/2017 1141   BUN 22 01/16/2018 0824   BUN 21.1 04/04/2017 1141   CREATININE 0.84 01/16/2018 0824   CREATININE 1.1 04/04/2017 1141      Component Value Date/Time   CALCIUM 9.1 01/16/2018 0824   CALCIUM 9.1 04/04/2017 1141   ALKPHOS 96 01/16/2018 0824   ALKPHOS 89 04/04/2017 1141   AST 20 01/16/2018 0824   AST 17 04/04/2017 1141   ALT 19 01/16/2018 0824   ALT 18 04/04/2017 1141   BILITOT 0.9 01/16/2018 0824   BILITOT 0.67 04/04/2017 1141       RADIOGRAPHIC STUDIES:  No results found.   ASSESSMENT/PLAN:  Adenocarcinoma of left lung, stage 4 (HCC) This is a 67 year old white male with metastatic non-small cell lung cancer, adenocarcinoma with no actionable mutations and PDL 1 expression of 5% that was initially diagnosed as stage IIIa non-small cell lung cancer,  adenocarcinoma status post left lower lobectomy with lymph node dissection followed by a course of concurrent chemoradiation completed in January 2016. The patient had evidence for disease metastasis in October 2018 with metastatic disease to the left femur as well as left supraclavicular and right paratracheal lymph nodes. The patient is currently on systemic chemotherapy initially was with carboplatin, Alimta and Keytruda. Carboplatin was discontinued secondary to hypersensitivity reaction starting from cycle #2.  He was also treated with 3 cycles of maintenance Alimta and Ketruda (pembrolizumab) but Alimta was discontinued secondary to intolerance. He is currently undergoing treatment with maintenance Keytruda as a single agent status post10cycles.  The patient continues to tolerate his treatment well with the exception of mild itching. Recommend for him to proceed with cycle #11 of his treatment today as scheduled.  He will have a  restaging CT scan of the chest, abdomen, pelvis prior to his next visit.  He will follow-up in 3 weeks for evaluation prior to cycle #12 and to review his restaging CT scan results.  For the itching, recommend he continue hydrocortisone advised gel.  We discussed using an antihistamine such as Claritin or Zyrtec if the itching worsens.   For the hypothyroidism, he is followed by his primary care physician and he will have to adjust his dose of levothyroxine.  Labs from today have been faxed to his primary care provider's office.  The patient was advised to call if he has any other concerning symptoms in the interval. The patient voices understanding of current disease status and treatment options and is in agreement with the current care plan. All questions were answered. The patient knows to call the clinic with any problems, questions or concerns. We can certainly see the patient much sooner if necessary.   Orders Placed This Encounter  Procedures  . CT ABDOMEN  PELVIS W CONTRAST    Standing Status:   Future    Standing Expiration Date:   01/17/2019    Order Specific Question:   If indicated for the ordered procedure, I authorize the administration of contrast media per Radiology protocol    Answer:   Yes    Order Specific Question:   Preferred imaging location?    Answer:   [facility]    Order Specific Question:   Radiology Contrast Protocol - do NOT remove file path    Answer:   _0 [email or web address]    Order Specific Question:   ** REASON FOR EXAM (FREE TEXT)    Answer:   Lung cancer. Restaging.  . CT CHEST W CONTRAST    Standing Status:   Future    Standing Expiration Date:   01/17/2019    Order Specific Question:   If indicated for the ordered procedure, I authorize the administration of contrast media per Radiology protocol    Answer:   Yes    Order Specific Question:   Preferred imaging location?    Answer:   [facility]    Order Specific Question:   Radiology Contrast Protocol - do NOT remove file path    Answer:   _1 [email or web address]    Order Specific Question:   ** REASON FOR EXAM (FREE TEXT)    Answer:   Lung cancer. Restaging.     Victor Bussing, DNP, AGPCNP-BC, AOCNP 01/16/18

## 2018-01-16 NOTE — Patient Instructions (Signed)
[facility] Discharge Instructions for Patients Receiving Chemotherapy  Today you received the following chemotherapy agents :  pembrolizumab Beryle Flock).  To help prevent nausea and vomiting after your treatment, we encourage you to take your nausea medication as prescribed.   If you develop nausea and vomiting that is not controlled by your nausea medication, call the clinic.   BELOW ARE SYMPTOMS THAT SHOULD BE REPORTED IMMEDIATELY:  *FEVER GREATER THAN 100.5 F  *CHILLS WITH OR WITHOUT FEVER  NAUSEA AND VOMITING THAT IS NOT CONTROLLED WITH YOUR NAUSEA MEDICATION  *UNUSUAL SHORTNESS OF BREATH  *UNUSUAL BRUISING OR BLEEDING  TENDERNESS IN MOUTH AND THROAT WITH OR WITHOUT PRESENCE OF ULCERS  *URINARY PROBLEMS  *BOWEL PROBLEMS  UNUSUAL RASH Items with * indicate a potential emergency and should be followed up as soon as possible.  Feel free to call the clinic should you have any questions or concerns. The clinic phone number is [phone].  Please show the [place] at check-in to the Emergency Department and triage nurse.

## 2018-01-16 NOTE — Assessment & Plan Note (Addendum)
This is a 67 year old white male with metastatic non-small cell lung cancer, adenocarcinoma with no actionable mutations and PDL 1 expression of 5% that was initially diagnosed as stage IIIa non-small cell lung cancer, adenocarcinoma status post left lower lobectomy with lymph node dissection followed by a course of concurrent chemoradiation completed in January 2016. The patient had evidence for disease metastasis in October 2018 with metastatic disease to the left femur as well as left supraclavicular and right paratracheal lymph nodes. The patient is currently on systemic chemotherapy initially was with carboplatin, Alimta and Keytruda. Carboplatin was discontinued secondary to hypersensitivity reaction starting from cycle #2.  He was also treated with 3 cycles of maintenance Alimta and Ketruda (pembrolizumab) but Alimta was discontinued secondary to intolerance. He is currently undergoing treatment with maintenance Keytruda as a single agent status post10cycles.  The patient continues to tolerate his treatment well with the exception of mild itching. Recommend for him to proceed with cycle #11 of his treatment today as scheduled.  He will have a restaging CT scan of the chest, abdomen, pelvis prior to his next visit.  He will follow-up in 3 weeks for evaluation prior to cycle #12 and to review his restaging CT scan results.  For the itching, recommend he continue hydrocortisone advised gel.  We discussed using an antihistamine such as Claritin or Zyrtec if the itching worsens.   For the hypothyroidism, he is followed by his primary care physician and he will have to adjust his dose of levothyroxine.  Labs from today have been faxed to his primary care provider's office.  The patient was advised to call if he has any other concerning symptoms in the interval. The patient voices understanding of current disease status and treatment options and is in agreement with the current care plan. All  questions were answered. The patient knows to call the clinic with any problems, questions or concerns. We can certainly see the patient much sooner if necessary.

## 2018-01-28 ENCOUNTER — Ambulatory Visit (HOSPITAL_COMMUNITY): Payer: Medicare HMO

## 2018-02-06 ENCOUNTER — Inpatient Hospital Stay: Payer: Medicare HMO

## 2018-02-06 ENCOUNTER — Inpatient Hospital Stay (HOSPITAL_BASED_OUTPATIENT_CLINIC_OR_DEPARTMENT_OTHER): Payer: Medicare HMO | Admitting: Internal Medicine

## 2018-02-06 ENCOUNTER — Inpatient Hospital Stay: Payer: Medicare HMO | Attending: Internal Medicine

## 2018-02-06 ENCOUNTER — Telehealth: Payer: Self-pay | Admitting: Internal Medicine

## 2018-02-06 ENCOUNTER — Encounter: Payer: Self-pay | Admitting: Internal Medicine

## 2018-02-06 VITALS — BP 109/80 | HR 71 | Temp 98.5°F | Resp 12 | Ht 73.0 in | Wt 241.2 lb

## 2018-02-06 DIAGNOSIS — Z9221 Personal history of antineoplastic chemotherapy: Secondary | ICD-10-CM | POA: Insufficient documentation

## 2018-02-06 DIAGNOSIS — C7951 Secondary malignant neoplasm of bone: Secondary | ICD-10-CM | POA: Diagnosis not present

## 2018-02-06 DIAGNOSIS — J449 Chronic obstructive pulmonary disease, unspecified: Secondary | ICD-10-CM | POA: Diagnosis not present

## 2018-02-06 DIAGNOSIS — C3492 Malignant neoplasm of unspecified part of left bronchus or lung: Secondary | ICD-10-CM

## 2018-02-06 DIAGNOSIS — Z79899 Other long term (current) drug therapy: Secondary | ICD-10-CM | POA: Diagnosis not present

## 2018-02-06 DIAGNOSIS — I4891 Unspecified atrial fibrillation: Secondary | ICD-10-CM | POA: Diagnosis not present

## 2018-02-06 DIAGNOSIS — Z5112 Encounter for antineoplastic immunotherapy: Secondary | ICD-10-CM | POA: Diagnosis not present

## 2018-02-06 DIAGNOSIS — Z7901 Long term (current) use of anticoagulants: Secondary | ICD-10-CM | POA: Insufficient documentation

## 2018-02-06 DIAGNOSIS — E039 Hypothyroidism, unspecified: Secondary | ICD-10-CM | POA: Diagnosis not present

## 2018-02-06 DIAGNOSIS — E785 Hyperlipidemia, unspecified: Secondary | ICD-10-CM | POA: Insufficient documentation

## 2018-02-06 DIAGNOSIS — C3432 Malignant neoplasm of lower lobe, left bronchus or lung: Secondary | ICD-10-CM | POA: Insufficient documentation

## 2018-02-06 DIAGNOSIS — Z902 Acquired absence of lung [part of]: Secondary | ICD-10-CM

## 2018-02-06 DIAGNOSIS — Z95828 Presence of other vascular implants and grafts: Secondary | ICD-10-CM

## 2018-02-06 DIAGNOSIS — M1990 Unspecified osteoarthritis, unspecified site: Secondary | ICD-10-CM | POA: Diagnosis not present

## 2018-02-06 LAB — CBC WITH DIFFERENTIAL (CANCER CENTER ONLY)
Abs Immature Granulocytes: 0.01 10*3/uL (ref 0.00–0.07)
BASOS ABS: 0 10*3/uL (ref 0.0–0.1)
Basophils Relative: 0 %
EOS PCT: 1 %
Eosinophils Absolute: 0 10*3/uL (ref 0.0–0.5)
HCT: 40.7 % (ref 39.0–52.0)
HEMOGLOBIN: 14.2 g/dL (ref 13.0–17.0)
IMMATURE GRANULOCYTES: 0 %
LYMPHS ABS: 1.1 10*3/uL (ref 0.7–4.0)
LYMPHS PCT: 20 %
MCH: 33.2 pg (ref 26.0–34.0)
MCHC: 34.9 g/dL (ref 30.0–36.0)
MCV: 95.1 fL (ref 80.0–100.0)
Monocytes Absolute: 0.4 10*3/uL (ref 0.1–1.0)
Monocytes Relative: 8 %
NEUTROS PCT: 71 %
NRBC: 0 % (ref 0.0–0.2)
Neutro Abs: 4 10*3/uL (ref 1.7–7.7)
Platelet Count: 163 10*3/uL (ref 150–400)
RBC: 4.28 MIL/uL (ref 4.22–5.81)
RDW: 13.5 % (ref 11.5–15.5)
WBC: 5.5 10*3/uL (ref 4.0–10.5)

## 2018-02-06 LAB — CMP (CANCER CENTER ONLY)
ALT: 22 U/L (ref 0–44)
AST: 25 U/L (ref 15–41)
Albumin: 3.6 g/dL (ref 3.5–5.0)
Alkaline Phosphatase: 98 U/L (ref 38–126)
Anion gap: 8 (ref 5–15)
BILIRUBIN TOTAL: 1 mg/dL (ref 0.3–1.2)
BUN: 19 mg/dL (ref 8–23)
CHLORIDE: 107 mmol/L (ref 98–111)
CO2: 24 mmol/L (ref 22–32)
CREATININE: 0.88 mg/dL (ref 0.61–1.24)
Calcium: 8.9 mg/dL (ref 8.9–10.3)
GFR, Est AFR Am: >60 mL/min (ref >60)
GLUCOSE: 135 mg/dL — AB (ref 70–99)
Potassium: 4.4 mmol/L (ref 3.5–5.1)
Sodium: 139 mmol/L (ref 135–145)
Total Protein: 6 g/dL — ABNORMAL LOW (ref 6.5–8.1)

## 2018-02-06 LAB — TSH: TSH: 0.088 u[IU]/mL — AB (ref 0.320–4.118)

## 2018-02-06 MED ORDER — SODIUM CHLORIDE 0.9% FLUSH
10.0000 mL | INTRAVENOUS | Status: DC | PRN
  Administered 2018-02-06: 10 mL
  Filled 2018-02-06: qty 10

## 2018-02-06 MED ORDER — HEPARIN SOD (PORK) LOCK FLUSH 100 UNIT/ML IV SOLN
500.0000 [IU] | Freq: Once | INTRAVENOUS | Status: AC | PRN
  Administered 2018-02-06: 500 [IU]
  Filled 2018-02-06: qty 5

## 2018-02-06 MED ORDER — SODIUM CHLORIDE 0.9 % IV SOLN
Freq: Once | INTRAVENOUS | Status: AC
  Administered 2018-02-06: 12:00:00 via INTRAVENOUS
  Filled 2018-02-06: qty 250

## 2018-02-06 MED ORDER — SODIUM CHLORIDE 0.9 % IV SOLN
Freq: Once | INTRAVENOUS | Status: DC
  Filled 2018-02-06: qty 250

## 2018-02-06 MED ORDER — SODIUM CHLORIDE 0.9 % IV SOLN
200.0000 mg | Freq: Once | INTRAVENOUS | Status: AC
  Administered 2018-02-06: 200 mg via INTRAVENOUS
  Filled 2018-02-06: qty 8

## 2018-02-06 NOTE — Telephone Encounter (Signed)
Scheduled appt per 11/7 los- pt to get an updated scheduled next visit.

## 2018-02-06 NOTE — Progress Notes (Signed)
[facility] [phone]   Fax:([phone]) [phone]  OFFICE PROGRESS NOTE  [name], MD [address] [place] [address]  DIAGNOSIS: Metastatic non-small cell lung cancer initially diagnosed as stage IIIA (T2a, N2, M0) non-small cell lung cancer, poorly differentiated adenocarcinoma presented with left lower lobe lung mass in addition to mediastinal lymphadenopathy.  The patient was diagnosed with metastatic disease involving the left femur as well as left supraclavicular nodal metastases and right paratracheal lymphadenopathy in October 2018.  Biomarker Findings Microsatellite Status - MS-Stable Tumor Mutational Burden - TMB-Low (3 Muts/Mb) Genomic Findings For a complete list of the genes assayed, please refer to the Appendix. STK11 P237f*6 [id number] site 1944+9Q>PDAXX E374* MLL2 [id number] NBN K23351f NOTCH2 [id number] splice site [id number]+5L>DDisease relevant genes with no reportable alterations: EGFR, KRAS, ALK, BRAF, MET, RET, ERBB2, ROS1   PDL1 expression 5%  PRIOR THERAPY: 1) status post wedge resection of the left lower lobe lung mass as well as AP window lymph node dissection but there was residual metastatic mediastinal lymphadenopathy that could not be resected. 2) a course of concurrent chemoradiation [place]  3) status post palliative radiotherapy to the left femur metastatic bone disease. 4)  Systemic chemotherapy with carboplatin for AUC of 5, Alimta 500 mg/M2 and Keytruda 200 mg IV every 3 weeks.  First dose March 07, 2017.  Carboplatin was discontinued during cycle #2 secondary to hypersensitivity reaction. 5) status post 2 cycles of maintenance treatment with Alimta and Ketruda (pembrolizumab).  Alimta was discontinued secondary to intolerance.  CURRENT THERAPY: Maintenance treatment with single agent Ketruda (pembrolizumab) status post 11  cycles.  INTERVAL HISTORY: Victor Huber 67o. male returns to the clinic today for follow-up visit accompanied by his son.  The patient is feeling fine today with no concerning complaints except for mild fatigue.  He also has mild skin rash on his face.  He has been applying clindamycin lotion with no improvement.  He denied having any chest pain, shortness breath, cough or hemoptysis.  He denied having any fever or chills.  He has no nausea, vomiting, diarrhea or constipation.  The patient is here today for evaluation before starting cycle #12 of his treatment with single agent Ketruda (pembrolizumab).  He was supposed to have repeat CT scan of the chest, abdomen and pelvis before this visit but because of insurance covering issues, it is a scheduled to be done next week.  MEDICAL HISTORY: Past Medical History:  Diagnosis Date  . Adenocarcinoma of left lung, stage 3 ([facility]/16/2018  . Arthritis   . Atrial fibrillation ([facility]16/2018  . Atrial fibrillation ([facility] . COPD (chronic obstructive pulmonary disease) ([facility]/16/2018  . History of chemotherapy   . History of radiation therapy   . Hyperlipidemia   . Hypothyroid 08/15/2016  . Longstanding persistent atrial fibrillation 08/29/2016  . Pathologic fracture    left femur  . Pneumonitis   . S/P TURP 08/15/2016  . Wears glasses   . Wears hearing aid in both ears     ALLERGIES:  is allergic to carboplatin and flecainide.  MEDICATIONS:  Current Outpatient Medications  Medication Sig Dispense Refill  . apixaban (ELIQUIS) 5 MG TABS tablet Take 1 tablet (5 mg total) by mouth 2 (two) times daily. 180 tablet 3  . atenolol (TENORMIN) 50 MG tablet Take 1 tablet (50 mg total) by mouth daily. 90 tablet 3  .  atorvastatin (LIPITOR) 40 MG tablet Take 40 mg by mouth daily.    . baclofen (LIORESAL) 10 MG tablet Take 1 tablet (10 mg total) by mouth 3 (three) times daily. As needed for muscle spasm (Patient not taking: Reported on 01/16/2018) 50  tablet 0  . citalopram (CELEXA) 40 MG tablet Take 40 mg by mouth daily.    . clindamycin (CLINDAGEL) 1 % gel Apply topically 2 (two) times daily as needed. For rash on face 60 g 2  . ibuprofen (ADVIL,MOTRIN) 400 MG tablet Take 400 mg by mouth every 4 (four) hours as needed for moderate pain.    Marland Kitchen levothyroxine (SYNTHROID, LEVOTHROID) 200 MCG tablet Take 1 tablet (200 mcg total) by mouth daily before breakfast. 90 tablet 1  . LORazepam (ATIVAN) 0.5 MG tablet Take 1 tablet (0.5 mg total) by mouth every 8 (eight) hours as needed (nausea). (Patient not taking: Reported on 01/16/2018) 30 tablet 0  . ondansetron (ZOFRAN) 4 MG tablet Take 1 tablet (4 mg total) by mouth every 8 (eight) hours as needed for nausea or vomiting. (Patient not taking: Reported on 01/16/2018) 30 tablet 0  . oxyCODONE (ROXICODONE) 5 MG immediate release tablet Take 1-2 tablets (5-10 mg total) by mouth every 4 (four) hours as needed for severe pain. (Patient not taking: Reported on 01/16/2018) 50 tablet 0  . prochlorperazine (COMPAZINE) 10 MG tablet Take 1 tablet (10 mg total) by mouth every 6 (six) hours as needed for nausea or vomiting. (Patient not taking: Reported on 01/16/2018) 30 tablet 0  . sennosides-docusate sodium (SENOKOT-S) 8.6-50 MG tablet Take 2 tablets by mouth daily. (Patient not taking: Reported on 01/16/2018) 30 tablet 1  . tamsulosin (FLOMAX) 0.4 MG CAPS capsule Take 0.4 mg daily by mouth.     . zolpidem (AMBIEN) 5 MG tablet Take 5 mg by mouth at bedtime as needed for sleep.     No current facility-administered medications for this visit.     SURGICAL HISTORY:  Past Surgical History:  Procedure Laterality Date  . BRONCHOSCOPY  10/2014  . CARDIAC CATHETERIZATION     05/07/12  . CARDIOVERSION     x2  . COLONOSCOPY    . DG BIOPSY LUNG Left 10/2014   FNA - Adenocarcinoma   . FEMUR IM NAIL Left 02/19/2017  . FEMUR IM NAIL Left 02/19/2017   Procedure: INTRAMEDULLARY (IM) NAIL FEMORAL;  Surgeon: Marchia Bond,  MD;  Location: [facility];  Service: Orthopedics;  Laterality: Left;  . IR FLUORO GUIDE PORT INSERTION RIGHT  07/03/2017  . IR US GUIDE VASC ACCESS RIGHT  07/03/2017  . LUNG CANCER SURGERY Left 12/2014   Wedge Resection   . MULTIPLE TOOTH EXTRACTIONS    . Status post TURP    . TONSILLECTOMY      REVIEW OF SYSTEMS:  A comprehensive review of systems was negative except for: Constitutional: positive for fatigue Integument/breast: positive for rash   PHYSICAL EXAMINATION: General appearance: alert, cooperative and no distress Head: Normocephalic, without obvious abnormality, atraumatic Neck: no adenopathy, no JVD, supple, symmetrical, trachea midline and thyroid not enlarged, symmetric, no tenderness/mass/nodules Lymph nodes: Cervical, supraclavicular, and axillary nodes normal. Resp: clear to auscultation bilaterally Back: symmetric, no curvature. ROM normal. No CVA tenderness. Cardio: regular rate and rhythm, S1, S2 normal, no murmur, click, rub or gallop GI: soft, non-tender; bowel sounds normal; no masses,  no organomegaly Extremities: extremities normal, atraumatic, no cyanosis or edema  ECOG PERFORMANCE STATUS: 1 - Symptomatic but completely ambulatory  Blood pressure 109/80,  pulse 71, temperature 98.5 F (36.9 C), temperature source Oral, resp. rate 12, height 6' 1"  (1.854 m), weight 241 lb 3.2 oz (109.4 kg), SpO2 97 %.  LABORATORY DATA: Lab Results  Component Value Date   WBC 5.5 02/06/2018   HGB 14.2 02/06/2018   HCT 40.7 02/06/2018   MCV 95.1 02/06/2018   PLT 163 02/06/2018      Chemistry      Component Value Date/Time   NA 142 01/16/2018 0824   NA 136 04/04/2017 1141   K 3.9 01/16/2018 0824   K 4.4 04/04/2017 1141   CL 107 01/16/2018 0824   CO2 24 01/16/2018 0824   CO2 24 04/04/2017 1141   BUN 22 01/16/2018 0824   BUN 21.1 04/04/2017 1141   CREATININE 0.84 01/16/2018 0824   CREATININE 1.1 04/04/2017 1141      Component Value Date/Time   CALCIUM 9.1 01/16/2018 0824    CALCIUM 9.1 04/04/2017 1141   ALKPHOS 96 01/16/2018 0824   ALKPHOS 89 04/04/2017 1141   AST 20 01/16/2018 0824   AST 17 04/04/2017 1141   ALT 19 01/16/2018 0824   ALT 18 04/04/2017 1141   BILITOT 0.9 01/16/2018 0824   BILITOT 0.67 04/04/2017 1141       RADIOGRAPHIC STUDIES: No results found.  ASSESSMENT AND PLAN: This is a 67 years old white male with metastatic non-small cell lung cancer, adenocarcinoma with no actionable mutations and PDL 1 expression of 5% that was initially diagnosed as stage IIIa non-small cell lung cancer, adenocarcinoma status post left lower lobectomy with lymph node dissection followed by a course of concurrent chemoradiation completed in January 2016.  The patient had evidence for disease metastasis in October 2018 with metastatic disease to the left femur as well as left supraclavicular and right paratracheal lymph nodes. The patient is currently on systemic chemotherapy initially was with carboplatin, Alimta and Keytruda.  Carboplatin was discontinued secondary to hypersensitivity reaction starting from cycle #2.   He was also treated with 3 cycles of maintenance Alimta and Ketruda (pembrolizumab) but Alimta was discontinued secondary to intolerance. He is currently undergoing treatment with maintenance Keytruda as a single agent status post 11 cycles.  The patient has been tolerating his treatment well with no concerning adverse effect except for mild skin rash on the face. I recommended for him to proceed with cycle #12 today as scheduled. I will see him back for follow-up visit in 3 weeks for evaluation before starting the next cycle of his treatment. He will have repeat CT scan of the chest, abdomen and pelvis before his next visit. For the skin rash, I recommended for the patient to apply over-the-counter hydrocortisone cream. He was advised to call immediately if he has any concerning symptoms in the interval. The patient voices understanding of current  disease status and treatment options and is in agreement with the current care plan. All questions were answered. The patient knows to call the clinic with any problems, questions or concerns. We can certainly see the patient much sooner if necessary.  Disclaimer: This note was dictated with voice recognition software. Similar sounding words can inadvertently be transcribed and may not be corrected upon review.

## 2018-02-06 NOTE — Patient Instructions (Signed)
Implanted Port Home Guide An implanted port is a type of central line that is placed under the skin. Central lines are used to provide IV access when treatment or nutrition needs to be given through a person's veins. Implanted ports are used for long-term IV access. An implanted port may be placed because:  You need IV medicine that would be irritating to the small veins in your hands or arms.  You need long-term IV medicines, such as antibiotics.  You need IV nutrition for a long period.  You need frequent blood draws for lab tests.  You need dialysis.  Implanted ports are usually placed in the chest area, but they can also be placed in the upper arm, the abdomen, or the leg. An implanted port has two main parts:  Reservoir. The reservoir is round and will appear as a small, raised area under your skin. The reservoir is the part where a needle is inserted to give medicines or draw blood.  Catheter. The catheter is a thin, flexible tube that extends from the reservoir. The catheter is placed into a large vein. Medicine that is inserted into the reservoir goes into the catheter and then into the vein.  How will I care for my incision site? Do not get the incision site wet. Bathe or shower as directed by your health care provider. How is my port accessed? Special steps must be taken to access the port:  Before the port is accessed, a numbing cream can be placed on the skin. This helps numb the skin over the port site.  Your health care provider uses a sterile technique to access the port. ? Your health care provider must put on a mask and sterile gloves. ? The skin over your port is cleaned carefully with an antiseptic and allowed to dry. ? The port is gently pinched between sterile gloves, and a needle is inserted into the port.  Only "non-coring" port needles should be used to access the port. Once the port is accessed, a blood return should be checked. This helps ensure that the port  is in the vein and is not clogged.  If your port needs to remain accessed for a constant infusion, a clear (transparent) bandage will be placed over the needle site. The bandage and needle will need to be changed every week, or as directed by your health care provider.  Keep the bandage covering the needle clean and dry. Do not get it wet. Follow your health care provider's instructions on how to take a shower or bath while the port is accessed.  If your port does not need to stay accessed, no bandage is needed over the port.  What is flushing? Flushing helps keep the port from getting clogged. Follow your health care provider's instructions on how and when to flush the port. Ports are usually flushed with saline solution or a medicine called heparin. The need for flushing will depend on how the port is used.  If the port is used for intermittent medicines or blood draws, the port will need to be flushed: ? After medicines have been given. ? After blood has been drawn. ? As part of routine maintenance.  If a constant infusion is running, the port may not need to be flushed.  How long will my port stay implanted? The port can stay in for as long as your health care provider thinks it is needed. When it is time for the port to come out, surgery will be   done to remove it. The procedure is similar to the one performed when the port was put in. When should I seek immediate medical care? When you have an implanted port, you should seek immediate medical care if:  You notice a bad smell coming from the incision site.  You have swelling, redness, or drainage at the incision site.  You have more swelling or pain at the port site or the surrounding area.  You have a fever that is not controlled with medicine.  This information is not intended to replace advice given to you by your health care provider. Make sure you discuss any questions you have with your health care provider. Document  Released: 03/19/2005 Document Revised: 08/25/2015 Document Reviewed: 11/24/2012 [employer] Interactive Patient Education  2017 [employer]  

## 2018-02-06 NOTE — Patient Instructions (Signed)
[facility] Discharge Instructions for Patients Receiving Chemotherapy  Today you received the following chemotherapy agents :  pembrolizumab Beryle Flock).  To help prevent nausea and vomiting after your treatment, we encourage you to take your nausea medication as prescribed.   If you develop nausea and vomiting that is not controlled by your nausea medication, call the clinic.   BELOW ARE SYMPTOMS THAT SHOULD BE REPORTED IMMEDIATELY:  *FEVER GREATER THAN 100.5 F  *CHILLS WITH OR WITHOUT FEVER  NAUSEA AND VOMITING THAT IS NOT CONTROLLED WITH YOUR NAUSEA MEDICATION  *UNUSUAL SHORTNESS OF BREATH  *UNUSUAL BRUISING OR BLEEDING  TENDERNESS IN MOUTH AND THROAT WITH OR WITHOUT PRESENCE OF ULCERS  *URINARY PROBLEMS  *BOWEL PROBLEMS  UNUSUAL RASH Items with * indicate a potential emergency and should be followed up as soon as possible.  Feel free to call the clinic should you have any questions or concerns. The clinic phone number is [phone].  Please show the [facility] at check-in to the Emergency Department and triage nurse.

## 2018-02-11 ENCOUNTER — Encounter (HOSPITAL_COMMUNITY): Payer: Self-pay

## 2018-02-11 ENCOUNTER — Ambulatory Visit (HOSPITAL_COMMUNITY)
Admission: RE | Admit: 2018-02-11 | Discharge: 2018-02-11 | Disposition: A | Discharge status: Home / Self Care | Payer: Medicare HMO | Source: Ambulatory Visit | Attending: Oncology | Admitting: Oncology

## 2018-02-11 DIAGNOSIS — J439 Emphysema, unspecified: Secondary | ICD-10-CM | POA: Insufficient documentation

## 2018-02-11 DIAGNOSIS — I700 Atherosclerosis of aorta: Secondary | ICD-10-CM | POA: Diagnosis not present

## 2018-02-11 DIAGNOSIS — C3492 Malignant neoplasm of unspecified part of left bronchus or lung: Secondary | ICD-10-CM | POA: Insufficient documentation

## 2018-02-11 DIAGNOSIS — K8020 Calculus of gallbladder without cholecystitis without obstruction: Secondary | ICD-10-CM | POA: Insufficient documentation

## 2018-02-11 MED ORDER — IOHEXOL 300 MG/ML  SOLN
100.0000 mL | Freq: Once | INTRAMUSCULAR | Status: AC | PRN
  Administered 2018-02-11: 100 mL via INTRAVENOUS

## 2018-02-11 MED ORDER — SODIUM CHLORIDE (PF) 0.9 % IJ SOLN
INTRAMUSCULAR | Status: AC
  Filled 2018-02-11: qty 50

## 2018-02-26 ENCOUNTER — Inpatient Hospital Stay: Payer: Medicare HMO

## 2018-02-26 ENCOUNTER — Inpatient Hospital Stay (HOSPITAL_BASED_OUTPATIENT_CLINIC_OR_DEPARTMENT_OTHER): Payer: Medicare HMO | Admitting: Internal Medicine

## 2018-02-26 ENCOUNTER — Telehealth: Payer: Self-pay | Admitting: Internal Medicine

## 2018-02-26 ENCOUNTER — Encounter: Payer: Self-pay | Admitting: Internal Medicine

## 2018-02-26 VITALS — BP 116/75 | HR 81 | Temp 97.9°F | Resp 18 | Ht 73.0 in | Wt 238.1 lb

## 2018-02-26 DIAGNOSIS — Z5112 Encounter for antineoplastic immunotherapy: Secondary | ICD-10-CM | POA: Diagnosis not present

## 2018-02-26 DIAGNOSIS — E039 Hypothyroidism, unspecified: Secondary | ICD-10-CM

## 2018-02-26 DIAGNOSIS — Z902 Acquired absence of lung [part of]: Secondary | ICD-10-CM | POA: Diagnosis not present

## 2018-02-26 DIAGNOSIS — C7951 Secondary malignant neoplasm of bone: Secondary | ICD-10-CM

## 2018-02-26 DIAGNOSIS — Z79899 Other long term (current) drug therapy: Secondary | ICD-10-CM

## 2018-02-26 DIAGNOSIS — C3492 Malignant neoplasm of unspecified part of left bronchus or lung: Secondary | ICD-10-CM

## 2018-02-26 DIAGNOSIS — C3432 Malignant neoplasm of lower lobe, left bronchus or lung: Secondary | ICD-10-CM

## 2018-02-26 DIAGNOSIS — Z95828 Presence of other vascular implants and grafts: Secondary | ICD-10-CM

## 2018-02-26 LAB — CMP (CANCER CENTER ONLY)
ALK PHOS: 98 U/L (ref 38–126)
ALT: 21 U/L (ref 0–44)
AST: 23 U/L (ref 15–41)
Albumin: 4 g/dL (ref 3.5–5.0)
Anion gap: 10 (ref 5–15)
BILIRUBIN TOTAL: 0.9 mg/dL (ref 0.3–1.2)
BUN: 15 mg/dL (ref 8–23)
CALCIUM: 9.4 mg/dL (ref 8.9–10.3)
CO2: 25 mmol/L (ref 22–32)
CREATININE: 0.85 mg/dL (ref 0.61–1.24)
Chloride: 106 mmol/L (ref 98–111)
GFR, Est AFR Am: >60 mL/min (ref >60)
Glucose, Bld: 112 mg/dL — ABNORMAL HIGH (ref 70–99)
POTASSIUM: 4.5 mmol/L (ref 3.5–5.1)
Sodium: 141 mmol/L (ref 135–145)
TOTAL PROTEIN: 6.5 g/dL (ref 6.5–8.1)

## 2018-02-26 LAB — CBC WITH DIFFERENTIAL (CANCER CENTER ONLY)
Abs Immature Granulocytes: 0.02 10*3/uL (ref 0.00–0.07)
BASOS PCT: 0 %
Basophils Absolute: 0 10*3/uL (ref 0.0–0.1)
EOS ABS: 0.1 10*3/uL (ref 0.0–0.5)
EOS PCT: 1 %
HCT: 43.8 % (ref 39.0–52.0)
Hemoglobin: 15.7 g/dL (ref 13.0–17.0)
Immature Granulocytes: 0 %
Lymphocytes Relative: 15 %
Lymphs Abs: 1.2 10*3/uL (ref 0.7–4.0)
MCH: 33.6 pg (ref 26.0–34.0)
MCHC: 35.8 g/dL (ref 30.0–36.0)
MCV: 93.8 fL (ref 80.0–100.0)
Monocytes Absolute: 0.5 10*3/uL (ref 0.1–1.0)
Monocytes Relative: 7 %
NEUTROS ABS: 5.8 10*3/uL (ref 1.7–7.7)
Neutrophils Relative %: 77 %
PLATELETS: 173 10*3/uL (ref 150–400)
RBC: 4.67 MIL/uL (ref 4.22–5.81)
RDW: 13.3 % (ref 11.5–15.5)
WBC Count: 7.6 10*3/uL (ref 4.0–10.5)
nRBC: 0 % (ref 0.0–0.2)

## 2018-02-26 LAB — TSH: TSH: 0.408 u[IU]/mL (ref 0.320–4.118)

## 2018-02-26 MED ORDER — SODIUM CHLORIDE 0.9% FLUSH
10.0000 mL | INTRAVENOUS | Status: DC | PRN
  Administered 2018-02-26: 10 mL
  Filled 2018-02-26: qty 10

## 2018-02-26 MED ORDER — HEPARIN SOD (PORK) LOCK FLUSH 100 UNIT/ML IV SOLN
500.0000 [IU] | Freq: Once | INTRAVENOUS | Status: AC | PRN
  Administered 2018-02-26: 500 [IU]
  Filled 2018-02-26: qty 5

## 2018-02-26 MED ORDER — SODIUM CHLORIDE 0.9 % IV SOLN
Freq: Once | INTRAVENOUS | Status: AC
  Administered 2018-02-26: 11:00:00 via INTRAVENOUS
  Filled 2018-02-26: qty 250

## 2018-02-26 MED ORDER — SODIUM CHLORIDE 0.9 % IV SOLN
200.0000 mg | Freq: Once | INTRAVENOUS | Status: AC
  Administered 2018-02-26: 200 mg via INTRAVENOUS
  Filled 2018-02-26: qty 8

## 2018-02-26 NOTE — Progress Notes (Signed)
Victor Huber [phone]) [phone]   Fax:([phone]  OFFICE PROGRESS NOTE  [name], MD [address], [place] [address]  DIAGNOSIS: Metastatic non-small cell lung cancer initially diagnosed as stage IIIA (T2a, N2, M0) non-small cell lung cancer, poorly differentiated adenocarcinoma presented with left lower lobe lung mass in addition to mediastinal lymphadenopathy.  The patient was diagnosed with metastatic disease involving the left femur as well as left supraclavicular nodal metastases and right paratracheal lymphadenopathy in October 2018.  Biomarker Findings Microsatellite Status - MS-Stable Tumor Mutational Burden - TMB-Low (3 Muts/Mb) Genomic Findings For a complete list of the genes assayed, please refer to the Appendix. STK11 P273f*6 [id number] site 1704+8G>QDAXX E374* MLL2 [id number] NBN K23369f NOTCH2 [id number] splice site [id number]>KDisease relevant genes with no reportable alterations: EGFR, KRAS, ALK, BRAF, MET, RET, ERBB2, ROS1   PDL1 expression 5%  PRIOR THERAPY: 1) status post wedge resection of the left lower lobe lung mass as well as AP window lymph node dissection but there was residual metastatic mediastinal lymphadenopathy that could not be resected. 2) a course of concurrent chemorad[place] carbopl[place]  3) status post palliative radiotherapy to the left femur metastatic bone disease. 4)  Systemic chemotherapy with carboplatin for AUC of 5, Alimta 500 mg/M2 and Keytruda 200 mg IV every 3 weeks.  First dose March 07, 2017.  Carboplatin was discontinued during cycle #2 secondary to hypersensitivity reaction. 5) status post 2 cycles of maintenance treatment with Alimta and Ketruda (pembrolizumab).  Alimta was discontinued secondary to intolerance.  CURRENT THERAPY: Maintenance treatment with single agent Ketruda (pembrolizumab) status post 12  cycles.  INTERVAL HISTORY: Victor Huber 67o. male returns to the clinic today for follow-up visit accompanied by his son.  The patient is feeling fine today with no concerning complaints except for right shoulder pain after fall in September.  He denied having any current chest pain, shortness of breath, cough or hemoptysis.  He denied having any fever or chills.  He has no nausea, vomiting, diarrhea or constipation.  He has no recent weight loss or night sweats.  He continues to tolerate his treatment with Keytruda fairly well.  The patient is here today for evaluation before starting cycle #13.  MEDICAL HISTORY: Past Medical History:  Diagnosis Date  . Adenocarcinoma of left lung, stage 3 ([facility]/16/2018  . Arthritis   . Atrial fibrillation ([place]/16/2018  . Atrial fibrillation ([facility] . COPD (chronic obstructive pulmonary disease) ([facility]/16/2018  . History of chemotherapy   . History of radiation therapy   . Hyperlipidemia   . Hypothyroid 08/15/2016  . Longstanding persistent atrial fibrillation 08/29/2016  . Pathologic fracture    left femur  . Pneumonitis   . S/P TURP 08/15/2016  . Wears glasses   . Wears hearing aid in both ears     ALLERGIES:  is allergic to carboplatin and flecainide.  MEDICATIONS:  Current Outpatient Medications  Medication Sig Dispense Refill  . apixaban (ELIQUIS) 5 MG TABS tablet Take 1 tablet (5 mg total) by mouth 2 (two) times daily. 180 tablet 3  . atenolol (TENORMIN) 50 MG tablet Take 1 tablet (50 mg total) by mouth daily. 90 tablet 3  . atorvastatin (LIPITOR) 40 MG tablet Take 40 mg by mouth daily.    . baclofen (LIORESAL) 10 MG tablet Take 1 tablet (10 mg total) by mouth 3 (three) times  daily. As needed for muscle spasm (Patient not taking: Reported on 01/16/2018) 50 tablet 0  . citalopram (CELEXA) 40 MG tablet Take 40 mg by mouth daily.    . clindamycin (CLINDAGEL) 1 % gel Apply topically 2 (two) times daily as needed. For rash on face  (Patient not taking: Reported on 02/06/2018) 60 g 2  . ibuprofen (ADVIL,MOTRIN) 400 MG tablet Take 400 mg by mouth every 4 (four) hours as needed for moderate pain.    Marland Kitchen levothyroxine (SYNTHROID, LEVOTHROID) 150 MCG tablet Take 150 mcg by mouth daily.    Marland Kitchen LORazepam (ATIVAN) 0.5 MG tablet Take 1 tablet (0.5 mg total) by mouth every 8 (eight) hours as needed (nausea). (Patient not taking: Reported on 01/16/2018) 30 tablet 0  . ondansetron (ZOFRAN) 4 MG tablet Take 1 tablet (4 mg total) by mouth every 8 (eight) hours as needed for nausea or vomiting. (Patient not taking: Reported on 01/16/2018) 30 tablet 0  . oxyCODONE (ROXICODONE) 5 MG immediate release tablet Take 1-2 tablets (5-10 mg total) by mouth every 4 (four) hours as needed for severe pain. 50 tablet 0  . prochlorperazine (COMPAZINE) 10 MG tablet Take 1 tablet (10 mg total) by mouth every 6 (six) hours as needed for nausea or vomiting. (Patient not taking: Reported on 01/16/2018) 30 tablet 0  . sennosides-docusate sodium (SENOKOT-S) 8.6-50 MG tablet Take 2 tablets by mouth daily. (Patient not taking: Reported on 01/16/2018) 30 tablet 1  . tamsulosin (FLOMAX) 0.4 MG CAPS capsule Take 0.4 mg daily by mouth.     . zolpidem (AMBIEN) 5 MG tablet Take 5 mg by mouth at bedtime as needed for sleep.     No current facility-administered medications for this visit.     SURGICAL HISTORY:  Past Surgical History:  Procedure Laterality Date  . BRONCHOSCOPY  10/2014  . CARDIAC CATHETERIZATION     05/07/12  . CARDIOVERSION     x2  . COLONOSCOPY    . DG BIOPSY LUNG Left 10/2014   FNA - Adenocarcinoma   . FEMUR IM NAIL Left 02/19/2017  . FEMUR IM NAIL Left 02/19/2017   Procedure: INTRAMEDULLARY (IM) NAIL FEMORAL;  Surgeon: Marchia Bond, MD;  Location: [facility];  Service: Orthopedics;  Laterality: Left;  . IR FLUORO GUIDE PORT INSERTION RIGHT  07/03/2017  . IR US GUIDE VASC ACCESS RIGHT  07/03/2017  . LUNG CANCER SURGERY Left 12/2014   Wedge Resection   .  MULTIPLE TOOTH EXTRACTIONS    . Status post TURP    . TONSILLECTOMY      REVIEW OF SYSTEMS:  A comprehensive review of systems was negative except for: Musculoskeletal: positive for arthralgias   PHYSICAL EXAMINATION: General appearance: alert, cooperative and no distress Head: Normocephalic, without obvious abnormality, atraumatic Neck: no adenopathy, no JVD, supple, symmetrical, trachea midline and thyroid not enlarged, symmetric, no tenderness/mass/nodules Lymph nodes: Cervical, supraclavicular, and axillary nodes normal. Resp: clear to auscultation bilaterally Back: symmetric, no curvature. ROM normal. No CVA tenderness. Cardio: regular rate and rhythm, S1, S2 normal, no murmur, click, rub or gallop GI: soft, non-tender; bowel sounds normal; no masses,  no organomegaly Extremities: extremities normal, atraumatic, no cyanosis or edema  ECOG PERFORMANCE STATUS: 1 - Symptomatic but completely ambulatory  Blood pressure 116/75, pulse 81, temperature 97.9 F (36.6 C), temperature source Oral, resp. rate 18, height 6' 1"  (1.854 m), weight 238 lb 1.6 oz (108 kg), SpO2 96 %.  LABORATORY DATA: Lab Results  Component Value Date   WBC 7.6  02/26/2018   HGB 15.7 02/26/2018   HCT 43.8 02/26/2018   MCV 93.8 02/26/2018   PLT 173 02/26/2018      Chemistry      Component Value Date/Time   NA 141 02/26/2018 0907   NA 136 04/04/2017 1141   K 4.5 02/26/2018 0907   K 4.4 04/04/2017 1141   CL 106 02/26/2018 0907   CO2 25 02/26/2018 0907   CO2 24 04/04/2017 1141   BUN 15 02/26/2018 0907   BUN 21.1 04/04/2017 1141   CREATININE 0.85 02/26/2018 0907   CREATININE 1.1 04/04/2017 1141      Component Value Date/Time   CALCIUM 9.4 02/26/2018 0907   CALCIUM 9.1 04/04/2017 1141   ALKPHOS 98 02/26/2018 0907   ALKPHOS 89 04/04/2017 1141   AST 23 02/26/2018 0907   AST 17 04/04/2017 1141   ALT 21 02/26/2018 0907   ALT 18 04/04/2017 1141   BILITOT 0.9 02/26/2018 0907   BILITOT 0.67 04/04/2017  1141       RADIOGRAPHIC STUDIES: Ct Chest W Contrast  Result Date: 02/11/2018 CLINICAL DATA:  Left lung cancer status post left lung wedge resection in 2016. EXAM: CT CHEST, ABDOMEN, AND PELVIS WITH CONTRAST TECHNIQUE: Multidetector CT imaging of the chest, abdomen and pelvis was performed following the standard protocol during bolus administration of intravenous contrast. CONTRAST:  174m OMNIPAQUE IOHEXOL 300 MG/ML  SOLN COMPARISON:  12/04/2017 FINDINGS: CT CHEST FINDINGS Cardiovascular: Heart size upper normal. Small pericardial effusion is stable. Coronary artery calcification is evident. Atherosclerotic calcification is noted in the wall of the thoracic aorta. Pulmonary arterial enlargement again noted. Right Port-A-Cath tip is positioned at the SVC/RA junction. Mediastinum/Nodes: 11 mm short axis right paratracheal lymph node is stable. No change 13 mm short axis subcarinal lymph node. The esophagus has normal imaging features. No right hilar lymphadenopathy. Post treatment changes in the inferior left hilum are stable. There is no axillary lymphadenopathy. Lungs/Pleura: The central tracheobronchial airways are patent. Centrilobular and paraseptal emphysema evident with bullous change in the apices, right greater than left. Post treatment changes infrahilar left lower lobe are similar to prior. Interval evolution of the ill-defined irregular period bronchovascular opacities described previously. Some of these appear decreased while others appear enlarged and new lesions are evident on the current study. 15 mm anterior right upper lobe lesion on the prior study has nearly resolved. 1.5 x 2.4 cm lesion in the right middle lobe (93/5) shows interval progression. 11 mm paraspinal right lower lobe lesion (79/5) is new in the interval. Some areas demonstrate linear/curvilinear lung involvement. No pleural effusion. Musculoskeletal: No worrisome lytic or sclerotic osseous abnormality. CT ABDOMEN PELVIS  FINDINGS Hepatobiliary: Tiny hypodensity in the dome of the left liver is stable. Liver otherwise unremarkable. Calcified gallstones again noted. No intrahepatic or extrahepatic biliary dilation. Pancreas: No focal mass lesion. No dilatation of the main duct. No intraparenchymal cyst. No peripancreatic edema. Spleen: No splenomegaly. No focal mass lesion. Adrenals/Urinary Tract: Left adrenal gland unremarkable. 11 mm right adrenal nodule is unchanged. 8 mm exophytic low-density lesion interpolar right kidney is stable and likely a cyst. Similar 12 mm exophytic lesion interpolar left kidney is also stable. 2.3 cm cyst lower pole left kidney is unchanged. No evidence for hydroureter. The urinary bladder appears normal for the degree of distention. Stomach/Bowel: Stomach is nondistended. No gastric wall thickening. No evidence of outlet obstruction. Duodenum is normally positioned as is the ligament of Treitz. No small bowel wall thickening. No small bowel dilatation. The terminal  ileum is normal. The appendix is normal. No gross colonic mass. No colonic wall thickening. Diverticular changes are noted in the left colon without evidence of diverticulitis. Vascular/Lymphatic: There is abdominal aortic atherosclerosis without aneurysm. Small lymph nodes in the gastrohepatic ligament are stable. There is no gastrohepatic or hepatoduodenal ligament lymphadenopathy. No intraperitoneal or retroperitoneal lymphadenopathy. No pelvic sidewall lymphadenopathy. Reproductive: Unremarkable. Other: No intraperitoneal free fluid. Musculoskeletal: ORIF proximal left femur. No worrisome lytic or sclerotic osseous abnormality. Bilateral groin hernias contain only fat. IMPRESSION: 1. Interval evolution of the irregular peribronchovascular and nodular consolidative changes described on the prior study. While some areas appear improved, other areas of progressed. These changes could be infectious/inflammatory in etiology although metastatic  disease and drug toxicity not excluded. 2. Enlargement of the pulmonary arteries suggests pulmonary arterial hypertension. 3. No evidence for metastatic disease in the abdomen/pelvis. 4. Cholelithiasis. 5.  Aortic Atherosclerois (ICD10-170.0) 6.  Emphysema. ([id number]) Electronically Signed   By: Misty Stanley M.D.   On: 02/11/2018 14:39   Ct Abdomen Pelvis W Contrast  Result Date: 02/11/2018 CLINICAL DATA:  Left lung cancer status post left lung wedge resection in 2016. EXAM: CT CHEST, ABDOMEN, AND PELVIS WITH CONTRAST TECHNIQUE: Multidetector CT imaging of the chest, abdomen and pelvis was performed following the standard protocol during bolus administration of intravenous contrast. CONTRAST:  125m OMNIPAQUE IOHEXOL 300 MG/ML  SOLN COMPARISON:  12/04/2017 FINDINGS: CT CHEST FINDINGS Cardiovascular: Heart size upper normal. Small pericardial effusion is stable. Coronary artery calcification is evident. Atherosclerotic calcification is noted in the wall of the thoracic aorta. Pulmonary arterial enlargement again noted. Right Port-A-Cath tip is positioned at the SVC/RA junction. Mediastinum/Nodes: 11 mm short axis right paratracheal lymph node is stable. No change 13 mm short axis subcarinal lymph node. The esophagus has normal imaging features. No right hilar lymphadenopathy. Post treatment changes in the inferior left hilum are stable. There is no axillary lymphadenopathy. Lungs/Pleura: The central tracheobronchial airways are patent. Centrilobular and paraseptal emphysema evident with bullous change in the apices, right greater than left. Post treatment changes infrahilar left lower lobe are similar to prior. Interval evolution of the ill-defined irregular period bronchovascular opacities described previously. Some of these appear decreased while others appear enlarged and new lesions are evident on the current study. 15 mm anterior right upper lobe lesion on the prior study has nearly resolved. 1.5 x 2.4  cm lesion in the right middle lobe (93/5) shows interval progression. 11 mm paraspinal right lower lobe lesion (79/5) is new in the interval. Some areas demonstrate linear/curvilinear lung involvement. No pleural effusion. Musculoskeletal: No worrisome lytic or sclerotic osseous abnormality. CT ABDOMEN PELVIS FINDINGS Hepatobiliary: Tiny hypodensity in the dome of the left liver is stable. Liver otherwise unremarkable. Calcified gallstones again noted. No intrahepatic or extrahepatic biliary dilation. Pancreas: No focal mass lesion. No dilatation of the main duct. No intraparenchymal cyst. No peripancreatic edema. Spleen: No splenomegaly. No focal mass lesion. Adrenals/Urinary Tract: Left adrenal gland unremarkable. 11 mm right adrenal nodule is unchanged. 8 mm exophytic low-density lesion interpolar right kidney is stable and likely a cyst. Similar 12 mm exophytic lesion interpolar left kidney is also stable. 2.3 cm cyst lower pole left kidney is unchanged. No evidence for hydroureter. The urinary bladder appears normal for the degree of distention. Stomach/Bowel: Stomach is nondistended. No gastric wall thickening. No evidence of outlet obstruction. Duodenum is normally positioned as is the ligament of Treitz. No small bowel wall thickening. No small bowel dilatation. The terminal ileum is normal.  The appendix is normal. No gross colonic mass. No colonic wall thickening. Diverticular changes are noted in the left colon without evidence of diverticulitis. Vascular/Lymphatic: There is abdominal aortic atherosclerosis without aneurysm. Small lymph nodes in the gastrohepatic ligament are stable. There is no gastrohepatic or hepatoduodenal ligament lymphadenopathy. No intraperitoneal or retroperitoneal lymphadenopathy. No pelvic sidewall lymphadenopathy. Reproductive: Unremarkable. Other: No intraperitoneal free fluid. Musculoskeletal: ORIF proximal left femur. No worrisome lytic or sclerotic osseous abnormality.  Bilateral groin hernias contain only fat. IMPRESSION: 1. Interval evolution of the irregular peribronchovascular and nodular consolidative changes described on the prior study. While some areas appear improved, other areas of progressed. These changes could be infectious/inflammatory in etiology although metastatic disease and drug toxicity not excluded. 2. Enlargement of the pulmonary arteries suggests pulmonary arterial hypertension. 3. No evidence for metastatic disease in the abdomen/pelvis. 4. Cholelithiasis. 5.  Aortic Atherosclerois (ICD10-170.0) 6.  Emphysema. ([id number]) Electronically Signed   By: Misty Stanley M.D.   On: 02/11/2018 14:39    ASSESSMENT AND PLAN: This is a 67 years old white male with metastatic non-small cell lung cancer, adenocarcinoma with no actionable mutations and PDL 1 expression of 5% that was initially diagnosed as stage IIIa non-small cell lung cancer, adenocarcinoma status post left lower lobectomy with lymph node dissection followed by a course of concurrent chemoradiation completed in January 2016.  The patient had evidence for disease metastasis in October 2018 with metastatic disease to the left femur as well as left supraclavicular and right paratracheal lymph nodes. The patient is currently on systemic chemotherapy initially was with carboplatin, Alimta and Keytruda.  Carboplatin was discontinued secondary to hypersensitivity reaction starting from cycle #2.   He was also treated with 3 cycles of maintenance Alimta and Ketruda (pembrolizumab) but Alimta was discontinued secondary to intolerance. He is currently undergoing treatment with maintenance Keytruda as a single agent status post 12 cycles.  The patient has been tolerating this treatment well with no concerning adverse effects. He had repeat CT scan of the chest, abdomen and pelvis performed 2 weeks ago.  I personally and independently reviewed the scans and discussed the results with the patient and his  son.  His a scan showed no concerning findings for disease progression. I recommended for him to proceed with cycle #13 today as scheduled. The patient will come back for follow-up visit in 3 weeks for evaluation before starting cycle #14. For the right shoulder pain, I recommended for the patient to see his orthopedic surgeon for evaluation. He was advised to call immediately if he has any concerning symptoms in the interval. The patient voices understanding of current disease status and treatment options and is in agreement with the current care plan. All questions were answered. The patient knows to call the clinic with any problems, questions or concerns. We can certainly see the patient much sooner if necessary.  Disclaimer: This note was dictated with voice recognition software. Similar sounding words can inadvertently be transcribed and may not be corrected upon review.

## 2018-02-26 NOTE — Patient Instructions (Signed)
[facility] Discharge Instructions for Patients Receiving Chemotherapy  Today you received the following chemotherapy agents :  pembrolizumab Beryle Flock).  To help prevent nausea and vomiting after your treatment, we encourage you to take your nausea medication as prescribed.   If you develop nausea and vomiting that is not controlled by your nausea medication, call the clinic.   BELOW ARE SYMPTOMS THAT SHOULD BE REPORTED IMMEDIATELY:  *FEVER GREATER THAN 100.5 F  *CHILLS WITH OR WITHOUT FEVER  NAUSEA AND VOMITING THAT IS NOT CONTROLLED WITH YOUR NAUSEA MEDICATION  *UNUSUAL SHORTNESS OF BREATH  *UNUSUAL BRUISING OR BLEEDING  TENDERNESS IN MOUTH AND THROAT WITH OR WITHOUT PRESENCE OF ULCERS  *URINARY PROBLEMS  *BOWEL PROBLEMS  UNUSUAL RASH Items with * indicate a potential emergency and should be followed up as soon as possible.  Feel free to call the clinic should you have any questions or concerns. The clinic phone number is [phone].  Please show the [facility] at check-in to the Emergency Department and triage nurse.

## 2018-02-26 NOTE — Telephone Encounter (Signed)
3 cycles already scheduled per 11/27 los - no additional appts added.

## 2018-03-20 ENCOUNTER — Other Ambulatory Visit: Payer: Self-pay

## 2018-03-20 ENCOUNTER — Encounter: Payer: Self-pay | Admitting: Internal Medicine

## 2018-03-20 ENCOUNTER — Inpatient Hospital Stay (HOSPITAL_BASED_OUTPATIENT_CLINIC_OR_DEPARTMENT_OTHER): Payer: Medicare HMO | Admitting: Internal Medicine

## 2018-03-20 ENCOUNTER — Inpatient Hospital Stay: Payer: Medicare HMO | Attending: Internal Medicine

## 2018-03-20 ENCOUNTER — Inpatient Hospital Stay: Payer: Medicare HMO

## 2018-03-20 VITALS — BP 120/91 | HR 97 | Temp 97.7°F | Resp 18 | Ht 73.0 in | Wt 237.0 lb

## 2018-03-20 DIAGNOSIS — Z5112 Encounter for antineoplastic immunotherapy: Secondary | ICD-10-CM

## 2018-03-20 DIAGNOSIS — I4891 Unspecified atrial fibrillation: Secondary | ICD-10-CM | POA: Diagnosis not present

## 2018-03-20 DIAGNOSIS — C3432 Malignant neoplasm of lower lobe, left bronchus or lung: Secondary | ICD-10-CM

## 2018-03-20 DIAGNOSIS — J449 Chronic obstructive pulmonary disease, unspecified: Secondary | ICD-10-CM | POA: Insufficient documentation

## 2018-03-20 DIAGNOSIS — Z902 Acquired absence of lung [part of]: Secondary | ICD-10-CM | POA: Insufficient documentation

## 2018-03-20 DIAGNOSIS — Z79899 Other long term (current) drug therapy: Secondary | ICD-10-CM | POA: Diagnosis not present

## 2018-03-20 DIAGNOSIS — M1990 Unspecified osteoarthritis, unspecified site: Secondary | ICD-10-CM | POA: Insufficient documentation

## 2018-03-20 DIAGNOSIS — E039 Hypothyroidism, unspecified: Secondary | ICD-10-CM | POA: Insufficient documentation

## 2018-03-20 DIAGNOSIS — C7951 Secondary malignant neoplasm of bone: Secondary | ICD-10-CM | POA: Diagnosis not present

## 2018-03-20 DIAGNOSIS — C3492 Malignant neoplasm of unspecified part of left bronchus or lung: Secondary | ICD-10-CM

## 2018-03-20 DIAGNOSIS — Z95828 Presence of other vascular implants and grafts: Secondary | ICD-10-CM

## 2018-03-20 DIAGNOSIS — E785 Hyperlipidemia, unspecified: Secondary | ICD-10-CM | POA: Insufficient documentation

## 2018-03-20 DIAGNOSIS — Z9221 Personal history of antineoplastic chemotherapy: Secondary | ICD-10-CM | POA: Diagnosis not present

## 2018-03-20 DIAGNOSIS — Z7901 Long term (current) use of anticoagulants: Secondary | ICD-10-CM | POA: Insufficient documentation

## 2018-03-20 LAB — CBC WITH DIFFERENTIAL (CANCER CENTER ONLY)
Abs Immature Granulocytes: 0.03 10*3/uL (ref 0.00–0.07)
BASOS ABS: 0 10*3/uL (ref 0.0–0.1)
Basophils Relative: 0 %
Eosinophils Absolute: 0.1 10*3/uL (ref 0.0–0.5)
Eosinophils Relative: 1 %
HCT: 44.4 % (ref 39.0–52.0)
Hemoglobin: 15.6 g/dL (ref 13.0–17.0)
Immature Granulocytes: 0 %
Lymphocytes Relative: 15 %
Lymphs Abs: 1.4 10*3/uL (ref 0.7–4.0)
MCH: 32.8 pg (ref 26.0–34.0)
MCHC: 35.1 g/dL (ref 30.0–36.0)
MCV: 93.5 fL (ref 80.0–100.0)
Monocytes Absolute: 0.6 10*3/uL (ref 0.1–1.0)
Monocytes Relative: 6 %
Neutro Abs: 6.9 10*3/uL (ref 1.7–7.7)
Neutrophils Relative %: 78 %
PLATELETS: 171 10*3/uL (ref 150–400)
RBC: 4.75 MIL/uL (ref 4.22–5.81)
RDW: 12.8 % (ref 11.5–15.5)
WBC: 9 10*3/uL (ref 4.0–10.5)
nRBC: 0 % (ref 0.0–0.2)

## 2018-03-20 LAB — CMP (CANCER CENTER ONLY)
ALT: 23 U/L (ref 0–44)
AST: 23 U/L (ref 15–41)
Albumin: 4 g/dL (ref 3.5–5.0)
Alkaline Phosphatase: 94 U/L (ref 38–126)
Anion gap: 8 (ref 5–15)
BUN: 18 mg/dL (ref 8–23)
CO2: 26 mmol/L (ref 22–32)
Calcium: 9.3 mg/dL (ref 8.9–10.3)
Chloride: 105 mmol/L (ref 98–111)
Creatinine: 0.89 mg/dL (ref 0.61–1.24)
Glucose, Bld: 111 mg/dL — ABNORMAL HIGH (ref 70–99)
Potassium: 4.4 mmol/L (ref 3.5–5.1)
Sodium: 139 mmol/L (ref 135–145)
Total Bilirubin: 0.9 mg/dL (ref 0.3–1.2)
Total Protein: 6.6 g/dL (ref 6.5–8.1)

## 2018-03-20 LAB — TSH: TSH: 0.696 u[IU]/mL (ref 0.320–4.118)

## 2018-03-20 MED ORDER — SODIUM CHLORIDE 0.9% FLUSH
10.0000 mL | INTRAVENOUS | Status: DC | PRN
  Administered 2018-03-20: 10 mL
  Filled 2018-03-20: qty 10

## 2018-03-20 MED ORDER — SODIUM CHLORIDE 0.9 % IV SOLN
Freq: Once | INTRAVENOUS | Status: AC
  Administered 2018-03-20: 10:00:00 via INTRAVENOUS
  Filled 2018-03-20: qty 250

## 2018-03-20 MED ORDER — SODIUM CHLORIDE 0.9 % IV SOLN
200.0000 mg | Freq: Once | INTRAVENOUS | Status: AC
  Administered 2018-03-20: 200 mg via INTRAVENOUS
  Filled 2018-03-20: qty 8

## 2018-03-20 MED ORDER — HEPARIN SOD (PORK) LOCK FLUSH 100 UNIT/ML IV SOLN
500.0000 [IU] | Freq: Once | INTRAVENOUS | Status: AC | PRN
  Administered 2018-03-20: 500 [IU]
  Filled 2018-03-20: qty 5

## 2018-03-20 NOTE — Patient Instructions (Signed)
[facility] Discharge Instructions for Patients Receiving Chemotherapy  Today you received the following chemotherapy agents :  pembrolizumab Beryle Flock).  To help prevent nausea and vomiting after your treatment, we encourage you to take your nausea medication as prescribed.   If you develop nausea and vomiting that is not controlled by your nausea medication, call the clinic.   BELOW ARE SYMPTOMS THAT SHOULD BE REPORTED IMMEDIATELY:  *FEVER GREATER THAN 100.5 F  *CHILLS WITH OR WITHOUT FEVER  NAUSEA AND VOMITING THAT IS NOT CONTROLLED WITH YOUR NAUSEA MEDICATION  *UNUSUAL SHORTNESS OF BREATH  *UNUSUAL BRUISING OR BLEEDING  TENDERNESS IN MOUTH AND THROAT WITH OR WITHOUT PRESENCE OF ULCERS  *URINARY PROBLEMS  *BOWEL PROBLEMS  UNUSUAL RASH Items with * indicate a potential emergency and should be followed up as soon as possible.  Feel free to call the clinic should you have any questions or concerns. The clinic phone number is [phone].  Please show the [facility] at check-in to the Emergency Department and triage nurse.

## 2018-03-20 NOTE — Patient Instructions (Signed)

## 2018-03-20 NOTE — Progress Notes (Signed)
[facility] [phone]   [phone]  OFFICE PROGRESS NOTE  Victor Dy, MD [address], [place] [address]  DIAGNOSIS: Metastatic non-small cell lung cancer initially diagnosed as stage IIIA (T2a, N2, M0) non-small cell lung cancer, poorly differentiated adenocarcinoma presented with left lower lobe lung mass in addition to mediastinal lymphadenopathy.  The patient was diagnosed with metastatic disease involving the left femur as well as left supraclavicular nodal metastases and right paratracheal lymphadenopathy in October 2018.  Biomarker Findings Microsatellite Status - MS-Stable Tumor Mutational Burden - TMB-Low (3 Muts/Mb) Genomic Findings For a complete list of the genes assayed, please refer to the Appendix. STK11 P282f*6 [id number] site 1229+7L>GDAXX E374* MLL2 [id number] NBN K23370f NOTCH2 [id number] splice site [id number]+0C>XDisease relevant genes with no reportable alterations: EGFR, KRAS, ALK, BRAF, MET, RET, ERBB2, ROS1   PDL1 expression 5%  PRIOR THERAPY: 1) status post wedge resection of the left lower lobe lung mass as well as AP window lymph node dissection but there was residual metastatic mediastinal lymphadenopathy that could not be resected. 2) a course of concurrent chemoradiation with weekly carboplatin [place].  3) status post palliative radiotherapy to the left femur metastatic bone disease. 4)  Systemic chemotherapy with carboplatin for AUC of 5, Alimta 500 mg/M2 and Keytruda 200 mg IV every 3 weeks.  First dose March 07, 2017.  Carboplatin was discontinued during cycle #2 secondary to hypersensitivity reaction. 5) status post 2 cycles of maintenance treatment with Alimta and Ketruda (pembrolizumab).  Alimta was discontinued secondary to intolerance.  CURRENT THERAPY: Maintenance treatment with single agent Ketruda (pembrolizumab) status post 13  cycles.  INTERVAL HISTORY: Victor Huber 64o. male returns to the clinic today for follow-up visit accompanied by his son.  The patient is feeling fine today with no concerning complaints.  He continues to tolerate his treatment with Keytruda fairly well.  He denied having any chest pain, shortness of breath, cough or hemoptysis.  He denied having any fever or chills.  He has no nausea, vomiting, diarrhea or constipation.  The patient has no headache or visual changes.  He is here today for evaluation before starting cycle #14.   MEDICAL HISTORY: Past Medical History:  Diagnosis Date  . Adenocarcinoma of left lung, stage 3 ([facility] [place]/16/2018  . Arthritis   . Atrial fibrillation (HCC[facility]/16/2018  . Atrial fibrillation (HCCRen[place] . COPD (chronic obstructive pulmonary disease) (HCCHydro/16/2018  . History of chemotherapy   . History of radiation therapy   . Hyperlipidemia   . Hypothyroid 08/15/2016  . Longstanding persistent atrial fibrillation 08/29/2016  . Pathologic fracture    left femur  . Pneumonitis   . S/P TURP 08/15/2016  . Wears glasses   . Wears hearing aid in both ears     ALLERGIES:  is allergic to carboplatin and flecainide.  MEDICATIONS:  Current Outpatient Medications  Medication Sig Dispense Refill  . apixaban (ELIQUIS) 5 MG TABS tablet Take 1 tablet (5 mg total) by mouth 2 (two) times daily. 180 tablet 3  . atenolol (TENORMIN) 50 MG tablet Take 1 tablet (50 mg total) by mouth daily. 90 tablet 3  . atorvastatin (LIPITOR) 40 MG tablet Take 40 mg by mouth daily.    . baclofen (LIORESAL) 10 MG tablet Take 1 tablet (10 mg total) by mouth 3 (three) times daily. As needed for muscle spasm (Patient not taking: Reported on  01/16/2018) 50 tablet 0  . citalopram (CELEXA) 40 MG tablet Take 40 mg by mouth daily.    . clindamycin (CLINDAGEL) 1 % gel Apply topically 2 (two) times daily as needed. For rash on face (Patient not taking: Reported on 02/06/2018) 60 g 2  . ibuprofen  (ADVIL,MOTRIN) 400 MG tablet Take 400 mg by mouth every 4 (four) hours as needed for moderate pain.    Marland Kitchen levothyroxine (SYNTHROID, LEVOTHROID) 150 MCG tablet Take 150 mcg by mouth daily.    Marland Kitchen LORazepam (ATIVAN) 0.5 MG tablet Take 1 tablet (0.5 mg total) by mouth every 8 (eight) hours as needed (nausea). (Patient not taking: Reported on 01/16/2018) 30 tablet 0  . ondansetron (ZOFRAN) 4 MG tablet Take 1 tablet (4 mg total) by mouth every 8 (eight) hours as needed for nausea or vomiting. (Patient not taking: Reported on 01/16/2018) 30 tablet 0  . oxyCODONE (ROXICODONE) 5 MG immediate release tablet Take 1-2 tablets (5-10 mg total) by mouth every 4 (four) hours as needed for severe pain. 50 tablet 0  . prochlorperazine (COMPAZINE) 10 MG tablet Take 1 tablet (10 mg total) by mouth every 6 (six) hours as needed for nausea or vomiting. (Patient not taking: Reported on 01/16/2018) 30 tablet 0  . sennosides-docusate sodium (SENOKOT-S) 8.6-50 MG tablet Take 2 tablets by mouth daily. (Patient not taking: Reported on 01/16/2018) 30 tablet 1  . tamsulosin (FLOMAX) 0.4 MG CAPS capsule Take 0.4 mg daily by mouth.     . zolpidem (AMBIEN) 5 MG tablet Take 5 mg by mouth at bedtime as needed for sleep.     No current facility-administered medications for this visit.     SURGICAL HISTORY:  Past Surgical History:  Procedure Laterality Date  . BRONCHOSCOPY  10/2014  . CARDIAC CATHETERIZATION     05/07/12  . CARDIOVERSION     x2  . COLONOSCOPY    . DG BIOPSY LUNG Left 10/2014   FNA - Adenocarcinoma   . FEMUR IM NAIL Left 02/19/2017  . FEMUR IM NAIL Left 02/19/2017   Procedure: INTRAMEDULLARY (IM) NAIL FEMORAL;  Surgeon: Marchia Bond, MD;  Location: [facility];  Service: Orthopedics;  Laterality: Left;  . IR FLUORO GUIDE PORT INSERTION RIGHT  07/03/2017  . IR US GUIDE VASC ACCESS RIGHT  07/03/2017  . LUNG CANCER SURGERY Left 12/2014   Wedge Resection   . MULTIPLE TOOTH EXTRACTIONS    . Status post TURP    .  TONSILLECTOMY      REVIEW OF SYSTEMS:  A comprehensive review of systems was negative except for: Musculoskeletal: positive for arthralgias   PHYSICAL EXAMINATION: General appearance: alert, cooperative and no distress Head: Normocephalic, without obvious abnormality, atraumatic Neck: no adenopathy, no JVD, supple, symmetrical, trachea midline and thyroid not enlarged, symmetric, no tenderness/mass/nodules Lymph nodes: Cervical, supraclavicular, and axillary nodes normal. Resp: clear to auscultation bilaterally Back: symmetric, no curvature. ROM normal. No CVA tenderness. Cardio: regular rate and rhythm, S1, S2 normal, no murmur, click, rub or gallop GI: soft, non-tender; bowel sounds normal; no masses,  no organomegaly Extremities: extremities normal, atraumatic, no cyanosis or edema  ECOG PERFORMANCE STATUS: 1 - Symptomatic but completely ambulatory  Blood pressure (!) 120/91, pulse 97, temperature 97.7 F (36.5 C), temperature source Oral, resp. rate 18, height 6' 1"  (1.854 m), weight 237 lb (107.5 kg), SpO2 97 %.  LABORATORY DATA: Lab Results  Component Value Date   WBC 9.0 03/20/2018   HGB 15.6 03/20/2018   HCT 44.4 03/20/2018  MCV 93.5 03/20/2018   PLT 171 03/20/2018      Chemistry      Component Value Date/Time   NA 141 02/26/2018 0907   NA 136 04/04/2017 1141   K 4.5 02/26/2018 0907   K 4.4 04/04/2017 1141   CL 106 02/26/2018 0907   CO2 25 02/26/2018 0907   CO2 24 04/04/2017 1141   BUN 15 02/26/2018 0907   BUN 21.1 04/04/2017 1141   CREATININE 0.85 02/26/2018 0907   CREATININE 1.1 04/04/2017 1141      Component Value Date/Time   CALCIUM 9.4 02/26/2018 0907   CALCIUM 9.1 04/04/2017 1141   ALKPHOS 98 02/26/2018 0907   ALKPHOS 89 04/04/2017 1141   AST 23 02/26/2018 0907   AST 17 04/04/2017 1141   ALT 21 02/26/2018 0907   ALT 18 04/04/2017 1141   BILITOT 0.9 02/26/2018 0907   BILITOT 0.67 04/04/2017 1141       RADIOGRAPHIC STUDIES: No results  found.  ASSESSMENT AND PLAN: This is a 67 years old white male with metastatic non-small cell lung cancer, adenocarcinoma with no actionable mutations and PDL 1 expression of 5% that was initially diagnosed as stage IIIa non-small cell lung cancer, adenocarcinoma status post left lower lobectomy with lymph node dissection followed by a course of concurrent chemoradiation completed in January 2016.  The patient had evidence for disease metastasis in October 2018 with metastatic disease to the left femur as well as left supraclavicular and right paratracheal lymph nodes. The patient is currently on systemic chemotherapy initially was with carboplatin, Alimta and Keytruda.  Carboplatin was discontinued secondary to hypersensitivity reaction starting from cycle #2.   He was also treated with 3 cycles of maintenance Alimta and Ketruda (pembrolizumab) but Alimta was discontinued secondary to intolerance. He is currently undergoing treatment with maintenance Keytruda as a single agent status post 13 cycles.  The patient continues to tolerate this treatment well with no concerning adverse effects. I recommended for him to proceed with cycle #14 today as scheduled. He will come back for follow-up visit in 3 weeks for evaluation before starting the next cycle of his treatment. The patient was advised to call immediately if he has any concerning symptoms in the interval. The patient voices understanding of current disease status and treatment options and is in agreement with the current care plan. All questions were answered. The patient knows to call the clinic with any problems, questions or concerns. We can certainly see the patient much sooner if necessary.  Disclaimer: This note was dictated with voice recognition software. Similar sounding words can inadvertently be transcribed and may not be corrected upon review.

## 2018-03-21 ENCOUNTER — Telehealth: Payer: Self-pay | Admitting: Internal Medicine

## 2018-03-21 NOTE — Telephone Encounter (Signed)
3 cycles already scheduled per 12/19 los.

## 2018-04-10 ENCOUNTER — Inpatient Hospital Stay: Payer: Medicare HMO | Attending: Internal Medicine

## 2018-04-10 ENCOUNTER — Telehealth: Payer: Self-pay | Admitting: Internal Medicine

## 2018-04-10 ENCOUNTER — Inpatient Hospital Stay: Payer: Medicare HMO

## 2018-04-10 ENCOUNTER — Other Ambulatory Visit: Payer: Self-pay | Admitting: Medical Oncology

## 2018-04-10 ENCOUNTER — Encounter: Payer: Self-pay | Admitting: Internal Medicine

## 2018-04-10 ENCOUNTER — Inpatient Hospital Stay (HOSPITAL_BASED_OUTPATIENT_CLINIC_OR_DEPARTMENT_OTHER): Payer: Medicare HMO | Admitting: Internal Medicine

## 2018-04-10 VITALS — BP 123/91 | HR 92 | Temp 98.2°F | Resp 18 | Ht 73.0 in | Wt 238.0 lb

## 2018-04-10 DIAGNOSIS — Z9221 Personal history of antineoplastic chemotherapy: Secondary | ICD-10-CM | POA: Diagnosis not present

## 2018-04-10 DIAGNOSIS — E039 Hypothyroidism, unspecified: Secondary | ICD-10-CM

## 2018-04-10 DIAGNOSIS — J441 Chronic obstructive pulmonary disease with (acute) exacerbation: Secondary | ICD-10-CM

## 2018-04-10 DIAGNOSIS — Z95828 Presence of other vascular implants and grafts: Secondary | ICD-10-CM

## 2018-04-10 DIAGNOSIS — C3492 Malignant neoplasm of unspecified part of left bronchus or lung: Secondary | ICD-10-CM | POA: Diagnosis not present

## 2018-04-10 DIAGNOSIS — C3432 Malignant neoplasm of lower lobe, left bronchus or lung: Secondary | ICD-10-CM | POA: Insufficient documentation

## 2018-04-10 DIAGNOSIS — C7951 Secondary malignant neoplasm of bone: Secondary | ICD-10-CM | POA: Diagnosis not present

## 2018-04-10 DIAGNOSIS — J449 Chronic obstructive pulmonary disease, unspecified: Secondary | ICD-10-CM | POA: Diagnosis not present

## 2018-04-10 DIAGNOSIS — C3490 Malignant neoplasm of unspecified part of unspecified bronchus or lung: Secondary | ICD-10-CM

## 2018-04-10 DIAGNOSIS — Z923 Personal history of irradiation: Secondary | ICD-10-CM | POA: Insufficient documentation

## 2018-04-10 DIAGNOSIS — C771 Secondary and unspecified malignant neoplasm of intrathoracic lymph nodes: Secondary | ICD-10-CM | POA: Insufficient documentation

## 2018-04-10 DIAGNOSIS — Z5112 Encounter for antineoplastic immunotherapy: Secondary | ICD-10-CM

## 2018-04-10 LAB — CMP (CANCER CENTER ONLY)
ALBUMIN: 4 g/dL (ref 3.5–5.0)
ALT: 21 U/L (ref 0–44)
ANION GAP: 10 (ref 5–15)
AST: 20 U/L (ref 15–41)
Alkaline Phosphatase: 91 U/L (ref 38–126)
BUN: 24 mg/dL — AB (ref 8–23)
CO2: 24 mmol/L (ref 22–32)
Calcium: 9.2 mg/dL (ref 8.9–10.3)
Chloride: 106 mmol/L (ref 98–111)
Creatinine: 0.87 mg/dL (ref 0.61–1.24)
GFR, Est AFR Am: >60 mL/min (ref >60)
GFR, Estimated: >60 mL/min (ref >60)
GLUCOSE: 114 mg/dL — AB (ref 70–99)
POTASSIUM: 4.4 mmol/L (ref 3.5–5.1)
Sodium: 140 mmol/L (ref 135–145)
Total Bilirubin: 0.9 mg/dL (ref 0.3–1.2)
Total Protein: 6.7 g/dL (ref 6.5–8.1)

## 2018-04-10 LAB — TSH: TSH: 0.313 u[IU]/mL — ABNORMAL LOW (ref 0.320–4.118)

## 2018-04-10 LAB — CBC WITH DIFFERENTIAL (CANCER CENTER ONLY)
Abs Immature Granulocytes: 0.03 10*3/uL (ref 0.00–0.07)
Basophils Absolute: 0 10*3/uL (ref 0.0–0.1)
Basophils Relative: 0 %
Eosinophils Absolute: 0.1 10*3/uL (ref 0.0–0.5)
Eosinophils Relative: 1 %
HEMATOCRIT: 44.1 % (ref 39.0–52.0)
Hemoglobin: 15.8 g/dL (ref 13.0–17.0)
Immature Granulocytes: 0 %
LYMPHS ABS: 1.5 10*3/uL (ref 0.7–4.0)
Lymphocytes Relative: 18 %
MCH: 33.8 pg (ref 26.0–34.0)
MCHC: 35.8 g/dL (ref 30.0–36.0)
MCV: 94.2 fL (ref 80.0–100.0)
Monocytes Absolute: 0.7 10*3/uL (ref 0.1–1.0)
Monocytes Relative: 8 %
Neutro Abs: 6.2 10*3/uL (ref 1.7–7.7)
Neutrophils Relative %: 73 %
Platelet Count: 164 10*3/uL (ref 150–400)
RBC: 4.68 MIL/uL (ref 4.22–5.81)
RDW: 13.5 % (ref 11.5–15.5)
WBC Count: 8.6 10*3/uL (ref 4.0–10.5)
nRBC: 0 % (ref 0.0–0.2)

## 2018-04-10 MED ORDER — SODIUM CHLORIDE 0.9% FLUSH
10.0000 mL | INTRAVENOUS | Status: DC | PRN
  Administered 2018-04-10: 10 mL
  Filled 2018-04-10: qty 10

## 2018-04-10 MED ORDER — SODIUM CHLORIDE 0.9 % IV SOLN
200.0000 mg | Freq: Once | INTRAVENOUS | Status: AC
  Administered 2018-04-10: 200 mg via INTRAVENOUS
  Filled 2018-04-10: qty 8

## 2018-04-10 MED ORDER — HEPARIN SOD (PORK) LOCK FLUSH 100 UNIT/ML IV SOLN
500.0000 [IU] | Freq: Once | INTRAVENOUS | Status: AC | PRN
  Administered 2018-04-10: 500 [IU]
  Filled 2018-04-10: qty 5

## 2018-04-10 MED ORDER — SODIUM CHLORIDE 0.9 % IV SOLN
Freq: Once | INTRAVENOUS | Status: AC
  Administered 2018-04-10: 10:00:00 via INTRAVENOUS
  Filled 2018-04-10: qty 250

## 2018-04-10 NOTE — Telephone Encounter (Signed)
Printed calendar and avs.  Gave patient contrast for CT scan that will soon be scheduled.

## 2018-04-10 NOTE — Progress Notes (Signed)
[facility] [phone]   Fax:(336) [phone]  OFFICE PROGRESS NOTE  Victor Dy, Victor Huber [address] [place] [address]  DIAGNOSIS: Metastatic non-small cell lung cancer initially diagnosed as stage IIIA (T2a, N2, M0) non-small cell lung cancer, poorly differentiated adenocarcinoma presented with left lower lobe lung mass in addition to mediastinal lymphadenopathy.  The patient was diagnosed with metastatic disease involving the left femur as well as left supraclavicular nodal metastases and right paratracheal lymphadenopathy in October 2018.  Biomarker Findings Microsatellite Status - MS-Stable Tumor Mutational Burden - TMB-Low (3 Muts/Mb) Genomic Findings For a complete list of the genes assayed, please refer to the Appendix. STK11 P232f*6 [id number] site 1115+5M>CDAXX E374* MLL2 V45367f40 NBN K23330f NOTCH2 [id number] splice site 988612+2E>SDisease relevant genes with no reportable alterations: EGFR, KRAS, ALK, BRAF, MET, RET, ERBB2, ROS1   PDL1 expression 5%  PRIOR THERAPY: 1) status post wedge resection of the left lower lobe lung mass as well as AP window lymph node dissection but there was residual metastatic mediastinal lymphadenopathy that could not be resected. 2) a course of concurrent chemoradiation with weekly carboplatin and paclitaxel [place] 03/01/2015.  3) status post palliative radiotherapy to the left femur metastatic bone disease. 4)  Systemic chemotherapy with carboplatin for AUC of 5, Alimta 500 mg/M2 and Keytruda 200 mg IV every 3 weeks.  First dose March 07, 2017.  Carboplatin was discontinued during cycle #2 secondary to hypersensitivity reaction. 5) status post 2 cycles of maintenance treatment with Alimta and Ketruda (pembrolizumab).  Alimta was discontinued secondary to intolerance.  CURRENT THERAPY: Maintenance treatment with single agent Ketruda (pembrolizumab) status post 14  cycles.  INTERVAL HISTORY: Victor Huber 68. male returns to the clinic today for follow-up visit accompanied by his son.  The patient is feeling fine today with no concerning complaints except for persistent fatigue and generalized weakness and stiffness in his muscle and joints as well as mild rash.  He also has shortness of breath with exertion and confusions at times with dizzy spells as well as balance problems.  He mentions that he had cardiac arrest few years ago and since that time he had the neurological abnormalities.  He denied having any current chest pain, cough or hemoptysis.  He denied having any fever or chills.  He has no nausea, vomiting, diarrhea or constipation.  He has no recent weight loss or night sweats.  The patient is here today for evaluation before starting cycle #15.  MEDICAL HISTORY: Past Medical History:  Diagnosis Date  . Adenocarcinoma of left lung, stage 3 ([facility]/16/2018  . Arthritis   . Atrial fibrillation ([facility]/16/2018  . Atrial fibrillation ([facility]CC[facility] . COPD (chronic obstructive pulmonary disease) (HCCPort [facility]/16/2018  . History of chemotherapy   . History of radiation therapy   . Hyperlipidemia   . Hypothyroid 08/15/2016  . Longstanding persistent atrial fibrillation 08/29/2016  . Pathologic fracture    left femur  . Pneumonitis   . S/P TURP 08/15/2016  . Wears glasses   . Wears hearing aid in both ears     ALLERGIES:  is allergic to carboplatin and flecainide.  MEDICATIONS:  Current Outpatient Medications  Medication Sig Dispense Refill  . apixaban (ELIQUIS) 5 MG TABS tablet Take 1 tablet (5 mg total) by mouth 2 (two) times daily. 180 tablet 3  . atenolol (TENORMIN) 50 MG tablet Take 1 tablet (50 mg total) by mouth  daily. 90 tablet 3  . atorvastatin (LIPITOR) 40 MG tablet Take 40 mg by mouth daily.    . baclofen (LIORESAL) 10 MG tablet Take 1 tablet (10 mg total) by mouth 3 (three) times daily. As needed for muscle spasm (Patient not taking:  Reported on 01/16/2018) 50 tablet 0  . citalopram (CELEXA) 40 MG tablet Take 40 mg by mouth daily.    . clindamycin (CLINDAGEL) 1 % gel Apply topically 2 (two) times daily as needed. For rash on face (Patient not taking: Reported on 02/06/2018) 60 g 2  . ibuprofen (ADVIL,MOTRIN) 400 MG tablet Take 400 mg by mouth every 4 (four) hours as needed for moderate pain.    Marland Kitchen levothyroxine (SYNTHROID, LEVOTHROID) 150 MCG tablet Take 150 mcg by mouth daily.    Marland Kitchen LORazepam (ATIVAN) 0.5 MG tablet Take 1 tablet (0.5 mg total) by mouth every 8 (eight) hours as needed (nausea). (Patient not taking: Reported on 01/16/2018) 30 tablet 0  . ondansetron (ZOFRAN) 4 MG tablet Take 1 tablet (4 mg total) by mouth every 8 (eight) hours as needed for nausea or vomiting. (Patient not taking: Reported on 01/16/2018) 30 tablet 0  . oxyCODONE (ROXICODONE) 5 MG immediate release tablet Take 1-2 tablets (5-10 mg total) by mouth every 4 (four) hours as needed for severe pain. 50 tablet 0  . prochlorperazine (COMPAZINE) 10 MG tablet Take 1 tablet (10 mg total) by mouth every 6 (six) hours as needed for nausea or vomiting. (Patient not taking: Reported on 01/16/2018) 30 tablet 0  . sennosides-docusate sodium (SENOKOT-S) 8.6-50 MG tablet Take 2 tablets by mouth daily. (Patient not taking: Reported on 01/16/2018) 30 tablet 1  . tamsulosin (FLOMAX) 0.4 MG CAPS capsule Take 0.4 mg daily by mouth.     . zolpidem (AMBIEN) 5 MG tablet Take 5 mg by mouth at bedtime as needed for sleep.     No current facility-administered medications for this visit.     SURGICAL HISTORY:  Past Surgical History:  Procedure Laterality Date  . BRONCHOSCOPY  10/2014  . CARDIAC CATHETERIZATION     05/07/12  . CARDIOVERSION     x2  . COLONOSCOPY    . DG BIOPSY LUNG Left 10/2014   FNA - Adenocarcinoma   . FEMUR IM NAIL Left 02/19/2017  . FEMUR IM NAIL Left 02/19/2017   Procedure: INTRAMEDULLARY (IM) NAIL FEMORAL;  Surgeon: Marchia Bond, Victor Huber;  Location: [facility];  Service: Orthopedics;  Laterality: Left;  . IR FLUORO GUIDE PORT INSERTION RIGHT  07/03/2017  . IR US GUIDE VASC ACCESS RIGHT  07/03/2017  . LUNG CANCER SURGERY Left 12/2014   Wedge Resection   . MULTIPLE TOOTH EXTRACTIONS    . Status post TURP    . TONSILLECTOMY      REVIEW OF SYSTEMS:  A comprehensive review of systems was negative except for: Constitutional: positive for fatigue Musculoskeletal: positive for arthralgias and muscle weakness Neurological: positive for dizziness and memory problems   PHYSICAL EXAMINATION: General appearance: alert, cooperative and no distress Head: Normocephalic, without obvious abnormality, atraumatic Neck: no adenopathy, no JVD, supple, symmetrical, trachea midline and thyroid not enlarged, symmetric, no tenderness/mass/nodules Lymph nodes: Cervical, supraclavicular, and axillary nodes normal. Resp: clear to auscultation bilaterally Back: symmetric, no curvature. ROM normal. No CVA tenderness. Cardio: regular rate and rhythm, S1, S2 normal, no murmur, click, rub or gallop GI: soft, non-tender; bowel sounds normal; no masses,  no organomegaly Extremities: extremities normal, atraumatic, no cyanosis or edema  ECOG PERFORMANCE STATUS:  1 - Symptomatic but completely ambulatory  Blood pressure (!) 123/91, pulse 92, temperature 98.2 F (36.8 C), temperature source Oral, resp. rate 18, height _0  (1.854 m), weight 238 lb (108 kg), SpO2 95 %.  LABORATORY DATA: Lab Results  Component Value Date   WBC 9.0 03/20/2018   HGB 15.6 03/20/2018   HCT 44.4 03/20/2018   MCV 93.5 03/20/2018   PLT 171 03/20/2018      Chemistry      Component Value Date/Time   NA 139 03/20/2018 0827   NA 136 04/04/2017 1141   K 4.4 03/20/2018 0827   K 4.4 04/04/2017 1141   CL 105 03/20/2018 0827   CO2 26 03/20/2018 0827   CO2 24 04/04/2017 1141   BUN 18 03/20/2018 0827   BUN 21.1 04/04/2017 1141   CREATININE 0.89 03/20/2018 0827   CREATININE 1.1 04/04/2017 1141       Component Value Date/Time   CALCIUM 9.3 03/20/2018 0827   CALCIUM 9.1 04/04/2017 1141   ALKPHOS 94 03/20/2018 0827   ALKPHOS 89 04/04/2017 1141   AST 23 03/20/2018 0827   AST 17 04/04/2017 1141   ALT 23 03/20/2018 0827   ALT 18 04/04/2017 1141   BILITOT 0.9 03/20/2018 0827   BILITOT 0.67 04/04/2017 1141       RADIOGRAPHIC STUDIES: No results found.  ASSESSMENT AND PLAN: This is a 68 years old white male with metastatic non-small cell lung cancer, adenocarcinoma with no actionable mutations and PDL 1 expression of 5% that was initially diagnosed as stage IIIa non-small cell lung cancer, adenocarcinoma status post left lower lobectomy with lymph node dissection followed by a course of concurrent chemoradiation completed in January 2016.  The patient had evidence for disease metastasis in October 2018 with metastatic disease to the left femur as well as left supraclavicular and right paratracheal lymph nodes. The patient is currently on systemic chemotherapy initially was with carboplatin, Alimta and Keytruda.  Carboplatin was discontinued secondary to hypersensitivity reaction starting from cycle #2.   He was also treated with 3 cycles of maintenance Alimta and Ketruda (pembrolizumab) but Alimta was discontinued secondary to intolerance. He is currently undergoing treatment with maintenance Keytruda as a single agent status post 14 cycles.  He has been tolerating this treatment well with no concerning adverse effects. I recommended for the patient to proceed with cycle #15 today as scheduled. For the memory, and balance issues the patient will be referred to Dr. Mickeal Skinner for evaluation and recommendation regarding his condition. I will see him back for follow-up visit in 3 weeks for evaluation after repeating CT scan of the chest, abdomen and pelvis for restaging of his disease. The patient was advised to call immediately if he has any concerning symptoms in the interval. The patient voices  understanding of current disease status and treatment options and is in agreement with the current care plan. All questions were answered. The patient knows to call the clinic with any problems, questions or concerns. We can certainly see the patient much sooner if necessary.  Disclaimer: This note was dictated with voice recognition software. Similar sounding words can inadvertently be transcribed and may not be corrected upon review.

## 2018-04-10 NOTE — Patient Instructions (Signed)
[facility] Discharge Instructions for Patients Receiving Chemotherapy  Today you received the following chemotherapy agents :  pembrolizumab Beryle Flock).  To help prevent nausea and vomiting after your treatment, we encourage you to take your nausea medication as prescribed.   If you develop nausea and vomiting that is not controlled by your nausea medication, call the clinic.   BELOW ARE SYMPTOMS THAT SHOULD BE REPORTED IMMEDIATELY:  *FEVER GREATER THAN 100.5 F  *CHILLS WITH OR WITHOUT FEVER  NAUSEA AND VOMITING THAT IS NOT CONTROLLED WITH YOUR NAUSEA MEDICATION  *UNUSUAL SHORTNESS OF BREATH  *UNUSUAL BRUISING OR BLEEDING  TENDERNESS IN MOUTH AND THROAT WITH OR WITHOUT PRESENCE OF ULCERS  *URINARY PROBLEMS  *BOWEL PROBLEMS  UNUSUAL RASH Items with * indicate a potential emergency and should be followed up as soon as possible.  Feel free to call the clinic should you have any questions or concerns. The clinic phone number is [phone].  Please show the [facility] at check-in to the Emergency Department and triage nurse.

## 2018-04-17 ENCOUNTER — Telehealth: Payer: Self-pay | Admitting: *Deleted

## 2018-04-17 NOTE — Telephone Encounter (Signed)
Advised patient

## 2018-04-17 NOTE — Telephone Encounter (Signed)
-----   Message from Skeet Latch, MD sent at 03/03/2018 11:47 AM EST ----- Cholesterol levels reviewed and were very good with the exception of his triglycerides.  Work on limiting carbs and alcohol.  Continue statin.  ----- Message ----- From: Earvin Hansen, LPN Sent: 09/30/1599   2:14 PM EST To: Skeet Latch, MD  Hey  Patient has lipids scanned in thought you might want to review Thanks Rip Harbour

## 2018-04-22 ENCOUNTER — Encounter (HOSPITAL_COMMUNITY): Payer: Self-pay

## 2018-04-22 ENCOUNTER — Ambulatory Visit (HOSPITAL_COMMUNITY): Payer: Medicare HMO

## 2018-04-29 ENCOUNTER — Ambulatory Visit (HOSPITAL_COMMUNITY)
Admission: RE | Admit: 2018-04-29 | Discharge: 2018-04-29 | Disposition: A | Discharge status: Home / Self Care | Payer: Medicare HMO | Source: Ambulatory Visit | Attending: Internal Medicine | Admitting: Internal Medicine

## 2018-04-29 ENCOUNTER — Encounter (HOSPITAL_COMMUNITY): Payer: Self-pay

## 2018-04-29 DIAGNOSIS — C3490 Malignant neoplasm of unspecified part of unspecified bronchus or lung: Secondary | ICD-10-CM | POA: Insufficient documentation

## 2018-04-29 MED ORDER — SODIUM CHLORIDE (PF) 0.9 % IJ SOLN
INTRAMUSCULAR | Status: AC
  Filled 2018-04-29: qty 50

## 2018-04-29 MED ORDER — HEPARIN SOD (PORK) LOCK FLUSH 100 UNIT/ML IV SOLN
INTRAVENOUS | Status: AC
  Filled 2018-04-29: qty 5

## 2018-04-29 MED ORDER — HEPARIN SOD (PORK) LOCK FLUSH 100 UNIT/ML IV SOLN
500.0000 [IU] | Freq: Once | INTRAVENOUS | Status: AC
  Administered 2018-04-29: 500 [IU] via INTRAVENOUS

## 2018-04-29 MED ORDER — IOHEXOL 300 MG/ML  SOLN
100.0000 mL | Freq: Once | INTRAMUSCULAR | Status: AC | PRN
  Administered 2018-04-29: 100 mL via INTRAVENOUS

## 2018-05-01 ENCOUNTER — Inpatient Hospital Stay: Payer: Medicare HMO

## 2018-05-01 ENCOUNTER — Encounter: Payer: Self-pay | Admitting: Internal Medicine

## 2018-05-01 ENCOUNTER — Other Ambulatory Visit: Payer: Self-pay | Admitting: Internal Medicine

## 2018-05-01 ENCOUNTER — Inpatient Hospital Stay (HOSPITAL_BASED_OUTPATIENT_CLINIC_OR_DEPARTMENT_OTHER): Payer: Medicare HMO | Admitting: Internal Medicine

## 2018-05-01 VITALS — BP 106/82 | HR 86 | Temp 97.6°F | Resp 18 | Ht 73.0 in | Wt 243.2 lb

## 2018-05-01 DIAGNOSIS — Z923 Personal history of irradiation: Secondary | ICD-10-CM

## 2018-05-01 DIAGNOSIS — C3432 Malignant neoplasm of lower lobe, left bronchus or lung: Secondary | ICD-10-CM

## 2018-05-01 DIAGNOSIS — Z79899 Other long term (current) drug therapy: Secondary | ICD-10-CM

## 2018-05-01 DIAGNOSIS — Z5112 Encounter for antineoplastic immunotherapy: Secondary | ICD-10-CM

## 2018-05-01 DIAGNOSIS — C3492 Malignant neoplasm of unspecified part of left bronchus or lung: Secondary | ICD-10-CM

## 2018-05-01 DIAGNOSIS — C7951 Secondary malignant neoplasm of bone: Secondary | ICD-10-CM

## 2018-05-01 DIAGNOSIS — Z9221 Personal history of antineoplastic chemotherapy: Secondary | ICD-10-CM

## 2018-05-01 DIAGNOSIS — J449 Chronic obstructive pulmonary disease, unspecified: Secondary | ICD-10-CM

## 2018-05-01 DIAGNOSIS — E039 Hypothyroidism, unspecified: Secondary | ICD-10-CM

## 2018-05-01 DIAGNOSIS — Z95828 Presence of other vascular implants and grafts: Secondary | ICD-10-CM

## 2018-05-01 DIAGNOSIS — Z87891 Personal history of nicotine dependence: Secondary | ICD-10-CM

## 2018-05-01 DIAGNOSIS — R413 Other amnesia: Secondary | ICD-10-CM | POA: Insufficient documentation

## 2018-05-01 DIAGNOSIS — C771 Secondary and unspecified malignant neoplasm of intrathoracic lymph nodes: Secondary | ICD-10-CM

## 2018-05-01 LAB — CMP (CANCER CENTER ONLY)
ALT: 19 U/L (ref 0–44)
AST: 19 U/L (ref 15–41)
Albumin: 3.9 g/dL (ref 3.5–5.0)
Alkaline Phosphatase: 91 U/L (ref 38–126)
Anion gap: 8 (ref 5–15)
BUN: 17 mg/dL (ref 8–23)
CO2: 26 mmol/L (ref 22–32)
Calcium: 9.2 mg/dL (ref 8.9–10.3)
Chloride: 106 mmol/L (ref 98–111)
Creatinine: 0.96 mg/dL (ref 0.61–1.24)
GFR, Est AFR Am: >60 mL/min (ref >60)
GFR, Estimated: >60 mL/min (ref >60)
Glucose, Bld: 110 mg/dL — ABNORMAL HIGH (ref 70–99)
Potassium: 4.5 mmol/L (ref 3.5–5.1)
Sodium: 140 mmol/L (ref 135–145)
TOTAL PROTEIN: 6.5 g/dL (ref 6.5–8.1)
Total Bilirubin: 0.9 mg/dL (ref 0.3–1.2)

## 2018-05-01 LAB — CBC WITH DIFFERENTIAL (CANCER CENTER ONLY)
Abs Immature Granulocytes: 0.04 10*3/uL (ref 0.00–0.07)
Basophils Absolute: 0 10*3/uL (ref 0.0–0.1)
Basophils Relative: 0 %
EOS PCT: 1 %
Eosinophils Absolute: 0.1 10*3/uL (ref 0.0–0.5)
HCT: 44.1 % (ref 39.0–52.0)
Hemoglobin: 15.7 g/dL (ref 13.0–17.0)
Immature Granulocytes: 1 %
Lymphocytes Relative: 20 %
Lymphs Abs: 1.5 10*3/uL (ref 0.7–4.0)
MCH: 33.6 pg (ref 26.0–34.0)
MCHC: 35.6 g/dL (ref 30.0–36.0)
MCV: 94.4 fL (ref 80.0–100.0)
MONO ABS: 0.7 10*3/uL (ref 0.1–1.0)
Monocytes Relative: 9 %
Neutro Abs: 5.3 10*3/uL (ref 1.7–7.7)
Neutrophils Relative %: 69 %
Platelet Count: 166 10*3/uL (ref 150–400)
RBC: 4.67 MIL/uL (ref 4.22–5.81)
RDW: 13.6 % (ref 11.5–15.5)
WBC Count: 7.7 10*3/uL (ref 4.0–10.5)
nRBC: 0 % (ref 0.0–0.2)

## 2018-05-01 LAB — TSH: TSH: 2.148 u[IU]/mL (ref 0.320–4.118)

## 2018-05-01 MED ORDER — HEPARIN SOD (PORK) LOCK FLUSH 100 UNIT/ML IV SOLN
500.0000 [IU] | Freq: Once | INTRAVENOUS | Status: AC | PRN
  Administered 2018-05-01: 500 [IU]
  Filled 2018-05-01: qty 5

## 2018-05-01 MED ORDER — SODIUM CHLORIDE 0.9 % IV SOLN
Freq: Once | INTRAVENOUS | Status: AC
  Administered 2018-05-01: 10:00:00 via INTRAVENOUS
  Filled 2018-05-01: qty 250

## 2018-05-01 MED ORDER — SODIUM CHLORIDE 0.9% FLUSH
10.0000 mL | INTRAVENOUS | Status: DC | PRN
  Administered 2018-05-01: 10 mL
  Filled 2018-05-01: qty 10

## 2018-05-01 MED ORDER — SODIUM CHLORIDE 0.9 % IV SOLN
200.0000 mg | Freq: Once | INTRAVENOUS | Status: AC
  Administered 2018-05-01: 200 mg via INTRAVENOUS
  Filled 2018-05-01: qty 8
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
## 2018-05-01 NOTE — Patient Instructions (Signed)
[facility] Discharge Instructions for Patients Receiving Chemotherapy  Today you received the following chemotherapy agents: pembrolizumab Beryle Flock).  To help prevent nausea and vomiting after your treatment, we encourage you to take your nausea medication as prescribed.   If you develop nausea and vomiting that is not controlled by your nausea medication, call the clinic.   BELOW ARE SYMPTOMS THAT SHOULD BE REPORTED IMMEDIATELY:  *FEVER GREATER THAN 100.5 F  *CHILLS WITH OR WITHOUT FEVER  NAUSEA AND VOMITING THAT IS NOT CONTROLLED WITH YOUR NAUSEA MEDICATION  *UNUSUAL SHORTNESS OF BREATH  *UNUSUAL BRUISING OR BLEEDING  TENDERNESS IN MOUTH AND THROAT WITH OR WITHOUT PRESENCE OF ULCERS  *URINARY PROBLEMS  *BOWEL PROBLEMS  UNUSUAL RASH Items with * indicate a potential emergency and should be followed up as soon as possible.  Feel free to call the clinic should you have any questions or concerns. The clinic phone number is [phone].  Please show the [facility] at check-in to the Emergency Department and triage nurse.

## 2018-05-01 NOTE — Progress Notes (Signed)
[place] [phone]   [phone]  OFFICE PROGRESS NOTE  [name], MD [address], [place] [address]  DIAGNOSIS: Metastatic non-small cell lung cancer initially diagnosed as stage IIIA (T2a, N2, M0) non-small cell lung cancer, poorly differentiated adenocarcinoma presented with left lower lobe lung mass in addition to mediastinal lymphadenopathy.  The patient was diagnosed with metastatic disease involving the left femur as well as left supraclavicular nodal metastases and right paratracheal lymphadenopathy in October 2018.  Biomarker Findings Microsatellite Status - MS-Stable Tumor Mutational Burden - TMB-Low (3 Muts/Mb) Genomic Findings For a complete list of the genes assayed, please refer to the Appendix. STK11 P235f*6 [id number] site [id number]>JDAXX E374* MLL2 [id number] NBN [id number] NOTCH2 [id number] splice site [id number]>RDisease relevant genes with no reportable alterations: EGFR, KRAS, ALK, BRAF, MET, RET, ERBB2, ROS1   PDL1 expression 5%  PRIOR THERAPY: 1) status post wedge resection of the left lower lobe lung mass as well as AP window lymph node dissection but there was residual metastatic mediastinal lymphadenopathy that could not be resected. 2) a course of concurrent chemorad[place] carboplatin [place]  3) status post palliative radiotherapy to the left femur metastatic bone disease. 4)  Systemic chemotherapy with carboplatin for AUC of 5, Alimta 500 mg/M2 and Keytruda 200 mg IV every 3 weeks.  First dose March 07, 2017.  Carboplatin was discontinued during cycle #2 secondary to hypersensitivity reaction. 5) status post 2 cycles of maintenance treatment with Alimta and Ketruda (pembrolizumab).  Alimta was discontinued secondary to intolerance.  CURRENT THERAPY: Maintenance treatment with single agent Ketruda (pembrolizumab) status post 15  cycles.  INTERVAL HISTORY: MicDAIN LASETER 68o. male returns to the clinic today for follow-up visit accompanied by his son.  The patient is feeling fine today with no concerning complaints.  The patient denied having any chest pain,  cough or hemoptysis.  He has more shortness of breath than before.  He denied having any fever or chills.  He has no nausea, vomiting, diarrhea or constipation.  He has no headache or visual changes.  The patient denied having any significant weight loss or night sweats.  He continues to tolerate his treatment with maintenance Keytruda fairly well.  He had repeat CT scan of the chest, abdomen and pelvis performed recently and is here for evaluation and discussion of his scan results.  MEDICAL HISTORY: Past Medical History:  Diagnosis Date  . Adenocarcinoma of left lung, stage 3 ([facility]/16/2018  . Arthritis   . Atrial fibrillation ([facility]/16/2018  . Atrial fibrillation ([facility] . COPD (chronic obstructive pulmonary disease) ([place]/16/2018  . History of chemotherapy   . History of radiation therapy   . Hyperlipidemia   . Hypothyroid 08/15/2016  . Longstanding persistent atrial fibrillation 08/29/2016  . Pathologic fracture    left femur  . Pneumonitis   . S/P TURP 08/15/2016  . Wears glasses   . Wears hearing aid in both ears     ALLERGIES:  is allergic to carboplatin and flecainide.  MEDICATIONS:  Current Outpatient Medications  Medication Sig Dispense Refill  . apixaban (ELIQUIS) 5 MG TABS tablet Take 1 tablet (5 mg total) by mouth 2 (two) times daily. 180 tablet 3  . atenolol (TENORMIN) 50 MG tablet Take 1 tablet (50 mg total) by mouth daily. 90 tablet 3  . atorvastatin (LIPITOR) 40 MG tablet Take 40 mg by  mouth daily.    . baclofen (LIORESAL) 10 MG tablet Take 1 tablet (10 mg total) by mouth 3 (three) times daily. As needed for muscle spasm (Patient not taking: Reported on 01/16/2018) 50 tablet 0  . citalopram (CELEXA) 40 MG tablet Take 40 mg by  mouth daily.    . clindamycin (CLINDAGEL) 1 % gel Apply topically 2 (two) times daily as needed. For rash on face (Patient not taking: Reported on 02/06/2018) 60 g 2  . ibuprofen (ADVIL,MOTRIN) 400 MG tablet Take 400 mg by mouth every 4 (four) hours as needed for moderate pain.    Marland Kitchen levothyroxine (SYNTHROID, LEVOTHROID) 150 MCG tablet Take 150 mcg by mouth daily.    Marland Kitchen LORazepam (ATIVAN) 0.5 MG tablet Take 1 tablet (0.5 mg total) by mouth every 8 (eight) hours as needed (nausea). (Patient not taking: Reported on 01/16/2018) 30 tablet 0  . ondansetron (ZOFRAN) 4 MG tablet Take 1 tablet (4 mg total) by mouth every 8 (eight) hours as needed for nausea or vomiting. (Patient not taking: Reported on 01/16/2018) 30 tablet 0  . oxyCODONE (ROXICODONE) 5 MG immediate release tablet Take 1-2 tablets (5-10 mg total) by mouth every 4 (four) hours as needed for severe pain. 50 tablet 0  . prochlorperazine (COMPAZINE) 10 MG tablet Take 1 tablet (10 mg total) by mouth every 6 (six) hours as needed for nausea or vomiting. (Patient not taking: Reported on 01/16/2018) 30 tablet 0  . sennosides-docusate sodium (SENOKOT-S) 8.6-50 MG tablet Take 2 tablets by mouth daily. (Patient not taking: Reported on 01/16/2018) 30 tablet 1  . tamsulosin (FLOMAX) 0.4 MG CAPS capsule Take 0.4 mg daily by mouth.     . zolpidem (AMBIEN) 5 MG tablet Take 5 mg by mouth at bedtime as needed for sleep.     No current facility-administered medications for this visit.    Facility-Administered Medications Ordered in Other Visits  Medication Dose Route Frequency Provider Last Rate Last Dose  . sodium chloride flush (NS) 0.9 % injection 10 mL  10 mL Intracatheter PRN Curt Bears, MD   10 mL at 05/01/18 0915    SURGICAL HISTORY:  Past Surgical History:  Procedure Laterality Date  . BRONCHOSCOPY  10/2014  . CARDIAC CATHETERIZATION     05/07/12  . CARDIOVERSION     x2  . COLONOSCOPY    . DG BIOPSY LUNG Left 10/2014   FNA -  Adenocarcinoma   . FEMUR IM NAIL Left 02/19/2017  . FEMUR IM NAIL Left 02/19/2017   Procedure: INTRAMEDULLARY (IM) NAIL FEMORAL;  Surgeon: Marchia Bond, MD;  Location: [facility];  Service: Orthopedics;  Laterality: Left;  . IR FLUORO GUIDE PORT INSERTION RIGHT  07/03/2017  . IR US GUIDE VASC ACCESS RIGHT  07/03/2017  . LUNG CANCER SURGERY Left 12/2014   Wedge Resection   . MULTIPLE TOOTH EXTRACTIONS    . Status post TURP    . TONSILLECTOMY      REVIEW OF SYSTEMS:  Constitutional: positive for fatigue Eyes: negative Ears, nose, mouth, throat, and face: negative Respiratory: positive for dyspnea on exertion Cardiovascular: negative Gastrointestinal: negative Genitourinary:negative Integument/breast: negative Hematologic/lymphatic: negative Musculoskeletal:negative Neurological: negative Behavioral/Psych: negative Endocrine: negative Allergic/Immunologic: negative   PHYSICAL EXAMINATION: General appearance: alert, cooperative, fatigued and no distress Head: Normocephalic, without obvious abnormality, atraumatic Neck: no adenopathy, no JVD, supple, symmetrical, trachea midline and thyroid not enlarged, symmetric, no tenderness/mass/nodules Lymph nodes: Cervical, supraclavicular, and axillary nodes normal. Resp: clear to auscultation bilaterally Back: symmetric, no curvature. ROM normal.  No CVA tenderness. Cardio: regular rate and rhythm, S1, S2 normal, no murmur, click, rub or gallop GI: soft, non-tender; bowel sounds normal; no masses,  no organomegaly Extremities: extremities normal, atraumatic, no cyanosis or edema Neurologic: Alert and oriented X 3, normal strength and tone. Normal symmetric reflexes. Normal coordination and gait  ECOG PERFORMANCE STATUS: 1 - Symptomatic but completely ambulatory  Blood pressure 106/82, pulse 86, temperature 97.6 F (36.4 C), temperature source Oral, resp. rate 18, height 6' 1"  (1.854 m), weight 243 lb 3.2 oz (110.3 kg), SpO2 97 %.  LABORATORY  DATA: Lab Results  Component Value Date   WBC 8.6 04/10/2018   HGB 15.8 04/10/2018   HCT 44.1 04/10/2018   MCV 94.2 04/10/2018   PLT 164 04/10/2018      Chemistry      Component Value Date/Time   NA 140 04/10/2018 0909   NA 136 04/04/2017 1141   K 4.4 04/10/2018 0909   K 4.4 04/04/2017 1141   CL 106 04/10/2018 0909   CO2 24 04/10/2018 0909   CO2 24 04/04/2017 1141   BUN 24 (H) 04/10/2018 0909   BUN 21.1 04/04/2017 1141   CREATININE 0.87 04/10/2018 0909   CREATININE 1.1 04/04/2017 1141      Component Value Date/Time   CALCIUM 9.2 04/10/2018 0909   CALCIUM 9.1 04/04/2017 1141   ALKPHOS 91 04/10/2018 0909   ALKPHOS 89 04/04/2017 1141   AST 20 04/10/2018 0909   AST 17 04/04/2017 1141   ALT 21 04/10/2018 0909   ALT 18 04/04/2017 1141   BILITOT 0.9 04/10/2018 0909   BILITOT 0.67 04/04/2017 1141       RADIOGRAPHIC STUDIES: Ct Chest W Contrast  Result Date: 04/29/2018 CLINICAL DATA:  Lung cancer restaging. Ongoing immunotherapy. Chronic cough and back pain. EXAM: CT CHEST, ABDOMEN, AND PELVIS WITH CONTRAST TECHNIQUE: Multidetector CT imaging of the chest, abdomen and pelvis was performed following the standard protocol during bolus administration of intravenous contrast. CONTRAST:  136m OMNIPAQUE IOHEXOL 300 MG/ML  SOLN COMPARISON:  Multiple exams, including 02/11/2018 FINDINGS: CT CHEST FINDINGS Cardiovascular: Coronary, aortic arch, and branch vessel atherosclerotic vascular disease. Small pericardial effusion similar to prior. Right heart enlargement. Mediastinum/Nodes: Severe thyroid atrophy. A right upper paratracheal node measures 1.4 cm in short axis on image 16/2, previously 1.1 cm. A lower right paratracheal node measures 1.6 cm in short axis on image 23/2, formerly 1.5 cm by my measurement. A subcarinal lymph node measures 1.6 cm in short axis on image 31/2, formerly 1.4 cm by my measurement. Lungs/Pleura: Similar appearance of wedge resection clips in the left lower  lobe with surrounding consolidation as on image 76/7 overall the pattern and appearance of consolidation/airspace opacity in this vicinity is similar to prior. There is some left perihilar volume loss unchanged from prior. Much of this likely represents radiation therapy related findings. Severe emphysema. Peripheral interstitial accentuation of both lungs probably from fibrosis. Confluent scarring and confluent areas of interstitial accentuation and some ground-glass opacities scattered in both lungs. Some of the nodular opacity previously shown in the right middle lobe is less confluent, probably from improved inflammatory process. Similarly, some of the nodularity in the left lower lobe for example medially on image 73/7 is significantly less dense compared the prior exam and hence felt to be improved. Similar appearance along the lingula. Stable trace left pleural effusion. Musculoskeletal: Nonunited left seventh rib fracture. Thoracic spondylosis. CT ABDOMEN PELVIS FINDINGS Hepatobiliary: Stable 5 mm hypodense lesion in segment 2 of the  liver, highly likely to be a benign cyst or similar benign lesion. Multiple gallstones. Otherwise unremarkable. Pancreas: Unremarkable Spleen: Unremarkable Adrenals/Urinary Tract: Small bilateral hypodense renal lesions are compatible with cysts. Subtle nodularity of the medial limb of the right adrenal gland without overt mass. Stomach/Bowel: Prominent stool throughout the colon favors constipation. Vascular/Lymphatic: Aortoiliac atherosclerotic vascular disease. Reproductive: Mild prostatomegaly. Prior transurethral resection of the prostate. Other: No supplemental non-categorized findings. Musculoskeletal: Left hip screw. Multilevel lumbar degenerative disc disease and spondylosis causing foraminal impingement on the right at L2-3 and on the left at L4-5. A small supraumbilical hernia contains adipose tissue. Fatty spermatic cords bilaterally. IMPRESSION: 1. Stable appearance  of the left perihilar and infrahilar consolidation tracking along the wedge resection site, most of which likely from prior radiation therapy. 2. Emphysema along with peripheral and scattered fibrosis in the lungs. Some of the nodularity involving the more confluent areas shown on the 02/11/2018 exam have significantly improved or reduced in solidity, suggesting a inflammatory etiology, with multifocal neoplastic response to therapy a less likely differential diagnostic consideration. 3. Other imaging findings of potential clinical significance: Aortic Atherosclerosis (ICD10-I70.0). Coronary atherosclerosis. Stable small pericardial effusion. Stable trace left pleural effusion. Right heart enlargement. Thyroid atrophy. Cholelithiasis. Prominent stool throughout the colon favors constipation. Mild prostatomegaly. Lumbar impingement at L2-3 and L4-5. Small supraumbilical hernia contains adipose tissue. Emphysema (ICD10-J43.9). Electronically Signed   By: Van Clines M.D.   On: 04/29/2018 08:56   Ct Abdomen Pelvis W Contrast  Result Date: 04/29/2018 CLINICAL DATA:  Lung cancer restaging. Ongoing immunotherapy. Chronic cough and back pain. EXAM: CT CHEST, ABDOMEN, AND PELVIS WITH CONTRAST TECHNIQUE: Multidetector CT imaging of the chest, abdomen and pelvis was performed following the standard protocol during bolus administration of intravenous contrast. CONTRAST:  137m OMNIPAQUE IOHEXOL 300 MG/ML  SOLN COMPARISON:  Multiple exams, including 02/11/2018 FINDINGS: CT CHEST FINDINGS Cardiovascular: Coronary, aortic arch, and branch vessel atherosclerotic vascular disease. Small pericardial effusion similar to prior. Right heart enlargement. Mediastinum/Nodes: Severe thyroid atrophy. A right upper paratracheal node measures 1.4 cm in short axis on image 16/2, previously 1.1 cm. A lower right paratracheal node measures 1.6 cm in short axis on image 23/2, formerly 1.5 cm by my measurement. A subcarinal lymph node  measures 1.6 cm in short axis on image 31/2, formerly 1.4 cm by my measurement. Lungs/Pleura: Similar appearance of wedge resection clips in the left lower lobe with surrounding consolidation as on image 76/7 overall the pattern and appearance of consolidation/airspace opacity in this vicinity is similar to prior. There is some left perihilar volume loss unchanged from prior. Much of this likely represents radiation therapy related findings. Severe emphysema. Peripheral interstitial accentuation of both lungs probably from fibrosis. Confluent scarring and confluent areas of interstitial accentuation and some ground-glass opacities scattered in both lungs. Some of the nodular opacity previously shown in the right middle lobe is less confluent, probably from improved inflammatory process. Similarly, some of the nodularity in the left lower lobe for example medially on image 73/7 is significantly less dense compared the prior exam and hence felt to be improved. Similar appearance along the lingula. Stable trace left pleural effusion. Musculoskeletal: Nonunited left seventh rib fracture. Thoracic spondylosis. CT ABDOMEN PELVIS FINDINGS Hepatobiliary: Stable 5 mm hypodense lesion in segment 2 of the liver, highly likely to be a benign cyst or similar benign lesion. Multiple gallstones. Otherwise unremarkable. Pancreas: Unremarkable Spleen: Unremarkable Adrenals/Urinary Tract: Small bilateral hypodense renal lesions are compatible with cysts. Subtle nodularity of the medial limb  of the right adrenal gland without overt mass. Stomach/Bowel: Prominent stool throughout the colon favors constipation. Vascular/Lymphatic: Aortoiliac atherosclerotic vascular disease. Reproductive: Mild prostatomegaly. Prior transurethral resection of the prostate. Other: No supplemental non-categorized findings. Musculoskeletal: Left hip screw. Multilevel lumbar degenerative disc disease and spondylosis causing foraminal impingement on the right  at L2-3 and on the left at L4-5. A small supraumbilical hernia contains adipose tissue. Fatty spermatic cords bilaterally. IMPRESSION: 1. Stable appearance of the left perihilar and infrahilar consolidation tracking along the wedge resection site, most of which likely from prior radiation therapy. 2. Emphysema along with peripheral and scattered fibrosis in the lungs. Some of the nodularity involving the more confluent areas shown on the 02/11/2018 exam have significantly improved or reduced in solidity, suggesting a inflammatory etiology, with multifocal neoplastic response to therapy a less likely differential diagnostic consideration. 3. Other imaging findings of potential clinical significance: Aortic Atherosclerosis (ICD10-I70.0). Coronary atherosclerosis. Stable small pericardial effusion. Stable trace left pleural effusion. Right heart enlargement. Thyroid atrophy. Cholelithiasis. Prominent stool throughout the colon favors constipation. Mild prostatomegaly. Lumbar impingement at L2-3 and L4-5. Small supraumbilical hernia contains adipose tissue. Emphysema (ICD10-J43.9). Electronically Signed   By: Van Clines M.D.   On: 04/29/2018 08:56    ASSESSMENT AND PLAN: This is a 68 years old white male with metastatic non-small cell lung cancer, adenocarcinoma with no actionable mutations and PDL 1 expression of 5% that was initially diagnosed as stage IIIa non-small cell lung cancer, adenocarcinoma status post left lower lobectomy with lymph node dissection followed by a course of concurrent chemoradiation completed in January 2016.  The patient had evidence for disease metastasis in October 2018 with metastatic disease to the left femur as well as left supraclavicular and right paratracheal lymph nodes. The patient is currently on systemic chemotherapy initially was with carboplatin, Alimta and Keytruda.  Carboplatin was discontinued secondary to hypersensitivity reaction starting from cycle #2.   He  was also treated with 3 cycles of maintenance Alimta and Ketruda (pembrolizumab) but Alimta was discontinued secondary to intolerance. He is currently undergoing treatment with maintenance Keytruda as a single agent status post 15 cycles.  The patient continues to tolerate his treatment well with no concerning adverse effects. He had repeat CT scan of the chest, abdomen and pelvis performed recently.  I personally and independently reviewed the scan images and discussed the results with the patient and his son. His scan showed no concerning findings for disease progression but there was a slight increase in some of the mediastinal lymph nodes that we will continue to monitor closely on the upcoming scans. I recommended for the patient to continue his current treatment with [facility] and he will proceed with cycle #15 of the maintenance therapy today. The patient will come back for follow-up visit in 3 weeks for evaluation before starting the next cycle of his treatment. For the hypothyroidism, we will continue to monitor his TSH closely and adjust the dose of levothyroxine as needed. The patient was advised to call immediately if he has any concerning symptoms in the interval. The patient voices understanding of current disease status and treatment options and is in agreement with the current care plan. All questions were answered. The patient knows to call the clinic with any problems, questions or concerns. We can certainly see the patient much sooner if necessary.  Disclaimer: This note was dictated with voice recognition software. Similar sounding words can inadvertently be transcribed and may not be corrected upon review.

## 2018-05-02 ENCOUNTER — Telehealth: Payer: Self-pay | Admitting: Internal Medicine

## 2018-05-02 NOTE — Telephone Encounter (Signed)
Scheduled appt per 1/30 los - added additional cycles - pt to get an updated schedule next visit.

## 2018-05-05 ENCOUNTER — Telehealth: Payer: Self-pay

## 2018-05-05 NOTE — Telephone Encounter (Signed)
Per 1/30 no los

## 2018-05-22 ENCOUNTER — Inpatient Hospital Stay: Payer: Medicare HMO

## 2018-05-22 ENCOUNTER — Encounter: Payer: Self-pay | Admitting: Internal Medicine

## 2018-05-22 ENCOUNTER — Other Ambulatory Visit: Payer: Self-pay

## 2018-05-22 ENCOUNTER — Inpatient Hospital Stay (HOSPITAL_BASED_OUTPATIENT_CLINIC_OR_DEPARTMENT_OTHER): Payer: Medicare HMO | Admitting: Internal Medicine

## 2018-05-22 ENCOUNTER — Inpatient Hospital Stay: Payer: Medicare HMO | Attending: Internal Medicine

## 2018-05-22 VITALS — BP 125/71 | HR 89 | Temp 97.5°F | Resp 20 | Ht 73.0 in | Wt 249.9 lb

## 2018-05-22 DIAGNOSIS — Z79899 Other long term (current) drug therapy: Secondary | ICD-10-CM

## 2018-05-22 DIAGNOSIS — C3432 Malignant neoplasm of lower lobe, left bronchus or lung: Secondary | ICD-10-CM | POA: Diagnosis not present

## 2018-05-22 DIAGNOSIS — Z902 Acquired absence of lung [part of]: Secondary | ICD-10-CM | POA: Diagnosis not present

## 2018-05-22 DIAGNOSIS — C7951 Secondary malignant neoplasm of bone: Secondary | ICD-10-CM | POA: Diagnosis not present

## 2018-05-22 DIAGNOSIS — C771 Secondary and unspecified malignant neoplasm of intrathoracic lymph nodes: Secondary | ICD-10-CM | POA: Diagnosis not present

## 2018-05-22 DIAGNOSIS — Z9221 Personal history of antineoplastic chemotherapy: Secondary | ICD-10-CM | POA: Insufficient documentation

## 2018-05-22 DIAGNOSIS — E039 Hypothyroidism, unspecified: Secondary | ICD-10-CM | POA: Diagnosis not present

## 2018-05-22 DIAGNOSIS — J449 Chronic obstructive pulmonary disease, unspecified: Secondary | ICD-10-CM | POA: Diagnosis not present

## 2018-05-22 DIAGNOSIS — Z5112 Encounter for antineoplastic immunotherapy: Secondary | ICD-10-CM

## 2018-05-22 DIAGNOSIS — Z923 Personal history of irradiation: Secondary | ICD-10-CM | POA: Insufficient documentation

## 2018-05-22 DIAGNOSIS — C3492 Malignant neoplasm of unspecified part of left bronchus or lung: Secondary | ICD-10-CM

## 2018-05-22 DIAGNOSIS — Z95828 Presence of other vascular implants and grafts: Secondary | ICD-10-CM

## 2018-05-22 LAB — TSH: TSH: 3.809 u[IU]/mL (ref 0.320–4.118)

## 2018-05-22 LAB — CMP (CANCER CENTER ONLY)
ALK PHOS: 88 U/L (ref 38–126)
ALT: 24 U/L (ref 0–44)
AST: 25 U/L (ref 15–41)
Albumin: 4 g/dL (ref 3.5–5.0)
Anion gap: 10 (ref 5–15)
BILIRUBIN TOTAL: 1 mg/dL (ref 0.3–1.2)
BUN: 18 mg/dL (ref 8–23)
CHLORIDE: 105 mmol/L (ref 98–111)
CO2: 23 mmol/L (ref 22–32)
Calcium: 9.1 mg/dL (ref 8.9–10.3)
Creatinine: 1.04 mg/dL (ref 0.61–1.24)
GFR, Est AFR Am: >60 mL/min (ref >60)
GFR, Estimated: >60 mL/min (ref >60)
Glucose, Bld: 134 mg/dL — ABNORMAL HIGH (ref 70–99)
POTASSIUM: 4.5 mmol/L (ref 3.5–5.1)
Sodium: 138 mmol/L (ref 135–145)
Total Protein: 6.6 g/dL (ref 6.5–8.1)

## 2018-05-22 LAB — CBC WITH DIFFERENTIAL (CANCER CENTER ONLY)
Abs Immature Granulocytes: 0.01 10*3/uL (ref 0.00–0.07)
Basophils Absolute: 0 10*3/uL (ref 0.0–0.1)
Basophils Relative: 0 %
Eosinophils Absolute: 0.1 10*3/uL (ref 0.0–0.5)
Eosinophils Relative: 1 %
HEMATOCRIT: 44.9 % (ref 39.0–52.0)
Hemoglobin: 15.7 g/dL (ref 13.0–17.0)
Immature Granulocytes: 0 %
LYMPHS ABS: 1.3 10*3/uL (ref 0.7–4.0)
Lymphocytes Relative: 20 %
MCH: 33.7 pg (ref 26.0–34.0)
MCHC: 35 g/dL (ref 30.0–36.0)
MCV: 96.4 fL (ref 80.0–100.0)
Monocytes Absolute: 0.5 10*3/uL (ref 0.1–1.0)
Monocytes Relative: 8 %
Neutro Abs: 4.4 10*3/uL (ref 1.7–7.7)
Neutrophils Relative %: 71 %
Platelet Count: 160 10*3/uL (ref 150–400)
RBC: 4.66 MIL/uL (ref 4.22–5.81)
RDW: 13.6 % (ref 11.5–15.5)
WBC Count: 6.3 10*3/uL (ref 4.0–10.5)
nRBC: 0 % (ref 0.0–0.2)

## 2018-05-22 MED ORDER — SODIUM CHLORIDE 0.9 % IV SOLN
200.0000 mg | Freq: Once | INTRAVENOUS | Status: AC
  Administered 2018-05-22: 200 mg via INTRAVENOUS
  Filled 2018-05-22: qty 8

## 2018-05-22 MED ORDER — HEPARIN SOD (PORK) LOCK FLUSH 100 UNIT/ML IV SOLN
500.0000 [IU] | Freq: Once | INTRAVENOUS | Status: AC | PRN
  Administered 2018-05-22: 500 [IU]
  Filled 2018-05-22: qty 5

## 2018-05-22 MED ORDER — SODIUM CHLORIDE 0.9% FLUSH
10.0000 mL | INTRAVENOUS | Status: DC | PRN
  Administered 2018-05-22: 10 mL
  Filled 2018-05-22: qty 10

## 2018-05-22 MED ORDER — ALTEPLASE 2 MG IJ SOLR
2.0000 mg | Freq: Once | INTRAMUSCULAR | Status: AC | PRN
  Administered 2018-05-22: 2 mg
  Filled 2018-05-22: qty 2

## 2018-05-22 MED ORDER — SODIUM CHLORIDE 0.9 % IV SOLN
Freq: Once | INTRAVENOUS | Status: AC
  Administered 2018-05-22: 13:00:00 via INTRAVENOUS
  Filled 2018-05-22: qty 250

## 2018-05-22 MED ORDER — ALTEPLASE 2 MG IJ SOLR
INTRAMUSCULAR | Status: AC
  Filled 2018-05-22: qty 2

## 2018-05-22 NOTE — Progress Notes (Signed)
[facility] [phone]   [phone]  OFFICE PROGRESS NOTE  [name], MD [address], [place] [address]  DIAGNOSIS: Metastatic non-small cell lung cancer initially diagnosed as stage IIIA (T2a, N2, M0) non-small cell lung cancer, poorly differentiated adenocarcinoma presented with left lower lobe lung mass in addition to mediastinal lymphadenopathy.  The patient was diagnosed with metastatic disease involving the left femur as well as left supraclavicular nodal metastases and right paratracheal lymphadenopathy in October 2018.  Biomarker Findings Microsatellite Status - MS-Stable Tumor Mutational Burden - TMB-Low (3 Muts/Mb) Genomic Findings For a complete list of the genes assayed, please refer to the Appendix. STK11 P256f*6 [id number] site 1606+7P>CDAXX E374* MLL2 [id number] NBN K23383f NOTCH2 [id number] splice site [id number]+8H>TDisease relevant genes with no reportable alterations: EGFR, KRAS, ALK, BRAF, MET, RET, ERBB2, ROS1   PDL1 expression 5%  PRIOR THERAPY: 1) status post wedge resection of the left lower lobe lung mass as well as AP window lymph node dissection but there was residual metastatic mediastinal lymphadenopathy that could not be resected. 2) a course of concurrent chemorad[place] carbopl[place]  3) status post palliative radiotherapy to the left femur metastatic bone disease. 4)  Systemic chemotherapy with carboplatin for AUC of 5, Alimta 500 mg/M2 and Keytruda 200 mg IV every 3 weeks.  First dose March 07, 2017.  Carboplatin was discontinued during cycle #2 secondary to hypersensitivity reaction. 5) status post 2 cycles of maintenance treatment with Alimta and Ketruda (pembrolizumab).  Alimta was discontinued secondary to intolerance.  CURRENT THERAPY: Maintenance treatment with single agent Ketruda (pembrolizumab) status post 16  cycles.  INTERVAL HISTORY: Victor Huber 74o. male returns to the clinic today for follow-up visit accompanied by his son.  The patient is feeling fine today with no concerning complaints except for mild fatigue.  He continues to tolerate his treatment with Keytruda fairly well.  The patient denied having any chest pain, shortness of breath, cough or hemoptysis.  He denied having any fever or chills.  He has no nausea, vomiting, diarrhea or constipation.  He is here today for evaluation before starting cycle #17.  MEDICAL HISTORY: Past Medical History:  Diagnosis Date  . Adenocarcinoma of left lung, stage 3 ([place]/16/[place]  . Arthritis   . Atrial [place] ([place]16/[place]  . [place]rial fibrillation ([place] COPD (chronic obstructive pulmonary disease) ([place]/16/2018  . History of chemotherapy   . History of radiation therapy   . Hyperlipidemia   . Hypothyroid 08/15/2016  . Longstanding persistent atrial fibrillation 08/29/2016  . Pathologic fracture    left femur  . Pneumonitis   . S/P TURP 08/15/2016  . Wears glasses   . Wears hearing aid in both ears     ALLERGIES:  is allergic to carboplatin and flecainide.  MEDICATIONS:  Current Outpatient Medications  Medication Sig Dispense Refill  . apixaban (ELIQUIS) 5 MG TABS tablet Take 1 tablet (5 mg total) by mouth 2 (two) times daily. 180 tablet 3  . atenolol (TENORMIN) 50 MG tablet Take 1 tablet (50 mg total) by mouth daily. 90 tablet 3  . atorvastatin (LIPITOR) 40 MG tablet Take 40 mg by mouth daily.    . citalopram (CELEXA) 40 MG tablet Take 40 mg by mouth daily.    . iMarland Kitchenuprofen (ADVIL,MOTRIN) 400 MG tablet Take 400 mg by mouth every 4 (four) hours as needed for  moderate pain.    Marland Kitchen levothyroxine (SYNTHROID, LEVOTHROID) 150 MCG tablet Take 150 mcg by mouth daily.    . tamsulosin (FLOMAX) 0.4 MG CAPS capsule Take 0.4 mg daily by mouth.      No current facility-administered medications for this visit.     SURGICAL HISTORY:    Past Surgical History:  Procedure Laterality Date  . BRONCHOSCOPY  10/2014  . CARDIAC CATHETERIZATION     05/07/12  . CARDIOVERSION     x2  . COLONOSCOPY    . DG BIOPSY LUNG Left 10/2014   FNA - Adenocarcinoma   . FEMUR IM NAIL Left 02/19/2017  . FEMUR IM NAIL Left 02/19/2017   Procedure: INTRAMEDULLARY (IM) NAIL FEMORAL;  Surgeon: Marchia Bond, MD;  Location: [facility];  Service: Orthopedics;  Laterality: Left;  . IR FLUORO GUIDE PORT INSERTION RIGHT  07/03/2017  . IR US GUIDE VASC ACCESS RIGHT  07/03/2017  . LUNG CANCER SURGERY Left 12/2014   Wedge Resection   . MULTIPLE TOOTH EXTRACTIONS    . Status post TURP    . TONSILLECTOMY      REVIEW OF SYSTEMS:  A comprehensive review of systems was negative except for: Constitutional: positive for fatigue   PHYSICAL EXAMINATION: General appearance: alert, cooperative, fatigued and no distress Head: Normocephalic, without obvious abnormality, atraumatic Neck: no adenopathy, no JVD, supple, symmetrical, trachea midline and thyroid not enlarged, symmetric, no tenderness/mass/nodules Lymph nodes: Cervical, supraclavicular, and axillary nodes normal. Resp: clear to auscultation bilaterally Back: symmetric, no curvature. ROM normal. No CVA tenderness. Cardio: regular rate and rhythm, S1, S2 normal, no murmur, click, rub or gallop GI: soft, non-tender; bowel sounds normal; no masses,  no organomegaly Extremities: extremities normal, atraumatic, no cyanosis or edema  ECOG PERFORMANCE STATUS: 1 - Symptomatic but completely ambulatory  Blood pressure 125/71, pulse 89, temperature (!) 97.5 F (36.4 C), temperature source Oral, resp. rate 20, height '6\' 1"'$  (1.854 m), weight 249 lb 14.4 oz (113.4 kg), SpO2 96 %.  LABORATORY DATA: Lab Results  Component Value Date   WBC 6.3 05/22/2018   HGB 15.7 05/22/2018   HCT 44.9 05/22/2018   MCV 96.4 05/22/2018   PLT 160 05/22/2018      Chemistry      Component Value Date/Time   NA 138 05/22/2018 1000    NA 136 04/04/2017 1141   K 4.5 05/22/2018 1000   K 4.4 04/04/2017 1141   CL 105 05/22/2018 1000   CO2 23 05/22/2018 1000   CO2 24 04/04/2017 1141   BUN 18 05/22/2018 1000   BUN 21.1 04/04/2017 1141   CREATININE 1.04 05/22/2018 1000   CREATININE 1.1 04/04/2017 1141      Component Value Date/Time   CALCIUM 9.1 05/22/2018 1000   CALCIUM 9.1 04/04/2017 1141   ALKPHOS 88 05/22/2018 1000   ALKPHOS 89 04/04/2017 1141   AST 25 05/22/2018 1000   AST 17 04/04/2017 1141   ALT 24 05/22/2018 1000   ALT 18 04/04/2017 1141   BILITOT 1.0 05/22/2018 1000   BILITOT 0.67 04/04/2017 1141       RADIOGRAPHIC STUDIES: Ct Chest W Contrast  Result Date: 04/29/2018 CLINICAL DATA:  Lung cancer restaging. Ongoing immunotherapy. Chronic cough and back pain. EXAM: CT CHEST, ABDOMEN, AND PELVIS WITH CONTRAST TECHNIQUE: Multidetector CT imaging of the chest, abdomen and pelvis was performed following the standard protocol during bolus administration of intravenous contrast. CONTRAST:  183m OMNIPAQUE IOHEXOL 300 MG/ML  SOLN COMPARISON:  Multiple exams, including 02/11/2018 FINDINGS: CT  CHEST FINDINGS Cardiovascular: Coronary, aortic arch, and branch vessel atherosclerotic vascular disease. Small pericardial effusion similar to prior. Right heart enlargement. Mediastinum/Nodes: Severe thyroid atrophy. A right upper paratracheal node measures 1.4 cm in short axis on image 16/2, previously 1.1 cm. A lower right paratracheal node measures 1.6 cm in short axis on image 23/2, formerly 1.5 cm by my measurement. A subcarinal lymph node measures 1.6 cm in short axis on image 31/2, formerly 1.4 cm by my measurement. Lungs/Pleura: Similar appearance of wedge resection clips in the left lower lobe with surrounding consolidation as on image 76/7 overall the pattern and appearance of consolidation/airspace opacity in this vicinity is similar to prior. There is some left perihilar volume loss unchanged from prior. Much of this  likely represents radiation therapy related findings. Severe emphysema. Peripheral interstitial accentuation of both lungs probably from fibrosis. Confluent scarring and confluent areas of interstitial accentuation and some ground-glass opacities scattered in both lungs. Some of the nodular opacity previously shown in the right middle lobe is less confluent, probably from improved inflammatory process. Similarly, some of the nodularity in the left lower lobe for example medially on image 73/7 is significantly less dense compared the prior exam and hence felt to be improved. Similar appearance along the lingula. Stable trace left pleural effusion. Musculoskeletal: Nonunited left seventh rib fracture. Thoracic spondylosis. CT ABDOMEN PELVIS FINDINGS Hepatobiliary: Stable 5 mm hypodense lesion in segment 2 of the liver, highly likely to be a benign cyst or similar benign lesion. Multiple gallstones. Otherwise unremarkable. Pancreas: Unremarkable Spleen: Unremarkable Adrenals/Urinary Tract: Small bilateral hypodense renal lesions are compatible with cysts. Subtle nodularity of the medial limb of the right adrenal gland without overt mass. Stomach/Bowel: Prominent stool throughout the colon favors constipation. Vascular/Lymphatic: Aortoiliac atherosclerotic vascular disease. Reproductive: Mild prostatomegaly. Prior transurethral resection of the prostate. Other: No supplemental non-categorized findings. Musculoskeletal: Left hip screw. Multilevel lumbar degenerative disc disease and spondylosis causing foraminal impingement on the right at L2-3 and on the left at L4-5. A small supraumbilical hernia contains adipose tissue. Fatty spermatic cords bilaterally. IMPRESSION: 1. Stable appearance of the left perihilar and infrahilar consolidation tracking along the wedge resection site, most of which likely from prior radiation therapy. 2. Emphysema along with peripheral and scattered fibrosis in the lungs. Some of the  nodularity involving the more confluent areas shown on the 02/11/2018 exam have significantly improved or reduced in solidity, suggesting a inflammatory etiology, with multifocal neoplastic response to therapy a less likely differential diagnostic consideration. 3. Other imaging findings of potential clinical significance: Aortic Atherosclerosis (ICD10-I70.0). Coronary atherosclerosis. Stable small pericardial effusion. Stable trace left pleural effusion. Right heart enlargement. Thyroid atrophy. Cholelithiasis. Prominent stool throughout the colon favors constipation. Mild prostatomegaly. Lumbar impingement at L2-3 and L4-5. Small supraumbilical hernia contains adipose tissue. Emphysema (ICD10-J43.9). Electronically Signed   By: Van Clines M.D.   On: 04/29/2018 08:56   Ct Abdomen Pelvis W Contrast  Result Date: 04/29/2018 CLINICAL DATA:  Lung cancer restaging. Ongoing immunotherapy. Chronic cough and back pain. EXAM: CT CHEST, ABDOMEN, AND PELVIS WITH CONTRAST TECHNIQUE: Multidetector CT imaging of the chest, abdomen and pelvis was performed following the standard protocol during bolus administration of intravenous contrast. CONTRAST:  128m OMNIPAQUE IOHEXOL 300 MG/ML  SOLN COMPARISON:  Multiple exams, including 02/11/2018 FINDINGS: CT CHEST FINDINGS Cardiovascular: Coronary, aortic arch, and branch vessel atherosclerotic vascular disease. Small pericardial effusion similar to prior. Right heart enlargement. Mediastinum/Nodes: Severe thyroid atrophy. A right upper paratracheal node measures 1.4 cm in short axis on  image 16/2, previously 1.1 cm. A lower right paratracheal node measures 1.6 cm in short axis on image 23/2, formerly 1.5 cm by my measurement. A subcarinal lymph node measures 1.6 cm in short axis on image 31/2, formerly 1.4 cm by my measurement. Lungs/Pleura: Similar appearance of wedge resection clips in the left lower lobe with surrounding consolidation as on image 76/7 overall the pattern  and appearance of consolidation/airspace opacity in this vicinity is similar to prior. There is some left perihilar volume loss unchanged from prior. Much of this likely represents radiation therapy related findings. Severe emphysema. Peripheral interstitial accentuation of both lungs probably from fibrosis. Confluent scarring and confluent areas of interstitial accentuation and some ground-glass opacities scattered in both lungs. Some of the nodular opacity previously shown in the right middle lobe is less confluent, probably from improved inflammatory process. Similarly, some of the nodularity in the left lower lobe for example medially on image 73/7 is significantly less dense compared the prior exam and hence felt to be improved. Similar appearance along the lingula. Stable trace left pleural effusion. Musculoskeletal: Nonunited left seventh rib fracture. Thoracic spondylosis. CT ABDOMEN PELVIS FINDINGS Hepatobiliary: Stable 5 mm hypodense lesion in segment 2 of the liver, highly likely to be a benign cyst or similar benign lesion. Multiple gallstones. Otherwise unremarkable. Pancreas: Unremarkable Spleen: Unremarkable Adrenals/Urinary Tract: Small bilateral hypodense renal lesions are compatible with cysts. Subtle nodularity of the medial limb of the right adrenal gland without overt mass. Stomach/Bowel: Prominent stool throughout the colon favors constipation. Vascular/Lymphatic: Aortoiliac atherosclerotic vascular disease. Reproductive: Mild prostatomegaly. Prior transurethral resection of the prostate. Other: No supplemental non-categorized findings. Musculoskeletal: Left hip screw. Multilevel lumbar degenerative disc disease and spondylosis causing foraminal impingement on the right at L2-3 and on the left at L4-5. A small supraumbilical hernia contains adipose tissue. Fatty spermatic cords bilaterally. IMPRESSION: 1. Stable appearance of the left perihilar and infrahilar consolidation tracking along the  wedge resection site, most of which likely from prior radiation therapy. 2. Emphysema along with peripheral and scattered fibrosis in the lungs. Some of the nodularity involving the more confluent areas shown on the 02/11/2018 exam have significantly improved or reduced in solidity, suggesting a inflammatory etiology, with multifocal neoplastic response to therapy a less likely differential diagnostic consideration. 3. Other imaging findings of potential clinical significance: Aortic Atherosclerosis (ICD10-I70.0). Coronary atherosclerosis. Stable small pericardial effusion. Stable trace left pleural effusion. Right heart enlargement. Thyroid atrophy. Cholelithiasis. Prominent stool throughout the colon favors constipation. Mild prostatomegaly. Lumbar impingement at L2-3 and L4-5. Small supraumbilical hernia contains adipose tissue. Emphysema (ICD10-J43.9). Electronically Signed   By: Van Clines M.D.   On: 04/29/2018 08:56    ASSESSMENT AND PLAN: This is a 68 years old white male with metastatic non-small cell lung cancer, adenocarcinoma with no actionable mutations and PDL 1 expression of 5% that was initially diagnosed as stage IIIa non-small cell lung cancer, adenocarcinoma status post left lower lobectomy with lymph node dissection followed by a course of concurrent chemoradiation completed in January 2016.  The patient had evidence for disease metastasis in October 2018 with metastatic disease to the left femur as well as left supraclavicular and right paratracheal lymph nodes. The patient is currently on systemic chemotherapy initially was with carboplatin, Alimta and Keytruda.  Carboplatin was discontinued secondary to hypersensitivity reaction starting from cycle #2.   He was also treated with 3 cycles of maintenance Alimta and Ketruda (pembrolizumab) but Alimta was discontinued secondary to intolerance. He is currently undergoing treatment with  maintenance Keytruda as a single agent status post  16 cycles.  The patient continues to tolerate his treatment well with no concerning adverse effects. I recommended for him to proceed with cycle #17 today as scheduled. I will see him back for follow-up visit in 3 weeks for evaluation before the next cycle of his treatment. The patient voices understanding of current disease status and treatment options and is in agreement with the current care plan. All questions were answered. The patient knows to call the clinic with any problems, questions or concerns. We can certainly see the patient much sooner if necessary.  Disclaimer: This note was dictated with voice recognition software. Similar sounding words can inadvertently be transcribed and may not be corrected upon review.

## 2018-06-11 ENCOUNTER — Other Ambulatory Visit: Payer: Self-pay | Admitting: Medical Oncology

## 2018-06-11 DIAGNOSIS — C3492 Malignant neoplasm of unspecified part of left bronchus or lung: Secondary | ICD-10-CM

## 2018-06-12 ENCOUNTER — Inpatient Hospital Stay: Payer: Medicare HMO

## 2018-06-12 ENCOUNTER — Other Ambulatory Visit: Payer: Self-pay

## 2018-06-12 ENCOUNTER — Encounter: Payer: Self-pay | Admitting: Internal Medicine

## 2018-06-12 ENCOUNTER — Inpatient Hospital Stay (HOSPITAL_BASED_OUTPATIENT_CLINIC_OR_DEPARTMENT_OTHER): Payer: Medicare HMO | Admitting: Internal Medicine

## 2018-06-12 ENCOUNTER — Inpatient Hospital Stay: Payer: Medicare HMO | Attending: Internal Medicine

## 2018-06-12 VITALS — BP 121/73 | HR 91 | Temp 98.0°F | Resp 18 | Ht 73.0 in | Wt 249.8 lb

## 2018-06-12 DIAGNOSIS — I4891 Unspecified atrial fibrillation: Secondary | ICD-10-CM | POA: Diagnosis not present

## 2018-06-12 DIAGNOSIS — C3492 Malignant neoplasm of unspecified part of left bronchus or lung: Secondary | ICD-10-CM

## 2018-06-12 DIAGNOSIS — C3432 Malignant neoplasm of lower lobe, left bronchus or lung: Secondary | ICD-10-CM | POA: Diagnosis present

## 2018-06-12 DIAGNOSIS — Z5112 Encounter for antineoplastic immunotherapy: Secondary | ICD-10-CM | POA: Diagnosis present

## 2018-06-12 DIAGNOSIS — E785 Hyperlipidemia, unspecified: Secondary | ICD-10-CM | POA: Diagnosis not present

## 2018-06-12 DIAGNOSIS — Z902 Acquired absence of lung [part of]: Secondary | ICD-10-CM

## 2018-06-12 DIAGNOSIS — J449 Chronic obstructive pulmonary disease, unspecified: Secondary | ICD-10-CM

## 2018-06-12 DIAGNOSIS — C7951 Secondary malignant neoplasm of bone: Secondary | ICD-10-CM

## 2018-06-12 DIAGNOSIS — Z7901 Long term (current) use of anticoagulants: Secondary | ICD-10-CM | POA: Diagnosis not present

## 2018-06-12 DIAGNOSIS — Z79899 Other long term (current) drug therapy: Secondary | ICD-10-CM

## 2018-06-12 DIAGNOSIS — C3490 Malignant neoplasm of unspecified part of unspecified bronchus or lung: Secondary | ICD-10-CM

## 2018-06-12 DIAGNOSIS — Z791 Long term (current) use of non-steroidal anti-inflammatories (NSAID): Secondary | ICD-10-CM | POA: Insufficient documentation

## 2018-06-12 LAB — CMP (CANCER CENTER ONLY)
ALT: 21 U/L (ref 0–44)
AST: 24 U/L (ref 15–41)
Albumin: 4.1 g/dL (ref 3.5–5.0)
Alkaline Phosphatase: 93 U/L (ref 38–126)
Anion gap: 10 (ref 5–15)
BUN: 17 mg/dL (ref 8–23)
CO2: 23 mmol/L (ref 22–32)
Calcium: 9.1 mg/dL (ref 8.9–10.3)
Chloride: 106 mmol/L (ref 98–111)
Creatinine: 0.91 mg/dL (ref 0.61–1.24)
GFR, Est AFR Am: >60 mL/min (ref >60)
Glucose, Bld: 110 mg/dL — ABNORMAL HIGH (ref 70–99)
POTASSIUM: 4.5 mmol/L (ref 3.5–5.1)
Sodium: 139 mmol/L (ref 135–145)
Total Bilirubin: 1.2 mg/dL (ref 0.3–1.2)
Total Protein: 6.7 g/dL (ref 6.5–8.1)

## 2018-06-12 LAB — CBC WITH DIFFERENTIAL/PLATELET
Abs Immature Granulocytes: 0.01 10*3/uL (ref 0.00–0.07)
BASOS PCT: 0 %
Basophils Absolute: 0 10*3/uL (ref 0.0–0.1)
Eosinophils Absolute: 0.1 10*3/uL (ref 0.0–0.5)
Eosinophils Relative: 1 %
HCT: 44.7 % (ref 39.0–52.0)
Hemoglobin: 15.7 g/dL (ref 13.0–17.0)
Immature Granulocytes: 0 %
Lymphocytes Relative: 23 %
Lymphs Abs: 1.6 10*3/uL (ref 0.7–4.0)
MCH: 33.8 pg (ref 26.0–34.0)
MCHC: 35.1 g/dL (ref 30.0–36.0)
MCV: 96.3 fL (ref 80.0–100.0)
Monocytes Absolute: 0.6 10*3/uL (ref 0.1–1.0)
Monocytes Relative: 9 %
NEUTROS ABS: 4.8 10*3/uL (ref 1.7–7.7)
Neutrophils Relative %: 67 %
Platelets: 177 10*3/uL (ref 150–400)
RBC: 4.64 MIL/uL (ref 4.22–5.81)
RDW: 13.8 % (ref 11.5–15.5)
WBC: 7.2 10*3/uL (ref 4.0–10.5)
nRBC: 0 % (ref 0.0–0.2)

## 2018-06-12 LAB — TSH: TSH: 1.118 u[IU]/mL (ref 0.320–4.118)

## 2018-06-12 MED ORDER — SODIUM CHLORIDE 0.9 % IV SOLN
Freq: Once | INTRAVENOUS | Status: AC
  Administered 2018-06-12: 11:00:00 via INTRAVENOUS
  Filled 2018-06-12: qty 250

## 2018-06-12 MED ORDER — HEPARIN SOD (PORK) LOCK FLUSH 100 UNIT/ML IV SOLN
500.0000 [IU] | Freq: Once | INTRAVENOUS | Status: AC | PRN
  Administered 2018-06-12: 500 [IU]
  Filled 2018-06-12: qty 5

## 2018-06-12 MED ORDER — SODIUM CHLORIDE 0.9 % IV SOLN
200.0000 mg | Freq: Once | INTRAVENOUS | Status: AC
  Administered 2018-06-12: 200 mg via INTRAVENOUS
  Filled 2018-06-12: qty 8

## 2018-06-12 MED ORDER — SODIUM CHLORIDE 0.9% FLUSH
10.0000 mL | INTRAVENOUS | Status: DC | PRN
  Administered 2018-06-12: 10 mL
  Filled 2018-06-12: qty 10

## 2018-06-12 NOTE — Progress Notes (Signed)
[facility] [phone]   [phone]  OFFICE PROGRESS NOTE  [name], MD [address] [place]  [address]  DIAGNOSIS: Metastatic non-small cell lung cancer initially diagnosed as stage IIIA (T2a, N2, M0) non-small cell lung cancer, poorly differentiated adenocarcinoma presented with left lower lobe lung mass in addition to mediastinal lymphadenopathy.  The patient was diagnosed with metastatic disease involving the left femur as well as left supraclavicular nodal metastases and right paratracheal lymphadenopathy in October 2018.  Biomarker Findings Microsatellite Status - MS-Stable Tumor Mutational Burden - TMB-Low (3 Muts/Mb) Genomic Findings For a complete list of the genes assayed, please refer to the Appendix. STK11 P271f*6 [id number] site 1213+0Q>MDAXX E374* MLL2 [id number] NBN K2338f NOTCH2 [id number] splice site [id number]+5M>WDisease relevant genes with no reportable alterations: EGFR, KRAS, ALK, BRAF, MET, RET, ERBB2, ROS1   PDL1 expression 5%  PRIOR THERAPY: 1) status post wedge resection of the left lower lobe lung mass as well as AP window lymph node dissection but there was residual metastatic mediastinal lymphadenopathy that could not be resected. 2) a course of concurrent chemorad[place] carbopl[place]  3) status post palliative radiotherapy to the left femur metastatic bone disease. 4)  Systemic chemotherapy with carboplatin for AUC of 5, Alimta 500 mg/M2 and Keytruda 200 mg IV every 3 weeks.  First dose March 07, 2017.  Carboplatin was discontinued during cycle #2 secondary to hypersensitivity reaction. 5) status post 2 cycles of maintenance treatment with Alimta and Ketruda (pembrolizumab).  Alimta was discontinued secondary to intolerance.  CURRENT THERAPY: Maintenance treatment with single agent Ketruda (pembrolizumab) status post 17   cycles.  INTERVAL HISTORY: Victor Huber 68o. male returns to the clinic today for follow-up visit accompanied by his son.  The patient is feeling fine today with no concerning complaints.  He denied having any chest pain, shortness of breath, cough or hemoptysis.  He has no fever or chills.  He denied having any weight loss or night sweats.  He is very active around the home these days.  He feels tired at times.  He denied having any headache or visual changes.  He is here for evaluation before starting cycle #18 of his maintenance treatment.   MEDICAL HISTORY: Past Medical History:  Diagnosis Date  . Adenocarcinoma of left lung, stage 3 ([facility]/16/2018  . Arthritis   . Atrial fibrillation ([facility]16/2018  . Atrial fibrillation ([facility] . COPD (chronic obstructive pulmonary disease) ([facility]/16/2018  . History of chemotherapy   . History of radiation therapy   . Hyperlipidemia   . Hypothyroid 08/15/2016  . Longstanding persistent atrial fibrillation 08/29/2016  . Pathologic fracture    left femur  . Pneumonitis   . S/P TURP 08/15/2016  . Wears glasses   . Wears hearing aid in both ears     ALLERGIES:  is allergic to carboplatin and flecainide.  MEDICATIONS:  Current Outpatient Medications  Medication Sig Dispense Refill  . apixaban (ELIQUIS) 5 MG TABS tablet Take 1 tablet (5 mg total) by mouth 2 (two) times daily. 180 tablet 3  . atenolol (TENORMIN) 50 MG tablet Take 1 tablet (50 mg total) by mouth daily. 90 tablet 3  . atorvastatin (LIPITOR) 40 MG tablet Take 40 mg by mouth daily.    . citalopram (CELEXA) 40 MG tablet Take 40 mg by mouth daily.    . iMarland Kitchenuprofen (ADVIL,MOTRIN) 400  MG tablet Take 400 mg by mouth every 4 (four) hours as needed for moderate pain.    Marland Kitchen levothyroxine (SYNTHROID, LEVOTHROID) 150 MCG tablet Take 150 mcg by mouth daily.    . tamsulosin (FLOMAX) 0.4 MG CAPS capsule Take 0.4 mg daily by mouth.      No current facility-administered medications for this  visit.     SURGICAL HISTORY:  Past Surgical History:  Procedure Laterality Date  . BRONCHOSCOPY  10/2014  . CARDIAC CATHETERIZATION     05/07/12  . CARDIOVERSION     x2  . COLONOSCOPY    . DG BIOPSY LUNG Left 10/2014   FNA - Adenocarcinoma   . FEMUR IM NAIL Left 02/19/2017  . FEMUR IM NAIL Left 02/19/2017   Procedure: INTRAMEDULLARY (IM) NAIL FEMORAL;  Surgeon: Marchia Bond, MD;  Location: [facility];  Service: Orthopedics;  Laterality: Left;  . IR FLUORO GUIDE PORT INSERTION RIGHT  07/03/2017  . IR US GUIDE VASC ACCESS RIGHT  07/03/2017  . LUNG CANCER SURGERY Left 12/2014   Wedge Resection   . MULTIPLE TOOTH EXTRACTIONS    . Status post TURP    . TONSILLECTOMY      REVIEW OF SYSTEMS:  A comprehensive review of systems was negative except for: Constitutional: positive for fatigue   PHYSICAL EXAMINATION: General appearance: alert, cooperative, fatigued and no distress Head: Normocephalic, without obvious abnormality, atraumatic Neck: no adenopathy, no JVD, supple, symmetrical, trachea midline and thyroid not enlarged, symmetric, no tenderness/mass/nodules Lymph nodes: Cervical, supraclavicular, and axillary nodes normal. Resp: clear to auscultation bilaterally Back: symmetric, no curvature. ROM normal. No CVA tenderness. Cardio: regular rate and rhythm, S1, S2 normal, no murmur, click, rub or gallop GI: soft, non-tender; bowel sounds normal; no masses,  no organomegaly Extremities: extremities normal, atraumatic, no cyanosis or edema  ECOG PERFORMANCE STATUS: 1 - Symptomatic but completely ambulatory  Blood pressure 121/73, pulse 91, temperature 98 F (36.7 C), temperature source Oral, resp. rate 18, height _0  (1.854 m), weight 249 lb 12.8 oz (113.3 kg), SpO2 97 %.  LABORATORY DATA: Lab Results  Component Value Date   WBC 6.3 05/22/2018   HGB 15.7 05/22/2018   HCT 44.9 05/22/2018   MCV 96.4 05/22/2018   PLT 160 05/22/2018      Chemistry      Component Value Date/Time    NA 138 05/22/2018 1000   NA 136 04/04/2017 1141   K 4.5 05/22/2018 1000   K 4.4 04/04/2017 1141   CL 105 05/22/2018 1000   CO2 23 05/22/2018 1000   CO2 24 04/04/2017 1141   BUN 18 05/22/2018 1000   BUN 21.1 04/04/2017 1141   CREATININE 1.04 05/22/2018 1000   CREATININE 1.1 04/04/2017 1141      Component Value Date/Time   CALCIUM 9.1 05/22/2018 1000   CALCIUM 9.1 04/04/2017 1141   ALKPHOS 88 05/22/2018 1000   ALKPHOS 89 04/04/2017 1141   AST 25 05/22/2018 1000   AST 17 04/04/2017 1141   ALT 24 05/22/2018 1000   ALT 18 04/04/2017 1141   BILITOT 1.0 05/22/2018 1000   BILITOT 0.67 04/04/2017 1141       RADIOGRAPHIC STUDIES: No results found.  ASSESSMENT AND PLAN: This is a 68 years old white male with metastatic non-small cell lung cancer, adenocarcinoma with no actionable mutations and PDL 1 expression of 5% that was initially diagnosed as stage IIIa non-small cell lung cancer, adenocarcinoma status post left lower lobectomy with lymph node dissection followed by a  course of concurrent chemoradiation completed in January 2016.  The patient had evidence for disease metastasis in October 2018 with metastatic disease to the left femur as well as left supraclavicular and right paratracheal lymph nodes. The patient is currently on systemic chemotherapy initially was with carboplatin, Alimta and Keytruda.  Carboplatin was discontinued secondary to hypersensitivity reaction starting from cycle #2.   He was also treated with 3 cycles of maintenance Alimta and Ketruda (pembrolizumab) but Alimta was discontinued secondary to intolerance. He is currently undergoing treatment with maintenance Keytruda as a single agent status post 17 cycles.  The patient continues to tolerate this treatment well with no concerning adverse effects. I recommended for him to proceed with cycle #18 today as scheduled. I will see him back for follow-up visit in 3 weeks for evaluation with repeat CT scan of the  chest, abdomen and pelvis for restaging of his disease. The patient was advised to call immediately if he has any concerning symptoms in the interval. The patient voices understanding of current disease status and treatment options and is in agreement with the current care plan. All questions were answered. The patient knows to call the clinic with any problems, questions or concerns. We can certainly see the patient much sooner if necessary.  Disclaimer: This note was dictated with voice recognition software. Similar sounding words can inadvertently be transcribed and may not be corrected upon review.

## 2018-06-12 NOTE — Patient Instructions (Signed)
[facility] Discharge Instructions for Patients Receiving Chemotherapy  Today you received the following chemotherapy agents: pembrolizumab Beryle Flock).  To help prevent nausea and vomiting after your treatment, we encourage you to take your nausea medication as prescribed.   If you develop nausea and vomiting that is not controlled by your nausea medication, call the clinic.   BELOW ARE SYMPTOMS THAT SHOULD BE REPORTED IMMEDIATELY:  *FEVER GREATER THAN 100.5 F  *CHILLS WITH OR WITHOUT FEVER  NAUSEA AND VOMITING THAT IS NOT CONTROLLED WITH YOUR NAUSEA MEDICATION  *UNUSUAL SHORTNESS OF BREATH  *UNUSUAL BRUISING OR BLEEDING  TENDERNESS IN MOUTH AND THROAT WITH OR WITHOUT PRESENCE OF ULCERS  *URINARY PROBLEMS  *BOWEL PROBLEMS  UNUSUAL RASH Items with * indicate a potential emergency and should be followed up as soon as possible.  Feel free to call the clinic should you have any questions or concerns. The clinic phone number is [phone].  Please show the [facility] at check-in to the Emergency Department and triage nurse.

## 2018-06-13 ENCOUNTER — Telehealth: Payer: Self-pay | Admitting: Internal Medicine

## 2018-06-13 ENCOUNTER — Encounter: Payer: Self-pay | Admitting: Oncology

## 2018-06-13 NOTE — Telephone Encounter (Signed)
Added additional appts per 3/12 los - pt to get an updated schedule next visit.

## 2018-06-19 ENCOUNTER — Encounter: Payer: Self-pay | Admitting: Oncology

## 2018-06-24 ENCOUNTER — Telehealth: Payer: Self-pay | Admitting: Medical Oncology

## 2018-06-24 NOTE — Telephone Encounter (Signed)
Pt approved for CT chest /abd and pelvis.

## 2018-07-01 ENCOUNTER — Ambulatory Visit (HOSPITAL_COMMUNITY)
Admission: RE | Admit: 2018-07-01 | Discharge: 2018-07-01 | Disposition: A | Discharge status: Home / Self Care | Payer: Medicare HMO | Source: Ambulatory Visit | Attending: Internal Medicine | Admitting: Internal Medicine

## 2018-07-01 ENCOUNTER — Encounter (HOSPITAL_COMMUNITY): Payer: Self-pay

## 2018-07-01 ENCOUNTER — Other Ambulatory Visit: Payer: Self-pay

## 2018-07-01 DIAGNOSIS — C3490 Malignant neoplasm of unspecified part of unspecified bronchus or lung: Secondary | ICD-10-CM | POA: Diagnosis present

## 2018-07-01 MED ORDER — SODIUM CHLORIDE (PF) 0.9 % IJ SOLN
INTRAMUSCULAR | Status: AC
  Filled 2018-07-01: qty 50

## 2018-07-01 MED ORDER — IOHEXOL 300 MG/ML  SOLN
100.0000 mL | Freq: Once | INTRAMUSCULAR | Status: AC | PRN
  Administered 2018-07-01: 100 mL via INTRAVENOUS

## 2018-07-03 ENCOUNTER — Encounter: Payer: Self-pay | Admitting: Internal Medicine

## 2018-07-03 ENCOUNTER — Inpatient Hospital Stay: Payer: Medicare HMO | Attending: Internal Medicine

## 2018-07-03 ENCOUNTER — Other Ambulatory Visit: Payer: Self-pay | Admitting: *Deleted

## 2018-07-03 ENCOUNTER — Other Ambulatory Visit: Payer: Self-pay

## 2018-07-03 ENCOUNTER — Inpatient Hospital Stay: Payer: Medicare HMO

## 2018-07-03 ENCOUNTER — Inpatient Hospital Stay (HOSPITAL_BASED_OUTPATIENT_CLINIC_OR_DEPARTMENT_OTHER): Payer: Medicare HMO | Admitting: Internal Medicine

## 2018-07-03 VITALS — BP 129/72 | HR 97 | Temp 97.7°F | Resp 18 | Ht 73.0 in | Wt 248.3 lb

## 2018-07-03 DIAGNOSIS — Z902 Acquired absence of lung [part of]: Secondary | ICD-10-CM

## 2018-07-03 DIAGNOSIS — C3492 Malignant neoplasm of unspecified part of left bronchus or lung: Secondary | ICD-10-CM

## 2018-07-03 DIAGNOSIS — E039 Hypothyroidism, unspecified: Secondary | ICD-10-CM | POA: Diagnosis not present

## 2018-07-03 DIAGNOSIS — Z5112 Encounter for antineoplastic immunotherapy: Secondary | ICD-10-CM | POA: Insufficient documentation

## 2018-07-03 DIAGNOSIS — C3432 Malignant neoplasm of lower lobe, left bronchus or lung: Secondary | ICD-10-CM | POA: Diagnosis not present

## 2018-07-03 DIAGNOSIS — Z79899 Other long term (current) drug therapy: Secondary | ICD-10-CM | POA: Diagnosis not present

## 2018-07-03 DIAGNOSIS — Z923 Personal history of irradiation: Secondary | ICD-10-CM | POA: Diagnosis not present

## 2018-07-03 DIAGNOSIS — C7951 Secondary malignant neoplasm of bone: Secondary | ICD-10-CM

## 2018-07-03 DIAGNOSIS — Z95828 Presence of other vascular implants and grafts: Secondary | ICD-10-CM

## 2018-07-03 DIAGNOSIS — Z9221 Personal history of antineoplastic chemotherapy: Secondary | ICD-10-CM | POA: Diagnosis not present

## 2018-07-03 LAB — CBC WITH DIFFERENTIAL (CANCER CENTER ONLY)
Abs Immature Granulocytes: 0.02 10*3/uL (ref 0.00–0.07)
Basophils Absolute: 0 10*3/uL (ref 0.0–0.1)
Basophils Relative: 0 %
Eosinophils Absolute: 0.1 10*3/uL (ref 0.0–0.5)
Eosinophils Relative: 2 %
HCT: 44.5 % (ref 39.0–52.0)
Hemoglobin: 16 g/dL (ref 13.0–17.0)
Immature Granulocytes: 0 %
Lymphocytes Relative: 20 %
Lymphs Abs: 1.2 10*3/uL (ref 0.7–4.0)
MCH: 34.6 pg — ABNORMAL HIGH (ref 26.0–34.0)
MCHC: 36 g/dL (ref 30.0–36.0)
MCV: 96.3 fL (ref 80.0–100.0)
Monocytes Absolute: 0.5 10*3/uL (ref 0.1–1.0)
Monocytes Relative: 8 %
Neutro Abs: 4.2 10*3/uL (ref 1.7–7.7)
Neutrophils Relative %: 70 %
Platelet Count: 193 10*3/uL (ref 150–400)
RBC: 4.62 MIL/uL (ref 4.22–5.81)
RDW: 13.4 % (ref 11.5–15.5)
WBC Count: 6.1 10*3/uL (ref 4.0–10.5)
nRBC: 0 % (ref 0.0–0.2)

## 2018-07-03 LAB — CMP (CANCER CENTER ONLY)
ALT: 21 U/L (ref 0–44)
AST: 23 U/L (ref 15–41)
Albumin: 4.1 g/dL (ref 3.5–5.0)
Alkaline Phosphatase: 90 U/L (ref 38–126)
Anion gap: 9 (ref 5–15)
BUN: 16 mg/dL (ref 8–23)
CO2: 25 mmol/L (ref 22–32)
Calcium: 9 mg/dL (ref 8.9–10.3)
Chloride: 108 mmol/L (ref 98–111)
Creatinine: 0.88 mg/dL (ref 0.61–1.24)
GFR, Est AFR Am: >60 mL/min (ref >60)
GFR, Estimated: >60 mL/min (ref >60)
Glucose, Bld: 112 mg/dL — ABNORMAL HIGH (ref 70–99)
Potassium: 4.4 mmol/L (ref 3.5–5.1)
Sodium: 142 mmol/L (ref 135–145)
Total Bilirubin: 1.1 mg/dL (ref 0.3–1.2)
Total Protein: 6.6 g/dL (ref 6.5–8.1)

## 2018-07-03 MED ORDER — HEPARIN SOD (PORK) LOCK FLUSH 100 UNIT/ML IV SOLN
500.0000 [IU] | Freq: Once | INTRAVENOUS | Status: AC | PRN
  Administered 2018-07-03: 12:00:00 500 [IU]
  Filled 2018-07-03: qty 5

## 2018-07-03 MED ORDER — SODIUM CHLORIDE 0.9% FLUSH
10.0000 mL | INTRAVENOUS | Status: DC | PRN
  Administered 2018-07-03: 12:00:00 10 mL
  Filled 2018-07-03: qty 10

## 2018-07-03 MED ORDER — SODIUM CHLORIDE 0.9 % IV SOLN
200.0000 mg | Freq: Once | INTRAVENOUS | Status: AC
  Administered 2018-07-03: 200 mg via INTRAVENOUS
  Filled 2018-07-03: qty 8

## 2018-07-03 MED ORDER — SODIUM CHLORIDE 0.9 % IV SOLN
Freq: Once | INTRAVENOUS | Status: AC
  Administered 2018-07-03: 11:00:00 via INTRAVENOUS
  Filled 2018-07-03: qty 250

## 2018-07-03 MED ORDER — SODIUM CHLORIDE 0.9% FLUSH
10.0000 mL | INTRAVENOUS | Status: DC | PRN
  Administered 2018-07-03: 10 mL
  Filled 2018-07-03: qty 10

## 2018-07-03 NOTE — Progress Notes (Signed)
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  Gave my card for any additional financial questions or concerns.

## 2018-07-03 NOTE — Patient Instructions (Signed)
[facility] Discharge Instructions for Patients Receiving Chemotherapy  Today you received the following chemotherapy agents:  Keytruda.  To help prevent nausea and vomiting after your treatment, we encourage you to take your nausea medication as directed.   If you develop nausea and vomiting that is not controlled by your nausea medication, call the clinic.   BELOW ARE SYMPTOMS THAT SHOULD BE REPORTED IMMEDIATELY:  *FEVER GREATER THAN 100.5 F  *CHILLS WITH OR WITHOUT FEVER  NAUSEA AND VOMITING THAT IS NOT CONTROLLED WITH YOUR NAUSEA MEDICATION  *UNUSUAL SHORTNESS OF BREATH  *UNUSUAL BRUISING OR BLEEDING  TENDERNESS IN MOUTH AND THROAT WITH OR WITHOUT PRESENCE OF ULCERS  *URINARY PROBLEMS  *BOWEL PROBLEMS  UNUSUAL RASH Items with * indicate a potential emergency and should be followed up as soon as possible.  Feel free to call the clinic should you have any questions or concerns. The clinic phone number is [phone].  Please show the CHEMO ALERT CARD at check-in to the Emergency Department and triage nurse.    

## 2018-07-03 NOTE — Progress Notes (Signed)
[place] [phone]   [phone]  OFFICE PROGRESS NOTE  [name], MD [address] [place] [address]  DIAGNOSIS: Metastatic non-small cell lung cancer initially diagnosed as stage IIIA (T2a, N2, M0) non-small cell lung cancer, poorly differentiated adenocarcinoma presented with left lower lobe lung mass in addition to mediastinal lymphadenopathy.  The patient was diagnosed with metastatic disease involving the left femur as well as left supraclavicular nodal metastases and right paratracheal lymphadenopathy in October 2018.  Biomarker Findings Microsatellite Status - MS-Stable Tumor Mutational Burden - TMB-Low (3 Muts/Mb) Genomic Findings For a complete list of the genes assayed, please refer to the Appendix. STK11 P257f*6 [id number] site 1657+8I>ODAXX E374* MLL2 [id number] NBN K23375f NOTCH2 [id number] splice site [id number]+[id number]>GDisease relevant genes with no reportable alterations: EGFR, KRAS, ALK, BRAF, MET, RET, ERBB2, ROS1   PDL1 expression 5%  PRIOR THERAPY: 1) status post wedge resection of the left lower lobe lung mass as well as AP window lymph node dissection but there was residual metastatic mediastinal lymphadenopathy that could not be resected. 2) a course of [place] chem[place]  3) status post palliative radiotherapy to the left femur metastatic bone disease. 4)  Systemic chemotherapy with carboplatin for AUC of 5, Alimta 500 mg/M2 and Keytruda 200 mg IV every 3 weeks.  First dose March 07, 2017.  Carboplatin was discontinued during cycle #2 secondary to hypersensitivity reaction. 5) status post 2 cycles of maintenance treatment with Alimta and Ketruda (pembrolizumab).  Alimta was discontinued secondary to intolerance.  CURRENT THERAPY: Maintenance treatment with single agent Ketruda (pembrolizumab) status post 18   cycles.  INTERVAL HISTORY: Victor Huber 51o. male returns to the clinic today for follow-up visit.  The patient is feeling fine today with no concerning complaints.  He continues to tolerate his treatment with Keytruda fairly well.  He denied having any chest pain, shortness of breath, cough or hemoptysis.  He denied having any fever or chills.  He has no nausea, vomiting, diarrhea or constipation.  He denied having any significant weight loss.  He has no headache or visual changes.  He had repeat CT scan of the chest, abdomen and pelvis performed recently.  He is here today for evaluation and discussion of his scan results.  MEDICAL HISTORY: Past Medical History:  Diagnosis Date   [place]den[place] of left lung, stage 3 [place]   Arthritis    Atrial fibrillation ([facility]/[place]   Atrial fibrillation ([facility])    COPD (chronic obstructive pulmonary disease) ([facility]16/2018   History of chemotherapy    History of radiation therapy    Hyperlipidemia    Hypothyroid 08/15/2016   Longstanding persistent atrial fibrillation 08/29/2016   Pathologic fracture    left femur   Pneumonitis    S/P TURP 08/15/2016   Wears glasses    Wears hearing aid in both ears     ALLERGIES:  is allergic to carboplatin and flecainide.  MEDICATIONS:  Current Outpatient Medications  Medication Sig Dispense Refill   apixaban (ELIQUIS) 5 MG TABS tablet Take 1 tablet (5 mg total) by mouth 2 (two) times daily. 180 tablet 3   atenolol (TENORMIN) 50 MG tablet Take 1 tablet (50 mg total) by mouth daily. 90 tablet 3   atorvastatin (LIPITOR) 40 MG tablet Take 40 mg by mouth daily.     citalopram (CELEXA) 40 MG tablet Take 40 mg  by mouth daily.     ibuprofen (ADVIL,MOTRIN) 400 MG tablet Take 400 mg by mouth every 4 (four) hours as needed for moderate pain.     levothyroxine (SYNTHROID, LEVOTHROID) 150 MCG tablet Take 150 mcg by mouth daily.     tamsulosin (FLOMAX) 0.4 MG CAPS capsule Take  0.4 mg daily by mouth.      No current facility-administered medications for this visit.     SURGICAL HISTORY:  Past Surgical History:  Procedure Laterality Date   BRONCHOSCOPY  10/2014   CARDIAC CATHETERIZATION     05/07/12   CARDIOVERSION     x2   COLONOSCOPY     DG BIOPSY LUNG Left 10/2014   FNA - Adenocarcinoma    FEMUR IM NAIL Left 02/19/2017   FEMUR IM NAIL Left 02/19/2017   Procedure: INTRAMEDULLARY (IM) NAIL FEMORAL;  Surgeon: Marchia Bond, MD;  Location: [facility];  Service: Orthopedics;  Laterality: Left;   IR FLUORO GUIDE PORT INSERTION RIGHT  07/03/2017   IR US GUIDE VASC ACCESS RIGHT  07/03/2017   LUNG CANCER SURGERY Left 12/2014   Wedge Resection    MULTIPLE TOOTH EXTRACTIONS     Status post TURP     TONSILLECTOMY      REVIEW OF SYSTEMS:  Constitutional: positive for fatigue Eyes: negative Ears, nose, mouth, throat, and face: negative Respiratory: negative Cardiovascular: negative Gastrointestinal: negative Genitourinary:negative Integument/breast: negative Hematologic/lymphatic: negative Musculoskeletal:negative Neurological: negative Behavioral/Psych: negative Endocrine: negative Allergic/Immunologic: negative   PHYSICAL EXAMINATION: General appearance: alert, cooperative, fatigued and no distress Head: Normocephalic, without obvious abnormality, atraumatic Neck: no adenopathy, no JVD, supple, symmetrical, trachea midline and thyroid not enlarged, symmetric, no tenderness/mass/nodules Lymph nodes: Cervical, supraclavicular, and axillary nodes normal. Resp: clear to auscultation bilaterally Back: symmetric, no curvature. ROM normal. No CVA tenderness. Cardio: regular rate and rhythm, S1, S2 normal, no murmur, click, rub or gallop GI: soft, non-tender; bowel sounds normal; no masses,  no organomegaly Extremities: extremities normal, atraumatic, no cyanosis or edema Neurologic: Alert and oriented X 3, normal strength and tone. Normal symmetric  reflexes. Normal coordination and gait  ECOG PERFORMANCE STATUS: 1 - Symptomatic but completely ambulatory  Blood pressure 129/72, pulse 97, temperature 97.7 F (36.5 C), temperature source Oral, resp. rate 18, height _0  (1.854 m), weight 248 lb 4.8 oz (112.6 kg), SpO2 96 %.  LABORATORY DATA: Lab Results  Component Value Date   WBC 6.1 07/03/2018   HGB 16.0 07/03/2018   HCT 44.5 07/03/2018   MCV 96.3 07/03/2018   PLT 193 07/03/2018      Chemistry      Component Value Date/Time   NA 139 06/12/2018 0956   NA 136 04/04/2017 1141   K 4.5 06/12/2018 0956   K 4.4 04/04/2017 1141   CL 106 06/12/2018 0956   CO2 23 06/12/2018 0956   CO2 24 04/04/2017 1141   BUN 17 06/12/2018 0956   BUN 21.1 04/04/2017 1141   CREATININE 0.91 06/12/2018 0956   CREATININE 1.1 04/04/2017 1141      Component Value Date/Time   CALCIUM 9.1 06/12/2018 0956   CALCIUM 9.1 04/04/2017 1141   ALKPHOS 93 06/12/2018 0956   ALKPHOS 89 04/04/2017 1141   AST 24 06/12/2018 0956   AST 17 04/04/2017 1141   ALT 21 06/12/2018 0956   ALT 18 04/04/2017 1141   BILITOT 1.2 06/12/2018 0956   BILITOT 0.67 04/04/2017 1141       RADIOGRAPHIC STUDIES: Ct Chest W Contrast  Result Date: 07/01/2018 CLINICAL  DATA:  History of left lower lobe non-small cell lung cancer status post wedge resection and concurrent chemoradiation therapy in 2016. Recurrent metastatic disease to the mediastinal and supraclavicular lymph nodes and left femur in 2018. Ongoing immunotherapy. Restaging. EXAM: CT CHEST, ABDOMEN, AND PELVIS WITH CONTRAST TECHNIQUE: Multidetector CT imaging of the chest, abdomen and pelvis was performed following the standard protocol during bolus administration of intravenous contrast. CONTRAST:  147m OMNIPAQUE IOHEXOL 300 MG/ML  SOLN COMPARISON:  04/29/2018 CT chest, abdomen and pelvis. FINDINGS: CT CHEST FINDINGS Cardiovascular: Top-normal heart size. Stable small pericardial effusion/thickening. Three-vessel  coronary atherosclerosis. Right internal jugular Port-A-Cath terminates at the cavoatrial junction. Atherosclerotic nonaneurysmal thoracic aorta. Stable dilated main pulmonary artery (3.6 cm diameter). No central pulmonary emboli. Mediastinum/Nodes: Stable coarse subcentimeter posterior right thyroid lobe calcification. Unremarkable esophagus. No axillary adenopathy. Right paratracheal and subcarinal lymphadenopathy is not appreciably changed. Representative 1.9 cm right paratracheal node (series 2/image 27), previously 1.9 cm using similar measurement technique, stable. Enlarged node (series 2/1.5 cm subcarinal image 37), previously 1.6 cm, not appreciably changed. No new pathologically enlarged mediastinal nodes. No pathologically enlarged hilar nodes. Lungs/Pleura: No pneumothorax. Small dependent left pleural effusion. No right pleural effusion. Severe centrilobular and paraseptal emphysema with diffuse bronchial wall thickening. Sharply marginated parahilar consolidation in the left mid lung with associated volume loss, bronchiectasis and distortion, compatible with radiation fibrosis, stable. No acute consolidative airspace disease or lung masses. There is patchy peribronchovascular interstitial thickening and ground-glass opacity throughout both lungs involving all lung lobes, stable to the slightly improved in the interval. No new significant pulmonary nodules. Musculoskeletal: No aggressive appearing focal osseous lesions. Stable post thoracotomy change in the left mid ribs. Moderate thoracic spondylosis. CT ABDOMEN PELVIS FINDINGS Hepatobiliary: Normal liver size. Subcentimeter hypodense lateral segment left liver lesion, too small characterize, stable since at least 2018, considered benign. No new liver lesions. Cholelithiasis. No biliary ductal dilatation. Pancreas: Normal, with no mass or duct dilation. Spleen: Normal size. No mass. Adrenals/Urinary Tract: Stable 1.2 cm right adrenal nodule since  01/12/2017 PET-CT, most compatible with a benign nodule. No new adrenal nodules. No hydronephrosis. Simple left renal cysts, largest 2.2 cm in the lower left kidney. Normal bladder. Stomach/Bowel: Normal non-distended stomach. Normal caliber small bowel with no small bowel wall thickening. Normal appendix. Mild left colonic diverticulosis, with no large bowel wall thickening or significant pericolonic fat stranding. Moderate colorectal stool volume. Vascular/Lymphatic: Atherosclerotic nonaneurysmal abdominal aorta. Patent portal, splenic, hepatic and renal veins. No pathologically enlarged lymph nodes in the abdomen or pelvis. Reproductive: Mildly enlarged prostate. Post TURP changes in the prostate. Other: No pneumoperitoneum, ascites or focal fluid collection. Small fat containing midline supraumbilical ventral abdominal hernia, stable. Musculoskeletal: No aggressive appearing focal osseous lesions. Partially visualized fixation hardware in the proximal left femur. Moderate lumbar spondylosis. IMPRESSION: 1. Stable left perihilar radiation fibrosis without evidence of local tumor recurrence. 2. Patchy peribronchovascular ground-glass opacity and interstitial thickening throughout both lungs is stable to slightly improved since 04/29/2018 and clearly improved since 02/11/2018 CT. These findings are nonspecific with differential including lymphangitic tumor or drug toxicity. 3. Stable mediastinal lymphadenopathy. 4. No new or progressive metastatic disease. 5. Small dependent left pleural effusion. 6. Aortic Atherosclerosis (ICD10-I70.0) and Emphysema (ICD10-J43.9). Electronically Signed   By: JIlona SorrelM.D.   On: 07/01/2018 11:01   Ct Abdomen Pelvis W Contrast  Result Date: 07/01/2018 CLINICAL DATA:  History of left lower lobe non-small cell lung cancer status post wedge resection and concurrent chemoradiation therapy in 2016. Recurrent  metastatic disease to the mediastinal and supraclavicular lymph nodes  and left femur in 2018. Ongoing immunotherapy. Restaging. EXAM: CT CHEST, ABDOMEN, AND PELVIS WITH CONTRAST TECHNIQUE: Multidetector CT imaging of the chest, abdomen and pelvis was performed following the standard protocol during bolus administration of intravenous contrast. CONTRAST:  173m OMNIPAQUE IOHEXOL 300 MG/ML  SOLN COMPARISON:  04/29/2018 CT chest, abdomen and pelvis. FINDINGS: CT CHEST FINDINGS Cardiovascular: Top-normal heart size. Stable small pericardial effusion/thickening. Three-vessel coronary atherosclerosis. Right internal jugular Port-A-Cath terminates at the cavoatrial junction. Atherosclerotic nonaneurysmal thoracic aorta. Stable dilated main pulmonary artery (3.6 cm diameter). No central pulmonary emboli. Mediastinum/Nodes: Stable coarse subcentimeter posterior right thyroid lobe calcification. Unremarkable esophagus. No axillary adenopathy. Right paratracheal and subcarinal lymphadenopathy is not appreciably changed. Representative 1.9 cm right paratracheal node (series 2/image 27), previously 1.9 cm using similar measurement technique, stable. Enlarged node (series 2/1.5 cm subcarinal image 37), previously 1.6 cm, not appreciably changed. No new pathologically enlarged mediastinal nodes. No pathologically enlarged hilar nodes. Lungs/Pleura: No pneumothorax. Small dependent left pleural effusion. No right pleural effusion. Severe centrilobular and paraseptal emphysema with diffuse bronchial wall thickening. Sharply marginated parahilar consolidation in the left mid lung with associated volume loss, bronchiectasis and distortion, compatible with radiation fibrosis, stable. No acute consolidative airspace disease or lung masses. There is patchy peribronchovascular interstitial thickening and ground-glass opacity throughout both lungs involving all lung lobes, stable to the slightly improved in the interval. No new significant pulmonary nodules. Musculoskeletal: No aggressive appearing focal  osseous lesions. Stable post thoracotomy change in the left mid ribs. Moderate thoracic spondylosis. CT ABDOMEN PELVIS FINDINGS Hepatobiliary: Normal liver size. Subcentimeter hypodense lateral segment left liver lesion, too small characterize, stable since at least 2018, considered benign. No new liver lesions. Cholelithiasis. No biliary ductal dilatation. Pancreas: Normal, with no mass or duct dilation. Spleen: Normal size. No mass. Adrenals/Urinary Tract: Stable 1.2 cm right adrenal nodule since 01/12/2017 PET-CT, most compatible with a benign nodule. No new adrenal nodules. No hydronephrosis. Simple left renal cysts, largest 2.2 cm in the lower left kidney. Normal bladder. Stomach/Bowel: Normal non-distended stomach. Normal caliber small bowel with no small bowel wall thickening. Normal appendix. Mild left colonic diverticulosis, with no large bowel wall thickening or significant pericolonic fat stranding. Moderate colorectal stool volume. Vascular/Lymphatic: Atherosclerotic nonaneurysmal abdominal aorta. Patent portal, splenic, hepatic and renal veins. No pathologically enlarged lymph nodes in the abdomen or pelvis. Reproductive: Mildly enlarged prostate. Post TURP changes in the prostate. Other: No pneumoperitoneum, ascites or focal fluid collection. Small fat containing midline supraumbilical ventral abdominal hernia, stable. Musculoskeletal: No aggressive appearing focal osseous lesions. Partially visualized fixation hardware in the proximal left femur. Moderate lumbar spondylosis. IMPRESSION: 1. Stable left perihilar radiation fibrosis without evidence of local tumor recurrence. 2. Patchy peribronchovascular ground-glass opacity and interstitial thickening throughout both lungs is stable to slightly improved since 04/29/2018 and clearly improved since 02/11/2018 CT. These findings are nonspecific with differential including lymphangitic tumor or drug toxicity. 3. Stable mediastinal lymphadenopathy. 4. No  new or progressive metastatic disease. 5. Small dependent left pleural effusion. 6. Aortic Atherosclerosis (ICD10-I70.0) and Emphysema (ICD10-J43.9). Electronically Signed   By: JIlona SorrelM.D.   On: 07/01/2018 11:01    ASSESSMENT AND PLAN: This is a 68years old white male with metastatic non-small cell lung cancer, adenocarcinoma with no actionable mutations and PDL 1 expression of 5% that was initially diagnosed as stage IIIa non-small cell lung cancer, adenocarcinoma status post left lower lobectomy with lymph node dissection followed by a  course of concurrent chemoradiation completed in January 2016.  The patient had evidence for disease metastasis in October 2018 with metastatic disease to the left femur as well as left supraclavicular and right paratracheal lymph nodes. The patient is currently on systemic chemotherapy initially was with carboplatin, Alimta and Keytruda.  Carboplatin was discontinued secondary to hypersensitivity reaction starting from cycle #2.   He was also treated with 3 cycles of maintenance Alimta and Ketruda (pembrolizumab) but Alimta was discontinued secondary to intolerance. He is currently undergoing treatment with maintenance Keytruda as a single agent status post 18 cycles.  The patient has been tolerating this treatment well with no concerning adverse effects. He had repeat CT scan of the chest, abdomen and pelvis performed recently.  I personally and independently reviewed the scans and discussed the results with the patient today.  His scan showed no concerning findings for disease progression. I recommended for the patient to continue his current treatment with [facility] and he will proceed with cycle #19 of his maintenance treatment today. For the hypothyroidism, he will continue his current treatment with levothyroxine and will continue to monitor his TSH closely. The patient was advised to call immediately if he has any concerning symptoms in the interval. The  patient voices understanding of current disease status and treatment options and is in agreement with the current care plan. All questions were answered. The patient knows to call the clinic with any problems, questions or concerns. We can certainly see the patient much sooner if necessary.  Disclaimer: This note was dictated with voice recognition software. Similar sounding words can inadvertently be transcribed and may not be corrected upon review.

## 2018-07-22 ENCOUNTER — Other Ambulatory Visit: Payer: Self-pay | Admitting: Medical Oncology

## 2018-07-22 DIAGNOSIS — C3492 Malignant neoplasm of unspecified part of left bronchus or lung: Secondary | ICD-10-CM

## 2018-07-24 ENCOUNTER — Inpatient Hospital Stay: Payer: Medicare HMO

## 2018-07-24 ENCOUNTER — Encounter: Payer: Self-pay | Admitting: Internal Medicine

## 2018-07-24 ENCOUNTER — Inpatient Hospital Stay (HOSPITAL_BASED_OUTPATIENT_CLINIC_OR_DEPARTMENT_OTHER): Payer: Medicare HMO | Admitting: Internal Medicine

## 2018-07-24 ENCOUNTER — Other Ambulatory Visit: Payer: Self-pay

## 2018-07-24 VITALS — BP 116/69 | HR 60 | Temp 98.0°F | Resp 18 | Ht 73.0 in | Wt 245.9 lb

## 2018-07-24 DIAGNOSIS — Z9221 Personal history of antineoplastic chemotherapy: Secondary | ICD-10-CM

## 2018-07-24 DIAGNOSIS — E039 Hypothyroidism, unspecified: Secondary | ICD-10-CM | POA: Diagnosis not present

## 2018-07-24 DIAGNOSIS — C7951 Secondary malignant neoplasm of bone: Secondary | ICD-10-CM

## 2018-07-24 DIAGNOSIS — Z79899 Other long term (current) drug therapy: Secondary | ICD-10-CM

## 2018-07-24 DIAGNOSIS — C3492 Malignant neoplasm of unspecified part of left bronchus or lung: Secondary | ICD-10-CM

## 2018-07-24 DIAGNOSIS — C3432 Malignant neoplasm of lower lobe, left bronchus or lung: Secondary | ICD-10-CM | POA: Diagnosis not present

## 2018-07-24 DIAGNOSIS — Z902 Acquired absence of lung [part of]: Secondary | ICD-10-CM | POA: Diagnosis not present

## 2018-07-24 DIAGNOSIS — Z5112 Encounter for antineoplastic immunotherapy: Secondary | ICD-10-CM | POA: Diagnosis not present

## 2018-07-24 DIAGNOSIS — Z95828 Presence of other vascular implants and grafts: Secondary | ICD-10-CM

## 2018-07-24 DIAGNOSIS — Z923 Personal history of irradiation: Secondary | ICD-10-CM

## 2018-07-24 LAB — CBC WITH DIFFERENTIAL (CANCER CENTER ONLY)
Abs Immature Granulocytes: 0.03 10*3/uL (ref 0.00–0.07)
Basophils Absolute: 0 10*3/uL (ref 0.0–0.1)
Basophils Relative: 0 %
Eosinophils Absolute: 0.1 10*3/uL (ref 0.0–0.5)
Eosinophils Relative: 1 %
HCT: 46.4 % (ref 39.0–52.0)
Hemoglobin: 16.3 g/dL (ref 13.0–17.0)
Immature Granulocytes: 0 %
Lymphocytes Relative: 17 %
Lymphs Abs: 1.3 10*3/uL (ref 0.7–4.0)
MCH: 33.6 pg (ref 26.0–34.0)
MCHC: 35.1 g/dL (ref 30.0–36.0)
MCV: 95.7 fL (ref 80.0–100.0)
Monocytes Absolute: 0.7 10*3/uL (ref 0.1–1.0)
Monocytes Relative: 8 %
Neutro Abs: 5.6 10*3/uL (ref 1.7–7.7)
Neutrophils Relative %: 74 %
Platelet Count: 163 10*3/uL (ref 150–400)
RBC: 4.85 MIL/uL (ref 4.22–5.81)
RDW: 13.2 % (ref 11.5–15.5)
WBC Count: 7.7 10*3/uL (ref 4.0–10.5)
nRBC: 0 % (ref 0.0–0.2)

## 2018-07-24 LAB — CMP (CANCER CENTER ONLY)
ALT: 20 U/L (ref 0–44)
AST: 18 U/L (ref 15–41)
Albumin: 4 g/dL (ref 3.5–5.0)
Alkaline Phosphatase: 93 U/L (ref 38–126)
Anion gap: 10 (ref 5–15)
BUN: 15 mg/dL (ref 8–23)
CO2: 24 mmol/L (ref 22–32)
Calcium: 9.2 mg/dL (ref 8.9–10.3)
Chloride: 106 mmol/L (ref 98–111)
Creatinine: 0.92 mg/dL (ref 0.61–1.24)
GFR, Est AFR Am: >60 mL/min (ref >60)
GFR, Estimated: >60 mL/min (ref >60)
Glucose, Bld: 116 mg/dL — ABNORMAL HIGH (ref 70–99)
Potassium: 4.5 mmol/L (ref 3.5–5.1)
Sodium: 140 mmol/L (ref 135–145)
Total Bilirubin: 1 mg/dL (ref 0.3–1.2)
Total Protein: 6.6 g/dL (ref 6.5–8.1)

## 2018-07-24 LAB — TSH: TSH: <0.08 u[IU]/mL — ABNORMAL LOW (ref 0.320–4.118)

## 2018-07-24 MED ORDER — SODIUM CHLORIDE 0.9% FLUSH
10.0000 mL | INTRAVENOUS | Status: DC | PRN
  Administered 2018-07-24: 10 mL
  Filled 2018-07-24: qty 10

## 2018-07-24 MED ORDER — HEPARIN SOD (PORK) LOCK FLUSH 100 UNIT/ML IV SOLN
500.0000 [IU] | Freq: Once | INTRAVENOUS | Status: AC | PRN
  Administered 2018-07-24: 500 [IU]
  Filled 2018-07-24: qty 5

## 2018-07-24 MED ORDER — SODIUM CHLORIDE 0.9 % IV SOLN
200.0000 mg | Freq: Once | INTRAVENOUS | Status: AC
  Administered 2018-07-24: 12:00:00 200 mg via INTRAVENOUS
  Filled 2018-07-24: qty 8

## 2018-07-24 MED ORDER — SODIUM CHLORIDE 0.9 % IV SOLN
Freq: Once | INTRAVENOUS | Status: AC
  Administered 2018-07-24: 11:00:00 via INTRAVENOUS
  Filled 2018-07-24: qty 250

## 2018-07-24 NOTE — Progress Notes (Signed)
[place] [phone]   [phone]  OFFICE PROGRESS NOTE  Victor Dy, MD [address] [place]  [address]  DIAGNOSIS: Metastatic non-small cell lung cancer initially diagnosed as stage IIIA (T2a, N2, M0) non-small cell lung cancer, poorly differentiated adenocarcinoma presented with left lower lobe lung mass in addition to mediastinal lymphadenopathy.  The patient was diagnosed with metastatic disease involving the left femur as well as left supraclavicular nodal metastases and right paratracheal lymphadenopathy in October 2018.  Biomarker Findings Microsatellite Status - MS-Stable Tumor Mutational Burden - TMB-Low (3 Muts/Mb) Genomic Findings For a complete list of the genes assayed, please refer to the Appendix. STK11 P297f*6 CPRXY5OpP92TWKsplice site 1462+8M>NDAXX E374* MLL2 [id number] NBN [id number] NOTCH2 [id number] splice site [id number]>XDisease relevant genes with no reportable alterations: EGFR, KRAS, ALK, BRAF, MET, RET, ERBB2, ROS1   PDL1 expression 5%  PRIOR THERAPY: 1) status post wedge resection of the left lower lobe lung mass as well as AP window lymph node dissection but there was residual metastatic mediastinal lymphadenopathy that could not be resected. 2) a course of [place] chemorad[place]  3) status post palliative radiotherapy to the left femur metastatic bone disease. 4)  Systemic chemotherapy with carboplatin for AUC of 5, Alimta 500 mg/M2 and Keytruda 200 mg IV every 3 weeks.  First dose March 07, 2017.  Carboplatin was discontinued during cycle #2 secondary to hypersensitivity reaction. 5) status post 2 cycles of maintenance treatment with Alimta and Ketruda (pembrolizumab).  Alimta was discontinued secondary to intolerance.  CURRENT THERAPY: Maintenance treatment with single agent Ketruda (pembrolizumab) status post 19   cycles.  INTERVAL HISTORY: Victor Huber 68o. male returns to the clinic today for follow-up visit.  The patient is feeling fine today with no concerning complaints except for mild fatigue and shortness of breath with exertion recently.  He denied having any chest pain, cough or hemoptysis.  He denied having any fever or chills.  He has no nausea, vomiting, diarrhea or constipation.  He has no headache or visual changes.  He is here today for evaluation before starting cycle #20 of his maintenance treatment.  MEDICAL HISTORY: Past Medical History:  Diagnosis Date   Adenocarcinoma of left lung, stage 3 ([place]/16/[place]   Arthritis    Atrial fibrillation ([place]16/[place]   Atrial fibrillation (HCC)    COPD (chronic obstructive pulmonary disease) ([place] [place]/16/[place]   History of chemotherapy    History of radiation therapy    Hyperlipidemia    Hypothyroid 08/15/2016   Longstanding persistent atrial fibrillation 08/29/2016   Pathologic fracture    left femur   Pneumonitis    S/P TURP 08/15/2016   Wears glasses    Wears hearing aid in both ears     ALLERGIES:  is allergic to carboplatin and flecainide.  MEDICATIONS:  Current Outpatient Medications  Medication Sig Dispense Refill   apixaban (ELIQUIS) 5 MG TABS tablet Take 1 tablet (5 mg total) by mouth 2 (two) times daily. 180 tablet 3   atenolol (TENORMIN) 50 MG tablet Take 1 tablet (50 mg total) by mouth daily. 90 tablet 3   atorvastatin (LIPITOR) 40 MG tablet Take 40 mg by mouth daily.     citalopram (CELEXA) 40 MG tablet Take 40 mg by mouth daily.     ibuprofen (ADVIL,MOTRIN) 400 MG tablet Take 400 mg by mouth every 4 (four) hours as needed  for moderate pain.     levothyroxine (SYNTHROID, LEVOTHROID) 150 MCG tablet Take 150 mcg by mouth daily.     tamsulosin (FLOMAX) 0.4 MG CAPS capsule Take 0.4 mg daily by mouth.      No current facility-administered medications for this visit.     SURGICAL HISTORY:  Past  Surgical History:  Procedure Laterality Date   BRONCHOSCOPY  10/2014   CARDIAC CATHETERIZATION     05/07/12   CARDIOVERSION     x2   COLONOSCOPY     DG BIOPSY LUNG Left 10/2014   FNA - Adenocarcinoma    FEMUR IM NAIL Left 02/19/2017   FEMUR IM NAIL Left 02/19/2017   Procedure: INTRAMEDULLARY (IM) NAIL FEMORAL;  Surgeon: Marchia Bond, MD;  Location: [facility];  Service: Orthopedics;  Laterality: Left;   IR FLUORO GUIDE PORT INSERTION RIGHT  07/03/2017   IR US GUIDE VASC ACCESS RIGHT  07/03/2017   LUNG CANCER SURGERY Left 12/2014   Wedge Resection    MULTIPLE TOOTH EXTRACTIONS     Status post TURP     TONSILLECTOMY      REVIEW OF SYSTEMS:  A comprehensive review of systems was negative except for: Constitutional: positive for fatigue Respiratory: positive for dyspnea on exertion   PHYSICAL EXAMINATION: General appearance: alert, cooperative, fatigued and no distress Head: Normocephalic, without obvious abnormality, atraumatic Neck: no adenopathy, no JVD, supple, symmetrical, trachea midline and thyroid not enlarged, symmetric, no tenderness/mass/nodules Lymph nodes: Cervical, supraclavicular, and axillary nodes normal. Resp: clear to auscultation bilaterally Back: symmetric, no curvature. ROM normal. No CVA tenderness. Cardio: regular rate and rhythm, S1, S2 normal, no murmur, click, rub or gallop GI: soft, non-tender; bowel sounds normal; no masses,  no organomegaly Extremities: extremities normal, atraumatic, no cyanosis or edema  ECOG PERFORMANCE STATUS: 1 - Symptomatic but completely ambulatory  Blood pressure 116/69, pulse 60, temperature 98 F (36.7 C), temperature source Oral, resp. rate 18, height 6' 1"  (1.854 m), weight 245 lb 14.4 oz (111.5 kg).  LABORATORY DATA: Lab Results  Component Value Date   WBC 7.7 07/24/2018   HGB 16.3 07/24/2018   HCT 46.4 07/24/2018   MCV 95.7 07/24/2018   PLT 163 07/24/2018      Chemistry      Component Value Date/Time    NA 142 07/03/2018 0924   NA 136 04/04/2017 1141   K 4.4 07/03/2018 0924   K 4.4 04/04/2017 1141   CL 108 07/03/2018 0924   CO2 25 07/03/2018 0924   CO2 24 04/04/2017 1141   BUN 16 07/03/2018 0924   BUN 21.1 04/04/2017 1141   CREATININE 0.88 07/03/2018 0924   CREATININE 1.1 04/04/2017 1141      Component Value Date/Time   CALCIUM 9.0 07/03/2018 0924   CALCIUM 9.1 04/04/2017 1141   ALKPHOS 90 07/03/2018 0924   ALKPHOS 89 04/04/2017 1141   AST 23 07/03/2018 0924   AST 17 04/04/2017 1141   ALT 21 07/03/2018 0924   ALT 18 04/04/2017 1141   BILITOT 1.1 07/03/2018 0924   BILITOT 0.67 04/04/2017 1141       RADIOGRAPHIC STUDIES: Ct Chest W Contrast  Result Date: 07/01/2018 CLINICAL DATA:  History of left lower lobe non-small cell lung cancer status post wedge resection and concurrent chemoradiation therapy in 2016. Recurrent metastatic disease to the mediastinal and supraclavicular lymph nodes and left femur in 2018. Ongoing immunotherapy. Restaging. EXAM: CT CHEST, ABDOMEN, AND PELVIS WITH CONTRAST TECHNIQUE: Multidetector CT imaging of the chest, abdomen and pelvis  was performed following the standard protocol during bolus administration of intravenous contrast. CONTRAST:  135m OMNIPAQUE IOHEXOL 300 MG/ML  SOLN COMPARISON:  04/29/2018 CT chest, abdomen and pelvis. FINDINGS: CT CHEST FINDINGS Cardiovascular: Top-normal heart size. Stable small pericardial effusion/thickening. Three-vessel coronary atherosclerosis. Right internal jugular Port-A-Cath terminates at the cavoatrial junction. Atherosclerotic nonaneurysmal thoracic aorta. Stable dilated main pulmonary artery (3.6 cm diameter). No central pulmonary emboli. Mediastinum/Nodes: Stable coarse subcentimeter posterior right thyroid lobe calcification. Unremarkable esophagus. No axillary adenopathy. Right paratracheal and subcarinal lymphadenopathy is not appreciably changed. Representative 1.9 cm right paratracheal node (series 2/image 27),  previously 1.9 cm using similar measurement technique, stable. Enlarged node (series 2/1.5 cm subcarinal image 37), previously 1.6 cm, not appreciably changed. No new pathologically enlarged mediastinal nodes. No pathologically enlarged hilar nodes. Lungs/Pleura: No pneumothorax. Small dependent left pleural effusion. No right pleural effusion. Severe centrilobular and paraseptal emphysema with diffuse bronchial wall thickening. Sharply marginated parahilar consolidation in the left mid lung with associated volume loss, bronchiectasis and distortion, compatible with radiation fibrosis, stable. No acute consolidative airspace disease or lung masses. There is patchy peribronchovascular interstitial thickening and ground-glass opacity throughout both lungs involving all lung lobes, stable to the slightly improved in the interval. No new significant pulmonary nodules. Musculoskeletal: No aggressive appearing focal osseous lesions. Stable post thoracotomy change in the left mid ribs. Moderate thoracic spondylosis. CT ABDOMEN PELVIS FINDINGS Hepatobiliary: Normal liver size. Subcentimeter hypodense lateral segment left liver lesion, too small characterize, stable since at least 2018, considered benign. No new liver lesions. Cholelithiasis. No biliary ductal dilatation. Pancreas: Normal, with no mass or duct dilation. Spleen: Normal size. No mass. Adrenals/Urinary Tract: Stable 1.2 cm right adrenal nodule since 01/12/2017 PET-CT, most compatible with a benign nodule. No new adrenal nodules. No hydronephrosis. Simple left renal cysts, largest 2.2 cm in the lower left kidney. Normal bladder. Stomach/Bowel: Normal non-distended stomach. Normal caliber small bowel with no small bowel wall thickening. Normal appendix. Mild left colonic diverticulosis, with no large bowel wall thickening or significant pericolonic fat stranding. Moderate colorectal stool volume. Vascular/Lymphatic: Atherosclerotic nonaneurysmal abdominal aorta.  Patent portal, splenic, hepatic and renal veins. No pathologically enlarged lymph nodes in the abdomen or pelvis. Reproductive: Mildly enlarged prostate. Post TURP changes in the prostate. Other: No pneumoperitoneum, ascites or focal fluid collection. Small fat containing midline supraumbilical ventral abdominal hernia, stable. Musculoskeletal: No aggressive appearing focal osseous lesions. Partially visualized fixation hardware in the proximal left femur. Moderate lumbar spondylosis. IMPRESSION: 1. Stable left perihilar radiation fibrosis without evidence of local tumor recurrence. 2. Patchy peribronchovascular ground-glass opacity and interstitial thickening throughout both lungs is stable to slightly improved since 04/29/2018 and clearly improved since 02/11/2018 CT. These findings are nonspecific with differential including lymphangitic tumor or drug toxicity. 3. Stable mediastinal lymphadenopathy. 4. No new or progressive metastatic disease. 5. Small dependent left pleural effusion. 6. Aortic Atherosclerosis (ICD10-I70.0) and Emphysema (ICD10-J43.9). Electronically Signed   By: JIlona SorrelM.D.   On: 07/01/2018 11:01   Ct Abdomen Pelvis W Contrast  Result Date: 07/01/2018 CLINICAL DATA:  History of left lower lobe non-small cell lung cancer status post wedge resection and concurrent chemoradiation therapy in 2016. Recurrent metastatic disease to the mediastinal and supraclavicular lymph nodes and left femur in 2018. Ongoing immunotherapy. Restaging. EXAM: CT CHEST, ABDOMEN, AND PELVIS WITH CONTRAST TECHNIQUE: Multidetector CT imaging of the chest, abdomen and pelvis was performed following the standard protocol during bolus administration of intravenous contrast. CONTRAST:  1057mOMNIPAQUE IOHEXOL 300 MG/ML  SOLN COMPARISON:  04/29/2018 CT chest, abdomen and pelvis. FINDINGS: CT CHEST FINDINGS Cardiovascular: Top-normal heart size. Stable small pericardial effusion/thickening. Three-vessel coronary  atherosclerosis. Right internal jugular Port-A-Cath terminates at the cavoatrial junction. Atherosclerotic nonaneurysmal thoracic aorta. Stable dilated main pulmonary artery (3.6 cm diameter). No central pulmonary emboli. Mediastinum/Nodes: Stable coarse subcentimeter posterior right thyroid lobe calcification. Unremarkable esophagus. No axillary adenopathy. Right paratracheal and subcarinal lymphadenopathy is not appreciably changed. Representative 1.9 cm right paratracheal node (series 2/image 27), previously 1.9 cm using similar measurement technique, stable. Enlarged node (series 2/1.5 cm subcarinal image 37), previously 1.6 cm, not appreciably changed. No new pathologically enlarged mediastinal nodes. No pathologically enlarged hilar nodes. Lungs/Pleura: No pneumothorax. Small dependent left pleural effusion. No right pleural effusion. Severe centrilobular and paraseptal emphysema with diffuse bronchial wall thickening. Sharply marginated parahilar consolidation in the left mid lung with associated volume loss, bronchiectasis and distortion, compatible with radiation fibrosis, stable. No acute consolidative airspace disease or lung masses. There is patchy peribronchovascular interstitial thickening and ground-glass opacity throughout both lungs involving all lung lobes, stable to the slightly improved in the interval. No new significant pulmonary nodules. Musculoskeletal: No aggressive appearing focal osseous lesions. Stable post thoracotomy change in the left mid ribs. Moderate thoracic spondylosis. CT ABDOMEN PELVIS FINDINGS Hepatobiliary: Normal liver size. Subcentimeter hypodense lateral segment left liver lesion, too small characterize, stable since at least 2018, considered benign. No new liver lesions. Cholelithiasis. No biliary ductal dilatation. Pancreas: Normal, with no mass or duct dilation. Spleen: Normal size. No mass. Adrenals/Urinary Tract: Stable 1.2 cm right adrenal nodule since 01/12/2017  PET-CT, most compatible with a benign nodule. No new adrenal nodules. No hydronephrosis. Simple left renal cysts, largest 2.2 cm in the lower left kidney. Normal bladder. Stomach/Bowel: Normal non-distended stomach. Normal caliber small bowel with no small bowel wall thickening. Normal appendix. Mild left colonic diverticulosis, with no large bowel wall thickening or significant pericolonic fat stranding. Moderate colorectal stool volume. Vascular/Lymphatic: Atherosclerotic nonaneurysmal abdominal aorta. Patent portal, splenic, hepatic and renal veins. No pathologically enlarged lymph nodes in the abdomen or pelvis. Reproductive: Mildly enlarged prostate. Post TURP changes in the prostate. Other: No pneumoperitoneum, ascites or focal fluid collection. Small fat containing midline supraumbilical ventral abdominal hernia, stable. Musculoskeletal: No aggressive appearing focal osseous lesions. Partially visualized fixation hardware in the proximal left femur. Moderate lumbar spondylosis. IMPRESSION: 1. Stable left perihilar radiation fibrosis without evidence of local tumor recurrence. 2. Patchy peribronchovascular ground-glass opacity and interstitial thickening throughout both lungs is stable to slightly improved since 04/29/2018 and clearly improved since 02/11/2018 CT. These findings are nonspecific with differential including lymphangitic tumor or drug toxicity. 3. Stable mediastinal lymphadenopathy. 4. No new or progressive metastatic disease. 5. Small dependent left pleural effusion. 6. Aortic Atherosclerosis (ICD10-I70.0) and Emphysema (ICD10-J43.9). Electronically Signed   By: Ilona Sorrel M.D.   On: 07/01/2018 11:01    ASSESSMENT AND PLAN: This is a 68 years old white male with metastatic non-small cell lung cancer, adenocarcinoma with no actionable mutations and PDL 1 expression of 5% that was initially diagnosed as stage IIIa non-small cell lung cancer, adenocarcinoma status post left lower lobectomy  with lymph node dissection followed by a course of concurrent chemoradiation completed in January 2016.  The patient had evidence for disease metastasis in October 2018 with metastatic disease to the left femur as well as left supraclavicular and right paratracheal lymph nodes. The patient is currently on systemic chemotherapy initially was with carboplatin, Alimta and Keytruda.  Carboplatin was discontinued secondary to hypersensitivity reaction  starting from cycle #2.   He was also treated with 3 cycles of maintenance Alimta and Ketruda (pembrolizumab) but Alimta was discontinued secondary to intolerance. He is currently undergoing treatment with maintenance Keytruda as a single agent status post 19 cycles.  He continues to tolerate this treatment well with no concerning adverse effects. I recommended for the patient to proceed with cycle #20 today as scheduled. I will see him back for follow-up visit in 3 weeks for evaluation before the next cycle of his treatment. He was advised to call immediately if he has any concerning symptoms in the interval. The patient voices understanding of current disease status and treatment options and is in agreement with the current care plan. All questions were answered. The patient knows to call the clinic with any problems, questions or concerns. We can certainly see the patient much sooner if necessary.  Disclaimer: This note was dictated with voice recognition software. Similar sounding words can inadvertently be transcribed and may not be corrected upon review.

## 2018-08-01 ENCOUNTER — Encounter: Payer: Self-pay | Admitting: Oncology

## 2018-08-14 ENCOUNTER — Other Ambulatory Visit: Payer: Medicare HMO

## 2018-08-14 ENCOUNTER — Ambulatory Visit: Payer: Medicare HMO

## 2018-08-14 ENCOUNTER — Ambulatory Visit: Payer: Medicare HMO | Admitting: Internal Medicine

## 2018-08-19 ENCOUNTER — Other Ambulatory Visit: Payer: Self-pay | Admitting: *Deleted

## 2018-08-19 DIAGNOSIS — Z5112 Encounter for antineoplastic immunotherapy: Secondary | ICD-10-CM

## 2018-08-21 ENCOUNTER — Inpatient Hospital Stay: Payer: Medicare HMO

## 2018-08-21 ENCOUNTER — Other Ambulatory Visit: Payer: Self-pay

## 2018-08-21 ENCOUNTER — Encounter: Payer: Self-pay | Admitting: Internal Medicine

## 2018-08-21 ENCOUNTER — Inpatient Hospital Stay (HOSPITAL_BASED_OUTPATIENT_CLINIC_OR_DEPARTMENT_OTHER): Payer: Medicare HMO | Admitting: Internal Medicine

## 2018-08-21 ENCOUNTER — Inpatient Hospital Stay: Payer: Medicare HMO | Attending: Internal Medicine

## 2018-08-21 DIAGNOSIS — C7951 Secondary malignant neoplasm of bone: Secondary | ICD-10-CM | POA: Insufficient documentation

## 2018-08-21 DIAGNOSIS — Z79899 Other long term (current) drug therapy: Secondary | ICD-10-CM | POA: Diagnosis not present

## 2018-08-21 DIAGNOSIS — C3432 Malignant neoplasm of lower lobe, left bronchus or lung: Secondary | ICD-10-CM | POA: Insufficient documentation

## 2018-08-21 DIAGNOSIS — Z5112 Encounter for antineoplastic immunotherapy: Secondary | ICD-10-CM | POA: Diagnosis present

## 2018-08-21 DIAGNOSIS — Z95828 Presence of other vascular implants and grafts: Secondary | ICD-10-CM

## 2018-08-21 DIAGNOSIS — Z902 Acquired absence of lung [part of]: Secondary | ICD-10-CM | POA: Diagnosis not present

## 2018-08-21 DIAGNOSIS — Z9221 Personal history of antineoplastic chemotherapy: Secondary | ICD-10-CM | POA: Diagnosis not present

## 2018-08-21 DIAGNOSIS — Z923 Personal history of irradiation: Secondary | ICD-10-CM

## 2018-08-21 DIAGNOSIS — E039 Hypothyroidism, unspecified: Secondary | ICD-10-CM | POA: Diagnosis not present

## 2018-08-21 DIAGNOSIS — C3492 Malignant neoplasm of unspecified part of left bronchus or lung: Secondary | ICD-10-CM

## 2018-08-21 DIAGNOSIS — C3490 Malignant neoplasm of unspecified part of unspecified bronchus or lung: Secondary | ICD-10-CM

## 2018-08-21 LAB — CMP (CANCER CENTER ONLY)
ALT: 24 U/L (ref 0–44)
AST: 21 U/L (ref 15–41)
Albumin: 4 g/dL (ref 3.5–5.0)
Alkaline Phosphatase: 93 U/L (ref 38–126)
Anion gap: 11 (ref 5–15)
BUN: 15 mg/dL (ref 8–23)
CO2: 22 mmol/L (ref 22–32)
Calcium: 8.9 mg/dL (ref 8.9–10.3)
Chloride: 106 mmol/L (ref 98–111)
Creatinine: 0.93 mg/dL (ref 0.61–1.24)
GFR, Est AFR Am: >60 mL/min (ref >60)
GFR, Estimated: >60 mL/min (ref >60)
Glucose, Bld: 126 mg/dL — ABNORMAL HIGH (ref 70–99)
Potassium: 4.4 mmol/L (ref 3.5–5.1)
Sodium: 139 mmol/L (ref 135–145)
Total Bilirubin: 0.8 mg/dL (ref 0.3–1.2)
Total Protein: 6.6 g/dL (ref 6.5–8.1)

## 2018-08-21 LAB — CBC WITH DIFFERENTIAL (CANCER CENTER ONLY)
Abs Immature Granulocytes: 0.04 10*3/uL (ref 0.00–0.07)
Basophils Absolute: 0 10*3/uL (ref 0.0–0.1)
Basophils Relative: 0 %
Eosinophils Absolute: 0.1 10*3/uL (ref 0.0–0.5)
Eosinophils Relative: 2 %
HCT: 46.6 % (ref 39.0–52.0)
Hemoglobin: 16.5 g/dL (ref 13.0–17.0)
Immature Granulocytes: 1 %
Lymphocytes Relative: 19 %
Lymphs Abs: 1.5 10*3/uL (ref 0.7–4.0)
MCH: 33.7 pg (ref 26.0–34.0)
MCHC: 35.4 g/dL (ref 30.0–36.0)
MCV: 95.3 fL (ref 80.0–100.0)
Monocytes Absolute: 0.7 10*3/uL (ref 0.1–1.0)
Monocytes Relative: 9 %
Neutro Abs: 5.8 10*3/uL (ref 1.7–7.7)
Neutrophils Relative %: 69 %
Platelet Count: 156 10*3/uL (ref 150–400)
RBC: 4.89 MIL/uL (ref 4.22–5.81)
RDW: 13 % (ref 11.5–15.5)
WBC Count: 8.3 10*3/uL (ref 4.0–10.5)
nRBC: 0 % (ref 0.0–0.2)

## 2018-08-21 LAB — TSH: TSH: 0.597 u[IU]/mL (ref 0.320–4.118)

## 2018-08-21 MED ORDER — SODIUM CHLORIDE 0.9 % IV SOLN
200.0000 mg | Freq: Once | INTRAVENOUS | Status: AC
  Administered 2018-08-21: 200 mg via INTRAVENOUS
  Filled 2018-08-21: qty 8

## 2018-08-21 MED ORDER — SODIUM CHLORIDE 0.9 % IV SOLN
Freq: Once | INTRAVENOUS | Status: AC
  Administered 2018-08-21: 10:00:00 via INTRAVENOUS
  Filled 2018-08-21: qty 250

## 2018-08-21 MED ORDER — SODIUM CHLORIDE 0.9% FLUSH
10.0000 mL | INTRAVENOUS | Status: DC | PRN
  Administered 2018-08-21: 11:00:00 10 mL
  Filled 2018-08-21: qty 10

## 2018-08-21 MED ORDER — HEPARIN SOD (PORK) LOCK FLUSH 100 UNIT/ML IV SOLN
500.0000 [IU] | Freq: Once | INTRAVENOUS | Status: AC | PRN
  Administered 2018-08-21: 500 [IU]
  Filled 2018-08-21: qty 5

## 2018-08-21 MED ORDER — SODIUM CHLORIDE 0.9% FLUSH
10.0000 mL | INTRAVENOUS | Status: DC | PRN
  Administered 2018-08-21: 10 mL
  Filled 2018-08-21: qty 10

## 2018-08-21 NOTE — Patient Instructions (Signed)
[facility] Discharge Instructions for Patients Receiving Chemotherapy  Today you received the following chemotherapy agents:  Keytruda.  To help prevent nausea and vomiting after your treatment, we encourage you to take your nausea medication as directed.   If you develop nausea and vomiting that is not controlled by your nausea medication, call the clinic.   BELOW ARE SYMPTOMS THAT SHOULD BE REPORTED IMMEDIATELY:  *FEVER GREATER THAN 100.5 F  *CHILLS WITH OR WITHOUT FEVER  NAUSEA AND VOMITING THAT IS NOT CONTROLLED WITH YOUR NAUSEA MEDICATION  *UNUSUAL SHORTNESS OF BREATH  *UNUSUAL BRUISING OR BLEEDING  TENDERNESS IN MOUTH AND THROAT WITH OR WITHOUT PRESENCE OF ULCERS  *URINARY PROBLEMS  *BOWEL PROBLEMS  UNUSUAL RASH Items with * indicate a potential emergency and should be followed up as soon as possible.  Feel free to call the clinic should you have any questions or concerns. The clinic phone number is [phone].  Please show the CHEMO ALERT CARD at check-in to the Emergency Department and triage nurse.    

## 2018-08-21 NOTE — Progress Notes (Signed)
[place] [phone]) [phone]   [phone]) [phone]  OFFICE PROGRESS NOTE  [name], MD [address], [place] [place] [address]  DIAGNOSIS: Metastatic non-small cell lung cancer initially diagnosed as stage IIIA (T2a, N2, M0) non-small cell lung cancer, poorly differentiated adenocarcinoma presented with left lower lobe lung mass in addition to mediastinal lymphadenopathy.  The patient was diagnosed with metastatic disease involving the left femur as well as left supraclavicular nodal metastases and right paratracheal lymphadenopathy in October 2018.  Biomarker Findings Microsatellite Status - MS-Stable Tumor Mutational Burden - TMB-Low (3 Muts/Mb) Genomic Findings For a complete list of the genes assayed, please refer to the Appendix. STK11 P220f*6 [id number] site 1606+3K>ZDAXX E374* MLL2 [id number] NBN K23352f NOTCH2 [id number] splice site [id number]+5D>DDisease relevant genes with no reportable alterations: EGFR, KRAS, ALK, BRAF, MET, RET, ERBB2, ROS1   PDL1 expression 5%  PRIOR THERAPY: 1) status post wedge resection of the left lower lobe lung mass as well as AP window lymph node dissection but there was residual metastatic mediastinal lymphadenopathy that could not be resected. 2) a course of concurrent chemoradiation with weekly carboplatin [place].  3) status post palliative radiotherapy to the left femur metastatic bone disease. 4)  Systemic chemotherapy with carboplatin for AUC of 5, Alimta 500 mg/M2 and Keytruda 200 mg IV every 3 weeks.  First dose March 07, 2017.  Carboplatin was discontinued during cycle #2 secondary to hypersensitivity reaction. 5) status post 2 cycles of maintenance treatment with Alimta and Ketruda (pembrolizumab).  Alimta was discontinued secondary to intolerance.  CURRENT THERAPY: Maintenance treatment with single agent Ketruda (pembrolizumab) status post 20   cycles.  INTERVAL HISTORY: Victor Huber 68o. male returns to the clinic today for follow-up visit.  The patient is feeling fine today with no concerning complaints except for mild increase of cough and shortness of breath with exertion.  He denied having any chest pain or hemoptysis.  He has no fever or chills.  He denied having any significant weight loss or night sweats.  He has mild rash in the face.  He continues to tolerate his treatment with Keytruda fairly well.  He is here for evaluation before starting cycle #21.  MEDICAL HISTORY: Past Medical History:  Diagnosis Date  . Adenocarcinoma of left lung, stage 3 (HCCBuford/16/2018  . Arthritis   . Atrial fibrillation (HCCCuming/16/2018  . Atrial fibrillation (HCCPoynette . COPD (chronic obstructive pulmonary disease) (HCCDanielson/16/2018  . History of chemotherapy   . History of radiation therapy   . Hyperlipidemia   . Hypothyroid 08/15/2016  . Longstanding persistent atrial fibrillation 08/29/2016  . Pathologic fracture    left femur  . Pneumonitis   . S/P TURP 08/15/2016  . Wears glasses   . Wears hearing aid in both ears     ALLERGIES:  is allergic to carboplatin and flecainide.  MEDICATIONS:  Current Outpatient Medications  Medication Sig Dispense Refill  . apixaban (ELIQUIS) 5 MG TABS tablet Take 1 tablet (5 mg total) by mouth 2 (two) times daily. 180 tablet 3  . atenolol (TENORMIN) 50 MG tablet Take 1 tablet (50 mg total) by mouth daily. 90 tablet 3  . atorvastatin (LIPITOR) 40 MG tablet Take 40 mg by mouth daily.    . citalopram (CELEXA) 40 MG tablet Take 40 mg by mouth daily.    . iMarland Kitchenuprofen (ADVIL,MOTRIN) 400 MG tablet Take 400 mg by  mouth every 4 (four) hours as needed for moderate pain.    Marland Kitchen levothyroxine (SYNTHROID, LEVOTHROID) 150 MCG tablet Take 150 mcg by mouth daily.    . tamsulosin (FLOMAX) 0.4 MG CAPS capsule Take 0.4 mg daily by mouth.      No current facility-administered medications for this visit.     SURGICAL  HISTORY:  Past Surgical History:  Procedure Laterality Date  . BRONCHOSCOPY  10/2014  . CARDIAC CATHETERIZATION     05/07/12  . CARDIOVERSION     x2  . COLONOSCOPY    . DG BIOPSY LUNG Left 10/2014   FNA - Adenocarcinoma   . FEMUR IM NAIL Left 02/19/2017  . FEMUR IM NAIL Left 02/19/2017   Procedure: INTRAMEDULLARY (IM) NAIL FEMORAL;  Surgeon: Marchia Bond, MD;  Location: [facility];  Service: Orthopedics;  Laterality: Left;  . IR FLUORO GUIDE PORT INSERTION RIGHT  07/03/2017  . IR US GUIDE VASC ACCESS RIGHT  07/03/2017  . LUNG CANCER SURGERY Left 12/2014   Wedge Resection   . MULTIPLE TOOTH EXTRACTIONS    . Status post TURP    . TONSILLECTOMY      REVIEW OF SYSTEMS:  A comprehensive review of systems was negative except for: Constitutional: positive for fatigue Respiratory: positive for dyspnea on exertion Integument/breast: positive for rash   PHYSICAL EXAMINATION: General appearance: alert, cooperative, fatigued and no distress Head: Normocephalic, without obvious abnormality, atraumatic Neck: no adenopathy, no JVD, supple, symmetrical, trachea midline and thyroid not enlarged, symmetric, no tenderness/mass/nodules Lymph nodes: Cervical, supraclavicular, and axillary nodes normal. Resp: clear to auscultation bilaterally Back: symmetric, no curvature. ROM normal. No CVA tenderness. Cardio: regular rate and rhythm, S1, S2 normal, no murmur, click, rub or gallop GI: soft, non-tender; bowel sounds normal; no masses,  no organomegaly Extremities: extremities normal, atraumatic, no cyanosis or edema  ECOG PERFORMANCE STATUS: 1 - Symptomatic but completely ambulatory  Blood pressure 116/81, pulse 83, temperature 98.1 F (36.7 C), temperature source Oral, resp. rate 18, height '6\' 1"'$  (1.854 m), weight 249 lb 4.8 oz (113.1 kg), SpO2 95 %.  LABORATORY DATA: Lab Results  Component Value Date   WBC 8.3 08/21/2018   HGB 16.5 08/21/2018   HCT 46.6 08/21/2018   MCV 95.3 08/21/2018   PLT 156  08/21/2018      Chemistry      Component Value Date/Time   NA 140 07/24/2018 1005   NA 136 04/04/2017 1141   K 4.5 07/24/2018 1005   K 4.4 04/04/2017 1141   CL 106 07/24/2018 1005   CO2 24 07/24/2018 1005   CO2 24 04/04/2017 1141   BUN 15 07/24/2018 1005   BUN 21.1 04/04/2017 1141   CREATININE 0.92 07/24/2018 1005   CREATININE 1.1 04/04/2017 1141      Component Value Date/Time   CALCIUM 9.2 07/24/2018 1005   CALCIUM 9.1 04/04/2017 1141   ALKPHOS 93 07/24/2018 1005   ALKPHOS 89 04/04/2017 1141   AST 18 07/24/2018 1005   AST 17 04/04/2017 1141   ALT 20 07/24/2018 1005   ALT 18 04/04/2017 1141   BILITOT 1.0 07/24/2018 1005   BILITOT 0.67 04/04/2017 1141       RADIOGRAPHIC STUDIES: No results found.  ASSESSMENT AND PLAN: This is a 68 years old white male with metastatic non-small cell lung cancer, adenocarcinoma with no actionable mutations and PDL 1 expression of 5% that was initially diagnosed as stage IIIa non-small cell lung cancer, adenocarcinoma status post left lower lobectomy with lymph node  dissection followed by a course of concurrent chemoradiation completed in January 2016.  The patient had evidence for disease metastasis in October 2018 with metastatic disease to the left femur as well as left supraclavicular and right paratracheal lymph nodes. The patient is currently on systemic chemotherapy initially was with carboplatin, Alimta and Keytruda.  Carboplatin was discontinued secondary to hypersensitivity reaction starting from cycle #2.   He was also treated with 3 cycles of maintenance Alimta and Ketruda (pembrolizumab) but Alimta was discontinued secondary to intolerance. He is currently undergoing treatment with maintenance Keytruda as a single agent status post 20 cycles.  He continues to tolerate the treatment well. I recommended for him to proceed with cycle #21 today as planned. I will see him back for follow-up visit in 3 weeks for evaluation after repeating  CT scan of the chest, abdomen and pelvis for restaging of his disease. The patient was advised to call immediately if he has any concerning symptoms in the interval. The patient voices understanding of current disease status and treatment options and is in agreement with the current care plan. All questions were answered. The patient knows to call the clinic with any problems, questions or concerns. We can certainly see the patient much sooner if necessary.  Disclaimer: This note was dictated with voice recognition software. Similar sounding words can inadvertently be transcribed and may not be corrected upon review.

## 2018-09-02 ENCOUNTER — Telehealth: Payer: Self-pay | Admitting: Internal Medicine

## 2018-09-02 NOTE — Telephone Encounter (Signed)
Scheduled appt per 5/26 los - pt is aware of appt date and time  Next treatment on 6/11

## 2018-09-04 ENCOUNTER — Ambulatory Visit: Payer: Medicare HMO | Admitting: Internal Medicine

## 2018-09-04 ENCOUNTER — Other Ambulatory Visit: Payer: Medicare HMO

## 2018-09-04 ENCOUNTER — Ambulatory Visit: Payer: Medicare HMO

## 2018-09-10 ENCOUNTER — Ambulatory Visit (HOSPITAL_COMMUNITY): Admission: RE | Admit: 2018-09-10 | Payer: Medicare HMO | Source: Ambulatory Visit

## 2018-09-11 ENCOUNTER — Encounter: Payer: Self-pay | Admitting: Internal Medicine

## 2018-09-11 ENCOUNTER — Inpatient Hospital Stay (HOSPITAL_BASED_OUTPATIENT_CLINIC_OR_DEPARTMENT_OTHER): Payer: Medicare HMO | Admitting: Internal Medicine

## 2018-09-11 ENCOUNTER — Other Ambulatory Visit: Payer: Self-pay | Admitting: *Deleted

## 2018-09-11 ENCOUNTER — Inpatient Hospital Stay: Payer: Medicare HMO | Attending: Internal Medicine

## 2018-09-11 ENCOUNTER — Other Ambulatory Visit: Payer: Self-pay

## 2018-09-11 ENCOUNTER — Inpatient Hospital Stay: Payer: Medicare HMO

## 2018-09-11 VITALS — BP 114/87 | HR 91 | Temp 98.5°F | Resp 18 | Ht 73.0 in | Wt 248.2 lb

## 2018-09-11 DIAGNOSIS — C7951 Secondary malignant neoplasm of bone: Secondary | ICD-10-CM | POA: Insufficient documentation

## 2018-09-11 DIAGNOSIS — E039 Hypothyroidism, unspecified: Secondary | ICD-10-CM | POA: Diagnosis not present

## 2018-09-11 DIAGNOSIS — Z902 Acquired absence of lung [part of]: Secondary | ICD-10-CM | POA: Insufficient documentation

## 2018-09-11 DIAGNOSIS — C3492 Malignant neoplasm of unspecified part of left bronchus or lung: Secondary | ICD-10-CM

## 2018-09-11 DIAGNOSIS — C3432 Malignant neoplasm of lower lobe, left bronchus or lung: Secondary | ICD-10-CM | POA: Diagnosis not present

## 2018-09-11 DIAGNOSIS — Z9221 Personal history of antineoplastic chemotherapy: Secondary | ICD-10-CM

## 2018-09-11 DIAGNOSIS — Z923 Personal history of irradiation: Secondary | ICD-10-CM | POA: Diagnosis not present

## 2018-09-11 DIAGNOSIS — Z79899 Other long term (current) drug therapy: Secondary | ICD-10-CM | POA: Insufficient documentation

## 2018-09-11 DIAGNOSIS — Z5112 Encounter for antineoplastic immunotherapy: Secondary | ICD-10-CM

## 2018-09-11 DIAGNOSIS — Z95828 Presence of other vascular implants and grafts: Secondary | ICD-10-CM

## 2018-09-11 LAB — CMP (CANCER CENTER ONLY)
ALT: 24 U/L (ref 0–44)
AST: 26 U/L (ref 15–41)
Albumin: 4.1 g/dL (ref 3.5–5.0)
Alkaline Phosphatase: 100 U/L (ref 38–126)
Anion gap: 8 (ref 5–15)
BUN: 16 mg/dL (ref 8–23)
CO2: 24 mmol/L (ref 22–32)
Calcium: 9 mg/dL (ref 8.9–10.3)
Chloride: 108 mmol/L (ref 98–111)
Creatinine: 0.95 mg/dL (ref 0.61–1.24)
GFR, Est AFR Am: >60 mL/min (ref >60)
GFR, Estimated: >60 mL/min (ref >60)
Glucose, Bld: 118 mg/dL — ABNORMAL HIGH (ref 70–99)
Potassium: 4.7 mmol/L (ref 3.5–5.1)
Sodium: 140 mmol/L (ref 135–145)
Total Bilirubin: 0.9 mg/dL (ref 0.3–1.2)
Total Protein: 6.6 g/dL (ref 6.5–8.1)

## 2018-09-11 LAB — CBC WITH DIFFERENTIAL (CANCER CENTER ONLY)
Abs Immature Granulocytes: 0.01 10*3/uL (ref 0.00–0.07)
Basophils Absolute: 0 10*3/uL (ref 0.0–0.1)
Basophils Relative: 0 %
Eosinophils Absolute: 0.1 10*3/uL (ref 0.0–0.5)
Eosinophils Relative: 1 %
HCT: 46.3 % (ref 39.0–52.0)
Hemoglobin: 16.1 g/dL (ref 13.0–17.0)
Immature Granulocytes: 0 %
Lymphocytes Relative: 17 %
Lymphs Abs: 1.3 10*3/uL (ref 0.7–4.0)
MCH: 33.8 pg (ref 26.0–34.0)
MCHC: 34.8 g/dL (ref 30.0–36.0)
MCV: 97.3 fL (ref 80.0–100.0)
Monocytes Absolute: 0.6 10*3/uL (ref 0.1–1.0)
Monocytes Relative: 8 %
Neutro Abs: 5.4 10*3/uL (ref 1.7–7.7)
Neutrophils Relative %: 74 %
Platelet Count: 151 10*3/uL (ref 150–400)
RBC: 4.76 MIL/uL (ref 4.22–5.81)
RDW: 13.3 % (ref 11.5–15.5)
WBC Count: 7.5 10*3/uL (ref 4.0–10.5)
nRBC: 0 % (ref 0.0–0.2)

## 2018-09-11 LAB — TSH: TSH: 0.22 u[IU]/mL — ABNORMAL LOW (ref 0.320–4.118)

## 2018-09-11 MED ORDER — SODIUM CHLORIDE 0.9 % IV SOLN
Freq: Once | INTRAVENOUS | Status: AC
  Administered 2018-09-11: 11:00:00 via INTRAVENOUS
  Filled 2018-09-11: qty 250

## 2018-09-11 MED ORDER — HEPARIN SOD (PORK) LOCK FLUSH 100 UNIT/ML IV SOLN
500.0000 [IU] | Freq: Once | INTRAVENOUS | Status: AC | PRN
  Administered 2018-09-11: 13:00:00 500 [IU]
  Filled 2018-09-11: qty 5

## 2018-09-11 MED ORDER — SODIUM CHLORIDE 0.9% FLUSH
10.0000 mL | INTRAVENOUS | Status: DC | PRN
  Administered 2018-09-11: 13:00:00 10 mL
  Filled 2018-09-11: qty 10

## 2018-09-11 MED ORDER — SODIUM CHLORIDE 0.9 % IV SOLN
200.0000 mg | Freq: Once | INTRAVENOUS | Status: AC
  Administered 2018-09-11: 200 mg via INTRAVENOUS
  Filled 2018-09-11: qty 8

## 2018-09-11 MED ORDER — SODIUM CHLORIDE 0.9% FLUSH
10.0000 mL | INTRAVENOUS | Status: DC | PRN
  Administered 2018-09-11: 10 mL
  Filled 2018-09-11: qty 10

## 2018-09-11 NOTE — Progress Notes (Signed)
[facility] Telephone:(336) [phone]   Fax:(336) [phone]  OFFICE PROGRESS NOTE  [name], MD [address] [place] [address]  DIAGNOSIS: Metastatic non-small cell lung cancer initially diagnosed as stage IIIA (T2a, N2, M0) non-small cell lung cancer, poorly differentiated adenocarcinoma presented with left lower lobe lung mass in addition to mediastinal lymphadenopathy.  The patient was diagnosed with metastatic disease involving the left femur as well as left supraclavicular nodal metastases and right paratracheal lymphadenopathy in October 2018.  Biomarker Findings Microsatellite Status - MS-Stable Tumor Mutational Burden - TMB-Low (3 Muts/Mb) Genomic Findings For a complete list of the genes assayed, please refer to the Appendix. STK11 P266f*6 [id number] site 1381+7R>NDAXX E374* MLL2 V45313f40 NBN K23385f NOTCH2 [id number] splice site 988383+3X>ODisease relevant genes with no reportable alterations: EGFR, KRAS, ALK, BRAF, MET, RET, ERBB2, ROS1   PDL1 expression 5%  PRIOR THERAPY: 1) status post wedge resection of the left lower lobe lung mass as well as AP window lymph node dissection but there was residual metastatic mediastinal lymphadenopathy that could not be resected. 2) a course of concurrent chemoradiation with weekly carboplatin and paclitaxel [place] 03/01/2015.  3) status post palliative radiotherapy to the left femur metastatic bone disease. 4)  Systemic chemotherapy with carboplatin for AUC of 5, Alimta 500 mg/M2 and Keytruda 200 mg IV every 3 weeks.  First dose March 07, 2017.  Carboplatin was discontinued during cycle #2 secondary to hypersensitivity reaction. 5) status post 2 cycles of maintenance treatment with Alimta and Ketruda (pembrolizumab).  Alimta was discontinued secondary to intolerance.  CURRENT THERAPY: Maintenance treatment with single agent Ketruda (pembrolizumab) status post 21   cycles.  INTERVAL HISTORY: Victor Huber 68o. male returns to the clinic today for follow-up visit.  The patient is feeling fine today with no concerning complaints except for the mild persistent shortness of breath with exertion as well as cough secondary to his history of COPD.  The patient denied having any chest pain or hemoptysis.  He denied having any fever or chills.  He has no weight loss or night sweats.  He denied having any nausea, vomiting, diarrhea or constipation.  He is here today for evaluation before starting cycle #22 of his maintenance Keytruda.  MEDICAL HISTORY: Past Medical History:  Diagnosis Date  . Adenocarcinoma of left lung, stage 3 (HCCKyle/16/2018  . Arthritis   . Atrial fibrillation (HCCVerdel/16/2018  . Atrial fibrillation (HCCHoopa . COPD (chronic obstructive pulmonary disease) (HCCBelknap/16/2018  . History of chemotherapy   . History of radiation therapy   . Hyperlipidemia   . Hypothyroid 08/15/2016  . Longstanding persistent atrial fibrillation 08/29/2016  . Pathologic fracture    left femur  . Pneumonitis   . S/P TURP 08/15/2016  . Wears glasses   . Wears hearing aid in both ears     ALLERGIES:  is allergic to carboplatin and flecainide.  MEDICATIONS:  Current Outpatient Medications  Medication Sig Dispense Refill  . apixaban (ELIQUIS) 5 MG TABS tablet Take 1 tablet (5 mg total) by mouth 2 (two) times daily. 180 tablet 3  . atenolol (TENORMIN) 50 MG tablet Take 1 tablet (50 mg total) by mouth daily. 90 tablet 3  . atorvastatin (LIPITOR) 40 MG tablet Take 40 mg by mouth daily.    . citalopram (CELEXA) 40 MG tablet Take 40 mg by mouth daily.    . iMarland Kitchenuprofen (ADVIL,MOTRIN) 400 MG tablet  Take 400 mg by mouth every 4 (four) hours as needed for moderate pain.    Marland Kitchen levothyroxine (SYNTHROID, LEVOTHROID) 150 MCG tablet Take 150 mcg by mouth daily.    . tamsulosin (FLOMAX) 0.4 MG CAPS capsule Take 0.4 mg daily by mouth.      No current facility-administered  medications for this visit.     SURGICAL HISTORY:  Past Surgical History:  Procedure Laterality Date  . BRONCHOSCOPY  10/2014  . CARDIAC CATHETERIZATION     05/07/12  . CARDIOVERSION     x2  . COLONOSCOPY    . DG BIOPSY LUNG Left 10/2014   FNA - Adenocarcinoma   . FEMUR IM NAIL Left 02/19/2017  . FEMUR IM NAIL Left 02/19/2017   Procedure: INTRAMEDULLARY (IM) NAIL FEMORAL;  Surgeon: Marchia Bond, MD;  Location: [facility];  Service: Orthopedics;  Laterality: Left;  . IR FLUORO GUIDE PORT INSERTION RIGHT  07/03/2017  . IR US GUIDE VASC ACCESS RIGHT  07/03/2017  . LUNG CANCER SURGERY Left 12/2014   Wedge Resection   . MULTIPLE TOOTH EXTRACTIONS    . Status post TURP    . TONSILLECTOMY      REVIEW OF SYSTEMS:  A comprehensive review of systems was negative except for: Constitutional: positive for fatigue Respiratory: positive for dyspnea on exertion   PHYSICAL EXAMINATION: General appearance: alert, cooperative, fatigued and no distress Head: Normocephalic, without obvious abnormality, atraumatic Neck: no adenopathy, no JVD, supple, symmetrical, trachea midline and thyroid not enlarged, symmetric, no tenderness/mass/nodules Lymph nodes: Cervical, supraclavicular, and axillary nodes normal. Resp: clear to auscultation bilaterally Back: symmetric, no curvature. ROM normal. No CVA tenderness. Cardio: regular rate and rhythm, S1, S2 normal, no murmur, click, rub or gallop GI: soft, non-tender; bowel sounds normal; no masses,  no organomegaly Extremities: extremities normal, atraumatic, no cyanosis or edema  ECOG PERFORMANCE STATUS: 1 - Symptomatic but completely ambulatory  Blood pressure 114/87, pulse 91, temperature 98.5 F (36.9 C), temperature source Oral, resp. rate 18, height 6' 1"  (1.854 m), weight 248 lb 3.2 oz (112.6 kg).  LABORATORY DATA: Lab Results  Component Value Date   WBC 7.5 09/11/2018   HGB 16.1 09/11/2018   HCT 46.3 09/11/2018   MCV 97.3 09/11/2018   PLT 151  09/11/2018      Chemistry      Component Value Date/Time   NA 139 08/21/2018 0815   NA 136 04/04/2017 1141   K 4.4 08/21/2018 0815   K 4.4 04/04/2017 1141   CL 106 08/21/2018 0815   CO2 22 08/21/2018 0815   CO2 24 04/04/2017 1141   BUN 15 08/21/2018 0815   BUN 21.1 04/04/2017 1141   CREATININE 0.93 08/21/2018 0815   CREATININE 1.1 04/04/2017 1141      Component Value Date/Time   CALCIUM 8.9 08/21/2018 0815   CALCIUM 9.1 04/04/2017 1141   ALKPHOS 93 08/21/2018 0815   ALKPHOS 89 04/04/2017 1141   AST 21 08/21/2018 0815   AST 17 04/04/2017 1141   ALT 24 08/21/2018 0815   ALT 18 04/04/2017 1141   BILITOT 0.8 08/21/2018 0815   BILITOT 0.67 04/04/2017 1141       RADIOGRAPHIC STUDIES: No results found.  ASSESSMENT AND PLAN: This is a 68 years old white male with metastatic non-small cell lung cancer, adenocarcinoma with no actionable mutations and PDL 1 expression of 5% that was initially diagnosed as stage IIIa non-small cell lung cancer, adenocarcinoma status post left lower lobectomy with lymph node dissection followed by  a course of concurrent chemoradiation completed in January 2016.  The patient had evidence for disease metastasis in October 2018 with metastatic disease to the left femur as well as left supraclavicular and right paratracheal lymph nodes. The patient is currently on systemic chemotherapy initially was with carboplatin, Alimta and Keytruda.  Carboplatin was discontinued secondary to hypersensitivity reaction starting from cycle #2.   He was also treated with 3 cycles of maintenance Alimta and Ketruda (pembrolizumab) but Alimta was discontinued secondary to intolerance. He is currently undergoing treatment with maintenance Keytruda as a single agent status post 21 cycles.  The patient continues to tolerate this treatment well with no concerning adverse effects. I recommended for him to proceed with cycle #22 today as planned. I will see him back for follow-up  visit in 3 weeks for evaluation after repeating CT scan of the chest, abdomen and pelvis for restaging of his disease. The patient was advised to call immediately if he has any concerning symptoms in the interval. The patient voices understanding of current disease status and treatment options and is in agreement with the current care plan. All questions were answered. The patient knows to call the clinic with any problems, questions or concerns. We can certainly see the patient much sooner if necessary.  Disclaimer: This note was dictated with voice recognition software. Similar sounding words can inadvertently be transcribed and may not be corrected upon review.

## 2018-09-11 NOTE — Patient Instructions (Signed)
[facility] Discharge Instructions for Patients Receiving Chemotherapy  Today you received the following chemotherapy agents:  Keytruda.  To help prevent nausea and vomiting after your treatment, we encourage you to take your nausea medication as directed.   If you develop nausea and vomiting that is not controlled by your nausea medication, call the clinic.   BELOW ARE SYMPTOMS THAT SHOULD BE REPORTED IMMEDIATELY:  *FEVER GREATER THAN 100.5 F  *CHILLS WITH OR WITHOUT FEVER  NAUSEA AND VOMITING THAT IS NOT CONTROLLED WITH YOUR NAUSEA MEDICATION  *UNUSUAL SHORTNESS OF BREATH  *UNUSUAL BRUISING OR BLEEDING  TENDERNESS IN MOUTH AND THROAT WITH OR WITHOUT PRESENCE OF ULCERS  *URINARY PROBLEMS  *BOWEL PROBLEMS  UNUSUAL RASH Items with * indicate a potential emergency and should be followed up as soon as possible.  Feel free to call the clinic should you have any questions or concerns. The clinic phone number is [phone].  Please show the CHEMO ALERT CARD at check-in to the Emergency Department and triage nurse.    

## 2018-09-18 ENCOUNTER — Other Ambulatory Visit: Payer: Self-pay

## 2018-09-18 ENCOUNTER — Ambulatory Visit (HOSPITAL_COMMUNITY)
Admission: RE | Admit: 2018-09-18 | Discharge: 2018-09-18 | Disposition: A | Discharge status: Home / Self Care | Payer: Medicare HMO | Source: Ambulatory Visit | Attending: Internal Medicine | Admitting: Internal Medicine

## 2018-09-18 DIAGNOSIS — C3490 Malignant neoplasm of unspecified part of unspecified bronchus or lung: Secondary | ICD-10-CM | POA: Diagnosis present

## 2018-09-18 MED ORDER — SODIUM CHLORIDE (PF) 0.9 % IJ SOLN
INTRAMUSCULAR | Status: AC
  Filled 2018-09-18: qty 50

## 2018-09-18 MED ORDER — IOHEXOL 300 MG/ML  SOLN: 75.0000 mL | Freq: Once | INTRAMUSCULAR | Status: DC | PRN

## 2018-09-18 MED ORDER — IOHEXOL 300 MG/ML  SOLN
100.0000 mL | Freq: Once | INTRAMUSCULAR | Status: AC | PRN
  Administered 2018-09-18: 100 mL via INTRAVENOUS

## 2018-09-25 ENCOUNTER — Other Ambulatory Visit: Payer: Medicare HMO

## 2018-09-25 ENCOUNTER — Ambulatory Visit: Payer: Medicare HMO

## 2018-09-25 ENCOUNTER — Ambulatory Visit: Payer: Medicare HMO | Admitting: Internal Medicine

## 2018-10-02 ENCOUNTER — Inpatient Hospital Stay: Payer: Medicare HMO

## 2018-10-02 ENCOUNTER — Inpatient Hospital Stay (HOSPITAL_BASED_OUTPATIENT_CLINIC_OR_DEPARTMENT_OTHER): Payer: Medicare HMO | Admitting: Physician Assistant

## 2018-10-02 ENCOUNTER — Other Ambulatory Visit: Payer: Self-pay

## 2018-10-02 ENCOUNTER — Inpatient Hospital Stay: Payer: Medicare HMO | Attending: Internal Medicine

## 2018-10-02 ENCOUNTER — Other Ambulatory Visit: Payer: Self-pay | Admitting: *Deleted

## 2018-10-02 ENCOUNTER — Encounter: Payer: Self-pay | Admitting: Physician Assistant

## 2018-10-02 VITALS — BP 105/73 | HR 72 | Temp 98.3°F | Resp 18 | Ht 73.0 in | Wt 251.2 lb

## 2018-10-02 DIAGNOSIS — C3492 Malignant neoplasm of unspecified part of left bronchus or lung: Secondary | ICD-10-CM

## 2018-10-02 DIAGNOSIS — Z902 Acquired absence of lung [part of]: Secondary | ICD-10-CM | POA: Insufficient documentation

## 2018-10-02 DIAGNOSIS — Z5112 Encounter for antineoplastic immunotherapy: Secondary | ICD-10-CM | POA: Insufficient documentation

## 2018-10-02 DIAGNOSIS — Z791 Long term (current) use of non-steroidal anti-inflammatories (NSAID): Secondary | ICD-10-CM | POA: Diagnosis not present

## 2018-10-02 DIAGNOSIS — Z79899 Other long term (current) drug therapy: Secondary | ICD-10-CM | POA: Insufficient documentation

## 2018-10-02 DIAGNOSIS — E785 Hyperlipidemia, unspecified: Secondary | ICD-10-CM | POA: Insufficient documentation

## 2018-10-02 DIAGNOSIS — Z9221 Personal history of antineoplastic chemotherapy: Secondary | ICD-10-CM | POA: Diagnosis not present

## 2018-10-02 DIAGNOSIS — C7951 Secondary malignant neoplasm of bone: Secondary | ICD-10-CM

## 2018-10-02 DIAGNOSIS — C3432 Malignant neoplasm of lower lobe, left bronchus or lung: Secondary | ICD-10-CM | POA: Diagnosis not present

## 2018-10-02 DIAGNOSIS — R21 Rash and other nonspecific skin eruption: Secondary | ICD-10-CM

## 2018-10-02 DIAGNOSIS — Z7901 Long term (current) use of anticoagulants: Secondary | ICD-10-CM | POA: Diagnosis not present

## 2018-10-02 DIAGNOSIS — R5383 Other fatigue: Secondary | ICD-10-CM | POA: Diagnosis not present

## 2018-10-02 DIAGNOSIS — E039 Hypothyroidism, unspecified: Secondary | ICD-10-CM

## 2018-10-02 DIAGNOSIS — I4891 Unspecified atrial fibrillation: Secondary | ICD-10-CM | POA: Diagnosis not present

## 2018-10-02 DIAGNOSIS — Z923 Personal history of irradiation: Secondary | ICD-10-CM | POA: Diagnosis not present

## 2018-10-02 DIAGNOSIS — J449 Chronic obstructive pulmonary disease, unspecified: Secondary | ICD-10-CM | POA: Insufficient documentation

## 2018-10-02 DIAGNOSIS — Z95828 Presence of other vascular implants and grafts: Secondary | ICD-10-CM

## 2018-10-02 LAB — CMP (CANCER CENTER ONLY)
ALT: 18 U/L (ref 0–44)
AST: 19 U/L (ref 15–41)
Albumin: 3.1 g/dL — ABNORMAL LOW (ref 3.5–5.0)
Alkaline Phosphatase: 72 U/L (ref 38–126)
Anion gap: 6 (ref 5–15)
BUN: 12 mg/dL (ref 8–23)
CO2: 20 mmol/L — ABNORMAL LOW (ref 22–32)
Calcium: 6.9 mg/dL — ABNORMAL LOW (ref 8.9–10.3)
Chloride: 115 mmol/L — ABNORMAL HIGH (ref 98–111)
Creatinine: 0.7 mg/dL (ref 0.61–1.24)
GFR, Est AFR Am: >60 mL/min (ref >60)
GFR, Estimated: >60 mL/min (ref >60)
Glucose, Bld: 82 mg/dL (ref 70–99)
Potassium: 3.6 mmol/L (ref 3.5–5.1)
Sodium: 141 mmol/L (ref 135–145)
Total Bilirubin: 0.7 mg/dL (ref 0.3–1.2)
Total Protein: 4.9 g/dL — ABNORMAL LOW (ref 6.5–8.1)

## 2018-10-02 LAB — CBC WITH DIFFERENTIAL (CANCER CENTER ONLY)
Abs Immature Granulocytes: 0.02 10*3/uL (ref 0.00–0.07)
Basophils Absolute: 0 10*3/uL (ref 0.0–0.1)
Basophils Relative: 0 %
Eosinophils Absolute: 0.1 10*3/uL (ref 0.0–0.5)
Eosinophils Relative: 2 %
HCT: 38.3 % — ABNORMAL LOW (ref 39.0–52.0)
Hemoglobin: 13.2 g/dL (ref 13.0–17.0)
Immature Granulocytes: 0 %
Lymphocytes Relative: 21 %
Lymphs Abs: 1.3 10*3/uL (ref 0.7–4.0)
MCH: 33.3 pg (ref 26.0–34.0)
MCHC: 34.5 g/dL (ref 30.0–36.0)
MCV: 96.7 fL (ref 80.0–100.0)
Monocytes Absolute: 0.5 10*3/uL (ref 0.1–1.0)
Monocytes Relative: 8 %
Neutro Abs: 4.3 10*3/uL (ref 1.7–7.7)
Neutrophils Relative %: 69 %
Platelet Count: 142 10*3/uL — ABNORMAL LOW (ref 150–400)
RBC: 3.96 MIL/uL — ABNORMAL LOW (ref 4.22–5.81)
RDW: 13.6 % (ref 11.5–15.5)
WBC Count: 6.2 10*3/uL (ref 4.0–10.5)
nRBC: 0 % (ref 0.0–0.2)

## 2018-10-02 MED ORDER — SODIUM CHLORIDE 0.9% FLUSH
10.0000 mL | INTRAVENOUS | Status: DC | PRN
  Administered 2018-10-02: 10 mL
  Filled 2018-10-02: qty 10

## 2018-10-02 MED ORDER — SODIUM CHLORIDE 0.9 % IV SOLN
Freq: Once | INTRAVENOUS | Status: AC
  Administered 2018-10-02: 14:00:00 via INTRAVENOUS
  Filled 2018-10-02: qty 250

## 2018-10-02 MED ORDER — SODIUM CHLORIDE 0.9 % IV SOLN
200.0000 mg | Freq: Once | INTRAVENOUS | Status: AC
  Administered 2018-10-02: 15:00:00 200 mg via INTRAVENOUS
  Filled 2018-10-02: qty 8

## 2018-10-02 MED ORDER — HEPARIN SOD (PORK) LOCK FLUSH 100 UNIT/ML IV SOLN
500.0000 [IU] | Freq: Once | INTRAVENOUS | Status: AC | PRN
  Administered 2018-10-02: 15:00:00 500 [IU]
  Filled 2018-10-02: qty 5

## 2018-10-02 NOTE — Progress Notes (Signed)
[facility] OFFICE PROGRESS NOTE  Victor Dy, MD [address] [place] [address]  DIAGNOSIS: Metastatic non-small cell lung cancer initially diagnosed as stage IIIA (T2a, N2, M0) non-small cell lung cancer, poorly differentiated adenocarcinoma presented with left lower lobe lung mass in addition to mediastinal lymphadenopathy.  The patient was diagnosed with metastatic disease involving the left femur as well as left supraclavicular nodal metastases and right paratracheal lymphadenopathy in October 2018.  Biomarker Findings Microsatellite Status - MS-Stable Tumor Mutational Burden - TMB-Low (3 Muts/Mb) Genomic Findings For a complete list of the genes assayed, please refer to the Appendix. STK11 P279f*6 [id number] site 1428+7G>ODAXX E374* MLL2 [id number] NBN K23327f NOTCH2 [id number] splice site [id number]+5D>HDisease relevant genes with no reportable alterations: EGFR, KRAS, ALK, BRAF, MET, RET, ERBB2, ROS1   PDL1 expression 5%  PRIOR THERAPY:  1) status post wedge resection of the left lower lobe lung mass as well as AP window lymph node dissection but there was residual metastatic mediastinal lymphadenopathy that could not be resected. 2) a course of concurrent chemoradiation [place] carbopl[place]  3) status post palliative radiotherapy to the left femur metastatic bone disease. 4)  Systemic chemotherapy with carboplatin for AUC of 5, Alimta 500 mg/M2 and Keytruda 200 mg IV every 3 weeks.  First dose March 07, 2017.  Carboplatin was discontinued during cycle #2 secondary to hypersensitivity reaction. 5) status post 2 cycles of maintenance treatment with Alimta and Ketruda (pembrolizumab).  Alimta was discontinued secondary to intolerance.  CURRENT THERAPY: Maintenance treatment with single agent Ketruda (pembrolizumab) status post 22 cycles.  INTERVAL HISTORY: Victor Huber 68o.  Huber returns to the clinic for a follow-up visit. The patient is feeling fairly well today without any concerning complaints except for some baseline shortness of breath with exertion and a dry cough. He continues to tolerate his treatment fairly well without any adverse effects except for some mild rashes on his cheeks/eyebrows and fatigue. He uses hydrocortisone cream for the itching with some relief. He denies any fever, chills, night sweats, or weight loss.  He denies any chest pain or hemoptysis.  He denies any nausea, vomiting, diarrhea, or constipation.  He denies any headache or visual changes.  The patient recently had a restaging CT scan performed.  He is here today for evaluation and to review his scan result before starting cycle #28.   MEDICAL HISTORY: Past Medical History:  Diagnosis Date  . Adenocarcinoma of left lung, stage 3 ([facility]/16/2018  . Arthritis   . Atrial fibrillation ([facility]/16/2018  . Atrial fibrillation ([facility] [facility] COPD (chronic obstructive pulmonary disease) ([facility]/16/2018  . History of chemotherapy   . History of radiation therapy   . Hyperlipidemia   . Hypothyroid 08/15/2016  . Longstanding persistent atrial fibrillation 08/29/2016  . Pathologic fracture    left femur  . Pneumonitis   . S/P TURP 08/15/2016  . Wears glasses   . Wears hearing aid in both ears     ALLERGIES:  is allergic to carboplatin and flecainide.  MEDICATIONS:  Current Outpatient Medications  Medication Sig Dispense Refill  . apixaban (ELIQUIS) 5 MG TABS tablet Take 1 tablet (5 mg total) by mouth 2 (two) times daily. 180 tablet 3  . atenolol (TENORMIN) 50 MG tablet Take 1 tablet (50 mg total) by mouth daily. 90 tablet 3  . atorvastatin (LIPITOR) 40 MG tablet Take 40 mg by mouth  daily.    . citalopram (CELEXA) 40 MG tablet Take 40 mg by mouth daily.    Marland Kitchen ibuprofen (ADVIL,MOTRIN) 400 MG tablet Take 400 mg by mouth every 4 (four) hours as needed for moderate pain.    Marland Kitchen levothyroxine  (SYNTHROID, LEVOTHROID) 150 MCG tablet Take 150 mcg by mouth daily.    . tamsulosin (FLOMAX) 0.4 MG CAPS capsule Take 0.4 mg daily by mouth.      No current facility-administered medications for this visit.    Facility-Administered Medications Ordered in Other Visits  Medication Dose Route Frequency Provider Last Rate Last Dose  . sodium chloride flush (NS) 0.9 % injection 10 mL  10 mL Intracatheter PRN Curt Bears, MD   10 mL at 10/02/18 1519    SURGICAL HISTORY:  Past Surgical History:  Procedure Laterality Date  . BRONCHOSCOPY  10/2014  . CARDIAC CATHETERIZATION     05/07/12  . CARDIOVERSION     x2  . COLONOSCOPY    . DG BIOPSY LUNG Left 10/2014   FNA - Adenocarcinoma   . FEMUR IM NAIL Left 02/19/2017  . FEMUR IM NAIL Left 02/19/2017   Procedure: INTRAMEDULLARY (IM) NAIL FEMORAL;  Surgeon: Marchia Bond, MD;  Location: [facility];  Service: Orthopedics;  Laterality: Left;  . IR FLUORO GUIDE PORT INSERTION RIGHT  07/03/2017  . IR US GUIDE VASC ACCESS RIGHT  07/03/2017  . LUNG CANCER SURGERY Left 12/2014   Wedge Resection   . MULTIPLE TOOTH EXTRACTIONS    . Status post TURP    . TONSILLECTOMY      REVIEW OF SYSTEMS:   Review of Systems  Constitutional: Positive for fatigue. Negative for appetite change, chills, fever and unexpected weight change.  HENT:   Negative for mouth sores, nosebleeds, sore throat and trouble swallowing.   Eyes: Negative for eye problems and icterus.  Respiratory: Positive for dry cough and shortness of breath with exertion. Negative for hemoptysis and wheezing.   Cardiovascular: Negative for chest pain and leg swelling.  Gastrointestinal: Negative for abdominal pain, constipation, diarrhea, nausea and vomiting.  Genitourinary: Negative for bladder incontinence, difficulty urinating, dysuria, frequency and hematuria.   Musculoskeletal: Negative for back pain, gait problem, neck pain and neck stiffness.  Skin: Positive for rash on his eyebrows and cheeks  with associated itching.  Neurological: Negative for dizziness, extremity weakness, gait problem, headaches, light-headedness and seizures.  Hematological: Negative for adenopathy. Does not bruise/bleed easily.  Psychiatric/Behavioral: Negative for confusion, depression and sleep disturbance. The patient is not nervous/anxious.     PHYSICAL EXAMINATION:  Blood pressure 105/73, pulse 72, temperature 98.3 F (36.8 C), temperature source Oral, resp. rate 18, height 6' 1"  (1.854 m), weight 251 lb 3.2 oz (113.9 kg), SpO2 94 %.  ECOG PERFORMANCE STATUS: 1 - Symptomatic but completely ambulatory  Physical Exam  Constitutional: Oriented to person, place, and time and well-developed, well-nourished, and in no distress.  HENT:  Head: Normocephalic and atraumatic.  Mouth/Throat: Oropharynx is clear and moist. No oropharyngeal exudate.  Eyes: Conjunctivae are normal. Right eye exhibits no discharge. Left eye exhibits no discharge. No scleral icterus.  Neck: Normal range of motion. Neck supple.  Cardiovascular: Irregular rhythm. Normal rate normal heart sounds and intact distal pulses.   Pulmonary/Chest: Effort normal and breath sounds normal. No respiratory distress. No wheezes. No rales.  Abdominal: Soft. Bowel sounds are normal. Exhibits no distension and no mass. There is no tenderness.  Musculoskeletal: Normal range of motion. Exhibits no edema.  Lymphadenopathy:  No cervical adenopathy.  Neurological: Alert and oriented to person, place, and time. Exhibits normal muscle tone. Gait normal. Coordination normal.  Skin: Skin is warm and dry. No rash noted. Not diaphoretic. No erythema. No pallor.  Psychiatric: Mood, memory and judgment normal.  Vitals reviewed.  LABORATORY DATA: Lab Results  Component Value Date   WBC 6.2 10/02/2018   HGB 13.2 10/02/2018   HCT 38.3 (L) 10/02/2018   MCV 96.7 10/02/2018   PLT 142 (L) 10/02/2018      Chemistry      Component Value Date/Time   NA 141  10/02/2018 1243   NA 136 04/04/2017 1141   K 3.6 10/02/2018 1243   K 4.4 04/04/2017 1141   CL 115 (H) 10/02/2018 1243   CO2 20 (L) 10/02/2018 1243   CO2 24 04/04/2017 1141   BUN 12 10/02/2018 1243   BUN 21.1 04/04/2017 1141   CREATININE 0.70 10/02/2018 1243   CREATININE 1.1 04/04/2017 1141      Component Value Date/Time   CALCIUM 6.9 (L) 10/02/2018 1243   CALCIUM 9.1 04/04/2017 1141   ALKPHOS 72 10/02/2018 1243   ALKPHOS 89 04/04/2017 1141   AST 19 10/02/2018 1243   AST 17 04/04/2017 1141   ALT 18 10/02/2018 1243   ALT 18 04/04/2017 1141   BILITOT 0.7 10/02/2018 1243   BILITOT 0.67 04/04/2017 1141       RADIOGRAPHIC STUDIES:  Ct Chest W Contrast  Result Date: 09/18/2018 CLINICAL DATA:  Patient with history of lung cancer. Follow-up exam. EXAM: CT CHEST, ABDOMEN, AND PELVIS WITH CONTRAST TECHNIQUE: Multidetector CT imaging of the chest, abdomen and pelvis was performed following the standard protocol during bolus administration of intravenous contrast. CONTRAST:  119m OMNIPAQUE IOHEXOL 300 MG/ML  SOLN COMPARISON:  CT CAP 07/01/2018 FINDINGS: CT CHEST FINDINGS Cardiovascular: Right anterior chest wall Port-A-Cath is present with tip terminating in the superior vena cava. Stable dilated main pulmonary artery. Small pericardial effusion. Mediastinum/Nodes: Stable to minimal interval increase in size of mediastinal adenopathy when compared to prior exam. Right paratracheal lymph node measures 1.3 cm (image 15; series 2), previously 1.2 cm. Right paratracheal lymph node measures 2.0 cm (image 22; series 2), previously 1.9 cm. Subcarinal lymph node measures 1.5 cm (image 32; series 2), previously 1.5 cm. No axillary lymphadenopathy. Lungs/Pleura: Central airways are patent. Centrilobular and paraseptal emphysematous changes. Similar-appearing bilateral peribronchovascular ground-glass opacity in interstitial thickening. Sharply marginated perihilar consolidation within the left mid lung  compatible with radiation fibrosis is similar when compared to prior exam. No new consolidative opacities. Musculoskeletal: Thoracic spine degenerative changes. No aggressive or acute appearing osseous lesions. CT ABDOMEN PELVIS FINDINGS Hepatobiliary: Stable subcentimeter low-attenuation lesion left hepatic lobe. No new hepatic lesions are identified. Multiple gallstones. No gallbladder wall thickening or pericholecystic fluid. Pancreas: Unremarkable Spleen: Unremarkable Adrenals/Urinary Tract: Stable 1.1 cm right adrenal nodule (image 56; series 2), compatible with benign nodule. No new or enlarging adrenal nodules. Kidneys enhance symmetrically with contrast. Stable left renal cyst. Urinary bladder is unremarkable. Stomach/Bowel: No abnormal bowel wall thickening or evidence for bowel obstruction. No free fluid or free intraperitoneal air. Normal appendix. Normal morphology of the stomach. Vascular/Lymphatic: Normal caliber abdominal aorta. Peripheral calcified atherosclerotic plaque. No retroperitoneal lymphadenopathy. Reproductive: Heterogeneous prostate. Other: Small fat containing inguinal hernias bilaterally. Musculoskeletal: Postsurgical changes proximal left femur. Lumbar spine degenerative changes. No aggressive or acute appearing osseous lesions. IMPRESSION: 1. Stable left perihilar radiation fibrosis. No evidence for localized recurrence or metastatic disease. 2. Similar-appearing nonspecific peribronchovascular ground-glass  opacity and interstitial thickening. Recommend continued attention on follow-up. 3. Stable to minimal interval increase in size of mediastinal adenopathy. 4. Aortic Atherosclerosis (ICD10-I70.0) and Emphysema (ICD10-J43.9). Electronically Signed   By: Lovey Newcomer M.D.   On: 09/18/2018 12:51   Ct Abdomen Pelvis W Contrast  Result Date: 09/18/2018 CLINICAL DATA:  Patient with history of lung cancer. Follow-up exam. EXAM: CT CHEST, ABDOMEN, AND PELVIS WITH CONTRAST TECHNIQUE:  Multidetector CT imaging of the chest, abdomen and pelvis was performed following the standard protocol during bolus administration of intravenous contrast. CONTRAST:  114m OMNIPAQUE IOHEXOL 300 MG/ML  SOLN COMPARISON:  CT CAP 07/01/2018 FINDINGS: CT CHEST FINDINGS Cardiovascular: Right anterior chest wall Port-A-Cath is present with tip terminating in the superior vena cava. Stable dilated main pulmonary artery. Small pericardial effusion. Mediastinum/Nodes: Stable to minimal interval increase in size of mediastinal adenopathy when compared to prior exam. Right paratracheal lymph node measures 1.3 cm (image 15; series 2), previously 1.2 cm. Right paratracheal lymph node measures 2.0 cm (image 22; series 2), previously 1.9 cm. Subcarinal lymph node measures 1.5 cm (image 32; series 2), previously 1.5 cm. No axillary lymphadenopathy. Lungs/Pleura: Central airways are patent. Centrilobular and paraseptal emphysematous changes. Similar-appearing bilateral peribronchovascular ground-glass opacity in interstitial thickening. Sharply marginated perihilar consolidation within the left mid lung compatible with radiation fibrosis is similar when compared to prior exam. No new consolidative opacities. Musculoskeletal: Thoracic spine degenerative changes. No aggressive or acute appearing osseous lesions. CT ABDOMEN PELVIS FINDINGS Hepatobiliary: Stable subcentimeter low-attenuation lesion left hepatic lobe. No new hepatic lesions are identified. Multiple gallstones. No gallbladder wall thickening or pericholecystic fluid. Pancreas: Unremarkable Spleen: Unremarkable Adrenals/Urinary Tract: Stable 1.1 cm right adrenal nodule (image 56; series 2), compatible with benign nodule. No new or enlarging adrenal nodules. Kidneys enhance symmetrically with contrast. Stable left renal cyst. Urinary bladder is unremarkable. Stomach/Bowel: No abnormal bowel wall thickening or evidence for bowel obstruction. No free fluid or free  intraperitoneal air. Normal appendix. Normal morphology of the stomach. Vascular/Lymphatic: Normal caliber abdominal aorta. Peripheral calcified atherosclerotic plaque. No retroperitoneal lymphadenopathy. Reproductive: Heterogeneous prostate. Other: Small fat containing inguinal hernias bilaterally. Musculoskeletal: Postsurgical changes proximal left femur. Lumbar spine degenerative changes. No aggressive or acute appearing osseous lesions. IMPRESSION: 1. Stable left perihilar radiation fibrosis. No evidence for localized recurrence or metastatic disease. 2. Similar-appearing nonspecific peribronchovascular ground-glass opacity and interstitial thickening. Recommend continued attention on follow-up. 3. Stable to minimal interval increase in size of mediastinal adenopathy. 4. Aortic Atherosclerosis (ICD10-I70.0) and Emphysema (ICD10-J43.9). Electronically Signed   By: DLovey NewcomerM.D.   On: 09/18/2018 12:51     ASSESSMENT/PLAN:  This is a very pleasant 68year old Caucasian Huber with metastatic non-small cell lung cancer, adenocarcinoma which was initially diagnosed as a stage IIIa.  He presented with a left lower lobe lung mass in addition to mediastinal lymphadenopathy. He was diagnosed in January of 2016.  He is status post a left lower lobe lobectomy with lymph node dissection followed by concurrent chemoradiation.  His PDL 1 expression was 5%  He was diagnosed with metastatic disease involving the left femur as well as a left supraclavicular nodal metastasis and right paratracheal lymphadenopathy in October of 2018.  He was then started on systemic chemotherapy with carboplatin, Alimta, and Keytruda. He is status post 27 cycles. Carboplatin was continued after cycle #2 due to a hypersensitivity reaction. Alimta was discontinued after cycle #4 due to intolerance.   The patient recently had a restaging CT scan.  Dr. MJulien Nordmannpersonally and independently  reviewed the scan and discussed the results with the  patient today.  The scan did not show any evidence of disease progression.  Dr. Julien Nordmann recommends the patient proceed with cycle #28 today as scheduled.  We will see the patient back for a follow-up visit in 3 weeks for evaluation before starting cycle #29.  The patient will continue to use hydrocortisone cream for his itching.   The patient was advised to call immediately if he has any concerning symptoms in the interval. The patient voices understanding of current disease status and treatment options and is in agreement with the current care plan. All questions were answered. The patient knows to call the clinic with any problems, questions or concerns. We can certainly see the patient much sooner if necessary   Orders Placed This Encounter  Procedures  . CMP ([facility] only)    Standing Status:   Standing    Number of Occurrences:   10    Standing Expiration Date:   10/02/2019  . CBC with Differential (Cancer Center Only)    Standing Status:   Standing    Number of Occurrences:   10    Standing Expiration Date:   10/02/2019  . TSH    Standing Status:   Standing    Number of Occurrences:   10    Standing Expiration Date:   10/02/2019     Tobe Sos Mara Favero, PA-C 10/02/18  ADDENDUM: Hematology/Oncology Attending: I had a face-to-face encounter with the patient.  I recommended his care plan.  This is a very pleasant 68 years old white Huber with metastatic non-small cell lung cancer, adenocarcinoma with no currently actionable mutations.  The patient status post induction systemic chemotherapy with carboplatin, Alimta and Keytruda and he is currently on maintenance single agent Keytruda status post 27 cycles.  He has been tolerating his treatment well with no concerning adverse effects. He had repeat CT scan of the chest, abdomen pelvis performed recently.  I personally and independently reviewed the scan images and discussed the results with the patient today. His a scan  showed no concerning findings for disease progression. I recommended for the patient to continue his current treatment with [facility] and he will proceed with cycle #28 today. I will see the patient back for follow-up visit in 3 weeks for evaluation before the next cycle of his treatment. For the hypothyroidism he will continue with his current dose of levothyroxine. He was advised to call immediately if he has any concerning symptoms in the interval.  Disclaimer: This note was dictated with voice recognition software. Similar sounding words can inadvertently be transcribed and may be missed upon review. Eilleen Kempf, MD 10/04/18

## 2018-10-02 NOTE — Progress Notes (Signed)
Orders entered

## 2018-10-02 NOTE — Patient Instructions (Signed)
[facility] Discharge Instructions for Patients Receiving Chemotherapy  Today you received the following chemotherapy agents:  Keytruda.  To help prevent nausea and vomiting after your treatment, we encourage you to take your nausea medication as directed.   If you develop nausea and vomiting that is not controlled by your nausea medication, call the clinic.   BELOW ARE SYMPTOMS THAT SHOULD BE REPORTED IMMEDIATELY:  *FEVER GREATER THAN 100.5 F  *CHILLS WITH OR WITHOUT FEVER  NAUSEA AND VOMITING THAT IS NOT CONTROLLED WITH YOUR NAUSEA MEDICATION  *UNUSUAL SHORTNESS OF BREATH  *UNUSUAL BRUISING OR BLEEDING  TENDERNESS IN MOUTH AND THROAT WITH OR WITHOUT PRESENCE OF ULCERS  *URINARY PROBLEMS  *BOWEL PROBLEMS  UNUSUAL RASH Items with * indicate a potential emergency and should be followed up as soon as possible.  Feel free to call the clinic should you have any questions or concerns. The clinic phone number is [phone].  Please show the CHEMO ALERT CARD at check-in to the Emergency Department and triage nurse.    

## 2018-10-06 ENCOUNTER — Encounter: Payer: Self-pay | Admitting: Internal Medicine

## 2018-10-06 ENCOUNTER — Telehealth: Payer: Self-pay | Admitting: Physician Assistant

## 2018-10-06 NOTE — Telephone Encounter (Signed)
Added additional treatments per 7/02 los - pt to get an updated schedule next visit.

## 2018-10-23 ENCOUNTER — Inpatient Hospital Stay: Payer: Medicare HMO

## 2018-10-23 ENCOUNTER — Inpatient Hospital Stay (HOSPITAL_BASED_OUTPATIENT_CLINIC_OR_DEPARTMENT_OTHER): Payer: Medicare HMO | Admitting: Internal Medicine

## 2018-10-23 ENCOUNTER — Other Ambulatory Visit: Payer: Self-pay

## 2018-10-23 ENCOUNTER — Encounter: Payer: Self-pay | Admitting: Internal Medicine

## 2018-10-23 VITALS — BP 122/76 | HR 69 | Temp 98.7°F | Resp 18 | Ht 73.0 in | Wt 254.0 lb

## 2018-10-23 DIAGNOSIS — C3492 Malignant neoplasm of unspecified part of left bronchus or lung: Secondary | ICD-10-CM

## 2018-10-23 DIAGNOSIS — Z5112 Encounter for antineoplastic immunotherapy: Secondary | ICD-10-CM

## 2018-10-23 DIAGNOSIS — E032 Hypothyroidism due to medicaments and other exogenous substances: Secondary | ICD-10-CM

## 2018-10-23 DIAGNOSIS — E039 Hypothyroidism, unspecified: Secondary | ICD-10-CM

## 2018-10-23 DIAGNOSIS — Z95828 Presence of other vascular implants and grafts: Secondary | ICD-10-CM

## 2018-10-23 DIAGNOSIS — C7951 Secondary malignant neoplasm of bone: Secondary | ICD-10-CM

## 2018-10-23 LAB — CBC WITH DIFFERENTIAL (CANCER CENTER ONLY)
Abs Immature Granulocytes: 0.02 10*3/uL (ref 0.00–0.07)
Basophils Absolute: 0 10*3/uL (ref 0.0–0.1)
Basophils Relative: 0 %
Eosinophils Absolute: 0.1 10*3/uL (ref 0.0–0.5)
Eosinophils Relative: 2 %
HCT: 45.5 % (ref 39.0–52.0)
Hemoglobin: 16.1 g/dL (ref 13.0–17.0)
Immature Granulocytes: 0 %
Lymphocytes Relative: 18 %
Lymphs Abs: 1.4 10*3/uL (ref 0.7–4.0)
MCH: 33.9 pg (ref 26.0–34.0)
MCHC: 35.4 g/dL (ref 30.0–36.0)
MCV: 95.8 fL (ref 80.0–100.0)
Monocytes Absolute: 0.5 10*3/uL (ref 0.1–1.0)
Monocytes Relative: 7 %
Neutro Abs: 5.4 10*3/uL (ref 1.7–7.7)
Neutrophils Relative %: 73 %
Platelet Count: 155 10*3/uL (ref 150–400)
RBC: 4.75 MIL/uL (ref 4.22–5.81)
RDW: 13.6 % (ref 11.5–15.5)
WBC Count: 7.4 10*3/uL (ref 4.0–10.5)
nRBC: 0 % (ref 0.0–0.2)

## 2018-10-23 LAB — CMP (CANCER CENTER ONLY)
ALT: 23 U/L (ref 0–44)
AST: 21 U/L (ref 15–41)
Albumin: 3.9 g/dL (ref 3.5–5.0)
Alkaline Phosphatase: 94 U/L (ref 38–126)
Anion gap: 8 (ref 5–15)
BUN: 15 mg/dL (ref 8–23)
CO2: 26 mmol/L (ref 22–32)
Calcium: 9.2 mg/dL (ref 8.9–10.3)
Chloride: 105 mmol/L (ref 98–111)
Creatinine: 0.94 mg/dL (ref 0.61–1.24)
GFR, Est AFR Am: >60 mL/min (ref >60)
GFR, Estimated: >60 mL/min (ref >60)
Glucose, Bld: 156 mg/dL — ABNORMAL HIGH (ref 70–99)
Potassium: 4.4 mmol/L (ref 3.5–5.1)
Sodium: 139 mmol/L (ref 135–145)
Total Bilirubin: 1.2 mg/dL (ref 0.3–1.2)
Total Protein: 6.4 g/dL — ABNORMAL LOW (ref 6.5–8.1)

## 2018-10-23 LAB — TSH: TSH: 4.585 u[IU]/mL — ABNORMAL HIGH (ref 0.320–4.118)

## 2018-10-23 MED ORDER — HEPARIN SOD (PORK) LOCK FLUSH 100 UNIT/ML IV SOLN
500.0000 [IU] | Freq: Once | INTRAVENOUS | Status: AC | PRN
  Administered 2018-10-23: 500 [IU]
  Filled 2018-10-23: qty 5

## 2018-10-23 MED ORDER — SODIUM CHLORIDE 0.9% FLUSH
10.0000 mL | INTRAVENOUS | Status: DC | PRN
  Administered 2018-10-23: 10 mL
  Filled 2018-10-23: qty 10

## 2018-10-23 MED ORDER — SODIUM CHLORIDE 0.9 % IV SOLN
200.0000 mg | Freq: Once | INTRAVENOUS | Status: AC
  Administered 2018-10-23: 13:00:00 200 mg via INTRAVENOUS
  Filled 2018-10-23: qty 8

## 2018-10-23 MED ORDER — SODIUM CHLORIDE 0.9 % IV SOLN
Freq: Once | INTRAVENOUS | Status: AC
  Administered 2018-10-23: 12:00:00 via INTRAVENOUS
  Filled 2018-10-23: qty 250

## 2018-10-23 NOTE — Patient Instructions (Signed)
[facility] Discharge Instructions for Patients Receiving Chemotherapy  Today you received the following chemotherapy agents:  Keytruda.  To help prevent nausea and vomiting after your treatment, we encourage you to take your nausea medication as directed.   If you develop nausea and vomiting that is not controlled by your nausea medication, call the clinic.   BELOW ARE SYMPTOMS THAT SHOULD BE REPORTED IMMEDIATELY:  *FEVER GREATER THAN 100.5 F  *CHILLS WITH OR WITHOUT FEVER  NAUSEA AND VOMITING THAT IS NOT CONTROLLED WITH YOUR NAUSEA MEDICATION  *UNUSUAL SHORTNESS OF BREATH  *UNUSUAL BRUISING OR BLEEDING  TENDERNESS IN MOUTH AND THROAT WITH OR WITHOUT PRESENCE OF ULCERS  *URINARY PROBLEMS  *BOWEL PROBLEMS  UNUSUAL RASH Items with * indicate a potential emergency and should be followed up as soon as possible.  Feel free to call the clinic should you have any questions or concerns. The clinic phone number is [phone].  Please show the CHEMO ALERT CARD at check-in to the Emergency Department and triage nurse.    

## 2018-10-23 NOTE — Progress Notes (Signed)
[facility] [phone]   [phone]  OFFICE PROGRESS NOTE  [name], MD [address] [place]  [place] [address]  DIAGNOSIS: Metastatic non-small cell lung cancer initially diagnosed as stage IIIA (T2a, N2, M0) non-small cell lung cancer, poorly differentiated adenocarcinoma presented with left lower lobe lung mass in addition to mediastinal lymphadenopathy.  The patient was diagnosed with metastatic disease involving the left femur as well as left supraclavicular nodal metastases and right paratracheal lymphadenopathy in October 2018.  Biomarker Findings Microsatellite Status - MS-Stable Tumor Mutational Burden - TMB-Low (3 Muts/Mb) Genomic Findings For a complete list of the genes assayed, please refer to the Appendix. STK11 P261f*6 [id number] site 1626+9S>WDAXX E374* MLL2 V45335f40 NBN K23364f NOTCH2 [id number] splice site [id number]+[id number]>FDisease relevant genes with no reportable alterations: EGFR, KRAS, ALK, BRAF, MET, RET, ERBB2, ROS1   PDL1 expression 5%  PRIOR THERAPY: 1) status post wedge resection of the left lower lobe lung mass as well as AP window lymph node dissection but there was residual metastatic mediastinal lymphadenopathy that could not be resected. 2) a course of concurrent chemoradiation with weekly carboplatin [place].  3) status post palliative radiotherapy to the left femur metastatic bone disease. 4)  Systemic chemotherapy with carboplatin for AUC of 5, Alimta 500 mg/M2 and Keytruda 200 mg IV every 3 weeks.  First dose March 07, 2017.  Carboplatin was discontinued during cycle #2 secondary to hypersensitivity reaction. 5) status post 2 cycles of maintenance treatment with Alimta and Ketruda (pembrolizumab).  Alimta was discontinued secondary to intolerance.  CURRENT THERAPY: Maintenance treatment with single agent Ketruda (pembrolizumab) status post 22   cycles.  INTERVAL HISTORY: Victor Huber 68o. male returns to the clinic today for follow-up visit.  The patient is feeling fine today with no concerning complaints except for mild fatigue.  He denied having any chest pain, shortness of breath, cough or hemoptysis.  He denied having any fever or chills.  He has no nausea, vomiting, diarrhea or constipation.  He denied having any weight loss or night sweats.  He continues to tolerate this treatment well with no concerning complaints.  He is here for evaluation before starting cycle #23 of his maintenance therapy.   MEDICAL HISTORY: Past Medical History:  Diagnosis Date  . Adenocarcinoma of left lung, stage 3 (HCCArial/16/2018  . Arthritis   . Atrial fibrillation ([place]CC[place]/16/2018  . Atrial fibrillation ([place]CC[place] . COPD (chronic obstructive pulmonary disease) ([place]CC[place]/16/2018  . History of chemotherapy   . History of radiation therapy   . Hyperlipidemia   . Hypothyroid 08/15/2016  . Longstanding persistent atrial fibrillation 08/29/2016  . Pathologic fracture    left femur  . Pneumonitis   . S/P TURP 08/15/2016  . Wears glasses   . Wears hearing aid in both ears     ALLERGIES:  is allergic to carboplatin and flecainide.  MEDICATIONS:  Current Outpatient Medications  Medication Sig Dispense Refill  . apixaban (ELIQUIS) 5 MG TABS tablet Take 1 tablet (5 mg total) by mouth 2 (two) times daily. 180 tablet 3  . atenolol (TENORMIN) 50 MG tablet Take 1 tablet (50 mg total) by mouth daily. 90 tablet 3  . atorvastatin (LIPITOR) 40 MG tablet Take 40 mg by mouth daily.    . citalopram (CELEXA) 40 MG tablet Take 40 mg by mouth daily.    . iMarland Kitchenuprofen (ADVIL,MOTRIN) 400 MG tablet Take  400 mg by mouth every 4 (four) hours as needed for moderate pain.    Marland Kitchen levothyroxine (SYNTHROID, LEVOTHROID) 150 MCG tablet Take 150 mcg by mouth daily.    . tamsulosin (FLOMAX) 0.4 MG CAPS capsule Take 0.4 mg daily by mouth.      No current facility-administered  medications for this visit.     SURGICAL HISTORY:  Past Surgical History:  Procedure Laterality Date  . BRONCHOSCOPY  10/2014  . CARDIAC CATHETERIZATION     05/07/12  . CARDIOVERSION     x2  . COLONOSCOPY    . DG BIOPSY LUNG Left 10/2014   FNA - Adenocarcinoma   . FEMUR IM NAIL Left 02/19/2017  . FEMUR IM NAIL Left 02/19/2017   Procedure: INTRAMEDULLARY (IM) NAIL FEMORAL;  Surgeon: Marchia Bond, MD;  Location: [facility];  Service: Orthopedics;  Laterality: Left;  . IR FLUORO GUIDE PORT INSERTION RIGHT  07/03/2017  . IR US GUIDE VASC ACCESS RIGHT  07/03/2017  . LUNG CANCER SURGERY Left 12/2014   Wedge Resection   . MULTIPLE TOOTH EXTRACTIONS    . Status post TURP    . TONSILLECTOMY      REVIEW OF SYSTEMS:  A comprehensive review of systems was negative except for: Constitutional: positive for fatigue   PHYSICAL EXAMINATION: General appearance: alert, cooperative, fatigued and no distress Head: Normocephalic, without obvious abnormality, atraumatic Neck: no adenopathy, no JVD, supple, symmetrical, trachea midline and thyroid not enlarged, symmetric, no tenderness/mass/nodules Lymph nodes: Cervical, supraclavicular, and axillary nodes normal. Resp: clear to auscultation bilaterally Back: symmetric, no curvature. ROM normal. No CVA tenderness. Cardio: regular rate and rhythm, S1, S2 normal, no murmur, click, rub or gallop GI: soft, non-tender; bowel sounds normal; no masses,  no organomegaly Extremities: extremities normal, atraumatic, no cyanosis or edema  ECOG PERFORMANCE STATUS: 1 - Symptomatic but completely ambulatory  Blood pressure 122/76, pulse 69, temperature 98.7 F (37.1 C), temperature source Temporal, resp. rate 18, height _0  (1.854 m), weight 254 lb (115.2 kg), SpO2 100 %.  LABORATORY DATA: Lab Results  Component Value Date   WBC 7.4 10/23/2018   HGB 16.1 10/23/2018   HCT 45.5 10/23/2018   MCV 95.8 10/23/2018   PLT 155 10/23/2018      Chemistry       Component Value Date/Time   NA 141 10/02/2018 1243   NA 136 04/04/2017 1141   K 3.6 10/02/2018 1243   K 4.4 04/04/2017 1141   CL 115 (H) 10/02/2018 1243   CO2 20 (L) 10/02/2018 1243   CO2 24 04/04/2017 1141   BUN 12 10/02/2018 1243   BUN 21.1 04/04/2017 1141   CREATININE 0.70 10/02/2018 1243   CREATININE 1.1 04/04/2017 1141      Component Value Date/Time   CALCIUM 6.9 (L) 10/02/2018 1243   CALCIUM 9.1 04/04/2017 1141   ALKPHOS 72 10/02/2018 1243   ALKPHOS 89 04/04/2017 1141   AST 19 10/02/2018 1243   AST 17 04/04/2017 1141   ALT 18 10/02/2018 1243   ALT 18 04/04/2017 1141   BILITOT 0.7 10/02/2018 1243   BILITOT 0.67 04/04/2017 1141       RADIOGRAPHIC STUDIES: No results found.  ASSESSMENT AND PLAN: This is a 68 years old white male with metastatic non-small cell lung cancer, adenocarcinoma with no actionable mutations and PDL 1 expression of 5% that was initially diagnosed as stage IIIa non-small cell lung cancer, adenocarcinoma status post left lower lobectomy with lymph node dissection followed by a course of  concurrent chemoradiation completed in January 2016.  The patient had evidence for disease metastasis in October 2018 with metastatic disease to the left femur as well as left supraclavicular and right paratracheal lymph nodes. The patient is currently on systemic chemotherapy initially was with carboplatin, Alimta and Keytruda.  Carboplatin was discontinued secondary to hypersensitivity reaction starting from cycle #2.   He was also treated with 3 cycles of maintenance Alimta and Ketruda (pembrolizumab) but Alimta was discontinued secondary to intolerance. He is currently undergoing treatment with maintenance Keytruda as a single agent status post 22 cycles.  He has been tolerating this treatment well with no concerning adverse effects. I recommended for him to proceed with cycle #23 today as planned. I will see him back for follow-up visit in 3 weeks for evaluation  before starting cycle #24. The patient was advised to call immediately if he has any concerning symptoms in the interval. The patient voices understanding of current disease status and treatment options and is in agreement with the current care plan. All questions were answered. The patient knows to call the clinic with any problems, questions or concerns. We can certainly see the patient much sooner if necessary.  Disclaimer: This note was dictated with voice recognition software. Similar sounding words can inadvertently be transcribed and may not be corrected upon review.

## 2018-11-13 ENCOUNTER — Inpatient Hospital Stay: Payer: Medicare HMO | Attending: Internal Medicine

## 2018-11-13 ENCOUNTER — Telehealth: Payer: Self-pay | Admitting: Physician Assistant

## 2018-11-13 ENCOUNTER — Inpatient Hospital Stay: Payer: Medicare HMO

## 2018-11-13 ENCOUNTER — Other Ambulatory Visit: Payer: Self-pay

## 2018-11-13 ENCOUNTER — Inpatient Hospital Stay (HOSPITAL_BASED_OUTPATIENT_CLINIC_OR_DEPARTMENT_OTHER): Payer: Medicare HMO | Admitting: Physician Assistant

## 2018-11-13 ENCOUNTER — Encounter: Payer: Self-pay | Admitting: Physician Assistant

## 2018-11-13 VITALS — BP 115/70 | HR 71 | Temp 98.3°F | Resp 18 | Ht 73.0 in | Wt 256.3 lb

## 2018-11-13 DIAGNOSIS — Z95828 Presence of other vascular implants and grafts: Secondary | ICD-10-CM

## 2018-11-13 DIAGNOSIS — Z9221 Personal history of antineoplastic chemotherapy: Secondary | ICD-10-CM | POA: Insufficient documentation

## 2018-11-13 DIAGNOSIS — C3492 Malignant neoplasm of unspecified part of left bronchus or lung: Secondary | ICD-10-CM

## 2018-11-13 DIAGNOSIS — C7951 Secondary malignant neoplasm of bone: Secondary | ICD-10-CM | POA: Diagnosis not present

## 2018-11-13 DIAGNOSIS — Z5112 Encounter for antineoplastic immunotherapy: Secondary | ICD-10-CM | POA: Insufficient documentation

## 2018-11-13 DIAGNOSIS — Z79899 Other long term (current) drug therapy: Secondary | ICD-10-CM | POA: Diagnosis not present

## 2018-11-13 DIAGNOSIS — Z902 Acquired absence of lung [part of]: Secondary | ICD-10-CM | POA: Diagnosis not present

## 2018-11-13 DIAGNOSIS — C3432 Malignant neoplasm of lower lobe, left bronchus or lung: Secondary | ICD-10-CM | POA: Insufficient documentation

## 2018-11-13 DIAGNOSIS — Z923 Personal history of irradiation: Secondary | ICD-10-CM | POA: Diagnosis not present

## 2018-11-13 LAB — CMP (CANCER CENTER ONLY)
ALT: 26 U/L (ref 0–44)
AST: 22 U/L (ref 15–41)
Albumin: 4.1 g/dL (ref 3.5–5.0)
Alkaline Phosphatase: 92 U/L (ref 38–126)
Anion gap: 8 (ref 5–15)
BUN: 17 mg/dL (ref 8–23)
CO2: 25 mmol/L (ref 22–32)
Calcium: 9.3 mg/dL (ref 8.9–10.3)
Chloride: 106 mmol/L (ref 98–111)
Creatinine: 0.99 mg/dL (ref 0.61–1.24)
GFR, Est AFR Am: >60 mL/min (ref >60)
GFR, Estimated: >60 mL/min (ref >60)
Glucose, Bld: 106 mg/dL — ABNORMAL HIGH (ref 70–99)
Potassium: 4.5 mmol/L (ref 3.5–5.1)
Sodium: 139 mmol/L (ref 135–145)
Total Bilirubin: 1.1 mg/dL (ref 0.3–1.2)
Total Protein: 6.6 g/dL (ref 6.5–8.1)

## 2018-11-13 LAB — CBC WITH DIFFERENTIAL (CANCER CENTER ONLY)
Abs Immature Granulocytes: 0.02 10*3/uL (ref 0.00–0.07)
Basophils Absolute: 0 10*3/uL (ref 0.0–0.1)
Basophils Relative: 0 %
Eosinophils Absolute: 0.1 10*3/uL (ref 0.0–0.5)
Eosinophils Relative: 1 %
HCT: 45.3 % (ref 39.0–52.0)
Hemoglobin: 15.9 g/dL (ref 13.0–17.0)
Immature Granulocytes: 0 %
Lymphocytes Relative: 24 %
Lymphs Abs: 1.7 10*3/uL (ref 0.7–4.0)
MCH: 33.6 pg (ref 26.0–34.0)
MCHC: 35.1 g/dL (ref 30.0–36.0)
MCV: 95.8 fL (ref 80.0–100.0)
Monocytes Absolute: 0.6 10*3/uL (ref 0.1–1.0)
Monocytes Relative: 8 %
Neutro Abs: 4.8 10*3/uL (ref 1.7–7.7)
Neutrophils Relative %: 67 %
Platelet Count: 166 10*3/uL (ref 150–400)
RBC: 4.73 MIL/uL (ref 4.22–5.81)
RDW: 13.8 % (ref 11.5–15.5)
WBC Count: 7.2 10*3/uL (ref 4.0–10.5)
nRBC: 0 % (ref 0.0–0.2)

## 2018-11-13 LAB — TSH: TSH: 4.798 u[IU]/mL — ABNORMAL HIGH (ref 0.320–4.118)

## 2018-11-13 MED ORDER — SODIUM CHLORIDE 0.9% FLUSH
10.0000 mL | INTRAVENOUS | Status: DC | PRN
  Administered 2018-11-13: 10 mL
  Filled 2018-11-13: qty 10

## 2018-11-13 MED ORDER — SODIUM CHLORIDE 0.9 % IV SOLN
200.0000 mg | Freq: Once | INTRAVENOUS | Status: AC
  Administered 2018-11-13: 15:00:00 200 mg via INTRAVENOUS
  Filled 2018-11-13: qty 8

## 2018-11-13 MED ORDER — HEPARIN SOD (PORK) LOCK FLUSH 100 UNIT/ML IV SOLN
500.0000 [IU] | Freq: Once | INTRAVENOUS | Status: AC | PRN
  Administered 2018-11-13: 500 [IU]
  Filled 2018-11-13: qty 5

## 2018-11-13 MED ORDER — SODIUM CHLORIDE 0.9 % IV SOLN
Freq: Once | INTRAVENOUS | Status: AC
  Administered 2018-11-13: 15:00:00 via INTRAVENOUS
  Filled 2018-11-13: qty 250

## 2018-11-13 MED ORDER — SODIUM CHLORIDE 0.9% FLUSH
10.0000 mL | INTRAVENOUS | Status: DC | PRN
  Administered 2018-11-13: 16:00:00 10 mL
  Filled 2018-11-13: qty 10

## 2018-11-13 NOTE — Progress Notes (Signed)
[facility] OFFICE PROGRESS NOTE  [name], MD [address], [place] [address]  DIAGNOSIS: Metastatic non-small cell lung cancer initially diagnosed as stage IIIA (T2a, N2, M0) non-small cell lung cancer, poorly differentiated adenocarcinoma presented with left lower lobe lung mass in addition to mediastinal lymphadenopathy. The patient was diagnosed with metastatic disease involving the left femur as well as left supraclavicular nodal metastases and right paratracheal lymphadenopathy in October 2018.  Biomarker Findings Microsatellite Status - MS-Stable Tumor Mutational Burden - TMB-Low (3 Muts/Mb) Genomic Findings For a complete list of the genes assayed, please refer to the Appendix. STK11 P242f*6 [id number] site 1195+0D>TDAXX E374* MLL2 V45388f40 NBN K23331f NOTCH2 [id number] splice site [id number]+9X>IDisease relevant genes with no reportable alterations: EGFR, KRAS, ALK, BRAF, MET, RET, ERBB2, ROS1   PDL1 expression5%  PRIOR THERAPY: 1) status post wedge resection of the left lower lobe lung mass as well as AP window lymph node dissection but there was residual metastatic mediastinal lymphadenopathy that could not be resected. 2) a course of concurrent chemoradiation with weekly carboplatin and paclitaxel [place].  3) status post palliative radiotherapy to the left femur metastatic bone disease. 4) Systemic chemotherapy with carboplatin for AUC of 5, Alimta 500 mg/M2 and Keytruda 200 mg IV every 3 weeks. First dose March 07, 2017. Carboplatin was discontinued during cycle #2 secondary to hypersensitivity reaction. 5) status post 2 cycles of maintenance treatment with Alimta and Ketruda (pembrolizumab). Alimta was discontinued secondary to intolerance.  CURRENT THERAPY: Maintenance treatment with single agent Ketruda (pembrolizumab) status post 24 cycles.  INTERVAL HISTORY: MicJAKEB LAMPING 68o.  male returns to clinic for follow-up visit. The patient is [place] a [place] to the mountains at the end of [place] with his wife and mentioned adjusting his appointment to accommodate him for his trip. The patient is feeling fairly well today without any concerning complaints except for his baseline shortness of breath with exertion and a dry cough.  He also notes that he is having diminished hearing in his right ear for last 3 weeks or so. He believes that this may be attributed to earwax secondary to him pushing wax in his ear from sticking things in his ear such as his hearing aids.  Denies any recent fevers, sore throat, nasal congestion, tinnitus, drainage from the ear, popping sensations, or otalgia.   The patient has been tolerating his treatment with maintenance single agent Keytruda fairly well without any adverse side effects except for some mild rashes on his cheeks/eyebrows and fatigue. He uses hydrocortisone cream for the itching with some relief.  Denies any fever, chills, night sweats, or weight loss.  He denies any chest pain or hemoptysis.  He denies any nausea, vomiting, diarrhea, or constipation.  He denies any headache or vision changes.  He is here today for evaluation before starting cycle 25 of single agent Keytruda.  MEDICAL HISTORY: Past Medical History:  Diagnosis Date  . Adenocarcinoma of left lung, stage 3 (HCCKaltag/16/2018  . Arthritis   . Atrial fibrillation (HCCLincoln [place]/16/2018  . Atrial fibrillation (HCCHepzib[place] . COPD (chronic obstructive pulmonary disease) (HCCSil[place]/16/2018  . History of chemotherapy   . History of radiation therapy   . Hyperlipidemia   . Hypothyroid 08/15/2016  . Longstanding persistent atrial fibrillation 08/29/2016  . Pathologic fracture    left femur  . Pneumonitis   . S/P TURP 08/15/2016  . Wears glasses   . Wears hearing  aid in both ears     ALLERGIES:  is allergic to carboplatin and flecainide.  MEDICATIONS:  Current Outpatient Medications   Medication Sig Dispense Refill  . apixaban (ELIQUIS) 5 MG TABS tablet Take 1 tablet (5 mg total) by mouth 2 (two) times daily. 180 tablet 3  . atenolol (TENORMIN) 50 MG tablet Take 1 tablet (50 mg total) by mouth daily. 90 tablet 3  . atorvastatin (LIPITOR) 40 MG tablet Take 40 mg by mouth daily.    . citalopram (CELEXA) 40 MG tablet Take 40 mg by mouth daily.    Marland Kitchen ibuprofen (ADVIL,MOTRIN) 400 MG tablet Take 400 mg by mouth every 4 (four) hours as needed for moderate pain.    Marland Kitchen levothyroxine (SYNTHROID, LEVOTHROID) 150 MCG tablet Take 150 mcg by mouth daily.    . tamsulosin (FLOMAX) 0.4 MG CAPS capsule Take 0.4 mg daily by mouth.      No current facility-administered medications for this visit.    Facility-Administered Medications Ordered in Other Visits  Medication Dose Route Frequency Provider Last Rate Last Dose  . sodium chloride flush (NS) 0.9 % injection 10 mL  10 mL Intracatheter PRN Curt Bears, MD   10 mL at 11/13/18 1556    SURGICAL HISTORY:  Past Surgical History:  Procedure Laterality Date  . BRONCHOSCOPY  10/2014  . CARDIAC CATHETERIZATION     05/07/12  . CARDIOVERSION     x2  . COLONOSCOPY    . DG BIOPSY LUNG Left 10/2014   FNA - Adenocarcinoma   . FEMUR IM NAIL Left 02/19/2017  . FEMUR IM NAIL Left 02/19/2017   Procedure: INTRAMEDULLARY (IM) NAIL FEMORAL;  Surgeon: Marchia Bond, MD;  Location: [facility];  Service: Orthopedics;  Laterality: Left;  . IR FLUORO GUIDE PORT INSERTION RIGHT  07/03/2017  . IR US GUIDE VASC ACCESS RIGHT  07/03/2017  . LUNG CANCER SURGERY Left 12/2014   Wedge Resection   . MULTIPLE TOOTH EXTRACTIONS    . Status post TURP    . TONSILLECTOMY      REVIEW OF SYSTEMS:   Review of Systems  Constitutional: Positive for fatigue. Negative for appetite change, chills, fever and unexpected weight change.  HENT: Positive for decreased hearing in his right ear. Negative for mouth sores, nosebleeds, sore throat and trouble swallowing.   Eyes:  Negative for eye problems and icterus.  Respiratory:  Positive for dry cough and shortness of breath with exertion. Negative for hemoptysis and wheezing.   Cardiovascular: Negative for chest pain and leg swelling.  Gastrointestinal: Negative for abdominal pain, constipation, diarrhea, nausea and vomiting.  Genitourinary: Negative for bladder incontinence, difficulty urinating, dysuria, frequency and hematuria.   Musculoskeletal: Negative for back pain, gait problem, neck pain and neck stiffness.  Skin: Positive for rash on his eyebrows and cheeks with associated itching. Neurological: Negative for dizziness, extremity weakness, gait problem, headaches, light-headedness and seizures.  Hematological: Negative for adenopathy. Does not bruise/bleed easily.  Psychiatric/Behavioral: Negative for confusion, depression and sleep disturbance. The patient is not nervous/anxious.     PHYSICAL EXAMINATION:  Blood pressure 115/70, pulse 71, temperature 98.3 F (36.8 C), temperature source Oral, resp. rate 18, height _0  (1.854 m), weight 256 lb 4.8 oz (116.3 kg), SpO2 93 %.  ECOG PERFORMANCE STATUS: 1 - Symptomatic but completely ambulatory  Physical Exam  Constitutional: Oriented to person, place, and time and well-developed, well-nourished, and in no distress. HENT:  Head: Normocephalic and atraumatic.  Mouth/Throat: Oropharynx is clear and moist. No oropharyngeal  exudate.  Eyes: Conjunctivae are normal. Right eye exhibits no discharge. Left eye exhibits no discharge. No scleral icterus.  Neck: Normal range of motion. Neck supple.  Cardiovascular: Irregular rhythm. Normal rate normal heart sounds and intact distal pulses.   Pulmonary/Chest: Effort normal and breath sounds normal. No respiratory distress. No wheezes. No rales.  Abdominal: Soft. Bowel sounds are normal. Exhibits no distension and no mass. There is no tenderness.  Musculoskeletal: Normal range of motion. Exhibits no edema.   Lymphadenopathy:    No cervical adenopathy.  Neurological: Alert and oriented to person, place, and time. Exhibits normal muscle tone. Gait normal. Coordination normal.  Skin: Skin is warm and dry. No rash noted. Not diaphoretic. No erythema. No pallor.  Psychiatric: Mood, memory and judgment normal.  Vitals reviewed.  LABORATORY DATA: Lab Results  Component Value Date   WBC 7.2 11/13/2018   HGB 15.9 11/13/2018   HCT 45.3 11/13/2018   MCV 95.8 11/13/2018   PLT 166 11/13/2018      Chemistry      Component Value Date/Time   NA 139 11/13/2018 1310   NA 136 04/04/2017 1141   K 4.5 11/13/2018 1310   K 4.4 04/04/2017 1141   CL 106 11/13/2018 1310   CO2 25 11/13/2018 1310   CO2 24 04/04/2017 1141   BUN 17 11/13/2018 1310   BUN 21.1 04/04/2017 1141   CREATININE 0.99 11/13/2018 1310   CREATININE 1.1 04/04/2017 1141      Component Value Date/Time   CALCIUM 9.3 11/13/2018 1310   CALCIUM 9.1 04/04/2017 1141   ALKPHOS 92 11/13/2018 1310   ALKPHOS 89 04/04/2017 1141   AST 22 11/13/2018 1310   AST 17 04/04/2017 1141   ALT 26 11/13/2018 1310   ALT 18 04/04/2017 1141   BILITOT 1.1 11/13/2018 1310   BILITOT 0.67 04/04/2017 1141       RADIOGRAPHIC STUDIES:  No results found.   ASSESSMENT/PLAN:  This is a very pleasant 68 year old Caucasian male with metastatic non-small cell lung cancer, adenocarcinoma.  He was initially diagnosed as a stage IIIa.  He presented with a left lower lobe lung mass in addition to mediastinal lymphadenopathy.  He was diagnosed in January 2016.  He is status post left lower lobectomy with lymph node dissection followed by concurrent chemoradiation.  His PDL 1 expression is 5%.  He was diagnosed with metastatic disease involving the left femur as well as a left supraclavicular nodal metastasis and right paratracheal lymphadenopathy in October 2018.  He was started on systemic chemotherapy with carboplatin, Alimta, and Keytruda.  Carboplatin was  discontinued after cycle #2 due to a hypersensitivity reaction.  Alimta was also discontinued after cycle #4 due to intolerance.  The patient has been on maintenance treatment with single agent Keytruda for 24 cycles.   The patient was seen with Dr. Julien Nordmann today.  Labs were reviewed.  We recommend that he proceed with cycle #25 today scheduled.  We will see him back for follow-up visit in 3 weeks for evaluation before starting cycle #26 of maintenance single agent Keytruda.  I have arranged for the patient to have a restaging CT scan prior to his follow up visit before cycle #27. He will be leaving town the week of 9/23-9/30 and has asked that we coordinate his scan around his vacation.  The patient will follow-up with his primary care provider regarding his cerumen impaction.   The patient was advised to call immediately if he has any concerning symptoms in the  interval. The patient voices understanding of current disease status and treatment options and is in agreement with the current care plan. All questions were answered. The patient knows to call the clinic with any problems, questions or concerns. We can certainly see the patient much sooner if necessary   Orders Placed This Encounter  Procedures  . CT Chest W Contrast    Please schedule this in the morning of 01/01/2019 at the earliest available time.    Standing Status:   Future    Standing Expiration Date:   11/13/2019    Scheduling Instructions:     Please schedule this in the morning of 01/01/2019 at the earliest available time.    Order Specific Question:   ** REASON FOR EXAM (FREE TEXT)    Answer:   Restaging Lung Cancer    Order Specific Question:   If indicated for the ordered procedure, I authorize the administration of contrast media per Radiology protocol    Answer:   Yes    Order Specific Question:   Preferred imaging location?    Answer:   [facility]    Order Specific Question:   Radiology Contrast Protocol -  do NOT remove file path    Answer:   \\[email or web address]  . CT Abdomen Pelvis W Contrast    Please schedule this in the morning of 01/01/2019 at the earliest available time.    Standing Status:   Future    Standing Expiration Date:   11/13/2019    Scheduling Instructions:     Please schedule this in the morning of 01/01/2019 at the earliest available time.    Order Specific Question:   ** REASON FOR EXAM (FREE TEXT)    Answer:   Restaging Lung Cancer    Order Specific Question:   If indicated for the ordered procedure, I authorize the administration of contrast media per Radiology protocol    Answer:   Yes    Order Specific Question:   Preferred imaging location?    Answer:   [facility]    Order Specific Question:   Is Oral Contrast requested for this exam?    Answer:   Yes, Per Radiology protocol    Order Specific Question:   Radiology Contrast Protocol - do NOT remove file path    Answer:   \\[email or web address]     Cassandra L Heilingoetter, PA-C 11/13/18  ADDENDUM: Hematology/Oncology Attending: I had a face-to-face encounter with the patient today.  I recommended his care plan.  This is a very pleasant 68 years old white male with metastatic non-small cell lung cancer, adenocarcinoma status post induction systemic chemotherapy with carboplatin, Alimta and Keytruda and he is currently on maintenance treatment with single agent Keytruda status post 24 cycles.  The patient has been tolerating his treatment well with no concerning adverse effects. I recommended for him to proceed with cycle #25 today as planned. I will see him back for follow-up visit in 3 weeks for evaluation before the next cycle of his treatment. Regarding the staging work-up, we will arrange for the patient to have repeat CT scan of the chest, abdomen pelvis on October 1 before his visit and treatment that day. He was advised to call immediately if he has any  concerning symptoms in the interval.  Disclaimer: This note was dictated with voice recognition software. Similar sounding words can inadvertently be transcribed and may be missed upon review. Eilleen Kempf, MD 11/13/18

## 2018-11-13 NOTE — Patient Instructions (Signed)
[facility] Discharge Instructions for Patients Receiving Chemotherapy  Today you received the following chemotherapy agents:  Keytruda.  To help prevent nausea and vomiting after your treatment, we encourage you to take your nausea medication as directed.   If you develop nausea and vomiting that is not controlled by your nausea medication, call the clinic.   BELOW ARE SYMPTOMS THAT SHOULD BE REPORTED IMMEDIATELY:  *FEVER GREATER THAN 100.5 F  *CHILLS WITH OR WITHOUT FEVER  NAUSEA AND VOMITING THAT IS NOT CONTROLLED WITH YOUR NAUSEA MEDICATION  *UNUSUAL SHORTNESS OF BREATH  *UNUSUAL BRUISING OR BLEEDING  TENDERNESS IN MOUTH AND THROAT WITH OR WITHOUT PRESENCE OF ULCERS  *URINARY PROBLEMS  *BOWEL PROBLEMS  UNUSUAL RASH Items with * indicate a potential emergency and should be followed up as soon as possible.  Feel free to call the clinic should you have any questions or concerns. The clinic phone number is [phone].  Please show the CHEMO ALERT CARD at check-in to the Emergency Department and triage nurse.    

## 2018-11-13 NOTE — Telephone Encounter (Signed)
No 8/13 los.

## 2018-12-04 ENCOUNTER — Inpatient Hospital Stay: Payer: Medicare HMO | Attending: Internal Medicine

## 2018-12-04 ENCOUNTER — Inpatient Hospital Stay: Payer: Medicare HMO

## 2018-12-04 ENCOUNTER — Inpatient Hospital Stay (HOSPITAL_BASED_OUTPATIENT_CLINIC_OR_DEPARTMENT_OTHER): Payer: Medicare HMO | Admitting: Internal Medicine

## 2018-12-04 ENCOUNTER — Encounter: Payer: Self-pay | Admitting: Internal Medicine

## 2018-12-04 ENCOUNTER — Other Ambulatory Visit: Payer: Self-pay

## 2018-12-04 VITALS — BP 127/91 | HR 55 | Temp 98.2°F | Resp 18 | Ht 73.0 in | Wt 263.4 lb

## 2018-12-04 DIAGNOSIS — C3492 Malignant neoplasm of unspecified part of left bronchus or lung: Secondary | ICD-10-CM

## 2018-12-04 DIAGNOSIS — Z923 Personal history of irradiation: Secondary | ICD-10-CM | POA: Diagnosis not present

## 2018-12-04 DIAGNOSIS — Z5112 Encounter for antineoplastic immunotherapy: Secondary | ICD-10-CM | POA: Insufficient documentation

## 2018-12-04 DIAGNOSIS — C3432 Malignant neoplasm of lower lobe, left bronchus or lung: Secondary | ICD-10-CM | POA: Diagnosis not present

## 2018-12-04 DIAGNOSIS — Z79899 Other long term (current) drug therapy: Secondary | ICD-10-CM | POA: Diagnosis not present

## 2018-12-04 DIAGNOSIS — C7951 Secondary malignant neoplasm of bone: Secondary | ICD-10-CM

## 2018-12-04 DIAGNOSIS — Z902 Acquired absence of lung [part of]: Secondary | ICD-10-CM | POA: Diagnosis not present

## 2018-12-04 DIAGNOSIS — E039 Hypothyroidism, unspecified: Secondary | ICD-10-CM | POA: Diagnosis not present

## 2018-12-04 DIAGNOSIS — Z9221 Personal history of antineoplastic chemotherapy: Secondary | ICD-10-CM | POA: Diagnosis not present

## 2018-12-04 DIAGNOSIS — Z95828 Presence of other vascular implants and grafts: Secondary | ICD-10-CM

## 2018-12-04 LAB — CMP (CANCER CENTER ONLY)
ALT: 23 U/L (ref 0–44)
AST: 24 U/L (ref 15–41)
Albumin: 4 g/dL (ref 3.5–5.0)
Alkaline Phosphatase: 90 U/L (ref 38–126)
Anion gap: 8 (ref 5–15)
BUN: 17 mg/dL (ref 8–23)
CO2: 26 mmol/L (ref 22–32)
Calcium: 9 mg/dL (ref 8.9–10.3)
Chloride: 106 mmol/L (ref 98–111)
Creatinine: 0.96 mg/dL (ref 0.61–1.24)
GFR, Est AFR Am: >60 mL/min (ref >60)
GFR, Estimated: >60 mL/min (ref >60)
Glucose, Bld: 136 mg/dL — ABNORMAL HIGH (ref 70–99)
Potassium: 4.5 mmol/L (ref 3.5–5.1)
Sodium: 140 mmol/L (ref 135–145)
Total Bilirubin: 1 mg/dL (ref 0.3–1.2)
Total Protein: 6.4 g/dL — ABNORMAL LOW (ref 6.5–8.1)

## 2018-12-04 LAB — CBC WITH DIFFERENTIAL (CANCER CENTER ONLY)
Abs Immature Granulocytes: 0.02 10*3/uL (ref 0.00–0.07)
Basophils Absolute: 0 10*3/uL (ref 0.0–0.1)
Basophils Relative: 0 %
Eosinophils Absolute: 0.1 10*3/uL (ref 0.0–0.5)
Eosinophils Relative: 2 %
HCT: 44.5 % (ref 39.0–52.0)
Hemoglobin: 15.6 g/dL (ref 13.0–17.0)
Immature Granulocytes: 0 %
Lymphocytes Relative: 21 %
Lymphs Abs: 1.3 10*3/uL (ref 0.7–4.0)
MCH: 34.4 pg — ABNORMAL HIGH (ref 26.0–34.0)
MCHC: 35.1 g/dL (ref 30.0–36.0)
MCV: 98.2 fL (ref 80.0–100.0)
Monocytes Absolute: 0.4 10*3/uL (ref 0.1–1.0)
Monocytes Relative: 7 %
Neutro Abs: 4.3 10*3/uL (ref 1.7–7.7)
Neutrophils Relative %: 70 %
Platelet Count: 151 10*3/uL (ref 150–400)
RBC: 4.53 MIL/uL (ref 4.22–5.81)
RDW: 13.9 % (ref 11.5–15.5)
WBC Count: 6.2 10*3/uL (ref 4.0–10.5)
nRBC: 0 % (ref 0.0–0.2)

## 2018-12-04 LAB — TSH: TSH: 3.925 u[IU]/mL (ref 0.320–4.118)

## 2018-12-04 MED ORDER — SODIUM CHLORIDE 0.9% FLUSH
10.0000 mL | INTRAVENOUS | Status: DC | PRN
  Administered 2018-12-04: 14:00:00 10 mL
  Filled 2018-12-04: qty 10

## 2018-12-04 MED ORDER — SODIUM CHLORIDE 0.9 % IV SOLN
Freq: Once | INTRAVENOUS | Status: AC
  Administered 2018-12-04: 13:00:00 via INTRAVENOUS
  Filled 2018-12-04: qty 250

## 2018-12-04 MED ORDER — ALTEPLASE 2 MG IJ SOLR
2.0000 mg | Freq: Once | INTRAMUSCULAR | Status: AC | PRN
  Administered 2018-12-04: 12:00:00 2 mg
  Filled 2018-12-04: qty 2

## 2018-12-04 MED ORDER — SODIUM CHLORIDE 0.9 % IV SOLN
200.0000 mg | Freq: Once | INTRAVENOUS | Status: AC
  Administered 2018-12-04: 200 mg via INTRAVENOUS
  Filled 2018-12-04: qty 8

## 2018-12-04 MED ORDER — SODIUM CHLORIDE 0.9% FLUSH
10.0000 mL | INTRAVENOUS | Status: DC | PRN
  Administered 2018-12-04: 11:00:00 10 mL
  Filled 2018-12-04: qty 10

## 2018-12-04 MED ORDER — ALTEPLASE 2 MG IJ SOLR
INTRAMUSCULAR | Status: AC
  Filled 2018-12-04: qty 2

## 2018-12-04 MED ORDER — HEPARIN SOD (PORK) LOCK FLUSH 100 UNIT/ML IV SOLN
500.0000 [IU] | Freq: Once | INTRAVENOUS | Status: AC | PRN
  Administered 2018-12-04: 500 [IU]
  Filled 2018-12-04: qty 5

## 2018-12-04 NOTE — Progress Notes (Signed)
Unable to get blood return from port. Cathflo administered by Amy RN. Patient sent back to lab to get labs drawn from arm

## 2018-12-04 NOTE — Progress Notes (Signed)
[place] [phone]   [phone]  OFFICE PROGRESS NOTE  [name], MD [address] [place] [address]  DIAGNOSIS: Metastatic non-small cell lung cancer initially diagnosed as stage IIIA (T2a, N2, M0) non-small cell lung cancer, poorly differentiated adenocarcinoma presented with left lower lobe lung mass in addition to mediastinal lymphadenopathy.  The patient was diagnosed with metastatic disease involving the left femur as well as left supraclavicular nodal metastases and right paratracheal lymphadenopathy in October 2018.  Biomarker Findings Microsatellite Status - MS-Stable Tumor Mutational Burden - TMB-Low (3 Muts/Mb) Genomic Findings For a complete list of the genes assayed, please refer to the Appendix. STK11 P287f*6 [id number] site 1676+1P>JDAXX E374* MLL2 [id number] NBN K23372f NOTCH2 [id number] splice site [id number]+5Y>KDisease relevant genes with no reportable alterations: EGFR, KRAS, ALK, BRAF, MET, RET, ERBB2, ROS1   PDL1 expression 5%  PRIOR THERAPY: 1) status post wedge resection of the left lower lobe lung mass as well as AP window lymph node dissection but there was residual metastatic mediastinal lymphadenopathy that could not be resected. 2) a course of concurrent chemoradiation [place] carboplatin [place]  3) status post palliative radiotherapy to the left femur metastatic bone disease. 4)  Systemic chemotherapy with carboplatin for AUC of 5, Alimta 500 mg/M2 and Keytruda 200 mg IV every 3 weeks.  First dose March 07, 2017.  Carboplatin was discontinued during cycle #2 secondary to hypersensitivity reaction. 5) status post 2 cycles of maintenance treatment with Alimta and Ketruda (pembrolizumab).  Alimta was discontinued secondary to intolerance.  CURRENT THERAPY: Maintenance treatment with single agent Ketruda (pembrolizumab) status post 23   cycles.  INTERVAL HISTORY: MicSALVATORE SHEAR 43o. male returns to the clinic today for follow-up visit.  The patient is feeling fine today with no concerning complaints except for fatigue and shortness of breath with exertion.  He denied having any current chest pain, cough or hemoptysis.  He denied having any fever or chills.  He has no nausea, vomiting, diarrhea or constipation.  He has no headache or visual changes.  He continues to tolerate his treatment with maintenance Keytruda fairly well.  The patient is here today for evaluation before starting cycle #24.  MEDICAL HISTORY: Past Medical History:  Diagnosis Date  . Adenocarcinoma of left lung, stage 3 ([place]/16/2018  . Arthritis   . Atrial [place] ([place]16[place]  . [place]rial fibrillation ([place] . COPD (chronic obstructive pulmonary disease) ([place]16/2018  . History of chemotherapy   . History of radiation therapy   . Hyperlipidemia   . Hypothyroid 08/15/2016  . Longstanding persistent atrial fibrillation 08/29/2016  . Pathologic fracture    left femur  . Pneumonitis   . S/P TURP 08/15/2016  . Wears glasses   . Wears hearing aid in both ears     ALLERGIES:  is allergic to carboplatin and flecainide.  MEDICATIONS:  Current Outpatient Medications  Medication Sig Dispense Refill  . apixaban (ELIQUIS) 5 MG TABS tablet Take 1 tablet (5 mg total) by mouth 2 (two) times daily. 180 tablet 3  . atenolol (TENORMIN) 50 MG tablet Take 1 tablet (50 mg total) by mouth daily. 90 tablet 3  . atorvastatin (LIPITOR) 40 MG tablet Take 40 mg by mouth daily.    . citalopram (CELEXA) 40 MG tablet Take 40 mg by mouth daily.    . iMarland Kitchenuprofen (ADVIL,MOTRIN) 400 MG tablet Take 400 mg  by mouth every 4 (four) hours as needed for moderate pain.    Marland Kitchen levothyroxine (SYNTHROID, LEVOTHROID) 150 MCG tablet Take 150 mcg by mouth daily.    . tamsulosin (FLOMAX) 0.4 MG CAPS capsule Take 0.4 mg daily by mouth.      No current facility-administered  medications for this visit.    Facility-Administered Medications Ordered in Other Visits  Medication Dose Route Frequency Provider Last Rate Last Dose  . sodium chloride flush (NS) 0.9 % injection 10 mL  10 mL Intracatheter PRN Curt Bears, MD   10 mL at 12/04/18 1126    SURGICAL HISTORY:  Past Surgical History:  Procedure Laterality Date  . BRONCHOSCOPY  10/2014  . CARDIAC CATHETERIZATION     05/07/12  . CARDIOVERSION     x2  . COLONOSCOPY    . DG BIOPSY LUNG Left 10/2014   FNA - Adenocarcinoma   . FEMUR IM NAIL Left 02/19/2017  . FEMUR IM NAIL Left 02/19/2017   Procedure: INTRAMEDULLARY (IM) NAIL FEMORAL;  Surgeon: Marchia Bond, MD;  Location: [facility];  Service: Orthopedics;  Laterality: Left;  . IR FLUORO GUIDE PORT INSERTION RIGHT  07/03/2017  . IR US GUIDE VASC ACCESS RIGHT  07/03/2017  . LUNG CANCER SURGERY Left 12/2014   Wedge Resection   . MULTIPLE TOOTH EXTRACTIONS    . Status post TURP    . TONSILLECTOMY      REVIEW OF SYSTEMS:  A comprehensive review of systems was negative except for: Constitutional: positive for fatigue Respiratory: positive for dyspnea on exertion   PHYSICAL EXAMINATION: General appearance: alert, cooperative, fatigued and no distress Head: Normocephalic, without obvious abnormality, atraumatic Neck: no adenopathy, no JVD, supple, symmetrical, trachea midline and thyroid not enlarged, symmetric, no tenderness/mass/nodules Lymph nodes: Cervical, supraclavicular, and axillary nodes normal. Resp: clear to auscultation bilaterally Back: symmetric, no curvature. ROM normal. No CVA tenderness. Cardio: regular rate and rhythm, S1, S2 normal, no murmur, click, rub or gallop GI: soft, non-tender; bowel sounds normal; no masses,  no organomegaly Extremities: extremities normal, atraumatic, no cyanosis or edema  ECOG PERFORMANCE STATUS: 1 - Symptomatic but completely ambulatory  Blood pressure (!) 127/91, pulse (!) 55, temperature 98.2 F (36.8 C),  temperature source Oral, resp. rate 18, height 6' 1" (1.854 m), weight 263 lb 6.4 oz (119.5 kg), SpO2 95 %.  LABORATORY DATA: Lab Results  Component Value Date   WBC 7.2 11/13/2018   HGB 15.9 11/13/2018   HCT 45.3 11/13/2018   MCV 95.8 11/13/2018   PLT 166 11/13/2018      Chemistry      Component Value Date/Time   NA 139 11/13/2018 1310   NA 136 04/04/2017 1141   K 4.5 11/13/2018 1310   K 4.4 04/04/2017 1141   CL 106 11/13/2018 1310   CO2 25 11/13/2018 1310   CO2 24 04/04/2017 1141   BUN 17 11/13/2018 1310   BUN 21.1 04/04/2017 1141   CREATININE 0.99 11/13/2018 1310   CREATININE 1.1 04/04/2017 1141      Component Value Date/Time   CALCIUM 9.3 11/13/2018 1310   CALCIUM 9.1 04/04/2017 1141   ALKPHOS 92 11/13/2018 1310   ALKPHOS 89 04/04/2017 1141   AST 22 11/13/2018 1310   AST 17 04/04/2017 1141   ALT 26 11/13/2018 1310   ALT 18 04/04/2017 1141   BILITOT 1.1 11/13/2018 1310   BILITOT 0.67 04/04/2017 1141       RADIOGRAPHIC STUDIES: No results found.  ASSESSMENT AND PLAN: This is  a 68 years old white male with metastatic non-small cell lung cancer, adenocarcinoma with no actionable mutations and PDL 1 expression of 5% that was initially diagnosed as stage IIIa non-small cell lung cancer, adenocarcinoma status post left lower lobectomy with lymph node dissection followed by a course of concurrent chemoradiation completed in January 2016.  The patient had evidence for disease metastasis in October 2018 with metastatic disease to the left femur as well as left supraclavicular and right paratracheal lymph nodes. The patient is currently on systemic chemotherapy initially was with carboplatin, Alimta and Keytruda.  Carboplatin was discontinued secondary to hypersensitivity reaction starting from cycle #2.   He was also treated with 3 cycles of maintenance Alimta and Ketruda (pembrolizumab) but Alimta was discontinued secondary to intolerance. He is currently undergoing  treatment with maintenance Keytruda as a single agent status post 23 cycles.  The patient continues to tolerate his treatment well with no concerning adverse effects. I recommended for him to proceed with cycle #24 today as planned. He will come back for follow-up visit in 4 weeks for evaluation after repeating CT scan of the chest, abdomen and pelvis for restaging of his disease. For the hypothyroidism he will continue his current treatment with levothyroxine and we will continue to monitor his TSH closely. The patient was advised to call immediately if he has any concerning symptoms in the interval. The patient voices understanding of current disease status and treatment options and is in agreement with the current care plan. All questions were answered. The patient knows to call the clinic with any problems, questions or concerns. We can certainly see the patient much sooner if necessary.  Disclaimer: This note was dictated with voice recognition software. Similar sounding words can inadvertently be transcribed and may not be corrected upon review.

## 2018-12-05 ENCOUNTER — Telehealth: Payer: Self-pay | Admitting: Internal Medicine

## 2018-12-05 NOTE — Telephone Encounter (Signed)
Scheduled appt per 9/3 los - pt to get an updated schedule next visit. - additional cycles added to what was already on schedule.

## 2018-12-23 DIAGNOSIS — H60333 Swimmer's ear, bilateral: Secondary | ICD-10-CM | POA: Diagnosis not present

## 2018-12-23 DIAGNOSIS — H6123 Impacted cerumen, bilateral: Secondary | ICD-10-CM | POA: Diagnosis not present

## 2018-12-25 ENCOUNTER — Ambulatory Visit: Payer: Medicare HMO | Admitting: Internal Medicine

## 2018-12-25 ENCOUNTER — Ambulatory Visit: Payer: Medicare HMO

## 2018-12-25 ENCOUNTER — Other Ambulatory Visit: Payer: Medicare HMO

## 2018-12-31 ENCOUNTER — Telehealth: Payer: Self-pay | Admitting: Medical Oncology

## 2018-12-31 NOTE — Telephone Encounter (Signed)
Victor Huber wants to have his f/u appts next Tuesday . Schedule message sent.

## 2018-12-31 NOTE — Telephone Encounter (Signed)
LVM to return my call re : r/s f/u appt. to after scan.

## 2018-12-31 NOTE — Telephone Encounter (Addendum)
Insurance authorized tx tomorrow so I  told pt to keep appt tomorrow.

## 2018-12-31 NOTE — Progress Notes (Signed)
[place] OFFICE PROGRESS NOTE  Victor Dy, MD [address], [place] [address]  DIAGNOSIS: Metastatic non-small cell lung cancer initially diagnosed as stage IIIA (T2a, N2, M0) non-small cell lung cancer, poorly differentiated adenocarcinoma presented with left lower lobe lung mass in addition to mediastinal lymphadenopathy. The patient was diagnosed with metastatic disease involving the left femur as well as left supraclavicular nodal metastases and right paratracheal lymphadenopathy in October 2018.  Biomarker Findings Microsatellite Status - MS-Stable Tumor Mutational Burden - TMB-Low (3 Muts/Mb) Genomic Findings For a complete list of the genes assayed, please refer to the Appendix. STK11 P240f*6 [id number] site 1009+2Z>RDAXX E374* MLL2 [id number] NBN K23360f NOTCH2 [id number] splice site 988633+3L>KDisease relevant genes with no reportable alterations: EGFR, KRAS, ALK, BRAF, MET, RET, ERBB2, ROS1   PDL1 expression5%  PRIOR THERAPY: 1) status post wedge resection of the left lower lobe lung mass as well as AP window lymph node dissection but there was residual metastatic mediastinal lymphadenopathy that could not be resected. 2) a course of concurrent chemorad[place] carbopl[place]  3) status post palliative radiotherapy to the left femur metastatic bone disease. 4) Systemic chemotherapy with carboplatin for AUC of 5, Alimta 500 mg/M2 and Keytruda 200 mg IV every 3 weeks. First dose March 07, 2017. Carboplatin was discontinued during cycle #2 secondary to hypersensitivity reaction. 5) status post 2 cycles of maintenance treatment with Alimta and Ketruda (pembrolizumab). Alimta was discontinued secondary to intolerance  CURRENT THERAPY: Maintenance treatment with single agent Ketruda (pembrolizumab) status post 27 cycles.  INTERVAL HISTORY: Victor Huber 68o. male  returns to the clinic for a follow up visit. The patient is feeling fair today without any concerning complaints except for continued shortness of breath with exertion. The patient states that he used to have an inhaler which helped but that he has not used it in several years. He has an appointment with his new PCP on Monday 01/05/2019 and will discuss with him recommendations regarding management for his COPD. He also notes some 3/10 pain in his left femur near the site of his prior metastatic disease. The patient attributes this to increased activity from his recent travels. The [facility] any adverse effects some mild rashes on hischeeks/eyebrows and fatigue. Denies any fever, chills, night sweats, or weight loss. Denies any chest pain or hemoptysis. Denies any nausea, vomiting, or constipation. He experienced diarrhea after drinking the contrast from his recent scan. The patient denies any nasal congestion, sore throat, skin infections, abdominal pain, or dysuria. Denies any headache or visual changes. The patient recently had a restaging CT scan performed. The patient is here today for evaluation and to review his scan results prior to starting cycle #28 of single agent Keytruda.  MEDICAL HISTORY: Past Medical History:  Diagnosis Date  . Adenocarcinoma of left lung, stage 3 ([facility]/16/2018  . Arthritis   . Atrial fibrillation ([place]/16/2018  . Atrial fibrillation ([place] COPD (chronic obstructive pulmonary disease) ([facility]/16/2018  . History of chemotherapy   . History of radiation therapy   . Hyperlipidemia   . Hypothyroid 08/15/2016  . Longstanding persistent atrial fibrillation ([place]/30/2018  . Pathologic fracture    left femur  . Pneumonitis   . S/P TURP 08/15/2016  . Wears glasses   . Wears hearing aid in both ears     ALLERGIES:  is  allergic to carboplatin and flecainide.  MEDICATIONS:  Current Outpatient Medications   Medication Sig Dispense Refill  . apixaban (ELIQUIS) 5 MG TABS tablet Take 1 tablet (5 mg total) by mouth 2 (two) times daily. 180 tablet 3  . atenolol (TENORMIN) 50 MG tablet Take 1 tablet (50 mg total) by mouth daily. 90 tablet 3  . atorvastatin (LIPITOR) 40 MG tablet Take 40 mg by mouth daily.    . citalopram (CELEXA) 40 MG tablet Take 40 mg by mouth daily.    Marland Kitchen ibuprofen (ADVIL,MOTRIN) 400 MG tablet Take 400 mg by mouth every 4 (four) hours as needed for moderate pain.    Marland Kitchen levothyroxine (SYNTHROID, LEVOTHROID) 150 MCG tablet Take 150 mcg by mouth daily.    . tamsulosin (FLOMAX) 0.4 MG CAPS capsule Take 0.4 mg daily by mouth.      No current facility-administered medications for this visit.    Facility-Administered Medications Ordered in Other Visits  Medication Dose Route Frequency Provider Last Rate Last Dose  . heparin lock flush 100 unit/mL  500 Units Intracatheter Once PRN Curt Bears, MD      . pembrolizumab [facility]) 200 mg in sodium chloride 0.9 % 50 mL chemo infusion  200 mg Intravenous Once Curt Bears, MD 116 mL/hr at 01/01/19 1547 200 mg at 01/01/19 1547  . sodium chloride (PF) 0.9 % injection           . sodium chloride flush (NS) 0.9 % injection 10 mL  10 mL Intracatheter PRN Curt Bears, MD        SURGICAL HISTORY:  Past Surgical History:  Procedure Laterality Date  . BRONCHOSCOPY  10/2014  . CARDIAC CATHETERIZATION     05/07/12  . CARDIOVERSION     x2  . COLONOSCOPY    . DG BIOPSY LUNG Left 10/2014   FNA - Adenocarcinoma   . FEMUR IM NAIL Left 02/19/2017  . FEMUR IM NAIL Left 02/19/2017   Procedure: INTRAMEDULLARY (IM) NAIL FEMORAL;  Surgeon: Marchia Bond, MD;  Location: [place];  Service: Orthopedics;  Laterality: Left;  . IR FLUORO GUIDE PORT INSERTION RIGHT  07/03/2017  . IR US GUIDE VASC ACCESS RIGHT  07/03/2017  . LUNG CANCER SURGERY Left 12/2014   Wedge Resection   . MULTIPLE TOOTH EXTRACTIONS    . Status post TURP    . TONSILLECTOMY       REVIEW OF SYSTEMS:   Review of Systems  Constitutional: Positive for fatigue. Negative for appetite change, chills, fever and unexpected weight change.  HENT: Negative for mouth sores, nosebleeds, sore throat and trouble swallowing.   Eyes: Negative for eye problems and icterus.  Respiratory: Positive for shortness of breath with exertion and baseline cough. Negative for hemoptysis and wheezing.   Cardiovascular: Negative for chest pain and leg swelling.  Gastrointestinal: Positive for diarrhea following oral contrast for CT scan. Negative for abdominal pain, constipation, nausea and vomiting.  Genitourinary: Negative for bladder incontinence, difficulty urinating, dysuria, frequency and hematuria.   Musculoskeletal: Positive for 3/10 left femur pain. Negative for back pain, gait problem, neck pain and neck stiffness.  Skin: Positive for rash on his eyebrows and cheeks with associated itching. Neurological: Negative for dizziness, extremity weakness, gait problem, headaches, light-headedness and seizures.  Hematological: Negative for adenopathy. Does not bruise/bleed easily.  Psychiatric/Behavioral: Negative for confusion, depression and sleep disturbance. The patient is not nervous/anxious.     PHYSICAL EXAMINATION:  Blood pressure 126/86, pulse 61, temperature 98.7 F (37.1 C), temperature source Oral,  resp. rate 19, height 6' 1"  (1.854 m), weight 260 lb 1.6 oz (118 kg), SpO2 92 %.  ECOG PERFORMANCE STATUS: 1 - Symptomatic but completely ambulatory  Physical Exam  Constitutional: Oriented to person, place, and time and well-developed, well-nourished, and in no distress. HENT:  Head: Normocephalic and atraumatic.  Mouth/Throat: Oropharynx is clear and moist. No oropharyngeal exudate.  Eyes: Conjunctivae are normal. Right eye exhibits no discharge. Left eye exhibits no discharge. No scleral icterus.  Neck: Normal range of motion. Neck supple.  Cardiovascular: Irregular rhythm.  Normal rate normal heart sounds and intact distal pulses.   Pulmonary/Chest: Effort normal and breath sounds normal. No respiratory distress. No wheezes. No rales.  Abdominal: Soft. Bowel sounds are normal. Exhibits no distension and no mass. There is no tenderness.  Musculoskeletal: Normal range of motion. Exhibits no edema.  Lymphadenopathy:    No cervical adenopathy.  Neurological: Alert and oriented to person, place, and time. Exhibits normal muscle tone. Gait normal. Coordination normal.  Skin: Skin is warm and dry. No rash noted. Not diaphoretic. No erythema. No pallor.  Psychiatric: Mood, memory and judgment normal.  Vitals reviewed.  LABORATORY DATA: Lab Results  Component Value Date   WBC 14.3 (H) 01/01/2019   HGB 16.2 01/01/2019   HCT 46.7 01/01/2019   MCV 99.6 01/01/2019   PLT 181 01/01/2019      Chemistry      Component Value Date/Time   NA 140 01/01/2019 1325   NA 136 04/04/2017 1141   K 4.4 01/01/2019 1325   K 4.4 04/04/2017 1141   CL 105 01/01/2019 1325   CO2 25 01/01/2019 1325   CO2 24 04/04/2017 1141   BUN 16 01/01/2019 1325   BUN 21.1 04/04/2017 1141   CREATININE 1.04 01/01/2019 1325   CREATININE 1.1 04/04/2017 1141      Component Value Date/Time   CALCIUM 8.9 01/01/2019 1325   CALCIUM 9.1 04/04/2017 1141   ALKPHOS 85 01/01/2019 1325   ALKPHOS 89 04/04/2017 1141   AST 21 01/01/2019 1325   AST 17 04/04/2017 1141   ALT 20 01/01/2019 1325   ALT 18 04/04/2017 1141   BILITOT 1.0 01/01/2019 1325   BILITOT 0.67 04/04/2017 1141       RADIOGRAPHIC STUDIES:  Ct Chest W Contrast  Result Date: 01/01/2019 CLINICAL DATA:  Restaging of metastatic lung cancer. EXAM: CT CHEST, ABDOMEN, AND PELVIS WITH CONTRAST TECHNIQUE: Multidetector CT imaging of the chest, abdomen and pelvis was performed following the standard protocol during bolus administration of intravenous contrast. CONTRAST:  129m OMNIPAQUE IOHEXOL 300 MG/ML  SOLN COMPARISON:  Multiple exams,  including 09/18/2018 FINDINGS: CT CHEST FINDINGS Cardiovascular: Right Port-A-Cath tip: Right atrium. Coronary, aortic arch, and branch vessel atherosclerotic vascular disease. Mild cardiomegaly, with particular enlargement the right heart parotid. Mild calcification of the aortic valve leaflets. Stable fluid in the superior pericardial recess anteriorly. Mediastinum/Nodes: Right paratracheal node 1.3 cm in short axis on image 18/2, stable by my measurements. Additional smaller paratracheal nodes are likewise stable. Subcarinal node 1.8 cm in short axis, image 33/2, stable. Borderline esophageal wall thickening in the mid esophagus, stable. Lungs/Pleura: Stable trace left pleural fluid. Stable left infrahilar airspace opacity with air bronchograms and postoperative findings. Some of the findings may be from prior radiation therapy. Stable emphysema and peripheral interstitial accentuation in both lungs Musculoskeletal: Nonunion at the site of the left seventh rib osteotomy. Thoracic spondylosis. CT ABDOMEN PELVIS FINDINGS Hepatobiliary: Stable 4 mm hypodense lesion in segment 2 of the liver,  image 50/2. Numerous small gallstones in the gallbladder. No biliary dilatation. Pancreas: Unremarkable Spleen: Unremarkable Adrenals/Urinary Tract: Stable small renal cysts. Adrenal glands normal. Stomach/Bowel: Unremarkable Vascular/Lymphatic: Aortoiliac atherosclerotic vascular disease. A 1.0 cm right gastric lymph node on image 58/2 previously measured 0.7 cm. Reproductive: Mild prostatomegaly with stable accentuated density along the seminal vesicles. Other: No supplemental non-categorized findings. Musculoskeletal: Left hip screw. Interbody fusion at L5-S1. Multilevel lumbar impingement due to spurring. IMPRESSION: 1. Stable mild mediastinal adenopathy. A right gastric node is currently borderline prominent at 1.0 cm in diameter, previously 0.7 cm. 2. Stable postoperative and post radiation therapy findings in the left  infrahilar region. 3. No compelling findings of progressive malignancy. 4. Other imaging findings of potential clinical significance: Aortic Atherosclerosis (ICD10-I70.0). Coronary atherosclerosis. Mild cardiomegaly. Mild aortic valve calcification. Possible mild esophagitis given wall thickening in the mid esophagus, stable. Cholelithiasis. Mild prostatomegaly. Multilevel lumbar impingement. Electronically Signed   By: Van Clines M.D.   On: 01/01/2019 11:08   Ct Abdomen Pelvis W Contrast  Result Date: 01/01/2019 CLINICAL DATA:  Restaging of metastatic lung cancer. EXAM: CT CHEST, ABDOMEN, AND PELVIS WITH CONTRAST TECHNIQUE: Multidetector CT imaging of the chest, abdomen and pelvis was performed following the standard protocol during bolus administration of intravenous contrast. CONTRAST:  168m OMNIPAQUE IOHEXOL 300 MG/ML  SOLN COMPARISON:  Multiple exams, including 09/18/2018 FINDINGS: CT CHEST FINDINGS Cardiovascular: Right Port-A-Cath tip: Right atrium. Coronary, aortic arch, and branch vessel atherosclerotic vascular disease. Mild cardiomegaly, with particular enlargement the right heart parotid. Mild calcification of the aortic valve leaflets. Stable fluid in the superior pericardial recess anteriorly. Mediastinum/Nodes: Right paratracheal node 1.3 cm in short axis on image 18/2, stable by my measurements. Additional smaller paratracheal nodes are likewise stable. Subcarinal node 1.8 cm in short axis, image 33/2, stable. Borderline esophageal wall thickening in the mid esophagus, stable. Lungs/Pleura: Stable trace left pleural fluid. Stable left infrahilar airspace opacity with air bronchograms and postoperative findings. Some of the findings may be from prior radiation therapy. Stable emphysema and peripheral interstitial accentuation in both lungs Musculoskeletal: Nonunion at the site of the left seventh rib osteotomy. Thoracic spondylosis. CT ABDOMEN PELVIS FINDINGS Hepatobiliary: Stable 4 mm  hypodense lesion in segment 2 of the liver, image 50/2. Numerous small gallstones in the gallbladder. No biliary dilatation. Pancreas: Unremarkable Spleen: Unremarkable Adrenals/Urinary Tract: Stable small renal cysts. Adrenal glands normal. Stomach/Bowel: Unremarkable Vascular/Lymphatic: Aortoiliac atherosclerotic vascular disease. A 1.0 cm right gastric lymph node on image 58/2 previously measured 0.7 cm. Reproductive: Mild prostatomegaly with stable accentuated density along the seminal vesicles. Other: No supplemental non-categorized findings. Musculoskeletal: Left hip screw. Interbody fusion at L5-S1. Multilevel lumbar impingement due to spurring. IMPRESSION: 1. Stable mild mediastinal adenopathy. A right gastric node is currently borderline prominent at 1.0 cm in diameter, previously 0.7 cm. 2. Stable postoperative and post radiation therapy findings in the left infrahilar region. 3. No compelling findings of progressive malignancy. 4. Other imaging findings of potential clinical significance: Aortic Atherosclerosis (ICD10-I70.0). Coronary atherosclerosis. Mild cardiomegaly. Mild aortic valve calcification. Possible mild esophagitis given wall thickening in the mid esophagus, stable. Cholelithiasis. Mild prostatomegaly. Multilevel lumbar impingement. Electronically Signed   By: WVan ClinesM.D.   On: 01/01/2019 11:08     ASSESSMENT/PLAN:  This is a very pleasant 68year old Caucasian male with metastatic non-small cell lung cancer, adenocarcinoma.  He was initially diagnosed as a stage IIIa.  He presented with a left lower lobe lung mass in addition to mediastinal lymphadenopathy.  He was diagnosed in  January 2016.  He is status post left lower lobectomy with lymph node dissection followed by concurrent chemoradiation.  His PDL 1 expression is 5%.  He was diagnosed with metastatic disease involving the left femur as well as a left supraclavicular nodal metastasis and right paratracheal  lymphadenopathy in October 2018.  He was started on systemic chemotherapy with carboplatin, Alimta, and Keytruda.  Carboplatin was discontinued after cycle #2 due to a hypersensitivity reaction.  Alimta was also discontinued after cycle #4 due to intolerance.  The patient has been on maintenance treatment with single agent Keytruda for 27 cycles.   The patient recently had a restaging CT scan performed. Dr. Julien Nordmann personally and independently reviewed the scan and discussed the results with the patient today. The scan showed no evidence of disease progression. Dr. Julien Nordmann recommends that the patient proceed with cycle #28 today of single agent Keytruda.  We will see him back for follow-up visit in 3 weeks for evaluation before starting cycle #29 of maintenance single agent Keytruda.  Regarding the patient's leg pain, Dr. Julien Nordmann discussed further evaluation of his knee for disease recurrence. The patient believes the pain is attributed to increased activity. The patient knows that if the pain worsens or fails to improve to please contact use and we can further evaluate this concern.  The patient will discuss COPD medications/inhalers with his PCP at his appointment next Monday. The patient is also followed by cardiology for his persistent atrial fibrillation.   Regarding the diarrhea secondary to the oral contrast, we discussed that going forward the patient may be interested in drinking the water based contrast.   The patient was advised to call immediately if he has any concerning symptoms in the interval. The patient voices understanding of current disease status and treatment options and is in agreement with the current care plan. All questions were answered. The patient knows to call the clinic with any problems, questions or concerns. We can certainly see the patient much sooner if necessary  No orders of the defined types were placed in this encounter.    Jeshawn Melucci L Shriley Joffe,  PA-C 01/01/19  ADDENDUM: Hematology/Oncology Attending: I had a face-to-face encounter with the patient.  I recommended his care plan.  This is a very pleasant 68 years old white male with metastatic non-small cell lung cancer, adenocarcinoma status post induction systemic chemotherapy with carboplatin, Alimta and Keytruda.  His treatment with carboplatin and Alimta was discontinued secondary to hypersensitivity reaction as well as toxicity. He is currently undergoing treatment with single agent Keytruda status post 27 cycles.  He has been tolerating this treatment well with no concerning adverse effects. He had repeat CT scan of the chest, abdomen pelvis performed earlier today.  I personally and independently reviewed the scans and discussed the results with the patient today.  Has a scan showed no concerning findings for disease progression. I recommended for the patient to continue his current treatment with [facility] and he will proceed with cycle #28 today. For the hypothyroidism, he will continue with his current treatment with levothyroxine.  We will continue to monitor his TSH closely. He will come back for follow-up visit in 3 weeks for evaluation before the next cycle of his treatment.  Regarding the pain in the left leg, will consider the patient for repeat CT of the left thigh with the next staging work-up or sooner if he has any more symptoms in that area. The patient was advised to call immediately if he has any concerning  symptoms in the interval.  Disclaimer: This note was dictated with voice recognition software. Similar sounding words can inadvertently be transcribed and may be missed upon review. Eilleen Kempf, MD 01/02/19

## 2019-01-01 ENCOUNTER — Other Ambulatory Visit: Payer: Self-pay

## 2019-01-01 ENCOUNTER — Inpatient Hospital Stay: Payer: Medicare HMO

## 2019-01-01 ENCOUNTER — Ambulatory Visit (HOSPITAL_COMMUNITY)
Admission: RE | Admit: 2019-01-01 | Discharge: 2019-01-01 | Disposition: A | Discharge status: Home / Self Care | Payer: Medicare HMO | Source: Ambulatory Visit | Attending: Physician Assistant | Admitting: Physician Assistant

## 2019-01-01 ENCOUNTER — Encounter (HOSPITAL_COMMUNITY): Payer: Self-pay

## 2019-01-01 ENCOUNTER — Inpatient Hospital Stay (HOSPITAL_BASED_OUTPATIENT_CLINIC_OR_DEPARTMENT_OTHER): Payer: Medicare HMO | Admitting: Physician Assistant

## 2019-01-01 ENCOUNTER — Inpatient Hospital Stay: Payer: Medicare HMO | Attending: Internal Medicine

## 2019-01-01 VITALS — BP 126/86 | HR 61 | Temp 98.7°F | Resp 19 | Ht 73.0 in | Wt 260.1 lb

## 2019-01-01 DIAGNOSIS — C3432 Malignant neoplasm of lower lobe, left bronchus or lung: Secondary | ICD-10-CM | POA: Insufficient documentation

## 2019-01-01 DIAGNOSIS — E039 Hypothyroidism, unspecified: Secondary | ICD-10-CM | POA: Insufficient documentation

## 2019-01-01 DIAGNOSIS — C3492 Malignant neoplasm of unspecified part of left bronchus or lung: Secondary | ICD-10-CM

## 2019-01-01 DIAGNOSIS — Z7901 Long term (current) use of anticoagulants: Secondary | ICD-10-CM | POA: Insufficient documentation

## 2019-01-01 DIAGNOSIS — Z5112 Encounter for antineoplastic immunotherapy: Secondary | ICD-10-CM

## 2019-01-01 DIAGNOSIS — I4819 Other persistent atrial fibrillation: Secondary | ICD-10-CM | POA: Diagnosis not present

## 2019-01-01 DIAGNOSIS — Z923 Personal history of irradiation: Secondary | ICD-10-CM | POA: Insufficient documentation

## 2019-01-01 DIAGNOSIS — Z9221 Personal history of antineoplastic chemotherapy: Secondary | ICD-10-CM | POA: Insufficient documentation

## 2019-01-01 DIAGNOSIS — Z79899 Other long term (current) drug therapy: Secondary | ICD-10-CM | POA: Insufficient documentation

## 2019-01-01 DIAGNOSIS — C7951 Secondary malignant neoplasm of bone: Secondary | ICD-10-CM | POA: Diagnosis not present

## 2019-01-01 DIAGNOSIS — Z902 Acquired absence of lung [part of]: Secondary | ICD-10-CM | POA: Insufficient documentation

## 2019-01-01 DIAGNOSIS — Z95828 Presence of other vascular implants and grafts: Secondary | ICD-10-CM

## 2019-01-01 DIAGNOSIS — C3490 Malignant neoplasm of unspecified part of unspecified bronchus or lung: Secondary | ICD-10-CM | POA: Diagnosis not present

## 2019-01-01 LAB — CBC WITH DIFFERENTIAL (CANCER CENTER ONLY)
Abs Immature Granulocytes: 0.04 10*3/uL (ref 0.00–0.07)
Basophils Absolute: 0 10*3/uL (ref 0.0–0.1)
Basophils Relative: 0 %
Eosinophils Absolute: 0.1 10*3/uL (ref 0.0–0.5)
Eosinophils Relative: 1 %
HCT: 46.7 % (ref 39.0–52.0)
Hemoglobin: 16.2 g/dL (ref 13.0–17.0)
Immature Granulocytes: 0 %
Lymphocytes Relative: 11 %
Lymphs Abs: 1.6 10*3/uL (ref 0.7–4.0)
MCH: 34.5 pg — ABNORMAL HIGH (ref 26.0–34.0)
MCHC: 34.7 g/dL (ref 30.0–36.0)
MCV: 99.6 fL (ref 80.0–100.0)
Monocytes Absolute: 0.9 10*3/uL (ref 0.1–1.0)
Monocytes Relative: 6 %
Neutro Abs: 11.6 10*3/uL — ABNORMAL HIGH (ref 1.7–7.7)
Neutrophils Relative %: 82 %
Platelet Count: 181 10*3/uL (ref 150–400)
RBC: 4.69 MIL/uL (ref 4.22–5.81)
RDW: 14 % (ref 11.5–15.5)
WBC Count: 14.3 10*3/uL — ABNORMAL HIGH (ref 4.0–10.5)
nRBC: 0 % (ref 0.0–0.2)

## 2019-01-01 LAB — CMP (CANCER CENTER ONLY)
ALT: 20 U/L (ref 0–44)
AST: 21 U/L (ref 15–41)
Albumin: 4.2 g/dL (ref 3.5–5.0)
Alkaline Phosphatase: 85 U/L (ref 38–126)
Anion gap: 10 (ref 5–15)
BUN: 16 mg/dL (ref 8–23)
CO2: 25 mmol/L (ref 22–32)
Calcium: 8.9 mg/dL (ref 8.9–10.3)
Chloride: 105 mmol/L (ref 98–111)
Creatinine: 1.04 mg/dL (ref 0.61–1.24)
GFR, Est AFR Am: >60 mL/min (ref >60)
GFR, Estimated: >60 mL/min (ref >60)
Glucose, Bld: 115 mg/dL — ABNORMAL HIGH (ref 70–99)
Potassium: 4.4 mmol/L (ref 3.5–5.1)
Sodium: 140 mmol/L (ref 135–145)
Total Bilirubin: 1 mg/dL (ref 0.3–1.2)
Total Protein: 6.6 g/dL (ref 6.5–8.1)

## 2019-01-01 MED ORDER — SODIUM CHLORIDE 0.9 % IV SOLN
Freq: Once | INTRAVENOUS | Status: AC
  Administered 2019-01-01: 15:00:00 via INTRAVENOUS
  Filled 2019-01-01: qty 250

## 2019-01-01 MED ORDER — SODIUM CHLORIDE (PF) 0.9 % IJ SOLN
INTRAMUSCULAR | Status: AC
  Filled 2019-01-01: qty 50

## 2019-01-01 MED ORDER — SODIUM CHLORIDE 0.9% FLUSH
10.0000 mL | INTRAVENOUS | Status: DC | PRN
  Administered 2019-01-01: 10 mL
  Filled 2019-01-01: qty 10

## 2019-01-01 MED ORDER — SODIUM CHLORIDE 0.9 % IV SOLN
200.0000 mg | Freq: Once | INTRAVENOUS | Status: AC
  Administered 2019-01-01: 200 mg via INTRAVENOUS
  Filled 2019-01-01: qty 8

## 2019-01-01 MED ORDER — IOHEXOL 300 MG/ML  SOLN
100.0000 mL | Freq: Once | INTRAMUSCULAR | Status: AC | PRN
  Administered 2019-01-01: 100 mL via INTRAVENOUS

## 2019-01-01 MED ORDER — HEPARIN SOD (PORK) LOCK FLUSH 100 UNIT/ML IV SOLN
500.0000 [IU] | Freq: Once | INTRAVENOUS | Status: AC
  Administered 2019-01-01: 09:00:00 500 [IU] via INTRAVENOUS

## 2019-01-01 MED ORDER — HEPARIN SOD (PORK) LOCK FLUSH 100 UNIT/ML IV SOLN
500.0000 [IU] | Freq: Once | INTRAVENOUS | Status: AC | PRN
  Administered 2019-01-01: 500 [IU]
  Filled 2019-01-01: qty 5

## 2019-01-01 NOTE — Patient Instructions (Signed)

## 2019-01-02 ENCOUNTER — Encounter: Payer: Self-pay | Admitting: Physician Assistant

## 2019-01-02 LAB — TSH: TSH: 1.05 u[IU]/mL (ref 0.320–4.118)

## 2019-01-05 ENCOUNTER — Other Ambulatory Visit: Payer: Self-pay

## 2019-01-05 ENCOUNTER — Ambulatory Visit (INDEPENDENT_AMBULATORY_CARE_PROVIDER_SITE_OTHER): Payer: Medicare HMO | Admitting: Emergency Medicine

## 2019-01-05 ENCOUNTER — Encounter: Payer: Self-pay | Admitting: Emergency Medicine

## 2019-01-05 VITALS — BP 117/71 | HR 73 | Temp 97.9°F | Resp 16 | Ht 74.0 in | Wt 257.2 lb

## 2019-01-05 DIAGNOSIS — C7951 Secondary malignant neoplasm of bone: Secondary | ICD-10-CM | POA: Diagnosis not present

## 2019-01-05 DIAGNOSIS — Z23 Encounter for immunization: Secondary | ICD-10-CM | POA: Diagnosis not present

## 2019-01-05 DIAGNOSIS — G8929 Other chronic pain: Secondary | ICD-10-CM

## 2019-01-05 DIAGNOSIS — Z8639 Personal history of other endocrine, nutritional and metabolic disease: Secondary | ICD-10-CM | POA: Diagnosis not present

## 2019-01-05 DIAGNOSIS — Z7901 Long term (current) use of anticoagulants: Secondary | ICD-10-CM

## 2019-01-05 DIAGNOSIS — Z7689 Persons encountering health services in other specified circumstances: Secondary | ICD-10-CM

## 2019-01-05 DIAGNOSIS — I482 Chronic atrial fibrillation, unspecified: Secondary | ICD-10-CM

## 2019-01-05 DIAGNOSIS — M549 Dorsalgia, unspecified: Secondary | ICD-10-CM

## 2019-01-05 DIAGNOSIS — Z8659 Personal history of other mental and behavioral disorders: Secondary | ICD-10-CM

## 2019-01-05 DIAGNOSIS — C3490 Malignant neoplasm of unspecified part of unspecified bronchus or lung: Secondary | ICD-10-CM

## 2019-01-05 DIAGNOSIS — E785 Hyperlipidemia, unspecified: Secondary | ICD-10-CM

## 2019-01-05 DIAGNOSIS — C3492 Malignant neoplasm of unspecified part of left bronchus or lung: Secondary | ICD-10-CM

## 2019-01-05 NOTE — Patient Instructions (Addendum)
   If you have lab work done today you will be contacted with your lab results within the next 2 weeks.  If you have not heard from us then please contact us. The fastest way to get your results is to register for My Chart.   IF you received an x-ray today, you will receive an invoice from [facility]. Please contact [facility] at [phone] with questions or concerns regarding your invoice.   IF you received labwork today, you will receive an invoice from [facility]. Please contact [facility] at [phone] with questions or concerns regarding your invoice.   Our billing staff will not be able to assist you with questions regarding bills from these companies.  You will be contacted with the lab results as soon as they are available. The fastest way to get your results is to activate your My Chart account. Instructions are located on the last page of this paperwork. If you have not heard from us regarding the results in 2 weeks, please contact this office.     Health Maintenance After Age 65 After age 65, you are at a higher risk for certain long-term diseases and infections as well as injuries from falls. Falls are a major cause of broken bones and head injuries in people who are older than age 65. Getting regular preventive care can help to keep you healthy and well. Preventive care includes getting regular testing and making lifestyle changes as recommended by your health care provider. Talk with your health care provider about:  Which screenings and tests you should have. A screening is a test that checks for a disease when you have no symptoms.  A diet and exercise plan that is right for you. What should I know about screenings and tests to prevent falls? Screening and testing are the best ways to find a health problem early. Early diagnosis and treatment give you the best chance of managing medical conditions that are common after age 65. Certain conditions and  lifestyle choices may make you more likely to have a fall. Your health care provider may recommend:  Regular vision checks. Poor vision and conditions such as cataracts can make you more likely to have a fall. If you wear glasses, make sure to get your prescription updated if your vision changes.  Medicine review. Work with your health care provider to regularly review all of the medicines you are taking, including over-the-counter medicines. Ask your health care provider about any side effects that may make you more likely to have a fall. Tell your health care provider if any medicines that you take make you feel dizzy or sleepy.  Osteoporosis screening. Osteoporosis is a condition that causes the bones to get weaker. This can make the bones weak and cause them to break more easily.  Blood pressure screening. Blood pressure changes and medicines to control blood pressure can make you feel dizzy.  Strength and balance checks. Your health care provider may recommend certain tests to check your strength and balance while standing, walking, or changing positions.  Foot health exam. Foot pain and numbness, as well as not wearing proper footwear, can make you more likely to have a fall.  Depression screening. You may be more likely to have a fall if you have a fear of falling, feel emotionally low, or feel unable to do activities that you used to do.  Alcohol use screening. Using too much alcohol can affect your balance and may make you more likely to   have a fall. What actions can I take to lower my risk of falls? General instructions  Talk with your health care provider about your risks for falling. Tell your health care provider if: ? You fall. Be sure to tell your health care provider about all falls, even ones that seem minor. ? You feel dizzy, sleepy, or off-balance.  Take over-the-counter and prescription medicines only as told by your health care provider. These include any  supplements.  Eat a healthy diet and maintain a healthy weight. A healthy diet includes low-fat dairy products, low-fat (lean) meats, and fiber from whole grains, beans, and lots of fruits and vegetables. Home safety  Remove any tripping hazards, such as rugs, cords, and clutter.  Install safety equipment such as grab bars in bathrooms and safety rails on stairs.  Keep rooms and walkways well-lit. Activity   Follow a regular exercise program to stay fit. This will help you maintain your balance. Ask your health care provider what types of exercise are appropriate for you.  If you need a cane or walker, use it as recommended by your health care provider.  Wear supportive shoes that have nonskid soles. Lifestyle  Do not drink alcohol if your health care provider tells you not to drink.  If you drink alcohol, limit how much you have: ? 0-1 drink a day for women. ? 0-2 drinks a day for men.  Be aware of how much alcohol is in your drink. In the [place] one drink equals one typical bottle of beer (12 oz), one-half glass of wine (5 oz), or one shot of hard liquor (1 oz).  Do not use any products that contain nicotine or tobacco, such as cigarettes and e-cigarettes. If you need help quitting, ask your health care provider. Summary  Having a healthy lifestyle and getting preventive care can help to protect your health and wellness after age 65.  Screening and testing are the best way to find a health problem early and help you avoid having a fall. Early diagnosis and treatment give you the best chance for managing medical conditions that are more common for people who are older than age 65.  Falls are a major cause of broken bones and head injuries in people who are older than age 65. Take precautions to prevent a fall at home.  Work with your health care provider to learn what changes you can make to improve your health and wellness and to prevent falls. This information is not intended  to replace advice given to you by your health care provider. Make sure you discuss any questions you have with your health care provider. Document Released: 01/30/2017 Document Revised: 07/10/2018 Document Reviewed: 01/30/2017 Elsevier Patient Education  2020 Elsevier Inc.  

## 2019-01-05 NOTE — Progress Notes (Addendum)
Victor Huber 68 y.o.   Chief Complaint  Patient presents with   Establish Care   Medication Refill    PEND    HISTORY OF PRESENT ILLNESS: This is a 68 y.o. male first visit to this office, here to establish care.  Used to see Dr. [name] but states he does not want to see him anymore, "he needs to retire".  Looking for a different primary care doctor.  Also very critical of my CMA. Has chronic multiple medical problems including the following: #1  Lung cancer: On chemotherapy: Dr. Earlie Server oncologist hematologist. 2.  Chronic atrial fibrillation: On Eliquis 5 mg twice a day and atenolol 50 mg daily. 3.  Dyslipidemia: On Lipitor 40 mg daily. 4.  Depression: On Celexa 40 mg daily. 5.  Hypothyroidism: On Synthroid 150 mcg daily. 6.  LUTS: On Flomax 0.4 mg. 7.  Chronic back pain: On oxycodone 10 mg as needed.  Medication Refill     Prior to Admission medications   Medication Sig Start Date End Date Taking? Authorizing Provider  Albuterol Sulfate (VENTOLIN HFA IN) Inhale into the lungs as needed.   Yes [provider]  apixaban (ELIQUIS) 5 MG TABS tablet Take 1 tablet (5 mg total) by mouth 2 (two) times daily. 01/02/18  Yes Skeet Latch, MD  atenolol (TENORMIN) 50 MG tablet Take 1 tablet (50 mg total) by mouth daily. 01/02/18  Yes Skeet Latch, MD  atorvastatin (LIPITOR) 40 MG tablet Take 40 mg by mouth daily.   Yes [provider]  citalopram (CELEXA) 40 MG tablet Take 40 mg by mouth daily.   Yes [provider]  ibuprofen (ADVIL,MOTRIN) 400 MG tablet Take 400 mg by mouth every 4 (four) hours as needed for moderate pain.   Yes [provider]  levothyroxine (SYNTHROID, LEVOTHROID) 150 MCG tablet Take 150 mcg by mouth daily. 01/22/18  Yes [provider]  Oxycodone HCl 10 MG TABS Take 10 mg by mouth as needed.   Yes [provider]  tamsulosin (FLOMAX) 0.4 MG CAPS capsule Take 0.4 mg daily by mouth.    Yes  [provider]    Allergies  Allergen Reactions   Carboplatin Itching, Nausea And Vomiting and Other (See Comments)    Flushing   Flecainide Hypertension    CAUSED HEART ISSUES     Patient Active Problem List   Diagnosis Date Noted   Port-A-Cath in place 08/22/2017   Encounter for antineoplastic chemotherapy 03/28/2017   Encounter for antineoplastic immunotherapy 03/28/2017   Adenocarcinoma of left lung, stage 4 ([place]) 02/26/2017   Metastasis to bone ([place]) 02/19/2017   Bone metastasis ([place]), left femur 01/24/2017   Centrilobular emphysema ([place]) 09/26/2016   Hyperlipidemia 09/26/2016   Anxiety disorder 09/26/2016   Adenocarcinoma of left lung, stage 3 ([place]) 08/15/2016   COPD (chronic obstructive pulmonary disease) ([place]) 08/15/2016   S/P TURP 08/15/2016   Hypothyroid 08/15/2016    Past Medical History:  Diagnosis Date   Adenocarcinoma of left lung, stage 3 ([place]) 08/15/2016   Arthritis    Atrial fibrillation ([place]) 08/15/2016   Atrial fibrillation ([facility])    COPD (chronic obstructive pulmonary disease) ([place]) 08/15/2016   History of chemotherapy    History of radiation therapy    Hyperlipidemia    Hypothyroid 08/15/2016   Longstanding persistent atrial fibrillation ([place]) 08/29/2016   Pathologic fracture    left femur   Pneumonitis    S/P TURP 08/15/2016   Wears glasses    Wears hearing  aid in both ears     Past Surgical History:  Procedure Laterality Date   BRONCHOSCOPY  10/2014   CARDIAC CATHETERIZATION     05/07/12   CARDIOVERSION     x2   COLONOSCOPY     DG BIOPSY LUNG Left 10/2014   FNA - Adenocarcinoma    FEMUR IM NAIL Left 02/19/2017   FEMUR IM NAIL Left 02/19/2017   Procedure: INTRAMEDULLARY (IM) NAIL FEMORAL;  Surgeon: Marchia Bond, MD;  Location: [facility];  Service: Orthopedics;  Laterality: Left;   IR FLUORO GUIDE PORT INSERTION RIGHT  07/03/2017   IR US GUIDE VASC ACCESS RIGHT  07/03/2017   LUNG CANCER SURGERY Left  12/2014   Wedge Resection    MULTIPLE TOOTH EXTRACTIONS     Status post TURP     TONSILLECTOMY      Social History   Socioeconomic History   Marital status: Married    Spouse name: Not on file   Number of children: Not on file   Years of education: Not on file   Highest education level: Not on file  Occupational History   Not on file  Social Needs   Financial resource strain: Not on file   Food insecurity    Worry: Not on file    Inability: Not on file   Transportation needs    Medical: Not on file    Non-medical: Not on file  Tobacco Use   Smoking status: Former Smoker    Packs/day: 1.00    Years: 50.00    Pack years: 50.00    Types: Cigarettes    Quit date: 08/15/2012    Years since quitting: 6.3   Smokeless tobacco: Never Used  Substance and Sexual Activity   Alcohol use: No   Drug use: No   Sexual activity: Not on file  Lifestyle   Physical activity    Days per week: Not on file    Minutes per session: Not on file   Stress: Not on file  Relationships   Social connections    Talks on phone: Not on file    Gets together: Not on file    Attends religious service: Not on file    Active member of club or organization: Not on file    Attends meetings of clubs or organizations: Not on file    Relationship status: Not on file   Intimate partner violence    Fear of current or ex partner: Not on file    Emotionally abused: Not on file    Physically abused: Not on file    Forced sexual activity: Not on file  Other Topics Concern   Not on file  Social History Narrative   [facility] (09/26/16):   Originally from [place]. Moved to [facility] February 2018. Always lived in [place]. Moved to be closer to children & grandchildren. No international travel. 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 No mold exposure. No bird exposure or hot tub exposure. Enjoys reading. Previously enjoyed  wood working with domestic woods.     Family History  Problem Relation Age of Onset   Breast cancer Mother    Breast cancer Maternal Aunt    Breast cancer Maternal Aunt    Lung disease Neg Hx      ROS  Vitals:   01/05/19 1331  BP: 117/71  Pulse: 73  Resp: 16  Temp: 97.9 F (36.6 C)  SpO2: 93%  Physical Exam Constitutional:      Appearance: Normal appearance.  Pulmonary:     Effort: Pulmonary effort is normal.  Neurological:     Mental Status: He is alert and oriented to person, place, and time.      ASSESSMENT & PLAN: After review of medical history and brief interview it is my belief that it is in Mr. Pfeifer best interest to follow-up with a different internal medicine group. I do not accept to be his primary care physician. I will place an Internal Medicine referral today.  Haidan was seen today for establish care and medication refill.  Diagnoses and all orders for this visit:  Adenocarcinoma of left lung, stage 3 ([place]) -     Ambulatory referral to Internal Medicine  Malignant neoplasm of unspecified part of unspecified bronchus or lung ([place])  Need for prophylactic vaccination and inoculation against influenza -     Cancel: Flu Vaccine QUAD High Dose([place])  Metastasis to bone ([facility])  Hyperlipidemia, unspecified hyperlipidemia type  Chronic atrial fibrillation ([place])  Current use of long term anticoagulation  History of depression  History of hypothyroidism  Encounter to establish care  Chronic bilateral back pain, unspecified back location     Patient Instructions       If you have lab work done today you will be contacted with your lab results within the next 2 weeks.  If you have not heard from [place] then please contact us. The fastest way to get your results is to register for My Chart.   IF you received an x-ray today, you will receive an invoice from [facility]. Please contact [facility] at [phone]  with questions or concerns regarding your invoice.   IF you received labwork today, you will receive an invoice from [facility]. Please contact [facility] at [phone] with questions or concerns regarding your invoice.   Our billing staff will not be able to assist you with questions regarding bills from these companies.  You will be contacted with the lab results as soon as they are available. The fastest way to get your results is to activate your My Chart account. Instructions are located on the last page of this paperwork. If you have not heard from [place] regarding the results in 2 weeks, please contact this office.     Health Maintenance After Age 23 After age 93, you are at a higher risk for certain long-term diseases and infections as well as injuries from falls. Falls are a major cause of broken bones and head injuries in people who are older than age 70. Getting regular preventive care can help to keep you healthy and well. Preventive care includes getting regular testing and making lifestyle changes as recommended by your health care provider. Talk with your health care provider about:  Which screenings and tests you should have. A screening is a test that checks for a disease when you have no symptoms.  A diet and exercise plan that is right for you. What should I know about screenings and tests to prevent falls? Screening and testing are the best ways to find a health problem early. Early diagnosis and treatment give you the best chance of managing medical conditions that are common after age 33. Certain conditions and lifestyle choices may make you more likely to have a fall. Your health care provider may recommend:  Regular vision checks. Poor vision and conditions such as cataracts can make you more likely to have a fall. If you wear glasses,  make sure to get your prescription updated if your vision changes.  Medicine review. Work with your health care provider to regularly review all  of the medicines you are taking, including over-the-counter medicines. Ask your health care provider about any side effects that may make you more likely to have a fall. Tell your health care provider if any medicines that you take make you feel dizzy or sleepy.  Osteoporosis screening. Osteoporosis is a condition that causes the bones to get weaker. This can make the bones weak and cause them to break more easily.  Blood pressure screening. Blood pressure changes and medicines to control blood pressure can make you feel dizzy.  Strength and balance checks. Your health care provider may recommend certain tests to check your strength and balance while standing, walking, or changing positions.  Foot health exam. Foot pain and numbness, as well as not wearing proper footwear, can make you more likely to have a fall.  Depression screening. You may be more likely to have a fall if you have a fear of falling, feel emotionally low, or feel unable to do activities that you used to do.  Alcohol use screening. Using too much alcohol can affect your balance and may make you more likely to have a fall. What actions can I take to lower my risk of falls? General instructions  Talk with your health care provider about your risks for falling. Tell your health care provider if: ? You fall. Be sure to tell your health care provider about all falls, even ones that seem minor. ? You feel dizzy, sleepy, or off-balance.  Take over-the-counter and prescription medicines only as told by your health care provider. These include any supplements.  Eat a healthy diet and maintain a healthy weight. A healthy diet includes low-fat dairy products, low-fat (lean) meats, and fiber from whole grains, beans, and lots of fruits and vegetables. Home safety  Remove any tripping hazards, such as rugs, cords, and clutter.  Install safety equipment such as grab bars in bathrooms and safety rails on stairs.  Keep rooms and  walkways well-lit. Activity   Follow a regular exercise program to stay fit. This will help you maintain your balance. Ask your health care provider what types of exercise are appropriate for you.  If you need a cane or walker, use it as recommended by your health care provider.  Wear supportive shoes that have nonskid soles. Lifestyle  Do not drink alcohol if your health care provider tells you not to drink.  If you drink alcohol, limit how much you have: ? 0-1 drink a day for women. ? 0-2 drinks a day for men.  Be aware of how much alcohol is in your drink. In the [place] one drink equals one typical bottle of beer (12 oz), one-half glass of wine (5 oz), or one shot of hard liquor (1 oz).  Do not use any products that contain nicotine or tobacco, such as cigarettes and e-cigarettes. If you need help quitting, ask your health care provider. Summary  Having a healthy lifestyle and getting preventive care can help to protect your health and wellness after age 45.  Screening and testing are the best way to find a health problem early and help you avoid having a fall. Early diagnosis and treatment give you the best chance for managing medical conditions that are more common for people who are older than age 90.  Falls are a major cause of broken bones and head injuries  in people who are older than age 13. Take precautions to prevent a fall at home.  Work with your health care provider to learn what changes you can make to improve your health and wellness and to prevent falls. This information is not intended to replace advice given to you by your health care provider. Make sure you discuss any questions you have with your health care provider. Document Released: 01/30/2017 Document Revised: 07/10/2018 Document Reviewed: 01/30/2017 Elsevier Patient Education  2020 Elsevier Inc.    Agustina Caroli, MD [facility]

## 2019-01-07 ENCOUNTER — Encounter: Payer: Self-pay | Admitting: Internal Medicine

## 2019-01-09 ENCOUNTER — Telehealth: Payer: Self-pay | Admitting: Family Medicine

## 2019-01-09 NOTE — Telephone Encounter (Signed)
Pt has referral for internal medicine, please verify this is correct.

## 2019-01-13 NOTE — Telephone Encounter (Signed)
Yes it is.  I will not be his primary care provider/internal medicine doctor.  Thanks.

## 2019-01-22 ENCOUNTER — Inpatient Hospital Stay: Payer: Medicare HMO

## 2019-01-22 ENCOUNTER — Telehealth: Payer: Self-pay | Admitting: Physician Assistant

## 2019-01-22 ENCOUNTER — Other Ambulatory Visit: Payer: Self-pay

## 2019-01-22 ENCOUNTER — Inpatient Hospital Stay (HOSPITAL_BASED_OUTPATIENT_CLINIC_OR_DEPARTMENT_OTHER): Payer: Medicare HMO | Admitting: Physician Assistant

## 2019-01-22 ENCOUNTER — Encounter: Payer: Self-pay | Admitting: Physician Assistant

## 2019-01-22 VITALS — BP 121/80 | HR 87 | Temp 98.5°F | Resp 18 | Ht 74.0 in | Wt 263.0 lb

## 2019-01-22 DIAGNOSIS — C3432 Malignant neoplasm of lower lobe, left bronchus or lung: Secondary | ICD-10-CM | POA: Diagnosis not present

## 2019-01-22 DIAGNOSIS — C3492 Malignant neoplasm of unspecified part of left bronchus or lung: Secondary | ICD-10-CM

## 2019-01-22 DIAGNOSIS — Z7901 Long term (current) use of anticoagulants: Secondary | ICD-10-CM | POA: Diagnosis not present

## 2019-01-22 DIAGNOSIS — Z923 Personal history of irradiation: Secondary | ICD-10-CM | POA: Diagnosis not present

## 2019-01-22 DIAGNOSIS — E039 Hypothyroidism, unspecified: Secondary | ICD-10-CM | POA: Diagnosis not present

## 2019-01-22 DIAGNOSIS — C7951 Secondary malignant neoplasm of bone: Secondary | ICD-10-CM | POA: Diagnosis not present

## 2019-01-22 DIAGNOSIS — Z9221 Personal history of antineoplastic chemotherapy: Secondary | ICD-10-CM | POA: Diagnosis not present

## 2019-01-22 DIAGNOSIS — Z95828 Presence of other vascular implants and grafts: Secondary | ICD-10-CM

## 2019-01-22 DIAGNOSIS — Z5112 Encounter for antineoplastic immunotherapy: Secondary | ICD-10-CM | POA: Diagnosis not present

## 2019-01-22 DIAGNOSIS — Z902 Acquired absence of lung [part of]: Secondary | ICD-10-CM | POA: Diagnosis not present

## 2019-01-22 DIAGNOSIS — I4819 Other persistent atrial fibrillation: Secondary | ICD-10-CM | POA: Diagnosis not present

## 2019-01-22 LAB — CBC WITH DIFFERENTIAL (CANCER CENTER ONLY)
Abs Immature Granulocytes: 0.03 10*3/uL (ref 0.00–0.07)
Basophils Absolute: 0 10*3/uL (ref 0.0–0.1)
Basophils Relative: 0 %
Eosinophils Absolute: 0.1 10*3/uL (ref 0.0–0.5)
Eosinophils Relative: 2 %
HCT: 44.5 % (ref 39.0–52.0)
Hemoglobin: 15.9 g/dL (ref 13.0–17.0)
Immature Granulocytes: 1 %
Lymphocytes Relative: 21 %
Lymphs Abs: 1.3 10*3/uL (ref 0.7–4.0)
MCH: 35.2 pg — ABNORMAL HIGH (ref 26.0–34.0)
MCHC: 35.7 g/dL (ref 30.0–36.0)
MCV: 98.5 fL (ref 80.0–100.0)
Monocytes Absolute: 0.4 10*3/uL (ref 0.1–1.0)
Monocytes Relative: 6 %
Neutro Abs: 4.3 10*3/uL (ref 1.7–7.7)
Neutrophils Relative %: 70 %
Platelet Count: 154 10*3/uL (ref 150–400)
RBC: 4.52 MIL/uL (ref 4.22–5.81)
RDW: 13.2 % (ref 11.5–15.5)
WBC Count: 6.1 10*3/uL (ref 4.0–10.5)
nRBC: 0 % (ref 0.0–0.2)

## 2019-01-22 LAB — TSH: TSH: 6.278 u[IU]/mL — ABNORMAL HIGH (ref 0.320–4.118)

## 2019-01-22 LAB — CMP (CANCER CENTER ONLY)
ALT: 20 U/L (ref 0–44)
AST: 19 U/L (ref 15–41)
Albumin: 3.9 g/dL (ref 3.5–5.0)
Alkaline Phosphatase: 86 U/L (ref 38–126)
Anion gap: 11 (ref 5–15)
BUN: 17 mg/dL (ref 8–23)
CO2: 24 mmol/L (ref 22–32)
Calcium: 8.9 mg/dL (ref 8.9–10.3)
Chloride: 106 mmol/L (ref 98–111)
Creatinine: 1.11 mg/dL (ref 0.61–1.24)
GFR, Est AFR Am: >60 mL/min (ref >60)
GFR, Estimated: >60 mL/min (ref >60)
Glucose, Bld: 177 mg/dL — ABNORMAL HIGH (ref 70–99)
Potassium: 4.2 mmol/L (ref 3.5–5.1)
Sodium: 141 mmol/L (ref 135–145)
Total Bilirubin: 0.9 mg/dL (ref 0.3–1.2)
Total Protein: 6.2 g/dL — ABNORMAL LOW (ref 6.5–8.1)

## 2019-01-22 MED ORDER — SODIUM CHLORIDE 0.9 % IV SOLN
200.0000 mg | Freq: Once | INTRAVENOUS | Status: AC
  Administered 2019-01-22: 200 mg via INTRAVENOUS
  Filled 2019-01-22: qty 8

## 2019-01-22 MED ORDER — SODIUM CHLORIDE 0.9 % IV SOLN
Freq: Once | INTRAVENOUS | Status: AC
  Administered 2019-01-22: 12:00:00 via INTRAVENOUS
  Filled 2019-01-22: qty 250

## 2019-01-22 MED ORDER — HEPARIN SOD (PORK) LOCK FLUSH 100 UNIT/ML IV SOLN
500.0000 [IU] | Freq: Once | INTRAVENOUS | Status: AC | PRN
  Administered 2019-01-22: 500 [IU]
  Filled 2019-01-22: qty 5

## 2019-01-22 MED ORDER — SODIUM CHLORIDE 0.9% FLUSH
10.0000 mL | INTRAVENOUS | Status: DC | PRN
  Administered 2019-01-22: 10 mL
  Filled 2019-01-22: qty 10

## 2019-01-22 NOTE — Telephone Encounter (Signed)
Per 10/22 los pt next treatment was already scheduled.  Scheduled an additional appt per the treatment plan

## 2019-01-22 NOTE — Progress Notes (Signed)
[name] OFFICE PROGRESS NOTE  Victor Huber, Victor Huber [address] [place] [address]  DIAGNOSIS: Metastatic non-small cell lung cancer initially diagnosed as stage IIIA (T2a, N2, M0) non-small cell lung cancer, poorly differentiated adenocarcinoma presented with left lower lobe lung mass in addition to mediastinal lymphadenopathy. The patient was diagnosed with metastatic disease involving the left femur as well as left supraclavicular nodal metastases and right paratracheal lymphadenopathy in October 2018.  Biomarker Findings Microsatellite Status - MS-Stable Tumor Mutational Burden - TMB-Low (3 Muts/Mb) Genomic Findings For a complete list of the genes assayed, please refer to the Appendix. STK11 P267f*6 CRZNB5ApP01IDCsplice site 1301+3H>YDAXX E374* MLL2 [id number] NBN K23381f NOTCH2 [id number] splice site [id number]+2Q>ADisease relevant genes with no reportable alterations: EGFR, KRAS, ALK, BRAF, MET, RET, ERBB2, ROS1   PDL1 expression5%  PRIOR THERAPY:  1) status post wedge resection of the left lower lobe lung mass as well as AP window lymph node dissection but there was residual metastatic mediastinal lymphadenopathy that could not be resected. 2) a course of concurrent chemoradiation [place] carboplatin [place]  3) status post palliative radiotherapy to the left femur metastatic bone disease. 4) Systemic chemotherapy with carboplatin for AUC of 5, Alimta 500 mg/M2 and Keytruda 200 mg IV every 3 weeks. First dose March 07, 2017. Carboplatin was discontinued during cycle #2 secondary to hypersensitivity reaction. 5) status post 2 cycles of maintenance treatment with Alimta and Ketruda (pembrolizumab). Alimta was discontinued secondary to intolerance  CURRENT THERAPY: Maintenance treatment with single agent KetNat Mathembrolizumab) status post 28cycles  INTERVAL HISTORY: MicURAL ACREE 24o.  male returns to the clinic for a follow up visit. He does not have any concerning complaints today. He is tolerating his treatment well without any adverse effects except for a mild rash on his cheeks/eyebrows and external auditory canal. He uses hydrocortisone cream for his rash and is prescribed drops for his ears by his ENT. He reports his baseline shortness of breath which is worse based on position. He states that he notices his shortness of breath when he bends forward. He denies chest pain or hemoptysis. He denies nausea, vomiting, diarrhea, or constipating. He denies any visual changes or unusual headaches. He has unchanged left femur pain that is noticeable when he is walking up multiple steps. He denies traumas or injuries. He is here today for evaluation before starting cycle #29 today.   MEDICAL HISTORY: Past Medical History:  Diagnosis Date  . Adenocarcinoma of left lung, stage 3 ([facility]/16/2018  . Arthritis   . Atrial fibrillation ([facility]/16/2018  . Atrial fibrillation ([facility] . COPD (chronic obstructive pulmonary disease) ([facility]/16/2018  . History of chemotherapy   . History of radiation therapy   . Hyperlipidemia   . Hypothyroid 08/15/2016  . Longstanding persistent atrial fibrillation ([facility]/30/2018  . Pathologic fracture    left femur  . Pneumonitis   . S/P TURP 08/15/2016  . Wears glasses   . Wears hearing aid in both ears     ALLERGIES:  is allergic to carboplatin and flecainide.  MEDICATIONS:  Current Outpatient Medications  Medication Sig Dispense Refill  . Albuterol Sulfate (VENTOLIN HFA IN) Inhale into the lungs as needed.    . aMarland Kitchenixaban (ELIQUIS) 5 MG TABS tablet Take 1 tablet (5 mg total) by mouth 2 (two) times daily. 180 tablet 3  . atenolol (TENORMIN) 50 MG tablet Take 1 tablet (50 mg total)  by mouth daily. 90 tablet 3  . atorvastatin (LIPITOR) 40 MG tablet Take 40 mg by mouth daily.    . citalopram (CELEXA) 40 MG tablet Take 40 mg by mouth daily.    Marland Kitchen  ibuprofen (ADVIL,MOTRIN) 400 MG tablet Take 400 mg by mouth every 4 (four) hours as needed for moderate pain.    Marland Kitchen levothyroxine (SYNTHROID, LEVOTHROID) 150 MCG tablet Take 150 mcg by mouth daily.    . Oxycodone HCl 10 MG TABS Take 10 mg by mouth as needed.    . tamsulosin (FLOMAX) 0.4 MG CAPS capsule Take 0.4 mg daily by mouth.      No current facility-administered medications for this visit.    Facility-Administered Medications Ordered in Other Visits  Medication Dose Route Frequency Provider Last Rate Last Dose  . heparin lock flush 100 unit/mL  500 Units Intracatheter Once PRN Victor Bears, Victor Huber      . pembrolizumab [facility]) 200 mg in sodium chloride 0.9 % 50 mL chemo infusion  200 mg Intravenous Once Victor Bears, Victor Huber 116 mL/hr at 01/22/19 1226 200 mg at 01/22/19 1226  . sodium chloride flush (NS) 0.9 % injection 10 mL  10 mL Intracatheter PRN Victor Bears, Victor Huber        SURGICAL HISTORY:  Past Surgical History:  Procedure Laterality Date  . BRONCHOSCOPY  10/2014  . CARDIAC CATHETERIZATION     05/07/12  . CARDIOVERSION     x2  . COLONOSCOPY    . DG BIOPSY LUNG Left 10/2014   FNA - Adenocarcinoma   . FEMUR IM NAIL Left 02/19/2017  . FEMUR IM NAIL Left 02/19/2017   Procedure: INTRAMEDULLARY (IM) NAIL FEMORAL;  Surgeon: Victor Bond, Victor Huber;  Location: [facility];  Service: Orthopedics;  Laterality: Left;  . IR FLUORO GUIDE PORT INSERTION RIGHT  07/03/2017  . IR US GUIDE VASC ACCESS RIGHT  07/03/2017  . LUNG CANCER SURGERY Left 12/2014   Wedge Resection   . MULTIPLE TOOTH EXTRACTIONS    . Status post TURP    . TONSILLECTOMY      REVIEW OF SYSTEMS:   Review of Systems  Constitutional: Negative for appetite change, chills, fatigue, fever and unexpected weight change.  HENT: Negative for mouth sores, nosebleeds, sore throat and trouble swallowing.   Eyes: Negative for eye problems and icterus.  Respiratory: Positive for baseline shortness of breath and cough. Negative for  hemoptysis and wheezing.   Cardiovascular: Negative for chest pain and leg swelling.  Gastrointestinal: Negative for abdominal pain, constipation, diarrhea, nausea and vomiting.  Genitourinary: Negative for bladder incontinence, difficulty urinating, dysuria, frequency and hematuria.   Musculoskeletal: Positive for left femur pain with walking up steps. Negative for back pain, gait problem, neck pain and neck stiffness.  Skin: Positive for occasional mild rash on his eyebrows.   Neurological: Negative for dizziness, extremity weakness, gait problem, headaches, light-headedness and seizures.  Hematological: Negative for adenopathy. Does not bruise/bleed easily.  Psychiatric/Behavioral: Negative for confusion, depression and sleep disturbance. The patient is not nervous/anxious.     PHYSICAL EXAMINATION:  Blood pressure 121/80, pulse 87, temperature 98.5 F (36.9 C), temperature source Oral, resp. rate 18, height 6' 2"  (1.88 m), weight 263 lb (119.3 kg), SpO2 95 %.  ECOG PERFORMANCE STATUS: 1 - Symptomatic but completely ambulatory  Physical Exam  Constitutional: Oriented to person, place, and time and well-developed, well-nourished, and in no distress.  HENT:  Head: Normocephalic and atraumatic.  Mouth/Throat: Oropharynx is clear and moist. No oropharyngeal exudate.  Eyes:  Conjunctivae are normal. Right eye exhibits no discharge. Left eye exhibits no discharge. No scleral icterus.  Neck: Normal range of motion. Neck supple.  Cardiovascular: Normal rate, irregular rhythm, normal heart sounds and intact distal pulses.   Pulmonary/Chest: Effort normal and breath sounds normal. No respiratory distress. No wheezes. No rales.  Abdominal: Soft. Bowel sounds are normal. Exhibits no distension and no mass. There is no tenderness.  Musculoskeletal: Normal range of motion. Exhibits no edema. No tenderness to palpation of the left knee/femur.  Lymphadenopathy:    No cervical adenopathy.   Neurological: Alert and oriented to person, place, and time. Exhibits normal muscle tone. Gait normal. Coordination normal.  Skin: Skin is warm and dry. No rash noted. Not diaphoretic. No erythema. No pallor.  Psychiatric: Mood, memory and judgment normal.  Vitals reviewed.  LABORATORY DATA: Lab Results  Component Value Date   WBC 6.1 01/22/2019   HGB 15.9 01/22/2019   HCT 44.5 01/22/2019   MCV 98.5 01/22/2019   PLT 154 01/22/2019      Chemistry      Component Value Date/Time   NA 141 01/22/2019 1025   NA 136 04/04/2017 1141   K 4.2 01/22/2019 1025   K 4.4 04/04/2017 1141   CL 106 01/22/2019 1025   CO2 24 01/22/2019 1025   CO2 24 04/04/2017 1141   BUN 17 01/22/2019 1025   BUN 21.1 04/04/2017 1141   CREATININE 1.11 01/22/2019 1025   CREATININE 1.1 04/04/2017 1141      Component Value Date/Time   CALCIUM 8.9 01/22/2019 1025   CALCIUM 9.1 04/04/2017 1141   ALKPHOS 86 01/22/2019 1025   ALKPHOS 89 04/04/2017 1141   AST 19 01/22/2019 1025   AST 17 04/04/2017 1141   ALT 20 01/22/2019 1025   ALT 18 04/04/2017 1141   BILITOT 0.9 01/22/2019 1025   BILITOT 0.67 04/04/2017 1141       RADIOGRAPHIC STUDIES:  Ct Chest W Contrast  Result Date: 01/01/2019 CLINICAL DATA:  Restaging of metastatic lung cancer. EXAM: CT CHEST, ABDOMEN, AND PELVIS WITH CONTRAST TECHNIQUE: Multidetector CT imaging of the chest, abdomen and pelvis was performed following the standard protocol during bolus administration of intravenous contrast. CONTRAST:  160m OMNIPAQUE IOHEXOL 300 MG/ML  SOLN COMPARISON:  Multiple exams, including 09/18/2018 FINDINGS: CT CHEST FINDINGS Cardiovascular: Right Port-A-Cath tip: Right atrium. Coronary, aortic arch, and branch vessel atherosclerotic vascular disease. Mild cardiomegaly, with particular enlargement the right heart parotid. Mild calcification of the aortic valve leaflets. Stable fluid in the superior pericardial recess anteriorly. Mediastinum/Nodes: Right  paratracheal node 1.3 cm in short axis on image 18/2, stable by my measurements. Additional smaller paratracheal nodes are likewise stable. Subcarinal node 1.8 cm in short axis, image 33/2, stable. Borderline esophageal wall thickening in the mid esophagus, stable. Lungs/Pleura: Stable trace left pleural fluid. Stable left infrahilar airspace opacity with air bronchograms and postoperative findings. Some of the findings may be from prior radiation therapy. Stable emphysema and peripheral interstitial accentuation in both lungs Musculoskeletal: Nonunion at the site of the left seventh rib osteotomy. Thoracic spondylosis. CT ABDOMEN PELVIS FINDINGS Hepatobiliary: Stable 4 mm hypodense lesion in segment 2 of the liver, image 50/2. Numerous small gallstones in the gallbladder. No biliary dilatation. Pancreas: Unremarkable Spleen: Unremarkable Adrenals/Urinary Tract: Stable small renal cysts. Adrenal glands normal. Stomach/Bowel: Unremarkable Vascular/Lymphatic: Aortoiliac atherosclerotic vascular disease. A 1.0 cm right gastric lymph node on image 58/2 previously measured 0.7 cm. Reproductive: Mild prostatomegaly with stable accentuated density along the seminal vesicles. Other:  No supplemental non-categorized findings. Musculoskeletal: Left hip screw. Interbody fusion at L5-S1. Multilevel lumbar impingement due to spurring. IMPRESSION: 1. Stable mild mediastinal adenopathy. A right gastric node is currently borderline prominent at 1.0 cm in diameter, previously 0.7 cm. 2. Stable postoperative and post radiation therapy findings in the left infrahilar region. 3. No compelling findings of progressive malignancy. 4. Other imaging findings of potential clinical significance: Aortic Atherosclerosis (ICD10-I70.0). Coronary atherosclerosis. Mild cardiomegaly. Mild aortic valve calcification. Possible mild esophagitis given wall thickening in the mid esophagus, stable. Cholelithiasis. Mild prostatomegaly. Multilevel lumbar  impingement. Electronically Signed   By: Victor Clines M.D.   On: 01/01/2019 11:08   Ct Abdomen Pelvis W Contrast  Result Date: 01/01/2019 CLINICAL DATA:  Restaging of metastatic lung cancer. EXAM: CT CHEST, ABDOMEN, AND PELVIS WITH CONTRAST TECHNIQUE: Multidetector CT imaging of the chest, abdomen and pelvis was performed following the standard protocol during bolus administration of intravenous contrast. CONTRAST:  166m OMNIPAQUE IOHEXOL 300 MG/ML  SOLN COMPARISON:  Multiple exams, including 09/18/2018 FINDINGS: CT CHEST FINDINGS Cardiovascular: Right Port-A-Cath tip: Right atrium. Coronary, aortic arch, and branch vessel atherosclerotic vascular disease. Mild cardiomegaly, with particular enlargement the right heart parotid. Mild calcification of the aortic valve leaflets. Stable fluid in the superior pericardial recess anteriorly. Mediastinum/Nodes: Right paratracheal node 1.3 cm in short axis on image 18/2, stable by my measurements. Additional smaller paratracheal nodes are likewise stable. Subcarinal node 1.8 cm in short axis, image 33/2, stable. Borderline esophageal wall thickening in the mid esophagus, stable. Lungs/Pleura: Stable trace left pleural fluid. Stable left infrahilar airspace opacity with air bronchograms and postoperative findings. Some of the findings may be from prior radiation therapy. Stable emphysema and peripheral interstitial accentuation in both lungs Musculoskeletal: Nonunion at the site of the left seventh rib osteotomy. Thoracic spondylosis. CT ABDOMEN PELVIS FINDINGS Hepatobiliary: Stable 4 mm hypodense lesion in segment 2 of the liver, image 50/2. Numerous small gallstones in the gallbladder. No biliary dilatation. Pancreas: Unremarkable Spleen: Unremarkable Adrenals/Urinary Tract: Stable small renal cysts. Adrenal glands normal. Stomach/Bowel: Unremarkable Vascular/Lymphatic: Aortoiliac atherosclerotic vascular disease. A 1.0 cm right gastric lymph node on image 58/2  previously measured 0.7 cm. Reproductive: Mild prostatomegaly with stable accentuated density along the seminal vesicles. Other: No supplemental non-categorized findings. Musculoskeletal: Left hip screw. Interbody fusion at L5-S1. Multilevel lumbar impingement due to spurring. IMPRESSION: 1. Stable mild mediastinal adenopathy. A right gastric node is currently borderline prominent at 1.0 cm in diameter, previously 0.7 cm. 2. Stable postoperative and post radiation therapy findings in the left infrahilar region. 3. No compelling findings of progressive malignancy. 4. Other imaging findings of potential clinical significance: Aortic Atherosclerosis (ICD10-I70.0). Coronary atherosclerosis. Mild cardiomegaly. Mild aortic valve calcification. Possible mild esophagitis given wall thickening in the mid esophagus, stable. Cholelithiasis. Mild prostatomegaly. Multilevel lumbar impingement. Electronically Signed   By: Victor ClinesM.D.   On: 01/01/2019 11:08     ASSESSMENT/PLAN:  This is a very pleasant 68year old Caucasian male with metastatic non-small cell lung cancer, adenocarcinoma. He was initially diagnosed as astage IIIa. He presented with a left lower lobe lung mass in addition to mediastinal lymphadenopathy. He was diagnosed in January 2016. He is status post left lower lobectomy with lymph node dissection followed by concurrent chemoradiation. His PDL 1 expression is 5%.  He was diagnosed with metastatic disease involving the leftfemuras well as a left supraclavicular nodalmetastasis and right paratracheal lymphadenopathy in October 2018.  Hewasstarted on systemic chemotherapy with carboplatin, Alimta, and Keytruda. Carboplatin was discontinued after cycle #  2 due to a hypersensitivity reaction. Alimta was also discontinued after cycle #4 due to intolerance. The patient has been on maintenance treatment with single agent Keytruda for 28cycles.  The patient was seen with Dr. Julien Nordmann.  Labs were reviewed. We recommend that he proceed with cycle #29 today as scheduled.   We will see him back for a follow up visit in 3 weeks for evaluation before starting cycle #30.   Per Dr. Julien Nordmann, we will consider the patient for repeat CT of the left thigh with the next staging work-up or sooner if he has any more symptoms in that area.  For his hypothyroidism, the patient is currently taking 150 mcg of synthroid as prescribed by his primary doctor. I discussed today's TSH with the patient. His dose has been adjusted recently and the patient had been taking an old prescription at the incorrect dose. He now is on the correct prescription. We will continue to monitor his thyroid closely and will obtain a repeat TSH in 3 weeks and make dose adjustments of necessary.   The patient was advised to call immediately if he has any concerning symptoms in the interval. The patient voices understanding of current disease status and treatment options and is in agreement with the current care plan. All questions were answered. The patient knows to call the clinic with any problems, questions or concerns. We can certainly see the patient much sooner if necessary  No orders of the defined types were placed in this encounter.    Victor Tweed L Mark Benecke, Victor Huber 01/22/19  ADDENDUM: Hematology/Oncology Attending: I had a face-to-face encounter with the patient today.  I recommended his care plan.  This is a very pleasant 68 years old white male with metastatic non-small cell lung cancer, adenocarcinoma with no actionable mutations.  He is currently undergoing maintenance treatment with single agent Keytruda status post 28 cycles. The patient has been tolerating this treatment well with no concerning complaints. I recommended for him to proceed with cycle #29 today as planned. He will come back for follow-up visit in 3 weeks for evaluation before the next cycle of his treatment. For the left side pain, we  will continue to monitor for now and consider imaging studies with his upcoming staging scan. The patient was advised to call immediately if he has any other concerning symptoms in the interval.  Disclaimer: This note was dictated with voice recognition software. Similar sounding words can inadvertently be transcribed and may be missed upon review. Victor Kempf, Victor Huber 01/22/19

## 2019-01-22 NOTE — Patient Instructions (Signed)
[facility] Discharge Instructions for Patients Receiving Chemotherapy  Today you received the following chemotherapy agents:  Keytruda.  To help prevent nausea and vomiting after your treatment, we encourage you to take your nausea medication as directed.   If you develop nausea and vomiting that is not controlled by your nausea medication, call the clinic.   BELOW ARE SYMPTOMS THAT SHOULD BE REPORTED IMMEDIATELY:  *FEVER GREATER THAN 100.5 F  *CHILLS WITH OR WITHOUT FEVER  NAUSEA AND VOMITING THAT IS NOT CONTROLLED WITH YOUR NAUSEA MEDICATION  *UNUSUAL SHORTNESS OF BREATH  *UNUSUAL BRUISING OR BLEEDING  TENDERNESS IN MOUTH AND THROAT WITH OR WITHOUT PRESENCE OF ULCERS  *URINARY PROBLEMS  *BOWEL PROBLEMS  UNUSUAL RASH Items with * indicate a potential emergency and should be followed up as soon as possible.  Feel free to call the clinic should you have any questions or concerns. The clinic phone number is [phone].  Please show the CHEMO ALERT CARD at check-in to the Emergency Department and triage nurse.    

## 2019-01-31 ENCOUNTER — Encounter (INDEPENDENT_AMBULATORY_CARE_PROVIDER_SITE_OTHER): Payer: Self-pay

## 2019-02-02 ENCOUNTER — Other Ambulatory Visit: Payer: Self-pay

## 2019-02-03 ENCOUNTER — Ambulatory Visit (INDEPENDENT_AMBULATORY_CARE_PROVIDER_SITE_OTHER): Payer: Medicare HMO | Admitting: Family Medicine

## 2019-02-03 ENCOUNTER — Encounter: Payer: Self-pay | Admitting: Family Medicine

## 2019-02-03 VITALS — BP 120/70 | HR 92 | Ht 74.0 in | Wt 263.0 lb

## 2019-02-03 DIAGNOSIS — G8929 Other chronic pain: Secondary | ICD-10-CM

## 2019-02-03 DIAGNOSIS — J449 Chronic obstructive pulmonary disease, unspecified: Secondary | ICD-10-CM

## 2019-02-03 DIAGNOSIS — R7309 Other abnormal glucose: Secondary | ICD-10-CM

## 2019-02-03 DIAGNOSIS — C3492 Malignant neoplasm of unspecified part of left bronchus or lung: Secondary | ICD-10-CM | POA: Diagnosis not present

## 2019-02-03 DIAGNOSIS — Z8639 Personal history of other endocrine, nutritional and metabolic disease: Secondary | ICD-10-CM

## 2019-02-03 DIAGNOSIS — Z7689 Persons encountering health services in other specified circumstances: Secondary | ICD-10-CM

## 2019-02-03 DIAGNOSIS — Z8659 Personal history of other mental and behavioral disorders: Secondary | ICD-10-CM | POA: Diagnosis not present

## 2019-02-03 DIAGNOSIS — C3490 Malignant neoplasm of unspecified part of unspecified bronchus or lung: Secondary | ICD-10-CM | POA: Insufficient documentation

## 2019-02-03 DIAGNOSIS — I482 Chronic atrial fibrillation, unspecified: Secondary | ICD-10-CM | POA: Diagnosis not present

## 2019-02-03 DIAGNOSIS — M545 Low back pain: Secondary | ICD-10-CM | POA: Diagnosis not present

## 2019-02-03 DIAGNOSIS — E785 Hyperlipidemia, unspecified: Secondary | ICD-10-CM | POA: Diagnosis not present

## 2019-02-03 DIAGNOSIS — C7951 Secondary malignant neoplasm of bone: Secondary | ICD-10-CM | POA: Diagnosis not present

## 2019-02-03 DIAGNOSIS — Z125 Encounter for screening for malignant neoplasm of prostate: Secondary | ICD-10-CM

## 2019-02-03 MED ORDER — OXYCODONE HCL 10 MG PO TABS: ORAL_TABLET | ORAL | 0 refills | Status: DC

## 2019-02-03 NOTE — Progress Notes (Addendum)
Established Patient Office Visit  Subjective:  Patient ID: Victor Huber, male    DOB: 07-25-1950  Age: 68 y.o. MRN: [id number]  CC:  Chief Complaint  Patient presents with  . Annual Exam    HPI Victor Huber presents for establishment of care he is accompanied by his wife.  Moved here from [place] 3 years ago to be closer to his son daughter-in-law 45-year-old granddaughter.  Diagnosed with adenocarcinoma of the lung in [place].  Has been doing well but was recently diagnosed with a metastatic lesion to his left femur.  He is status post recent chemotherapy that he did not tolerate.  Lesion was further treated with radiation therapy and is now stable he tells me.  Currently being followed for this issue by oncology.  He had quit smoking several years ago.  He has a history of COPD.  He does experience dyspnea on exertion.  He currently is taking albuterol for treatment of his COPD.  Past medical history of chronic atrial fibrillation.  Anticoagulated with Eliquis and on daily Tenormin for rate control.  Seeing cardiology once a year for this issue.  History of elevated cholesterol on atorvastatin.  History of hypothyroidism on Synthroid.  History of chronic back pain.  He was disabled at age 36 from a back injury.  He uses oxycodone on a as needed basis for this issue.  He mostly takes ibuprofen for his back.  Laboratory review shows elevated glucose.  History of depression treated with Celexa.  He has been on multiple antidepressants but Celexa has worked the best for him.   Past Medical History:  Diagnosis Date  . Adenocarcinoma of left lung, stage 3 ([place]) 08/15/2016  . Arthritis   . Atrial fibrillation ([place]) 08/15/2016  . Atrial fibrillation ([place])   . COPD (chronic obstructive pulmonary disease) ([place]) 08/15/2016  . History of chemotherapy   . History of radiation therapy   . Hyperlipidemia   . Hypothyroid 08/15/2016  . Longstanding persistent atrial fibrillation ([place]) 08/29/2016   . Pathologic fracture    left femur  . Pneumonitis   . S/P TURP 08/15/2016  . Wears glasses   . Wears hearing aid in both ears     Past Surgical History:  Procedure Laterality Date  . BRONCHOSCOPY  10/2014  . CARDIAC CATHETERIZATION     05/07/12  . CARDIOVERSION     x2  . COLONOSCOPY    . DG BIOPSY LUNG Left 10/2014   FNA - Adenocarcinoma   . FEMUR IM NAIL Left 02/19/2017  . FEMUR IM NAIL Left 02/19/2017   Procedure: INTRAMEDULLARY (IM) NAIL FEMORAL;  Surgeon: Marchia Bond, MD;  Location: [facility];  Service: Orthopedics;  Laterality: Left;  . IR FLUORO GUIDE PORT INSERTION RIGHT  07/03/2017  . IR US GUIDE VASC ACCESS RIGHT  07/03/2017  . LUNG CANCER SURGERY Left 12/2014   Wedge Resection   . MULTIPLE TOOTH EXTRACTIONS    . Status post TURP    . TONSILLECTOMY      Family History  Problem Relation Age of Onset  . Breast cancer Mother   . Breast cancer Maternal Aunt   . Breast cancer Maternal Aunt   . Lung disease Neg Hx     Social History   Socioeconomic History  . Marital status: Married    Spouse name: Not on file  . Number of children: Not on file  . Years of education: Not on file  . Highest education level: Not on file  Occupational History  . Not on file  Social Needs  . Financial resource strain: Not on file  . Food insecurity    Worry: Not on file    Inability: Not on file  . Transportation needs    Medical: Not on file    Non-medical: Not on file  Tobacco Use  . Smoking status: Former Smoker    Packs/day: 1.00    Years: 50.00    Pack years: 50.00    Types: Cigarettes    Quit date: 08/15/2012    Years since quitting: 6.5  . Smokeless tobacco: Never Used  Substance and Sexual Activity  . Alcohol use: No  . Drug use: No  . Sexual activity: Not on file  Lifestyle  . Physical activity    Days per week: Not on file    Minutes per session: Not on file  . Stress: Not on file  Relationships  . Social Herbalist on phone: Not on file    Gets  together: Not on file    Attends religious service: Not on file    Active member of club or organization: Not on file    Attends meetings of clubs or organizations: Not on file    Relationship status: Not on file  . Intimate partner violence    Fear of current or ex partner: Not on file    Emotionally abused: Not on file    Physically abused: Not on file    Forced sexual activity: Not on file  Other Topics Concern  . Not on file  Social History Narrative   [facility] (09/26/16):   Originally from [place]. Moved to [facility] February 2018. Always lived in [place]. Moved to be closer to children & grandchildren. No international travel. 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 No mold exposure. No bird exposure or hot tub exposure. Enjoys reading. Previously enjoyed wood working with domestic woods.     Outpatient Medications Prior to Visit  Medication Sig Dispense Refill  . Albuterol Sulfate (VENTOLIN HFA IN) Inhale into the lungs as needed.    Marland Kitchen apixaban (ELIQUIS) 5 MG TABS tablet Take 1 tablet (5 mg total) by mouth 2 (two) times daily. 180 tablet 3  . atenolol (TENORMIN) 50 MG tablet Take 1 tablet (50 mg total) by mouth daily. 90 tablet 3  . atorvastatin (LIPITOR) 40 MG tablet Take 40 mg by mouth daily.    . citalopram (CELEXA) 40 MG tablet Take 40 mg by mouth daily.    Marland Kitchen ibuprofen (ADVIL,MOTRIN) 400 MG tablet Take 400 mg by mouth every 4 (four) hours as needed for moderate pain.    . tamsulosin (FLOMAX) 0.4 MG CAPS capsule Take 0.4 mg daily by mouth.     . levothyroxine (SYNTHROID, LEVOTHROID) 150 MCG tablet Take 150 mcg by mouth daily.    . Oxycodone HCl 10 MG TABS Take 10 mg by mouth as needed.     No facility-administered medications prior to visit.     Allergies  Allergen Reactions  . Carboplatin Itching, Nausea And Vomiting and Other (See Comments)    Flushing  . Flecainide Hypertension    CAUSED HEART  ISSUES     ROS Review of Systems  Constitutional: Negative for diaphoresis, fatigue, fever and unexpected weight change.  HENT: Negative.   Eyes: Negative for photophobia and visual disturbance.  Respiratory: Positive for shortness of breath. Negative for chest tightness.  Cardiovascular: Positive for palpitations. Negative for chest pain.  Gastrointestinal: Negative.   Endocrine: Negative for cold intolerance, heat intolerance, polyphagia and polyuria.  Genitourinary: Negative.   Musculoskeletal: Negative for gait problem and joint swelling.  Skin: Negative for pallor and rash.  Allergic/Immunologic: Negative for immunocompromised state.  Neurological: Negative for speech difficulty, weakness and light-headedness.  Hematological: Bruises/bleeds easily.  Psychiatric/Behavioral: Negative.       Objective:    Physical Exam  Constitutional: He is oriented to person, place, and time. He appears well-developed and well-nourished. No distress.  HENT:  Head: Normocephalic and atraumatic.  Right Ear: External ear normal.  Left Ear: External ear normal.  Eyes: Conjunctivae are normal. Right eye exhibits no discharge. Left eye exhibits no discharge. No scleral icterus.  Neck: No JVD present. No tracheal deviation present.  Cardiovascular: An irregularly irregular rhythm present.  Pulmonary/Chest: Effort normal and breath sounds normal. No stridor. No respiratory distress. He has no wheezes. He has no rales.  Musculoskeletal:        General: No edema.  Neurological: He is alert and oriented to person, place, and time.  Skin: Skin is warm and dry. He is not diaphoretic.  Psychiatric: He has a normal mood and affect.    BP 120/70   Pulse 92   Ht 6' 2"  (1.88 m)   Wt 263 lb (119.3 kg)   SpO2 96%   BMI 33.77 kg/m  Wt Readings from Last 3 Encounters:  03/05/19 268 lb 8 oz (121.8 kg)  02/12/19 264 lb 11.2 oz (120.1 kg)  02/03/19 263 lb (119.3 kg)   BP Readings from Last 3  Encounters:  03/05/19 108/74  02/12/19 112/82  02/03/19 120/70   Guideline developer:  UpToDate (see UpToDate for funding source) Date Released: June 2014  Health Maintenance Due  Topic Date Due  . Hepatitis C Screening  [birth year]  . COLONOSCOPY  09/29/1998    There are no preventive care reminders to display for this patient.  Lab Results  Component Value Date   TSH 5.66 (H) 03/06/2019   Lab Results  Component Value Date   WBC 6.4 03/06/2019   HGB 16.2 03/06/2019   HCT 47.4 03/06/2019   MCV 101.6 (H) 03/06/2019   PLT 159.0 03/06/2019   Lab Results  Component Value Date   NA 140 03/06/2019   K 4.3 03/06/2019   CHLORIDE 102 04/04/2017   CO2 28 03/06/2019   GLUCOSE 120 (H) 03/06/2019   BUN 20 03/06/2019   CREATININE 1.08 03/06/2019   BILITOT 0.9 03/06/2019   ALKPHOS 72 03/06/2019   AST 20 03/06/2019   ALT 19 03/06/2019   PROT 6.1 03/06/2019   ALBUMIN 4.1 03/06/2019   CALCIUM 9.2 03/06/2019   ANIONGAP 8 03/05/2019   EGFR >60 04/04/2017   GFR 67.51 03/06/2019   Lab Results  Component Value Date   CHOL 122 03/06/2019   Lab Results  Component Value Date   HDL 35.90 (L) 03/06/2019   Lab Results  Component Value Date   LDLCALC 34 12/19/2016   Lab Results  Component Value Date   TRIG 203.0 (H) 03/06/2019   Lab Results  Component Value Date   CHOLHDL 3 03/06/2019   Lab Results  Component Value Date   HGBA1C 5.4 03/06/2019      Assessment & Plan:   Problem List Items Addressed This Visit      Cardiovascular and Mediastinum   Chronic atrial fibrillation (HCC)     Respiratory   Chronic obstructive  pulmonary disease ([place])   Relevant Orders   Ambulatory referral to Pulmonology   Adenocarcinoma of left lung, stage 4 ([place])   Relevant Orders   Ambulatory referral to Pulmonology     Musculoskeletal and Integument   Metastasis to bone ([place])     Other   Hyperlipidemia - Primary   Relevant Orders   CBC (Completed)   Comprehensive metabolic  panel (Completed)   LDL cholesterol, direct (Completed)   Lipid panel (Completed)   Screening for prostate cancer   Relevant Orders   PSA (Completed)   Chronic low back pain   Relevant Medications   Oxycodone HCl 10 MG TABS   History of hypothyroidism   Relevant Medications   levothyroxine (SYNTHROID) 175 MCG tablet   Other Relevant Orders   TSH (Completed)   History of depression   Elevated glucose   Relevant Orders   CBC (Completed)   Comprehensive metabolic panel (Completed)   Hemoglobin A1c (Completed)   Urinalysis, Routine w reflex microscopic (Completed)      Meds ordered this encounter  Medications  . Oxycodone HCl 10 MG TABS    Sig: Take one daily as needed for severe back pain.    Dispense:  30 tablet    Refill:  0  . levothyroxine (SYNTHROID) 175 MCG tablet    Sig: Take 1 tablet (175 mcg total) by mouth daily before breakfast.    Dispense:  90 tablet    Refill:  0    Follow-up: Return in about 1 month (around 03/05/2019).    Patient will follow-up for blood work in 3 to 4 weeks and then see me for discussion of the results and further physical exam.

## 2019-02-05 ENCOUNTER — Telehealth: Payer: Self-pay | Admitting: Family Medicine

## 2019-02-05 NOTE — Telephone Encounter (Signed)
Oxycodone HCl 10 MG TABS    Pharmacist with [facility] requesting call back as insurance will only pay for a 7 day supply for the first fill.     CB# [phone] Reference#  [id number]

## 2019-02-05 NOTE — Telephone Encounter (Signed)
Spoke with pharmacy, they will fill the entire Rx for pt.

## 2019-02-12 ENCOUNTER — Inpatient Hospital Stay: Payer: Medicare HMO

## 2019-02-12 ENCOUNTER — Inpatient Hospital Stay (HOSPITAL_BASED_OUTPATIENT_CLINIC_OR_DEPARTMENT_OTHER): Payer: Medicare HMO | Admitting: Internal Medicine

## 2019-02-12 ENCOUNTER — Other Ambulatory Visit: Payer: Self-pay

## 2019-02-12 ENCOUNTER — Inpatient Hospital Stay: Payer: Medicare HMO | Attending: Internal Medicine

## 2019-02-12 ENCOUNTER — Encounter: Payer: Self-pay | Admitting: Internal Medicine

## 2019-02-12 VITALS — BP 112/82 | HR 65 | Temp 98.5°F | Resp 18 | Ht 74.0 in | Wt 264.7 lb

## 2019-02-12 DIAGNOSIS — C3492 Malignant neoplasm of unspecified part of left bronchus or lung: Secondary | ICD-10-CM

## 2019-02-12 DIAGNOSIS — Z79899 Other long term (current) drug therapy: Secondary | ICD-10-CM | POA: Diagnosis not present

## 2019-02-12 DIAGNOSIS — Z9221 Personal history of antineoplastic chemotherapy: Secondary | ICD-10-CM | POA: Diagnosis not present

## 2019-02-12 DIAGNOSIS — Z902 Acquired absence of lung [part of]: Secondary | ICD-10-CM | POA: Insufficient documentation

## 2019-02-12 DIAGNOSIS — Z7901 Long term (current) use of anticoagulants: Secondary | ICD-10-CM | POA: Diagnosis not present

## 2019-02-12 DIAGNOSIS — C3432 Malignant neoplasm of lower lobe, left bronchus or lung: Secondary | ICD-10-CM | POA: Diagnosis not present

## 2019-02-12 DIAGNOSIS — C7951 Secondary malignant neoplasm of bone: Secondary | ICD-10-CM

## 2019-02-12 DIAGNOSIS — E039 Hypothyroidism, unspecified: Secondary | ICD-10-CM | POA: Diagnosis not present

## 2019-02-12 DIAGNOSIS — Z923 Personal history of irradiation: Secondary | ICD-10-CM | POA: Diagnosis not present

## 2019-02-12 DIAGNOSIS — I4819 Other persistent atrial fibrillation: Secondary | ICD-10-CM | POA: Insufficient documentation

## 2019-02-12 DIAGNOSIS — Z5112 Encounter for antineoplastic immunotherapy: Secondary | ICD-10-CM | POA: Insufficient documentation

## 2019-02-12 DIAGNOSIS — Z95828 Presence of other vascular implants and grafts: Secondary | ICD-10-CM

## 2019-02-12 LAB — CBC WITH DIFFERENTIAL (CANCER CENTER ONLY)
Abs Immature Granulocytes: 0.03 10*3/uL (ref 0.00–0.07)
Basophils Absolute: 0 10*3/uL (ref 0.0–0.1)
Basophils Relative: 0 %
Eosinophils Absolute: 0.1 10*3/uL (ref 0.0–0.5)
Eosinophils Relative: 2 %
HCT: 45.3 % (ref 39.0–52.0)
Hemoglobin: 16 g/dL (ref 13.0–17.0)
Immature Granulocytes: 0 %
Lymphocytes Relative: 20 %
Lymphs Abs: 1.6 10*3/uL (ref 0.7–4.0)
MCH: 34.8 pg — ABNORMAL HIGH (ref 26.0–34.0)
MCHC: 35.3 g/dL (ref 30.0–36.0)
MCV: 98.5 fL (ref 80.0–100.0)
Monocytes Absolute: 0.6 10*3/uL (ref 0.1–1.0)
Monocytes Relative: 7 %
Neutro Abs: 5.4 10*3/uL (ref 1.7–7.7)
Neutrophils Relative %: 71 %
Platelet Count: 168 10*3/uL (ref 150–400)
RBC: 4.6 MIL/uL (ref 4.22–5.81)
RDW: 13.2 % (ref 11.5–15.5)
WBC Count: 7.7 10*3/uL (ref 4.0–10.5)
nRBC: 0 % (ref 0.0–0.2)

## 2019-02-12 LAB — CMP (CANCER CENTER ONLY)
ALT: 20 U/L (ref 0–44)
AST: 21 U/L (ref 15–41)
Albumin: 4.1 g/dL (ref 3.5–5.0)
Alkaline Phosphatase: 79 U/L (ref 38–126)
Anion gap: 9 (ref 5–15)
BUN: 14 mg/dL (ref 8–23)
CO2: 27 mmol/L (ref 22–32)
Calcium: 8.9 mg/dL (ref 8.9–10.3)
Chloride: 104 mmol/L (ref 98–111)
Creatinine: 1.02 mg/dL (ref 0.61–1.24)
GFR, Est AFR Am: >60 mL/min (ref >60)
GFR, Estimated: >60 mL/min (ref >60)
Glucose, Bld: 122 mg/dL — ABNORMAL HIGH (ref 70–99)
Potassium: 4.7 mmol/L (ref 3.5–5.1)
Sodium: 140 mmol/L (ref 135–145)
Total Bilirubin: 1 mg/dL (ref 0.3–1.2)
Total Protein: 6.5 g/dL (ref 6.5–8.1)

## 2019-02-12 LAB — TSH: TSH: 7.429 u[IU]/mL — ABNORMAL HIGH (ref 0.320–4.118)

## 2019-02-12 MED ORDER — SODIUM CHLORIDE 0.9 % IV SOLN
200.0000 mg | Freq: Once | INTRAVENOUS | Status: AC
  Administered 2019-02-12: 200 mg via INTRAVENOUS
  Filled 2019-02-12: qty 8

## 2019-02-12 MED ORDER — SODIUM CHLORIDE 0.9% FLUSH
10.0000 mL | INTRAVENOUS | Status: DC | PRN
  Administered 2019-02-12: 10 mL
  Filled 2019-02-12: qty 10

## 2019-02-12 MED ORDER — HEPARIN SOD (PORK) LOCK FLUSH 100 UNIT/ML IV SOLN
500.0000 [IU] | Freq: Once | INTRAVENOUS | Status: AC | PRN
  Administered 2019-02-12: 500 [IU]
  Filled 2019-02-12: qty 5

## 2019-02-12 MED ORDER — SODIUM CHLORIDE 0.9 % IV SOLN
Freq: Once | INTRAVENOUS | Status: AC
  Administered 2019-02-12: 13:00:00 via INTRAVENOUS
  Filled 2019-02-12: qty 250

## 2019-02-12 NOTE — Progress Notes (Signed)
[facility] [phone]   [phone]  OFFICE PROGRESS NOTE  [name], MD [place] [id number]  DIAGNOSIS: Metastatic non-small cell lung cancer initially diagnosed as stage IIIA (T2a, N2, M0) non-small cell lung cancer, poorly differentiated adenocarcinoma presented with left lower lobe lung mass in addition to mediastinal lymphadenopathy.  The patient was diagnosed with metastatic disease involving the left femur as well as left supraclavicular nodal metastases and right paratracheal lymphadenopathy in October 2018.  Biomarker Findings Microsatellite Status - MS-Stable Tumor Mutational Burden - TMB-Low (3 Muts/Mb) Genomic Findings For a complete list of the genes assayed, please refer to the Appendix. STK11 P276f*6 [id number] site 1885+0Y>DDAXX E374* MLL2 [id number] NBN K23333f NOTCH2 [id number] splice site 988867+6H>MDisease relevant genes with no reportable alterations: EGFR, KRAS, ALK, BRAF, MET, RET, ERBB2, ROS1   PDL1 expression 5%  PRIOR THERAPY: 1) status post wedge resection of the left lower lobe lung mass as well as AP window lymph node dissection but there was residual metastatic mediastinal lymphadenopathy that could not be resected. 2) a course of concurrent chemorad[place]  3) status post palliative radiotherapy to the left femur metastatic bone disease. 4)  Systemic chemotherapy with carboplatin for AUC of 5, Alimta 500 mg/M2 and Keytruda 200 mg IV every 3 weeks.  First dose March 07, 2017.  Carboplatin was discontinued during cycle #2 secondary to hypersensitivity reaction. 5) status post 2 cycles of maintenance treatment with Alimta and Ketruda (pembrolizumab).  Alimta was discontinued secondary to intolerance.  CURRENT THERAPY: Maintenance treatment with single agent Ketruda (pembrolizumab) status  post 29  cycles.  INTERVAL HISTORY: Victor Huber 68. male returns to the clinic today for follow-up visit.  The patient is feeling very well today with no concerning complaints except for occasional shortness of breath.  He is scheduled to see Dr. ByrLamonte Sakai the next few weeks for evaluation of his COPD and adjustment of his medication.  He also switched to a new primary care physician.  The patient denied having any current chest pain, cough or hemoptysis.  He denied having any fever or chills.  He has no nausea, vomiting, diarrhea or constipation.  He continues to tolerate his treatment with maintenance Keytruda fairly well.  Is here for evaluation before starting cycle #34.  MEDICAL HISTORY: Past Medical History:  Diagnosis Date  . Adenocarcinoma of left lung, stage 3 ([facility]/16/2018  . Arthritis   . Atrial fibrillation ([facility]/16/2018  . Atrial fibrillation ([facility] . COPD (chronic obstructive pulmonary disease) ([facility]/16/2018  . History of chemotherapy   . History of radiation therapy   . Hyperlipidemia   . Hypothyroid 08/15/2016  . Longstanding persistent atrial fibrillation ([facility]/30/2018  . Pathologic fracture    left femur  . Pneumonitis   . S/P TURP 08/15/2016  . Wears glasses   . Wears hearing aid in both ears     ALLERGIES:  is allergic to carboplatin and flecainide.  MEDICATIONS:  Current Outpatient Medications  Medication Sig Dispense Refill  . Albuterol Sulfate (VENTOLIN HFA IN) Inhale into the lungs as needed.    . aMarland Kitchenixaban (ELIQUIS) 5 MG TABS tablet Take 1 tablet (5 mg total) by mouth 2 (two) times daily. 180 tablet 3  . atenolol (TENORMIN) 50 MG tablet Take 1 tablet (50 mg total) by mouth daily. 90 tablet 3  .  atorvastatin (LIPITOR) 40 MG tablet Take 40 mg by mouth daily.    . citalopram (CELEXA) 40 MG tablet Take 40 mg by mouth daily.    Marland Kitchen ibuprofen (ADVIL,MOTRIN) 400 MG tablet Take 400 mg by mouth every 4 (four) hours as needed for moderate pain.    Marland Kitchen  levothyroxine (SYNTHROID, LEVOTHROID) 150 MCG tablet Take 150 mcg by mouth daily.    . Oxycodone HCl 10 MG TABS Take one daily as needed for severe back pain. 30 tablet 0  . tamsulosin (FLOMAX) 0.4 MG CAPS capsule Take 0.4 mg daily by mouth.      No current facility-administered medications for this visit.     SURGICAL HISTORY:  Past Surgical History:  Procedure Laterality Date  . BRONCHOSCOPY  10/2014  . CARDIAC CATHETERIZATION     05/07/12  . CARDIOVERSION     x2  . COLONOSCOPY    . DG BIOPSY LUNG Left 10/2014   FNA - Adenocarcinoma   . FEMUR IM NAIL Left 02/19/2017  . FEMUR IM NAIL Left 02/19/2017   Procedure: INTRAMEDULLARY (IM) NAIL FEMORAL;  Surgeon: Marchia Bond, MD;  Location: [facility];  Service: Orthopedics;  Laterality: Left;  . IR FLUORO GUIDE PORT INSERTION RIGHT  07/03/2017  . IR US GUIDE VASC ACCESS RIGHT  07/03/2017  . LUNG CANCER SURGERY Left 12/2014   Wedge Resection   . MULTIPLE TOOTH EXTRACTIONS    . Status post TURP    . TONSILLECTOMY      REVIEW OF SYSTEMS:  A comprehensive review of systems was negative except for: Respiratory: positive for dyspnea on exertion   PHYSICAL EXAMINATION: General appearance: alert, cooperative and no distress Head: Normocephalic, without obvious abnormality, atraumatic Neck: no adenopathy, no JVD, supple, symmetrical, trachea midline and thyroid not enlarged, symmetric, no tenderness/mass/nodules Lymph nodes: Cervical, supraclavicular, and axillary nodes normal. Resp: clear to auscultation bilaterally Back: symmetric, no curvature. ROM normal. No CVA tenderness. Cardio: regular rate and rhythm, S1, S2 normal, no murmur, click, rub or gallop GI: soft, non-tender; bowel sounds normal; no masses,  no organomegaly Extremities: extremities normal, atraumatic, no cyanosis or edema  ECOG PERFORMANCE STATUS: 1 - Symptomatic but completely ambulatory  Blood pressure 112/82, pulse 65, temperature 98.5 F (36.9 C), temperature source  Temporal, resp. rate 18, height 6' 2" (1.88 m), weight 264 lb 11.2 oz (120.1 kg), SpO2 93 %.  LABORATORY DATA: Lab Results  Component Value Date   WBC 7.7 02/12/2019   HGB 16.0 02/12/2019   HCT 45.3 02/12/2019   MCV 98.5 02/12/2019   PLT 168 02/12/2019      Chemistry      Component Value Date/Time   NA 140 02/12/2019 1122   NA 136 04/04/2017 1141   K 4.7 02/12/2019 1122   K 4.4 04/04/2017 1141   CL 104 02/12/2019 1122   CO2 27 02/12/2019 1122   CO2 24 04/04/2017 1141   BUN 14 02/12/2019 1122   BUN 21.1 04/04/2017 1141   CREATININE 1.02 02/12/2019 1122   CREATININE 1.1 04/04/2017 1141      Component Value Date/Time   CALCIUM 8.9 02/12/2019 1122   CALCIUM 9.1 04/04/2017 1141   ALKPHOS 79 02/12/2019 1122   ALKPHOS 89 04/04/2017 1141   AST 21 02/12/2019 1122   AST 17 04/04/2017 1141   ALT 20 02/12/2019 1122   ALT 18 04/04/2017 1141   BILITOT 1.0 02/12/2019 1122   BILITOT 0.67 04/04/2017 1141       RADIOGRAPHIC STUDIES: No results found.  ASSESSMENT AND PLAN: This is a 68 years old white male with metastatic non-small cell lung cancer, adenocarcinoma with no actionable mutations and PDL 1 expression of 5% that was initially diagnosed as stage IIIa non-small cell lung cancer, adenocarcinoma status post left lower lobectomy with lymph node dissection followed by a course of concurrent chemoradiation completed in January 2016.  The patient had evidence for disease metastasis in October 2018 with metastatic disease to the left femur as well as left supraclavicular and right paratracheal lymph nodes. The patient is currently on systemic chemotherapy initially was with carboplatin, Alimta and Keytruda.  Carboplatin was discontinued secondary to hypersensitivity reaction starting from cycle #2.   He was also treated with 3 cycles of maintenance Alimta and Ketruda (pembrolizumab) but Alimta was discontinued secondary to intolerance. He is currently undergoing treatment with  maintenance Keytruda as a single agent status post 29 cycles.  The patient has been tolerating this treatment well with no concerning adverse effects. I recommended for him to proceed with cycle #30 of the maintenance treatment today as planned. He will come back for follow-up visit in 3 weeks for evaluation before the next cycle of his treatment. For the hypothyroidism he will continue his current treatment with levothyroxine and we will continue to monitor his TSH closely. The patient was advised to call immediately if he has any concerning symptoms in the interval. The patient voices understanding of current disease status and treatment options and is in agreement with the current care plan. All questions were answered. The patient knows to call the clinic with any problems, questions or concerns. We can certainly see the patient much sooner if necessary.  Disclaimer: This note was dictated with voice recognition software. Similar sounding words can inadvertently be transcribed and may not be corrected upon review.

## 2019-02-12 NOTE — Patient Instructions (Signed)
Pembrolizumab injection What is this medicine? PEMBROLIZUMAB (pem broe liz ue mab) is a monoclonal antibody. It is used to treat bladder cancer, cervical cancer, endometrial cancer, esophageal cancer, head and neck cancer, hepatocellular cancer, Hodgkin lymphoma, kidney cancer, lymphoma, melanoma, Merkel cell carcinoma, lung cancer, stomach cancer, urothelial cancer, and cancers that have a certain genetic condition. This medicine may be used for other purposes; ask your health care provider or pharmacist if you have questions. COMMON BRAND NAME(S): Keytruda What should I tell my health care provider before I take this medicine? They need to know if you have any of these conditions:  diabetes  immune system problems  inflammatory bowel disease  liver disease  lung or breathing disease  lupus  received or scheduled to receive an organ transplant or a stem-cell transplant that uses donor stem cells  an unusual or allergic reaction to pembrolizumab, other medicines, foods, dyes, or preservatives  pregnant or trying to get pregnant  breast-feeding How should I use this medicine? This medicine is for infusion into a vein. It is given by a health care professional in a hospital or clinic setting. A special MedGuide will be given to you before each treatment. Be sure to read this information carefully each time. Talk to your pediatrician regarding the use of this medicine in children. While this drug may be prescribed for selected conditions, precautions do apply. Overdosage: If you think you have taken too much of this medicine contact a poison control center or emergency room at once. NOTE: This medicine is only for you. Do not share this medicine with others. What if I miss a dose? It is important not to miss your dose. Call your doctor or health care professional if you are unable to keep an appointment. What may interact with this medicine? Interactions have not been studied. Give  your health care provider a list of all the medicines, herbs, non-prescription drugs, or dietary supplements you use. Also tell them if you smoke, drink alcohol, or use illegal drugs. Some items may interact with your medicine. This list may not describe all possible interactions. Give your health care provider a list of all the medicines, herbs, non-prescription drugs, or dietary supplements you use. Also tell them if you smoke, drink alcohol, or use illegal drugs. Some items may interact with your medicine. What should I watch for while using this medicine? Your condition will be monitored carefully while you are receiving this medicine. You may need blood work done while you are taking this medicine. Do not become pregnant while taking this medicine or for 4 months after stopping it. Women should inform their doctor if they wish to become pregnant or think they might be pregnant. There is a potential for serious side effects to an unborn child. Talk to your health care professional or pharmacist for more information. Do not breast-feed an infant while taking this medicine or for 4 months after the last dose. What side effects may I notice from receiving this medicine? Side effects that you should report to your doctor or health care professional as soon as possible:  allergic reactions like skin rash, itching or hives, swelling of the face, lips, or tongue  bloody or black, tarry  breathing problems  changes in vision  chest pain  chills  confusion  constipation  cough  diarrhea  dizziness or feeling faint or lightheaded  fast or irregular heartbeat  fever  flushing  hair loss  joint pain  low blood counts - this  medicine may decrease the number of white blood cells, red blood cells and platelets. You may be at increased risk for infections and bleeding.  muscle pain  muscle weakness  persistent headache  redness, blistering, peeling or loosening of the skin,  including inside the mouth  signs and symptoms of high blood sugar such as dizziness; dry mouth; dry skin; fruity breath; nausea; stomach pain; increased hunger or thirst; increased urination  signs and symptoms of kidney injury like trouble passing urine or change in the amount of urine  signs and symptoms of liver injury like dark urine, light-colored stools, loss of appetite, nausea, right upper belly pain, yellowing of the eyes or skin  sweating  swollen lymph nodes  weight loss Side effects that usually do not require medical attention (report to your doctor or health care professional if they continue or are bothersome):  decreased appetite  muscle pain  tiredness This list may not describe all possible side effects. Call your doctor for medical advice about side effects. You may report side effects to FDA at 1-800-FDA-1088. Where should I keep my medicine? This drug is given in a hospital or clinic and will not be stored at home. NOTE: This sheet is a summary. It may not cover all possible information. If you have questions about this medicine, talk to your doctor, pharmacist, or health care provider.  2020 Elsevier/Gold Standard (2018-04-15 13:46:58)  Coronavirus (COVID-19) Are you at risk?  Are you at risk for the Coronavirus (COVID-19)?  To be considered HIGH RISK for Coronavirus (COVID-19), you have to meet the following criteria:  . Traveled to [place], [place], [place], [place] or [place]; or in the [place] to [place], [place], [place], or [place]; and have fever, cough, and shortness of breath within the last 2 weeks of travel OR . Been in close contact with a person diagnosed with COVID-19 within the last 2 weeks and have fever, cough, and shortness of breath . IF YOU DO NOT MEET THESE CRITERIA, YOU ARE CONSIDERED LOW RISK FOR COVID-19.  What to do if you are HIGH RISK for COVID-19?  Marland Kitchen If you are having a medical emergency, call 911. . Seek medical  care right away. Before you go to a doctor's office, urgent care or emergency department, call ahead and tell them about your recent travel, contact with someone diagnosed with COVID-19, and your symptoms. You should receive instructions from your physician's office regarding next steps of care.  . When you arrive at healthcare provider, tell the healthcare staff immediately you have returned from visiting [place], [place], [place], [place] or [place]; or traveled in the [place] to [place], [place], [place], or [place]; in the last two weeks or you have been in close contact with a person diagnosed with COVID-19 in the last 2 weeks.   . Tell the health care staff about your symptoms: fever, cough and shortness of breath. . After you have been seen by a medical provider, you will be either: o Tested for (COVID-19) and discharged home on quarantine except to seek medical care if symptoms worsen, and asked to  - Stay home and avoid contact with others until you get your results (4-5 days)  - Avoid travel on public transportation if possible (such as bus, train, or airplane) or o Sent to the Emergency Department by EMS for evaluation, COVID-19 testing, and possible admission depending on your condition and test results.  What to do if you are  LOW RISK for COVID-19?  Reduce your risk of any infection by using the same precautions used for avoiding the common cold or flu:  Marland Kitchen Wash your hands often with soap and warm water for at least 20 seconds.  If soap and water are not readily available, use an alcohol-based hand sanitizer with at least 60% alcohol.  . If coughing or sneezing, cover your mouth and nose by coughing or sneezing into the elbow areas of your shirt or coat, into a tissue or into your sleeve (not your hands). . Avoid shaking hands with others and consider head nods or verbal greetings only. . Avoid touching your eyes, nose, or mouth with unwashed hands.  . Avoid close contact with  people who are sick. . Avoid places or events with large numbers of people in one location, like concerts or sporting events. . Carefully consider travel plans you have or are making. . If you are planning any travel outside or inside the [place], visit the CDC's Travelers' Health webpage for the latest health notices. . If you have some symptoms but not all symptoms, continue to monitor at home and seek medical attention if your symptoms worsen. . If you are having a medical emergency, call 911.   [place] / e-Visit: [email or web address]         [facility]: [facility]: [phone]                   [facility]: [phone]

## 2019-02-13 ENCOUNTER — Telehealth: Payer: Self-pay | Admitting: Internal Medicine

## 2019-02-13 NOTE — Telephone Encounter (Signed)
Scheduled per los. Called and spoke with patients wife. Confirmed appt

## 2019-02-24 IMAGING — CT CT CHEST W/ CM
2 of 5 series · 12 of 36 positions shown, 15 images · IV contrast (APPLIED)
Comparison: Multiple exams, including 02/11/2018

CLINICAL DATA: Lung cancer restaging. Ongoing immunotherapy.
Chronic cough and back pain.

EXAM:
CT CHEST, ABDOMEN, AND PELVIS WITH CONTRAST
TECHNIQUE: Multidetector CT imaging of the chest, abdomen and pelvis was
performed following the standard protocol during bolus
administration of intravenous contrast.
CONTRAST:  100mL OMNIPAQUE IOHEXOL 300 MG/ML  SOLN

[Series 2: cap with · axial · 0.88mm/px · z∈[+970,+1520]mm · 9 of 136 slices shown, 12 images]
[im 13/136  mediastinal]
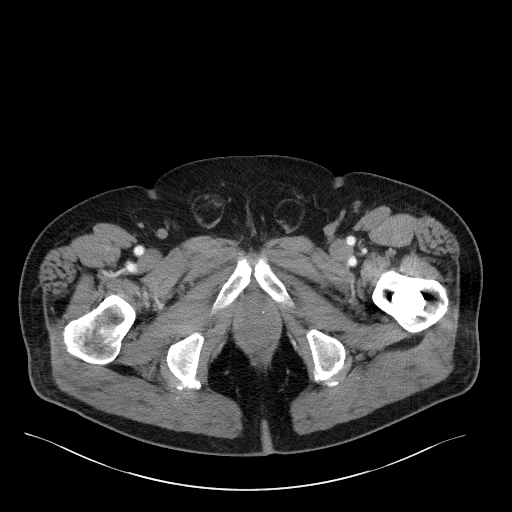
[im 13/136  lung]
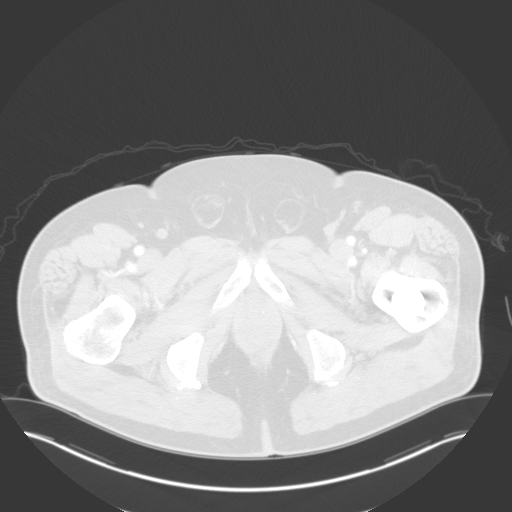
[im 25/136  lung]
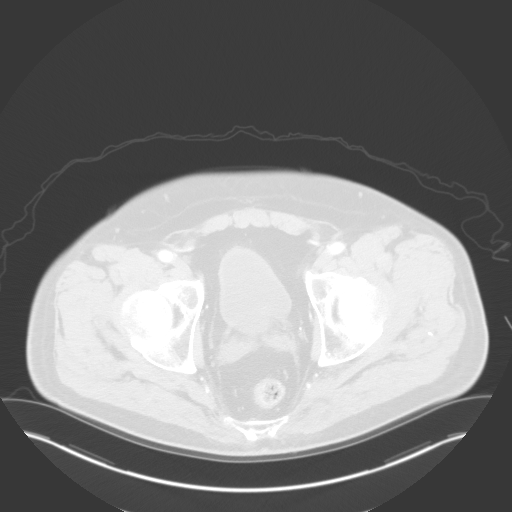
[im 37/136  lung]
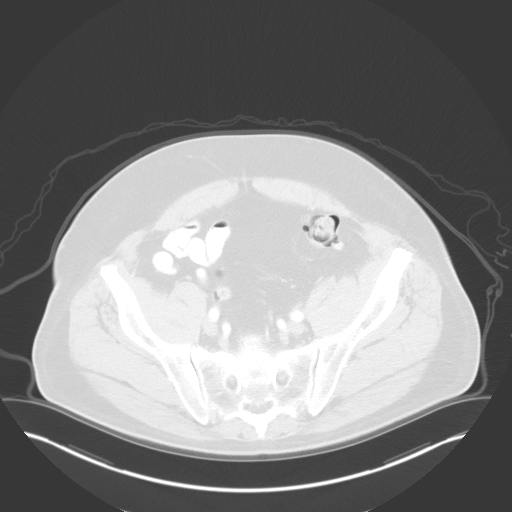
[im 50/136  lung]
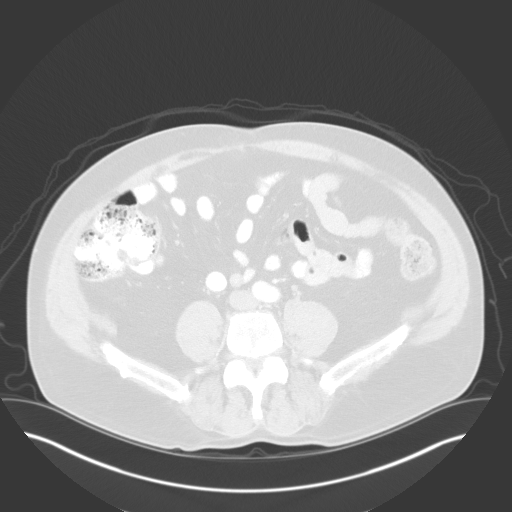
[im 74/136  mediastinal]
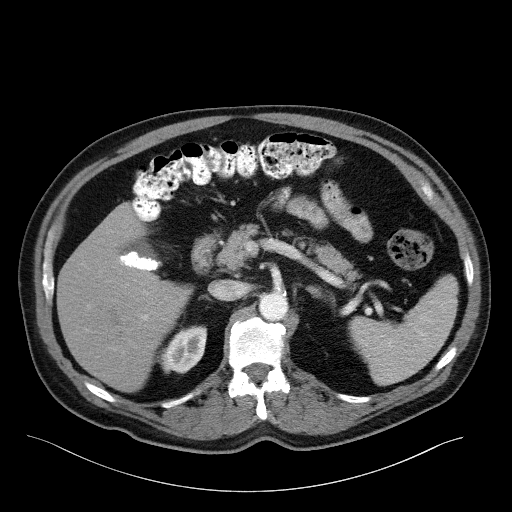
[im 74/136  lung]
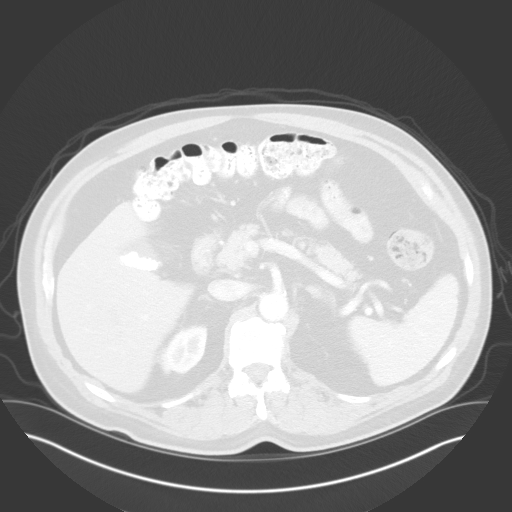
[im 86/136  lung]
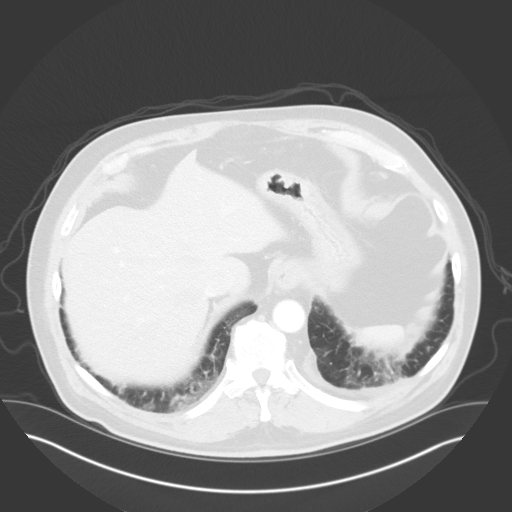
[im 99/136  lung]
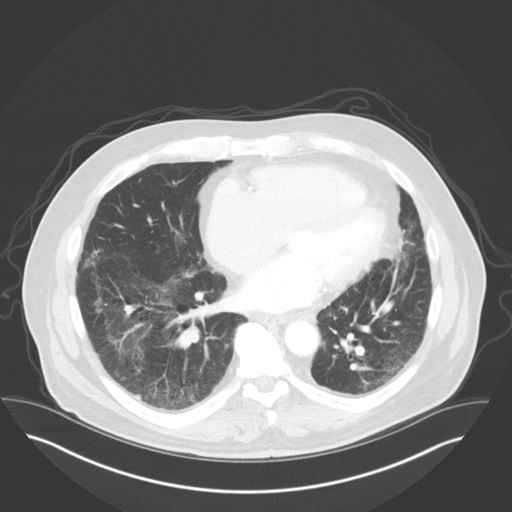
[im 111/136  lung]
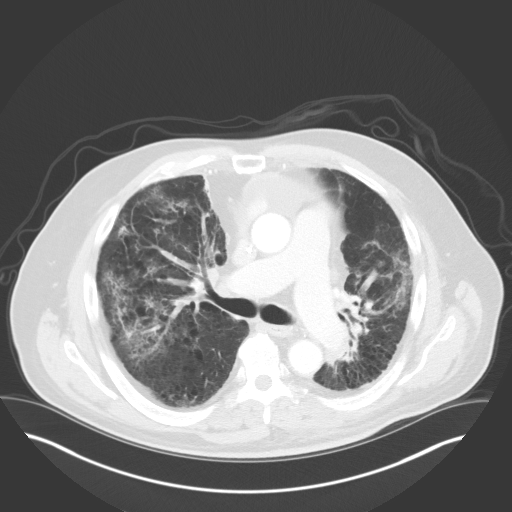
[im 123/136  mediastinal]
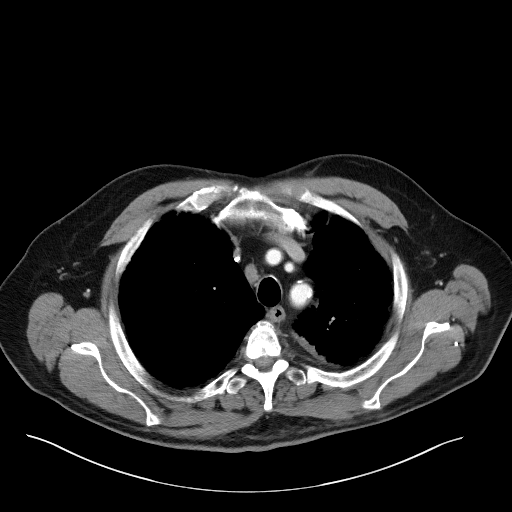
[im 123/136  lung]
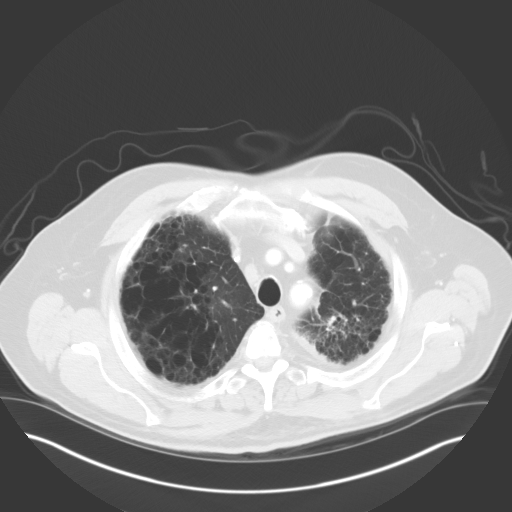

[Series 5: coronals · coronal · 0.79mm/px · 3 of 140 slices shown]
[im 28/140  lung]
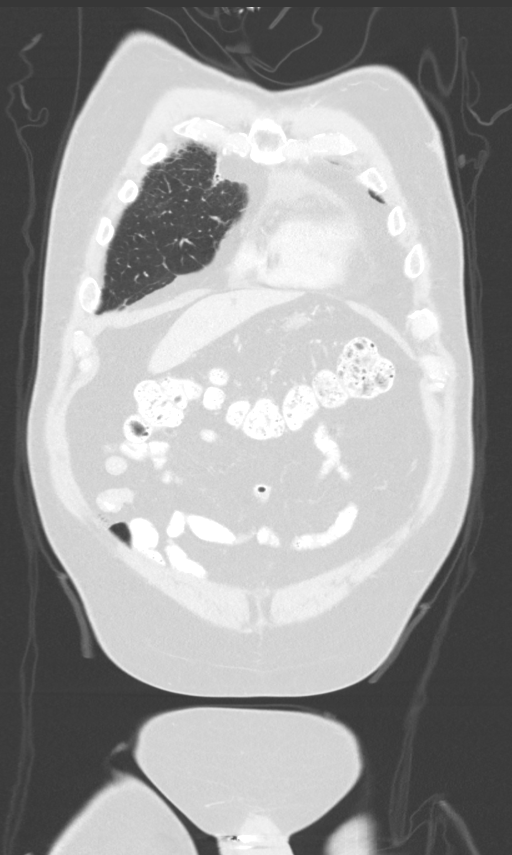
[im 56/140  lung]
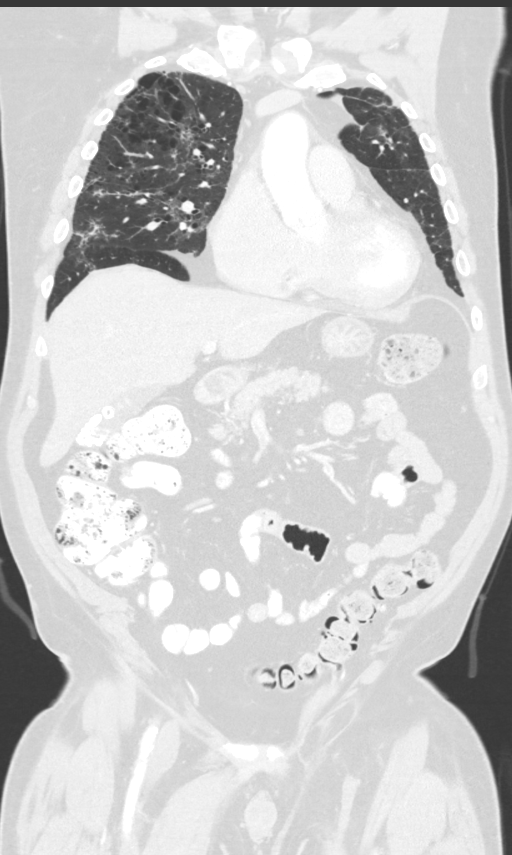
[im 84/140  lung]
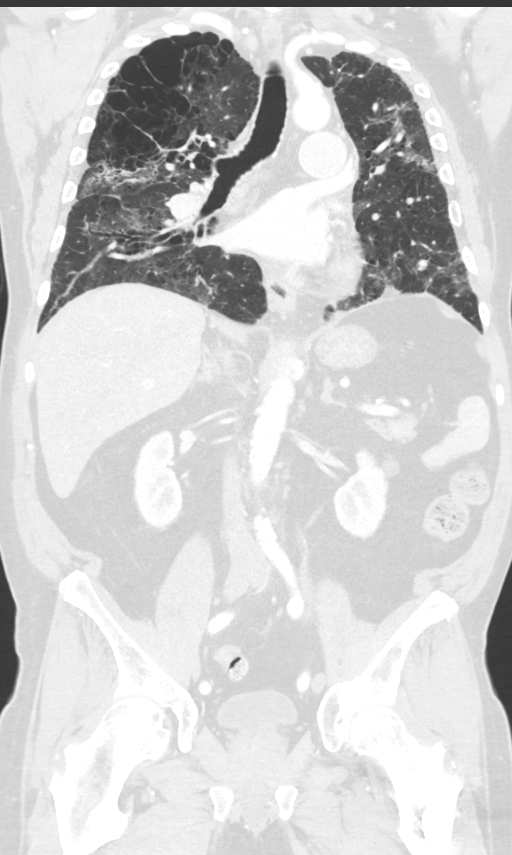

[12 of 36 positions shown; findings below may reference images not displayed]

FINDINGS: CT CHEST FINDINGS

Cardiovascular: Coronary, aortic arch, and branch vessel
atherosclerotic vascular disease. Small pericardial effusion similar
to prior. Right heart enlargement.

Mediastinum/Nodes: Severe thyroid atrophy.

A right upper paratracheal node measures 1.4 cm in short axis on
image [DATE], previously 1.1 cm. A lower right paratracheal node
measures 1.6 cm in short axis on image [DATE], formerly 1.5 cm by my
measurement. A subcarinal lymph node measures 1.6 cm in short axis
on image [DATE], formerly 1.4 cm by my measurement.

Lungs/Pleura: Similar appearance of wedge resection clips in the
left lower lobe with surrounding consolidation as on image 76/7
overall the pattern and appearance of consolidation/airspace opacity
in this vicinity is similar to prior. There is some left perihilar
volume loss unchanged from prior. Much of this likely represents
radiation therapy related findings.

Severe emphysema. Peripheral interstitial accentuation of both lungs
probably from fibrosis. Confluent scarring and confluent areas of
interstitial accentuation and some ground-glass opacities scattered
in both lungs. Some of the nodular opacity previously shown in the
right middle lobe is less confluent, probably from improved
inflammatory process. Similarly, some of the nodularity in the left
lower lobe for example medially on image 73/7 is significantly less
dense compared the prior exam and hence felt to be improved. Similar
appearance along the lingula.

Stable trace left pleural effusion.

Musculoskeletal: Nonunited left seventh rib fracture. Thoracic
spondylosis.

CT ABDOMEN PELVIS FINDINGS

Hepatobiliary: Stable 5 mm hypodense lesion in segment 2 of the
liver, highly likely to be a benign cyst or similar benign lesion.
Multiple gallstones. Otherwise unremarkable.

Pancreas: Unremarkable

Spleen: Unremarkable

Adrenals/Urinary Tract: Small bilateral hypodense renal lesions are
compatible with cysts. Subtle nodularity of the medial limb of the
right adrenal gland without overt mass.

Stomach/Bowel: Prominent stool throughout the colon favors
constipation..

Vascular/Lymphatic: Aortoiliac atherosclerotic vascular disease.

Reproductive: Mild prostatomegaly. Prior transurethral resection of
the prostate.

Other: No supplemental non-categorized findings.

Musculoskeletal: Left hip screw. Multilevel lumbar degenerative disc
disease and spondylosis causing foraminal impingement on the right
at L2-3 and on the left at L4-5.

A small supraumbilical hernia contains adipose tissue. Fatty
spermatic cords bilaterally.
IMPRESSION: 1. Stable appearance of the left perihilar and infrahilar
consolidation tracking along the wedge resection site, most of which
likely from prior radiation therapy.
2. Emphysema along with peripheral and scattered fibrosis in the
lungs. Some of the nodularity involving the more confluent areas
shown on the 02/11/2018 exam have significantly improved or reduced
in solidity, suggesting a inflammatory etiology, with multifocal
neoplastic response to therapy a less likely differential diagnostic
consideration.
3. Other imaging findings of potential clinical significance: Aortic
Atherosclerosis (8HNVP-PQS.S). Coronary atherosclerosis. Stable
small pericardial effusion. Stable trace left pleural effusion.
Right heart enlargement. Thyroid atrophy. Cholelithiasis. Prominent
stool throughout the colon favors constipation. Mild prostatomegaly.
Lumbar impingement at L2-3 and L4-5. Small supraumbilical hernia
contains adipose tissue. Emphysema (8HNVP-V92.B).

## 2019-03-05 ENCOUNTER — Inpatient Hospital Stay: Payer: Medicare HMO

## 2019-03-05 ENCOUNTER — Inpatient Hospital Stay: Payer: Medicare HMO | Attending: Internal Medicine

## 2019-03-05 ENCOUNTER — Inpatient Hospital Stay (HOSPITAL_BASED_OUTPATIENT_CLINIC_OR_DEPARTMENT_OTHER): Payer: Medicare HMO | Admitting: Internal Medicine

## 2019-03-05 ENCOUNTER — Other Ambulatory Visit: Payer: Self-pay

## 2019-03-05 ENCOUNTER — Encounter: Payer: Self-pay | Admitting: Internal Medicine

## 2019-03-05 ENCOUNTER — Telehealth: Payer: Self-pay | Admitting: Internal Medicine

## 2019-03-05 VITALS — BP 108/74 | HR 89 | Temp 98.5°F | Resp 18 | Ht 74.0 in | Wt 268.5 lb

## 2019-03-05 DIAGNOSIS — Z923 Personal history of irradiation: Secondary | ICD-10-CM | POA: Diagnosis not present

## 2019-03-05 DIAGNOSIS — Z79899 Other long term (current) drug therapy: Secondary | ICD-10-CM | POA: Diagnosis not present

## 2019-03-05 DIAGNOSIS — C3492 Malignant neoplasm of unspecified part of left bronchus or lung: Secondary | ICD-10-CM | POA: Diagnosis not present

## 2019-03-05 DIAGNOSIS — Z902 Acquired absence of lung [part of]: Secondary | ICD-10-CM | POA: Diagnosis not present

## 2019-03-05 DIAGNOSIS — Z5112 Encounter for antineoplastic immunotherapy: Secondary | ICD-10-CM

## 2019-03-05 DIAGNOSIS — Z9221 Personal history of antineoplastic chemotherapy: Secondary | ICD-10-CM | POA: Insufficient documentation

## 2019-03-05 DIAGNOSIS — C3432 Malignant neoplasm of lower lobe, left bronchus or lung: Secondary | ICD-10-CM

## 2019-03-05 DIAGNOSIS — C3490 Malignant neoplasm of unspecified part of unspecified bronchus or lung: Secondary | ICD-10-CM

## 2019-03-05 DIAGNOSIS — C7951 Secondary malignant neoplasm of bone: Secondary | ICD-10-CM

## 2019-03-05 DIAGNOSIS — C771 Secondary and unspecified malignant neoplasm of intrathoracic lymph nodes: Secondary | ICD-10-CM | POA: Diagnosis not present

## 2019-03-05 DIAGNOSIS — I4819 Other persistent atrial fibrillation: Secondary | ICD-10-CM | POA: Diagnosis not present

## 2019-03-05 DIAGNOSIS — E039 Hypothyroidism, unspecified: Secondary | ICD-10-CM

## 2019-03-05 DIAGNOSIS — Z7901 Long term (current) use of anticoagulants: Secondary | ICD-10-CM | POA: Insufficient documentation

## 2019-03-05 DIAGNOSIS — Z95828 Presence of other vascular implants and grafts: Secondary | ICD-10-CM

## 2019-03-05 LAB — CMP (CANCER CENTER ONLY)
ALT: 22 U/L (ref 0–44)
AST: 25 U/L (ref 15–41)
Albumin: 4.1 g/dL (ref 3.5–5.0)
Alkaline Phosphatase: 79 U/L (ref 38–126)
Anion gap: 8 (ref 5–15)
BUN: 19 mg/dL (ref 8–23)
CO2: 27 mmol/L (ref 22–32)
Calcium: 8.9 mg/dL (ref 8.9–10.3)
Chloride: 104 mmol/L (ref 98–111)
Creatinine: 1.24 mg/dL (ref 0.61–1.24)
GFR, Est AFR Am: >60 mL/min (ref >60)
GFR, Estimated: 59 mL/min — ABNORMAL LOW (ref >60)
Glucose, Bld: 121 mg/dL — ABNORMAL HIGH (ref 70–99)
Potassium: 4.3 mmol/L (ref 3.5–5.1)
Sodium: 139 mmol/L (ref 135–145)
Total Bilirubin: 0.8 mg/dL (ref 0.3–1.2)
Total Protein: 6.5 g/dL (ref 6.5–8.1)

## 2019-03-05 LAB — CBC WITH DIFFERENTIAL (CANCER CENTER ONLY)
Abs Immature Granulocytes: 0.03 10*3/uL (ref 0.00–0.07)
Basophils Absolute: 0 10*3/uL (ref 0.0–0.1)
Basophils Relative: 0 %
Eosinophils Absolute: 0.1 10*3/uL (ref 0.0–0.5)
Eosinophils Relative: 2 %
HCT: 45.7 % (ref 39.0–52.0)
Hemoglobin: 15.9 g/dL (ref 13.0–17.0)
Immature Granulocytes: 0 %
Lymphocytes Relative: 25 %
Lymphs Abs: 2.1 10*3/uL (ref 0.7–4.0)
MCH: 34.6 pg — ABNORMAL HIGH (ref 26.0–34.0)
MCHC: 34.8 g/dL (ref 30.0–36.0)
MCV: 99.6 fL (ref 80.0–100.0)
Monocytes Absolute: 0.7 10*3/uL (ref 0.1–1.0)
Monocytes Relative: 9 %
Neutro Abs: 5.4 10*3/uL (ref 1.7–7.7)
Neutrophils Relative %: 64 %
Platelet Count: 169 10*3/uL (ref 150–400)
RBC: 4.59 MIL/uL (ref 4.22–5.81)
RDW: 13.3 % (ref 11.5–15.5)
WBC Count: 8.5 10*3/uL (ref 4.0–10.5)
nRBC: 0 % (ref 0.0–0.2)

## 2019-03-05 LAB — TSH: TSH: 9.102 u[IU]/mL — ABNORMAL HIGH (ref 0.320–4.118)

## 2019-03-05 MED ORDER — SODIUM CHLORIDE 0.9% FLUSH
10.0000 mL | INTRAVENOUS | Status: DC | PRN
  Administered 2019-03-05: 10 mL
  Filled 2019-03-05: qty 10

## 2019-03-05 MED ORDER — SODIUM CHLORIDE 0.9 % IV SOLN
200.0000 mg | Freq: Once | INTRAVENOUS | Status: AC
  Administered 2019-03-05: 200 mg via INTRAVENOUS
  Filled 2019-03-05: qty 8

## 2019-03-05 MED ORDER — HEPARIN SOD (PORK) LOCK FLUSH 100 UNIT/ML IV SOLN
500.0000 [IU] | Freq: Once | INTRAVENOUS | Status: AC | PRN
  Administered 2019-03-05: 500 [IU]
  Filled 2019-03-05: qty 5

## 2019-03-05 MED ORDER — SODIUM CHLORIDE 0.9 % IV SOLN
Freq: Once | INTRAVENOUS | Status: AC
  Administered 2019-03-05: 12:00:00 via INTRAVENOUS
  Filled 2019-03-05: qty 250

## 2019-03-05 NOTE — Progress Notes (Signed)
[facility] Telephone:([phone]) [phone]   Fax:(336) [phone]  OFFICE PROGRESS NOTE  [name], [place] [address]  DIAGNOSIS: Metastatic non-small cell lung cancer initially diagnosed as stage IIIA (T2a, N2, M0) non-small cell lung cancer, poorly differentiated adenocarcinoma presented with left lower lobe lung mass in addition to mediastinal lymphadenopathy.  The patient was diagnosed with metastatic disease involving the left femur as well as left supraclavicular nodal metastases and right paratracheal lymphadenopathy in October 2018.  Biomarker Findings Microsatellite Status - MS-Stable Tumor Mutational Burden - TMB-Low (3 Muts/Mb) Genomic Findings For a complete list of the genes assayed, please refer to the Appendix. STK11 P28f*6 [id number] site 1263+7C>HDAXX E374* MLL2 [id number] NBN [id number] NOTCH2 [id number] splice site [id number]+2I>NDisease relevant genes with no reportable alterations: EGFR, KRAS, ALK, BRAF, MET, RET, ERBB2, ROS1   PDL1 expression 5%  PRIOR THERAPY: 1) status post wedge resection of the left lower lobe lung mass as well as AP window lymph node dissection but there was residual metastatic mediastinal lymphadenopathy that could not be resected. 2) a course of concurrent chemoradiation [place] carbopl[place]  3) status post palliative radiotherapy to the left femur metastatic bone disease. 4)  Systemic chemotherapy with carboplatin for AUC of 5, Alimta 500 mg/M2 and Keytruda 200 mg IV every 3 weeks.  First dose March 07, 2017.  Carboplatin was discontinued during cycle #2 secondary to hypersensitivity reaction. 5) status post 2 cycles of maintenance treatment with Alimta and Ketruda (pembrolizumab).  Alimta was discontinued secondary to intolerance.  CURRENT THERAPY: Maintenance treatment with single agent Ketruda (pembrolizumab) status  post 30  cycles.  INTERVAL HISTORY: Victor Huber 68o. male returns to the clinic today for follow-up visit.  The patient is feeling fine today with no concerning complaints except for mild fatigue.  He denied having any chest pain, shortness of breath, cough or hemoptysis.  He denied having any fever or chills.  He has no nausea, vomiting, diarrhea or constipation.  He denied having any headache or visual changes.  He is here today for evaluation before starting cycle #30 one of his maintenance treatment.   MEDICAL HISTORY: Past Medical History:  Diagnosis Date  . Adenocarcinoma of left lung, stage 3 ([facility]/16/2018  . Arthritis   . Atrial fibrillation ([facility]16/2018  . Atrial fibrillation ([facility] [place] . COPD (chronic obstructive pulmonary disease) ([facility]/16/2018  . History of chemotherapy   . History of radiation therapy   . Hyperlipidemia   . Hypothyroid 08/15/2016  . Longstanding persistent atrial fibrillation ([facility]/30/2018  . Pathologic fracture    left femur  . Pneumonitis   . S/P TURP 08/15/2016  . Wears glasses   . Wears hearing aid in both ears     ALLERGIES:  is allergic to carboplatin and flecainide.  MEDICATIONS:  Current Outpatient Medications  Medication Sig Dispense Refill  . Albuterol Sulfate (VENTOLIN HFA IN) Inhale into the lungs as needed.    . aMarland Kitchenixaban (ELIQUIS) 5 MG TABS tablet Take 1 tablet (5 mg total) by mouth 2 (two) times daily. 180 tablet 3  . atenolol (TENORMIN) 50 MG tablet Take 1 tablet (50 mg total) by mouth daily. 90 tablet 3  . atorvastatin (LIPITOR) 40 MG tablet Take 40 mg by mouth daily.    . citalopram (CELEXA) 40 MG tablet Take 40 mg by mouth daily.    .Marland Kitchen  ibuprofen (ADVIL,MOTRIN) 400 MG tablet Take 400 mg by mouth every 4 (four) hours as needed for moderate pain.    Marland Kitchen levothyroxine (SYNTHROID, LEVOTHROID) 150 MCG tablet Take 150 mcg by mouth daily.    . Oxycodone HCl 10 MG TABS Take one daily as needed for severe back pain. 30 tablet 0   . tamsulosin (FLOMAX) 0.4 MG CAPS capsule Take 0.4 mg daily by mouth.      No current facility-administered medications for this visit.     SURGICAL HISTORY:  Past Surgical History:  Procedure Laterality Date  . BRONCHOSCOPY  10/2014  . CARDIAC CATHETERIZATION     05/07/12  . CARDIOVERSION     x2  . COLONOSCOPY    . DG BIOPSY LUNG Left 10/2014   FNA - Adenocarcinoma   . FEMUR IM NAIL Left 02/19/2017  . FEMUR IM NAIL Left 02/19/2017   Procedure: INTRAMEDULLARY (IM) NAIL FEMORAL;  Surgeon: Marchia Bond, MD;  Location: [facility];  Service: Orthopedics;  Laterality: Left;  . IR FLUORO GUIDE PORT INSERTION RIGHT  07/03/2017  . IR US GUIDE VASC ACCESS RIGHT  07/03/2017  . LUNG CANCER SURGERY Left 12/2014   Wedge Resection   . MULTIPLE TOOTH EXTRACTIONS    . Status post TURP    . TONSILLECTOMY      REVIEW OF SYSTEMS:  A comprehensive review of systems was negative except for: Constitutional: positive for fatigue   PHYSICAL EXAMINATION: General appearance: alert, cooperative, fatigued and no distress Head: Normocephalic, without obvious abnormality, atraumatic Neck: no adenopathy, no JVD, supple, symmetrical, trachea midline and thyroid not enlarged, symmetric, no tenderness/mass/nodules Lymph nodes: Cervical, supraclavicular, and axillary nodes normal. Resp: clear to auscultation bilaterally Back: symmetric, no curvature. ROM normal. No CVA tenderness. Cardio: regular rate and rhythm, S1, S2 normal, no murmur, click, rub or gallop GI: soft, non-tender; bowel sounds normal; no masses,  no organomegaly Extremities: extremities normal, atraumatic, no cyanosis or edema  ECOG PERFORMANCE STATUS: 1 - Symptomatic but completely ambulatory  Blood pressure 108/74, pulse 89, temperature 98.5 F (36.9 C), temperature source Temporal, resp. rate 18, height 6' 2"  (1.88 m), weight 268 lb 8 oz (121.8 kg), SpO2 92 %.  LABORATORY DATA: Lab Results  Component Value Date   WBC 8.5 03/05/2019   HGB  15.9 03/05/2019   HCT 45.7 03/05/2019   MCV 99.6 03/05/2019   PLT 169 03/05/2019      Chemistry      Component Value Date/Time   NA 140 02/12/2019 1122   NA 136 04/04/2017 1141   K 4.7 02/12/2019 1122   K 4.4 04/04/2017 1141   CL 104 02/12/2019 1122   CO2 27 02/12/2019 1122   CO2 24 04/04/2017 1141   BUN 14 02/12/2019 1122   BUN 21.1 04/04/2017 1141   CREATININE 1.02 02/12/2019 1122   CREATININE 1.1 04/04/2017 1141      Component Value Date/Time   CALCIUM 8.9 02/12/2019 1122   CALCIUM 9.1 04/04/2017 1141   ALKPHOS 79 02/12/2019 1122   ALKPHOS 89 04/04/2017 1141   AST 21 02/12/2019 1122   AST 17 04/04/2017 1141   ALT 20 02/12/2019 1122   ALT 18 04/04/2017 1141   BILITOT 1.0 02/12/2019 1122   BILITOT 0.67 04/04/2017 1141       RADIOGRAPHIC STUDIES: No results found.  ASSESSMENT AND PLAN: This is a 68 years old white male with metastatic non-small cell lung cancer, adenocarcinoma with no actionable mutations and PDL 1 expression of 5% that was  initially diagnosed as stage IIIa non-small cell lung cancer, adenocarcinoma status post left lower lobectomy with lymph node dissection followed by a course of concurrent chemoradiation completed in January 2016.  The patient had evidence for disease metastasis in October 2018 with metastatic disease to the left femur as well as left supraclavicular and right paratracheal lymph nodes. The patient is currently on systemic chemotherapy initially was with carboplatin, Alimta and Keytruda.  Carboplatin was discontinued secondary to hypersensitivity reaction starting from cycle #2.   He was also treated with 3 cycles of maintenance Alimta and Ketruda (pembrolizumab) but Alimta was discontinued secondary to intolerance. He is currently undergoing treatment with maintenance Keytruda as a single agent status post 30 cycles.  The patient has been tolerating the treatment well with no concerning adverse effects. I recommended for him to proceed  with cycle #30 one of his maintenance treatment with Keytruda. I will see him back for follow-up visit in 3 weeks for evaluation with repeat CT scan of the chest, abdomen pelvis for restaging of his disease. The patient was advised to call immediately if he has any concerning symptoms in the interval. For the hypothyroidism he will continue his current treatment with levothyroxine and we will continue to monitor his TSH closely.  The patient voices understanding of current disease status and treatment options and is in agreement with the current care plan. All questions were answered. The patient knows to call the clinic with any problems, questions or concerns. We can certainly see the patient much sooner if necessary.  Disclaimer: This note was dictated with voice recognition software. Similar sounding words can inadvertently be transcribed and may not be corrected upon review.

## 2019-03-05 NOTE — Patient Instructions (Signed)
[facility] Discharge Instructions for Patients Receiving Chemotherapy  Today you received the following chemotherapy agents:  Keytruda.  To help prevent nausea and vomiting after your treatment, we encourage you to take your nausea medication as directed.   If you develop nausea and vomiting that is not controlled by your nausea medication, call the clinic.   BELOW ARE SYMPTOMS THAT SHOULD BE REPORTED IMMEDIATELY:  *FEVER GREATER THAN 100.5 F  *CHILLS WITH OR WITHOUT FEVER  NAUSEA AND VOMITING THAT IS NOT CONTROLLED WITH YOUR NAUSEA MEDICATION  *UNUSUAL SHORTNESS OF BREATH  *UNUSUAL BRUISING OR BLEEDING  TENDERNESS IN MOUTH AND THROAT WITH OR WITHOUT PRESENCE OF ULCERS  *URINARY PROBLEMS  *BOWEL PROBLEMS  UNUSUAL RASH Items with * indicate a potential emergency and should be followed up as soon as possible.  Feel free to call the clinic should you have any questions or concerns. The clinic phone number is [phone].  Please show the CHEMO ALERT CARD at check-in to the Emergency Department and triage nurse.    

## 2019-03-05 NOTE — Telephone Encounter (Signed)
Patient decline avs and calendar he said he will get contrast from ct

## 2019-03-06 ENCOUNTER — Other Ambulatory Visit (INDEPENDENT_AMBULATORY_CARE_PROVIDER_SITE_OTHER): Payer: Medicare HMO

## 2019-03-06 DIAGNOSIS — Z8639 Personal history of other endocrine, nutritional and metabolic disease: Secondary | ICD-10-CM

## 2019-03-06 DIAGNOSIS — R7309 Other abnormal glucose: Secondary | ICD-10-CM

## 2019-03-06 DIAGNOSIS — Z125 Encounter for screening for malignant neoplasm of prostate: Secondary | ICD-10-CM

## 2019-03-06 DIAGNOSIS — E785 Hyperlipidemia, unspecified: Secondary | ICD-10-CM

## 2019-03-06 LAB — COMPREHENSIVE METABOLIC PANEL
ALT: 19 U/L (ref 0–53)
AST: 20 U/L (ref 0–37)
Albumin: 4.1 g/dL (ref 3.5–5.2)
Alkaline Phosphatase: 72 U/L (ref 39–117)
BUN: 20 mg/dL (ref 6–23)
CO2: 28 mEq/L (ref 19–32)
Calcium: 9.2 mg/dL (ref 8.4–10.5)
Chloride: 104 mEq/L (ref 96–112)
Creatinine, Ser: 1.08 mg/dL (ref 0.40–1.50)
GFR: 67.51 mL/min (ref >60.00)
Glucose, Bld: 120 mg/dL — ABNORMAL HIGH (ref 70–99)
Potassium: 4.3 mEq/L (ref 3.5–5.1)
Sodium: 140 mEq/L (ref 135–145)
Total Bilirubin: 0.9 mg/dL (ref 0.2–1.2)
Total Protein: 6.1 g/dL (ref 6.0–8.3)

## 2019-03-06 LAB — URINALYSIS, ROUTINE W REFLEX MICROSCOPIC
Bilirubin Urine: NEGATIVE
Hgb urine dipstick: NEGATIVE
Ketones, ur: NEGATIVE
Leukocytes,Ua: NEGATIVE
Nitrite: NEGATIVE
RBC / HPF: NONE SEEN (ref 0–?)
Specific Gravity, Urine: 1.025 (ref 1.000–1.030)
Total Protein, Urine: NEGATIVE
Urine Glucose: NEGATIVE
Urobilinogen, UA: 0.2 (ref 0.0–1.0)
pH: 5.5 (ref 5.0–8.0)

## 2019-03-06 LAB — PSA: PSA: 1.64 ng/mL (ref 0.10–4.00)

## 2019-03-06 LAB — LIPID PANEL
Cholesterol: 122 mg/dL (ref 0–200)
HDL: 35.9 mg/dL — ABNORMAL LOW (ref >39.00)
NonHDL: 86.2
Total CHOL/HDL Ratio: 3
Triglycerides: 203 mg/dL — ABNORMAL HIGH (ref 0.0–149.0)
VLDL: 40.6 mg/dL — ABNORMAL HIGH (ref 0.0–40.0)

## 2019-03-06 LAB — CBC
HCT: 47.4 % (ref 39.0–52.0)
Hemoglobin: 16.2 g/dL (ref 13.0–17.0)
MCHC: 34.2 g/dL (ref 30.0–36.0)
MCV: 101.6 fl — ABNORMAL HIGH (ref 78.0–100.0)
Platelets: 159 10*3/uL (ref 150.0–400.0)
RBC: 4.66 Mil/uL (ref 4.22–5.81)
RDW: 13.8 % (ref 11.5–15.5)
WBC: 6.4 10*3/uL (ref 4.0–10.5)

## 2019-03-06 LAB — HEMOGLOBIN A1C: Hgb A1c MFr Bld: 5.4 % (ref 4.6–6.5)

## 2019-03-06 LAB — TSH: TSH: 5.66 u[IU]/mL — ABNORMAL HIGH (ref 0.35–4.50)

## 2019-03-06 LAB — LDL CHOLESTEROL, DIRECT: Direct LDL: 55 mg/dL

## 2019-03-09 ENCOUNTER — Other Ambulatory Visit: Payer: Self-pay

## 2019-03-10 ENCOUNTER — Encounter: Payer: Self-pay | Admitting: Family Medicine

## 2019-03-10 ENCOUNTER — Ambulatory Visit (INDEPENDENT_AMBULATORY_CARE_PROVIDER_SITE_OTHER): Payer: Medicare HMO | Admitting: Family Medicine

## 2019-03-10 VITALS — BP 108/72 | HR 67 | Temp 97.5°F | Ht 74.0 in | Wt 268.2 lb

## 2019-03-10 DIAGNOSIS — C7951 Secondary malignant neoplasm of bone: Secondary | ICD-10-CM

## 2019-03-10 DIAGNOSIS — Z8659 Personal history of other mental and behavioral disorders: Secondary | ICD-10-CM

## 2019-03-10 DIAGNOSIS — E039 Hypothyroidism, unspecified: Secondary | ICD-10-CM | POA: Diagnosis not present

## 2019-03-10 DIAGNOSIS — M545 Low back pain, unspecified: Secondary | ICD-10-CM

## 2019-03-10 DIAGNOSIS — E785 Hyperlipidemia, unspecified: Secondary | ICD-10-CM

## 2019-03-10 DIAGNOSIS — N401 Enlarged prostate with lower urinary tract symptoms: Secondary | ICD-10-CM | POA: Diagnosis not present

## 2019-03-10 DIAGNOSIS — Z23 Encounter for immunization: Secondary | ICD-10-CM | POA: Diagnosis not present

## 2019-03-10 DIAGNOSIS — C3490 Malignant neoplasm of unspecified part of unspecified bronchus or lung: Secondary | ICD-10-CM | POA: Diagnosis not present

## 2019-03-10 DIAGNOSIS — G8929 Other chronic pain: Secondary | ICD-10-CM

## 2019-03-10 DIAGNOSIS — R3911 Hesitancy of micturition: Secondary | ICD-10-CM

## 2019-03-10 MED ORDER — OXYCODONE HCL 10 MG PO TABS: ORAL_TABLET | ORAL | 0 refills | Status: DC

## 2019-03-10 MED ORDER — CITALOPRAM HYDROBROMIDE 40 MG PO TABS: 40.0000 mg | ORAL_TABLET | Freq: Every day | ORAL | 1 refills | Status: DC

## 2019-03-10 MED ORDER — LEVOTHYROXINE SODIUM 25 MCG PO TABS: 25.0000 ug | ORAL_TABLET | Freq: Every day | ORAL | 0 refills | Status: DC

## 2019-03-10 MED ORDER — TAMSULOSIN HCL 0.4 MG PO CAPS: 0.4000 mg | ORAL_CAPSULE | Freq: Every day | ORAL | 4 refills | Status: DC

## 2019-03-10 MED ORDER — LEVOTHYROXINE SODIUM 175 MCG PO TABS: 175.0000 ug | ORAL_TABLET | Freq: Every day | ORAL | 0 refills | Status: DC

## 2019-03-10 MED ORDER — ATORVASTATIN CALCIUM 40 MG PO TABS: 40.0000 mg | ORAL_TABLET | Freq: Every day | ORAL | 2 refills | Status: DC

## 2019-03-10 NOTE — Progress Notes (Signed)
Established Patient Office Visit  Subjective:  Patient ID: Victor Huber, male    DOB: [birth year]  Age: 68 y.o. MRN: [id number]  CC:  Chief Complaint  Patient presents with  . Annual Exam  . Medication Refill    x years worth, Atorvastatin, celexa, and oxycodone, flomax    HPI Victor Huber presents for medication refills for multiple problems.  He has been on Celexa for 5 years since his diagnosis of lung cancer it is worked well for him.  pH tube from a couple weeks ago was 0.  He would like to stay on the medication.  Has been taking Lipitor 40 mg for his cholesterol without issue.  TSH remains elevated.  He has a large number of 150 mcg pills left.  He has if I can write a prescription for the 25 mcg pills to take with them.  Using oxycodone intermittently for severe lower back pain as needed.  Recent metastasis of his lung cancer to the left femur.  He has been taking tamsulosin for an extended period of time as well.  He says that his urine flow is good while he is taking the medicine.  Past Medical History:  Diagnosis Date  . Adenocarcinoma of left lung, stage 3 ([place]) 08/15/2016  . Arthritis   . Atrial fibrillation ([place]) 08/15/2016  . Atrial fibrillation ([place])   . COPD (chronic obstructive pulmonary disease) ([place]) 08/15/2016  . History of chemotherapy   . History of radiation therapy   . Hyperlipidemia   . Hypothyroid 08/15/2016  . Longstanding persistent atrial fibrillation ([place]) 08/29/2016  . Pathologic fracture    left femur  . Pneumonitis   . S/P TURP 08/15/2016  . Wears glasses   . Wears hearing aid in both ears     Past Surgical History:  Procedure Laterality Date  . BRONCHOSCOPY  10/2014  . CARDIAC CATHETERIZATION     05/07/12  . CARDIOVERSION     x2  . COLONOSCOPY    . DG BIOPSY LUNG Left 10/2014   FNA - Adenocarcinoma   . FEMUR IM NAIL Left 02/19/2017  . FEMUR IM NAIL Left 02/19/2017   Procedure: INTRAMEDULLARY (IM) NAIL FEMORAL;  Surgeon: Marchia Bond, MD;  Location: [facility];  Service: Orthopedics;  Laterality: Left;  . IR FLUORO GUIDE PORT INSERTION RIGHT  07/03/2017  . IR US GUIDE VASC ACCESS RIGHT  07/03/2017  . LUNG CANCER SURGERY Left 12/2014   Wedge Resection   . MULTIPLE TOOTH EXTRACTIONS    . Status post TURP    . TONSILLECTOMY      Family History  Problem Relation Age of Onset  . Breast cancer Mother   . Breast cancer Maternal Aunt   . Breast cancer Maternal Aunt   . Lung disease Neg Hx     Social History   Socioeconomic History  . Marital status: Married    Spouse name: Not on file  . Number of children: Not on file  . Years of education: Not on file  . Highest education level: Not on file  Occupational History  . Not on file  Social Needs  . Financial resource strain: Not on file  . Food insecurity    Worry: Not on file    Inability: Not on file  . Transportation needs    Medical: Not on file    Non-medical: Not on file  Tobacco Use  . Smoking status: Former Smoker    Packs/day: 1.00  Years: 50.00    Pack years: 50.00    Types: Cigarettes    Quit date: 08/15/2012    Years since quitting: 6.5  . Smokeless tobacco: Never Used  Substance and Sexual Activity  . Alcohol use: No  . Drug use: No  . Sexual activity: Not on file  Lifestyle  . Physical activity    Days per week: Not on file    Minutes per session: Not on file  . Stress: Not on file  Relationships  . Social Herbalist on phone: Not on file    Gets together: Not on file    Attends religious service: Not on file    Active member of club or organization: Not on file    Attends meetings of clubs or organizations: Not on file    Relationship status: Not on file  . Intimate partner violence    Fear of current or ex partner: Not on file    Emotionally abused: Not on file    Physically abused: Not on file    Forced sexual activity: Not on file  Other Topics Concern  . Not on file  Social History Narrative   [place]  [facility] (09/26/16):   Originally from [place]. Moved to [facility] February 2018. Always lived in [place]. Moved to be closer to children & grandchildren. No international travel. 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 No mold exposure. No bird exposure or hot tub exposure. Enjoys reading. Previously enjoyed wood working with domestic woods.     Outpatient Medications Prior to Visit  Medication Sig Dispense Refill  . Albuterol Sulfate (VENTOLIN HFA IN) Inhale into the lungs as needed.    Marland Kitchen apixaban (ELIQUIS) 5 MG TABS tablet Take 1 tablet (5 mg total) by mouth 2 (two) times daily. 180 tablet 3  . atenolol (TENORMIN) 50 MG tablet Take 1 tablet (50 mg total) by mouth daily. 90 tablet 3  . ibuprofen (ADVIL,MOTRIN) 400 MG tablet Take 400 mg by mouth every 4 (four) hours as needed for moderate pain.    Marland Kitchen atorvastatin (LIPITOR) 40 MG tablet Take 40 mg by mouth daily.    . citalopram (CELEXA) 40 MG tablet Take 40 mg by mouth daily.    . Oxycodone HCl 10 MG TABS Take one daily as needed for severe back pain. 30 tablet 0  . tamsulosin (FLOMAX) 0.4 MG CAPS capsule Take 0.4 mg daily by mouth.     . levothyroxine (SYNTHROID) 175 MCG tablet Take 1 tablet (175 mcg total) by mouth daily before breakfast. (Patient not taking: Reported on 03/10/2019) 90 tablet 0   No facility-administered medications prior to visit.     Allergies  Allergen Reactions  . Carboplatin Itching, Nausea And Vomiting and Other (See Comments)    Flushing  . Flecainide Hypertension    CAUSED HEART ISSUES     ROS Review of Systems  Constitutional: Negative.   HENT: Negative.   Respiratory: Negative.  Negative for chest tightness and shortness of breath.   Cardiovascular: Positive for palpitations. Negative for chest pain.  Gastrointestinal: Negative.   Endocrine: Negative for cold intolerance, heat intolerance, polyphagia and polyuria.  Genitourinary: Negative for  difficulty urinating, frequency and urgency.  Musculoskeletal: Positive for back pain.  Skin: Negative for color change and pallor.  Neurological: Negative for light-headedness and headaches.  Hematological: Negative.   Psychiatric/Behavioral: Negative for dysphoric mood.      Objective:  Physical Exam  Constitutional: He is oriented to person, place, and time. He appears well-developed and well-nourished. No distress.  HENT:  Head: Normocephalic and atraumatic.  Right Ear: External ear normal.  Left Ear: External ear normal.  Eyes: Conjunctivae are normal. Right eye exhibits no discharge. Left eye exhibits no discharge. No scleral icterus.  Neck: No JVD present. No tracheal deviation present.  Cardiovascular: An irregularly irregular rhythm present.  Pulmonary/Chest: Effort normal. No stridor.  Neurological: He is alert and oriented to person, place, and time.  Skin: He is not diaphoretic.  Psychiatric: He has a normal mood and affect. His speech is normal and behavior is normal.    BP 108/72   Pulse 67   Temp (!) 97.5 F (36.4 C)   Ht 6' 2"  (1.88 m)   Wt 268 lb 3.2 oz (121.7 kg)   SpO2 95%   BMI 34.43 kg/m  Wt Readings from Last 3 Encounters:  03/10/19 268 lb 3.2 oz (121.7 kg)  03/05/19 268 lb 8 oz (121.8 kg)  02/12/19 264 lb 11.2 oz (120.1 kg)     Health Maintenance Due  Topic Date Due  . Hepatitis C Screening  16-Feb-1951  . COLONOSCOPY  09/29/1998    There are no preventive care reminders to display for this patient.  Lab Results  Component Value Date   TSH 5.66 (H) 03/06/2019   Lab Results  Component Value Date   WBC 6.4 03/06/2019   HGB 16.2 03/06/2019   HCT 47.4 03/06/2019   MCV 101.6 (H) 03/06/2019   PLT 159.0 03/06/2019   Lab Results  Component Value Date   NA 140 03/06/2019   K 4.3 03/06/2019   CHLORIDE 102 04/04/2017   CO2 28 03/06/2019   GLUCOSE 120 (H) 03/06/2019   BUN 20 03/06/2019   CREATININE 1.08 03/06/2019   BILITOT 0.9  03/06/2019   ALKPHOS 72 03/06/2019   AST 20 03/06/2019   ALT 19 03/06/2019   PROT 6.1 03/06/2019   ALBUMIN 4.1 03/06/2019   CALCIUM 9.2 03/06/2019   ANIONGAP 8 03/05/2019   EGFR >60 04/04/2017   GFR 67.51 03/06/2019   Lab Results  Component Value Date   CHOL 122 03/06/2019   Lab Results  Component Value Date   HDL 35.90 (L) 03/06/2019   Lab Results  Component Value Date   LDLCALC 34 12/19/2016   Lab Results  Component Value Date   TRIG 203.0 (H) 03/06/2019   Lab Results  Component Value Date   CHOLHDL 3 03/06/2019   Lab Results  Component Value Date   HGBA1C 5.4 03/06/2019      Assessment & Plan:   Problem List Items Addressed This Visit      Endocrine   Hypothyroid   Relevant Medications   levothyroxine (SYNTHROID) 25 MCG tablet     Musculoskeletal and Integument   Metastasis to bone (HCC)   Relevant Medications   Oxycodone HCl 10 MG TABS     Genitourinary   Benign prostatic hyperplasia with urinary hesitancy   Relevant Medications   tamsulosin (FLOMAX) 0.4 MG CAPS capsule     Other   Hyperlipidemia   Relevant Medications   atorvastatin (LIPITOR) 40 MG tablet   Chronic low back pain   Relevant Medications   Oxycodone HCl 10 MG TABS   History of depression   Relevant Medications   citalopram (CELEXA) 40 MG tablet   Need for influenza vaccination - Primary   Relevant Orders   Flu Vaccine QUAD High  Dose(Fluad) (Completed)      Meds ordered this encounter  Medications  . atorvastatin (LIPITOR) 40 MG tablet    Sig: Take 1 tablet (40 mg total) by mouth daily.    Dispense:  90 tablet    Refill:  2  . citalopram (CELEXA) 40 MG tablet    Sig: Take 1 tablet (40 mg total) by mouth daily.    Dispense:  90 tablet    Refill:  1  . Oxycodone HCl 10 MG TABS    Sig: Take one daily as needed for severe back pain.    Dispense:  30 tablet    Refill:  0  . tamsulosin (FLOMAX) 0.4 MG CAPS capsule    Sig: Take 1 capsule (0.4 mg total) by mouth daily.     Dispense:  90 capsule    Refill:  4  . levothyroxine (SYNTHROID) 25 MCG tablet    Sig: Take 1 tablet (25 mcg total) by mouth daily before breakfast.    Dispense:  90 tablet    Refill:  0    Follow-up: Return in about 3 months (around 06/08/2019).   Patient will take the 25 mcg levothyroxine with his existing 150 mcg pills.   [name], MD

## 2019-03-10 NOTE — Addendum Note (Signed)
Addended by: Jon Billings on: 03/10/2019 08:05 AM   Modules accepted: Orders

## 2019-03-10 NOTE — Patient Instructions (Signed)
Influenza Virus Vaccine injection What is this medicine? INFLUENZA VIRUS VACCINE (in floo EN zuh VAHY ruhs vak SEEN) helps to reduce the risk of getting influenza also known as the flu. The vaccine only helps protect you against some strains of the flu. This medicine may be used for other purposes; ask your health care provider or pharmacist if you have questions. COMMON BRAND NAME(S): Afluria, Afluria Quadrivalent, Agriflu, Alfuria, FLUAD, Fluarix, Fluarix Quadrivalent, Flublok, Flublok Quadrivalent, FLUCELVAX, Flulaval, Fluvirin, Fluzone, Fluzone High-Dose, Fluzone Intradermal What should I tell my health care provider before I take this medicine? They need to know if you have any of these conditions:  bleeding disorder like hemophilia  fever or infection  Guillain-Barre syndrome or other neurological problems  immune system problems  infection with the human immunodeficiency virus (HIV) or AIDS  low blood platelet counts  multiple sclerosis  an unusual or allergic reaction to influenza virus vaccine, latex, other medicines, foods, dyes, or preservatives. Different brands of vaccines contain different allergens. Some may contain latex or eggs. Talk to your doctor about your allergies to make sure that you get the right vaccine.  pregnant or trying to get pregnant  breast-feeding How should I use this medicine? This vaccine is for injection into a muscle or under the skin. It is given by a health care professional. A copy of Vaccine Information Statements will be given before each vaccination. Read this sheet carefully each time. The sheet may change frequently. Talk to your healthcare provider to see which vaccines are right for you. Some vaccines should not be used in all age groups. Overdosage: If you think you have taken too much of this medicine contact a poison control center or emergency room at once. NOTE: This medicine is only for you. Do not share this medicine with  others. What if I miss a dose? This does not apply. What may interact with this medicine?  chemotherapy or radiation therapy  medicines that lower your immune system like etanercept, anakinra, infliximab, and adalimumab  medicines that treat or prevent blood clots like warfarin  phenytoin  steroid medicines like prednisone or cortisone  theophylline  vaccines This list may not describe all possible interactions. Give your health care provider a list of all the medicines, herbs, non-prescription drugs, or dietary supplements you use. Also tell them if you smoke, drink alcohol, or use illegal drugs. Some items may interact with your medicine. What should I watch for while using this medicine? Report any side effects that do not go away within 3 days to your doctor or health care professional. Call your health care provider if any unusual symptoms occur within 6 weeks of receiving this vaccine. You may still catch the flu, but the illness is not usually as bad. You cannot get the flu from the vaccine. The vaccine will not protect against colds or other illnesses that may cause fever. The vaccine is needed every year. What side effects may I notice from receiving this medicine? Side effects that you should report to your doctor or health care professional as soon as possible:  allergic reactions like skin rash, itching or hives, swelling of the face, lips, or tongue Side effects that usually do not require medical attention (report to your doctor or health care professional if they continue or are bothersome):  fever  headache  muscle aches and pains  pain, tenderness, redness, or swelling at the injection site  tiredness This list may not describe all possible side effects. Call your   doctor for medical advice about side effects. You may report side effects to FDA at 1-800-FDA-1088. Where should I keep my medicine? The vaccine will be given by a health care professional in a  clinic, pharmacy, doctor's office, or other health care setting. You will not be given vaccine doses to store at home. NOTE: This sheet is a summary. It may not cover all possible information. If you have questions about this medicine, talk to your doctor, pharmacist, or health care provider.  2020 Elsevier/Gold Standard (2018-02-11 08:45:43)  

## 2019-03-11 ENCOUNTER — Ambulatory Visit (INDEPENDENT_AMBULATORY_CARE_PROVIDER_SITE_OTHER): Payer: Medicare HMO | Admitting: Emergency Medicine

## 2019-03-11 ENCOUNTER — Encounter: Payer: Self-pay | Admitting: Emergency Medicine

## 2019-03-11 ENCOUNTER — Other Ambulatory Visit: Payer: Self-pay

## 2019-03-11 VITALS — BP 106/68 | HR 88 | Ht 74.0 in | Wt 267.2 lb

## 2019-03-11 DIAGNOSIS — J441 Chronic obstructive pulmonary disease with (acute) exacerbation: Secondary | ICD-10-CM

## 2019-03-11 MED ORDER — STIOLTO RESPIMAT 2.5-2.5 MCG/ACT IN AERS: 2.0000 | INHALATION_SPRAY | Freq: Every day | RESPIRATORY_TRACT | 0 refills | Status: DC

## 2019-03-11 NOTE — Assessment & Plan Note (Signed)
He had moderately severe obstruction on pulmonary function testing back in 2018, has been able to get away with just albuterol as needed.  Now with progressive dyspnea, progressive symptoms and exertional limitation.  He will ultimately need repeat pulmonary function testing but uncomfortable starting him on scheduled bronchodilator based on his past results.  We will try starting Stiolto to see if he gets benefit.  If so continue going forward.  He will need repeat PFT, a walking oximetry next time.  He would very much like to avoid getting back on oxygen (he used it in [place] after his original lung cancer surgery and treatment).

## 2019-03-11 NOTE — Patient Instructions (Signed)
We will try starting Stiolto 2 puffs once daily to see if this helps your breathing.  Please keep track with the benefit.  If so then we will plan to continue it going forward.  Call our office to let us know. We will refill your Ventolin.  Please use 2 puffs up to every 4 hours if you needed for shortness of breath, chest tightness, wheezing. We will perform a walking oximetry on room air at your next visit to ensure that your oxygen levels are adequate. We will repeat your pulmonary function testing at some point in the future. If your cough and nasal congestion worsen we could consider starting allergy medication sometime in the future. Follow with Dr. Julien Nordmann as planned Follow with Dr Lamonte Sakai in 3 months or sooner if you have any problems

## 2019-03-11 NOTE — Progress Notes (Signed)
Subjective:    Patient ID: Victor Huber, male    DOB: 09/29/1950, 68 y.o.   MRN: [id number]  HPI [name] is a 68 year old former smoker (50 pack years) with a history of COPD and stage IV non-small cell lung cancer, adenoCA.  He underwent left lower lobe wedge resection and chemoradiation, subsequently on Keytruda.  Most recent chest imaging 01/01/2019, reviewed and shows stable mediastinal adenopathy, postoperative and postradiation changes in the left inferior hilum, no evidence of new disease.  He is currently managed on albuterol which he uses as needed.  He uses this rarely.   He may be coughing more, non-productive. He is having more dyspnea, happens with bending. He can walk around the block, exert but has to stop to rest. He has some wheeze in the am, coughs up some mucous in the am. Has some nasal gtt at night.    Review of Systems  Constitutional: Negative for fever and unexpected weight change.  HENT: Negative for congestion, dental problem, ear pain, nosebleeds, postnasal drip, rhinorrhea, sinus pressure, sneezing, sore throat and trouble swallowing.   Eyes: Negative for redness and itching.  Respiratory: Positive for cough and shortness of breath. Negative for chest tightness and wheezing.   Cardiovascular: Positive for palpitations. Negative for leg swelling.  Gastrointestinal: Negative for nausea and vomiting.  Genitourinary: Negative for dysuria.  Musculoskeletal: Negative for joint swelling.  Skin: Positive for rash.  Neurological: Negative for headaches.  Hematological: Does not bruise/bleed easily.  Psychiatric/Behavioral: Negative for dysphoric mood. The patient is not nervous/anxious.     Past Medical History:  Diagnosis Date  . Adenocarcinoma of left lung, stage 3 ([place]) 08/15/2016  . Arthritis   . Atrial fibrillation ([place]) 08/15/2016  . Atrial fibrillation ([place])   . COPD (chronic obstructive pulmonary disease) ([place]) 08/15/2016  . History of chemotherapy    . History of radiation therapy   . Hyperlipidemia   . Hypothyroid 08/15/2016  . Longstanding persistent atrial fibrillation ([place]) 08/29/2016  . Pathologic fracture    left femur  . Pneumonitis   . S/P TURP 08/15/2016  . Wears glasses   . Wears hearing aid in both ears      Family History  Problem Relation Age of Onset  . Breast cancer Mother   . Breast cancer Maternal Aunt   . Breast cancer Maternal Aunt   . Lung disease Neg Hx      Social History   Socioeconomic History  . Marital status: Married    Spouse name: Not on file  . Number of children: Not on file  . Years of education: Not on file  . Highest education level: Not on file  Occupational History  . Not on file  Social Needs  . Financial resource strain: Not on file  . Food insecurity    Worry: Not on file    Inability: Not on file  . Transportation needs    Medical: Not on file    Non-medical: Not on file  Tobacco Use  . Smoking status: Former Smoker    Packs/day: 1.00    Years: 50.00    Pack years: 50.00    Types: Cigarettes    Quit date: 08/15/2012    Years since quitting: 6.5  . Smokeless tobacco: Never Used  Substance and Sexual Activity  . Alcohol use: No  . Drug use: No  . Sexual activity: Not on file  Lifestyle  . Physical activity    Days per week: Not on file  Minutes per session: Not on file  . Stress: Not on file  Relationships  . Social Herbalist on phone: Not on file    Gets together: Not on file    Attends religious service: Not on file    Active member of club or organization: Not on file    Attends meetings of clubs or organizations: Not on file    Relationship status: Not on file  . Intimate partner violence    Fear of current or ex partner: Not on file    Emotionally abused: Not on file    Physically abused: Not on file    Forced sexual activity: Not on file  Other Topics Concern  . Not on file  Social History Narrative   [place] [facility] (09/26/16):    Originally from [place]. Moved to [facility] February 2018. Always lived in [place]. Moved to be closer to children & grandchildren. No international travel. 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 No mold exposure. No bird exposure or hot tub exposure. Enjoys [place]. Previously enjoyed wood working with domestic woods.   Lots of sawdust exposure.  No military  Allergies  Allergen Reactions  . Carboplatin Itching, Nausea And Vomiting and Other (See Comments)    Flushing  . Flecainide Hypertension    CAUSED HEART ISSUES      Outpatient Medications Prior to Visit  Medication Sig Dispense Refill  . Albuterol Sulfate (VENTOLIN HFA IN) Inhale into the lungs as needed.    Marland Kitchen apixaban (ELIQUIS) 5 MG TABS tablet Take 1 tablet (5 mg total) by mouth 2 (two) times daily. 180 tablet 3  . atenolol (TENORMIN) 50 MG tablet Take 1 tablet (50 mg total) by mouth daily. 90 tablet 3  . atorvastatin (LIPITOR) 40 MG tablet Take 1 tablet (40 mg total) by mouth daily. 90 tablet 2  . citalopram (CELEXA) 40 MG tablet Take 1 tablet (40 mg total) by mouth daily. 90 tablet 1  . ibuprofen (ADVIL,MOTRIN) 400 MG tablet Take 400 mg by mouth every 4 (four) hours as needed for moderate pain.    Marland Kitchen levothyroxine (SYNTHROID) 150 MCG tablet Take 150 mcg by mouth daily before breakfast.    . levothyroxine (SYNTHROID) 25 MCG tablet Take 1 tablet (25 mcg total) by mouth daily before breakfast. 90 tablet 0  . Oxycodone HCl 10 MG TABS Take one daily as needed for severe back pain. 30 tablet 0  . tamsulosin (FLOMAX) 0.4 MG CAPS capsule Take 1 capsule (0.4 mg total) by mouth daily. 90 capsule 4   No facility-administered medications prior to visit.          Objective:   Physical Exam  Today's Vitals   03/11/19 1609  BP: 106/68  Pulse: 88  SpO2: 93%  Weight: 267 lb 3.2 oz (121.2 kg)  Height: 6\' 2"  (1.88 m)   Body mass index is 34.31 kg/m.   Gen: Pleasant,  overweight man, in no distress,  normal affect  ENT: No lesions,  mouth clear,  oropharynx clear, no postnasal drip  Neck: No JVD, no stridor  Lungs: No use of accessory muscles, good air movement, no crackles, no wheezing on a normal breath but he does have an end expiratory wheeze on forced expiration  Cardiovascular: RRR, heart sounds normal, no murmur or gallops, no peripheral edema  Musculoskeletal: No deformities, no cyanosis or clubbing  Neuro: alert, awake, non focal  Skin: Warm, no lesions  or rash     Assessment & Plan:  Adenocarcinoma of left lung, stage 4 (HCC) On maintenance keytruda, following with Dr Julien Nordmann.   Chronic obstructive pulmonary disease ([place]) He had moderately severe obstruction on pulmonary function testing back in 2018, has been able to get away with just albuterol as needed.  Now with progressive dyspnea, progressive symptoms and exertional limitation.  He will ultimately need repeat pulmonary function testing but uncomfortable starting him on scheduled bronchodilator based on his past results.  We will try starting Stiolto to see if he gets benefit.  If so continue going forward.  He will need repeat PFT, a walking oximetry next time.  He would very much like to avoid getting back on oxygen (he used it in [place] after his original lung cancer surgery and treatment).    Baltazar Apo, MD, PhD 03/11/2019, 4:41 PM [facility] [phone] or if no answer [phone]

## 2019-03-11 NOTE — Assessment & Plan Note (Signed)
On maintenance keytruda, following with Dr Julien Nordmann.

## 2019-03-16 ENCOUNTER — Other Ambulatory Visit: Payer: Self-pay | Admitting: *Deleted

## 2019-03-16 MED ORDER — ALBUTEROL SULFATE HFA 108 (90 BASE) MCG/ACT IN AERS: 1.0000 | INHALATION_SPRAY | RESPIRATORY_TRACT | 3 refills | Status: DC | PRN

## 2019-03-19 ENCOUNTER — Ambulatory Visit (INDEPENDENT_AMBULATORY_CARE_PROVIDER_SITE_OTHER): Payer: Medicare HMO | Admitting: Cardiovascular Disease

## 2019-03-19 ENCOUNTER — Encounter: Payer: Self-pay | Admitting: Cardiovascular Disease

## 2019-03-19 ENCOUNTER — Other Ambulatory Visit: Payer: Self-pay

## 2019-03-19 VITALS — BP 114/62 | HR 104 | Temp 97.1°F | Ht 74.0 in | Wt 267.0 lb

## 2019-03-19 DIAGNOSIS — E785 Hyperlipidemia, unspecified: Secondary | ICD-10-CM

## 2019-03-19 DIAGNOSIS — I4819 Other persistent atrial fibrillation: Secondary | ICD-10-CM | POA: Diagnosis not present

## 2019-03-19 NOTE — Progress Notes (Signed)
Cardiology Office Note   Date:  03/19/2019   ID:  Victor Huber, DOB Dec 06, 1950, MRN [id number]  PCP:  Victor Maw, MD  Cardiologist:   Victor Latch, MD   No chief complaint on file.    History of Present Illness: Victor Huber is a 68 y.o. male with persistent atrial fibrillation, COPD, hypothyroidism and metastatic non-small cell lung cancer s/p resection and on chemotherapy and prior tobacco abuse here for follow up.  He moved from [place] 05/2016 and established care with Dr. Julien Huber.  He was diagnosed with atrial fibrillation in 2012.  He has been chronically in atrial fibrillation lately.  In the past he was on amiodarone. However this was discontinued when he was diagnosed with lung cancer in 2016. After that he was switched to sotalol but failed subsequent DCCV.  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  Victor Huber reported fatigue and shortness of breath. He was switched from metoprolol to atenolol, given that he previously felt better on this medication.  He was also referred to the atrial fibrillation clinic where he discussed using dofetilide, amiodarone, and ablation. He was not felt to be a good candidate for ablation declined antiarrhythmics due to cost.  Victor Huber continues with [facility] therapy.  He notes that his shortness of breath is getting progressively worse.  He tries to get some exercise by walking but is limited by dyspnea.  He has no chest pain, edema, or orthopnea.  He gets short of breath with minimal movement such as bending over or up a few stairs.  He recently started a new inhaler but doesn't think it helps.  At home his BP is generally controlled.  HR is <100 bpm.   Past Medical History:  Diagnosis Date  . Adenocarcinoma of left lung, stage 3 ([place]) 08/15/2016  . Arthritis   . Atrial fibrillation ([place]) 08/15/2016  . Atrial fibrillation ([place])   . COPD (chronic obstructive pulmonary disease) ([place])  08/15/2016  . History of chemotherapy   . History of radiation therapy   . Hyperlipidemia   . Hypothyroid 08/15/2016  . Longstanding persistent atrial fibrillation ([place]) 08/29/2016  . Pathologic fracture    left femur  . Pneumonitis   . S/P TURP 08/15/2016  . Wears glasses   . Wears hearing aid in both ears     Past Surgical History:  Procedure Laterality Date  . BRONCHOSCOPY  10/2014  . CARDIAC CATHETERIZATION     05/07/12  . CARDIOVERSION     x2  . COLONOSCOPY    . DG BIOPSY LUNG Left 10/2014   FNA - Adenocarcinoma   . FEMUR IM NAIL Left 02/19/2017  . FEMUR IM NAIL Left 02/19/2017   Procedure: INTRAMEDULLARY (IM) NAIL FEMORAL;  Surgeon: Marchia Bond, MD;  Location: [facility];  Service: Orthopedics;  Laterality: Left;  . IR FLUORO GUIDE PORT INSERTION RIGHT  07/03/2017  . IR US GUIDE VASC ACCESS RIGHT  07/03/2017  . LUNG CANCER SURGERY Left 12/2014   [place]edge Resection   . MULTIPLE TOOTH EXTRACTIONS    . Status post TURP    . TONSILLECTOMY       Current Outpatient Medications  Medication Sig Dispense Refill  . albuterol (VENTOLIN HFA) 108 (90 Base) MCG/ACT inhaler Inhale 1-2 puffs into the lungs every 4 (four) hours as needed for wheezing or shortness of breath. 3 month supply 24 g 3  . apixaban (ELIQUIS) 5 MG TABS tablet Take 1 tablet (5  mg total) by mouth 2 (two) times daily. 180 tablet 3  . atenolol (TENORMIN) 50 MG tablet Take 1 tablet (50 mg total) by mouth daily. 90 tablet 3  . atorvastatin (LIPITOR) 40 MG tablet Take 1 tablet (40 mg total) by mouth daily. 90 tablet 2  . citalopram (CELEXA) 40 MG tablet Take 1 tablet (40 mg total) by mouth daily. 90 tablet 1  . ibuprofen (ADVIL,MOTRIN) 400 MG tablet Take 400 mg by mouth every 4 (four) hours as needed for moderate pain.    Marland Kitchen levothyroxine (SYNTHROID) 150 MCG tablet Take 150 mcg by mouth daily before breakfast.    . levothyroxine (SYNTHROID) 25 MCG tablet Take 1 tablet (25 mcg total) by mouth daily before breakfast. 90 tablet 0   . Oxycodone HCl 10 MG TABS Take one daily as needed for severe back pain. 30 tablet 0  . tamsulosin (FLOMAX) 0.4 MG CAPS capsule Take 1 capsule (0.4 mg total) by mouth daily. 90 capsule 4  . Tiotropium Bromide-Olodaterol (STIOLTO RESPIMAT) 2.5-2.5 MCG/ACT AERS Inhale 2 puffs into the lungs daily. 8 g 0   No current facility-administered medications for this visit.    Allergies:   Carboplatin and Flecainide    Social History:  The patient  reports that he quit smoking about 6 years ago. His smoking use included cigarettes. He has a 50.00 pack-year smoking history. He has never used smokeless tobacco. He reports that he does not drink alcohol or use drugs.   Family History:  The patient's family history includes Breast cancer in his maternal aunt, maternal aunt, and mother.    ROS:  Please see the history of present illness.   Otherwise, review of systems are positive for fatigue.   All other systems are reviewed and negative.    PHYSICAL EXAM: VS:  BP 114/62   Pulse (!) 104   Temp (!) 97.1 F (36.2 C)   Ht 6\' 2"  (1.88 m)   Wt 267 lb (121.1 kg)   SpO2 94%   BMI 34.28 kg/m  , BMI Body mass index is 34.28 kg/m. GENERAL:  Well appearing HEENT: Pupils equal round and reactive, fundi not visualized, oral mucosa unremarkable NECK:  No jugular venous distention, waveform within normal limits, carotid upstroke brisk and symmetric, no bruits LUNGS:  Mild rhonchi at L base HEART:  Irregularly irregular.  PMI not displaced or sustained,S1 and S2 within normal limits, no S3, no S4, no clicks, no rubs, no murmurs ABD:  Flat, positive bowel sounds normal in frequency in pitch, no bruits, no rebound, no guarding, no midline pulsatile mass, no hepatomegaly, no splenomegaly EXT:  2 plus pulses throughout, no edema, no cyanosis no clubbing SKIN:  No rashes no nodules NEURO:  Cranial nerves II through XII grossly intact, motor grossly intact throughout PSYCH:  Cognitively intact, oriented to  person place and time   EKG:  EKG is ordered today. The ekg ordered 08/29/16 demonstrates atrial fibrillation. Rate 92 bpm. PVCs. Right axis deviation. Incomplete right bundle branch block. Nonspecific ST/T changes.  01/02/18: Atrial fibrillation.  Rate 95 bpm. 03/19/19: Atrial fibrillation.  Rate 104 bpm.  Incomplete RBBB.     Recent Labs: 03/06/2019: ALT 19; BUN 20; Creatinine, Ser 1.08; Hemoglobin 16.2; Platelets 159.0; Potassium 4.3; Sodium 140; TSH 5.66    Lipid Panel    Component Value Date/Time   CHOL 122 03/06/2019 0956   CHOL 115 12/19/2016 0933   TRIG 203.0 (H) 03/06/2019 0956   HDL 35.90 (L) 03/06/2019 8127  HDL 33 (L) 12/19/2016 0933   CHOLHDL 3 03/06/2019 0956   VLDL 40.6 (H) 03/06/2019 0956   LDLCALC 34 12/19/2016 0933   LDLDIRECT 55.0 03/06/2019 0956      Wt Readings from Last 3 Encounters:  03/19/19 267 lb (121.1 kg)  03/11/19 267 lb 3.2 oz (121.2 kg)  03/10/19 268 lb 3.2 oz (121.7 kg)      ASSESSMENT AND PLAN:  # Longstanding persistent atrial fibrillation: # Fatigue: Mr. Roesler continues to have fatigue and shortness of breath.  It seems that it is mostly related to his lung disease, though atrial fibrillation can certainly contribute.  His heart rate was initially elevated when walking but was <100 bpm at rest.  He became very dyspneic just with getting on and off the exam table.  He is not a candidate for ablation due to long duration in atrial fibrillation.  He is not a candidate for amiodarone given his lung disease and hypothyroidism.  We again discussed dofetilide but he declined due to cost and the need for hospitalization.  Continue atenolol and Eliquis.  # Hyperlipidemia: Continue atorvastatin.  LDL 55 03/2019.    Current medicines are reviewed at length with the patient today.  The patient does not have concerns regarding medicines.  The following changes have been made:  no change  Labs/ tests ordered today include:   Orders Placed This  Encounter  Procedures  . EKG 12-Lead     Disposition:   FU with Romeka Scifres C. Oval Linsey, MD, [facility] in 1 year.    Signed, Elenora Hawbaker C. Oval Linsey, MD, [facility]  03/19/2019 6:01 PM    [facility]

## 2019-03-19 NOTE — Patient Instructions (Signed)
Medication Instructions:  Your physician recommends that you continue on your current medications as directed. Please refer to the Current Medication list given to you today.  *If you need a refill on your cardiac medications before your next appointment, please call your pharmacy*  Lab Work: NONE   Testing/Procedures: NONE   Follow-Up: At [facility], you and your health needs are our priority.  As part of our continuing mission to provide you with exceptional heart care, we have created designated Provider Care Teams.  These Care Teams include your primary Cardiologist (physician) and Advanced Practice Providers (APPs -  Physician Assistants and Nurse Practitioners) who all work together to provide you with the care you need, when you need it.  Your next appointment:   12 month(s)  You will receive a reminder letter in the mail two months in advance. If you don't receive a letter, please call our office to schedule the follow-up appointment.  The format for your next appointment:   Either In Person or Virtual  Provider:   You may see DR [facility]  or one of the following Advanced Practice Providers on your designated Care Team:    Kerin Ransom, PA-C  [place], [place]  Coletta Memos, [place]

## 2019-03-24 ENCOUNTER — Ambulatory Visit (HOSPITAL_COMMUNITY)
Admission: RE | Admit: 2019-03-24 | Discharge: 2019-03-24 | Disposition: A | Discharge status: Home / Self Care | Payer: Medicare HMO | Source: Ambulatory Visit | Attending: Internal Medicine | Admitting: Internal Medicine

## 2019-03-24 ENCOUNTER — Other Ambulatory Visit: Payer: Self-pay

## 2019-03-24 DIAGNOSIS — C3490 Malignant neoplasm of unspecified part of unspecified bronchus or lung: Secondary | ICD-10-CM | POA: Diagnosis not present

## 2019-03-24 DIAGNOSIS — K8020 Calculus of gallbladder without cholecystitis without obstruction: Secondary | ICD-10-CM | POA: Diagnosis not present

## 2019-03-24 MED ORDER — IOHEXOL 300 MG/ML  SOLN
100.0000 mL | Freq: Once | INTRAMUSCULAR | Status: AC | PRN
  Administered 2019-03-24: 100 mL via INTRAVENOUS

## 2019-03-24 MED ORDER — SODIUM CHLORIDE (PF) 0.9 % IJ SOLN
INTRAMUSCULAR | Status: AC
  Filled 2019-03-24: qty 50

## 2019-03-24 MED ORDER — IOHEXOL 9 MG/ML PO SOLN
ORAL | Status: AC
  Filled 2019-03-24: qty 500

## 2019-03-24 MED ORDER — HEPARIN SOD (PORK) LOCK FLUSH 100 UNIT/ML IV SOLN
INTRAVENOUS | Status: AC
  Filled 2019-03-24: qty 5

## 2019-03-24 MED ORDER — HEPARIN SOD (PORK) LOCK FLUSH 100 UNIT/ML IV SOLN
500.0000 [IU] | Freq: Once | INTRAVENOUS | Status: AC
  Administered 2019-03-24: 500 [IU] via INTRAVENOUS

## 2019-03-26 ENCOUNTER — Inpatient Hospital Stay: Payer: Medicare HMO

## 2019-03-26 ENCOUNTER — Inpatient Hospital Stay (HOSPITAL_BASED_OUTPATIENT_CLINIC_OR_DEPARTMENT_OTHER): Payer: Medicare HMO | Admitting: Internal Medicine

## 2019-03-26 ENCOUNTER — Encounter: Payer: Self-pay | Admitting: Internal Medicine

## 2019-03-26 ENCOUNTER — Other Ambulatory Visit: Payer: Self-pay

## 2019-03-26 ENCOUNTER — Other Ambulatory Visit: Payer: Self-pay | Admitting: Internal Medicine

## 2019-03-26 VITALS — BP 121/77 | HR 105 | Temp 98.3°F | Resp 18 | Ht 74.0 in | Wt 264.2 lb

## 2019-03-26 VITALS — HR 95

## 2019-03-26 DIAGNOSIS — C3492 Malignant neoplasm of unspecified part of left bronchus or lung: Secondary | ICD-10-CM

## 2019-03-26 DIAGNOSIS — Z5112 Encounter for antineoplastic immunotherapy: Secondary | ICD-10-CM | POA: Diagnosis not present

## 2019-03-26 DIAGNOSIS — Z7901 Long term (current) use of anticoagulants: Secondary | ICD-10-CM | POA: Diagnosis not present

## 2019-03-26 DIAGNOSIS — C3490 Malignant neoplasm of unspecified part of unspecified bronchus or lung: Secondary | ICD-10-CM

## 2019-03-26 DIAGNOSIS — Z8639 Personal history of other endocrine, nutritional and metabolic disease: Secondary | ICD-10-CM

## 2019-03-26 DIAGNOSIS — C770 Secondary and unspecified malignant neoplasm of lymph nodes of head, face and neck: Secondary | ICD-10-CM | POA: Diagnosis not present

## 2019-03-26 DIAGNOSIS — Z902 Acquired absence of lung [part of]: Secondary | ICD-10-CM | POA: Diagnosis not present

## 2019-03-26 DIAGNOSIS — E032 Hypothyroidism due to medicaments and other exogenous substances: Secondary | ICD-10-CM

## 2019-03-26 DIAGNOSIS — I4819 Other persistent atrial fibrillation: Secondary | ICD-10-CM | POA: Diagnosis not present

## 2019-03-26 DIAGNOSIS — Z923 Personal history of irradiation: Secondary | ICD-10-CM | POA: Diagnosis not present

## 2019-03-26 DIAGNOSIS — Z95828 Presence of other vascular implants and grafts: Secondary | ICD-10-CM

## 2019-03-26 DIAGNOSIS — C7951 Secondary malignant neoplasm of bone: Secondary | ICD-10-CM

## 2019-03-26 DIAGNOSIS — Z9221 Personal history of antineoplastic chemotherapy: Secondary | ICD-10-CM | POA: Diagnosis not present

## 2019-03-26 DIAGNOSIS — E039 Hypothyroidism, unspecified: Secondary | ICD-10-CM | POA: Diagnosis not present

## 2019-03-26 DIAGNOSIS — C3432 Malignant neoplasm of lower lobe, left bronchus or lung: Secondary | ICD-10-CM

## 2019-03-26 LAB — CBC WITH DIFFERENTIAL (CANCER CENTER ONLY)
Abs Immature Granulocytes: 0.02 10*3/uL (ref 0.00–0.07)
Basophils Absolute: 0 10*3/uL (ref 0.0–0.1)
Basophils Relative: 0 %
Eosinophils Absolute: 0.1 10*3/uL (ref 0.0–0.5)
Eosinophils Relative: 1 %
HCT: 44.9 % (ref 39.0–52.0)
Hemoglobin: 15.9 g/dL (ref 13.0–17.0)
Immature Granulocytes: 0 %
Lymphocytes Relative: 21 %
Lymphs Abs: 1.6 10*3/uL (ref 0.7–4.0)
MCH: 34.3 pg — ABNORMAL HIGH (ref 26.0–34.0)
MCHC: 35.4 g/dL (ref 30.0–36.0)
MCV: 96.8 fL (ref 80.0–100.0)
Monocytes Absolute: 0.6 10*3/uL (ref 0.1–1.0)
Monocytes Relative: 8 %
Neutro Abs: 5.4 10*3/uL (ref 1.7–7.7)
Neutrophils Relative %: 70 %
Platelet Count: 155 10*3/uL (ref 150–400)
RBC: 4.64 MIL/uL (ref 4.22–5.81)
RDW: 13 % (ref 11.5–15.5)
WBC Count: 7.8 10*3/uL (ref 4.0–10.5)
nRBC: 0 % (ref 0.0–0.2)

## 2019-03-26 LAB — CMP (CANCER CENTER ONLY)
ALT: 21 U/L (ref 0–44)
AST: 24 U/L (ref 15–41)
Albumin: 4 g/dL (ref 3.5–5.0)
Alkaline Phosphatase: 77 U/L (ref 38–126)
Anion gap: 9 (ref 5–15)
BUN: 15 mg/dL (ref 8–23)
CO2: 26 mmol/L (ref 22–32)
Calcium: 9.1 mg/dL (ref 8.9–10.3)
Chloride: 105 mmol/L (ref 98–111)
Creatinine: 0.92 mg/dL (ref 0.61–1.24)
GFR, Est AFR Am: >60 mL/min (ref >60)
GFR, Estimated: >60 mL/min (ref >60)
Glucose, Bld: 123 mg/dL — ABNORMAL HIGH (ref 70–99)
Potassium: 4.1 mmol/L (ref 3.5–5.1)
Sodium: 140 mmol/L (ref 135–145)
Total Bilirubin: 1.1 mg/dL (ref 0.3–1.2)
Total Protein: 6.5 g/dL (ref 6.5–8.1)

## 2019-03-26 LAB — TSH: TSH: 4.764 u[IU]/mL — ABNORMAL HIGH (ref 0.320–4.118)

## 2019-03-26 MED ORDER — HEPARIN SOD (PORK) LOCK FLUSH 100 UNIT/ML IV SOLN
500.0000 [IU] | Freq: Once | INTRAVENOUS | Status: AC | PRN
  Administered 2019-03-26: 500 [IU]
  Filled 2019-03-26: qty 5

## 2019-03-26 MED ORDER — SODIUM CHLORIDE 0.9% FLUSH
10.0000 mL | INTRAVENOUS | Status: DC | PRN
  Administered 2019-03-26: 09:00:00 10 mL
  Filled 2019-03-26: qty 10

## 2019-03-26 MED ORDER — SODIUM CHLORIDE 0.9 % IV SOLN
200.0000 mg | Freq: Once | INTRAVENOUS | Status: AC
  Administered 2019-03-26: 200 mg via INTRAVENOUS
  Filled 2019-03-26: qty 8

## 2019-03-26 MED ORDER — SODIUM CHLORIDE 0.9% FLUSH
10.0000 mL | INTRAVENOUS | Status: DC | PRN
  Administered 2019-03-26: 10 mL
  Filled 2019-03-26: qty 10

## 2019-03-26 MED ORDER — SODIUM CHLORIDE 0.9 % IV SOLN
Freq: Once | INTRAVENOUS | Status: AC
  Filled 2019-03-26: qty 250

## 2019-03-26 NOTE — Patient Instructions (Signed)
[facility] Discharge Instructions for Patients Receiving Chemotherapy  Today you received the following chemotherapy agents:  Keytruda.  To help prevent nausea and vomiting after your treatment, we encourage you to take your nausea medication as directed.   If you develop nausea and vomiting that is not controlled by your nausea medication, call the clinic.   BELOW ARE SYMPTOMS THAT SHOULD BE REPORTED IMMEDIATELY:  *FEVER GREATER THAN 100.5 F  *CHILLS WITH OR WITHOUT FEVER  NAUSEA AND VOMITING THAT IS NOT CONTROLLED WITH YOUR NAUSEA MEDICATION  *UNUSUAL SHORTNESS OF BREATH  *UNUSUAL BRUISING OR BLEEDING  TENDERNESS IN MOUTH AND THROAT WITH OR WITHOUT PRESENCE OF ULCERS  *URINARY PROBLEMS  *BOWEL PROBLEMS  UNUSUAL RASH Items with * indicate a potential emergency and should be followed up as soon as possible.  Feel free to call the clinic should you have any questions or concerns. The clinic phone number is [phone].  Please show the CHEMO ALERT CARD at check-in to the Emergency Department and triage nurse.    

## 2019-03-26 NOTE — Progress Notes (Signed)
[place] [phone]   [phone]  OFFICE PROGRESS NOTE  [name], MD [place] [address]  DIAGNOSIS: Metastatic non-small cell lung cancer initially diagnosed as stage IIIA (T2a, N2, M0) non-small cell lung cancer, poorly differentiated adenocarcinoma presented with left lower lobe lung mass in addition to mediastinal lymphadenopathy.  The patient was diagnosed with metastatic disease involving the left femur as well as left supraclavicular nodal metastases and right paratracheal lymphadenopathy in October 2018.  Biomarker Findings Microsatellite Status - MS-Stable Tumor Mutational Burden - TMB-Low (3 Muts/Mb) Genomic Findings For a complete list of the genes assayed, please refer to the Appendix. STK11 P243f*6 CPCHE0BpT24ELYsplice site 1590+9P>JDAXX E374* MLL2 [id number] NBN K23346f NOTCH2 [id number] splice site [id number]+5Q>HDisease relevant genes with no reportable alterations: EGFR, KRAS, ALK, BRAF, MET, RET, ERBB2, ROS1   PDL1 expression 5%  PRIOR THERAPY: 1) status post wedge resection of the left lower lobe lung mass as well as AP window lymph node dissection but there was residual metastatic mediastinal lymphadenopathy that could not be resected. 2) a course of concurrent chemorad[place] carbopl[place]  3) status post palliative radiotherapy to the left femur metastatic bone disease. 4)  Systemic chemotherapy with carboplatin for AUC of 5, Alimta 500 mg/M2 and Keytruda 200 mg IV every 3 weeks.  First dose March 07, 2017.  Carboplatin was discontinued during cycle #2 secondary to hypersensitivity reaction. 5) status post 2 cycles of maintenance treatment with Alimta and Ketruda (pembrolizumab).  Alimta was discontinued secondary to intolerance.  CURRENT THERAPY: Maintenance treatment with single agent Ketruda (pembrolizumab) status  post 31  cycles.  INTERVAL HISTORY: MicDELLAS GUARD 1o. male returns to the clinic today for follow-up visit.  The patient is feeling fine today with no concerning complaints except for intermittent pain in the left thigh area.  He denied having any chest pain, shortness of breath except with exertion with no cough or hemoptysis.  He denied having any fever or chills.  He has no nausea, vomiting, diarrhea or constipation.  He has no headache or visual changes.  He denied having any recent weight loss or night sweats.  He has been tolerating his treatment with single agent Keytruda fairly well.  MEDICAL HISTORY: Past Medical History:  Diagnosis Date  . Adenocarcinoma of left lung, stage 3 ([facility]/16/2018  . Arthritis   . Atrial fibrillation ([facility]/16/2018  . Atrial fibrillation ([place] . COPD (chronic obstructive pulmonary disease) ([facility]/16/2018  . History of chemotherapy   . History of radiation therapy   . Hyperlipidemia   . Hypothyroid 08/15/2016  . Longstanding persistent atrial fibrillation ([place]/30/2018  . Pathologic fracture    left femur  . Pneumonitis   . S/P TURP 08/15/2016  . Wears glasses   . Wears hearing aid in both ears     ALLERGIES:  is allergic to carboplatin and flecainide.  MEDICATIONS:  Current Outpatient Medications  Medication Sig Dispense Refill  . albuterol (VENTOLIN HFA) 108 (90 Base) MCG/ACT inhaler Inhale 1-2 puffs into the lungs every 4 (four) hours as needed for wheezing or shortness of breath. 3 month supply 24 g 3  . apixaban (ELIQUIS) 5 MG TABS tablet Take 1 tablet (5 mg total) by mouth 2 (two) times daily. 180 tablet 3  . atenolol (TENORMIN) 50 MG tablet Take 1 tablet (50 mg total) by mouth daily.  90 tablet 3  . atorvastatin (LIPITOR) 40 MG tablet Take 1 tablet (40 mg total) by mouth daily. 90 tablet 2  . citalopram (CELEXA) 40 MG tablet Take 1 tablet (40 mg total) by mouth daily. 90 tablet 1  . ibuprofen (ADVIL,MOTRIN) 400 MG tablet Take  400 mg by mouth every 4 (four) hours as needed for moderate pain.    Marland Kitchen levothyroxine (SYNTHROID) 150 MCG tablet Take 150 mcg by mouth daily before breakfast.    . levothyroxine (SYNTHROID) 25 MCG tablet Take 1 tablet (25 mcg total) by mouth daily before breakfast. 90 tablet 0  . Oxycodone HCl 10 MG TABS Take one daily as needed for severe back pain. 30 tablet 0  . tamsulosin (FLOMAX) 0.4 MG CAPS capsule Take 1 capsule (0.4 mg total) by mouth daily. 90 capsule 4  . Tiotropium Bromide-Olodaterol (STIOLTO RESPIMAT) 2.5-2.5 MCG/ACT AERS Inhale 2 puffs into the lungs daily. 8 g 0   No current facility-administered medications for this visit.    SURGICAL HISTORY:  Past Surgical History:  Procedure Laterality Date  . BRONCHOSCOPY  10/2014  . CARDIAC CATHETERIZATION     05/07/12  . CARDIOVERSION     x2  . COLONOSCOPY    . DG BIOPSY LUNG Left 10/2014   FNA - Adenocarcinoma   . FEMUR IM NAIL Left 02/19/2017  . FEMUR IM NAIL Left 02/19/2017   Procedure: INTRAMEDULLARY (IM) NAIL FEMORAL;  Surgeon: Marchia Bond, MD;  Location: [facility];  Service: Orthopedics;  Laterality: Left;  . IR FLUORO GUIDE PORT INSERTION RIGHT  07/03/2017  . IR US GUIDE VASC ACCESS RIGHT  07/03/2017  . LUNG CANCER SURGERY Left 12/2014   Wedge Resection   . MULTIPLE TOOTH EXTRACTIONS    . Status post TURP    . TONSILLECTOMY      REVIEW OF SYSTEMS:  Constitutional: positive for fatigue Eyes: negative Ears, nose, mouth, throat, and face: negative Respiratory: positive for dyspnea on exertion Cardiovascular: negative Gastrointestinal: negative Genitourinary:negative Integument/breast: negative Hematologic/lymphatic: negative Musculoskeletal:negative Neurological: negative Behavioral/Psych: negative Endocrine: negative Allergic/Immunologic: negative   PHYSICAL EXAMINATION: General appearance: alert, cooperative, fatigued and no distress Head: Normocephalic, without obvious abnormality, atraumatic Neck: no adenopathy,  no JVD, supple, symmetrical, trachea midline and thyroid not enlarged, symmetric, no tenderness/mass/nodules Lymph nodes: Cervical, supraclavicular, and axillary nodes normal. Resp: clear to auscultation bilaterally Back: symmetric, no curvature. ROM normal. No CVA tenderness. Cardio: regular rate and rhythm, S1, S2 normal, no murmur, click, rub or gallop GI: soft, non-tender; bowel sounds normal; no masses,  no organomegaly Extremities: extremities normal, atraumatic, no cyanosis or edema Neurologic: Alert and oriented X 3, normal strength and tone. Normal symmetric reflexes. Normal coordination and gait  ECOG PERFORMANCE STATUS: 1 - Symptomatic but completely ambulatory  Blood pressure 121/77, pulse (!) 105, temperature 98.3 F (36.8 C), temperature source Temporal, resp. rate 18, height 6' 2"  (1.88 m), weight 264 lb 3.2 oz (119.8 kg), SpO2 93 %.  LABORATORY DATA: Lab Results  Component Value Date   WBC 7.8 03/26/2019   HGB 15.9 03/26/2019   HCT 44.9 03/26/2019   MCV 96.8 03/26/2019   PLT 155 03/26/2019      Chemistry      Component Value Date/Time   NA 140 03/06/2019 0956   NA 136 04/04/2017 1141   K 4.3 03/06/2019 0956   K 4.4 04/04/2017 1141   CL 104 03/06/2019 0956   CO2 28 03/06/2019 0956   CO2 24 04/04/2017 1141   BUN 20 03/06/2019 0956  BUN 21.1 04/04/2017 1141   CREATININE 1.08 03/06/2019 0956   CREATININE 1.24 03/05/2019 1045   CREATININE 1.1 04/04/2017 1141      Component Value Date/Time   CALCIUM 9.2 03/06/2019 0956   CALCIUM 9.1 04/04/2017 1141   ALKPHOS 72 03/06/2019 0956   ALKPHOS 89 04/04/2017 1141   AST 20 03/06/2019 0956   AST 25 03/05/2019 1045   AST 17 04/04/2017 1141   ALT 19 03/06/2019 0956   ALT 22 03/05/2019 1045   ALT 18 04/04/2017 1141   BILITOT 0.9 03/06/2019 0956   BILITOT 0.8 03/05/2019 1045   BILITOT 0.67 04/04/2017 1141       RADIOGRAPHIC STUDIES: CT Chest W Contrast  Result Date: 03/24/2019 CLINICAL DATA:  Non-small-cell  lung cancer.  Restaging. EXAM: CT CHEST, ABDOMEN, AND PELVIS WITH CONTRAST TECHNIQUE: Multidetector CT imaging of the chest, abdomen and pelvis was performed following the standard protocol during bolus administration of intravenous contrast. CONTRAST:  175m OMNIPAQUE IOHEXOL 300 MG/ML  SOLN COMPARISON:  01/01/2019 FINDINGS: CT CHEST FINDINGS Cardiovascular: Stable heart size Coronary artery calcification is evident. Atherosclerotic calcification is noted in the wall of the thoracic aorta. Enlargement of the pulmonary arteries is compatible with pulmonary arterial hypertension. Trace pericardial effusion is similar. Right Port-A-Cath tip is positioned in the upper right atrium. Mediastinum/Nodes: Index right paratracheal node measured previously at 1.3 cm short axis measures 1.1 cm short axis today. Index subcarinal node measured previously at 1.8 cm is now 1.6 cm. No new or progressive mediastinal adenopathy. There is no axillary lymphadenopathy. Lungs/Pleura: Advanced changes of emphysema noted bilaterally. The confluent opacity with volume loss in the parahilar left lung is stable and likely reflects radiation scarring. No new suspicious pulmonary nodule or mass. Musculoskeletal: No worrisome lytic or sclerotic osseous abnormality. CT ABDOMEN PELVIS FINDINGS Hepatobiliary: No suspicious focal abnormality within the liver parenchyma. Calcified gallstones again noted. No intrahepatic or extrahepatic biliary dilation. Pancreas: No focal mass lesion. No dilatation of the main duct. No intraparenchymal cyst. No peripancreatic edema. Spleen: No splenomegaly. No focal mass lesion. Adrenals/Urinary Tract: No adrenal nodule or mass. Tiny low-density subcapsular lesion in the interpolar right kidney is stable and likely a cyst. 2.6 cm interpolar left renal cyst is unchanged and a smaller 1.5 cm exophytic left renal cyst is stable. No evidence for hydroureter. The urinary bladder appears normal for the degree of  distention. Stomach/Bowel: Stomach is unremarkable. No gastric wall thickening. No evidence of outlet obstruction. Duodenum is normally positioned as is the ligament of Treitz. No small bowel wall thickening. No small bowel dilatation. The terminal ileum is normal. The appendix is normal. No gross colonic mass. No colonic wall thickening. Vascular/Lymphatic: There is abdominal aortic atherosclerosis without aneurysm. 11 mm short axis gastrohepatic ligament lymph node is stable. No retroperitoneal lymphadenopathy. No pelvic sidewall lymphadenopathy. Reproductive: The prostate gland appears mildly enlarged. Seminal vesicles are unremarkable. Other: No intraperitoneal free fluid. Musculoskeletal: Bilateral groin hernias contain only fat. Status post ORIF for left femoral neck fracture. No worrisome lytic or sclerotic osseous abnormality. IMPRESSION: 1. Mediastinal lymphadenopathy has decreased slightly in the interval. The gastrohepatic ligament lymph node is stable. 2. Stable post treatment changes in the parahilar left lung. 3. No new or progressive findings on today's study to suggest worsening disease. 4. Enlargement of the main pulmonary artery suggests pulmonary arterial hypertension. 5. Cholelithiasis. 6.  Aortic Atherosclerois (ICD10-170.0) 7.  Emphysema. (([id number]) Electronically Signed   By: EMisty StanleyM.D.   On: 03/24/2019 16:27   CT  Abdomen Pelvis W Contrast  Result Date: 03/24/2019 CLINICAL DATA:  Non-small-cell lung cancer.  Restaging. EXAM: CT CHEST, ABDOMEN, AND PELVIS WITH CONTRAST TECHNIQUE: Multidetector CT imaging of the chest, abdomen and pelvis was performed following the standard protocol during bolus administration of intravenous contrast. CONTRAST:  158m OMNIPAQUE IOHEXOL 300 MG/ML  SOLN COMPARISON:  01/01/2019 FINDINGS: CT CHEST FINDINGS Cardiovascular: Stable heart size Coronary artery calcification is evident. Atherosclerotic calcification is noted in the wall of the thoracic  aorta. Enlargement of the pulmonary arteries is compatible with pulmonary arterial hypertension. Trace pericardial effusion is similar. Right Port-A-Cath tip is positioned in the upper right atrium. Mediastinum/Nodes: Index right paratracheal node measured previously at 1.3 cm short axis measures 1.1 cm short axis today. Index subcarinal node measured previously at 1.8 cm is now 1.6 cm. No new or progressive mediastinal adenopathy. There is no axillary lymphadenopathy. Lungs/Pleura: Advanced changes of emphysema noted bilaterally. The confluent opacity with volume loss in the parahilar left lung is stable and likely reflects radiation scarring. No new suspicious pulmonary nodule or mass. Musculoskeletal: No worrisome lytic or sclerotic osseous abnormality. CT ABDOMEN PELVIS FINDINGS Hepatobiliary: No suspicious focal abnormality within the liver parenchyma. Calcified gallstones again noted. No intrahepatic or extrahepatic biliary dilation. Pancreas: No focal mass lesion. No dilatation of the main duct. No intraparenchymal cyst. No peripancreatic edema. Spleen: No splenomegaly. No focal mass lesion. Adrenals/Urinary Tract: No adrenal nodule or mass. Tiny low-density subcapsular lesion in the interpolar right kidney is stable and likely a cyst. 2.6 cm interpolar left renal cyst is unchanged and a smaller 1.5 cm exophytic left renal cyst is stable. No evidence for hydroureter. The urinary bladder appears normal for the degree of distention. Stomach/Bowel: Stomach is unremarkable. No gastric wall thickening. No evidence of outlet obstruction. Duodenum is normally positioned as is the ligament of Treitz. No small bowel wall thickening. No small bowel dilatation. The terminal ileum is normal. The appendix is normal. No gross colonic mass. No colonic wall thickening. Vascular/Lymphatic: There is abdominal aortic atherosclerosis without aneurysm. 11 mm short axis gastrohepatic ligament lymph node is stable. No  retroperitoneal lymphadenopathy. No pelvic sidewall lymphadenopathy. Reproductive: The prostate gland appears mildly enlarged. Seminal vesicles are unremarkable. Other: No intraperitoneal free fluid. Musculoskeletal: Bilateral groin hernias contain only fat. Status post ORIF for left femoral neck fracture. No worrisome lytic or sclerotic osseous abnormality. IMPRESSION: 1. Mediastinal lymphadenopathy has decreased slightly in the interval. The gastrohepatic ligament lymph node is stable. 2. Stable post treatment changes in the parahilar left lung. 3. No new or progressive findings on today's study to suggest worsening disease. 4. Enlargement of the main pulmonary artery suggests pulmonary arterial hypertension. 5. Cholelithiasis. 6.  Aortic Atherosclerois (ICD10-170.0) 7.  Emphysema. (([id number]) Electronically Signed   By: EMisty StanleyM.D.   On: 03/24/2019 16:27    ASSESSMENT AND PLAN: This is a 68years old white male with metastatic non-small cell lung cancer, adenocarcinoma with no actionable mutations and PDL 1 expression of 5% that was initially diagnosed as stage IIIa non-small cell lung cancer, adenocarcinoma status post left lower lobectomy with lymph node dissection followed by a course of concurrent chemoradiation completed in January 2016.  The patient had evidence for disease metastasis in October 2018 with metastatic disease to the left femur as well as left supraclavicular and right paratracheal lymph nodes. The patient is currently on systemic chemotherapy initially was with carboplatin, Alimta and Keytruda.  Carboplatin was discontinued secondary to hypersensitivity reaction starting from cycle #2.  He was also treated with 3 cycles of maintenance Alimta and Ketruda (pembrolizumab) but Alimta was discontinued secondary to intolerance. He is currently undergoing treatment with maintenance Keytruda as a single agent status post 31 cycles.  The patient has been tolerating this treatment well  with no concerning adverse effects. He had repeat CT scan of the chest, abdomen pelvis performed recently.  I personally and independently reviewed the scans and discussed the results with the patient today. His scan showed no concerning findings for disease progression and there was some improvement of the mediastinal lymph nodes. I recommended for the patient to continue his current treatment with [facility] and he will proceed with cycle #32 today. For the hypothyroidism, he will continue his current treatment with levothyroxine and we will monitor his TSH closely. He will come back for follow-up visit in 3 weeks for evaluation before the next cycle of his treatment. The patient was advised to call immediately if he has any concerning symptoms in the interval.  The patient voices understanding of current disease status and treatment options and is in agreement with the current care plan. All questions were answered. The patient knows to call the clinic with any problems, questions or concerns. We can certainly see the patient much sooner if necessary.  Disclaimer: This note was dictated with voice recognition software. Similar sounding words can inadvertently be transcribed and may not be corrected upon review.

## 2019-04-08 ENCOUNTER — Other Ambulatory Visit: Payer: Self-pay | Admitting: *Deleted

## 2019-04-08 MED ORDER — APIXABAN 5 MG PO TABS: 5.0000 mg | ORAL_TABLET | Freq: Two times a day (BID) | ORAL | 1 refills | Status: DC

## 2019-04-08 NOTE — Telephone Encounter (Signed)
Prescription refill request for Eliquis received.  Last office visit: 12/17/2020Oval Linsey Scr: 1.08, 03/06/2019 Age: 69 y.o. Weight: 119.8 kg   Prescriptions refill sent.

## 2019-04-13 ENCOUNTER — Other Ambulatory Visit: Payer: Self-pay

## 2019-04-13 MED ORDER — APIXABAN 5 MG PO TABS: 5.0000 mg | ORAL_TABLET | Freq: Two times a day (BID) | ORAL | 1 refills | Status: DC

## 2019-04-16 ENCOUNTER — Other Ambulatory Visit: Payer: Self-pay

## 2019-04-16 ENCOUNTER — Inpatient Hospital Stay: Payer: Medicare HMO

## 2019-04-16 ENCOUNTER — Encounter: Payer: Self-pay | Admitting: Internal Medicine

## 2019-04-16 ENCOUNTER — Inpatient Hospital Stay: Payer: Medicare HMO | Attending: Internal Medicine

## 2019-04-16 ENCOUNTER — Inpatient Hospital Stay (HOSPITAL_BASED_OUTPATIENT_CLINIC_OR_DEPARTMENT_OTHER): Payer: Medicare HMO | Admitting: Internal Medicine

## 2019-04-16 VITALS — BP 136/79 | HR 116 | Temp 97.7°F | Resp 20 | Ht 74.0 in | Wt 265.6 lb

## 2019-04-16 VITALS — HR 100

## 2019-04-16 DIAGNOSIS — Z902 Acquired absence of lung [part of]: Secondary | ICD-10-CM | POA: Diagnosis not present

## 2019-04-16 DIAGNOSIS — C3432 Malignant neoplasm of lower lobe, left bronchus or lung: Secondary | ICD-10-CM | POA: Insufficient documentation

## 2019-04-16 DIAGNOSIS — I4819 Other persistent atrial fibrillation: Secondary | ICD-10-CM | POA: Diagnosis not present

## 2019-04-16 DIAGNOSIS — Z5112 Encounter for antineoplastic immunotherapy: Secondary | ICD-10-CM

## 2019-04-16 DIAGNOSIS — C7951 Secondary malignant neoplasm of bone: Secondary | ICD-10-CM | POA: Diagnosis not present

## 2019-04-16 DIAGNOSIS — E039 Hypothyroidism, unspecified: Secondary | ICD-10-CM | POA: Diagnosis not present

## 2019-04-16 DIAGNOSIS — Z923 Personal history of irradiation: Secondary | ICD-10-CM | POA: Insufficient documentation

## 2019-04-16 DIAGNOSIS — C3492 Malignant neoplasm of unspecified part of left bronchus or lung: Secondary | ICD-10-CM

## 2019-04-16 DIAGNOSIS — Z9221 Personal history of antineoplastic chemotherapy: Secondary | ICD-10-CM | POA: Diagnosis not present

## 2019-04-16 DIAGNOSIS — Z7901 Long term (current) use of anticoagulants: Secondary | ICD-10-CM | POA: Diagnosis not present

## 2019-04-16 DIAGNOSIS — Z95828 Presence of other vascular implants and grafts: Secondary | ICD-10-CM

## 2019-04-16 DIAGNOSIS — Z79899 Other long term (current) drug therapy: Secondary | ICD-10-CM | POA: Insufficient documentation

## 2019-04-16 LAB — CMP (CANCER CENTER ONLY)
ALT: 19 U/L (ref 0–44)
AST: 22 U/L (ref 15–41)
Albumin: 4.1 g/dL (ref 3.5–5.0)
Alkaline Phosphatase: 85 U/L (ref 38–126)
Anion gap: 10 (ref 5–15)
BUN: 18 mg/dL (ref 8–23)
CO2: 25 mmol/L (ref 22–32)
Calcium: 9.2 mg/dL (ref 8.9–10.3)
Chloride: 107 mmol/L (ref 98–111)
Creatinine: 1.13 mg/dL (ref 0.61–1.24)
GFR, Est AFR Am: >60 mL/min (ref >60)
GFR, Estimated: >60 mL/min (ref >60)
Glucose, Bld: 119 mg/dL — ABNORMAL HIGH (ref 70–99)
Potassium: 4.2 mmol/L (ref 3.5–5.1)
Sodium: 142 mmol/L (ref 135–145)
Total Bilirubin: 1 mg/dL (ref 0.3–1.2)
Total Protein: 6.5 g/dL (ref 6.5–8.1)

## 2019-04-16 LAB — CBC WITH DIFFERENTIAL (CANCER CENTER ONLY)
Abs Immature Granulocytes: 0.02 10*3/uL (ref 0.00–0.07)
Basophils Absolute: 0 10*3/uL (ref 0.0–0.1)
Basophils Relative: 0 %
Eosinophils Absolute: 0.1 10*3/uL (ref 0.0–0.5)
Eosinophils Relative: 2 %
HCT: 46.3 % (ref 39.0–52.0)
Hemoglobin: 16.4 g/dL (ref 13.0–17.0)
Immature Granulocytes: 0 %
Lymphocytes Relative: 20 %
Lymphs Abs: 1.6 10*3/uL (ref 0.7–4.0)
MCH: 34.6 pg — ABNORMAL HIGH (ref 26.0–34.0)
MCHC: 35.4 g/dL (ref 30.0–36.0)
MCV: 97.7 fL (ref 80.0–100.0)
Monocytes Absolute: 0.6 10*3/uL (ref 0.1–1.0)
Monocytes Relative: 8 %
Neutro Abs: 5.5 10*3/uL (ref 1.7–7.7)
Neutrophils Relative %: 70 %
Platelet Count: 153 10*3/uL (ref 150–400)
RBC: 4.74 MIL/uL (ref 4.22–5.81)
RDW: 12.8 % (ref 11.5–15.5)
WBC Count: 7.9 10*3/uL (ref 4.0–10.5)
nRBC: 0 % (ref 0.0–0.2)

## 2019-04-16 LAB — TSH: TSH: 2.843 u[IU]/mL (ref 0.320–4.118)

## 2019-04-16 MED ORDER — SODIUM CHLORIDE 0.9 % IV SOLN
200.0000 mg | Freq: Once | INTRAVENOUS | Status: AC
  Administered 2019-04-16: 200 mg via INTRAVENOUS
  Filled 2019-04-16: qty 8

## 2019-04-16 MED ORDER — SODIUM CHLORIDE 0.9% FLUSH
10.0000 mL | INTRAVENOUS | Status: DC | PRN
  Administered 2019-04-16: 10 mL
  Filled 2019-04-16: qty 10

## 2019-04-16 MED ORDER — HEPARIN SOD (PORK) LOCK FLUSH 100 UNIT/ML IV SOLN
500.0000 [IU] | Freq: Once | INTRAVENOUS | Status: AC | PRN
  Administered 2019-04-16: 500 [IU]
  Filled 2019-04-16: qty 5

## 2019-04-16 MED ORDER — SODIUM CHLORIDE 0.9 % IV SOLN
Freq: Once | INTRAVENOUS | Status: AC
  Filled 2019-04-16: qty 250

## 2019-04-16 NOTE — Progress Notes (Signed)
[place] [phone]) [phone]   [phone]  OFFICE PROGRESS NOTE  [name], [place] [address]  DIAGNOSIS: Metastatic non-small cell lung cancer initially diagnosed as stage IIIA (T2a, N2, M0) non-small cell lung cancer, poorly differentiated adenocarcinoma presented with left lower lobe lung mass in addition to mediastinal lymphadenopathy.  The patient was diagnosed with metastatic disease involving the left femur as well as left supraclavicular nodal metastases and right paratracheal lymphadenopathy in October 2018.  Biomarker Findings Microsatellite Status - MS-Stable Tumor Mutational Burden - TMB-Low (3 Muts/Mb) Genomic Findings For a complete list of the genes assayed, please refer to the Appendix. STK11 P261f*6 [id number] site 1532+0E>BDAXX E374* MLL2 [id number] NBN K23358f NOTCH2 [id number] splice site [id number]+8H>FDisease relevant genes with no reportable alterations: EGFR, KRAS, ALK, BRAF, MET, RET, ERBB2, ROS1   PDL1 expression 5%  PRIOR THERAPY: 1) status post wedge resection of the left lower lobe lung mass as well as AP window lymph node dissection but there was residual metastatic mediastinal lymphadenopathy that could not be resected. 2) a course of concurrent chemoradiation [place] carboplatin [place]  3) status post palliative radiotherapy to the left femur metastatic bone disease. 4)  Systemic chemotherapy with carboplatin for AUC of 5, Alimta 500 mg/M2 and Keytruda 200 mg IV every 3 weeks.  First dose March 07, 2017.  Carboplatin was discontinued during cycle #2 secondary to hypersensitivity reaction. 5) status post 2 cycles of maintenance treatment with Alimta and Ketruda (pembrolizumab).  Alimta was discontinued secondary to intolerance.  CURRENT THERAPY: Maintenance treatment with single agent Ketruda (pembrolizumab) status  post 32  cycles.  INTERVAL HISTORY: Victor Huber 73o. male returns to the clinic today for follow-up visit.  The patient is complaining of increasing fatigue and shortness of breath with exertion.  He also has dry cough.  He was seen recently by Dr. ByrLamonte Sakaid currently on treatment with rescue inhaler in addition to his other treatment for COPD.  He has some mild skin lesion especially on the scalp.  The patient denied having any chest pain or hemoptysis.  He denied having any fever or chills.  He has no nausea, vomiting, diarrhea or constipation.  He denied having any headache or visual changes.  He continues to tolerate his treatment with Keytruda fairly well.  Is here today for evaluation before starting cycle #33 of maintenance single agent Keytruda.  MEDICAL HISTORY: Past Medical History:  Diagnosis Date  . Adenocarcinoma of left lung, stage 3 ([place]/16/2018  . Arthritis   . Atrial fibrillation ([place]/16/2018  . Atrial fibrillation ([place] . COPD (chronic obstructive pulmonary disease) ([facility]/16/2018  . History of chemotherapy   . History of radiation therapy   . Hyperlipidemia   . Hypothyroid 08/15/2016  . Longstanding persistent atrial fibrillation ([facility]CC[facility]/30/2018  . Pathologic fracture    left femur  . Pneumonitis   . S/P TURP 08/15/2016  . Wears glasses   . Wears hearing aid in both ears     ALLERGIES:  is allergic to carboplatin and flecainide.  MEDICATIONS:  Current Outpatient Medications  Medication Sig Dispense Refill  . albuterol (VENTOLIN HFA) 108 (90 Base) MCG/ACT inhaler Inhale 1-2 puffs into the lungs every 4 (four) hours as needed for wheezing or shortness of breath. 3 month supply 24 g 3  . apixaban (ELIQUIS) 5 MG TABS tablet Take 1  tablet (5 mg total) by mouth 2 (two) times daily. 180 tablet 1  . atenolol (TENORMIN) 50 MG tablet Take 1 tablet (50 mg total) by mouth daily. 90 tablet 3  . atorvastatin (LIPITOR) 40 MG tablet Take 1 tablet (40 mg total) by  mouth daily. 90 tablet 2  . citalopram (CELEXA) 40 MG tablet Take 1 tablet (40 mg total) by mouth daily. 90 tablet 1  . ibuprofen (ADVIL,MOTRIN) 400 MG tablet Take 400 mg by mouth every 4 (four) hours as needed for moderate pain.    Marland Kitchen levothyroxine (SYNTHROID) 150 MCG tablet Take 150 mcg by mouth daily before breakfast.    . levothyroxine (SYNTHROID) 25 MCG tablet Take 1 tablet (25 mcg total) by mouth daily before breakfast. 90 tablet 0  . Oxycodone HCl 10 MG TABS Take one daily as needed for severe back pain. 30 tablet 0  . tamsulosin (FLOMAX) 0.4 MG CAPS capsule Take 1 capsule (0.4 mg total) by mouth daily. 90 capsule 4  . Tiotropium Bromide-Olodaterol (STIOLTO RESPIMAT) 2.5-2.5 MCG/ACT AERS Inhale 2 puffs into the lungs daily. 8 g 0   No current facility-administered medications for this visit.    SURGICAL HISTORY:  Past Surgical History:  Procedure Laterality Date  . BRONCHOSCOPY  10/2014  . CARDIAC CATHETERIZATION     05/07/12  . CARDIOVERSION     x2  . COLONOSCOPY    . DG BIOPSY LUNG Left 10/2014   FNA - Adenocarcinoma   . FEMUR IM NAIL Left 02/19/2017  . FEMUR IM NAIL Left 02/19/2017   Procedure: INTRAMEDULLARY (IM) NAIL FEMORAL;  Surgeon: Marchia Bond, MD;  Location: [facility];  Service: Orthopedics;  Laterality: Left;  . IR FLUORO GUIDE PORT INSERTION RIGHT  07/03/2017  . IR US GUIDE VASC ACCESS RIGHT  07/03/2017  . LUNG CANCER SURGERY Left 12/2014   Wedge Resection   . MULTIPLE TOOTH EXTRACTIONS    . Status post TURP    . TONSILLECTOMY      REVIEW OF SYSTEMS:  A comprehensive review of systems was negative except for: Constitutional: positive for fatigue Respiratory: positive for cough and dyspnea on exertion Integument/breast: positive for rash   PHYSICAL EXAMINATION: General appearance: alert, cooperative, fatigued and no distress Head: Normocephalic, without obvious abnormality, atraumatic Neck: no adenopathy, no JVD, supple, symmetrical, trachea midline and thyroid not  enlarged, symmetric, no tenderness/mass/nodules Lymph nodes: Cervical, supraclavicular, and axillary nodes normal. Resp: clear to auscultation bilaterally Back: symmetric, no curvature. ROM normal. No CVA tenderness. Cardio: regular rate and rhythm, S1, S2 normal, no murmur, click, rub or gallop GI: soft, non-tender; bowel sounds normal; no masses,  no organomegaly Extremities: extremities normal, atraumatic, no cyanosis or edema  ECOG PERFORMANCE STATUS: 1 - Symptomatic but completely ambulatory  Blood pressure 136/79, pulse (!) 116, temperature 97.7 F (36.5 C), temperature source Temporal, resp. rate 20, height 6' 2"  (1.88 m), weight 265 lb 9.6 oz (120.5 kg), SpO2 94 %.  LABORATORY DATA: Lab Results  Component Value Date   WBC 7.9 04/16/2019   HGB 16.4 04/16/2019   HCT 46.3 04/16/2019   MCV 97.7 04/16/2019   PLT 153 04/16/2019      Chemistry      Component Value Date/Time   NA 140 03/26/2019 0845   NA 136 04/04/2017 1141   K 4.1 03/26/2019 0845   K 4.4 04/04/2017 1141   CL 105 03/26/2019 0845   CO2 26 03/26/2019 0845   CO2 24 04/04/2017 1141   BUN 15 03/26/2019 0845  BUN 21.1 04/04/2017 1141   CREATININE 0.92 03/26/2019 0845   CREATININE 1.1 04/04/2017 1141      Component Value Date/Time   CALCIUM 9.1 03/26/2019 0845   CALCIUM 9.1 04/04/2017 1141   ALKPHOS 77 03/26/2019 0845   ALKPHOS 89 04/04/2017 1141   AST 24 03/26/2019 0845   AST 17 04/04/2017 1141   ALT 21 03/26/2019 0845   ALT 18 04/04/2017 1141   BILITOT 1.1 03/26/2019 0845   BILITOT 0.67 04/04/2017 1141       RADIOGRAPHIC STUDIES: CT Chest W Contrast  Result Date: 03/24/2019 CLINICAL DATA:  Non-small-cell lung cancer.  Restaging. EXAM: CT CHEST, ABDOMEN, AND PELVIS WITH CONTRAST TECHNIQUE: Multidetector CT imaging of the chest, abdomen and pelvis was performed following the standard protocol during bolus administration of intravenous contrast. CONTRAST:  158m OMNIPAQUE IOHEXOL 300 MG/ML  SOLN  COMPARISON:  01/01/2019 FINDINGS: CT CHEST FINDINGS Cardiovascular: Stable heart size Coronary artery calcification is evident. Atherosclerotic calcification is noted in the wall of the thoracic aorta. Enlargement of the pulmonary arteries is compatible with pulmonary arterial hypertension. Trace pericardial effusion is similar. Right Port-A-Cath tip is positioned in the upper right atrium. Mediastinum/Nodes: Index right paratracheal node measured previously at 1.3 cm short axis measures 1.1 cm short axis today. Index subcarinal node measured previously at 1.8 cm is now 1.6 cm. No new or progressive mediastinal adenopathy. There is no axillary lymphadenopathy. Lungs/Pleura: Advanced changes of emphysema noted bilaterally. The confluent opacity with volume loss in the parahilar left lung is stable and likely reflects radiation scarring. No new suspicious pulmonary nodule or mass. Musculoskeletal: No worrisome lytic or sclerotic osseous abnormality. CT ABDOMEN PELVIS FINDINGS Hepatobiliary: No suspicious focal abnormality within the liver parenchyma. Calcified gallstones again noted. No intrahepatic or extrahepatic biliary dilation. Pancreas: No focal mass lesion. No dilatation of the main duct. No intraparenchymal cyst. No peripancreatic edema. Spleen: No splenomegaly. No focal mass lesion. Adrenals/Urinary Tract: No adrenal nodule or mass. Tiny low-density subcapsular lesion in the interpolar right kidney is stable and likely a cyst. 2.6 cm interpolar left renal cyst is unchanged and a smaller 1.5 cm exophytic left renal cyst is stable. No evidence for hydroureter. The urinary bladder appears normal for the degree of distention. Stomach/Bowel: Stomach is unremarkable. No gastric wall thickening. No evidence of outlet obstruction. Duodenum is normally positioned as is the ligament of Treitz. No small bowel wall thickening. No small bowel dilatation. The terminal ileum is normal. The appendix is normal. No gross  colonic mass. No colonic wall thickening. Vascular/Lymphatic: There is abdominal aortic atherosclerosis without aneurysm. 11 mm short axis gastrohepatic ligament lymph node is stable. No retroperitoneal lymphadenopathy. No pelvic sidewall lymphadenopathy. Reproductive: The prostate gland appears mildly enlarged. Seminal vesicles are unremarkable. Other: No intraperitoneal free fluid. Musculoskeletal: Bilateral groin hernias contain only fat. Status post ORIF for left femoral neck fracture. No worrisome lytic or sclerotic osseous abnormality. IMPRESSION: 1. Mediastinal lymphadenopathy has decreased slightly in the interval. The gastrohepatic ligament lymph node is stable. 2. Stable post treatment changes in the parahilar left lung. 3. No new or progressive findings on today's study to suggest worsening disease. 4. Enlargement of the main pulmonary artery suggests pulmonary arterial hypertension. 5. Cholelithiasis. 6.  Aortic Atherosclerois (ICD10-170.0) 7.  Emphysema. (([id number]) Electronically Signed   By: EMisty StanleyM.D.   On: 03/24/2019 16:27   CT Abdomen Pelvis W Contrast  Result Date: 03/24/2019 CLINICAL DATA:  Non-small-cell lung cancer.  Restaging. EXAM: CT CHEST, ABDOMEN, AND PELVIS WITH CONTRAST  TECHNIQUE: Multidetector CT imaging of the chest, abdomen and pelvis was performed following the standard protocol during bolus administration of intravenous contrast. CONTRAST:  126m OMNIPAQUE IOHEXOL 300 MG/ML  SOLN COMPARISON:  01/01/2019 FINDINGS: CT CHEST FINDINGS Cardiovascular: Stable heart size Coronary artery calcification is evident. Atherosclerotic calcification is noted in the wall of the thoracic aorta. Enlargement of the pulmonary arteries is compatible with pulmonary arterial hypertension. Trace pericardial effusion is similar. Right Port-A-Cath tip is positioned in the upper right atrium. Mediastinum/Nodes: Index right paratracheal node measured previously at 1.3 cm short axis measures 1.1  cm short axis today. Index subcarinal node measured previously at 1.8 cm is now 1.6 cm. No new or progressive mediastinal adenopathy. There is no axillary lymphadenopathy. Lungs/Pleura: Advanced changes of emphysema noted bilaterally. The confluent opacity with volume loss in the parahilar left lung is stable and likely reflects radiation scarring. No new suspicious pulmonary nodule or mass. Musculoskeletal: No worrisome lytic or sclerotic osseous abnormality. CT ABDOMEN PELVIS FINDINGS Hepatobiliary: No suspicious focal abnormality within the liver parenchyma. Calcified gallstones again noted. No intrahepatic or extrahepatic biliary dilation. Pancreas: No focal mass lesion. No dilatation of the main duct. No intraparenchymal cyst. No peripancreatic edema. Spleen: No splenomegaly. No focal mass lesion. Adrenals/Urinary Tract: No adrenal nodule or mass. Tiny low-density subcapsular lesion in the interpolar right kidney is stable and likely a cyst. 2.6 cm interpolar left renal cyst is unchanged and a smaller 1.5 cm exophytic left renal cyst is stable. No evidence for hydroureter. The urinary bladder appears normal for the degree of distention. Stomach/Bowel: Stomach is unremarkable. No gastric wall thickening. No evidence of outlet obstruction. Duodenum is normally positioned as is the ligament of Treitz. No small bowel wall thickening. No small bowel dilatation. The terminal ileum is normal. The appendix is normal. No gross colonic mass. No colonic wall thickening. Vascular/Lymphatic: There is abdominal aortic atherosclerosis without aneurysm. 11 mm short axis gastrohepatic ligament lymph node is stable. No retroperitoneal lymphadenopathy. No pelvic sidewall lymphadenopathy. Reproductive: The prostate gland appears mildly enlarged. Seminal vesicles are unremarkable. Other: No intraperitoneal free fluid. Musculoskeletal: Bilateral groin hernias contain only fat. Status post ORIF for left femoral neck fracture. No  worrisome lytic or sclerotic osseous abnormality. IMPRESSION: 1. Mediastinal lymphadenopathy has decreased slightly in the interval. The gastrohepatic ligament lymph node is stable. 2. Stable post treatment changes in the parahilar left lung. 3. No new or progressive findings on today's study to suggest worsening disease. 4. Enlargement of the main pulmonary artery suggests pulmonary arterial hypertension. 5. Cholelithiasis. 6.  Aortic Atherosclerois (ICD10-170.0) 7.  Emphysema. (([id number]) Electronically Signed   By: EMisty StanleyM.D.   On: 03/24/2019 16:27    ASSESSMENT AND PLAN: This is a 69years old white male with metastatic non-small cell lung cancer, adenocarcinoma with no actionable mutations and PDL 1 expression of 5% that was initially diagnosed as stage IIIa non-small cell lung cancer, adenocarcinoma status post left lower lobectomy with lymph node dissection followed by a course of concurrent chemoradiation completed in January 2016.  The patient had evidence for disease metastasis in October 2018 with metastatic disease to the left femur as well as left supraclavicular and right paratracheal lymph nodes. The patient is currently on systemic chemotherapy initially was with carboplatin, Alimta and Keytruda.  Carboplatin was discontinued secondary to hypersensitivity reaction starting from cycle #2.   He was also treated with 3 cycles of maintenance Alimta and Ketruda (pembrolizumab) but Alimta was discontinued secondary to intolerance. He is currently undergoing  treatment with maintenance Keytruda as a single agent status post 32cycles.  The patient has been tolerating this treatment well with no concerning adverse effect except for mild skin rash. I recommended for him to proceed with cycle #33 today as planned. For the shortness of breath, he will continue with his current treatment for COPD and I will consider repeating imaging studies if he has any worsening dyspnea. For the  hypothyroidism, he will continue his current treatment with levothyroxine and we will monitor his TSH closely. I will see him back for follow-up visit in 3 weeks for evaluation before starting cycle #34. He was advised to call immediately if he has any concerning symptoms in the interval. The patient voices understanding of current disease status and treatment options and is in agreement with the current care plan. All questions were answered. The patient knows to call the clinic with any problems, questions or concerns. We can certainly see the patient much sooner if necessary.  Disclaimer: This note was dictated with voice recognition software. Similar sounding words can inadvertently be transcribed and may not be corrected upon review.

## 2019-04-16 NOTE — Progress Notes (Signed)
HR 116, ok to proceed per MD.  REcheck HR 100 per rn

## 2019-04-17 ENCOUNTER — Telehealth: Payer: Self-pay | Admitting: Internal Medicine

## 2019-04-17 NOTE — Telephone Encounter (Signed)
Scheduled per 1/14 los. Called and spoke with pt, pt will confirmed appt date and time via mychart

## 2019-04-23 ENCOUNTER — Other Ambulatory Visit: Payer: Self-pay | Admitting: Cardiovascular Disease

## 2019-04-23 DIAGNOSIS — C3492 Malignant neoplasm of unspecified part of left bronchus or lung: Secondary | ICD-10-CM

## 2019-04-23 DIAGNOSIS — C3490 Malignant neoplasm of unspecified part of unspecified bronchus or lung: Secondary | ICD-10-CM

## 2019-05-07 ENCOUNTER — Inpatient Hospital Stay: Payer: Medicare HMO | Attending: Internal Medicine

## 2019-05-07 ENCOUNTER — Encounter: Payer: Self-pay | Admitting: Internal Medicine

## 2019-05-07 ENCOUNTER — Other Ambulatory Visit: Payer: Self-pay

## 2019-05-07 ENCOUNTER — Inpatient Hospital Stay: Payer: Medicare HMO

## 2019-05-07 ENCOUNTER — Inpatient Hospital Stay (HOSPITAL_BASED_OUTPATIENT_CLINIC_OR_DEPARTMENT_OTHER): Payer: Medicare HMO | Admitting: Internal Medicine

## 2019-05-07 VITALS — BP 117/82 | HR 89 | Temp 97.8°F | Resp 18

## 2019-05-07 DIAGNOSIS — C3490 Malignant neoplasm of unspecified part of unspecified bronchus or lung: Secondary | ICD-10-CM | POA: Diagnosis not present

## 2019-05-07 DIAGNOSIS — Z8639 Personal history of other endocrine, nutritional and metabolic disease: Secondary | ICD-10-CM

## 2019-05-07 DIAGNOSIS — C7951 Secondary malignant neoplasm of bone: Secondary | ICD-10-CM | POA: Diagnosis not present

## 2019-05-07 DIAGNOSIS — Z923 Personal history of irradiation: Secondary | ICD-10-CM | POA: Diagnosis not present

## 2019-05-07 DIAGNOSIS — C3492 Malignant neoplasm of unspecified part of left bronchus or lung: Secondary | ICD-10-CM

## 2019-05-07 DIAGNOSIS — C3432 Malignant neoplasm of lower lobe, left bronchus or lung: Secondary | ICD-10-CM

## 2019-05-07 DIAGNOSIS — I4819 Other persistent atrial fibrillation: Secondary | ICD-10-CM | POA: Insufficient documentation

## 2019-05-07 DIAGNOSIS — Z9221 Personal history of antineoplastic chemotherapy: Secondary | ICD-10-CM | POA: Diagnosis not present

## 2019-05-07 DIAGNOSIS — Z5112 Encounter for antineoplastic immunotherapy: Secondary | ICD-10-CM | POA: Diagnosis not present

## 2019-05-07 DIAGNOSIS — Z95828 Presence of other vascular implants and grafts: Secondary | ICD-10-CM

## 2019-05-07 DIAGNOSIS — C770 Secondary and unspecified malignant neoplasm of lymph nodes of head, face and neck: Secondary | ICD-10-CM | POA: Diagnosis not present

## 2019-05-07 DIAGNOSIS — E032 Hypothyroidism due to medicaments and other exogenous substances: Secondary | ICD-10-CM

## 2019-05-07 DIAGNOSIS — Z7901 Long term (current) use of anticoagulants: Secondary | ICD-10-CM | POA: Diagnosis not present

## 2019-05-07 DIAGNOSIS — Z902 Acquired absence of lung [part of]: Secondary | ICD-10-CM | POA: Insufficient documentation

## 2019-05-07 DIAGNOSIS — E039 Hypothyroidism, unspecified: Secondary | ICD-10-CM | POA: Diagnosis not present

## 2019-05-07 DIAGNOSIS — Z79899 Other long term (current) drug therapy: Secondary | ICD-10-CM | POA: Insufficient documentation

## 2019-05-07 LAB — CBC WITH DIFFERENTIAL (CANCER CENTER ONLY)
Abs Immature Granulocytes: 0.01 10*3/uL (ref 0.00–0.07)
Basophils Absolute: 0 10*3/uL (ref 0.0–0.1)
Basophils Relative: 0 %
Eosinophils Absolute: 0.1 10*3/uL (ref 0.0–0.5)
Eosinophils Relative: 2 %
HCT: 44.7 % (ref 39.0–52.0)
Hemoglobin: 15.8 g/dL (ref 13.0–17.0)
Immature Granulocytes: 0 %
Lymphocytes Relative: 22 %
Lymphs Abs: 1.5 10*3/uL (ref 0.7–4.0)
MCH: 34 pg (ref 26.0–34.0)
MCHC: 35.3 g/dL (ref 30.0–36.0)
MCV: 96.1 fL (ref 80.0–100.0)
Monocytes Absolute: 0.6 10*3/uL (ref 0.1–1.0)
Monocytes Relative: 9 %
Neutro Abs: 4.5 10*3/uL (ref 1.7–7.7)
Neutrophils Relative %: 67 %
Platelet Count: 163 10*3/uL (ref 150–400)
RBC: 4.65 MIL/uL (ref 4.22–5.81)
RDW: 13 % (ref 11.5–15.5)
WBC Count: 6.7 10*3/uL (ref 4.0–10.5)
nRBC: 0 % (ref 0.0–0.2)

## 2019-05-07 LAB — CMP (CANCER CENTER ONLY)
ALT: 22 U/L (ref 0–44)
AST: 23 U/L (ref 15–41)
Albumin: 3.8 g/dL (ref 3.5–5.0)
Alkaline Phosphatase: 73 U/L (ref 38–126)
Anion gap: 7 (ref 5–15)
BUN: 16 mg/dL (ref 8–23)
CO2: 26 mmol/L (ref 22–32)
Calcium: 9 mg/dL (ref 8.9–10.3)
Chloride: 106 mmol/L (ref 98–111)
Creatinine: 1.08 mg/dL (ref 0.61–1.24)
GFR, Est AFR Am: >60 mL/min (ref >60)
GFR, Estimated: >60 mL/min (ref >60)
Glucose, Bld: 115 mg/dL — ABNORMAL HIGH (ref 70–99)
Potassium: 4.2 mmol/L (ref 3.5–5.1)
Sodium: 139 mmol/L (ref 135–145)
Total Bilirubin: 1.3 mg/dL — ABNORMAL HIGH (ref 0.3–1.2)
Total Protein: 6.4 g/dL — ABNORMAL LOW (ref 6.5–8.1)

## 2019-05-07 LAB — TSH: TSH: 2.182 u[IU]/mL (ref 0.320–4.118)

## 2019-05-07 MED ORDER — HEPARIN SOD (PORK) LOCK FLUSH 100 UNIT/ML IV SOLN
500.0000 [IU] | Freq: Once | INTRAVENOUS | Status: AC | PRN
  Administered 2019-05-07: 11:00:00 500 [IU]
  Filled 2019-05-07: qty 5

## 2019-05-07 MED ORDER — SODIUM CHLORIDE 0.9 % IV SOLN
Freq: Once | INTRAVENOUS | Status: AC
  Filled 2019-05-07: qty 250

## 2019-05-07 MED ORDER — SODIUM CHLORIDE 0.9% FLUSH
10.0000 mL | INTRAVENOUS | Status: DC | PRN
  Administered 2019-05-07: 10 mL
  Filled 2019-05-07: qty 10

## 2019-05-07 MED ORDER — SODIUM CHLORIDE 0.9 % IV SOLN
200.0000 mg | Freq: Once | INTRAVENOUS | Status: AC
  Administered 2019-05-07: 200 mg via INTRAVENOUS
  Filled 2019-05-07: qty 8

## 2019-05-07 NOTE — Progress Notes (Signed)
[place] [phone]   [phone]  OFFICE PROGRESS NOTE  [name], MD [place] [address]  DIAGNOSIS: Metastatic non-small cell lung cancer initially diagnosed as stage IIIA (T2a, N2, M0) non-small cell lung cancer, poorly differentiated adenocarcinoma presented with left lower lobe lung mass in addition to mediastinal lymphadenopathy.  The patient was diagnosed with metastatic disease involving the left femur as well as left supraclavicular nodal metastases and right paratracheal lymphadenopathy in October 2018.  Biomarker Findings Microsatellite Status - MS-Stable Tumor Mutational Burden - TMB-Low (3 Muts/Mb) Genomic Findings For a complete list of the genes assayed, please refer to the Appendix. STK11 P288f*6 [id number] site 1810+2V>GDAXX E374* MLL2 V4531f40 NBN K2337f NOTCH2 [id number] splice site 988175+3M>ZDisease relevant genes with no reportable alterations: EGFR, KRAS, ALK, BRAF, MET, RET, ERBB2, ROS1   PDL1 expression 5%  PRIOR THERAPY: 1) status post wedge resection of the left lower lobe lung mass as well as AP window lymph node dissection but there was residual metastatic mediastinal lymphadenopathy that could not be resected. 2) a course of concurrent chemorad[place]  3) status post palliative radiotherapy to the left femur metastatic bone disease. 4)  Systemic chemotherapy with carboplatin for AUC of 5, Alimta 500 mg/M2 and Keytruda 200 mg IV every 3 weeks.  First dose March 07, 2017.  Carboplatin was discontinued during cycle #2 secondary to hypersensitivity reaction. 5) status post 2 cycles of maintenance treatment with Alimta and Ketruda (pembrolizumab).  Alimta was discontinued secondary to intolerance.  CURRENT THERAPY: Maintenance treatment with single agent Ketruda (pembrolizumab) status  post 33  cycles.  INTERVAL HISTORY: Victor Huber 59o. male returns to the clinic today for follow-up visit.  The patient is feeling fine today with no concerning complaints.  He denied having any chest pain, shortness of breath, cough or hemoptysis.  He denied having any fever or chills.  He has no nausea, vomiting, diarrhea or constipation.  He has no headache or visual changes.  He continues to tolerate his treatment with Keytruda fairly well.  The patient is here today for evaluation before starting cycle #34 of his maintenance treatment.  MEDICAL HISTORY: Past Medical History:  Diagnosis Date  . Adenocarcinoma of left lung, stage 3 ([facility]/16/2018  . Arthritis   . Atrial fibrillation ([place]/16/2018  . Atrial fibrillation ([place] COPD (chronic obstructive pulmonary disease) ([facility]/16/2018  . History of chemotherapy   . History of radiation therapy   . Hyperlipidemia   . Hypothyroid 08/15/2016  . Longstanding persistent atrial fibrillation ([facility]/30/2018  . Pathologic fracture    left femur  . Pneumonitis   . S/P TURP 08/15/2016  . Wears glasses   . Wears hearing aid in both ears     ALLERGIES:  is allergic to carboplatin and flecainide.  MEDICATIONS:  Current Outpatient Medications  Medication Sig Dispense Refill  . albuterol (VENTOLIN HFA) 108 (90 Base) MCG/ACT inhaler Inhale 1-2 puffs into the lungs every 4 (four) hours as needed for wheezing or shortness of breath. 3 month supply 24 g 3  . apixaban (ELIQUIS) 5 MG TABS tablet Take 1 tablet (5 mg total) by mouth 2 (two) times daily. 180 tablet 1  . atenolol (TENORMIN) 50 MG tablet TAKE 1 TABLET (50 MG TOTAL) BY MOUTH DAILY. 90 tablet 3  . atorvastatin (LIPITOR) 40 MG tablet Take  1 tablet (40 mg total) by mouth daily. 90 tablet 2  . citalopram (CELEXA) 40 MG tablet Take 1 tablet (40 mg total) by mouth daily. 90 tablet 1  . ibuprofen (ADVIL,MOTRIN) 400 MG tablet Take 400 mg by mouth every 4 (four) hours as needed for  moderate pain.    Marland Kitchen levothyroxine (SYNTHROID) 150 MCG tablet Take 150 mcg by mouth daily before breakfast.    . levothyroxine (SYNTHROID) 25 MCG tablet Take 1 tablet (25 mcg total) by mouth daily before breakfast. 90 tablet 0  . Oxycodone HCl 10 MG TABS Take one daily as needed for severe back pain. 30 tablet 0  . tamsulosin (FLOMAX) 0.4 MG CAPS capsule Take 1 capsule (0.4 mg total) by mouth daily. 90 capsule 4  . Tiotropium Bromide-Olodaterol (STIOLTO RESPIMAT) 2.5-2.5 MCG/ACT AERS Inhale 2 puffs into the lungs daily. 8 g 0   No current facility-administered medications for this visit.   Facility-Administered Medications Ordered in Other Visits  Medication Dose Route Frequency Provider Last Rate Last Admin  . sodium chloride flush (NS) 0.9 % injection 10 mL  10 mL Intracatheter PRN Curt Bears, MD   10 mL at 05/07/19 0830    SURGICAL HISTORY:  Past Surgical History:  Procedure Laterality Date  . BRONCHOSCOPY  10/2014  . CARDIAC CATHETERIZATION     05/07/12  . CARDIOVERSION     x2  . COLONOSCOPY    . DG BIOPSY LUNG Left 10/2014   FNA - Adenocarcinoma   . FEMUR IM NAIL Left 02/19/2017  . FEMUR IM NAIL Left 02/19/2017   Procedure: INTRAMEDULLARY (IM) NAIL FEMORAL;  Surgeon: Marchia Bond, MD;  Location: [facility];  Service: Orthopedics;  Laterality: Left;  . IR FLUORO GUIDE PORT INSERTION RIGHT  07/03/2017  . IR US GUIDE VASC ACCESS RIGHT  07/03/2017  . LUNG CANCER SURGERY Left 12/2014   Wedge Resection   . MULTIPLE TOOTH EXTRACTIONS    . Status post TURP    . TONSILLECTOMY      REVIEW OF SYSTEMS:  A comprehensive review of systems was negative except for: Constitutional: positive for fatigue   PHYSICAL EXAMINATION: General appearance: alert, cooperative, fatigued and no distress Head: Normocephalic, without obvious abnormality, atraumatic Neck: no adenopathy, no JVD, supple, symmetrical, trachea midline and thyroid not enlarged, symmetric, no tenderness/mass/nodules Lymph nodes:  Cervical, supraclavicular, and axillary nodes normal. Resp: clear to auscultation bilaterally Back: symmetric, no curvature. ROM normal. No CVA tenderness. Cardio: regular rate and rhythm, S1, S2 normal, no murmur, click, rub or gallop GI: soft, non-tender; bowel sounds normal; no masses,  no organomegaly Extremities: extremities normal, atraumatic, no cyanosis or edema  ECOG PERFORMANCE STATUS: 1 - Symptomatic but completely ambulatory  Blood pressure 117/82, pulse 89, temperature 97.8 F (36.6 C), temperature source Oral, resp. rate 18, SpO2 98 %.  LABORATORY DATA: Lab Results  Component Value Date   WBC 6.7 05/07/2019   HGB 15.8 05/07/2019   HCT 44.7 05/07/2019   MCV 96.1 05/07/2019   PLT 163 05/07/2019      Chemistry      Component Value Date/Time   NA 142 04/16/2019 0856   NA 136 04/04/2017 1141   K 4.2 04/16/2019 0856   K 4.4 04/04/2017 1141   CL 107 04/16/2019 0856   CO2 25 04/16/2019 0856   CO2 24 04/04/2017 1141   BUN 18 04/16/2019 0856   BUN 21.1 04/04/2017 1141   CREATININE 1.13 04/16/2019 0856   CREATININE 1.1 04/04/2017 1141  Component Value Date/Time   CALCIUM 9.2 04/16/2019 0856   CALCIUM 9.1 04/04/2017 1141   ALKPHOS 85 04/16/2019 0856   ALKPHOS 89 04/04/2017 1141   AST 22 04/16/2019 0856   AST 17 04/04/2017 1141   ALT 19 04/16/2019 0856   ALT 18 04/04/2017 1141   BILITOT 1.0 04/16/2019 0856   BILITOT 0.67 04/04/2017 1141       RADIOGRAPHIC STUDIES: No results found.  ASSESSMENT AND PLAN: This is a 69 years old white male with metastatic non-small cell lung cancer, adenocarcinoma with no actionable mutations and PDL 1 expression of 5% that was initially diagnosed as stage IIIa non-small cell lung cancer, adenocarcinoma status post left lower lobectomy with lymph node dissection followed by a course of concurrent chemoradiation completed in January 2016.  The patient had evidence for disease metastasis in October 2018 with metastatic disease  to the left femur as well as left supraclavicular and right paratracheal lymph nodes. The patient is currently on systemic chemotherapy initially was with carboplatin, Alimta and Keytruda.  Carboplatin was discontinued secondary to hypersensitivity reaction starting from cycle #2.   He was also treated with 3 cycles of maintenance Alimta and Ketruda (pembrolizumab) but Alimta was discontinued secondary to intolerance. He is currently undergoing treatment with maintenance Keytruda as a single agent status post 33cycles.  The patient has been tolerating this treatment well with no concerning adverse effects. I recommended for him to proceed with cycle #34 today as planned. I will see him back for follow-up visit in 3 weeks for evaluation before the next cycle of his treatment. He was advised to call immediately if he has any concerning symptoms in the interval. For the hypothyroidism, he will continue his current treatment with levothyroxine and we will monitor his TSH closely. The patient voices understanding of current disease status and treatment options and is in agreement with the current care plan. All questions were answered. The patient knows to call the clinic with any problems, questions or concerns. We can certainly see the patient much sooner if necessary.  Disclaimer: This note was dictated with voice recognition software. Similar sounding words can inadvertently be transcribed and may not be corrected upon review.

## 2019-05-07 NOTE — Patient Instructions (Signed)
[facility] Discharge Instructions for Patients Receiving Chemotherapy  Today you received the following chemotherapy agents:  Keytruda.  To help prevent nausea and vomiting after your treatment, we encourage you to take your nausea medication as directed.   If you develop nausea and vomiting that is not controlled by your nausea medication, call the clinic.   BELOW ARE SYMPTOMS THAT SHOULD BE REPORTED IMMEDIATELY:  *FEVER GREATER THAN 100.5 F  *CHILLS WITH OR WITHOUT FEVER  NAUSEA AND VOMITING THAT IS NOT CONTROLLED WITH YOUR NAUSEA MEDICATION  *UNUSUAL SHORTNESS OF BREATH  *UNUSUAL BRUISING OR BLEEDING  TENDERNESS IN MOUTH AND THROAT WITH OR WITHOUT PRESENCE OF ULCERS  *URINARY PROBLEMS  *BOWEL PROBLEMS  UNUSUAL RASH Items with * indicate a potential emergency and should be followed up as soon as possible.  Feel free to call the clinic should you have any questions or concerns. The clinic phone number is [phone].  Please show the CHEMO ALERT CARD at check-in to the Emergency Department and triage nurse.    

## 2019-05-08 ENCOUNTER — Ambulatory Visit: Payer: Medicare HMO

## 2019-05-11 ENCOUNTER — Ambulatory Visit: Payer: Medicare HMO | Attending: Internal Medicine

## 2019-05-11 DIAGNOSIS — Z23 Encounter for immunization: Secondary | ICD-10-CM | POA: Insufficient documentation

## 2019-05-11 NOTE — Progress Notes (Signed)
   Covid-19 Vaccination Clinic  Name:  KEELON ZURN    MRN: [id number] DOB: 01/03/1951  05/11/2019  Mr. Sachse was observed post Covid-19 immunization for 15 minutes without incidence. He was provided with Vaccine Information Sheet and instruction to access the V-Safe system.   Mr. Birnie was instructed to call 911 with any severe reactions post vaccine: Marland Kitchen Difficulty breathing  . Swelling of your face and throat  . A fast heartbeat  . A bad rash all over your body  . Dizziness and weakness    Immunizations Administered    Name Date Dose VIS Date Route   Pfizer COVID-19 Vaccine 05/11/2019  6:09 PM 0.3 mL 03/13/2019 Intramuscular   Manufacturer: [place]   Lot: [id number]   [place]upus: [id number]

## 2019-05-25 ENCOUNTER — Other Ambulatory Visit: Payer: Self-pay | Admitting: Family Medicine

## 2019-05-25 ENCOUNTER — Ambulatory Visit: Payer: Medicare HMO

## 2019-05-25 DIAGNOSIS — E039 Hypothyroidism, unspecified: Secondary | ICD-10-CM

## 2019-05-26 ENCOUNTER — Ambulatory Visit: Payer: Medicare HMO

## 2019-05-27 ENCOUNTER — Other Ambulatory Visit: Payer: Self-pay | Admitting: Medical Oncology

## 2019-05-27 DIAGNOSIS — C3492 Malignant neoplasm of unspecified part of left bronchus or lung: Secondary | ICD-10-CM

## 2019-05-27 DIAGNOSIS — E032 Hypothyroidism due to medicaments and other exogenous substances: Secondary | ICD-10-CM

## 2019-05-27 DIAGNOSIS — Z5112 Encounter for antineoplastic immunotherapy: Secondary | ICD-10-CM

## 2019-05-27 NOTE — Progress Notes (Signed)
[facility] OFFICE PROGRESS NOTE  [name], MD [place] [address]  DIAGNOSIS: Metastatic non-small cell lung cancer initially diagnosed as stage IIIA (T2a, N2, M0) non-small cell lung cancer, poorly differentiated adenocarcinoma presented with left lower lobe lung mass in addition to mediastinal lymphadenopathy. The patient was diagnosed with metastatic disease involving the left femur as well as left supraclavicular nodal metastases and right paratracheal lymphadenopathy in October 2018.  Biomarker Findings Microsatellite Status - MS-Stable Tumor Mutational Burden - TMB-Low (3 Muts/Mb) Genomic Findings For a complete list of the genes assayed, please refer to the Appendix. STK11 P253f*6 [id number] site 1009+3G>HDAXX E374* MLL2 [id number] NBN K23383f NOTCH2 [id number] splice site 988169+6V>EDisease relevant genes with no reportable alterations: EGFR, KRAS, ALK, BRAF, MET, RET, ERBB2, ROS1   PDL1 expression5%  PRIOR THERAPY: 1) status post wedge resection of the left lower lobe lung mass as well as AP window lymph node dissection but there was residual metastatic mediastinal lymphadenopathy that could not be resected. 2) a course of concurrent chemoradiation with [place] carboplatin [place]  3) status post palliative radiotherapy to the left femur metastatic bone disease. 4) Systemic chemotherapy with carboplatin for AUC of 5, Alimta 500 mg/M2 and Keytruda 200 mg IV every 3 weeks. First dose March 07, 2017. Carboplatin was discontinued during cycle #2 secondary to hypersensitivity reaction. 5) status post 2 cycles of maintenance treatment with Alimta and Ketruda (pembrolizumab). Alimta was discontinued secondary to intolerance  CURRENT THERAPY: Maintenance treatment with single agent Keytruda (pembrolizumab) status post 34cycles.   INTERVAL HISTORY: Victor Huber 69o. male returns to the clinic for a follow up visit. The patient is feeling well today without any concerning complaints except for constipation. His last bowel movement was about 3-4 days ago. He tried taking a stool softener a few days ago but has not tried a laxative yet. He denies abdominal pain, nausea, or vomiting.   The patient continues to tolerate treatment with [place] without any adverse effects. Denies any fever, chills, night sweats, or weight loss. Denies any chest pain or hemoptysis but endorses dyspnea on exertion and his baseline dry cough.  Denies any unusual headaches or visual changes. Denies any rashes or skin changes except for in his ears bilaterally which is is unsure if it is attributed to his hearing aid. He has a history of metastatic disease to the left femur. He is reporting a few months of intermittent left knee pain. The patient is here today for evaluation prior to starting cycle # 35  MEDICAL HISTORY: Past Medical History:  Diagnosis Date  . Adenocarcinoma of left lung, stage 3 ([facility]/16/2018  . Arthritis   . Atrial fibrillation ([facility]/16/2018  . Atrial fibrillation ([facility] [place] . COPD (chronic obstructive pulmonary disease) ([facility]/16/2018  . History of chemotherapy   . History of radiation therapy   . Hyperlipidemia   . Hypothyroid 08/15/2016  . Longstanding persistent atrial fibrillation ([facility]/30/2018  . Pathologic fracture    left femur  . Pneumonitis   . S/P TURP 08/15/2016  . Wears glasses   . Wears hearing aid in both ears     ALLERGIES:  is allergic to carboplatin and flecainide.  MEDICATIONS:  Current Outpatient Medications  Medication Sig Dispense Refill  . albuterol (VENTOLIN HFA) 108 (90 Base) MCG/ACT inhaler Inhale 1-2 puffs into the lungs every 4 (four) hours as needed for wheezing  or shortness of breath. 3 month supply 24 g 3  . apixaban (ELIQUIS) 5 MG TABS tablet Take 1 tablet (5 mg total) by mouth 2 (two) times daily.  180 tablet 1  . atenolol (TENORMIN) 50 MG tablet TAKE 1 TABLET (50 MG TOTAL) BY MOUTH DAILY. 90 tablet 3  . atorvastatin (LIPITOR) 40 MG tablet Take 1 tablet (40 mg total) by mouth daily. 90 tablet 2  . citalopram (CELEXA) 40 MG tablet Take 1 tablet (40 mg total) by mouth daily. 90 tablet 1  . ibuprofen (ADVIL,MOTRIN) 400 MG tablet Take 400 mg by mouth every 4 (four) hours as needed for moderate pain.    Marland Kitchen levothyroxine (SYNTHROID) 150 MCG tablet Take 150 mcg by mouth daily before breakfast.    . levothyroxine (SYNTHROID) 25 MCG tablet TAKE 1 TABLET EVERY DAY BEFORE BREAKFAST 90 tablet 0  . Oxycodone HCl 10 MG TABS Take one daily as needed for severe back pain. 30 tablet 0  . tamsulosin (FLOMAX) 0.4 MG CAPS capsule Take 1 capsule (0.4 mg total) by mouth daily. 90 capsule 4   No current facility-administered medications for this visit.    SURGICAL HISTORY:  Past Surgical History:  Procedure Laterality Date  . BRONCHOSCOPY  10/2014  . CARDIAC CATHETERIZATION     05/07/12  . CARDIOVERSION     x2  . COLONOSCOPY    . DG BIOPSY LUNG Left 10/2014   FNA - Adenocarcinoma   . FEMUR IM NAIL Left 02/19/2017  . FEMUR IM NAIL Left 02/19/2017   Procedure: INTRAMEDULLARY (IM) NAIL FEMORAL;  Surgeon: Marchia Bond, MD;  Location: [facility];  Service: Orthopedics;  Laterality: Left;  . IR FLUORO GUIDE PORT INSERTION RIGHT  07/03/2017  . IR US GUIDE VASC ACCESS RIGHT  07/03/2017  . LUNG CANCER SURGERY Left 12/2014   Wedge Resection   . MULTIPLE TOOTH EXTRACTIONS    . Status post TURP    . TONSILLECTOMY      REVIEW OF SYSTEMS:   Review of Systems  Constitutional: Positive for fatigue. Negative for appetite change, chills, fever and unexpected weight change.  HENT: Negative for mouth sores, nosebleeds, sore throat and trouble swallowing.   Eyes: Negative for eye problems and icterus.  Respiratory: Positive for dyspnea on exertion and baseline dry cough. Negative for hemoptysis and wheezing.    Cardiovascular: Negative for chest pain and leg swelling.  Gastrointestinal: Positive for constipation. Negative for abdominal pain, diarrhea, nausea and vomiting.  Genitourinary: Negative for bladder incontinence, difficulty urinating, dysuria, frequency and hematuria.   Musculoskeletal: Positive for left knee pain. Negative for back pain, gait problem, neck pain and neck stiffness.  Skin: Negative for itching and rash.  Neurological: Negative for dizziness, extremity weakness, gait problem, headaches, light-headedness and seizures.  Hematological: Negative for adenopathy. Does not bruise/bleed easily.  Psychiatric/Behavioral: Negative for confusion, depression and sleep disturbance. The patient is not nervous/anxious.     PHYSICAL EXAMINATION:  Blood pressure 126/78, pulse 64, temperature 98.3 F (36.8 C), temperature source Oral, resp. rate 18, height '6\' 2"'$  (1.88 m), weight 259 lb 14.4 oz (117.9 kg), SpO2 94 %.  ECOG PERFORMANCE STATUS: 1 - Symptomatic but completely ambulatory  Physical Exam  Constitutional: Oriented to person, place, and time and well-developed, well-nourished, and in no distress.  HENT:  Head: Normocephalic and atraumatic.  Mouth/Throat: Oropharynx is clear and moist. No oropharyngeal exudate.  Eyes: Conjunctivae are normal. Right eye exhibits no discharge. Left eye exhibits no discharge. No scleral icterus.  Neck:  Normal range of motion. Neck supple.  Cardiovascular: Normal rate, irregular rhythm, normal heart sounds and intact distal pulses.   Pulmonary/Chest: Effort normal and breath sounds normal. No respiratory distress. No wheezes. No rales.  Abdominal: Soft. Bowel sounds are hypoactive. Exhibits no distension and no mass. There is no tenderness.  Musculoskeletal: Normal range of motion. Exhibits no edema.  Lymphadenopathy:    No cervical adenopathy.  Neurological: Alert and oriented to person, place, and time. Exhibits normal muscle tone. Gait normal.  Coordination normal.  Skin: Skin is warm and dry. No rash noted. Not diaphoretic. No erythema. No pallor.  Psychiatric: Mood, memory and judgment normal.  Vitals reviewed.  LABORATORY DATA: Lab Results  Component Value Date   WBC 6.9 05/28/2019   HGB 16.2 05/28/2019   HCT 45.6 05/28/2019   MCV 95.8 05/28/2019   PLT 157 05/28/2019      Chemistry      Component Value Date/Time   NA 142 05/28/2019 1119   NA 136 04/04/2017 1141   K 4.3 05/28/2019 1119   K 4.4 04/04/2017 1141   CL 107 05/28/2019 1119   CO2 26 05/28/2019 1119   CO2 24 04/04/2017 1141   BUN 13 05/28/2019 1119   BUN 21.1 04/04/2017 1141   CREATININE 0.93 05/28/2019 1119   CREATININE 1.1 04/04/2017 1141      Component Value Date/Time   CALCIUM 8.9 05/28/2019 1119   CALCIUM 9.1 04/04/2017 1141   ALKPHOS 89 05/28/2019 1119   ALKPHOS 89 04/04/2017 1141   AST 19 05/28/2019 1119   AST 17 04/04/2017 1141   ALT 18 05/28/2019 1119   ALT 18 04/04/2017 1141   BILITOT 1.0 05/28/2019 1119   BILITOT 0.67 04/04/2017 1141       RADIOGRAPHIC STUDIES:  No results found.   ASSESSMENT/PLAN:  This is a very pleasant 69 year old Caucasian male with metastatic non-small cell lung cancer, adenocarcinoma. He was initially diagnosed as astage IIIa. He presented with a left lower lobe lung mass in addition to mediastinal lymphadenopathy. He was diagnosed in January 2016. He is status post left lower lobectomy with lymph node dissection followed by concurrent chemoradiation. His PDL 1 expression is 5%.  He was diagnosed with metastatic disease involving the leftfemuras well as a left supraclavicular nodalmetastasis and right paratracheal lymphadenopathy in October 2018.  Hewasstarted on systemic chemotherapy with carboplatin, Alimta, and Keytruda. Carboplatin was discontinued after cycle #2 due to a hypersensitivity reaction. Alimta was also discontinued after cycle #4 due to intolerance. The patient has been on  maintenance treatment with single agent Keytruda for35cycles.   Labs were reviewed. We recommend that he proceed with cycle #36 today as scheduled.   I will arrange for a restaging CT scan of the chest, abdomen, pelvis, and left femur prior to his next visit. The patient has having left knee pain so we will assess to ensure no progressive bone metastasis in this region.  We will see him back for a follow up visit in 3 weeks for evaluation before starting cycle #37.   Provided the patient with constipation education. He will try to use a laxative upon returning home today. Encouraged him to use MiraLAX, or other laxatives such as mag citrate or milk of mag. Patient denies any s/sx of bowel obstruction. No electrolyte abnormalities. Cautioned the patient to let us know if no improvement in his constipation or if he develops new or worsening symptoms such as nausea, vomiting, abdominal pain, etc.   The patient was advised  to call immediately if he has any concerning symptoms in the interval. The patient voices understanding of current disease status and treatment options and is in agreement with the current care plan. All questions were answered. The patient knows to call the clinic with any problems, questions or concerns. We can certainly see the patient much sooner if necessary   Orders Placed This Encounter  Procedures  . CT Chest W Contrast    Standing Status:   Future    Standing Expiration Date:   05/27/2020    Order Specific Question:   ** REASON FOR EXAM (FREE TEXT)    Answer:   Restaging Lung cancer with history of Met to left femur and occassional left knee pain    Order Specific Question:   If indicated for the ordered procedure, I authorize the administration of contrast media per Radiology protocol    Answer:   Yes    Order Specific Question:   Preferred imaging location?    Answer:   [facility]    Order Specific Question:   Radiology Contrast Protocol - do NOT  remove file path    Answer:   \\[email or web address]\Radiant\CTProtocols.pdf  . CT Abdomen Pelvis W Contrast    Standing Status:   Future    Standing Expiration Date:   05/27/2020    Order Specific Question:   ** REASON FOR EXAM (FREE TEXT)    Answer:   Restaging Lung cancer with history of metastatic lung cancer to the left femur and current knee pain.    Order Specific Question:   If indicated for the ordered procedure, I authorize the administration of contrast media per Radiology protocol    Answer:   Yes    Order Specific Question:   Preferred imaging location?    Answer:   [facility]    Order Specific Question:   Is Oral Contrast requested for this exam?    Answer:   Yes, Per Radiology protocol    Order Specific Question:   Radiology Contrast Protocol - do NOT remove file path    Answer:   \\[email or web address]\[email or web address].pdf  . CT FEMUR LEFT W CONTRAST    Standing Status:   Future    Standing Expiration Date:   08/24/2020    Order Specific Question:   ** REASON FOR EXAM (FREE TEXT)    Answer:   Restaging Lung Cancer, history of mets to the left femur and progressive left knee pain    Order Specific Question:   If indicated for the ordered procedure, I authorize the administration of contrast media per Radiology protocol    Answer:   Yes    Order Specific Question:   Preferred imaging location?    Answer:   [facility]    Order Specific Question:   Radiology Contrast Protocol - do NOT remove file path    Answer:   \\[email or web address]\[email or web address]\CTProtocols.pdf  . CBC with Differential ([facility] Only)    Standing Status:   Standing    Number of Occurrences:   18    Standing Expiration Date:   05/27/2020  . CMP ([place] only)    Standing Status:   Standing    Number of Occurrences:   18    Standing Expiration Date:   05/27/2020  . TSH    Standing Status:   Standing    Number of Occurrences:   18    Standing Expiration Date:   05/27/2020      Victor Lambert L Momina Hunton, PA-C  05/28/19

## 2019-05-28 ENCOUNTER — Other Ambulatory Visit: Payer: Self-pay

## 2019-05-28 ENCOUNTER — Inpatient Hospital Stay: Payer: Medicare HMO

## 2019-05-28 ENCOUNTER — Inpatient Hospital Stay (HOSPITAL_BASED_OUTPATIENT_CLINIC_OR_DEPARTMENT_OTHER): Payer: Medicare HMO | Admitting: Physician Assistant

## 2019-05-28 ENCOUNTER — Encounter: Payer: Self-pay | Admitting: Physician Assistant

## 2019-05-28 VITALS — BP 126/78 | HR 64 | Temp 98.3°F | Resp 18 | Ht 74.0 in | Wt 259.9 lb

## 2019-05-28 DIAGNOSIS — Z923 Personal history of irradiation: Secondary | ICD-10-CM | POA: Diagnosis not present

## 2019-05-28 DIAGNOSIS — C3492 Malignant neoplasm of unspecified part of left bronchus or lung: Secondary | ICD-10-CM

## 2019-05-28 DIAGNOSIS — Z902 Acquired absence of lung [part of]: Secondary | ICD-10-CM | POA: Diagnosis not present

## 2019-05-28 DIAGNOSIS — Z5112 Encounter for antineoplastic immunotherapy: Secondary | ICD-10-CM

## 2019-05-28 DIAGNOSIS — C7951 Secondary malignant neoplasm of bone: Secondary | ICD-10-CM | POA: Diagnosis not present

## 2019-05-28 DIAGNOSIS — E039 Hypothyroidism, unspecified: Secondary | ICD-10-CM | POA: Diagnosis not present

## 2019-05-28 DIAGNOSIS — E032 Hypothyroidism due to medicaments and other exogenous substances: Secondary | ICD-10-CM

## 2019-05-28 DIAGNOSIS — Z7901 Long term (current) use of anticoagulants: Secondary | ICD-10-CM | POA: Diagnosis not present

## 2019-05-28 DIAGNOSIS — I4819 Other persistent atrial fibrillation: Secondary | ICD-10-CM | POA: Diagnosis not present

## 2019-05-28 DIAGNOSIS — Z9221 Personal history of antineoplastic chemotherapy: Secondary | ICD-10-CM | POA: Diagnosis not present

## 2019-05-28 DIAGNOSIS — C3432 Malignant neoplasm of lower lobe, left bronchus or lung: Secondary | ICD-10-CM | POA: Diagnosis not present

## 2019-05-28 LAB — CBC WITH DIFFERENTIAL (CANCER CENTER ONLY)
Abs Immature Granulocytes: 0.02 10*3/uL (ref 0.00–0.07)
Basophils Absolute: 0 10*3/uL (ref 0.0–0.1)
Basophils Relative: 0 %
Eosinophils Absolute: 0.1 10*3/uL (ref 0.0–0.5)
Eosinophils Relative: 1 %
HCT: 45.6 % (ref 39.0–52.0)
Hemoglobin: 16.2 g/dL (ref 13.0–17.0)
Immature Granulocytes: 0 %
Lymphocytes Relative: 20 %
Lymphs Abs: 1.4 10*3/uL (ref 0.7–4.0)
MCH: 34 pg (ref 26.0–34.0)
MCHC: 35.5 g/dL (ref 30.0–36.0)
MCV: 95.8 fL (ref 80.0–100.0)
Monocytes Absolute: 0.6 10*3/uL (ref 0.1–1.0)
Monocytes Relative: 8 %
Neutro Abs: 4.8 10*3/uL (ref 1.7–7.7)
Neutrophils Relative %: 71 %
Platelet Count: 157 10*3/uL (ref 150–400)
RBC: 4.76 MIL/uL (ref 4.22–5.81)
RDW: 12.7 % (ref 11.5–15.5)
WBC Count: 6.9 10*3/uL (ref 4.0–10.5)
nRBC: 0 % (ref 0.0–0.2)

## 2019-05-28 LAB — CMP (CANCER CENTER ONLY)
ALT: 18 U/L (ref 0–44)
AST: 19 U/L (ref 15–41)
Albumin: 4 g/dL (ref 3.5–5.0)
Alkaline Phosphatase: 89 U/L (ref 38–126)
Anion gap: 9 (ref 5–15)
BUN: 13 mg/dL (ref 8–23)
CO2: 26 mmol/L (ref 22–32)
Calcium: 8.9 mg/dL (ref 8.9–10.3)
Chloride: 107 mmol/L (ref 98–111)
Creatinine: 0.93 mg/dL (ref 0.61–1.24)
GFR, Est AFR Am: >60 mL/min (ref >60)
GFR, Estimated: >60 mL/min (ref >60)
Glucose, Bld: 113 mg/dL — ABNORMAL HIGH (ref 70–99)
Potassium: 4.3 mmol/L (ref 3.5–5.1)
Sodium: 142 mmol/L (ref 135–145)
Total Bilirubin: 1 mg/dL (ref 0.3–1.2)
Total Protein: 6.5 g/dL (ref 6.5–8.1)

## 2019-05-28 LAB — TSH: TSH: 1.277 u[IU]/mL (ref 0.320–4.118)

## 2019-05-28 MED ORDER — SODIUM CHLORIDE 0.9 % IV SOLN
Freq: Once | INTRAVENOUS | Status: AC
  Filled 2019-05-28: qty 250

## 2019-05-28 MED ORDER — HEPARIN SOD (PORK) LOCK FLUSH 100 UNIT/ML IV SOLN
500.0000 [IU] | Freq: Once | INTRAVENOUS | Status: AC | PRN
  Administered 2019-05-28: 500 [IU]
  Filled 2019-05-28: qty 5

## 2019-05-28 MED ORDER — SODIUM CHLORIDE 0.9 % IV SOLN
200.0000 mg | Freq: Once | INTRAVENOUS | Status: AC
  Administered 2019-05-28: 200 mg via INTRAVENOUS
  Filled 2019-05-28: qty 8

## 2019-05-28 MED ORDER — SODIUM CHLORIDE 0.9% FLUSH
10.0000 mL | INTRAVENOUS | Status: DC | PRN
  Administered 2019-05-28: 10 mL
  Filled 2019-05-28: qty 10

## 2019-05-28 NOTE — Patient Instructions (Signed)

## 2019-05-29 ENCOUNTER — Telehealth: Payer: Self-pay | Admitting: Physician Assistant

## 2019-05-29 NOTE — Telephone Encounter (Signed)
Scheduled 4/8 and 4/29 per los. Pt to get updated calendar at next appt

## 2019-06-05 ENCOUNTER — Ambulatory Visit: Payer: Medicare HMO | Attending: Internal Medicine

## 2019-06-05 DIAGNOSIS — Z23 Encounter for immunization: Secondary | ICD-10-CM | POA: Insufficient documentation

## 2019-06-05 NOTE — Progress Notes (Signed)
   Covid-19 Vaccination Clinic  Name:  RYO KLANG    MRN: [id number] DOB: 1950/04/29  06/05/2019  Mr. Mochizuki was observed post Covid-19 immunization for 15 minutes without incident. He was provided with Vaccine Information Sheet and instruction to access the V-Safe system.   Mr. Lucken was instructed to call 911 with any severe reactions post vaccine: Marland Kitchen Difficulty breathing  . Swelling of face and throat  . A fast heartbeat  . A bad rash all over body  . Dizziness and weakness   Immunizations Administered    Name Date Dose VIS Date Route   Pfizer COVID-19 Vaccine 06/05/2019  5:28 PM 0.3 mL 03/13/2019 Intramuscular   Manufacturer: [place]   Lot: [id number]   [place]: [id number]

## 2019-06-08 ENCOUNTER — Ambulatory Visit: Payer: Medicare HMO | Admitting: Family Medicine

## 2019-06-15 ENCOUNTER — Other Ambulatory Visit: Payer: Self-pay

## 2019-06-15 ENCOUNTER — Encounter (HOSPITAL_COMMUNITY): Payer: Self-pay

## 2019-06-15 ENCOUNTER — Ambulatory Visit (HOSPITAL_COMMUNITY)
Admission: RE | Admit: 2019-06-15 | Discharge: 2019-06-15 | Disposition: A | Discharge status: Home / Self Care | Payer: Medicare HMO | Source: Ambulatory Visit | Attending: Physician Assistant | Admitting: Physician Assistant

## 2019-06-15 DIAGNOSIS — M25562 Pain in left knee: Secondary | ICD-10-CM | POA: Diagnosis not present

## 2019-06-15 DIAGNOSIS — C3490 Malignant neoplasm of unspecified part of unspecified bronchus or lung: Secondary | ICD-10-CM | POA: Diagnosis not present

## 2019-06-15 DIAGNOSIS — C3492 Malignant neoplasm of unspecified part of left bronchus or lung: Secondary | ICD-10-CM | POA: Insufficient documentation

## 2019-06-15 HISTORY — DX: Secondary malignant neoplasm of bone: C79.51

## 2019-06-15 MED ORDER — IOHEXOL 9 MG/ML PO SOLN
500.0000 mL | ORAL | Status: AC
  Administered 2019-06-15 (×2): 500 mL via ORAL

## 2019-06-15 MED ORDER — IOHEXOL 9 MG/ML PO SOLN
ORAL | Status: AC
  Filled 2019-06-15: qty 1000

## 2019-06-15 MED ORDER — HEPARIN SOD (PORK) LOCK FLUSH 100 UNIT/ML IV SOLN
500.0000 [IU] | Freq: Once | INTRAVENOUS | Status: AC
  Administered 2019-06-15: 500 [IU] via INTRAVENOUS

## 2019-06-15 MED ORDER — SODIUM CHLORIDE (PF) 0.9 % IJ SOLN
INTRAMUSCULAR | Status: AC
  Filled 2019-06-15: qty 50

## 2019-06-15 MED ORDER — IOHEXOL 300 MG/ML  SOLN
100.0000 mL | Freq: Once | INTRAMUSCULAR | Status: AC | PRN
  Administered 2019-06-15: 100 mL via INTRAVENOUS

## 2019-06-15 MED ORDER — HEPARIN SOD (PORK) LOCK FLUSH 100 UNIT/ML IV SOLN
INTRAVENOUS | Status: AC
  Filled 2019-06-15: qty 5

## 2019-06-18 ENCOUNTER — Inpatient Hospital Stay (HOSPITAL_BASED_OUTPATIENT_CLINIC_OR_DEPARTMENT_OTHER): Payer: Medicare HMO | Admitting: Internal Medicine

## 2019-06-18 ENCOUNTER — Inpatient Hospital Stay: Payer: Medicare HMO | Attending: Internal Medicine

## 2019-06-18 ENCOUNTER — Inpatient Hospital Stay: Payer: Medicare HMO

## 2019-06-18 ENCOUNTER — Encounter: Payer: Self-pay | Admitting: Internal Medicine

## 2019-06-18 ENCOUNTER — Other Ambulatory Visit: Payer: Self-pay

## 2019-06-18 VITALS — BP 127/69 | HR 49 | Temp 98.3°F | Resp 20 | Ht 74.0 in | Wt 256.2 lb

## 2019-06-18 DIAGNOSIS — Z5112 Encounter for antineoplastic immunotherapy: Secondary | ICD-10-CM | POA: Diagnosis not present

## 2019-06-18 DIAGNOSIS — Z902 Acquired absence of lung [part of]: Secondary | ICD-10-CM | POA: Diagnosis not present

## 2019-06-18 DIAGNOSIS — Z79899 Other long term (current) drug therapy: Secondary | ICD-10-CM | POA: Insufficient documentation

## 2019-06-18 DIAGNOSIS — Z95828 Presence of other vascular implants and grafts: Secondary | ICD-10-CM

## 2019-06-18 DIAGNOSIS — C770 Secondary and unspecified malignant neoplasm of lymph nodes of head, face and neck: Secondary | ICD-10-CM | POA: Diagnosis not present

## 2019-06-18 DIAGNOSIS — R590 Localized enlarged lymph nodes: Secondary | ICD-10-CM | POA: Insufficient documentation

## 2019-06-18 DIAGNOSIS — Z9221 Personal history of antineoplastic chemotherapy: Secondary | ICD-10-CM | POA: Diagnosis not present

## 2019-06-18 DIAGNOSIS — Z923 Personal history of irradiation: Secondary | ICD-10-CM | POA: Insufficient documentation

## 2019-06-18 DIAGNOSIS — C3432 Malignant neoplasm of lower lobe, left bronchus or lung: Secondary | ICD-10-CM | POA: Diagnosis not present

## 2019-06-18 DIAGNOSIS — C3492 Malignant neoplasm of unspecified part of left bronchus or lung: Secondary | ICD-10-CM

## 2019-06-18 DIAGNOSIS — C7951 Secondary malignant neoplasm of bone: Secondary | ICD-10-CM

## 2019-06-18 DIAGNOSIS — E039 Hypothyroidism, unspecified: Secondary | ICD-10-CM

## 2019-06-18 DIAGNOSIS — C3490 Malignant neoplasm of unspecified part of unspecified bronchus or lung: Secondary | ICD-10-CM

## 2019-06-18 LAB — CMP (CANCER CENTER ONLY)
ALT: 21 U/L (ref 0–44)
AST: 19 U/L (ref 15–41)
Albumin: 3.9 g/dL (ref 3.5–5.0)
Alkaline Phosphatase: 81 U/L (ref 38–126)
Anion gap: 10 (ref 5–15)
BUN: 13 mg/dL (ref 8–23)
CO2: 24 mmol/L (ref 22–32)
Calcium: 9 mg/dL (ref 8.9–10.3)
Chloride: 105 mmol/L (ref 98–111)
Creatinine: 0.88 mg/dL (ref 0.61–1.24)
GFR, Est AFR Am: >60 mL/min (ref >60)
GFR, Estimated: >60 mL/min (ref >60)
Glucose, Bld: 118 mg/dL — ABNORMAL HIGH (ref 70–99)
Potassium: 4.4 mmol/L (ref 3.5–5.1)
Sodium: 139 mmol/L (ref 135–145)
Total Bilirubin: 1.1 mg/dL (ref 0.3–1.2)
Total Protein: 6.5 g/dL (ref 6.5–8.1)

## 2019-06-18 LAB — CBC WITH DIFFERENTIAL (CANCER CENTER ONLY)
Abs Immature Granulocytes: 0.02 10*3/uL (ref 0.00–0.07)
Basophils Absolute: 0 10*3/uL (ref 0.0–0.1)
Basophils Relative: 0 %
Eosinophils Absolute: 0.1 10*3/uL (ref 0.0–0.5)
Eosinophils Relative: 1 %
HCT: 45.6 % (ref 39.0–52.0)
Hemoglobin: 16.3 g/dL (ref 13.0–17.0)
Immature Granulocytes: 0 %
Lymphocytes Relative: 24 %
Lymphs Abs: 1.8 10*3/uL (ref 0.7–4.0)
MCH: 34 pg (ref 26.0–34.0)
MCHC: 35.7 g/dL (ref 30.0–36.0)
MCV: 95 fL (ref 80.0–100.0)
Monocytes Absolute: 0.6 10*3/uL (ref 0.1–1.0)
Monocytes Relative: 8 %
Neutro Abs: 4.9 10*3/uL (ref 1.7–7.7)
Neutrophils Relative %: 67 %
Platelet Count: 176 10*3/uL (ref 150–400)
RBC: 4.8 MIL/uL (ref 4.22–5.81)
RDW: 12.7 % (ref 11.5–15.5)
WBC Count: 7.5 10*3/uL (ref 4.0–10.5)
nRBC: 0 % (ref 0.0–0.2)

## 2019-06-18 LAB — TSH: TSH: 1.085 u[IU]/mL (ref 0.320–4.118)

## 2019-06-18 MED ORDER — HEPARIN SOD (PORK) LOCK FLUSH 100 UNIT/ML IV SOLN
500.0000 [IU] | Freq: Once | INTRAVENOUS | Status: AC | PRN
  Administered 2019-06-18: 500 [IU]
  Filled 2019-06-18: qty 5

## 2019-06-18 MED ORDER — HEPARIN SOD (PORK) LOCK FLUSH 100 UNIT/ML IV SOLN
500.0000 [IU] | Freq: Once | INTRAVENOUS | Status: DC | PRN
  Filled 2019-06-18: qty 5

## 2019-06-18 MED ORDER — SODIUM CHLORIDE 0.9% FLUSH
10.0000 mL | INTRAVENOUS | Status: DC | PRN
  Administered 2019-06-18: 10 mL
  Filled 2019-06-18: qty 10

## 2019-06-18 MED ORDER — SODIUM CHLORIDE 0.9 % IV SOLN
Freq: Once | INTRAVENOUS | Status: AC
  Filled 2019-06-18: qty 250

## 2019-06-18 MED ORDER — SODIUM CHLORIDE 0.9 % IV SOLN
200.0000 mg | Freq: Once | INTRAVENOUS | Status: AC
  Administered 2019-06-18: 200 mg via INTRAVENOUS
  Filled 2019-06-18: qty 8

## 2019-06-18 NOTE — Patient Instructions (Signed)
[facility] Discharge Instructions for Patients Receiving Chemotherapy  Today you received the following chemotherapy agents:  Keytruda.  To help prevent nausea and vomiting after your treatment, we encourage you to take your nausea medication as directed.   If you develop nausea and vomiting that is not controlled by your nausea medication, call the clinic.   BELOW ARE SYMPTOMS THAT SHOULD BE REPORTED IMMEDIATELY:  *FEVER GREATER THAN 100.5 F  *CHILLS WITH OR WITHOUT FEVER  NAUSEA AND VOMITING THAT IS NOT CONTROLLED WITH YOUR NAUSEA MEDICATION  *UNUSUAL SHORTNESS OF BREATH  *UNUSUAL BRUISING OR BLEEDING  TENDERNESS IN MOUTH AND THROAT WITH OR WITHOUT PRESENCE OF ULCERS  *URINARY PROBLEMS  *BOWEL PROBLEMS  UNUSUAL RASH Items with * indicate a potential emergency and should be followed up as soon as possible.  Feel free to call the clinic should you have any questions or concerns. The clinic phone number is [phone].  Please show the CHEMO ALERT CARD at check-in to the Emergency Department and triage nurse.    
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
## 2019-06-30 NOTE — Progress Notes (Signed)
Nurse connected with patient 07/01/19 at  3:15 PM EDT by a telephone enabled telemedicine application and verified that I am speaking with the correct person using two identifiers. Patient stated full name and DOB. Patient gave permission to continue with virtual visit. Patient's location was at home and Nurse's location was at [place] office.   Subjective:   Victor Huber is a 69 y.o. male who presents for an Initial Medicare Annual Wellness Visit.  Review of Systems  Home Safety/Smoke Alarms: Feels safe in home. Smoke alarms in place.  Lives w/ wife in 1 story home.  Male:   [facility]- will discuss w/ PCP at appt 07/06/19    PSA-  Lab Results  Component Value Date   PSA 1.64 03/06/2019      Objective:     Advanced Directives 07/01/2019 05/07/2019 08/21/2018 05/22/2018 05/01/2018 02/26/2018 02/06/2018  Does Patient Have a Medical Advance Directive? No No No No No No No  Does patient want to make changes to medical advance directive? - - - - - - -  Would patient like information on creating a medical advance directive? No - Patient declined No - Patient declined - No - Patient declined - - -    Current Medications (verified) Outpatient Encounter Medications as of 07/01/2019  Medication Sig  . albuterol (VENTOLIN HFA) 108 (90 Base) MCG/ACT inhaler Inhale 1-2 puffs into the lungs every 4 (four) hours as needed for wheezing or shortness of breath. 3 month supply  . apixaban (ELIQUIS) 5 MG TABS tablet Take 1 tablet (5 mg total) by mouth 2 (two) times daily.  Marland Kitchen atenolol (TENORMIN) 50 MG tablet TAKE 1 TABLET (50 MG TOTAL) BY MOUTH DAILY.  Marland Kitchen atorvastatin (LIPITOR) 40 MG tablet Take 1 tablet (40 mg total) by mouth daily.  . citalopram (CELEXA) 40 MG tablet Take 1 tablet (40 mg total) by mouth daily.  . famotidine (PEPCID) 20 MG tablet Take 20 mg by mouth 2 (two) times daily.  Marland Kitchen levothyroxine (SYNTHROID) 150 MCG tablet Take 150 mcg by mouth daily before breakfast.  . levothyroxine (SYNTHROID) 25  MCG tablet TAKE 1 TABLET EVERY DAY BEFORE BREAKFAST  . Oxycodone HCl 10 MG TABS Take one daily as needed for severe back pain.  . tamsulosin (FLOMAX) 0.4 MG CAPS capsule Take 1 capsule (0.4 mg total) by mouth daily.  Marland Kitchen ibuprofen (ADVIL,MOTRIN) 400 MG tablet Take 400 mg by mouth every 4 (four) hours as needed for moderate pain.   No facility-administered encounter medications on file as of 07/01/2019.    Allergies (verified) Carboplatin and Flecainide   History: Past Medical History:  Diagnosis Date  . Adenocarcinoma of left lung, stage 3 ([place]) 08/15/2016  . Arthritis   . Atrial fibrillation ([place]) 08/15/2016  . Atrial fibrillation ([place])   . Cancer, metastatic to bone ([place])   . COPD (chronic obstructive pulmonary disease) ([place]) 08/15/2016  . History of chemotherapy   . History of radiation therapy   . Hyperlipidemia   . Hypothyroid 08/15/2016  . Longstanding persistent atrial fibrillation ([place]) 08/29/2016  . Pathologic fracture    left femur  . Pneumonitis   . S/P TURP 08/15/2016  . Wears glasses   . Wears hearing aid in both ears    Past Surgical History:  Procedure Laterality Date  . BRONCHOSCOPY  10/2014  . CARDIAC CATHETERIZATION     05/07/12  . CARDIOVERSION     x2  . COLONOSCOPY    . DG BIOPSY LUNG Left 10/2014   FNA -  Adenocarcinoma   . FEMUR IM NAIL Left 02/19/2017  . FEMUR IM NAIL Left 02/19/2017   Procedure: INTRAMEDULLARY (IM) NAIL FEMORAL;  Surgeon: Marchia Bond, MD;  Location: [facility];  Service: Orthopedics;  Laterality: Left;  . IR FLUORO GUIDE PORT INSERTION RIGHT  07/03/2017  . IR US GUIDE VASC ACCESS RIGHT  07/03/2017  . LUNG CANCER SURGERY Left 12/2014   Wedge Resection   . MULTIPLE TOOTH EXTRACTIONS    . Status post TURP    . TONSILLECTOMY     Family History  Problem Relation Age of Onset  . Breast cancer Mother   . Breast cancer Maternal Aunt   . Breast cancer Maternal Aunt   . Lung disease Neg Hx    Social History   Socioeconomic History  . Marital  status: Married    Spouse name: Not on file  . Number of children: Not on file  . Years of education: Not on file  . Highest education level: Not on file  Occupational History  . Not on file  Tobacco Use  . Smoking status: Former Smoker    Packs/day: 1.00    Years: 50.00    Pack years: 50.00    Types: Cigarettes    Quit date: 08/15/2012    Years since quitting: 6.8  . Smokeless tobacco: Never Used  Substance and Sexual Activity  . Alcohol use: No  . Drug use: No  . Sexual activity: Not on file  Other Topics Concern  . Not on file  Social History Narrative   [facility] (09/26/16):   Originally from [place]. Moved to [facility] February 2018. Always lived in [place]. Moved to be closer to children & grandchildren. No international travel. 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 No mold exposure. No bird exposure or hot tub exposure. Enjoys reading. Previously enjoyed wood working with domestic woods.    Social Determinants of Health   Financial Resource Strain:   . Difficulty of Paying Living Expenses:   Food Insecurity:   . Worried About Charity fundraiser in the Last Year:   . Arboriculturist in the Last Year:   Transportation Needs:   . Film/video editor (Medical):   Marland Kitchen Lack of Transportation (Non-Medical):   Physical Activity:   . Days of Exercise per Week:   . Minutes of Exercise per Session:   Stress:   . Feeling of Stress :   Social Connections:   . Frequency of Communication with Friends and Family:   . Frequency of Social Gatherings with Friends and Family:   . Attends Religious Services:   . Active Member of Clubs or Organizations:   . Attends Archivist Meetings:   Marland Kitchen Marital Status:    Tobacco Counseling Counseling given: Not Answered   Clinical Intake: Pain : No/denies pain  Activities of Daily Living In your present state of health, do you have any difficulty  performing the following activities: 07/01/2019  Hearing? Y  Comment wears hearing aids.  Vision? N  Difficulty concentrating or making decisions? N  Walking or climbing stairs? N  Dressing or bathing? N  Doing errands, shopping? N  Preparing Food and eating ? N  Using the Toilet? N  In the past six months, have you accidently leaked urine? N  Do you have problems with loss of bowel control? N  Managing your Medications? N  Managing your Finances? N  Housekeeping or  managing your Housekeeping? N  Some recent data might be hidden     Immunizations and Health Maintenance Immunization History  Administered Date(s) Administered  . Fluad Quad(high Dose 65+) 03/10/2019  . Influenza-Unspecified 03/28/2016, 01/16/2017  . PFIZER SARS-COV-2 Vaccination 05/11/2019, 06/05/2019  . Pneumococcal Conjugate-13 11/28/2016  . Pneumococcal-Unspecified 09/27/2014   Health Maintenance Due  Topic Date Due  . Hepatitis C Screening  Never done  . COLONOSCOPY  Never done    Patient Care Team: [name], MD as PCP - General (Family Medicine)  Indicate any recent Medical Services you may have received from other than [facility] providers in the past year (date may be approximate).    Assessment:   This is a routine wellness examination for [place]. Physical assessment deferred to PCP.   Hearing/Vision screen Unable to assess. This visit is enabled though telemedicine due to Covid 19.   Dietary issues and exercise activities discussed: Current Exercise Habits: The patient does not participate in regular exercise at present, Exercise limited by: respiratory conditions(s) Diet (meal preparation, eat out, water intake, caffeinated beverages, dairy products, fruits and vegetables): well balanced, on average, 2 meals per day      Goals    . Increase physical activity      Depression Screen PHQ 2/9 Scores 07/01/2019 03/10/2019 01/05/2019  PHQ - 2 Score 0 0 0    Fall Risk Fall Risk   07/01/2019 03/10/2019 01/05/2019 01/18/2017  Falls in the past year? 0 1 1 No  Number falls in past yr: 0 0 0 -  Injury with Fall? 0 0 1 -  Comment - - RIGHT SHOULDER -  Follow up Education provided;Falls prevention discussed - Falls evaluation completed -    Cognitive Function:   Ad8 score reviewed for issues:  Issues making decisions:no  Less interest in hobbies / activities:no  Repeats questions, stories (family complaining):no  Trouble using ordinary gadgets (microwave, computer, phone):no  Forgets the month or year: no  Mismanaging finances: no  Remembering appts:no  Daily problems with thinking and/or memory:no Ad8 score is=0      Screening Tests Health Maintenance  Topic Date Due  . Hepatitis C Screening  Never done  . COLONOSCOPY  Never done  . TETANUS/TDAP  01/05/2020 (Originally 09/29/1969)  . INFLUENZA VACCINE  Completed  . PNA vac Low Risk Adult  Completed        Plan:    Please schedule your next medicare wellness visit with me in 1 yr.  Continue to eat heart healthy diet (full of fruits, vegetables, whole grains, lean protein, water--limit salt, fat, and sugar intake) and increase physical activity as tolerated.  Continue doing brain stimulating activities (puzzles, reading, adult coloring books, staying active) to keep memory sharp.     I have personally reviewed and noted the following in the patient's chart:   . Medical and social history . Use of alcohol, tobacco or illicit drugs  . Current medications and supplements . Functional ability and status . Nutritional status . Physical activity . Advanced directives . List of other physicians . Hospitalizations, surgeries, and ER visits in previous 12 months . Vitals . Screenings to include cognitive, depression, and falls . Referrals and appointments  In addition, I have reviewed and discussed with patient certain preventive protocols, quality metrics, and best practice recommendations. A  written personalized care plan for preventive services as well as general preventive health recommendations were provided to patient.     Shela Nevin, [place]   07/01/2019

## 2019-07-01 ENCOUNTER — Ambulatory Visit (INDEPENDENT_AMBULATORY_CARE_PROVIDER_SITE_OTHER): Payer: Medicare HMO | Admitting: *Deleted

## 2019-07-01 ENCOUNTER — Encounter: Payer: Self-pay | Admitting: *Deleted

## 2019-07-01 DIAGNOSIS — Z0000 Encounter for general adult medical examination without abnormal findings: Secondary | ICD-10-CM

## 2019-07-01 NOTE — Patient Instructions (Signed)
Please schedule your next medicare wellness visit with me in 1 yr.  Continue to eat heart healthy diet (full of fruits, vegetables, whole grains, lean protein, water--limit salt, fat, and sugar intake) and increase physical activity as tolerated.  Continue doing brain stimulating activities (puzzles, reading, adult coloring books, staying active) to keep memory sharp.    [name] , Thank you for taking time to come for your Medicare Wellness Visit. I appreciate your ongoing commitment to your health goals. Please review the following plan we discussed and let me know if I can assist you in the future.   These are the goals we discussed: Goals    . Increase physical activity       This is a list of the screening recommended for you and due dates:  Health Maintenance  Topic Date Due  .  Hepatitis C: One time screening is recommended by Center for Disease Control  (CDC) for  adults born from [birth year] through 1967.   Never done  . Colon Cancer Screening  Never done  . Tetanus Vaccine  01/05/2020*  . Flu Shot  Completed  . Pneumonia vaccines  Completed  *Topic was postponed. The date shown is not the original due date.    Preventive Care 88 Years and Older, Male Preventive care refers to lifestyle choices and visits with your health care provider that can promote health and wellness. This includes:  A yearly physical exam. This is also called an annual well check.  Regular dental and eye exams.  Immunizations.  Screening for certain conditions.  Healthy lifestyle choices, such as diet and exercise. What can I expect for my preventive care visit? Physical exam Your health care provider will check:  Height and weight. These may be used to calculate body mass index (BMI), which is a measurement that tells if you are at a healthy weight.  Heart rate and blood pressure.  Your skin for abnormal spots. Counseling Your health care provider may ask you questions about:  Alcohol,  tobacco, and drug use.  Emotional well-being.  Home and relationship well-being.  Sexual activity.  Eating habits.  History of falls.  Memory and ability to understand (cognition).  Work and work Statistician. What immunizations do I need?  Influenza (flu) vaccine  This is recommended every year. Tetanus, diphtheria, and pertussis (Tdap) vaccine  You may need a Td booster every 10 years. Varicella (chickenpox) vaccine  You may need this vaccine if you have not already been vaccinated. Zoster (shingles) vaccine  You may need this after age 82. Pneumococcal conjugate (PCV13) vaccine  One dose is recommended after age 72. Pneumococcal polysaccharide (PPSV23) vaccine  One dose is recommended after age 29. Measles, mumps, and rubella (MMR) vaccine  You may need at least one dose of MMR if you were born in 1957 or later. You may also need a second dose. Meningococcal conjugate (MenACWY) vaccine  You may need this if you have certain conditions. Hepatitis A vaccine  You may need this if you have certain conditions or if you travel or work in places where you may be exposed to hepatitis A. Hepatitis B vaccine  You may need this if you have certain conditions or if you travel or work in places where you may be exposed to hepatitis B. Haemophilus influenzae type b (Hib) vaccine  You may need this if you have certain conditions. You may receive vaccines as individual doses or as more than one vaccine together in one shot (combination  vaccines). Talk with your health care provider about the risks and benefits of combination vaccines. What tests do I need? Blood tests  Lipid and cholesterol levels. These may be checked every 5 years, or more frequently depending on your overall health.  Hepatitis C test.  Hepatitis B test. Screening  Lung cancer screening. You may have this screening every year starting at age 77 if you have a 30-pack-year history of smoking and  currently smoke or have quit within the past 15 years.  Colorectal cancer screening. All adults should have this screening starting at age 7 and continuing until age 4. Your health care provider may recommend screening at age 39 if you are at increased risk. You will have tests every 1-10 years, depending on your results and the type of screening test.  Prostate cancer screening. Recommendations will vary depending on your family history and other risks.  Diabetes screening. This is done by checking your blood sugar (glucose) after you have not eaten for a while (fasting). You may have this done every 1-3 years.  Abdominal aortic aneurysm (AAA) screening. You may need this if you are a current or former smoker.  Sexually transmitted disease (STD) testing. Follow these instructions at home: Eating and drinking  Eat a diet that includes fresh fruits and vegetables, whole grains, lean protein, and low-fat dairy products. Limit your intake of foods with high amounts of sugar, saturated fats, and salt.  Take vitamin and mineral supplements as recommended by your health care provider.  Do not drink alcohol if your health care provider tells you not to drink.  If you drink alcohol: ? Limit how much you have to 0-2 drinks a day. ? Be aware of how much alcohol is in your drink. In the [place] one drink equals one 12 oz bottle of beer (355 mL), one 5 oz glass of wine (148 mL), or one 1 oz glass of hard liquor (44 mL). Lifestyle  Take daily care of your teeth and gums.  Stay active. Exercise for at least 30 minutes on 5 or more days each week.  Do not use any products that contain nicotine or tobacco, such as cigarettes, e-cigarettes, and chewing tobacco. If you need help quitting, ask your health care provider.  If you are sexually active, practice safe sex. Use a condom or other form of protection to prevent STIs (sexually transmitted infections).  Talk with your health care provider about  taking a low-dose aspirin or statin. What's next?  Visit your health care provider once a year for a well check visit.  Ask your health care provider how often you should have your eyes and teeth checked.  Stay up to date on all vaccines. This information is not intended to replace advice given to you by your health care provider. Make sure you discuss any questions you have with your health care provider. Document Revised: 03/13/2018 Document Reviewed: 03/13/2018 Elsevier Patient Education  2020 [employer].

## 2019-07-03 NOTE — Progress Notes (Signed)

## 2019-07-06 ENCOUNTER — Encounter: Payer: Self-pay | Admitting: Family Medicine

## 2019-07-06 ENCOUNTER — Ambulatory Visit (INDEPENDENT_AMBULATORY_CARE_PROVIDER_SITE_OTHER): Payer: Medicare HMO | Admitting: Family Medicine

## 2019-07-06 ENCOUNTER — Other Ambulatory Visit: Payer: Self-pay | Admitting: Family Medicine

## 2019-07-06 ENCOUNTER — Other Ambulatory Visit: Payer: Self-pay

## 2019-07-06 VITALS — BP 110/76 | HR 96 | Temp 98.0°F | Ht 74.0 in | Wt 257.8 lb

## 2019-07-06 DIAGNOSIS — H6121 Impacted cerumen, right ear: Secondary | ICD-10-CM

## 2019-07-06 DIAGNOSIS — R1013 Epigastric pain: Secondary | ICD-10-CM

## 2019-07-06 DIAGNOSIS — K8020 Calculus of gallbladder without cholecystitis without obstruction: Secondary | ICD-10-CM | POA: Diagnosis not present

## 2019-07-06 DIAGNOSIS — E039 Hypothyroidism, unspecified: Secondary | ICD-10-CM

## 2019-07-06 DIAGNOSIS — I482 Chronic atrial fibrillation, unspecified: Secondary | ICD-10-CM | POA: Diagnosis not present

## 2019-07-06 MED ORDER — DEBROX 6.5 % OT SOLN: 5.0000 [drp] | Freq: Two times a day (BID) | OTIC | 0 refills | Status: DC

## 2019-07-06 MED ORDER — OMEPRAZOLE 40 MG PO CPDR: 40.0000 mg | DELAYED_RELEASE_CAPSULE | Freq: Every day | ORAL | 3 refills | Status: DC

## 2019-07-06 NOTE — Progress Notes (Signed)
Established Patient Office Visit  Subjective:  Patient ID: Victor Huber, male    DOB: [birth year]  Age: 69 y.o. MRN: [id number]  CC:  Chief Complaint  Patient presents with  . Follow-up    3 month follow up, c/o stomach pains not able to hear out of right.    HPI [name] presents for evaluation of a 56-monthhistory of significant midepigastric pain.  Seems to be mostly relieved when he takes his famotidine.  Has not noted reflux.  There is no exertional component or shortness of breath.  He does not drink alcohol.  Serial scans of his abdomen have shown small stones within his gallbladder.  Pancreas is normal per scan.  Review of the chart shows normal CBC and CMP drawn on the 18th of last month.  There is fullness in his right ear.  He has a history of ceruminosis.  He is hearing impaired.  History of atrial for.  He is on Eliquis.  Past Medical History:  Diagnosis Date  . Adenocarcinoma of left lung, stage 3 ([facility] 08/15/2016  . Arthritis   . Atrial fibrillation ([facility] 08/15/2016  . Atrial fibrillation ([facility]   . Cancer, metastatic to bone ([facility]   . COPD (chronic obstructive pulmonary disease) ([facility] 08/15/2016  . History of chemotherapy   . History of radiation therapy   . Hyperlipidemia   . Hypothyroid 08/15/2016  . Longstanding persistent atrial fibrillation ([facility] 08/29/2016  . Pathologic fracture    left femur  . Pneumonitis   . S/P TURP 08/15/2016  . Wears glasses   . Wears hearing aid in both ears     Past Surgical History:  Procedure Laterality Date  . BRONCHOSCOPY  10/2014  . CARDIAC CATHETERIZATION     05/07/12  . CARDIOVERSION     x2  . COLONOSCOPY    . DG BIOPSY LUNG Left 10/2014   FNA - Adenocarcinoma   . FEMUR IM NAIL Left 02/19/2017  . FEMUR IM NAIL Left 02/19/2017   Procedure: INTRAMEDULLARY (IM) NAIL FEMORAL;  Surgeon: LMarchia Bond MD;  [facility]  Service: Orthopedics;  Laterality: Left;  . IR FLUORO GUIDE PORT INSERTION RIGHT  07/03/2017   . IR [place] VASC ACCESS RIGHT  07/03/2017  . LUNG CANCER SURGERY Left 12/2014   Wedge Resection   . MULTIPLE TOOTH EXTRACTIONS    . Status post TURP    . TONSILLECTOMY      Family History  Problem Relation Age of Onset  . Breast cancer Mother   . Breast cancer Maternal Aunt   . Breast cancer Maternal Aunt   . Lung disease Neg Hx     Social History   Socioeconomic History  . Marital status: Married    Spouse name: Not on file  . Number of children: Not on file  . Years of education: Not on file  . Highest education level: Not on file  Occupational History  . Not on file  Tobacco Use  . Smoking status: Former Smoker    Packs/day: 1.00    Years: 50.00    Pack years: 50.00    Types: Cigarettes    Quit date: 08/15/2012    Years since quitting: 6.8  . Smokeless tobacco: Never Used  Substance and Sexual Activity  . Alcohol use: No  . Drug use: No  . Sexual activity: Not on file  Other Topics Concern  . Not on file  Social History Narrative   [facility] (09/26/16):  Originally from [place]. Moved to [facility] February 2018. Always lived in [place]. Moved to be closer to children & grandchildren. No international travel. 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 No mold exposure. No bird exposure or hot tub exposure. Enjoys reading. Previously enjoyed wood working with domestic woods.    Social Determinants of Health   Financial Resource Strain: Low Risk   . Difficulty of Paying Living Expenses: Not hard at all  Food Insecurity: No Food Insecurity  . Worried About Charity fundraiser in the Last Year: Never true  . Ran Out of Food in the Last Year: Never true  Transportation Needs: No Transportation Needs  . Lack of Transportation (Medical): No  . Lack of Transportation (Non-Medical): No  Physical Activity:   . Days of Exercise per Week:   . Minutes of Exercise per Session:   Stress:   . Feeling of  Stress :   Social Connections:   . Frequency of Communication with Friends and Family:   . Frequency of Social Gatherings with Friends and Family:   . Attends Religious Services:   . Active Member of Clubs or Organizations:   . Attends Archivist Meetings:   Marland Kitchen Marital Status:   Intimate Partner Violence:   . Fear of Current or Ex-Partner:   . Emotionally Abused:   Marland Kitchen Physically Abused:   . Sexually Abused:     Outpatient Medications Prior to Visit  Medication Sig Dispense Refill  . albuterol (VENTOLIN HFA) 108 (90 Base) MCG/ACT inhaler Inhale 1-2 puffs into the lungs every 4 (four) hours as needed for wheezing or shortness of breath. 3 month supply 24 g 3  . apixaban (ELIQUIS) 5 MG TABS tablet Take 1 tablet (5 mg total) by mouth 2 (two) times daily. 180 tablet 1  . atenolol (TENORMIN) 50 MG tablet TAKE 1 TABLET (50 MG TOTAL) BY MOUTH DAILY. 90 tablet 3  . atorvastatin (LIPITOR) 40 MG tablet Take 1 tablet (40 mg total) by mouth daily. 90 tablet 2  . citalopram (CELEXA) 40 MG tablet Take 1 tablet (40 mg total) by mouth daily. 90 tablet 1  . famotidine (PEPCID) 20 MG tablet Take 20 mg by mouth 2 (two) times daily.    Marland Kitchen ibuprofen (ADVIL,MOTRIN) 400 MG tablet Take 400 mg by mouth every 4 (four) hours as needed for moderate pain.    Marland Kitchen levothyroxine (SYNTHROID) 150 MCG tablet Take 150 mcg by mouth daily before breakfast.    . levothyroxine (SYNTHROID) 25 MCG tablet TAKE 1 TABLET EVERY DAY BEFORE BREAKFAST 90 tablet 0  . tamsulosin (FLOMAX) 0.4 MG CAPS capsule Take 1 capsule (0.4 mg total) by mouth daily. 90 capsule 4  . Oxycodone HCl 10 MG TABS Take one daily as needed for severe back pain. (Patient not taking: Reported on 07/06/2019) 30 tablet 0   No facility-administered medications prior to visit.    Allergies  Allergen Reactions  . Carboplatin Itching, Nausea And Vomiting and Other (See Comments)    Flushing  . Flecainide Hypertension    CAUSED HEART ISSUES     ROS Review  of Systems  Constitutional: Negative.   HENT: Positive for hearing loss. Negative for ear discharge and ear pain.   Eyes: Negative for photophobia and visual disturbance.  Respiratory: Negative.   Cardiovascular: Negative.   Gastrointestinal: Positive for abdominal pain. Negative for abdominal distention, anal bleeding and blood in stool.  Genitourinary: Negative.  Musculoskeletal: Negative for gait problem and joint swelling.  Allergic/Immunologic: Negative for immunocompromised state.  Neurological: Negative for headaches.  Hematological: Negative.   Psychiatric/Behavioral: Negative.       Objective:    Physical Exam  Constitutional: He is oriented to person, place, and time. He appears well-developed and well-nourished. No distress.  HENT:  Head: Normocephalic and atraumatic.  Right Ear: External ear normal. A foreign body is present.  Ears:  Eyes: Conjunctivae are normal. Right eye exhibits no discharge. Left eye exhibits no discharge. No scleral icterus.  Neck: No JVD present. No tracheal deviation present. No thyromegaly present.  Cardiovascular: An irregularly irregular rhythm present.  Pulmonary/Chest: Effort normal and breath sounds normal. No stridor.  Abdominal: Bowel sounds are normal. He exhibits distension. There is abdominal tenderness in the epigastric area. There is no rebound and no guarding. A hernia is present. Hernia confirmed positive in the ventral area.  Musculoskeletal:        General: No edema.  Lymphadenopathy:    He has no cervical adenopathy.  Neurological: He is alert and oriented to person, place, and time.  Skin: Skin is warm and dry. He is not diaphoretic.  Psychiatric: He has a normal mood and affect. His behavior is normal.    BP 110/76   Pulse 96   Temp 98 F (36.7 C) (Tympanic)   Ht 6' 2"  (1.88 m)   Wt 257 lb 12.8 oz (116.9 kg)   SpO2 96%   BMI 33.10 kg/m  Wt Readings from Last 3 Encounters:  07/06/19 257 lb 12.8 oz (116.9 kg)   06/18/19 256 lb 3.2 oz (116.2 kg)  05/28/19 259 lb 14.4 oz (117.9 kg)     Health Maintenance Due  Topic Date Due  . Hepatitis C Screening  Never done  . COLONOSCOPY  Never done    There are no preventive care reminders to display for this patient.  Lab Results  Component Value Date   TSH 1.085 06/18/2019   Lab Results  Component Value Date   WBC 7.5 06/18/2019   HGB 16.3 06/18/2019   HCT 45.6 06/18/2019   MCV 95.0 06/18/2019   PLT 176 06/18/2019   Lab Results  Component Value Date   NA 139 06/18/2019   K 4.4 06/18/2019   CHLORIDE 102 04/04/2017   CO2 24 06/18/2019   GLUCOSE 118 (H) 06/18/2019   BUN 13 06/18/2019   CREATININE 0.88 06/18/2019   BILITOT 1.1 06/18/2019   ALKPHOS 81 06/18/2019   AST 19 06/18/2019   ALT 21 06/18/2019   PROT 6.5 06/18/2019   ALBUMIN 3.9 06/18/2019   CALCIUM 9.0 06/18/2019   ANIONGAP 10 06/18/2019   EGFR >60 04/04/2017   GFR 67.51 03/06/2019   Lab Results  Component Value Date   CHOL 122 03/06/2019   Lab Results  Component Value Date   HDL 35.90 (L) 03/06/2019   Lab Results  Component Value Date   LDLCALC 34 12/19/2016   Lab Results  Component Value Date   TRIG 203.0 (H) 03/06/2019   Lab Results  Component Value Date   CHOLHDL 3 03/06/2019   Lab Results  Component Value Date   HGBA1C 5.4 03/06/2019      Assessment & Plan:   Problem List Items Addressed This Visit      Cardiovascular and Mediastinum   Chronic atrial fibrillation ([place])     Digestive   Calculus of gallbladder without cholecystitis without obstruction   Relevant Orders   Ambulatory referral to  General Surgery     Nervous and Auditory   Excessive cerumen in right ear canal - Primary   Relevant Medications   carbamide peroxide (DEBROX) 6.5 % OTIC solution     Other   Epigastric pain   Relevant Medications   omeprazole (PRILOSEC) 40 MG capsule   Other Relevant Orders   Ambulatory referral to General Surgery   Amylase   Lipase       Meds ordered this encounter  Medications  . omeprazole (PRILOSEC) 40 MG capsule    Sig: Take 1 capsule (40 mg total) by mouth daily.    Dispense:  30 capsule    Refill:  3  . carbamide peroxide (DEBROX) 6.5 % OTIC solution    Sig: Place 5 drops into the right ear 2 (two) times daily.    Dispense:  15 mL    Refill:  0    Follow-up: Return in about 2 weeks (around 07/20/2019).   Have added Prilosec for patient's midepigastric pain.  Surgical referral for consultation of his cholelithiasis.  He will use Debrox.  Possible ear lavage in 2 weeks. [name], MD

## 2019-07-07 ENCOUNTER — Other Ambulatory Visit: Payer: Self-pay

## 2019-07-07 DIAGNOSIS — E039 Hypothyroidism, unspecified: Secondary | ICD-10-CM

## 2019-07-07 LAB — AMYLASE: Amylase: 68 U/L (ref 27–131)

## 2019-07-07 LAB — LIPASE: Lipase: 61 U/L — ABNORMAL HIGH (ref 11.0–59.0)

## 2019-07-07 MED ORDER — LEVOTHYROXINE SODIUM 25 MCG PO TABS: ORAL_TABLET | ORAL | 3 refills | Status: DC
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
## 2019-07-09 ENCOUNTER — Inpatient Hospital Stay: Payer: Medicare HMO

## 2019-07-09 ENCOUNTER — Inpatient Hospital Stay (HOSPITAL_BASED_OUTPATIENT_CLINIC_OR_DEPARTMENT_OTHER): Payer: Medicare HMO | Admitting: Physician Assistant

## 2019-07-09 ENCOUNTER — Other Ambulatory Visit: Payer: Self-pay

## 2019-07-09 ENCOUNTER — Encounter: Payer: Self-pay | Admitting: Physician Assistant

## 2019-07-09 ENCOUNTER — Inpatient Hospital Stay: Payer: Medicare HMO | Attending: Internal Medicine

## 2019-07-09 ENCOUNTER — Other Ambulatory Visit: Payer: Medicare HMO

## 2019-07-09 VITALS — BP 108/69 | HR 98 | Temp 98.5°F | Resp 18 | Ht 74.0 in | Wt 257.3 lb

## 2019-07-09 DIAGNOSIS — Z923 Personal history of irradiation: Secondary | ICD-10-CM | POA: Diagnosis not present

## 2019-07-09 DIAGNOSIS — R590 Localized enlarged lymph nodes: Secondary | ICD-10-CM | POA: Insufficient documentation

## 2019-07-09 DIAGNOSIS — Z902 Acquired absence of lung [part of]: Secondary | ICD-10-CM | POA: Insufficient documentation

## 2019-07-09 DIAGNOSIS — C7951 Secondary malignant neoplasm of bone: Secondary | ICD-10-CM | POA: Diagnosis not present

## 2019-07-09 DIAGNOSIS — C3432 Malignant neoplasm of lower lobe, left bronchus or lung: Secondary | ICD-10-CM | POA: Diagnosis not present

## 2019-07-09 DIAGNOSIS — C3492 Malignant neoplasm of unspecified part of left bronchus or lung: Secondary | ICD-10-CM

## 2019-07-09 DIAGNOSIS — Z9221 Personal history of antineoplastic chemotherapy: Secondary | ICD-10-CM | POA: Diagnosis not present

## 2019-07-09 DIAGNOSIS — Z5112 Encounter for antineoplastic immunotherapy: Secondary | ICD-10-CM | POA: Diagnosis not present

## 2019-07-09 DIAGNOSIS — Z79899 Other long term (current) drug therapy: Secondary | ICD-10-CM | POA: Diagnosis not present

## 2019-07-09 DIAGNOSIS — Z95828 Presence of other vascular implants and grafts: Secondary | ICD-10-CM

## 2019-07-09 LAB — CBC WITH DIFFERENTIAL (CANCER CENTER ONLY)
Abs Immature Granulocytes: 0.02 10*3/uL (ref 0.00–0.07)
Basophils Absolute: 0 10*3/uL (ref 0.0–0.1)
Basophils Relative: 0 %
Eosinophils Absolute: 0.1 10*3/uL (ref 0.0–0.5)
Eosinophils Relative: 2 %
HCT: 44.4 % (ref 39.0–52.0)
Hemoglobin: 15.7 g/dL (ref 13.0–17.0)
Immature Granulocytes: 0 %
Lymphocytes Relative: 20 %
Lymphs Abs: 1.3 10*3/uL (ref 0.7–4.0)
MCH: 34.3 pg — ABNORMAL HIGH (ref 26.0–34.0)
MCHC: 35.4 g/dL (ref 30.0–36.0)
MCV: 96.9 fL (ref 80.0–100.0)
Monocytes Absolute: 0.6 10*3/uL (ref 0.1–1.0)
Monocytes Relative: 10 %
Neutro Abs: 4.5 10*3/uL (ref 1.7–7.7)
Neutrophils Relative %: 68 %
Platelet Count: 144 10*3/uL — ABNORMAL LOW (ref 150–400)
RBC: 4.58 MIL/uL (ref 4.22–5.81)
RDW: 13.2 % (ref 11.5–15.5)
WBC Count: 6.6 10*3/uL (ref 4.0–10.5)
nRBC: 0 % (ref 0.0–0.2)

## 2019-07-09 LAB — CMP (CANCER CENTER ONLY)
ALT: 16 U/L (ref 0–44)
AST: 15 U/L (ref 15–41)
Albumin: 3.7 g/dL (ref 3.5–5.0)
Alkaline Phosphatase: 88 U/L (ref 38–126)
Anion gap: 8 (ref 5–15)
BUN: 14 mg/dL (ref 8–23)
CO2: 24 mmol/L (ref 22–32)
Calcium: 8.7 mg/dL — ABNORMAL LOW (ref 8.9–10.3)
Chloride: 107 mmol/L (ref 98–111)
Creatinine: 0.95 mg/dL (ref 0.61–1.24)
GFR, Est AFR Am: >60 mL/min (ref >60)
GFR, Estimated: >60 mL/min (ref >60)
Glucose, Bld: 116 mg/dL — ABNORMAL HIGH (ref 70–99)
Potassium: 4.3 mmol/L (ref 3.5–5.1)
Sodium: 139 mmol/L (ref 135–145)
Total Bilirubin: 1.2 mg/dL (ref 0.3–1.2)
Total Protein: 6 g/dL — ABNORMAL LOW (ref 6.5–8.1)

## 2019-07-09 LAB — TSH: TSH: 0.97 u[IU]/mL (ref 0.320–4.118)

## 2019-07-09 MED ORDER — SODIUM CHLORIDE 0.9 % IV SOLN
Freq: Once | INTRAVENOUS | Status: AC
  Filled 2019-07-09: qty 250

## 2019-07-09 MED ORDER — SODIUM CHLORIDE 0.9% FLUSH
10.0000 mL | INTRAVENOUS | Status: DC | PRN
  Administered 2019-07-09: 10 mL
  Filled 2019-07-09: qty 10

## 2019-07-09 MED ORDER — SODIUM CHLORIDE 0.9 % IV SOLN
200.0000 mg | Freq: Once | INTRAVENOUS | Status: AC
  Administered 2019-07-09: 11:00:00 200 mg via INTRAVENOUS
  Filled 2019-07-09: qty 8

## 2019-07-09 MED ORDER — HEPARIN SOD (PORK) LOCK FLUSH 100 UNIT/ML IV SOLN
500.0000 [IU] | Freq: Once | INTRAVENOUS | Status: AC | PRN
  Administered 2019-07-09: 12:00:00 500 [IU]
  Filled 2019-07-09: qty 5

## 2019-07-09 NOTE — Patient Instructions (Signed)

## 2019-07-09 NOTE — Patient Instructions (Signed)
[facility] Discharge Instructions for Patients Receiving Chemotherapy  Today you received the following chemotherapy agents:  Keytruda.  To help prevent nausea and vomiting after your treatment, we encourage you to take your nausea medication as directed.   If you develop nausea and vomiting that is not controlled by your nausea medication, call the clinic.   BELOW ARE SYMPTOMS THAT SHOULD BE REPORTED IMMEDIATELY:  *FEVER GREATER THAN 100.5 F  *CHILLS WITH OR WITHOUT FEVER  NAUSEA AND VOMITING THAT IS NOT CONTROLLED WITH YOUR NAUSEA MEDICATION  *UNUSUAL SHORTNESS OF BREATH  *UNUSUAL BRUISING OR BLEEDING  TENDERNESS IN MOUTH AND THROAT WITH OR WITHOUT PRESENCE OF ULCERS  *URINARY PROBLEMS  *BOWEL PROBLEMS  UNUSUAL RASH Items with * indicate a potential emergency and should be followed up as soon as possible.  Feel free to call the clinic should you have any questions or concerns. The clinic phone number is [phone].  Please show the CHEMO ALERT CARD at check-in to the Emergency Department and triage nurse.    

## 2019-07-10 ENCOUNTER — Telehealth: Payer: Self-pay | Admitting: Physician Assistant

## 2019-07-10 NOTE — Telephone Encounter (Signed)
Scheduled per los. Called and left msg. Mailed printout  °

## 2019-07-17 ENCOUNTER — Encounter: Payer: Self-pay | Admitting: Family Medicine

## 2019-07-17 DIAGNOSIS — R1013 Epigastric pain: Secondary | ICD-10-CM

## 2019-07-20 ENCOUNTER — Encounter: Payer: Self-pay | Admitting: Family Medicine

## 2019-07-20 ENCOUNTER — Ambulatory Visit (INDEPENDENT_AMBULATORY_CARE_PROVIDER_SITE_OTHER): Payer: Medicare HMO | Admitting: Family Medicine

## 2019-07-20 ENCOUNTER — Other Ambulatory Visit: Payer: Self-pay

## 2019-07-20 VITALS — BP 110/70 | HR 120 | Temp 97.7°F | Wt 253.4 lb

## 2019-07-20 DIAGNOSIS — R1013 Epigastric pain: Secondary | ICD-10-CM

## 2019-07-20 DIAGNOSIS — H6121 Impacted cerumen, right ear: Secondary | ICD-10-CM | POA: Diagnosis not present

## 2019-07-20 DIAGNOSIS — K8020 Calculus of gallbladder without cholecystitis without obstruction: Secondary | ICD-10-CM | POA: Diagnosis not present

## 2019-07-20 MED ORDER — OMEPRAZOLE 40 MG PO CPDR: 40.0000 mg | DELAYED_RELEASE_CAPSULE | Freq: Every day | ORAL | 1 refills | Status: DC

## 2019-07-20 NOTE — Progress Notes (Signed)
Established Patient Office Visit  Subjective:  Patient ID: Victor Huber, male    DOB: 07-13-1950  Age: 69 y.o. MRN: [id number]  CC:  Chief Complaint  Patient presents with  . Follow-up    2 week follow up for rt ear and stomach    HPI Victor Huber presents for follow-up of his ceruminosis, midepigastric pain.  Patient use the Debrox and had significant swelling in his ear canal.  He then just used the bulb and was able to remove a great deal of wax from both ears.  Since starting the Prilosec the midepigastric pain is all but resolved.  He does believe that he may feel some bloating.  Denies constipation.  Fortunately has not needed the oxycodone for his back.  Past Medical History:  Diagnosis Date  . Adenocarcinoma of left lung, stage 3 ([place]) 08/15/2016  . Arthritis   . Atrial fibrillation ([place]) 08/15/2016  . Atrial fibrillation ([place])   . Cancer, metastatic to bone ([facility])   . COPD (chronic obstructive pulmonary disease) ([place]) 08/15/2016  . History of chemotherapy   . History of radiation therapy   . Hyperlipidemia   . Hypothyroid 08/15/2016  . Longstanding persistent atrial fibrillation ([place]) 08/29/2016  . Pathologic fracture    left femur  . Pneumonitis   . S/P TURP 08/15/2016  . Wears glasses   . Wears hearing aid in both ears     Past Surgical History:  Procedure Laterality Date  . BRONCHOSCOPY  10/2014  . CARDIAC CATHETERIZATION     05/07/12  . CARDIOVERSION     x2  . COLONOSCOPY    . DG BIOPSY LUNG Left 10/2014   FNA - Adenocarcinoma   . FEMUR IM NAIL Left 02/19/2017  . FEMUR IM NAIL Left 02/19/2017   Procedure: INTRAMEDULLARY (IM) NAIL FEMORAL;  Surgeon: Marchia Bond, MD;  Location: [facility];  Service: Orthopedics;  Laterality: Left;  . IR FLUORO GUIDE PORT INSERTION RIGHT  07/03/2017  . IR US GUIDE VASC ACCESS RIGHT  07/03/2017  . LUNG CANCER SURGERY Left 12/2014   Wedge Resection   . MULTIPLE TOOTH EXTRACTIONS    . Status post TURP    . TONSILLECTOMY       Family History  Problem Relation Age of Onset  . Breast cancer Mother   . Breast cancer Maternal Aunt   . Breast cancer Maternal Aunt   . Lung disease Neg Hx     Social History   Socioeconomic History  . Marital status: Married    Spouse name: Not on file  . Number of children: Not on file  . Years of education: Not on file  . Highest education level: Not on file  Occupational History  . Not on file  Tobacco Use  . Smoking status: Former Smoker    Packs/day: 1.00    Years: 50.00    Pack years: 50.00    Types: Cigarettes    Quit date: 08/15/2012    Years since quitting: 6.9  . Smokeless tobacco: Never Used  Substance and Sexual Activity  . Alcohol use: No  . Drug use: No  . Sexual activity: Not on file  Other Topics Concern  . Not on file  Social History Narrative   [facility] (09/26/16):   Originally from [place]. Moved to [facility] February 2018. Always lived in [place]. Moved to be closer to children & grandchildren. No international travel. 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 No mold exposure. No bird exposure or hot tub exposure. Enjoys reading. Previously enjoyed wood working with domestic woods.    Social Determinants of Health   Financial Resource Strain: Low Risk   . Difficulty of Paying Living Expenses: Not hard at all  Food Insecurity: No Food Insecurity  . Worried About Charity fundraiser in the Last Year: Never true  . Ran Out of Food in the Last Year: Never true  Transportation Needs: No Transportation Needs  . Lack of Transportation (Medical): No  . Lack of Transportation (Non-Medical): No  Physical Activity:   . Days of Exercise per Week:   . Minutes of Exercise per Session:   Stress:   . Feeling of Stress :   Social Connections:   . Frequency of Communication with Friends and Family:   . Frequency of Social Gatherings with Friends and Family:   . Attends Religious  Services:   . Active Member of Clubs or Organizations:   . Attends Archivist Meetings:   Marland Kitchen Marital Status:   Intimate Partner Violence:   . Fear of Current or Ex-Partner:   . Emotionally Abused:   Marland Kitchen Physically Abused:   . Sexually Abused:     Outpatient Medications Prior to Visit  Medication Sig Dispense Refill  . albuterol (VENTOLIN HFA) 108 (90 Base) MCG/ACT inhaler Inhale 1-2 puffs into the lungs every 4 (four) hours as needed for wheezing or shortness of breath. 3 month supply 24 g 3  . apixaban (ELIQUIS) 5 MG TABS tablet Take 1 tablet (5 mg total) by mouth 2 (two) times daily. 180 tablet 1  . atenolol (TENORMIN) 50 MG tablet TAKE 1 TABLET (50 MG TOTAL) BY MOUTH DAILY. 90 tablet 3  . atorvastatin (LIPITOR) 40 MG tablet Take 1 tablet (40 mg total) by mouth daily. 90 tablet 2  . citalopram (CELEXA) 40 MG tablet Take 1 tablet (40 mg total) by mouth daily. 90 tablet 1  . ibuprofen (ADVIL,MOTRIN) 400 MG tablet Take 400 mg by mouth every 4 (four) hours as needed for moderate pain.    Marland Kitchen levothyroxine (SYNTHROID) 150 MCG tablet Take 150 mcg by mouth daily before breakfast.    . levothyroxine (SYNTHROID) 25 MCG tablet TAKE 1 TABLET EVERY DAY BEFORE BREAKFAST 90 tablet 3  . omeprazole (PRILOSEC) 40 MG capsule Take 1 capsule (40 mg total) by mouth daily. 90 capsule 1  . Oxycodone HCl 10 MG TABS Take one daily as needed for severe back pain. 30 tablet 0  . tamsulosin (FLOMAX) 0.4 MG CAPS capsule Take 1 capsule (0.4 mg total) by mouth daily. 90 capsule 4  . carbamide peroxide (DEBROX) 6.5 % OTIC solution Place 5 drops into the right ear 2 (two) times daily. 15 mL 0   No facility-administered medications prior to visit.    Allergies  Allergen Reactions  . Carboplatin Itching, Nausea And Vomiting and Other (See Comments)    Flushing  . Flecainide Hypertension    CAUSED HEART ISSUES   . Debrox [Carbamide Peroxide]     Swelling in ear canal.     ROS Review of Systems   Constitutional: Negative.   HENT: Negative.   Respiratory: Negative.   Cardiovascular: Negative.   Gastrointestinal: Negative for abdominal distention, abdominal pain, nausea and vomiting.  Neurological: Negative.  Negative for speech difficulty and light-headedness.  Psychiatric/Behavioral: Negative.       Objective:    Physical Exam  Constitutional: He is oriented to person,  place, and time. He appears well-developed and well-nourished. No distress.  HENT:  Head: Normocephalic and atraumatic.  Right Ear: External ear normal.  Left Ear: External ear normal.  Eyes: Conjunctivae are normal. Right eye exhibits no discharge. Left eye exhibits no discharge. No scleral icterus.  Neck: No JVD present. No tracheal deviation present.  Cardiovascular: An irregularly irregular rhythm present.  Pulmonary/Chest: Effort normal and breath sounds normal. No stridor.  Abdominal: Bowel sounds are normal.  Neurological: He is alert and oriented to person, place, and time.  Skin: Skin is warm and dry. He is not diaphoretic.  Psychiatric: He has a normal mood and affect. His behavior is normal.    BP 110/70 (BP Location: Left Arm, Patient Position: Sitting, Cuff Size: Normal)   Pulse (!) 120   Temp 97.7 F (36.5 C) (Temporal)   Wt 253 lb 6.4 oz (114.9 kg)   SpO2 92%   BMI 32.53 kg/m  Wt Readings from Last 3 Encounters:  07/20/19 253 lb 6.4 oz (114.9 kg)  07/09/19 257 lb 4.8 oz (116.7 kg)  07/06/19 257 lb 12.8 oz (116.9 kg)     Health Maintenance Due  Topic Date Due  . Hepatitis C Screening  Never done  . COLONOSCOPY  Never done    There are no preventive care reminders to display for this patient.  Lab Results  Component Value Date   TSH 0.970 07/09/2019   Lab Results  Component Value Date   WBC 6.6 07/09/2019   HGB 15.7 07/09/2019   HCT 44.4 07/09/2019   MCV 96.9 07/09/2019   PLT 144 (L) 07/09/2019   Lab Results  Component Value Date   NA 139 07/09/2019   K 4.3  07/09/2019   CHLORIDE 102 04/04/2017   CO2 24 07/09/2019   GLUCOSE 116 (H) 07/09/2019   BUN 14 07/09/2019   CREATININE 0.95 07/09/2019   BILITOT 1.2 07/09/2019   ALKPHOS 88 07/09/2019   AST 15 07/09/2019   ALT 16 07/09/2019   PROT 6.0 (L) 07/09/2019   ALBUMIN 3.7 07/09/2019   CALCIUM 8.7 (L) 07/09/2019   ANIONGAP 8 07/09/2019   EGFR >60 04/04/2017   GFR 67.51 03/06/2019   Lab Results  Component Value Date   CHOL 122 03/06/2019   Lab Results  Component Value Date   HDL 35.90 (L) 03/06/2019   Lab Results  Component Value Date   LDLCALC 34 12/19/2016   Lab Results  Component Value Date   TRIG 203.0 (H) 03/06/2019   Lab Results  Component Value Date   CHOLHDL 3 03/06/2019   Lab Results  Component Value Date   HGBA1C 5.4 03/06/2019      Assessment & Plan:   Problem List Items Addressed This Visit      Digestive   Calculus of gallbladder without cholecystitis without obstruction     Nervous and Auditory   Excessive cerumen in right ear canal     Other   Epigastric pain - Primary      No orders of the defined types were placed in this encounter.   Follow-up: Return if symptoms worsen or fail to improve, for continue priolosec. .  Will go ahead and discontinue the famotidine and use Prilosec.  [name], MD

## 2019-07-24 ENCOUNTER — Telehealth: Payer: Self-pay | Admitting: Family Medicine

## 2019-07-24 NOTE — Progress Notes (Signed)
Pharmacist Chemotherapy Monitoring - Follow Up Assessment    I verify that I have reviewed each item in the below checklist:  . Regimen for the patient is scheduled for the appropriate day and plan matches scheduled date. Marland Kitchen Appropriate non-routine labs are ordered dependent on drug ordered. . If applicable, additional medications reviewed and ordered per protocol based on lifetime cumulative doses and/or treatment regimen.   Plan for follow-up and/or issues identified: No . I-vent associated with next due treatment: No . MD and/or nursing notified: No  Acquanetta Belling 07/24/2019 5:03 PM

## 2019-07-24 NOTE — Progress Notes (Signed)
  Chronic Care Management   Outreach Note  07/24/2019 Name: TYKEL BADIE MRN: [id number] DOB: 1951-03-15  Referred by: [name], MD Reason for referral : No chief complaint on file.   A second unsuccessful telephone outreach was attempted today. The patient was referred to pharmacist for assistance with care management and care coordination.  This note is not being shared with the patient for the following reason: To respect privacy (The patient or proxy has requested that the information not be shared).  Follow Up Plan:   Earney Hamburg Up[facility] Scheduler

## 2019-07-28 NOTE — Progress Notes (Signed)
Victor Huber OFFICE PROGRESS NOTE  Victor Maw, MD [place] [address]  DIAGNOSIS: Metastatic non-small cell lung cancer initially diagnosed as stage IIIA (T2a, N2, M0) non-small cell lung cancer, poorly differentiated adenocarcinoma presented with left lower lobe lung mass in addition to mediastinal lymphadenopathy. The patient was diagnosed with metastatic disease involving the left femur as well as left supraclavicular nodal metastases and right paratracheal lymphadenopathy in October 2018.  Biomarker Findings Microsatellite Status - MS-Stable Tumor Mutational Burden - TMB-Low (3 Muts/Mb) Genomic Findings For a complete list of the genes assayed, please refer to the Appendix. STK11 P261f*6 CDJME2ApS34HDQsplice site 1222+9N>LDAXX E374* MLL2 [id number] NBN K23347f NOTCH2 [id number] splice site 988417+4Y>CDisease relevant genes with no reportable alterations: EGFR, KRAS, ALK, BRAF, MET, RET, ERBB2, ROS1   PDL1 expression5%  PRIOR THERAPY: 1) status post wedge resection of the left lower lobe lung mass as well as AP window lymph node dissection but there was residual metastatic mediastinal lymphadenopathy that could not be resected. 2) a course of concurrent chemoradiation [place] carb[place]  3) status post palliative radiotherapy to the left femur metastatic bone disease. 4) Systemic chemotherapy with carboplatin for AUC of 5, Alimta 500 mg/M2 and Keytruda 200 mg IV every 3 weeks. First dose March 07, 2017. Carboplatin was discontinued during cycle #2 secondary to hypersensitivity reaction. 5) status post 2 cycles of maintenance treatment with Alimta and Ketruda (pembrolizumab). Alimta was discontinued secondary to intolerance  CURRENT THERAPY: Maintenance treatment with single agent Keytruda (pembrolizumab) status post37 cycles.   INTERVAL HISTORY: Victor Huber 17o. male returns to the clinic for a follow up visit. The patient is feeling well today without any concerning complaints except for he occasionally has a hard time swallowing pills and food. It has progressively been occurring more recently over the course of the last two weeks. Last night he described a piece of meat being stuck in his throat. It improved with drinking water. He describes a ticking sensation. He denies any history of stricture. He did receive radiotherapy in the past. He takes prilosec for reflux. He has not seen GI.   Otherwise, he is tolerating his treatment with Keytruda well without any adverse side effects.  He denies any recent fever, chills, night sweats, or weight loss.  He reports his baseline dyspnea on exertion and dry cough but denies any chest pain or hemoptysis.  He has COPD. He is followed by pulmonology but admits he likely should follow up with them in the near future. His dyspnea on exertion limits his activities. He denies any nausea, vomiting, diarrhea, or constipation.  He denies any headache or visual changes.  He denies any rashes or skin changes.  He is here today for evaluation before starting cycle #38   MEDICAL HISTORY: Past Medical History:  Diagnosis Date  . Adenocarcinoma of left lung, stage 3 ([facility]/16/2018  . Arthritis   . Atrial fibrillation ([facility] [place]/16/2018  . Atrial fibrillation ([facility] . Cancer, metastatic to bone ([facility] . COPD (chronic obstructive pulmonary disease) ([place]/16/2018  . History of chemotherapy   . History of radiation therapy   . Hyperlipidemia   . Hypothyroid 08/15/2016  . Longstanding persistent atrial fibrillation ([facility]/30/2018  . Pathologic fracture    left femur  . Pneumonitis   . S/P TURP 08/15/2016  . Wears glasses   . Wears hearing aid in  both ears     ALLERGIES:  is allergic to carboplatin; flecainide; and debrox [carbamide peroxide].  MEDICATIONS:  Current Outpatient Medications  Medication  Sig Dispense Refill  . albuterol (VENTOLIN HFA) 108 (90 Base) MCG/ACT inhaler Inhale 1-2 puffs into the lungs every 4 (four) hours as needed for wheezing or shortness of breath. 3 month supply 24 g 3  . apixaban (ELIQUIS) 5 MG TABS tablet Take 1 tablet (5 mg total) by mouth 2 (two) times daily. 180 tablet 1  . atenolol (TENORMIN) 50 MG tablet TAKE 1 TABLET (50 MG TOTAL) BY MOUTH DAILY. 90 tablet 3  . atorvastatin (LIPITOR) 40 MG tablet Take 1 tablet (40 mg total) by mouth daily. 90 tablet 2  . citalopram (CELEXA) 40 MG tablet Take 1 tablet (40 mg total) by mouth daily. 90 tablet 1  . ibuprofen (ADVIL,MOTRIN) 400 MG tablet Take 400 mg by mouth every 4 (four) hours as needed for moderate pain.    Marland Kitchen levothyroxine (SYNTHROID) 150 MCG tablet Take 150 mcg by mouth daily before breakfast.    . levothyroxine (SYNTHROID) 25 MCG tablet TAKE 1 TABLET EVERY DAY BEFORE BREAKFAST 90 tablet 3  . omeprazole (PRILOSEC) 40 MG capsule Take 1 capsule (40 mg total) by mouth daily. 90 capsule 1  . Oxycodone HCl 10 MG TABS Take one daily as needed for severe back pain. 30 tablet 0  . tamsulosin (FLOMAX) 0.4 MG CAPS capsule Take 1 capsule (0.4 mg total) by mouth daily. 90 capsule 4   No current facility-administered medications for this visit.   Facility-Administered Medications Ordered in Other Visits  Medication Dose Route Frequency Provider Last Rate Last Admin  . heparin lock flush 100 unit/mL  500 Units Intracatheter Once PRN Victor Bears, MD      . pembrolizumab [facility]) 200 mg in sodium chloride 0.9 % 50 mL chemo infusion  200 mg Intravenous Once Victor Bears, MD      . sodium chloride flush (NS) 0.9 % injection 10 mL  10 mL Intracatheter PRN Victor Bears, MD        SURGICAL HISTORY:  Past Surgical History:  Procedure Laterality Date  . BRONCHOSCOPY  10/2014  . CARDIAC CATHETERIZATION     05/07/12  . CARDIOVERSION     x2  . COLONOSCOPY    . DG BIOPSY LUNG Left 10/2014   FNA -  Adenocarcinoma   . FEMUR IM NAIL Left 02/19/2017  . FEMUR IM NAIL Left 02/19/2017   Procedure: INTRAMEDULLARY (IM) NAIL FEMORAL;  Surgeon: Victor Bond, MD;  Location: [facility];  Service: Orthopedics;  Laterality: Left;  . IR FLUORO GUIDE PORT INSERTION RIGHT  07/03/2017  . IR US GUIDE VASC ACCESS RIGHT  07/03/2017  . LUNG CANCER SURGERY Left 12/2014   Wedge Resection   . MULTIPLE TOOTH EXTRACTIONS    . Status post TURP    . TONSILLECTOMY      REVIEW OF SYSTEMS:   Review of Systems  Constitutional: Negative for appetite change, chills, fatigue, fever and unexpected weight change.  HENT: Positive for dysphagia. Negative for mouth sores, nosebleeds, sore throat and trouble swallowing.   Eyes: Negative for eye problems and icterus.  Respiratory: Positive for dyspnea on exertion and chronic cough. Negative for hemoptysis and wheezing.  Cardiovascular: Negative for chest pain and leg swelling.  Gastrointestinal: Negative for abdominal pain (epigastric pain improved), constipation, diarrhea, nausea and vomiting.  Genitourinary: Negative for bladder incontinence, difficulty urinating, dysuria, frequency and hematuria.   Musculoskeletal: Negative for back  pain, gait problem, neck pain and neck stiffness.  Skin: Negative for itching and rash.  Neurological: Negative for dizziness, extremity weakness, gait problem, headaches, light-headedness and seizures.  Hematological: Negative for adenopathy. Does not bruise/bleed easily.  Psychiatric/Behavioral: Negative for confusion, depression and sleep disturbance. The patient is not nervous/anxious.     PHYSICAL EXAMINATION:  Blood pressure 114/85, pulse 83, temperature 98 F (36.7 C), temperature source Oral, resp. rate 18, height _0  (1.88 m), weight 253 lb 6.4 oz (114.9 kg), SpO2 95 %.  ECOG PERFORMANCE STATUS: 1 - Symptomatic but completely ambulatory  Physical Exam  Constitutional: Oriented to person, place, and time and well-developed,  well-nourished, and in no distress.  HENT:  Head: Normocephalic and atraumatic.  Mouth/Throat: Oropharynx is clear and moist. No oropharyngeal exudate.  Eyes: Conjunctivae are normal. Right eye exhibits no discharge. Left eye exhibits no discharge. No scleral icterus.  Neck: Normal range of motion. Neck supple.  Cardiovascular: Normal rate, irregular rhythm, normal heart sounds and intact distal pulses.   Pulmonary/Chest: Effort normal and breath sounds normal. No respiratory distress. No wheezes. No rales.  Abdominal: Soft. Bowel sounds are normal. Exhibits no distension and no mass. There is no tenderness.  Musculoskeletal: Normal range of motion. Exhibits no edema.  Lymphadenopathy:    No cervical adenopathy.  Neurological: Alert and oriented to person, place, and time. Exhibits normal muscle tone. Gait normal. Coordination normal.  Skin: Skin is warm and dry. No rash noted. Not diaphoretic. No erythema. No pallor.  Psychiatric: Mood, memory and judgment normal.  Vitals reviewed.  LABORATORY DATA: Lab Results  Component Value Date   WBC 8.5 07/30/2019   HGB 15.5 07/30/2019   HCT 44.3 07/30/2019   MCV 95.1 07/30/2019   PLT 156 07/30/2019      Chemistry      Component Value Date/Time   NA 141 07/30/2019 1111   NA 136 04/04/2017 1141   K 4.3 07/30/2019 1111   K 4.4 04/04/2017 1141   CL 106 07/30/2019 1111   CO2 25 07/30/2019 1111   CO2 24 04/04/2017 1141   BUN 17 07/30/2019 1111   BUN 21.1 04/04/2017 1141   CREATININE 0.92 07/30/2019 1111   CREATININE 1.1 04/04/2017 1141      Component Value Date/Time   CALCIUM 9.1 07/30/2019 1111   CALCIUM 9.1 04/04/2017 1141   ALKPHOS 103 07/30/2019 1111   ALKPHOS 89 04/04/2017 1141   AST 17 07/30/2019 1111   AST 17 04/04/2017 1141   ALT 18 07/30/2019 1111   ALT 18 04/04/2017 1141   BILITOT 1.0 07/30/2019 1111   BILITOT 0.67 04/04/2017 1141       RADIOGRAPHIC STUDIES:  No results found.   ASSESSMENT/PLAN:  This is a  very pleasant 69 year old Caucasian male with metastatic non-small cell lung cancer, adenocarcinoma. He was initially diagnosed as astage IIIa. He presented with a left lower lobe lung mass in addition to mediastinal lymphadenopathy. He was diagnosed in January 2016. He is status post left lower lobectomy with lymph node dissection followed by concurrent chemoradiation. His PDL 1 expression is 5%.  He was diagnosed with metastatic disease involving the leftfemuras well as a left supraclavicular nodalmetastasis and right paratracheal lymphadenopathy in October 2018.  Hewasstarted on systemic chemotherapy with carboplatin, Alimta, and Keytruda. Carboplatin was discontinued after cycle #2 due to a hypersensitivity reaction. Alimta was also discontinued after cycle #4 due to intolerance. The patient has been on maintenance treatment with single agent Keytruda for37cycles.  Labs were reviewed.  We recommend that he proceed with cycle #38today as scheduled.   We will see him back for a follow up visit in 3 weeks for evaluation before starting cycle #39.  Regarding his swallowing concerns, I offered to refer the patient to GI for evaluation of his symptoms. The patient is not interested in seeing GI at this time but knows that we would be happy to refer him if he changes his mind. In the meantime, he was encouraged to drink a glass of water with his pills and to drink fluids while eating and to eat slower and smaller bites. Also encouraged to continue prilosec.   The patient was advised to call immediately if he has any concerning symptoms in the interval. The patient voices understanding of current disease status and treatment options and is in agreement with the current care plan. All questions were answered. The patient knows to call the clinic with any problems, questions or concerns. We can certainly see the patient much sooner if necessary   No orders of the defined types were  placed in this encounter.    Sabrinia Prien L Barbera Perritt, PA-C 07/30/19

## 2019-07-30 ENCOUNTER — Inpatient Hospital Stay: Payer: Medicare HMO

## 2019-07-30 ENCOUNTER — Inpatient Hospital Stay (HOSPITAL_BASED_OUTPATIENT_CLINIC_OR_DEPARTMENT_OTHER): Payer: Medicare HMO | Admitting: Physician Assistant

## 2019-07-30 ENCOUNTER — Other Ambulatory Visit: Payer: Self-pay

## 2019-07-30 VITALS — BP 114/85 | HR 83 | Temp 98.0°F | Resp 18 | Ht 74.0 in | Wt 253.4 lb

## 2019-07-30 DIAGNOSIS — C3492 Malignant neoplasm of unspecified part of left bronchus or lung: Secondary | ICD-10-CM

## 2019-07-30 DIAGNOSIS — R590 Localized enlarged lymph nodes: Secondary | ICD-10-CM | POA: Diagnosis not present

## 2019-07-30 DIAGNOSIS — Z79899 Other long term (current) drug therapy: Secondary | ICD-10-CM | POA: Diagnosis not present

## 2019-07-30 DIAGNOSIS — Z5112 Encounter for antineoplastic immunotherapy: Secondary | ICD-10-CM

## 2019-07-30 DIAGNOSIS — Z9221 Personal history of antineoplastic chemotherapy: Secondary | ICD-10-CM | POA: Diagnosis not present

## 2019-07-30 DIAGNOSIS — Z923 Personal history of irradiation: Secondary | ICD-10-CM | POA: Diagnosis not present

## 2019-07-30 DIAGNOSIS — C7951 Secondary malignant neoplasm of bone: Secondary | ICD-10-CM

## 2019-07-30 DIAGNOSIS — Z902 Acquired absence of lung [part of]: Secondary | ICD-10-CM | POA: Diagnosis not present

## 2019-07-30 DIAGNOSIS — C3432 Malignant neoplasm of lower lobe, left bronchus or lung: Secondary | ICD-10-CM | POA: Diagnosis not present

## 2019-07-30 LAB — CMP (CANCER CENTER ONLY)
ALT: 18 U/L (ref 0–44)
AST: 17 U/L (ref 15–41)
Albumin: 3.7 g/dL (ref 3.5–5.0)
Alkaline Phosphatase: 103 U/L (ref 38–126)
Anion gap: 10 (ref 5–15)
BUN: 17 mg/dL (ref 8–23)
CO2: 25 mmol/L (ref 22–32)
Calcium: 9.1 mg/dL (ref 8.9–10.3)
Chloride: 106 mmol/L (ref 98–111)
Creatinine: 0.92 mg/dL (ref 0.61–1.24)
GFR, Est AFR Am: >60 mL/min (ref >60)
GFR, Estimated: >60 mL/min (ref >60)
Glucose, Bld: 126 mg/dL — ABNORMAL HIGH (ref 70–99)
Potassium: 4.3 mmol/L (ref 3.5–5.1)
Sodium: 141 mmol/L (ref 135–145)
Total Bilirubin: 1 mg/dL (ref 0.3–1.2)
Total Protein: 6.7 g/dL (ref 6.5–8.1)

## 2019-07-30 LAB — CBC WITH DIFFERENTIAL (CANCER CENTER ONLY)
Abs Immature Granulocytes: 0.03 10*3/uL (ref 0.00–0.07)
Basophils Absolute: 0 10*3/uL (ref 0.0–0.1)
Basophils Relative: 0 %
Eosinophils Absolute: 0.1 10*3/uL (ref 0.0–0.5)
Eosinophils Relative: 1 %
HCT: 44.3 % (ref 39.0–52.0)
Hemoglobin: 15.5 g/dL (ref 13.0–17.0)
Immature Granulocytes: 0 %
Lymphocytes Relative: 17 %
Lymphs Abs: 1.5 10*3/uL (ref 0.7–4.0)
MCH: 33.3 pg (ref 26.0–34.0)
MCHC: 35 g/dL (ref 30.0–36.0)
MCV: 95.1 fL (ref 80.0–100.0)
Monocytes Absolute: 0.7 10*3/uL (ref 0.1–1.0)
Monocytes Relative: 9 %
Neutro Abs: 6.2 10*3/uL (ref 1.7–7.7)
Neutrophils Relative %: 73 %
Platelet Count: 156 10*3/uL (ref 150–400)
RBC: 4.66 MIL/uL (ref 4.22–5.81)
RDW: 12.8 % (ref 11.5–15.5)
WBC Count: 8.5 10*3/uL (ref 4.0–10.5)
nRBC: 0 % (ref 0.0–0.2)

## 2019-07-30 LAB — TSH: TSH: 0.103 u[IU]/mL — ABNORMAL LOW (ref 0.320–4.118)

## 2019-07-30 MED ORDER — HEPARIN SOD (PORK) LOCK FLUSH 100 UNIT/ML IV SOLN
500.0000 [IU] | Freq: Once | INTRAVENOUS | Status: AC | PRN
  Administered 2019-07-30: 13:00:00 500 [IU]
  Filled 2019-07-30: qty 5

## 2019-07-30 MED ORDER — SODIUM CHLORIDE 0.9 % IV SOLN
Freq: Once | INTRAVENOUS | Status: AC
  Filled 2019-07-30: qty 250

## 2019-07-30 MED ORDER — SODIUM CHLORIDE 0.9% FLUSH
10.0000 mL | INTRAVENOUS | Status: DC | PRN
  Administered 2019-07-30: 10 mL
  Filled 2019-07-30: qty 10

## 2019-07-30 MED ORDER — SODIUM CHLORIDE 0.9 % IV SOLN
200.0000 mg | Freq: Once | INTRAVENOUS | Status: AC
  Administered 2019-07-30: 200 mg via INTRAVENOUS
  Filled 2019-07-30: qty 8

## 2019-07-30 NOTE — Patient Instructions (Signed)
[facility] Discharge Instructions for Patients Receiving Chemotherapy  Today you received the following chemotherapy agents: pembrolizumab.  To help prevent nausea and vomiting after your treatment, we encourage you to take your nausea medication as directed.   If you develop nausea and vomiting that is not controlled by your nausea medication, call the clinic.   BELOW ARE SYMPTOMS THAT SHOULD BE REPORTED IMMEDIATELY:  *FEVER GREATER THAN 100.5 F  *CHILLS WITH OR WITHOUT FEVER  NAUSEA AND VOMITING THAT IS NOT CONTROLLED WITH YOUR NAUSEA MEDICATION  *UNUSUAL SHORTNESS OF BREATH  *UNUSUAL BRUISING OR BLEEDING  TENDERNESS IN MOUTH AND THROAT WITH OR WITHOUT PRESENCE OF ULCERS  *URINARY PROBLEMS  *BOWEL PROBLEMS  UNUSUAL RASH Items with * indicate a potential emergency and should be followed up as soon as possible.  Feel free to call the clinic should you have any questions or concerns. The clinic phone number is [phone].  Please show the CHEMO ALERT CARD at check-in to the Emergency Department and triage nurse.   

## 2019-08-01 ENCOUNTER — Encounter: Payer: Self-pay | Admitting: Family Medicine

## 2019-08-03 NOTE — Telephone Encounter (Signed)
Lets go ahead and discontinue the 14mcg  levothyroxine tab and just take the 157mcg tab.  TSH is be checked far too often. Doesn't need to be checked for another 6-8 weeks.

## 2019-08-13 ENCOUNTER — Encounter: Payer: Self-pay | Admitting: Family Medicine

## 2019-08-14 NOTE — Telephone Encounter (Signed)
Make an appointment to see me. Please ask him to start using Miralax and consider going to ER if worse.

## 2019-08-14 NOTE — Progress Notes (Signed)
Pharmacist Chemotherapy Monitoring - Follow Up Assessment    I verify that I have reviewed each item in the below checklist:  . Regimen for the patient is scheduled for the appropriate day and plan matches scheduled date. Marland Kitchen Appropriate non-routine labs are ordered dependent on drug ordered. . If applicable, additional medications reviewed and ordered per protocol based on lifetime cumulative doses and/or treatment regimen.   Plan for follow-up and/or issues identified: No . I-vent associated with next due treatment: No . MD and/or nursing notified: No  Victor Huber [facility] 08/14/2019 9:13 AM

## 2019-08-17 ENCOUNTER — Other Ambulatory Visit: Payer: Self-pay

## 2019-08-17 ENCOUNTER — Encounter: Payer: Self-pay | Admitting: Family Medicine

## 2019-08-17 ENCOUNTER — Ambulatory Visit (INDEPENDENT_AMBULATORY_CARE_PROVIDER_SITE_OTHER): Payer: Medicare HMO | Admitting: Family Medicine

## 2019-08-17 VITALS — BP 110/70 | HR 99 | Temp 97.8°F | Ht 74.0 in | Wt 244.8 lb

## 2019-08-17 DIAGNOSIS — R1084 Generalized abdominal pain: Secondary | ICD-10-CM | POA: Diagnosis not present

## 2019-08-17 DIAGNOSIS — K8020 Calculus of gallbladder without cholecystitis without obstruction: Secondary | ICD-10-CM

## 2019-08-17 DIAGNOSIS — K5901 Slow transit constipation: Secondary | ICD-10-CM | POA: Diagnosis not present

## 2019-08-17 LAB — LIPASE: Lipase: 1138 U/L — ABNORMAL HIGH (ref 11.0–59.0)

## 2019-08-17 LAB — AMYLASE: Amylase: 370 U/L — ABNORMAL HIGH (ref 27–131)

## 2019-08-17 MED ORDER — HYOSCYAMINE SULFATE SL 0.125 MG SL SUBL: SUBLINGUAL_TABLET | SUBLINGUAL | 0 refills | Status: DC

## 2019-08-17 MED ORDER — POLYETHYLENE GLYCOL 3350 17 GM/SCOOP PO POWD: 17.0000 g | Freq: Two times a day (BID) | ORAL | 1 refills | Status: DC | PRN

## 2019-08-17 NOTE — Progress Notes (Signed)
Established Patient Office Visit  Subjective:  Patient ID: Victor Huber, male    DOB: 02-20-1951  Age: 69 y.o. MRN: [id number]  CC:  Chief Complaint  Patient presents with  . Abdominal Pain    stomach pain, diarrhea lots of pain after meals symptoms x 2 months patient states that medicatios no helping.     HPI Victor Huber presents for evaluation of ongoing abdominal pain and cramping.  It can be associated with dairy.  He is having some issues with constipation and using senna.  He is not taking a stool softener.  He has not been using his oxycodone recently.  He is hungry but has issue with early satiety.  Question past medical history of irritable bowel syndrome.  Does not correlate pain in the right upper quadrant an hour or 2 after a greasy meal.  Indigestion is controlled with the Prilosec.  Recent CT scan from this past month reviewed.  Past Medical History:  Diagnosis Date  . Adenocarcinoma of left lung, stage 3 ([place]) 08/15/2016  . Arthritis   . Atrial fibrillation ([place]) 08/15/2016  . Atrial fibrillation ([place])   . Cancer, metastatic to bone ([place])   . COPD (chronic obstructive pulmonary disease) ([place]) 08/15/2016  . History of chemotherapy   . History of radiation therapy   . Hyperlipidemia   . Hypothyroid 08/15/2016  . Longstanding persistent atrial fibrillation ([place]) 08/29/2016  . Pathologic fracture    left femur  . Pneumonitis   . S/P TURP 08/15/2016  . Wears glasses   . Wears hearing aid in both ears     Past Surgical History:  Procedure Laterality Date  . BRONCHOSCOPY  10/2014  . CARDIAC CATHETERIZATION     05/07/12  . CARDIOVERSION     x2  . COLONOSCOPY    . DG BIOPSY LUNG Left 10/2014   FNA - Adenocarcinoma   . FEMUR IM NAIL Left 02/19/2017  . FEMUR IM NAIL Left 02/19/2017   Procedure: INTRAMEDULLARY (IM) NAIL FEMORAL;  Surgeon: Marchia Bond, MD;  Location: [facility];  Service: Orthopedics;  Laterality: Left;  . IR FLUORO GUIDE PORT INSERTION RIGHT   07/03/2017  . IR US GUIDE VASC ACCESS RIGHT  07/03/2017  . LUNG CANCER SURGERY Left 12/2014   Wedge Resection   . MULTIPLE TOOTH EXTRACTIONS    . Status post TURP    . TONSILLECTOMY      Family History  Problem Relation Age of Onset  . Breast cancer Mother   . Breast cancer Maternal Aunt   . Breast cancer Maternal Aunt   . Lung disease Neg Hx     Social History   Socioeconomic History  . Marital status: Married    Spouse name: Not on file  . Number of children: Not on file  . Years of education: Not on file  . Highest education level: Not on file  Occupational History  . Not on file  Tobacco Use  . Smoking status: Former Smoker    Packs/day: 1.00    Years: 50.00    Pack years: 50.00    Types: Cigarettes    Quit date: 08/15/2012    Years since quitting: 7.0  . Smokeless tobacco: Never Used  Substance and Sexual Activity  . Alcohol use: No  . Drug use: No  . Sexual activity: Not on file  Other Topics Concern  . Not on file  Social History Narrative   [facility] (09/26/16):   Originally from [place]. Moved  to [facility] February 2018. Always lived in [place]. Moved to be closer to children & grandchildren. No international travel. 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 No mold exposure. No bird exposure or hot tub exposure. Enjoys reading. Previously enjoyed wood working with domestic woods.    Social Determinants of Health   Financial Resource Strain: Low Risk   . Difficulty of Paying Living Expenses: Not hard at all  Food Insecurity: No Food Insecurity  . Worried About Charity fundraiser in the Last Year: Never true  . Ran Out of Food in the Last Year: Never true  Transportation Needs: No Transportation Needs  . Lack of Transportation (Medical): No  . Lack of Transportation (Non-Medical): No  Physical Activity:   . Days of Exercise per Week:   . Minutes of Exercise per Session:   Stress:   .  Feeling of Stress :   Social Connections:   . Frequency of Communication with Friends and Family:   . Frequency of Social Gatherings with Friends and Family:   . Attends Religious Services:   . Active Member of Clubs or Organizations:   . Attends Archivist Meetings:   Marland Kitchen Marital Status:   Intimate Partner Violence:   . Fear of Current or Ex-Partner:   . Emotionally Abused:   Marland Kitchen Physically Abused:   . Sexually Abused:     Outpatient Medications Prior to Visit  Medication Sig Dispense Refill  . albuterol (VENTOLIN HFA) 108 (90 Base) MCG/ACT inhaler Inhale 1-2 puffs into the lungs every 4 (four) hours as needed for wheezing or shortness of breath. 3 month supply 24 g 3  . apixaban (ELIQUIS) 5 MG TABS tablet Take 1 tablet (5 mg total) by mouth 2 (two) times daily. 180 tablet 1  . atenolol (TENORMIN) 50 MG tablet TAKE 1 TABLET (50 MG TOTAL) BY MOUTH DAILY. 90 tablet 3  . atorvastatin (LIPITOR) 40 MG tablet Take 1 tablet (40 mg total) by mouth daily. 90 tablet 2  . citalopram (CELEXA) 40 MG tablet Take 1 tablet (40 mg total) by mouth daily. 90 tablet 1  . levothyroxine (SYNTHROID) 150 MCG tablet Take 150 mcg by mouth daily before breakfast.    . omeprazole (PRILOSEC) 40 MG capsule Take 1 capsule (40 mg total) by mouth daily. 90 capsule 1  . tamsulosin (FLOMAX) 0.4 MG CAPS capsule Take 1 capsule (0.4 mg total) by mouth daily. 90 capsule 4  . ibuprofen (ADVIL,MOTRIN) 400 MG tablet Take 400 mg by mouth every 4 (four) hours as needed for moderate pain.    Marland Kitchen levothyroxine (SYNTHROID) 25 MCG tablet TAKE 1 TABLET EVERY DAY BEFORE BREAKFAST 90 tablet 3  . Oxycodone HCl 10 MG TABS Take one daily as needed for severe back pain. (Patient not taking: Reported on 08/17/2019) 30 tablet 0   No facility-administered medications prior to visit.    Allergies  Allergen Reactions  . Carboplatin Itching, Nausea And Vomiting and Other (See Comments)    Flushing  . Flecainide Hypertension    CAUSED  HEART ISSUES   . Debrox [Carbamide Peroxide]     Swelling in ear canal.     ROS Review of Systems  Constitutional: Negative.   HENT: Negative.   Respiratory: Negative.   Cardiovascular: Negative.   Gastrointestinal: Positive for abdominal distention, abdominal pain and constipation. Negative for anal bleeding, blood in stool, nausea and vomiting.  Genitourinary: Negative.   Musculoskeletal: Negative for  gait problem and joint swelling.  Neurological: Negative.   Hematological: Does not bruise/bleed easily.      Objective:    Physical Exam  Constitutional: He is oriented to person, place, and time. He appears well-developed and well-nourished. No distress.  HENT:  Head: Normocephalic and atraumatic.  Right Ear: External ear normal.  Left Ear: External ear normal.  Eyes: Conjunctivae are normal. Right eye exhibits no discharge. Left eye exhibits no discharge. No scleral icterus.  Neck: No JVD present. No tracheal deviation present.  Cardiovascular: An irregularly irregular rhythm present.  Pulmonary/Chest: Effort normal and breath sounds normal. No stridor.  Abdominal: Soft. Bowel sounds are normal. He exhibits no distension. There is no abdominal tenderness. There is no rebound and no guarding.    Musculoskeletal:        General: No edema.  Neurological: He is alert and oriented to person, place, and time.  Skin: Skin is warm and dry. He is not diaphoretic.  Psychiatric: He has a normal mood and affect. His behavior is normal.    BP 110/70   Pulse 99   Temp 97.8 F (36.6 C)   Ht 6' 2"  (1.88 m)   Wt 274 lb 3.2 oz (124.4 kg)   SpO2 97%   BMI 35.21 kg/m  Wt Readings from Last 3 Encounters:  08/17/19 274 lb 3.2 oz (124.4 kg)  07/30/19 253 lb 6.4 oz (114.9 kg)  07/20/19 253 lb 6.4 oz (114.9 kg)     Health Maintenance Due  Topic Date Due  . Hepatitis C Screening  Never done  . COLONOSCOPY  Never done    There are no preventive care reminders to display for this  patient.  Lab Results  Component Value Date   TSH 0.103 (L) 07/30/2019   Lab Results  Component Value Date   WBC 8.5 07/30/2019   HGB 15.5 07/30/2019   HCT 44.3 07/30/2019   MCV 95.1 07/30/2019   PLT 156 07/30/2019   Lab Results  Component Value Date   NA 141 07/30/2019   K 4.3 07/30/2019   CHLORIDE 102 04/04/2017   CO2 25 07/30/2019   GLUCOSE 126 (H) 07/30/2019   BUN 17 07/30/2019   CREATININE 0.92 07/30/2019   BILITOT 1.0 07/30/2019   ALKPHOS 103 07/30/2019   AST 17 07/30/2019   ALT 18 07/30/2019   PROT 6.7 07/30/2019   ALBUMIN 3.7 07/30/2019   CALCIUM 9.1 07/30/2019   ANIONGAP 10 07/30/2019   EGFR >60 04/04/2017   GFR 67.51 03/06/2019   Lab Results  Component Value Date   CHOL 122 03/06/2019   Lab Results  Component Value Date   HDL 35.90 (L) 03/06/2019   Lab Results  Component Value Date   LDLCALC 34 12/19/2016   Lab Results  Component Value Date   TRIG 203.0 (H) 03/06/2019   Lab Results  Component Value Date   CHOLHDL 3 03/06/2019   Lab Results  Component Value Date   HGBA1C 5.4 03/06/2019      Assessment & Plan:   Problem List Items Addressed This Visit      Digestive   Calculus of gallbladder without cholecystitis without obstruction   Relevant Orders   Ambulatory referral to General Surgery   Slow transit constipation   Relevant Medications   polyethylene glycol powder (GLYCOLAX/MIRALAX) 17 GM/SCOOP powder     Other   Generalized abdominal pain - Primary   Relevant Medications   Hyoscyamine Sulfate SL (LEVSIN/SL) 0.125 MG SUBL   Other Relevant  Orders   Amylase   Lipase      Meds ordered this encounter  Medications  . Hyoscyamine Sulfate SL (LEVSIN/SL) 0.125 MG SUBL    Sig: Take one sub linguinally ever 4-6 hours as needed for cramps.    Dispense:  120 tablet    Refill:  0  . polyethylene glycol powder (GLYCOLAX/MIRALAX) 17 GM/SCOOP powder    Sig: Take 17 g by mouth 2 (two) times daily as needed.    Dispense:  3350 g     Refill:  1    Follow-up: Return in about 1 month (around 09/17/2019).  Patient will discontinue senna in favor of MiraLAX.  Continue stool softener.  Avoid dairy products.  Will use Levsin as needed.  Rechecking lipase.  [name], MD

## 2019-08-17 NOTE — Patient Instructions (Signed)

## 2019-08-18 ENCOUNTER — Emergency Department (HOSPITAL_COMMUNITY)
Admission: EM | Admit: 2019-08-18 | Discharge: 2019-08-18 | Disposition: A | Discharge status: Home / Self Care | Payer: Medicare HMO | Attending: Emergency Medicine | Admitting: Emergency Medicine

## 2019-08-18 ENCOUNTER — Other Ambulatory Visit: Payer: Self-pay

## 2019-08-18 ENCOUNTER — Encounter (HOSPITAL_COMMUNITY): Payer: Self-pay

## 2019-08-18 ENCOUNTER — Emergency Department (HOSPITAL_COMMUNITY): Payer: Medicare HMO

## 2019-08-18 DIAGNOSIS — K859 Acute pancreatitis without necrosis or infection, unspecified: Secondary | ICD-10-CM | POA: Diagnosis not present

## 2019-08-18 DIAGNOSIS — C7951 Secondary malignant neoplasm of bone: Secondary | ICD-10-CM | POA: Insufficient documentation

## 2019-08-18 DIAGNOSIS — R1013 Epigastric pain: Secondary | ICD-10-CM | POA: Diagnosis not present

## 2019-08-18 DIAGNOSIS — K8020 Calculus of gallbladder without cholecystitis without obstruction: Secondary | ICD-10-CM | POA: Diagnosis not present

## 2019-08-18 DIAGNOSIS — C3490 Malignant neoplasm of unspecified part of unspecified bronchus or lung: Secondary | ICD-10-CM | POA: Insufficient documentation

## 2019-08-18 DIAGNOSIS — R748 Abnormal levels of other serum enzymes: Secondary | ICD-10-CM | POA: Diagnosis present

## 2019-08-18 LAB — CBC WITH DIFFERENTIAL/PLATELET
Abs Immature Granulocytes: 0.01 10*3/uL (ref 0.00–0.07)
Basophils Absolute: 0 10*3/uL (ref 0.0–0.1)
Basophils Relative: 0 %
Eosinophils Absolute: 0.3 10*3/uL (ref 0.0–0.5)
Eosinophils Relative: 5 %
HCT: 41.8 % (ref 39.0–52.0)
Hemoglobin: 14.3 g/dL (ref 13.0–17.0)
Immature Granulocytes: 0 %
Lymphocytes Relative: 21 %
Lymphs Abs: 1.3 10*3/uL (ref 0.7–4.0)
MCH: 32.5 pg (ref 26.0–34.0)
MCHC: 34.2 g/dL (ref 30.0–36.0)
MCV: 95 fL (ref 80.0–100.0)
Monocytes Absolute: 0.4 10*3/uL (ref 0.1–1.0)
Monocytes Relative: 7 %
Neutro Abs: 4.1 10*3/uL (ref 1.7–7.7)
Neutrophils Relative %: 67 %
Platelets: 165 10*3/uL (ref 150–400)
RBC: 4.4 MIL/uL (ref 4.22–5.81)
RDW: 12.1 % (ref 11.5–15.5)
WBC: 6.2 10*3/uL (ref 4.0–10.5)
nRBC: 0 % (ref 0.0–0.2)

## 2019-08-18 LAB — COMPREHENSIVE METABOLIC PANEL
ALT: 26 U/L (ref 0–44)
AST: 21 U/L (ref 15–41)
Albumin: 3.6 g/dL (ref 3.5–5.0)
Alkaline Phosphatase: 87 U/L (ref 38–126)
Anion gap: 10 (ref 5–15)
BUN: 13 mg/dL (ref 8–23)
CO2: 24 mmol/L (ref 22–32)
Calcium: 8.9 mg/dL (ref 8.9–10.3)
Chloride: 104 mmol/L (ref 98–111)
Creatinine, Ser: 0.88 mg/dL (ref 0.61–1.24)
GFR calc Af Amer: >60 mL/min (ref >60)
GFR calc non Af Amer: >60 mL/min (ref >60)
Glucose, Bld: 284 mg/dL — ABNORMAL HIGH (ref 70–99)
Potassium: 4.2 mmol/L (ref 3.5–5.1)
Sodium: 138 mmol/L (ref 135–145)
Total Bilirubin: 0.5 mg/dL (ref 0.3–1.2)
Total Protein: 6.5 g/dL (ref 6.5–8.1)

## 2019-08-18 LAB — LIPASE, BLOOD: Lipase: 372 U/L — ABNORMAL HIGH (ref 11–51)

## 2019-08-18 MED ORDER — ONDANSETRON HCL 4 MG PO TABS
4.0000 mg | ORAL_TABLET | Freq: Four times a day (QID) | ORAL | 0 refills | Status: DC
Start: 2019-08-18 — End: 2019-08-20

## 2019-08-18 MED ORDER — IOHEXOL 300 MG/ML  SOLN
100.0000 mL | Freq: Once | INTRAMUSCULAR | Status: AC | PRN
  Administered 2019-08-18: 100 mL via INTRAVENOUS

## 2019-08-18 MED ORDER — SODIUM CHLORIDE 0.9 % IV BOLUS
1000.0000 mL | Freq: Once | INTRAVENOUS | Status: AC
  Administered 2019-08-18: 1000 mL via INTRAVENOUS

## 2019-08-18 MED ORDER — HEPARIN SOD (PORK) LOCK FLUSH 100 UNIT/ML IV SOLN
500.0000 [IU] | Freq: Once | INTRAVENOUS | Status: AC
  Administered 2019-08-18: 500 [IU]
  Filled 2019-08-18: qty 5

## 2019-08-18 NOTE — ED Provider Notes (Signed)
Medical screening examination/treatment/procedure(s) were conducted as a shared visit with non-physician practitioner(s) and myself.  I personally evaluated the patient during the encounter. Briefly, the patient is a 69 y.o. male is a 69 year old male with history of lung cancer on Keytruda, COPD, A. fib on blood thinner who presents to the ED with abdominal pain.  Normal vitals.  No fever.  Had lab work yesterday at primary [place] office and found to have a lipase of 1100.  He has been having on and off abdominal pain for the last 6 to 8 weeks.  Concerned that this may be due to side effect of Keytruda.  He does have a history of gallstones.  Has mostly tenderness in the epigastric region but also right upper quadrant.  Will get lab work to further evaluate and CT scan abdomen and pelvis.  Denies any heavy alcohol use.  Possibly pancreatitis from medication side effect or could be from gallstones.  Anticipate admission following work-up.  Currently pain is well controlled.  This chart was dictated using voice recognition software.  Despite best efforts to proofread,  errors can occur which can change the documentation meaning.     EKG Interpretation None          Victor Sites, DO 08/18/19 1426

## 2019-08-18 NOTE — Discharge Instructions (Addendum)
You were given a prescription for zofran to help with your nausea. Please take as directed.  Please follow up with your primary doctor within the next 5-7 days.  If you do not have a primary care provider, information for a healthcare clinic has been provided for you to make arrangements for follow up care. Please return to the ER sooner if you have any new or worsening symptoms, or if you have any of the following symptoms:  Abdominal pain that does not go away.  You have a fever.  You keep throwing up (vomiting).  The pain is felt only in portions of the abdomen. Pain in the right side could possibly be appendicitis. In an adult, pain in the left lower portion of the abdomen could be colitis or diverticulitis.  You pass bloody or black tarry stools.  There is bright red blood in the stool.  The constipation stays for more than 4 days.  There is belly (abdominal) or rectal pain.  You do not seem to be getting better.  You have any questions or concerns.

## 2019-08-18 NOTE — ED Notes (Signed)
Cup of water given for fluid challenge.

## 2019-08-18 NOTE — Telephone Encounter (Signed)
Patient needs to go to the emergency room

## 2019-08-18 NOTE — ED Provider Notes (Signed)
[facility] DEPT Provider Note   CSN: [id number] Arrival date & time: 08/18/19  1209     History Chief Complaint  Patient presents with  . Abdominal Pain    Victor Huber is a 69 y.o. male.  HPI   Pt is a 69 y/o male with a h/o lung cancer with mets to the bone, A. fib, COPD, hyperlipidemia, who presents to the ED today for eval of abnormal labs. States he had some labs drawn by his PCP due having abd pain for the last 8 weeks. His amylase and lipase were significantly elevated and he was advised to come to the ED. States pain is located to the entire abdomen. Pain is intermittent and seems to be worse after he eats, and his pain is specifically worse after eating dairy. Rates pain 4-5/10 currently. He reports associated nausea. Denies vomiting, diarrhea, hematochezia, melena, fevers, or urinary sxs.   Reports increased flatus and intermittent constipation. Last BM was this AM.   Past Medical History:  Diagnosis Date  . Adenocarcinoma of left lung, stage 3 ([place]) 08/15/2016  . Arthritis   . Atrial fibrillation ([place]) 08/15/2016  . Atrial fibrillation ([place])   . Cancer, metastatic to bone ([place])   . COPD (chronic obstructive pulmonary disease) ([place]) 08/15/2016  . History of chemotherapy   . History of radiation therapy   . Hyperlipidemia   . Hypothyroid 08/15/2016  . Longstanding persistent atrial fibrillation ([place]) 08/29/2016  . Pathologic fracture    left femur  . Pneumonitis   . S/P TURP 08/15/2016  . Wears glasses   . Wears hearing aid in both ears     Patient Active Problem List   Diagnosis Date Noted  . Generalized abdominal pain 08/17/2019  . Slow transit constipation 08/17/2019  . Calculus of gallbladder without cholecystitis without obstruction 07/06/2019  . Epigastric pain 07/06/2019  . Excessive cerumen in right ear canal 07/06/2019  . Benign prostatic hyperplasia with urinary hesitancy 03/10/2019  . Need for influenza vaccination  03/10/2019  . Encounter for antineoplastic immunotherapy 02/12/2019  . Chronic low back pain 02/03/2019  . Malignant neoplasm of unspecified part of unspecified bronchus or lung ([place]) 02/03/2019  . History of hypothyroidism 02/03/2019  . History of depression 02/03/2019  . Elevated glucose 02/03/2019  . Port-A-Cath in place 08/22/2017  . Encounter for antineoplastic chemotherapy 03/28/2017  . Screening for prostate cancer 03/28/2017  . Adenocarcinoma of left lung, stage 4 ([place]) 02/26/2017  . Metastasis to bone ([place]) 02/19/2017  . Bone metastasis ([place]), left femur 01/24/2017  . Centrilobular emphysema ([place]) 09/26/2016  . Hyperlipidemia 09/26/2016  . Anxiety disorder 09/26/2016  . Adenocarcinoma of left lung, stage 3 ([place]) 08/15/2016  . Chronic atrial fibrillation ([place]) 08/15/2016  . Chronic obstructive pulmonary disease ([place]) 08/15/2016  . S/P TURP 08/15/2016  . Hypothyroid 08/15/2016    Past Surgical History:  Procedure Laterality Date  . BRONCHOSCOPY  10/2014  . CARDIAC CATHETERIZATION     05/07/12  . CARDIOVERSION     x2  . COLONOSCOPY    . DG BIOPSY LUNG Left 10/2014   FNA - Adenocarcinoma   . FEMUR IM NAIL Left 02/19/2017  . FEMUR IM NAIL Left 02/19/2017   Procedure: INTRAMEDULLARY (IM) NAIL FEMORAL;  Surgeon: Marchia Bond, MD;  Location: [facility];  Service: Orthopedics;  Laterality: Left;  . IR FLUORO GUIDE PORT INSERTION RIGHT  07/03/2017  . IR US GUIDE VASC ACCESS RIGHT  07/03/2017  . LUNG CANCER SURGERY Left 12/2014  Wedge Resection   . MULTIPLE TOOTH EXTRACTIONS    . Status post TURP    . TONSILLECTOMY         Family History  Problem Relation Age of Onset  . Breast cancer Mother   . Breast cancer Maternal Aunt   . Breast cancer Maternal Aunt   . Lung disease Neg Hx     Social History   Tobacco Use  . Smoking status: Former Smoker    Packs/day: 1.00    Years: 50.00    Pack years: 50.00    Types: Cigarettes    Quit date: 08/15/2012    Years since  quitting: 7.0  . Smokeless tobacco: Never Used  Substance Use Topics  . Alcohol use: No  . Drug use: No    Home Medications Prior to Admission medications   Medication Sig Start Date End Date Taking? Authorizing Provider  albuterol (VENTOLIN HFA) 108 (90 Base) MCG/ACT inhaler Inhale 1-2 puffs into the lungs every 4 (four) hours as needed for wheezing or shortness of breath. 3 month supply 03/16/19   Collene Gobble, MD  apixaban (ELIQUIS) 5 MG TABS tablet Take 1 tablet (5 mg total) by mouth 2 (two) times daily. 04/13/19   Skeet Latch, MD  atenolol (TENORMIN) 50 MG tablet TAKE 1 TABLET (50 MG TOTAL) BY MOUTH DAILY. 04/24/19   Skeet Latch, MD  atorvastatin (LIPITOR) 40 MG tablet Take 1 tablet (40 mg total) by mouth daily. 03/10/19   [name], MD  citalopram (CELEXA) 40 MG tablet Take 1 tablet (40 mg total) by mouth daily. 03/10/19   [name], MD  Hyoscyamine Sulfate SL (LEVSIN/SL) 0.125 MG SUBL Take one sub linguinally ever 4-6 hours as needed for cramps. 08/17/19   [name], MD  ibuprofen (ADVIL,MOTRIN) 400 MG tablet Take 400 mg by mouth every 4 (four) hours as needed for moderate pain.    [provider]  levothyroxine (SYNTHROID) 150 MCG tablet Take 150 mcg by mouth daily before breakfast.    [provider]  omeprazole (PRILOSEC) 40 MG capsule Take 1 capsule (40 mg total) by mouth daily. 07/20/19   [name], MD  ondansetron (ZOFRAN) 4 MG tablet Take 1 tablet (4 mg total) by mouth every 6 (six) hours. 08/18/19   Zyon Rosser S, PA-C  Oxycodone HCl 10 MG TABS Take one daily as needed for severe back pain. Patient not taking: Reported on 08/17/2019 03/10/19   [name], MD  polyethylene glycol powder [facility]) 17 GM/SCOOP powder Take 17 g by mouth 2 (two) times daily as needed. 08/17/19   [name], MD  tamsulosin (FLOMAX) 0.4 MG CAPS capsule Take 1 capsule (0.4 mg total) by mouth  daily. 03/10/19   [name], MD    Allergies    Carboplatin, Flecainide, and Debrox [carbamide peroxide]  Review of Systems   Review of Systems  Constitutional: Negative for chills and fever.  HENT: Negative for ear pain and sore throat.   Eyes: Negative for visual disturbance.  Respiratory: Negative for cough and shortness of breath.   Cardiovascular: Negative for chest pain.  Gastrointestinal: Positive for abdominal pain, constipation (currently resolved) and nausea. Negative for diarrhea and vomiting.  Genitourinary: Negative for dysuria and hematuria.  Musculoskeletal: Negative for back pain.  Skin: Negative for rash.  Neurological: Negative for headaches.  All other systems reviewed and are negative.   Physical Exam Updated Vital Signs BP 124/79   Pulse 78  Temp 98.7 F (37.1 C)   Resp 16   Ht 6\' 2"  (1.88 m)   Wt 111 kg   SpO2 98%   BMI 31.43 kg/m   Physical Exam Vitals and nursing note reviewed.  Constitutional:      Appearance: He is well-developed.  HENT:     Head: Normocephalic and atraumatic.  Eyes:     Conjunctiva/sclera: Conjunctivae normal.  Cardiovascular:     Heart sounds: No murmur.     Comments: Irregularly irregular Pulmonary:     Effort: Pulmonary effort is normal. No respiratory distress.     Breath sounds: Normal breath sounds. No wheezing, rhonchi or rales.  Abdominal:     General: Bowel sounds are normal.     Palpations: Abdomen is soft.     Comments: Mild epigastric, rlq, and llq TTP. Guarding noted. No rebound TTP.   Musculoskeletal:     Cervical back: Neck supple.  Skin:    General: Skin is warm and dry.  Neurological:     Mental Status: He is alert.     ED Results / Procedures / Treatments   Labs (all labs ordered are listed, but only abnormal results are displayed) Labs Reviewed  COMPREHENSIVE METABOLIC PANEL - Abnormal; Notable for the following components:      Result Value   Glucose, Bld 284 (*)    All  other components within normal limits  LIPASE, BLOOD - Abnormal; Notable for the following components:   Lipase 372 (*)    All other components within normal limits  CBC WITH DIFFERENTIAL/PLATELET    EKG None  Radiology CT ABDOMEN PELVIS W CONTRAST  Result Date: 08/18/2019 CLINICAL DATA:  Elevated amylase and lipase levels. EXAM: CT ABDOMEN AND PELVIS WITH CONTRAST TECHNIQUE: Multidetector CT imaging of the abdomen and pelvis was performed using the standard protocol following bolus administration of intravenous contrast. CONTRAST:  176mL OMNIPAQUE IOHEXOL 300 MG/ML  SOLN COMPARISON:  March 24, 2019. FINDINGS: Lower chest: No acute abnormality. Hepatobiliary: Cholelithiasis is noted without evidence of cholecystitis. No biliary dilatation is noted. No hepatic abnormality is noted. Pancreas: Mild inflammatory changes are noted involving the pancreatic head, body and tail, consistent with acute pancreatitis. No ductal dilatation or pseudocyst formation is noted. Spleen: Normal in size without focal abnormality. Adrenals/Urinary Tract: Adrenal glands are unremarkable. Kidneys are normal, without renal calculi, focal lesion, or hydronephrosis. Bladder is unremarkable. Stomach/Bowel: Stomach is within normal limits. Appendix appears normal. No evidence of bowel wall thickening, distention, or inflammatory changes. Vascular/Lymphatic: Aortic atherosclerosis. No enlarged abdominal or pelvic lymph nodes. Reproductive: Stable mild prostatic enlargement is noted. Other: No abdominal wall hernia or abnormality. No abdominopelvic ascites. Musculoskeletal: No acute or significant osseous findings. IMPRESSION: 1. Mild inflammatory changes are noted involving the pancreatic head, body and tail, consistent with acute pancreatitis. No ductal dilatation or pseudocyst formation is noted. 2. Stable mild prostatic enlargement. 3. Cholelithiasis without evidence of cholecystitis. Aortic Atherosclerosis (ICD10-I70.0).  Electronically Signed   By: Marijo Conception M.D.   On: 08/18/2019 16:21    Procedures Procedures (including critical care time)  Medications Ordered in ED Medications  iohexol (OMNIPAQUE) 300 MG/ML solution 100 mL (100 mLs Intravenous Contrast Given 08/18/19 1552)  sodium chloride 0.9 % bolus 1,000 mL (0 mLs Intravenous Stopped 08/18/19 1757)  heparin lock flush 100 unit/mL (500 Units Intracatheter Given 08/18/19 1801)    ED Course  I have reviewed the triage vital signs and the nursing notes.  Pertinent labs & imaging results that were  available during my care of the patient were reviewed by me and considered in my medical decision making (see chart for details).    MDM Rules/Calculators/A&P                      69 year old male presenting for evaluation of abdominal pain ongoing intermittently for several weeks.  Was seen by PCP yesterday and had some labs drawn which showed elevated lipase and amylase and he was therefore sent to the ED.  On exam he does have some epigastric, right lower quadrant and left lower quadrant abdominal pain with guarding noted.  We we will get labs and CT abdomen/pelvis to rule out intra-abdominal/pelvic emergency.  CBC is without leukocytosis or anemia CMP with normal electrolytes, kidney and liver function.  Bilirubin is normal Lipase is elevated above 300.  Reviewed prior labs and lipase was elevated above the thousand yesterday.  Significant improvement noted today.  CT abd/pelvis with mild inflammatory changes involving the pancreatic head, body and tail consistent with acute pancreatitis.  No dilatation or pseudocyst formation noted.  Cholelithiasis noted without evidence of cholecystitis.  Reassessed patient.  He is in no acute distress.  He has been able to tolerate p.o. and is also receiving IV fluids.  We discussed the findings of the CT scan and laboratory work suggesting pancreatitis.  Discussed possibility for admission based on patient's  symptoms however he states he is feeling improved and does not feel like he will need admission at this time.  We discussed the plan for bowel rest and plan for transition to pancreatitis eating plan.  I also gave him information to follow-up with GI.  He also has an appointment with his oncologist in 2 days for reassessment.  Advised on specific return precautions.  He and his wife at bedside voiced understanding of plan and reasons to return.  All questions answered.  Patient stable for discharge.  Patient was seen in conjunction with Dr. Ronnald Nian, supervising physician, who personally evaluated the patient and is in agreement with the plan.   Final Clinical Impression(s) / ED Diagnoses Final diagnoses:  Acute pancreatitis, unspecified complication status, unspecified pancreatitis type    Rx / DC Orders ED Discharge Orders         Ordered    ondansetron (ZOFRAN) 4 MG tablet  Every 6 hours     08/18/19 [place], [place], PA-C 08/18/19 [place], Adam, DO 08/21/19 1512

## 2019-08-18 NOTE — ED Triage Notes (Signed)
Patient was notified that he had an elevated Amylase and lipase and as told to come to the ED. Patient is currently receiving [place].

## 2019-08-19 ENCOUNTER — Encounter: Payer: Self-pay | Admitting: Nurse Practitioner

## 2019-08-20 ENCOUNTER — Telehealth: Payer: Self-pay | Admitting: Physician Assistant

## 2019-08-20 ENCOUNTER — Inpatient Hospital Stay: Payer: Medicare HMO

## 2019-08-20 ENCOUNTER — Inpatient Hospital Stay: Payer: Medicare HMO | Attending: Internal Medicine | Admitting: Internal Medicine

## 2019-08-20 ENCOUNTER — Other Ambulatory Visit: Payer: Self-pay

## 2019-08-20 ENCOUNTER — Encounter: Payer: Self-pay | Admitting: Internal Medicine

## 2019-08-20 VITALS — BP 97/61 | HR 96 | Temp 97.9°F | Resp 17 | Ht 74.0 in | Wt 246.6 lb

## 2019-08-20 DIAGNOSIS — C3490 Malignant neoplasm of unspecified part of unspecified bronchus or lung: Secondary | ICD-10-CM | POA: Diagnosis not present

## 2019-08-20 DIAGNOSIS — Z923 Personal history of irradiation: Secondary | ICD-10-CM | POA: Insufficient documentation

## 2019-08-20 DIAGNOSIS — C3432 Malignant neoplasm of lower lobe, left bronchus or lung: Secondary | ICD-10-CM

## 2019-08-20 DIAGNOSIS — C770 Secondary and unspecified malignant neoplasm of lymph nodes of head, face and neck: Secondary | ICD-10-CM | POA: Diagnosis not present

## 2019-08-20 DIAGNOSIS — Z8719 Personal history of other diseases of the digestive system: Secondary | ICD-10-CM | POA: Insufficient documentation

## 2019-08-20 DIAGNOSIS — Z95828 Presence of other vascular implants and grafts: Secondary | ICD-10-CM

## 2019-08-20 DIAGNOSIS — E039 Hypothyroidism, unspecified: Secondary | ICD-10-CM | POA: Diagnosis not present

## 2019-08-20 DIAGNOSIS — C7951 Secondary malignant neoplasm of bone: Secondary | ICD-10-CM | POA: Diagnosis not present

## 2019-08-20 DIAGNOSIS — C3492 Malignant neoplasm of unspecified part of left bronchus or lung: Secondary | ICD-10-CM

## 2019-08-20 DIAGNOSIS — Z5112 Encounter for antineoplastic immunotherapy: Secondary | ICD-10-CM | POA: Insufficient documentation

## 2019-08-20 DIAGNOSIS — I482 Chronic atrial fibrillation, unspecified: Secondary | ICD-10-CM

## 2019-08-20 DIAGNOSIS — Z9221 Personal history of antineoplastic chemotherapy: Secondary | ICD-10-CM | POA: Diagnosis not present

## 2019-08-20 DIAGNOSIS — Z79899 Other long term (current) drug therapy: Secondary | ICD-10-CM | POA: Diagnosis not present

## 2019-08-20 LAB — CBC WITH DIFFERENTIAL (CANCER CENTER ONLY)
Abs Immature Granulocytes: 0.01 10*3/uL (ref 0.00–0.07)
Basophils Absolute: 0 10*3/uL (ref 0.0–0.1)
Basophils Relative: 0 %
Eosinophils Absolute: 0.4 10*3/uL (ref 0.0–0.5)
Eosinophils Relative: 5 %
HCT: 42 % (ref 39.0–52.0)
Hemoglobin: 14.7 g/dL (ref 13.0–17.0)
Immature Granulocytes: 0 %
Lymphocytes Relative: 18 %
Lymphs Abs: 1.2 10*3/uL (ref 0.7–4.0)
MCH: 32.6 pg (ref 26.0–34.0)
MCHC: 35 g/dL (ref 30.0–36.0)
MCV: 93.1 fL (ref 80.0–100.0)
Monocytes Absolute: 0.5 10*3/uL (ref 0.1–1.0)
Monocytes Relative: 7 %
Neutro Abs: 4.6 10*3/uL (ref 1.7–7.7)
Neutrophils Relative %: 70 %
Platelet Count: 177 10*3/uL (ref 150–400)
RBC: 4.51 MIL/uL (ref 4.22–5.81)
RDW: 12 % (ref 11.5–15.5)
WBC Count: 6.7 10*3/uL (ref 4.0–10.5)
nRBC: 0 % (ref 0.0–0.2)

## 2019-08-20 LAB — CMP (CANCER CENTER ONLY)
ALT: 21 U/L (ref 0–44)
AST: 16 U/L (ref 15–41)
Albumin: 3.4 g/dL — ABNORMAL LOW (ref 3.5–5.0)
Alkaline Phosphatase: 104 U/L (ref 38–126)
Anion gap: 10 (ref 5–15)
BUN: 8 mg/dL (ref 8–23)
CO2: 24 mmol/L (ref 22–32)
Calcium: 9.2 mg/dL (ref 8.9–10.3)
Chloride: 105 mmol/L (ref 98–111)
Creatinine: 0.9 mg/dL (ref 0.61–1.24)
GFR, Est AFR Am: >60 mL/min (ref >60)
GFR, Estimated: >60 mL/min (ref >60)
Glucose, Bld: 167 mg/dL — ABNORMAL HIGH (ref 70–99)
Potassium: 4.1 mmol/L (ref 3.5–5.1)
Sodium: 139 mmol/L (ref 135–145)
Total Bilirubin: 0.7 mg/dL (ref 0.3–1.2)
Total Protein: 6.6 g/dL (ref 6.5–8.1)

## 2019-08-20 LAB — TSH: TSH: 6.489 u[IU]/mL — ABNORMAL HIGH (ref 0.320–4.118)

## 2019-08-20 MED ORDER — SODIUM CHLORIDE 0.9% FLUSH
10.0000 mL | INTRAVENOUS | Status: DC | PRN
  Administered 2019-08-20: 10 mL
  Filled 2019-08-20: qty 10

## 2019-08-20 MED ORDER — HEPARIN SOD (PORK) LOCK FLUSH 100 UNIT/ML IV SOLN
500.0000 [IU] | Freq: Once | INTRAVENOUS | Status: AC | PRN
  Administered 2019-08-20: 500 [IU]
  Filled 2019-08-20: qty 5

## 2019-08-20 NOTE — Progress Notes (Signed)
[place] Telephone:(336) [phone]   Fax:(336) [phone]  OFFICE PROGRESS NOTE  Victor Maw, MD [place] [id number]  DIAGNOSIS: Metastatic non-small cell lung cancer initially diagnosed as stage IIIA (T2a, N2, M0) non-small cell lung cancer, poorly differentiated adenocarcinoma presented with left lower lobe lung mass in addition to mediastinal lymphadenopathy.  The patient was diagnosed with metastatic disease involving the left femur as well as left supraclavicular nodal metastases and right paratracheal lymphadenopathy in October 2018.  Biomarker Findings Microsatellite Status - MS-Stable Tumor Mutational Burden - TMB-Low (3 Muts/Mb) Genomic Findings For a complete list of the genes assayed, please refer to the Appendix. STK11 P25f*6 [id number] site 1503+5W>SDAXX E374* MLL2 [id number] NBN [id number] NOTCH2 [id number] splice site [id number]>FDisease relevant genes with no reportable alterations: EGFR, KRAS, ALK, BRAF, MET, RET, ERBB2, ROS1   PDL1 expression 5%  PRIOR THERAPY: 1) status post wedge resection of the left lower lobe lung mass as well as AP window lymph node dissection but there was residual metastatic mediastinal lymphadenopathy that could not be resected. 2) a course of concurrent chemoradiation [place] carbopl[place]  3) status post palliative radiotherapy to the left femur metastatic bone disease. 4)  Systemic chemotherapy with carboplatin for AUC of 5, Alimta 500 mg/M2 and Keytruda 200 mg IV every 3 weeks.  First dose March 07, 2017.  Carboplatin was discontinued during cycle #2 secondary to hypersensitivity reaction. 5) status post 2 cycles of maintenance treatment with Alimta and Ketruda (pembrolizumab).  Alimta was discontinued secondary to intolerance.  CURRENT THERAPY: Maintenance treatment with single agent Ketruda (pembrolizumab) status  post 37  cycles.  INTERVAL HISTORY: [name] 56o. male returns to the clinic today for follow-up visit.  The patient is feeling fine today with no concerning complaints except for mild abdominal pain.  He was seen recently by his primary care physician for severe abdominal pain and his serum lipase was significantly elevated up to 1138.  The patient was sent to the emergency department and repeat lipase on 08/18/2019 was better and down to 372.  He had a CT of the abdomen performed at that time that showed mild pancreatitis.  The patient is here today for evaluation before his treatment with Keytruda.  He denied having any current chest pain, shortness of breath, cough or hemoptysis.  He denied having any fever or chills.  He has no nausea, vomiting, diarrhea or constipation.  MEDICAL HISTORY: Past Medical History:  Diagnosis Date  . Adenocarcinoma of left lung, stage 3 ([facility]/16/2018  . Arthritis   . [place]rial fibrillation ([place]/16/[place]  . Atrial fibrillation ([facility] . Cancer, metastatic to bone ([facility] . COPD (chronic obstructive pulmonary disease) ([facility]/16/2018  . History of chemotherapy   . History of radiation therapy   . Hyperlipidemia   . Hypothyroid 08/15/2016  . Longstanding persistent atrial fibrillation ([facility]/30/2018  . Pathologic fracture    left femur  . Pneumonitis   . S/P TURP 08/15/2016  . Wears glasses   . Wears hearing aid in both ears     ALLERGIES:  is allergic to carboplatin; flecainide; and debrox [carbamide peroxide].  MEDICATIONS:  Current Outpatient Medications  Medication Sig Dispense Refill  . albuterol (VENTOLIN HFA) 108 (90 Base) MCG/ACT inhaler Inhale 1-2 puffs into the lungs every 4 (four) hours as needed for wheezing or shortness of breath. 3  month supply 24 g 3  . apixaban (ELIQUIS) 5 MG TABS tablet Take 1 tablet (5 mg total) by mouth 2 (two) times daily. 180 tablet 1  . atenolol (TENORMIN) 50 MG tablet TAKE 1 TABLET (50 MG TOTAL) BY  MOUTH DAILY. 90 tablet 3  . atorvastatin (LIPITOR) 40 MG tablet Take 1 tablet (40 mg total) by mouth daily. 90 tablet 2  . citalopram (CELEXA) 40 MG tablet Take 1 tablet (40 mg total) by mouth daily. 90 tablet 1  . Hyoscyamine Sulfate SL (LEVSIN/SL) 0.125 MG SUBL Take one sub linguinally ever 4-6 hours as needed for cramps. 120 tablet 0  . ibuprofen (ADVIL,MOTRIN) 400 MG tablet Take 400 mg by mouth every 4 (four) hours as needed for moderate pain.    Marland Kitchen levothyroxine (SYNTHROID) 150 MCG tablet Take 150 mcg by mouth daily before breakfast.    . omeprazole (PRILOSEC) 40 MG capsule Take 1 capsule (40 mg total) by mouth daily. 90 capsule 1  . ondansetron (ZOFRAN) 4 MG tablet Take 1 tablet (4 mg total) by mouth every 6 (six) hours. 12 tablet 0  . Oxycodone HCl 10 MG TABS Take one daily as needed for severe back pain. (Patient not taking: Reported on 08/17/2019) 30 tablet 0  . polyethylene glycol powder (GLYCOLAX/MIRALAX) 17 GM/SCOOP powder Take 17 g by mouth 2 (two) times daily as needed. 3350 g 1  . tamsulosin (FLOMAX) 0.4 MG CAPS capsule Take 1 capsule (0.4 mg total) by mouth daily. 90 capsule 4   No current facility-administered medications for this visit.   Facility-Administered Medications Ordered in Other Visits  Medication Dose Route Frequency Provider Last Rate Last Admin  . sodium chloride flush (NS) 0.9 % injection 10 mL  10 mL Intracatheter PRN Victor Bears, MD   10 mL at 08/20/19 6962    SURGICAL HISTORY:  Past Surgical History:  Procedure Laterality Date  . BRONCHOSCOPY  10/2014  . CARDIAC CATHETERIZATION     05/07/12  . CARDIOVERSION     x2  . COLONOSCOPY    . DG BIOPSY LUNG Left 10/2014   FNA - Adenocarcinoma   . FEMUR IM NAIL Left 02/19/2017  . FEMUR IM NAIL Left 02/19/2017   Procedure: INTRAMEDULLARY (IM) NAIL FEMORAL;  Surgeon: Marchia Bond, MD;  Location: [facility];  Service: Orthopedics;  Laterality: Left;  . IR FLUORO GUIDE PORT INSERTION RIGHT  07/03/2017  . IR US GUIDE  VASC ACCESS RIGHT  07/03/2017  . LUNG CANCER SURGERY Left 12/2014   Wedge Resection   . MULTIPLE TOOTH EXTRACTIONS    . Status post TURP    . TONSILLECTOMY      REVIEW OF SYSTEMS:  Constitutional: positive for fatigue Eyes: negative Ears, nose, mouth, throat, and face: negative Respiratory: positive for dyspnea on exertion Cardiovascular: negative Gastrointestinal: positive for abdominal pain Genitourinary:negative Integument/breast: negative Hematologic/lymphatic: negative Musculoskeletal:negative Neurological: negative Behavioral/Psych: negative Endocrine: negative Allergic/Immunologic: negative   PHYSICAL EXAMINATION: General appearance: alert, cooperative, fatigued and no distress Head: Normocephalic, without obvious abnormality, atraumatic Neck: no adenopathy, no JVD, supple, symmetrical, trachea midline and thyroid not enlarged, symmetric, no tenderness/mass/nodules Lymph nodes: Cervical, supraclavicular, and axillary nodes normal. Resp: clear to auscultation bilaterally Back: symmetric, no curvature. ROM normal. No CVA tenderness. Cardio: regular rate and rhythm, S1, S2 normal, no murmur, click, rub or gallop GI: soft, non-tender; bowel sounds normal; no masses,  no organomegaly Extremities: extremities normal, atraumatic, no cyanosis or edema Neurologic: Alert and oriented X 3, normal strength and tone. Normal symmetric  reflexes. Normal coordination and gait  ECOG PERFORMANCE STATUS: 1 - Symptomatic but completely ambulatory  Blood pressure 97/61, pulse 96, temperature 97.9 F (36.6 C), temperature source Temporal, resp. rate 17, height 6' 2" (1.88 m), weight 246 lb 9.6 oz (111.9 kg), SpO2 97 %.  LABORATORY DATA: Lab Results  Component Value Date   WBC 6.7 08/20/2019   HGB 14.7 08/20/2019   HCT 42.0 08/20/2019   MCV 93.1 08/20/2019   PLT 177 08/20/2019      Chemistry      Component Value Date/Time   NA 138 08/18/2019 1417   NA 136 04/04/2017 1141   K 4.2  08/18/2019 1417   K 4.4 04/04/2017 1141   CL 104 08/18/2019 1417   CO2 24 08/18/2019 1417   CO2 24 04/04/2017 1141   BUN 13 08/18/2019 1417   BUN 21.1 04/04/2017 1141   CREATININE 0.88 08/18/2019 1417   CREATININE 0.92 07/30/2019 1111   CREATININE 1.1 04/04/2017 1141      Component Value Date/Time   CALCIUM 8.9 08/18/2019 1417   CALCIUM 9.1 04/04/2017 1141   ALKPHOS 87 08/18/2019 1417   ALKPHOS 89 04/04/2017 1141   AST 21 08/18/2019 1417   AST 17 07/30/2019 1111   AST 17 04/04/2017 1141   ALT 26 08/18/2019 1417   ALT 18 07/30/2019 1111   ALT 18 04/04/2017 1141   BILITOT 0.5 08/18/2019 1417   BILITOT 1.0 07/30/2019 1111   BILITOT 0.67 04/04/2017 1141       RADIOGRAPHIC STUDIES: CT ABDOMEN PELVIS W CONTRAST  Result Date: 08/18/2019 CLINICAL DATA:  Elevated amylase and lipase levels. EXAM: CT ABDOMEN AND PELVIS WITH CONTRAST TECHNIQUE: Multidetector CT imaging of the abdomen and pelvis was performed using the standard protocol following bolus administration of intravenous contrast. CONTRAST:  13m OMNIPAQUE IOHEXOL 300 MG/ML  SOLN COMPARISON:  March 24, 2019. FINDINGS: Lower chest: No acute abnormality. Hepatobiliary: Cholelithiasis is noted without evidence of cholecystitis. No biliary dilatation is noted. No hepatic abnormality is noted. Pancreas: Mild inflammatory changes are noted involving the pancreatic head, body and tail, consistent with acute pancreatitis. No ductal dilatation or pseudocyst formation is noted. Spleen: Normal in size without focal abnormality. Adrenals/Urinary Tract: Adrenal glands are unremarkable. Kidneys are normal, without renal calculi, focal lesion, or hydronephrosis. Bladder is unremarkable. Stomach/Bowel: Stomach is within normal limits. Appendix appears normal. No evidence of bowel wall thickening, distention, or inflammatory changes. Vascular/Lymphatic: Aortic atherosclerosis. No enlarged abdominal or pelvic lymph nodes. Reproductive: Stable mild  prostatic enlargement is noted. Other: No abdominal wall hernia or abnormality. No abdominopelvic ascites. Musculoskeletal: No acute or significant osseous findings. IMPRESSION: 1. Mild inflammatory changes are noted involving the pancreatic head, body and tail, consistent with acute pancreatitis. No ductal dilatation or pseudocyst formation is noted. 2. Stable mild prostatic enlargement. 3. Cholelithiasis without evidence of cholecystitis. Aortic Atherosclerosis (ICD10-I70.0). Electronically Signed   By: JMarijo ConceptionM.D.   On: 08/18/2019 16:21    ASSESSMENT AND PLAN: This is a 69years old white male with metastatic non-small cell lung cancer, adenocarcinoma with no actionable mutations and PDL 1 expression of 5% that was initially diagnosed as stage IIIa non-small cell lung cancer, adenocarcinoma status post left lower lobectomy with lymph node dissection followed by a course of concurrent chemoradiation completed in January 2016.  The patient had evidence for disease metastasis in October 2018 with metastatic disease to the left femur as well as left supraclavicular and right paratracheal lymph nodes. The patient is currently  on systemic chemotherapy initially was with carboplatin, Alimta and Keytruda.  Carboplatin was discontinued secondary to hypersensitivity reaction starting from cycle #2.   He was also treated with 3 cycles of maintenance Alimta and Ketruda (pembrolizumab) but Alimta was discontinued secondary to intolerance. He is currently undergoing treatment with maintenance Keytruda as a single agent status post 37 cycles.  The patient has been tolerating his treatment well with no concerning adverse effects. He was recently diagnosed with acute pancreatitis concerning to be secondary to gallstones but immunotherapy mediated is also a consideration. I recommended for the patient to delay his treatment with Keytruda until next week to give him more time to recover from the recent episode of  pancreatitis. We will see the patient back for follow-up visit in 4 weeks for evaluation before the following cycle of his treatment. For the hypothyroidism, he will continue his current treatment with levothyroxine. The patient was advised to call immediately if he has any concerning symptoms in the interval. The patient voices understanding of current disease status and treatment options and is in agreement with the current care plan. All questions were answered. The patient knows to call the clinic with any problems, questions or concerns. We can certainly see the patient much sooner if necessary.  Disclaimer: This note was dictated with voice recognition software. Similar sounding words can inadvertently be transcribed and may not be corrected upon review.

## 2019-08-20 NOTE — Telephone Encounter (Signed)
Attempted to call the patient about his TSH from today. His TSH is elevated. Dose recently reduced to 150 due to 175 mcg causing low TSH on blood work 3 weeks ago. Left a message informing the patient of his TSH and that his results are on mychart. His PCP is managing his dose. Will relay message again to patient when he returns to the clinic next week for a follow up as well.

## 2019-08-21 ENCOUNTER — Telehealth: Payer: Self-pay | Admitting: Internal Medicine

## 2019-08-21 NOTE — Telephone Encounter (Signed)
Scheduled appt per 5/20 los- pt aware of nest appt scheduled.

## 2019-08-25 ENCOUNTER — Encounter: Payer: Self-pay | Admitting: Family Medicine

## 2019-08-26 ENCOUNTER — Other Ambulatory Visit: Payer: Medicare HMO

## 2019-08-26 ENCOUNTER — Other Ambulatory Visit: Payer: Self-pay | Admitting: Medical Oncology

## 2019-08-26 ENCOUNTER — Ambulatory Visit: Payer: Medicare HMO

## 2019-08-26 DIAGNOSIS — C3490 Malignant neoplasm of unspecified part of unspecified bronchus or lung: Secondary | ICD-10-CM

## 2019-08-26 NOTE — Telephone Encounter (Signed)
He needs to see both. Pancreatitis could be related to his gallstones.

## 2019-08-27 ENCOUNTER — Inpatient Hospital Stay: Payer: Medicare HMO

## 2019-08-27 ENCOUNTER — Other Ambulatory Visit: Payer: Self-pay

## 2019-08-27 VITALS — BP 106/66 | HR 57 | Temp 97.9°F | Resp 18

## 2019-08-27 DIAGNOSIS — C7951 Secondary malignant neoplasm of bone: Secondary | ICD-10-CM

## 2019-08-27 DIAGNOSIS — C3432 Malignant neoplasm of lower lobe, left bronchus or lung: Secondary | ICD-10-CM | POA: Diagnosis not present

## 2019-08-27 DIAGNOSIS — C3490 Malignant neoplasm of unspecified part of unspecified bronchus or lung: Secondary | ICD-10-CM

## 2019-08-27 DIAGNOSIS — Z9221 Personal history of antineoplastic chemotherapy: Secondary | ICD-10-CM | POA: Diagnosis not present

## 2019-08-27 DIAGNOSIS — E039 Hypothyroidism, unspecified: Secondary | ICD-10-CM | POA: Diagnosis not present

## 2019-08-27 DIAGNOSIS — C770 Secondary and unspecified malignant neoplasm of lymph nodes of head, face and neck: Secondary | ICD-10-CM | POA: Diagnosis not present

## 2019-08-27 DIAGNOSIS — C3492 Malignant neoplasm of unspecified part of left bronchus or lung: Secondary | ICD-10-CM

## 2019-08-27 DIAGNOSIS — Z923 Personal history of irradiation: Secondary | ICD-10-CM | POA: Diagnosis not present

## 2019-08-27 DIAGNOSIS — Z5112 Encounter for antineoplastic immunotherapy: Secondary | ICD-10-CM | POA: Diagnosis not present

## 2019-08-27 DIAGNOSIS — Z8719 Personal history of other diseases of the digestive system: Secondary | ICD-10-CM | POA: Diagnosis not present

## 2019-08-27 LAB — CMP (CANCER CENTER ONLY)
ALT: 18 U/L (ref 0–44)
AST: 15 U/L (ref 15–41)
Albumin: 3.8 g/dL (ref 3.5–5.0)
Alkaline Phosphatase: 97 U/L (ref 38–126)
Anion gap: 7 (ref 5–15)
BUN: 8 mg/dL (ref 8–23)
CO2: 27 mmol/L (ref 22–32)
Calcium: 9.1 mg/dL (ref 8.9–10.3)
Chloride: 105 mmol/L (ref 98–111)
Creatinine: 0.8 mg/dL (ref 0.61–1.24)
GFR, Est AFR Am: >60 mL/min (ref >60)
GFR, Estimated: >60 mL/min (ref >60)
Glucose, Bld: 145 mg/dL — ABNORMAL HIGH (ref 70–99)
Potassium: 4.3 mmol/L (ref 3.5–5.1)
Sodium: 139 mmol/L (ref 135–145)
Total Bilirubin: 0.9 mg/dL (ref 0.3–1.2)
Total Protein: 6.7 g/dL (ref 6.5–8.1)

## 2019-08-27 LAB — CBC WITH DIFFERENTIAL (CANCER CENTER ONLY)
Abs Immature Granulocytes: 0.02 10*3/uL (ref 0.00–0.07)
Basophils Absolute: 0 10*3/uL (ref 0.0–0.1)
Basophils Relative: 0 %
Eosinophils Absolute: 0.3 10*3/uL (ref 0.0–0.5)
Eosinophils Relative: 5 %
HCT: 42.4 % (ref 39.0–52.0)
Hemoglobin: 14.8 g/dL (ref 13.0–17.0)
Immature Granulocytes: 0 %
Lymphocytes Relative: 26 %
Lymphs Abs: 1.7 10*3/uL (ref 0.7–4.0)
MCH: 32.7 pg (ref 26.0–34.0)
MCHC: 34.9 g/dL (ref 30.0–36.0)
MCV: 93.6 fL (ref 80.0–100.0)
Monocytes Absolute: 0.5 10*3/uL (ref 0.1–1.0)
Monocytes Relative: 8 %
Neutro Abs: 3.8 10*3/uL (ref 1.7–7.7)
Neutrophils Relative %: 61 %
Platelet Count: 166 10*3/uL (ref 150–400)
RBC: 4.53 MIL/uL (ref 4.22–5.81)
RDW: 12.5 % (ref 11.5–15.5)
WBC Count: 6.2 10*3/uL (ref 4.0–10.5)
nRBC: 0 % (ref 0.0–0.2)

## 2019-08-27 MED ORDER — HEPARIN SOD (PORK) LOCK FLUSH 100 UNIT/ML IV SOLN
500.0000 [IU] | Freq: Once | INTRAVENOUS | Status: AC | PRN
  Administered 2019-08-27: 500 [IU]
  Filled 2019-08-27: qty 5

## 2019-08-27 MED ORDER — SODIUM CHLORIDE 0.9% FLUSH
10.0000 mL | INTRAVENOUS | Status: DC | PRN
  Administered 2019-08-27: 10 mL
  Filled 2019-08-27: qty 10

## 2019-08-27 MED ORDER — SODIUM CHLORIDE 0.9 % IV SOLN
Freq: Once | INTRAVENOUS | Status: AC
  Filled 2019-08-27: qty 250

## 2019-08-27 MED ORDER — SODIUM CHLORIDE 0.9 % IV SOLN
200.0000 mg | Freq: Once | INTRAVENOUS | Status: AC
  Administered 2019-08-27: 200 mg via INTRAVENOUS
  Filled 2019-08-27: qty 8

## 2019-08-27 NOTE — Patient Instructions (Signed)
[facility] Discharge Instructions for Patients Receiving Chemotherapy  Today you received the following chemotherapy agents: pembrolizumab.  To help prevent nausea and vomiting after your treatment, we encourage you to take your nausea medication as directed.   If you develop nausea and vomiting that is not controlled by your nausea medication, call the clinic.   BELOW ARE SYMPTOMS THAT SHOULD BE REPORTED IMMEDIATELY:  *FEVER GREATER THAN 100.5 F  *CHILLS WITH OR WITHOUT FEVER  NAUSEA AND VOMITING THAT IS NOT CONTROLLED WITH YOUR NAUSEA MEDICATION  *UNUSUAL SHORTNESS OF BREATH  *UNUSUAL BRUISING OR BLEEDING  TENDERNESS IN MOUTH AND THROAT WITH OR WITHOUT PRESENCE OF ULCERS  *URINARY PROBLEMS  *BOWEL PROBLEMS  UNUSUAL RASH Items with * indicate a potential emergency and should be followed up as soon as possible.  Feel free to call the clinic should you have any questions or concerns. The clinic phone number is [phone].  Please show the CHEMO ALERT CARD at check-in to the Emergency Department and triage nurse.   

## 2019-08-28 LAB — TSH: TSH: 2.426 u[IU]/mL (ref 0.320–4.118)

## 2019-09-09 ENCOUNTER — Encounter: Payer: Self-pay | Admitting: Emergency Medicine

## 2019-09-09 ENCOUNTER — Ambulatory Visit (INDEPENDENT_AMBULATORY_CARE_PROVIDER_SITE_OTHER): Payer: Medicare HMO | Admitting: Emergency Medicine

## 2019-09-09 ENCOUNTER — Other Ambulatory Visit: Payer: Self-pay

## 2019-09-09 DIAGNOSIS — R0609 Other forms of dyspnea: Secondary | ICD-10-CM

## 2019-09-09 DIAGNOSIS — C3492 Malignant neoplasm of unspecified part of left bronchus or lung: Secondary | ICD-10-CM

## 2019-09-09 DIAGNOSIS — R0600 Dyspnea, unspecified: Secondary | ICD-10-CM | POA: Insufficient documentation

## 2019-09-09 NOTE — Assessment & Plan Note (Signed)
Dyspnea on exertion.  He does have obstructive lung disease on spirometry but he did not respond to [employer] trial 6 months ago.  He notes that he has had decreased exercise and may have lost some of his cardiopulmonary conditioning.  He thinks he responds to albuterol but is not sure.  I have asked him to increase his exercise to build up his stamina.  Also asked him to start using albuterol judiciously but more frequently, keep track of whether he benefits.  Follow-up in 2 months to assess his status, dyspnea.  If he gets significant benefit from the albuterol then we could revisit schedule BD

## 2019-09-09 NOTE — Patient Instructions (Signed)
Work more on increasing your exercise routine so we can build back your exertional tolerance.  Walking oximetry on room air today  Keep albuterol available to use 2 puffs up to every 4 hours if needed for shortness of breath, chest tightness, wheezing.  Consider pre-treating exercise by 10 minutes with 2 puffs  Get your Ct chest with Dr Julien Nordmann as planned.  Follow with Dr. Lamonte Sakai in 2 months or sooner if you have any problems.

## 2019-09-09 NOTE — Progress Notes (Signed)
Subjective:    Patient ID: Victor Huber, male    DOB: Jun 24, 1950, 69 y.o.   MRN: [id number]  HPI Mr. Karasik is a 69 year old former smoker (50 pack years) with a history of COPD and stage IV non-small cell lung cancer, adenoCA.  He underwent left lower lobe wedge resection and chemoradiation, subsequently on Keytruda.  Most recent chest imaging 01/01/2019, reviewed and shows stable mediastinal adenopathy, postoperative and postradiation changes in the left inferior hilum, no evidence of new disease.  He is currently managed on albuterol which he uses as needed.  He uses this rarely.   He may be coughing more, non-productive. He is having more dyspnea, happens with bending. He can walk around the block, exert but has to stop to rest. He has some wheeze in the am, coughs up some mucous in the am. Has some nasal gtt at night.   ROV 09/09/19 --this follow-up visit for 69 year old gentleman with a history of COPD and stage IV adenocarcinoma of the lung.  He underwent a left lower lobe wedge resection and then chemoradiation, Keytruda.  Most recent CT imaging 06/15/2019 reviewed by me, shows overall stable mediastinal and subcarinal lymphadenopathy, stable right upper lobe scar, stable right gastric adenopathy.  No evidence of recurrence.  In 03/2019 we did trial starting Stiolto to see if he would get benefit. He didn't fell that it helped him to any degree. He is having some increased exertional SOB with walking but notes that his exercise routine is decreased, out of shape. He uses albuterol every few weeks, believes that it probably helps him some.    Review of Systems  Constitutional: Negative for fever and unexpected weight change.  HENT: Negative for congestion, dental problem, ear pain, nosebleeds, postnasal drip, rhinorrhea, sinus pressure, sneezing, sore throat and trouble swallowing.   Eyes: Negative for redness and itching.  Respiratory: Positive for cough and shortness of breath. Negative  for chest tightness and wheezing.   Cardiovascular: Positive for palpitations. Negative for leg swelling.  Gastrointestinal: Negative for nausea and vomiting.  Genitourinary: Negative for dysuria.  Musculoskeletal: Negative for joint swelling.  Skin: Positive for rash.  Neurological: Negative for headaches.  Hematological: Does not bruise/bleed easily.  Psychiatric/Behavioral: Negative for dysphoric mood. The patient is not nervous/anxious.     Past Medical History:  Diagnosis Date  . Adenocarcinoma of left lung, stage 3 ([place]) 08/15/2016  . Arthritis   . Atrial fibrillation ([place]) 08/15/2016  . Atrial fibrillation ([place])   . Cancer, metastatic to bone ([place])   . COPD (chronic obstructive pulmonary disease) ([place]) 08/15/2016  . History of chemotherapy   . History of radiation therapy   . Hyperlipidemia   . Hypothyroid 08/15/2016  . Longstanding persistent atrial fibrillation ([place]) 08/29/2016  . Pathologic fracture    left femur  . Pneumonitis   . S/P TURP 08/15/2016  . Wears glasses   . Wears hearing aid in both ears      Family History  Problem Relation Age of Onset  . Breast cancer Mother   . Breast cancer Maternal Aunt   . Breast cancer Maternal Aunt   . Lung disease Neg Hx      Social History   Socioeconomic History  . Marital status: Married    Spouse name: Not on file  . Number of children: Not on file  . Years of education: Not on file  . Highest education level: Not on file  Occupational History  . Not on file  Tobacco  Use  . Smoking status: Former Smoker    Packs/day: 1.00    Years: 50.00    Pack years: 50.00    Types: Cigarettes    Quit date: 08/15/2012    Years since quitting: 7.0  . Smokeless tobacco: Never Used  Substance and Sexual Activity  . Alcohol use: No  . Drug use: No  . Sexual activity: Not on file  Other Topics Concern  . Not on file  Social History Narrative   [facility] (09/26/16):   Originally from [place]. Moved to [facility] February  2018. Always lived in [place]. Moved to be closer to children & grandchildren. No international travel. 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 No mold exposure. No bird exposure or hot tub exposure. Enjoys reading. Previously enjoyed wood working with domestic woods.    Social Determinants of Health   Financial Resource Strain: Low Risk   . Difficulty of Paying Living Expenses: Not hard at all  Food Insecurity: No Food Insecurity  . Worried About Charity fundraiser in the Last Year: Never true  . Ran Out of Food in the Last Year: Never true  Transportation Needs: No Transportation Needs  . Lack of Transportation (Medical): No  . Lack of Transportation (Non-Medical): No  Physical Activity:   . Days of Exercise per Week:   . Minutes of Exercise per Session:   Stress:   . Feeling of Stress :   Social Connections:   . Frequency of Communication with Friends and Family:   . Frequency of Social Gatherings with Friends and Family:   . Attends Religious Services:   . Active Member of Clubs or Organizations:   . Attends Archivist Meetings:   Marland Kitchen Marital Status:   Intimate Partner Violence:   . Fear of Current or Ex-Partner:   . Emotionally Abused:   Marland Kitchen Physically Abused:   . Sexually Abused:   Lots of sawdust exposure.  No military  Allergies  Allergen Reactions  . Carboplatin Itching, Nausea And Vomiting and Other (See Comments)    Flushing  . Flecainide Hypertension    CAUSED HEART ISSUES   . Debrox [Carbamide Peroxide]     Swelling in ear canal.      Outpatient Medications Prior to Visit  Medication Sig Dispense Refill  . albuterol (VENTOLIN HFA) 108 (90 Base) MCG/ACT inhaler Inhale 1-2 puffs into the lungs every 4 (four) hours as needed for wheezing or shortness of breath. 3 month supply 24 g 3  . apixaban (ELIQUIS) 5 MG TABS tablet Take 1 tablet (5 mg total) by mouth 2 (two) times daily. 180 tablet  1  . atenolol (TENORMIN) 50 MG tablet TAKE 1 TABLET (50 MG TOTAL) BY MOUTH DAILY. 90 tablet 3  . atorvastatin (LIPITOR) 40 MG tablet Take 1 tablet (40 mg total) by mouth daily. 90 tablet 2  . citalopram (CELEXA) 40 MG tablet Take 1 tablet (40 mg total) by mouth daily. 90 tablet 1  . ibuprofen (ADVIL,MOTRIN) 400 MG tablet Take 400 mg by mouth every 4 (four) hours as needed for moderate pain.    Marland Kitchen levothyroxine (SYNTHROID) 150 MCG tablet Take 150 mcg by mouth daily before breakfast.    . Oxycodone HCl 10 MG TABS Take one daily as needed for severe back pain. 30 tablet 0  . polyethylene glycol powder (GLYCOLAX/MIRALAX) 17 GM/SCOOP powder Take 17 g by mouth 2 (two) times daily as needed. [place]  g 1  . tamsulosin (FLOMAX) 0.4 MG CAPS capsule Take 1 capsule (0.4 mg total) by mouth daily. 90 capsule 4   No facility-administered medications prior to visit.         Objective:   Physical Exam  Today's Vitals   09/09/19 1342  BP: 118/60  Pulse: (!) 55  Temp: 98.3 F (36.8 C)  TempSrc: Oral  SpO2: 93%  Weight: 236 lb 6.4 oz (107.2 kg)  Height: 6\' 2"  (1.88 m)   Body mass index is 30.35 kg/m.   Gen: Pleasant, overweight man, in no distress,  normal affect  ENT: No lesions,  mouth clear,  oropharynx clear, no postnasal drip  Neck: No JVD, no stridor  Lungs: No use of accessory muscles, good air movement, no crackles, no wheezing on a normal breath, no wheeze on a forced exp.   Cardiovascular: RRR, heart sounds normal, no murmur or gallops, no peripheral edema  Musculoskeletal: No deformities, no cyanosis or clubbing  Neuro: alert, awake, non focal  Skin: Warm, no lesions or rash     Assessment & Plan:  Dyspnea Dyspnea on exertion.  He does have obstructive lung disease on spirometry but he did not respond to [employer] trial 6 months ago.  He notes that he has had decreased exercise and may have lost some of his cardiopulmonary conditioning.  He thinks he responds to albuterol but is  not sure.  I have asked him to increase his exercise to build up his stamina.  Also asked him to start using albuterol judiciously but more frequently, keep track of whether he benefits.  Follow-up in 2 months to assess his status, dyspnea.  If he gets significant benefit from the albuterol then we could revisit schedule BD  Adenocarcinoma of left lung, stage 4 (HCC) His repeat CT scan this month.  He will follow-up with Dr. Julien Nordmann.    Baltazar Apo, MD, PhD 09/09/2019, 2:09 PM [facility] [phone] or if no answer [phone]

## 2019-09-09 NOTE — Assessment & Plan Note (Signed)
His repeat CT scan this month.  He will follow-up with Dr. Julien Nordmann.

## 2019-09-10 ENCOUNTER — Ambulatory Visit: Payer: Medicare HMO

## 2019-09-10 ENCOUNTER — Ambulatory Visit: Payer: Medicare HMO | Admitting: Physician Assistant

## 2019-09-10 ENCOUNTER — Ambulatory Visit (INDEPENDENT_AMBULATORY_CARE_PROVIDER_SITE_OTHER): Payer: Medicare HMO | Admitting: Nurse Practitioner

## 2019-09-10 ENCOUNTER — Encounter: Payer: Self-pay | Admitting: Nurse Practitioner

## 2019-09-10 ENCOUNTER — Other Ambulatory Visit: Payer: Medicare HMO

## 2019-09-10 ENCOUNTER — Other Ambulatory Visit (INDEPENDENT_AMBULATORY_CARE_PROVIDER_SITE_OTHER): Payer: Medicare HMO

## 2019-09-10 VITALS — BP 102/76 | HR 71 | Ht 74.0 in | Wt 236.1 lb

## 2019-09-10 DIAGNOSIS — R1013 Epigastric pain: Secondary | ICD-10-CM

## 2019-09-10 DIAGNOSIS — K859 Acute pancreatitis without necrosis or infection, unspecified: Secondary | ICD-10-CM

## 2019-09-10 DIAGNOSIS — C3490 Malignant neoplasm of unspecified part of unspecified bronchus or lung: Secondary | ICD-10-CM | POA: Diagnosis not present

## 2019-09-10 LAB — CBC WITH DIFFERENTIAL/PLATELET
Basophils Absolute: 0 10*3/uL (ref 0.0–0.1)
Basophils Relative: 0.3 % (ref 0.0–3.0)
Eosinophils Absolute: 0.2 10*3/uL (ref 0.0–0.7)
Eosinophils Relative: 2.3 % (ref 0.0–5.0)
HCT: 42.8 % (ref 39.0–52.0)
Hemoglobin: 15 g/dL (ref 13.0–17.0)
Lymphocytes Relative: 27 % (ref 12.0–46.0)
Lymphs Abs: 2.1 10*3/uL (ref 0.7–4.0)
MCHC: 35 g/dL (ref 30.0–36.0)
MCV: 93.8 fl (ref 78.0–100.0)
Monocytes Absolute: 0.6 10*3/uL (ref 0.1–1.0)
Monocytes Relative: 7.9 % (ref 3.0–12.0)
Neutro Abs: 4.8 10*3/uL (ref 1.4–7.7)
Neutrophils Relative %: 62.5 % (ref 43.0–77.0)
Platelets: 174 10*3/uL (ref 150.0–400.0)
RBC: 4.56 Mil/uL (ref 4.22–5.81)
RDW: 13.4 % (ref 11.5–15.5)
WBC: 7.6 10*3/uL (ref 4.0–10.5)

## 2019-09-10 LAB — COMPREHENSIVE METABOLIC PANEL
ALT: 12 U/L (ref 0–53)
AST: 14 U/L (ref 0–37)
Albumin: 4.2 g/dL (ref 3.5–5.2)
Alkaline Phosphatase: 100 U/L (ref 39–117)
BUN: 14 mg/dL (ref 6–23)
CO2: 27 mEq/L (ref 19–32)
Calcium: 9.6 mg/dL (ref 8.4–10.5)
Chloride: 100 mEq/L (ref 96–112)
Creatinine, Ser: 0.93 mg/dL (ref 0.40–1.50)
GFR: 80.11 mL/min (ref >60.00)
Glucose, Bld: 222 mg/dL — ABNORMAL HIGH (ref 70–99)
Potassium: 4.2 mEq/L (ref 3.5–5.1)
Sodium: 133 mEq/L — ABNORMAL LOW (ref 135–145)
Total Bilirubin: 0.8 mg/dL (ref 0.2–1.2)
Total Protein: 7 g/dL (ref 6.0–8.3)

## 2019-09-10 LAB — TRIGLYCERIDES: Triglycerides: 164 mg/dL — ABNORMAL HIGH (ref 0.0–149.0)

## 2019-09-10 LAB — AMYLASE: Amylase: 71 U/L (ref 27–131)

## 2019-09-10 LAB — LIPASE: Lipase: 178 U/L — ABNORMAL HIGH (ref 11.0–59.0)

## 2019-09-10 LAB — C-REACTIVE PROTEIN: CRP: 1.2 mg/dL (ref 0.5–20.0)

## 2019-09-10 MED ORDER — FAMOTIDINE 40 MG PO TABS: 40.0000 mg | ORAL_TABLET | Freq: Every day | ORAL | 0 refills | Status: DC

## 2019-09-10 NOTE — Progress Notes (Signed)
09/10/2019 Victor Huber [id number] [birth year]   CHIEF COMPLAINT: Pancreatitis follow up  HISTORY OF PRESENT ILLNESS:  Victor Huber d. Stiefel is a 69 year old male with a past medical history significant for arthritis, atrial fibrillation on Eliquis, hypothyroidism, COPD, non-small cell lung cancer with metastasis to the left femur and left supraclavicular node, mediastinal adenopathy s/p  wedge resection of the left lower lung mass and AP window lymph node dissection in 2016 treated with radiation and chemo consisting of weekly Carboplatin and Paclitaxel while living in [place].  He also required palliative radiotherapy to the left femur in 2018.  He is currently on Keytruda infusions every 3 weeks for the past 2 years (37 cycles) followed by Dr. Julien Nordmann.   He developed significant epigastric pain which started 2 months ago.  At that time, he described his epigastric pain as constant which worsened after eating.  His stomach felt full for 4 to 6 hours after eating.  His epigastric pain would subside but as soon as he would eat again the pain would recur.  He had slight nausea without vomiting.  He also felt as if he might have diarrhea but did not.  He was seen by his primary care physician and he was prescribed Omeprazole 20 mg daily without improvement.  He continued to have intermittent epigastric pain for the past 6 to 8 weeks.  He underwent laboratory studies as ordered by his PCP 5/17 which identified a lipase level of 1138.  He was sent to [facility] emergency room on 08/18/2019 for further evaluation.  Laboratory studies in the ED showed a WBC 6.2.  Hemoglobin 14.8.  Hematocrit 42.4.  Lipase 372.  AST 21.  ALT 26.  Total bili 0.5.  Alk phos 87.  Glucose 284.  An abdominal/pelvic CT showed mild inflammatory changes involving the pancreatic head, body and tail consistent with acute pancreatitis.  No ductal dilatation or mass was identified.  Gallstones were present without evidence of  cholecystitis. There was some concern the contributor may have caused his pancreatitis. He was discharged home with instructions to follow-up with his oncologist and to schedule a GI consult for further pancreatitis evaluation.  He was seen in the office by his oncologist Dr. Earlie Server on 08/20/2019.  His Keytruda infusion was placed on hold till his pancreatitis completely resolved.  He presents to our office for further pancreatitis evaluation.   Currently, he continues to have epigastric pain.  No significant nausea.  No vomiting. He reports a prior history of alcohol abuse, previously drank a sixpack of beer daily from the age 30 to age 57.  No alcohol for the past 30 years.  No prior history of pancreatitis.  No history of hypertriglyceridemia.  He has lost 20 pounds over the past month which she attributes to decreased food intake secondary to his epigastric pain.  No fever, sweats or chills.  He complains of having dysphagia with pills, liquids and solid food for the past 6 months.  He describes having food, liquids or pills which gets stuck to the throat/upper esophageal area.  He initially developed dysphagia shortly after his radiation therapy in 2016 which occurred for a few months then went away.  He is tolerating soup, eggs, toast, protein shake with 1% milk, chicken, salmon and cooked vegetables.  No occasionally takes Ibuprofen. He denies having any lower abdominal pain.  He is passing a normal brown soft bowel movement daily.  No rectal bleeding or melena.  He underwent  a colonoscopy in [place] in 2014 which she reported was normal.  No family history of colorectal cancer.  Abdominal/pelvic CT 08/18/2019:  1. Mild inflammatory changes are noted involving the pancreatic head, body and tail, consistent with acute pancreatitis. No ductal dilatation or pseudocyst formation is noted. 2. Stable mild prostatic enlargement. 3. Cholelithiasis without evidence of cholecystitis. 4. Aortic  Atherosclerosis    CBC Latest Ref Rng & Units 08/27/2019 08/20/2019 08/18/2019  WBC 4.0 - 10.5 K/uL 6.2 6.7 6.2  Hemoglobin 13.0 - 17.0 g/dL 14.8 14.7 14.3  Hematocrit 39 - 52 % 42.4 42.0 41.8  Platelets 150 - 400 K/uL 166 177 165   CMP Latest Ref Rng & Units 08/27/2019 08/20/2019 08/18/2019  Glucose 70 - 99 mg/dL 145(H) 167(H) 284(H)  BUN 8 - 23 mg/dL 8 8 13   Creatinine 0.61 - 1.24 mg/dL 0.80 0.90 0.88  Sodium 135 - 145 mmol/L 139 139 138  Potassium 3.5 - 5.1 mmol/L 4.3 4.1 4.2  Chloride 98 - 111 mmol/L 105 105 104  CO2 22 - 32 mmol/L 27 24 24   Calcium 8.9 - 10.3 mg/dL 9.1 9.2 8.9  Total Protein 6.5 - 8.1 g/dL 6.7 6.6 6.5  Total Bilirubin 0.3 - 1.2 mg/dL 0.9 0.7 0.5  Alkaline Phos 38 - 126 U/L 97 104 87  AST 15 - 41 U/L 15 16 21   ALT 0 - 44 U/L 18 21 26     Past Medical History:  Diagnosis Date  . Adenocarcinoma of left lung, stage 3 ([place]) 08/15/2016  . Arthritis   . Atrial fibrillation ([place]) 08/15/2016  . Atrial fibrillation ([place])   . Cancer, metastatic to bone ([place])   . COPD (chronic obstructive pulmonary disease) ([place]) 08/15/2016  . History of chemotherapy   . History of radiation therapy   . Hyperlipidemia   . Hypothyroid 08/15/2016  . Longstanding persistent atrial fibrillation ([place]) 08/29/2016  . Pathologic fracture    left femur  . Pneumonitis   . S/P TURP 08/15/2016  . Wears glasses   . Wears hearing aid in both ears    Past Surgical History:  Procedure Laterality Date  . BRONCHOSCOPY  10/2014  . CARDIAC CATHETERIZATION     05/07/12  . CARDIOVERSION     x2  . COLONOSCOPY    . DG BIOPSY LUNG Left 10/2014   FNA - Adenocarcinoma   . FEMUR IM NAIL Left 02/19/2017  . FEMUR IM NAIL Left 02/19/2017   Procedure: INTRAMEDULLARY (IM) NAIL FEMORAL;  Surgeon: Marchia Bond, MD;  Location: [facility];  Service: Orthopedics;  Laterality: Left;  . IR FLUORO GUIDE PORT INSERTION RIGHT  07/03/2017  . IR US GUIDE VASC ACCESS RIGHT  07/03/2017  . LUNG CANCER SURGERY Left 12/2014   Wedge  Resection   . MULTIPLE TOOTH EXTRACTIONS    . Status post TURP    . TONSILLECTOMY      Social History: He is married.  He has 2 sons and 1 daughter.  He is retired.  Past smoker 1ppd x 50 years, quit smoking cigarettes 7 years ago.  He drank a sixpack of beer from the age of [age].  No alcohol for the past 30 years.  No history of drug use.  Family history: Mother and maternal aunt with breast cancer.  No family history of esophageal, gastric or colon cancer.  Allergies  Allergen Reactions  . Carboplatin Itching, Nausea And Vomiting and Other (See Comments)    Flushing  . Flecainide Hypertension    CAUSED HEART  ISSUES   . Debrox [Carbamide Peroxide]     Swelling in ear canal.       Outpatient Encounter Medications as of 09/10/2019  Medication Sig  . albuterol (VENTOLIN HFA) 108 (90 Base) MCG/ACT inhaler Inhale 1-2 puffs into the lungs every 4 (four) hours as needed for wheezing or shortness of breath. 3 month supply  . apixaban (ELIQUIS) 5 MG TABS tablet Take 1 tablet (5 mg total) by mouth 2 (two) times daily.  Marland Kitchen atenolol (TENORMIN) 50 MG tablet TAKE 1 TABLET (50 MG TOTAL) BY MOUTH DAILY.  Marland Kitchen atorvastatin (LIPITOR) 40 MG tablet Take 1 tablet (40 mg total) by mouth daily.  . citalopram (CELEXA) 40 MG tablet Take 1 tablet (40 mg total) by mouth daily.  Marland Kitchen ibuprofen (ADVIL,MOTRIN) 400 MG tablet Take 400 mg by mouth every 4 (four) hours as needed for moderate pain.  Marland Kitchen levothyroxine (SYNTHROID) 150 MCG tablet Take 150 mcg by mouth daily before breakfast.  . Oxycodone HCl 10 MG TABS Take one daily as needed for severe back pain.  . polyethylene glycol powder (GLYCOLAX/MIRALAX) 17 GM/SCOOP powder Take 17 g by mouth 2 (two) times daily as needed.  . tamsulosin (FLOMAX) 0.4 MG CAPS capsule Take 1 capsule (0.4 mg total) by mouth daily.   No facility-administered encounter medications on file as of 09/10/2019.     REVIEW OF SYSTEMS: All other systems reviewed and negative except where noted in  the History of Present Illness.   PHYSICAL EXAM: BP 102/76   Pulse 71   Ht 6' 2"  (1.88 m)   Wt 236 lb 2 oz (107.1 kg)   BMI 30.32 kg/m  General: Well developed  69 year old male in no acute distress. Head: Normocephalic and atraumatic. Eyes:  Sclerae non-icteric, conjunctive pink. Ears: Normal auditory acuity. Mouth: Dentition intact. No ulcers or lesions.  Neck: Supple, no lymphadenopathy or thyromegaly.  Lungs: Diminished breath sounds left mid lung. No wheezes, crackles or rhonchi. Heart: Irregular rhythm. No murmur, rub or gallop appreciated.  Abdomen: Soft, non distended.  Mild epigastric tenderness without rebound or guarding.  No masses. No hepatosplenomegaly. Normoactive bowel sounds x 4 quadrants.  Rectal: Deferred. Musculoskeletal: Symmetrical with no gross deformities. Skin: Warm and dry. No rash or lesions on visible extremities. Extremities: No edema. Neurological: Alert oriented x 4, no focal deficits.  Psychological:  Alert and cooperative. Normal mood and affect.  ASSESSMENT AND PLAN:  73.  69 year old male with  non-small cell lung cancer with metastasis to the left femur and left supraclavicular node on Keytruda presents for further evaluation regarding epigastric pain and acute pancreatitis with lipase level 1138 -> 372 and CT abdomen pelvis 5/18 showed mild inflammatory changes involving the pancreatic head, body and tail consistent with acute pancreatitis and gallstones without evidence of acute cholecystitis.  Keytruda infusions possibly contributing to pancreatitis. Keytruda infusions on hold until pancreatitis resolved.  -Abdominal MRI and MRCP with/without contrast (patient has titanium rod in leg, [facility] radiology to verify if compatible for MRI) -Lipase, Amylase, CMP, CBC and triglyceride level. -Eventual EGD  -Avoid fatty/fried foods -Famotidine 40 mg once daily (no improvement on Omeprazole)  2.  Dysphagia. History of radiation tx for lung cancer. Past  smoker.  -Consider barium swallow with tablet -Eventual EGD -Await his abdominal MRI/MRCP results prior to ordering barium swallow and EGD.  3.  Colon cancer screening.  Reported colonoscopy in 2014 was normal. -Request copy of the patient's colonoscopy records at time of next office follow-up appointment  4.  Atrial fibrillation on Eliquis      CC:  [name],*

## 2019-09-10 NOTE — Patient Instructions (Addendum)
If you are age 71 or older, your body mass index should be between 23-30. Your Body mass index is 30.32 kg/m. If this is out of the aforementioned range listed, please consider follow up with your Primary Care Provider.  If you are age 52 or younger, your body mass index should be between 19-25. Your Body mass index is 30.32 kg/m. If this is out of the aformentioned range listed, please consider follow up with your Primary Care Provider.   Avoid greasy and fatty foods.  We have sent the following medications to your pharmacy for you to pick up at your convenience: Famotidine 40 mg  Your provider has requested that you go to the basement level for lab work before leaving today. Press "B" on the elevator. The lab is located at the first door on the left as you exit the elevator.  You have been scheduled for an MRI at [facility] on 09/22/2019. Your appointment time is at 6:30 pm. Please arrive 15 minutes prior to your appointment time for registration purposes. Please make certain not to have anything to eat or drink 4 hours prior to your test. In addition, if you have any metal in your body, have a pacemaker or defibrillator, please be sure to let your ordering physician know. This test typically takes 45 minutes to 1 hour to complete. Should you need to reschedule, please call [phone] to do so.  Further recommendation to be determined after MRI/MRCP results are received.  Due to recent changes in healthcare laws, you may see the results of your imaging and laboratory studies on MyChart before your provider has had a chance to review them.  We understand that in some cases there may be results that are confusing or concerning to you. Not all laboratory results come back in the same time frame and the provider may be waiting for multiple results in order to interpret others.  Please give [place] 48 hours in order for your provider to thoroughly review all the results before contacting the office for  clarification of your results.   Please call the office should your symptoms worsen. [phone]  Thank you for choosing [facility] Noralyn Pick, CRNP

## 2019-09-13 NOTE — Progress Notes (Signed)
Attending Physician's Attestation   I have reviewed the chart.   I agree with the Advanced Practitioner's note, impression, and recommendations with any updates as below.  I believe the MRI/MRCP will be reasonable.  However if he is not a candidate for it than a follow-up imaging study in approximately 4 to 6 weeks from his last would be reasonable. Agree with endoscopy for rule out PPI. Would be pretty aggressive with his acid suppression so agree with famotidine 40 mg daily.  I would also recommend we go ahead and just have him on a high-dose PPI trial of Nexium 40 mg twice daily or Dexilant 60 mg daily.   Justice Britain, MD [facility]oscopy Office # [phone]

## 2019-09-16 ENCOUNTER — Telehealth: Payer: Self-pay | Admitting: Nurse Practitioner

## 2019-09-16 NOTE — Telephone Encounter (Signed)
[name], pls contact the patient and let him know Dr. Rush Landmark would like him to take Nexium 40mg  one po bid and continue Famotidine 40mg  which he can take at bed time. Please send RX for Nexium 40mg  po bid # 60, 1 refill.   Let the patient know, we will schedule an EGD with Dr. Rush Landmark after we receive his MRI/MRCP results, scheduled on 6/22. Thanks

## 2019-09-17 ENCOUNTER — Other Ambulatory Visit: Payer: Self-pay

## 2019-09-17 ENCOUNTER — Inpatient Hospital Stay: Payer: Medicare HMO | Attending: Internal Medicine

## 2019-09-17 ENCOUNTER — Inpatient Hospital Stay (HOSPITAL_BASED_OUTPATIENT_CLINIC_OR_DEPARTMENT_OTHER): Payer: Medicare HMO | Admitting: Internal Medicine

## 2019-09-17 ENCOUNTER — Inpatient Hospital Stay: Payer: Medicare HMO

## 2019-09-17 ENCOUNTER — Encounter: Payer: Self-pay | Admitting: Internal Medicine

## 2019-09-17 ENCOUNTER — Other Ambulatory Visit: Payer: Self-pay | Admitting: Internal Medicine

## 2019-09-17 VITALS — BP 107/54 | HR 82 | Temp 97.9°F | Resp 17 | Ht 74.0 in | Wt 238.3 lb

## 2019-09-17 DIAGNOSIS — C3490 Malignant neoplasm of unspecified part of unspecified bronchus or lung: Secondary | ICD-10-CM

## 2019-09-17 DIAGNOSIS — C770 Secondary and unspecified malignant neoplasm of lymph nodes of head, face and neck: Secondary | ICD-10-CM

## 2019-09-17 DIAGNOSIS — C3432 Malignant neoplasm of lower lobe, left bronchus or lung: Secondary | ICD-10-CM | POA: Insufficient documentation

## 2019-09-17 DIAGNOSIS — C7951 Secondary malignant neoplasm of bone: Secondary | ICD-10-CM

## 2019-09-17 DIAGNOSIS — Z79899 Other long term (current) drug therapy: Secondary | ICD-10-CM | POA: Insufficient documentation

## 2019-09-17 DIAGNOSIS — Z9221 Personal history of antineoplastic chemotherapy: Secondary | ICD-10-CM | POA: Insufficient documentation

## 2019-09-17 DIAGNOSIS — C3492 Malignant neoplasm of unspecified part of left bronchus or lung: Secondary | ICD-10-CM | POA: Diagnosis not present

## 2019-09-17 DIAGNOSIS — Z923 Personal history of irradiation: Secondary | ICD-10-CM | POA: Insufficient documentation

## 2019-09-17 DIAGNOSIS — E039 Hypothyroidism, unspecified: Secondary | ICD-10-CM | POA: Insufficient documentation

## 2019-09-17 DIAGNOSIS — Z95828 Presence of other vascular implants and grafts: Secondary | ICD-10-CM

## 2019-09-17 DIAGNOSIS — Z5112 Encounter for antineoplastic immunotherapy: Secondary | ICD-10-CM

## 2019-09-17 DIAGNOSIS — Z8719 Personal history of other diseases of the digestive system: Secondary | ICD-10-CM | POA: Diagnosis not present

## 2019-09-17 LAB — CMP (CANCER CENTER ONLY)
ALT: 16 U/L (ref 0–44)
AST: 16 U/L (ref 15–41)
Albumin: 3.6 g/dL (ref 3.5–5.0)
Alkaline Phosphatase: 101 U/L (ref 38–126)
Anion gap: 10 (ref 5–15)
BUN: 12 mg/dL (ref 8–23)
CO2: 22 mmol/L (ref 22–32)
Calcium: 9 mg/dL (ref 8.9–10.3)
Chloride: 108 mmol/L (ref 98–111)
Creatinine: 0.84 mg/dL (ref 0.61–1.24)
GFR, Est AFR Am: >60 mL/min (ref >60)
GFR, Estimated: >60 mL/min (ref >60)
Glucose, Bld: 178 mg/dL — ABNORMAL HIGH (ref 70–99)
Potassium: 3.9 mmol/L (ref 3.5–5.1)
Sodium: 140 mmol/L (ref 135–145)
Total Bilirubin: 0.8 mg/dL (ref 0.3–1.2)
Total Protein: 6.1 g/dL — ABNORMAL LOW (ref 6.5–8.1)

## 2019-09-17 LAB — CBC WITH DIFFERENTIAL (CANCER CENTER ONLY)
Abs Immature Granulocytes: 0.02 10*3/uL (ref 0.00–0.07)
Basophils Absolute: 0 10*3/uL (ref 0.0–0.1)
Basophils Relative: 0 %
Eosinophils Absolute: 0.2 10*3/uL (ref 0.0–0.5)
Eosinophils Relative: 3 %
HCT: 38.5 % — ABNORMAL LOW (ref 39.0–52.0)
Hemoglobin: 13.6 g/dL (ref 13.0–17.0)
Immature Granulocytes: 0 %
Lymphocytes Relative: 28 %
Lymphs Abs: 1.7 10*3/uL (ref 0.7–4.0)
MCH: 32 pg (ref 26.0–34.0)
MCHC: 35.3 g/dL (ref 30.0–36.0)
MCV: 90.6 fL (ref 80.0–100.0)
Monocytes Absolute: 0.6 10*3/uL (ref 0.1–1.0)
Monocytes Relative: 9 %
Neutro Abs: 3.6 10*3/uL (ref 1.7–7.7)
Neutrophils Relative %: 60 %
Platelet Count: 140 10*3/uL — ABNORMAL LOW (ref 150–400)
RBC: 4.25 MIL/uL (ref 4.22–5.81)
RDW: 13.2 % (ref 11.5–15.5)
WBC Count: 6.1 10*3/uL (ref 4.0–10.5)
nRBC: 0 % (ref 0.0–0.2)

## 2019-09-17 LAB — TSH: TSH: 2.23 u[IU]/mL (ref 0.320–4.118)

## 2019-09-17 MED ORDER — SODIUM CHLORIDE 0.9 % IV SOLN
Freq: Once | INTRAVENOUS | Status: AC
  Filled 2019-09-17: qty 250

## 2019-09-17 MED ORDER — SODIUM CHLORIDE 0.9% FLUSH
10.0000 mL | INTRAVENOUS | Status: DC | PRN
  Administered 2019-09-17: 10 mL
  Filled 2019-09-17: qty 10

## 2019-09-17 MED ORDER — ESOMEPRAZOLE MAGNESIUM 40 MG PO CPDR
40.0000 mg | DELAYED_RELEASE_CAPSULE | Freq: Two times a day (BID) | ORAL | 1 refills | Status: DC
Start: 2019-09-17 — End: 2019-10-29

## 2019-09-17 MED ORDER — HEPARIN SOD (PORK) LOCK FLUSH 100 UNIT/ML IV SOLN
500.0000 [IU] | Freq: Once | INTRAVENOUS | Status: AC | PRN
  Administered 2019-09-17: 500 [IU]
  Filled 2019-09-17: qty 5

## 2019-09-17 MED ORDER — SODIUM CHLORIDE 0.9 % IV SOLN
200.0000 mg | Freq: Once | INTRAVENOUS | Status: AC
  Administered 2019-09-17: 200 mg via INTRAVENOUS
  Filled 2019-09-17: qty 8

## 2019-09-17 NOTE — Progress Notes (Signed)
[facility] [phone]   [phone]  OFFICE PROGRESS NOTE  Victor Maw, Victor Huber [place] [address]  DIAGNOSIS: Metastatic non-small cell lung cancer initially diagnosed as stage IIIA (T2a, N2, M0) non-small cell lung cancer, poorly differentiated adenocarcinoma presented with left lower lobe lung mass in addition to mediastinal lymphadenopathy.  The patient was diagnosed with metastatic disease involving the left femur as well as left supraclavicular nodal metastases and right paratracheal lymphadenopathy in October 2018.  Biomarker Findings Microsatellite Status - MS-Stable Tumor Mutational Burden - TMB-Low (3 Muts/Mb) Genomic Findings For a complete list of the genes assayed, please refer to the Appendix. STK11 P266f*6 [id number] site 1891+[id number]>IDAXX E374* MLL2 [id number] NBN [id number] NOTCH2 [id number] splice site [id number]>KDisease relevant genes with no reportable alterations: EGFR, KRAS, ALK, BRAF, MET, RET, ERBB2, ROS1   PDL1 expression 5%  PRIOR THERAPY: 1) status post wedge resection of the left lower lobe lung mass as well as AP window lymph node dissection but there was residual metastatic mediastinal lymphadenopathy that could not be resected. 2) a course of concurrent chemoradiation [place] carboplatin [place]  3) status post palliative radiotherapy to the left femur metastatic bone disease. 4)  Systemic chemotherapy with carboplatin for AUC of 5, Alimta 500 mg/M2 and Keytruda 200 mg IV every 3 weeks.  First dose March 07, 2017.  Carboplatin was discontinued during cycle #2 secondary to hypersensitivity reaction. 5) status post 2 cycles of maintenance treatment with Alimta and Ketruda (pembrolizumab).  Alimta was discontinued secondary to intolerance.  CURRENT THERAPY: Maintenance treatment with single agent Ketruda (pembrolizumab) status  post 39  cycles.  INTERVAL HISTORY: Victor Huber 63o. male returns to the clinic today for follow-up visit.  The patient is feeling fine today with no concerning complaints.  He continues to have intermittent abdominal pain.  He was diagnosed with mild acute pancreatitis several weeks ago.  He is feeling much better.  He is scheduled to have MRI of the abdomen next week.  He denied having any current chest pain, shortness breath, cough or hemoptysis.  He denied having any fever or chills.  He has no nausea, vomiting, diarrhea or constipation.  He denied having any headache or visual changes.  The patient is here today for evaluation before starting cycle #44 of his treatment.  MEDICAL HISTORY: Past Medical History:  Diagnosis Date  . Adenocarcinoma of left lung, stage 3 ([facility]/16/2018  . Arthritis   . Atrial fibrillation ([facility]/16/2018  . Atrial fibrillation ([facility] . Cancer, metastatic to bone ([facility] . COPD (chronic obstructive pulmonary disease) ([facility]/16/2018  . History of chemotherapy   . History of radiation therapy   . Hyperlipidemia   . Hypothyroid 08/15/2016  . Longstanding persistent atrial fibrillation ([facility]/30/2018  . Pathologic fracture    left femur  . Pneumonitis   . S/P TURP 08/15/2016  . Wears glasses   . Wears hearing aid in both ears     ALLERGIES:  is allergic to carboplatin, flecainide, and debrox [carbamide peroxide].  MEDICATIONS:  Current Outpatient Medications  Medication Sig Dispense Refill  . albuterol (VENTOLIN HFA) 108 (90 Base) MCG/ACT inhaler Inhale 1-2 puffs into the lungs every 4 (four) hours as needed for wheezing or shortness of breath. 3 month supply 24 g 3  . apixaban (ELIQUIS) 5 MG TABS tablet Take 1 tablet (5 mg  total) by mouth 2 (two) times daily. 180 tablet 1  . atenolol (TENORMIN) 50 MG tablet TAKE 1 TABLET (50 MG TOTAL) BY MOUTH DAILY. 90 tablet 3  . atorvastatin (LIPITOR) 40 MG tablet Take 1 tablet (40 mg total) by mouth daily.  90 tablet 2  . citalopram (CELEXA) 40 MG tablet Take 1 tablet (40 mg total) by mouth daily. 90 tablet 1  . famotidine (PEPCID) 40 MG tablet Take 1 tablet (40 mg total) by mouth daily. 30 tablet 0  . ibuprofen (ADVIL,MOTRIN) 400 MG tablet Take 400 mg by mouth every 4 (four) hours as needed for moderate pain.    Marland Kitchen levothyroxine (SYNTHROID) 150 MCG tablet Take 150 mcg by mouth daily before breakfast.    . Oxycodone HCl 10 MG TABS Take one daily as needed for severe back pain. 30 tablet 0  . polyethylene glycol powder (GLYCOLAX/MIRALAX) 17 GM/SCOOP powder Take 17 g by mouth 2 (two) times daily as needed. 3350 g 1  . tamsulosin (FLOMAX) 0.4 MG CAPS capsule Take 1 capsule (0.4 mg total) by mouth daily. 90 capsule 4   No current facility-administered medications for this visit.    SURGICAL HISTORY:  Past Surgical History:  Procedure Laterality Date  . BRONCHOSCOPY  10/2014  . CARDIAC CATHETERIZATION     05/07/12  . CARDIOVERSION     x2  . COLONOSCOPY    . DG BIOPSY LUNG Left 10/2014   FNA - Adenocarcinoma   . FEMUR IM NAIL Left 02/19/2017  . FEMUR IM NAIL Left 02/19/2017   Procedure: INTRAMEDULLARY (IM) NAIL FEMORAL;  Surgeon: Marchia Bond, Victor Huber;  Location: [facility];  Service: Orthopedics;  Laterality: Left;  . IR FLUORO GUIDE PORT INSERTION RIGHT  07/03/2017  . IR US GUIDE VASC ACCESS RIGHT  07/03/2017  . LUNG CANCER SURGERY Left 12/2014   Wedge Resection   . MULTIPLE TOOTH EXTRACTIONS    . Status post TURP    . TONSILLECTOMY      REVIEW OF SYSTEMS:  A comprehensive review of systems was negative except for: Gastrointestinal: positive for abdominal pain   PHYSICAL EXAMINATION: General appearance: alert, cooperative, fatigued and no distress Head: Normocephalic, without obvious abnormality, atraumatic Neck: no adenopathy, no JVD, supple, symmetrical, trachea midline and thyroid not enlarged, symmetric, no tenderness/mass/nodules Lymph nodes: Cervical, supraclavicular, and axillary nodes  normal. Resp: clear to auscultation bilaterally Back: symmetric, no curvature. ROM normal. No CVA tenderness. Cardio: regular rate and rhythm, S1, S2 normal, no murmur, click, rub or gallop GI: soft, non-tender; bowel sounds normal; no masses,  no organomegaly Extremities: extremities normal, atraumatic, no cyanosis or edema  ECOG PERFORMANCE STATUS: 1 - Symptomatic but completely ambulatory  Blood pressure (!) 107/54, pulse 82, temperature 97.9 F (36.6 C), temperature source Temporal, resp. rate 17, height _0  (1.88 m), weight 238 lb 4.8 oz (108.1 kg), SpO2 97 %.  LABORATORY DATA: Lab Results  Component Value Date   WBC 7.6 09/10/2019   HGB 15.0 09/10/2019   HCT 42.8 09/10/2019   MCV 93.8 09/10/2019   PLT 174.0 09/10/2019      Chemistry      Component Value Date/Time   NA 133 (L) 09/10/2019 1640   NA 136 04/04/2017 1141   K 4.2 09/10/2019 1640   K 4.4 04/04/2017 1141   CL 100 09/10/2019 1640   CO2 27 09/10/2019 1640   CO2 24 04/04/2017 1141   BUN 14 09/10/2019 1640   BUN 21.1 04/04/2017 1141   CREATININE 0.93 09/10/2019  1640   CREATININE 0.80 08/27/2019 1410   CREATININE 1.1 04/04/2017 1141      Component Value Date/Time   CALCIUM 9.6 09/10/2019 1640   CALCIUM 9.1 04/04/2017 1141   ALKPHOS 100 09/10/2019 1640   ALKPHOS 89 04/04/2017 1141   AST 14 09/10/2019 1640   AST 15 08/27/2019 1410   AST 17 04/04/2017 1141   ALT 12 09/10/2019 1640   ALT 18 08/27/2019 1410   ALT 18 04/04/2017 1141   BILITOT 0.8 09/10/2019 1640   BILITOT 0.9 08/27/2019 1410   BILITOT 0.67 04/04/2017 1141       RADIOGRAPHIC STUDIES: CT ABDOMEN PELVIS W CONTRAST  Result Date: 08/18/2019 CLINICAL DATA:  Elevated amylase and lipase levels. EXAM: CT ABDOMEN AND PELVIS WITH CONTRAST TECHNIQUE: Multidetector CT imaging of the abdomen and pelvis was performed using the standard protocol following bolus administration of intravenous contrast. CONTRAST:  136m OMNIPAQUE IOHEXOL 300 MG/ML  SOLN  COMPARISON:  March 24, 2019. FINDINGS: Lower chest: No acute abnormality. Hepatobiliary: Cholelithiasis is noted without evidence of cholecystitis. No biliary dilatation is noted. No hepatic abnormality is noted. Pancreas: Mild inflammatory changes are noted involving the pancreatic head, body and tail, consistent with acute pancreatitis. No ductal dilatation or pseudocyst formation is noted. Spleen: Normal in size without focal abnormality. Adrenals/Urinary Tract: Adrenal glands are unremarkable. Kidneys are normal, without renal calculi, focal lesion, or hydronephrosis. Bladder is unremarkable. Stomach/Bowel: Stomach is within normal limits. Appendix appears normal. No evidence of bowel wall thickening, distention, or inflammatory changes. Vascular/Lymphatic: Aortic atherosclerosis. No enlarged abdominal or pelvic lymph nodes. Reproductive: Stable mild prostatic enlargement is noted. Other: No abdominal wall hernia or abnormality. No abdominopelvic ascites. Musculoskeletal: No acute or significant osseous findings. IMPRESSION: 1. Mild inflammatory changes are noted involving the pancreatic head, body and tail, consistent with acute pancreatitis. No ductal dilatation or pseudocyst formation is noted. 2. Stable mild prostatic enlargement. 3. Cholelithiasis without evidence of cholecystitis. Aortic Atherosclerosis (ICD10-I70.0). Electronically Signed   By: JMarijo ConceptionM.D.   On: 08/18/2019 16:21    ASSESSMENT AND PLAN: This is a 69years old white male with metastatic non-small cell lung cancer, adenocarcinoma with no actionable mutations and PDL 1 expression of 5% that was initially diagnosed as stage IIIa non-small cell lung cancer, adenocarcinoma status post left lower lobectomy with lymph node dissection followed by a course of concurrent chemoradiation completed in January 2016.  The patient had evidence for disease metastasis in October 2018 with metastatic disease to the left femur as well as left  supraclavicular and right paratracheal lymph nodes. The patient is currently on systemic chemotherapy initially was with carboplatin, Alimta and Keytruda.  Carboplatin was discontinued secondary to hypersensitivity reaction starting from cycle #2.   He was also treated with 3 cycles of maintenance Alimta and Ketruda (pembrolizumab) but Alimta was discontinued secondary to intolerance. He is currently undergoing treatment with maintenance Keytruda as a single agent status post 39 cycles.  The patient continues to tolerate his treatment well with no concerning adverse effects. I recommended for him to proceed with cycle #40 of the maintenance treatment with single agent Keytruda today as planned. I will see the patient back for follow-up visit in 3 weeks for evaluation before the next cycle of his treatment. He will have repeat CT scan of the chest before the next visit. For the hypothyroidism, he will continue his current treatment with levothyroxine. He was advised to call immediately if he has any concerning symptoms in the interval.  The patient voices understanding of current disease status and treatment options and is in agreement with the current care plan. All questions were answered. The patient knows to call the clinic with any problems, questions or concerns. We can certainly see the patient much sooner if necessary.  Disclaimer: This note was dictated with voice recognition software. Similar sounding words can inadvertently be transcribed and may not be corrected upon review.

## 2019-09-17 NOTE — Telephone Encounter (Signed)
Information shared with the patient and the spouse. Confirmed his pharmacy.

## 2019-09-17 NOTE — Patient Instructions (Signed)
[facility] Discharge Instructions for Patients Receiving Chemotherapy  Today you received the following chemotherapy agents:  Keytruda.  To help prevent nausea and vomiting after your treatment, we encourage you to take your nausea medication as directed.   If you develop nausea and vomiting that is not controlled by your nausea medication, call the clinic.   BELOW ARE SYMPTOMS THAT SHOULD BE REPORTED IMMEDIATELY:  *FEVER GREATER THAN 100.5 F  *CHILLS WITH OR WITHOUT FEVER  NAUSEA AND VOMITING THAT IS NOT CONTROLLED WITH YOUR NAUSEA MEDICATION  *UNUSUAL SHORTNESS OF BREATH  *UNUSUAL BRUISING OR BLEEDING  TENDERNESS IN MOUTH AND THROAT WITH OR WITHOUT PRESENCE OF ULCERS  *URINARY PROBLEMS  *BOWEL PROBLEMS  UNUSUAL RASH Items with * indicate a potential emergency and should be followed up as soon as possible.  Feel free to call the clinic should you have any questions or concerns. The clinic phone number is [phone].  Please show the CHEMO ALERT CARD at check-in to the Emergency Department and triage nurse.    

## 2019-09-18 ENCOUNTER — Ambulatory Visit: Payer: Medicare HMO | Admitting: Family Medicine

## 2019-09-22 ENCOUNTER — Ambulatory Visit (HOSPITAL_COMMUNITY)
Admission: RE | Admit: 2019-09-22 | Discharge: 2019-09-22 | Disposition: A | Discharge status: Home / Self Care | Payer: Medicare HMO | Source: Ambulatory Visit | Attending: Nurse Practitioner | Admitting: Nurse Practitioner

## 2019-09-22 ENCOUNTER — Other Ambulatory Visit: Payer: Self-pay

## 2019-09-22 ENCOUNTER — Other Ambulatory Visit: Payer: Self-pay | Admitting: Nurse Practitioner

## 2019-09-22 DIAGNOSIS — K859 Acute pancreatitis without necrosis or infection, unspecified: Secondary | ICD-10-CM

## 2019-09-22 DIAGNOSIS — K8020 Calculus of gallbladder without cholecystitis without obstruction: Secondary | ICD-10-CM | POA: Diagnosis not present

## 2019-09-22 DIAGNOSIS — K8050 Calculus of bile duct without cholangitis or cholecystitis without obstruction: Secondary | ICD-10-CM | POA: Diagnosis not present

## 2019-09-22 DIAGNOSIS — R935 Abnormal findings on diagnostic imaging of other abdominal regions, including retroperitoneum: Secondary | ICD-10-CM | POA: Diagnosis not present

## 2019-09-22 DIAGNOSIS — R1013 Epigastric pain: Secondary | ICD-10-CM | POA: Insufficient documentation

## 2019-09-22 MED ORDER — GADOBUTROL 1 MMOL/ML IV SOLN
10.0000 mL | Freq: Once | INTRAVENOUS | Status: AC | PRN
  Administered 2019-09-22: 10 mL via INTRAVENOUS

## 2019-09-28 ENCOUNTER — Other Ambulatory Visit: Payer: Self-pay

## 2019-09-28 ENCOUNTER — Other Ambulatory Visit: Payer: Medicare HMO

## 2019-09-28 DIAGNOSIS — K859 Acute pancreatitis without necrosis or infection, unspecified: Secondary | ICD-10-CM

## 2019-09-28 DIAGNOSIS — R1013 Epigastric pain: Secondary | ICD-10-CM

## 2019-10-01 LAB — PROTEIN ELECTROPHORESIS, SERUM
A/G Ratio: 1.4 (ref 0.7–1.7)
Albumin ELP: 3.6 g/dL (ref 2.9–4.4)
Alpha 1: 0.3 g/dL (ref 0.0–0.4)
Alpha 2: 0.7 g/dL (ref 0.4–1.0)
Beta: 0.8 g/dL (ref 0.7–1.3)
Gamma Globulin: 0.9 g/dL (ref 0.4–1.8)
Globulin, Total: 2.6 g/dL (ref 2.2–3.9)
Total Protein: 6.2 g/dL (ref 6.0–8.5)

## 2019-10-01 LAB — PANCREATIC ELASTASE, FECAL: Pancreatic Elastase, Fecal: <50 ug Elast./g — ABNORMAL LOW (ref >200)

## 2019-10-01 LAB — IGG 4: IgG, Subclass 4: 37 mg/dL (ref 2–96)

## 2019-10-02 ENCOUNTER — Other Ambulatory Visit: Payer: Self-pay | Admitting: Family Medicine

## 2019-10-02 ENCOUNTER — Other Ambulatory Visit (HOSPITAL_COMMUNITY): Payer: Medicare HMO

## 2019-10-02 ENCOUNTER — Other Ambulatory Visit: Payer: Self-pay | Admitting: Nurse Practitioner

## 2019-10-02 DIAGNOSIS — Z8659 Personal history of other mental and behavioral disorders: Secondary | ICD-10-CM

## 2019-10-02 MED ORDER — PANCRELIPASE (LIP-PROT-AMYL) 36000-114000 UNITS PO CPEP: ORAL_CAPSULE | ORAL | 1 refills | Status: DC

## 2019-10-06 NOTE — Progress Notes (Signed)
[facility] OFFICE PROGRESS NOTE  [name], MD [place] [address]  DIAGNOSIS: Metastatic non-small cell lung cancer initially diagnosed as stage IIIA (T2a, N2, M0) non-small cell lung cancer, poorly differentiated adenocarcinoma presented with left lower lobe lung mass in addition to mediastinal lymphadenopathy. The patient was diagnosed with metastatic disease involving the left femur as well as left supraclavicular nodal metastases and right paratracheal lymphadenopathy in October 2018.  Biomarker Findings Microsatellite Status - MS-Stable Tumor Mutational Burden - TMB-Low (3 Muts/Mb) Genomic Findings For a complete list of the genes assayed, please refer to the Appendix. STK11 P272f*6 [id number] site [id number] E374* MLL2 [id number] NBN K23322f NOTCH2 [id number] splice site [id number]>SDisease relevant genes with no reportable alterations: EGFR, KRAS, ALK, BRAF, MET, RET, ERBB2, ROS1   PDL1 expression5%  PRIOR THERAPY: 1) status post wedge resection of the left lower lobe lung mass as well as AP window lymph node dissection but there was residual metastatic mediastinal lymphadenopathy that could not be resected. 2) a course of concurrent chemoradiation [place] carboplatin [place]  3) status post palliative radiotherapy to the left femur metastatic bone disease. 4) Systemic chemotherapy with carboplatin for AUC of 5, Alimta 500 mg/M2 and Keytruda 200 mg IV every 3 weeks. First dose March 07, 2017. Carboplatin was discontinued during cycle #2 secondary to hypersensitivity reaction. 5) status post 2 cycles of maintenance treatment with Alimta and Ketruda (pembrolizumab). Alimta was discontinued secondary to intolerance  CURRENT THERAPY: Maintenance treatment with single agent Keytruda (pembrolizumab) status post71cycles  INTERVAL HISTORY: MicDASHIEL BERGQUIST1 75o. male returns to the clinic today for a follow up visit. The patient had an episode of acute pancreatitis in May 2021. He is currently being followed by GI. He recently had an MRI/MRCP. Unclear etiology of his pancreatitis and cannot exclude immunotherapy.   Today, the patient reports that his abdominal pain has resolved. The main concern from his pancreatis is changes in his color and caliber of stools. His stools are lighter in color and softer and occasionally loose. He lost about 6 lbs since his last appointment. He is having a hard time digesting fatty foods and has been eating a lot of soup. He is unable to afford a medication that GI prescribed, possibly the creon.  Denies any fevers, chills, or night sweats. He reports his baseline dyspnea on exertion and dry cough but denies any chest pain or hemoptysis. He has COPD. He is followed by pulmonology. He denies any nausea, vomiting, or constipation. He denies any headache or visual changes. He denies any rashes or skin changes. He recently had a restaging CT scan performed.He is here today for evaluation before starting cycle #40  MEDICAL HISTORY: Past Medical History:  Diagnosis Date  . Adenocarcinoma of left lung, stage 3 ([facility]/16/2018  . Arthritis   . Atrial fibrillation ([facility]/16/2018  . Atrial fibrillation ([facility] . Cancer, metastatic to bone ([facility] . COPD (chronic obstructive pulmonary disease) ([facility]/16/2018  . History of chemotherapy   . History of radiation therapy   . Hyperlipidemia   . Hypothyroid 08/15/2016  . Longstanding persistent atrial fibrillation ([facility] [place]/30/2018  . Pathologic fracture    left femur  . Pneumonitis   . S/P TURP 08/15/2016  . Wears glasses   . Wears hearing aid in both ears     ALLERGIES:  is allergic to carboplatin, flecainide, and debrox [  carbamide peroxide].  MEDICATIONS:  Current Outpatient Medications  Medication Sig Dispense Refill  . albuterol (VENTOLIN HFA) 108 (90 Base)  MCG/ACT inhaler Inhale 1-2 puffs into the lungs every 4 (four) hours as needed for wheezing or shortness of breath. 3 month supply 24 g 3  . apixaban (ELIQUIS) 5 MG TABS tablet Take 1 tablet (5 mg total) by mouth 2 (two) times daily. 180 tablet 1  . atenolol (TENORMIN) 50 MG tablet TAKE 1 TABLET (50 MG TOTAL) BY MOUTH DAILY. 90 tablet 3  . atorvastatin (LIPITOR) 40 MG tablet Take 1 tablet (40 mg total) by mouth daily. 90 tablet 2  . citalopram (CELEXA) 40 MG tablet TAKE 1 TABLET EVERY DAY 90 tablet 1  . esomeprazole (NEXIUM) 40 MG capsule Take 1 capsule (40 mg total) by mouth 2 (two) times daily before a meal. 60 capsule 1  . famotidine (PEPCID) 40 MG tablet Take 1 tablet (40 mg total) by mouth daily. 30 tablet 0  . ibuprofen (ADVIL,MOTRIN) 400 MG tablet Take 400 mg by mouth every 4 (four) hours as needed for moderate pain.    Marland Kitchen levothyroxine (SYNTHROID) 150 MCG tablet Take 150 mcg by mouth daily before breakfast.    . lipase/protease/amylase (CREON) 36000 UNITS CPEP capsule Take 2 capsules (72,000 Units total) by mouth 3 (three) times daily with meals. May also take 1 capsule (36,000 Units total) as needed (with snacks). 240 capsule 1  . Oxycodone HCl 10 MG TABS Take one daily as needed for severe back pain. 30 tablet 0  . polyethylene glycol powder (GLYCOLAX/MIRALAX) 17 GM/SCOOP powder Take 17 g by mouth 2 (two) times daily as needed. 3350 g 1  . tamsulosin (FLOMAX) 0.4 MG CAPS capsule Take 1 capsule (0.4 mg total) by mouth daily. 90 capsule 4   No current facility-administered medications for this visit.   Facility-Administered Medications Ordered in Other Visits  Medication Dose Route Frequency Provider Last Rate Last Admin  . heparin lock flush 100 unit/mL  500 Units Intracatheter Once PRN Curt Bears, MD      . pembrolizumab [facility]) 200 mg in sodium chloride 0.9 % 50 mL chemo infusion  200 mg Intravenous Once Curt Bears, MD 116 mL/hr at 10/08/19 1600 200 mg at 10/08/19 1600  .  sodium chloride flush (NS) 0.9 % injection 10 mL  10 mL Intracatheter PRN Curt Bears, MD        SURGICAL HISTORY:  Past Surgical History:  Procedure Laterality Date  . BRONCHOSCOPY  10/2014  . CARDIAC CATHETERIZATION     05/07/12  . CARDIOVERSION     x2  . COLONOSCOPY    . DG BIOPSY LUNG Left 10/2014   FNA - Adenocarcinoma   . FEMUR IM NAIL Left 02/19/2017  . FEMUR IM NAIL Left 02/19/2017   Procedure: INTRAMEDULLARY (IM) NAIL FEMORAL;  Surgeon: Marchia Bond, MD;  Location: [facility];  Service: Orthopedics;  Laterality: Left;  . IR FLUORO GUIDE PORT INSERTION RIGHT  07/03/2017  . IR US GUIDE VASC ACCESS RIGHT  07/03/2017  . LUNG CANCER SURGERY Left 12/2014   Wedge Resection   . MULTIPLE TOOTH EXTRACTIONS    . Status post TURP    . TONSILLECTOMY      REVIEW OF SYSTEMS:   Review of Systems  Constitutional: Positive for appetite change, weight loss, and fatigue. Negative for chills and fever. HENT: Negative for mouth sores, nosebleeds, sore throat and trouble swallowing.   Eyes: Negative for eye problems and icterus.  Respiratory: Positive  for dyspnea on exertion and chronic cough. Negative for hemoptysis and wheezing.  Cardiovascular: Negative for chest pain and leg swelling.  Gastrointestinal: Positive for lighter stools and softer stools. Negative for abdominal pain, constipation, nausea and vomiting.  Genitourinary: Negative for bladder incontinence, difficulty urinating, dysuria, frequency and hematuria.   Musculoskeletal: Negative for back pain, gait problem, neck pain and neck stiffness.  Skin: Negative for itching and rash.  Neurological: Negative for dizziness, extremity weakness, gait problem, headaches, light-headedness and seizures.  Hematological: Negative for adenopathy. Does not bruise/bleed easily.  Psychiatric/Behavioral: Negative for confusion, depression and sleep disturbance. The patient is not nervous/anxious.      PHYSICAL EXAMINATION:  Blood pressure  105/67, pulse 62, temperature (!) 97.3 F (36.3 C), temperature source Temporal, resp. rate 16, height 6' 2"  (1.88 m), weight 232 lb 3.2 oz (105.3 kg), SpO2 94 %.  ECOG PERFORMANCE STATUS: 1 - Symptomatic but completely ambulatory  Physical Exam  Constitutional: Oriented to person, place, and time and well-developed, well-nourished, and in no distress.  HENT:  Head: Normocephalic and atraumatic.  Mouth/Throat: Oropharynx is clear and moist. No oropharyngeal exudate.  Eyes: Conjunctivae are normal. Right eye exhibits no discharge. Left eye exhibits no discharge. No scleral icterus.  Neck: Normal range of motion. Neck supple.  Cardiovascular: Normal rate, irregular rhythm, normal heart sounds and intact distal pulses.   Pulmonary/Chest: Effort normal and breath sounds normal. No respiratory distress. No wheezes. No rales.  Abdominal: Soft. Bowel sounds are normal. Exhibits no distension and no mass. There is no tenderness.  Musculoskeletal: Normal range of motion. Exhibits no edema.  Lymphadenopathy:    No cervical adenopathy.  Neurological: Alert and oriented to person, place, and time. Exhibits normal muscle tone. Gait normal. Coordination normal.  Skin: Skin is warm and dry. No rash noted. Not diaphoretic. No erythema. No pallor.  Psychiatric: Mood, memory and judgment normal.  Vitals reviewed.  LABORATORY DATA: Lab Results  Component Value Date   WBC 5.9 10/08/2019   HGB 13.7 10/08/2019   HCT 39.8 10/08/2019   MCV 94.8 10/08/2019   PLT 149 (L) 10/08/2019      Chemistry      Component Value Date/Time   NA 140 10/08/2019 1341   NA 136 04/04/2017 1141   K 4.3 10/08/2019 1341   K 4.4 04/04/2017 1141   CL 106 10/08/2019 1341   CO2 24 10/08/2019 1341   CO2 24 04/04/2017 1141   BUN 7 (L) 10/08/2019 1341   BUN 21.1 04/04/2017 1141   CREATININE 0.95 10/08/2019 1341   CREATININE 1.1 04/04/2017 1141      Component Value Date/Time   CALCIUM 8.8 (L) 10/08/2019 1341   CALCIUM  9.1 04/04/2017 1141   ALKPHOS 105 10/08/2019 1341   ALKPHOS 89 04/04/2017 1141   AST 18 10/08/2019 1341   AST 17 04/04/2017 1141   ALT 18 10/08/2019 1341   ALT 18 04/04/2017 1141   BILITOT 0.7 10/08/2019 1341   BILITOT 0.67 04/04/2017 1141       RADIOGRAPHIC STUDIES:  CT Chest W Contrast  Result Date: 10/07/2019 CLINICAL DATA:  Non-small cell lung cancer staging. EXAM: CT CHEST WITH CONTRAST TECHNIQUE: Multidetector CT imaging of the chest was performed during intravenous contrast administration. CONTRAST:  67m OMNIPAQUE IOHEXOL 300 MG/ML  SOLN COMPARISON:  June 15, 2019 FINDINGS: Cardiovascular: Calcified and noncalcified atheromatous plaque in the thoracic aorta. Aortic caliber is normal. Central pulmonary vasculature mildly engorged without additional abnormality on venous phase. Heart size stable with small  pericardial effusion. Calcified coronary artery disease similar to the prior study. Mediastinum/Nodes: RIGHT sided Port-A-Cath remains in place terminating in the upper RIGHT atrium. Chronic narrowing of the RIGHT break cephalic vein about the Port-A-Cath. No thoracic inlet adenopathy.  No axillary lymphadenopathy. RIGHT paratracheal lymph node (image 47, series 2) 12-13 mm short axis similar to the prior study. Small lymph nodes posterior to the trachea adjacent esophagus just below thoracic inlet are also unchanged. Low RIGHT paratracheal lymph node (image 66, series 2) 15 mm short axis unchanged. Subcarinal nodal enlargement (image 87, series 2) 16 mm short axis unchanged compared to prior study. Mild fullness of RIGHT hilar nodal tissue without frank enlargement unchanged from the prior study. Distortion of LEFT hilum in the setting of treated disease in the LEFT chest with similar appearance. Lungs/Pleura: Emphysema and signs of septal thickening. LEFT basilar consolidative/post treatment changes extending from the LEFT hilum into the superior segment of the LEFT lower lobe with volume  loss and bronchiectasis are similar to the prior study. Small nodule in the LEFT chest (image 51, series 7) 6 mm. Amidst an area of septal thickening in the LEFT upper lobe, not seen on previous imaging. Small amount of pleural fluid and/or pleural thickening is stable. In the RIGHT chest anterior to the minor fissure (image 105, series 7) 2.5 x 2.1 cm area of nodularity, part solid features. More solid component measuring approximately 7 mm, not present on the previous study. Mild fissural thickening adjacent to this area. Upper Abdomen: Cholelithiasis. Mild peripancreatic stranding. No ductal dilation. Peripancreatic stranding is perhaps slightly increased compared to more recent CT of the abdomen and pelvis from May 18th, similar accounting for differences in technique when compared to the study of September 22, 2019. Musculoskeletal: No acute or destructive bone process. Post LEFT thoracotomy. Spinal degenerative changes. IMPRESSION: 1. In the RIGHT chest anterior to the minor fissure there is a 2.5 x 2.1 cm area of nodularity, part solid features. More solid component measuring approximately 7 mm, not present on the previous study. In the setting of interstitial lung disease while findings are concerning and new since abdominal CT as well of Aug 18, 2019. Given development in the short interval, inflammatory process is considered. Consider discussion in multi disciplinary thoracic oncology setting with short interval follow-up with CT or PET scan as warranted. 2. Small nodule associated with areas of septal thickening in the LEFT upper lobe as discussed approximately 6 mm. This could be assessed on follow-up as well. 3. Stable appearance of LEFT basilar consolidative/post treatment changes extending from the LEFT hilum into the superior segment of the LEFT lower lobe with volume loss and bronchiectasis. 4. Stable mediastinal adenopathy. 5. Cholelithiasis. 6. Mild peripancreatic stranding is perhaps slightly  increased compared to more recent CT of the abdomen and pelvis from May 18th, similar accounting for differences in technique when compared to the study of September 22, 2019. Correlate with any continued symptoms. Again based on morphology of the pancreas IgG 4 related disease/autoimmune pancreatitis is considered. 7. Emphysema and aortic atherosclerosis. Aortic Atherosclerosis (ICD10-I70.0) and Emphysema (ICD10-J43.9). Electronically Signed   By: Zetta Bills M.D.   On: 10/07/2019 09:13   MR 3D Recon At Scanner  Result Date: 09/23/2019 CLINICAL DATA:  Epigastric abdominal pain.  Acute pancreatitis. EXAM: MRI ABDOMEN WITHOUT AND WITH CONTRAST (INCLUDING MRCP) TECHNIQUE: Multiplanar multisequence MR imaging of the abdomen was performed both before and after the administration of intravenous contrast. Heavily T2-weighted images of the biliary and pancreatic ducts  were obtained, and three-dimensional MRCP images were rendered by post processing. CONTRAST:  69m GADAVIST GADOBUTROL 1 MMOL/ML IV SOLN COMPARISON:  08/18/2019 abdominopelvic CT. FINDINGS: Lower chest: Mild cardiomegaly. Trace left pleural fluid is similar to slightly increased compared to the prior CT. Hepatobiliary: High left hepatic lobe cyst. No suspicious liver lesion. Multiple tiny gallstones, maximally 7 mm. No evidence of acute cholecystitis. No biliary duct dilatation or choledocholithiasis. Pancreas: The pancreas is mildly, diffusely edematous with similar mild surrounding peripancreatic edema. Demonstrates increased signal on diffusion-weighted imaging including series 5. The pancreatic duct is not well delineated. No duct dilatation seen. Diffusely decreased early post-contrast enhancement with delayed enhancement including on series 29. A pancreatic head/uncinate process cystic lesion measures 9 mm on 24/3 and is without postcontrast enhancement. Spleen:  Normal in size, without focal abnormality. Adrenals/Urinary Tract: Normal adrenal glands.  Tiny bilateral renal lesions are likely cysts. No hydronephrosis. Stomach/Bowel: Normal stomach and abdominal bowel loops. Vascular/Lymphatic: Aortic atherosclerosis. Patent portal and splenic veins. Upper normal gastrohepatic ligament node at 9 mm on 33/29, similar. Other:  No ascites. Musculoskeletal: No acute osseous abnormality. IMPRESSION: 1. Peripancreatic and pancreatic edema, most consistent with pancreatitis. Given imaging characteristics, including delayed pancreatic enhancement and relatively mild peripancreatic edema, recommend clinical exclusion of autoimmune pancreatitis. 2. Cystic lesion within the pancreatic head/uncinate process of 9 mm is most likely a pseudocyst. Indolent cystic neoplasm could look similar. Per consensus criteria, this warrants follow-up with pre and post contrast abdominal MRI/MRCP at 1 year. This recommendation follows ACR consensus guidelines: Management of Incidental Pancreatic Cysts: A White Paper of the ACR Incidental Findings Committee. [id number];[id number] 3. Cholelithiasis without acute cholecystitis. 4.  Aortic Atherosclerosis (ICD10-I70.0). 5. Small left pleural effusion. Electronically Signed   By: KAbigail MiyamotoM.D.   On: 09/23/2019 12:35   MR ABDOMEN MRCP W WO CONTAST  Result Date: 09/23/2019 CLINICAL DATA:  Epigastric abdominal pain.  Acute pancreatitis. EXAM: MRI ABDOMEN WITHOUT AND WITH CONTRAST (INCLUDING MRCP) TECHNIQUE: Multiplanar multisequence MR imaging of the abdomen was performed both before and after the administration of intravenous contrast. Heavily T2-weighted images of the biliary and pancreatic ducts were obtained, and three-dimensional MRCP images were rendered by post processing. CONTRAST:  119mGADAVIST GADOBUTROL 1 MMOL/ML IV SOLN COMPARISON:  08/18/2019 abdominopelvic CT. FINDINGS: Lower chest: Mild cardiomegaly. Trace left pleural fluid is similar to slightly increased compared to the prior CT. Hepatobiliary: High left hepatic  lobe cyst. No suspicious liver lesion. Multiple tiny gallstones, maximally 7 mm. No evidence of acute cholecystitis. No biliary duct dilatation or choledocholithiasis. Pancreas: The pancreas is mildly, diffusely edematous with similar mild surrounding peripancreatic edema. Demonstrates increased signal on diffusion-weighted imaging including series 5. The pancreatic duct is not well delineated. No duct dilatation seen. Diffusely decreased early post-contrast enhancement with delayed enhancement including on series 29. A pancreatic head/uncinate process cystic lesion measures 9 mm on 24/3 and is without postcontrast enhancement. Spleen:  Normal in size, without focal abnormality. Adrenals/Urinary Tract: Normal adrenal glands. Tiny bilateral renal lesions are likely cysts. No hydronephrosis. Stomach/Bowel: Normal stomach and abdominal bowel loops. Vascular/Lymphatic: Aortic atherosclerosis. Patent portal and splenic veins. Upper normal gastrohepatic ligament node at 9 mm on 33/29, similar. Other:  No ascites. Musculoskeletal: No acute osseous abnormality. IMPRESSION: 1. Peripancreatic and pancreatic edema, most consistent with pancreatitis. Given imaging characteristics, including delayed pancreatic enhancement and relatively mild peripancreatic edema, recommend clinical exclusion of autoimmune pancreatitis. 2. Cystic lesion within the pancreatic head/uncinate process of 9 mm is most likely  a pseudocyst. Indolent cystic neoplasm could look similar. Per consensus criteria, this warrants follow-up with pre and post contrast abdominal MRI/MRCP at 1 year. This recommendation follows ACR consensus guidelines: Management of Incidental Pancreatic Cysts: A White Paper of the ACR Incidental Findings Committee. [place] [id number];48:[id number]. 3. Cholelithiasis without acute cholecystitis. 4.  Aortic Atherosclerosis (ICD10-I70.0). 5. Small left pleural effusion. Electronically Signed   By: Abigail Miyamoto M.D.   On: 09/23/2019  12:35     ASSESSMENT/PLAN:  This is a very pleasant 69 year old Caucasian male with metastatic non-small cell lung cancer, adenocarcinoma. He was initially diagnosed as astage IIIa. He presented with a left lower lobe lung mass in addition to mediastinal lymphadenopathy. He was diagnosed in January 2016. He is status post left lower lobectomy with lymph node dissection followed by concurrent chemoradiation. His PDL 1 expression is 5%.  He was diagnosed with metastatic disease involving the leftfemuras well as a left supraclavicular nodalmetastasis and right paratracheal lymphadenopathy in October 2018.  Hewasstarted on systemic chemotherapy with carboplatin, Alimta, and Keytruda. Carboplatin was discontinued after cycle #2 due to a hypersensitivity reaction. Alimta was also discontinued after cycle #4 due to intolerance. The patient has been on maintenance treatment with single agent Keytruda for40cycles.  The patient was seen with Dr. Julien Nordmann today. The patient recently had a restaging CT scan of the chest.  Dr. Julien Nordmann personally and independently reviewed the scan and discussed the results with the patient today.  The scan showed stable disease except for an area of  of nodularity, part solid features in the right lung which warrants close monitoring. This could represent an inflammatory process considering the short interval of its development.   Regarding his treatment with immunotherapy with Keytruda and his recent episode of pancreatitis, Dr. Julien Nordmann discussed that the etiology of his pancreatitis is unclear; however, we cannot exclude immunotherapy mediated.   The patient was given the option of continuing to hold his treatment and continue on observation with repeat imaging in a few weeks vs. continuing on immunotherapy with Keytruda. The patient has chosen to continue his treatment with Keytruda.   He will proceed with cycle #41 today as scheduled.   We will see him  back for a follow up visit in 3 weeks for evaluation before starting cycle #42.   Of course, he was advised to call us immediately or seek medical evaluation if he develops new or concerning symptoms in the interval.   He will continue to follow with GI as well for his pancreatitis.    The patient was advised to call immediately if he has any concerning symptoms in the interval. The patient voices understanding of current disease status and treatment options and is in agreement with the current care plan. All questions were answered. The patient knows to call the clinic with any problems, questions or concerns. We can certainly see the patient much sooner if necessary.  No orders of the defined types were placed in this encounter.    Wister Hoefle L Nikaela Coyne, PA-C 10/08/19  ADDENDUM: Hematology/Oncology Attending: I had a face-to-face encounter with the patient today.  I recommended his care plan.  This is a very pleasant 69 years old white male with metastatic non-small cell lung cancer, adenocarcinoma status post induction treatment with carboplatin, Alimta and Keytruda.  His chemotherapy was discontinued secondary to intolerance and the patient has been on single agent Keytruda status post 40 cycles.  Has been tolerating this treatment well with no concerning adverse effects.  He was recently diagnosed with pancreatitis presenting mainly with diarrhea and initially with abdominal pain that completely resolved.  He continues to have few episodes of diarrhea every now and then.  The patient had repeat CT scan of the chest performed recently.  I personally and independently reviewed the scans and discussed the results with the patient and showed him the images.  His scan showed a stable disease except for semisolid opacity in the right chest chest anterior to the minor fissure.  This could be inflammatory process especially with the development in short period but underlying malignancy could not  be excluded. I had a lengthy discussion with the patient today about his current condition and treatment options.  I discussed with the patient several options for management of his condition including continuous treatment with single agent Keytruda and close monitoring of the new opacity in the right chest versus holding his current treatment with [facility] and close monitoring because of the recent pancreatitis.  The patient is not interested in holding his treatment with Keytruda at this point since he did very well over the last few years.  He would like to continue his current treatment with Beryle Flock despite the fact that it may be contributing to his recent pancreatitis. We will proceed with his treatment today as planned.  We will continue to monitor him closely and will consider him for repeat scan in around 9 weeks. The patient was also advised to call immediately if he has any concerning symptoms in the interval. Disclaimer: This note was dictated with voice recognition software. Similar sounding words can inadvertently be transcribed and may be missed upon review. Eilleen Kempf, MD 10/08/19

## 2019-10-07 ENCOUNTER — Ambulatory Visit (HOSPITAL_COMMUNITY)
Admission: RE | Admit: 2019-10-07 | Discharge: 2019-10-07 | Disposition: A | Discharge status: Home / Self Care | Payer: Medicare HMO | Source: Ambulatory Visit | Attending: Internal Medicine | Admitting: Internal Medicine

## 2019-10-07 ENCOUNTER — Ambulatory Visit: Payer: Medicare HMO | Admitting: Internal Medicine

## 2019-10-07 ENCOUNTER — Other Ambulatory Visit: Payer: Medicare HMO

## 2019-10-07 ENCOUNTER — Encounter (HOSPITAL_COMMUNITY): Payer: Self-pay

## 2019-10-07 ENCOUNTER — Other Ambulatory Visit: Payer: Self-pay

## 2019-10-07 ENCOUNTER — Ambulatory Visit: Payer: Medicare HMO

## 2019-10-07 DIAGNOSIS — J8489 Other specified interstitial pulmonary diseases: Secondary | ICD-10-CM | POA: Diagnosis not present

## 2019-10-07 DIAGNOSIS — I700 Atherosclerosis of aorta: Secondary | ICD-10-CM | POA: Diagnosis not present

## 2019-10-07 DIAGNOSIS — J479 Bronchiectasis, uncomplicated: Secondary | ICD-10-CM | POA: Diagnosis not present

## 2019-10-07 DIAGNOSIS — C3490 Malignant neoplasm of unspecified part of unspecified bronchus or lung: Secondary | ICD-10-CM | POA: Diagnosis not present

## 2019-10-07 DIAGNOSIS — J439 Emphysema, unspecified: Secondary | ICD-10-CM | POA: Diagnosis not present

## 2019-10-07 MED ORDER — HEPARIN SOD (PORK) LOCK FLUSH 100 UNIT/ML IV SOLN
INTRAVENOUS | Status: AC
  Filled 2019-10-07: qty 5

## 2019-10-07 MED ORDER — IOHEXOL 300 MG/ML  SOLN
75.0000 mL | Freq: Once | INTRAMUSCULAR | Status: AC | PRN
  Administered 2019-10-07: 75 mL via INTRAVENOUS

## 2019-10-07 MED ORDER — SODIUM CHLORIDE (PF) 0.9 % IJ SOLN
INTRAMUSCULAR | Status: AC
  Filled 2019-10-07: qty 50

## 2019-10-07 MED ORDER — HEPARIN SOD (PORK) LOCK FLUSH 100 UNIT/ML IV SOLN
500.0000 [IU] | Freq: Once | INTRAVENOUS | Status: AC
  Administered 2019-10-07: 500 [IU] via INTRAVENOUS

## 2019-10-08 ENCOUNTER — Inpatient Hospital Stay: Payer: Medicare HMO

## 2019-10-08 ENCOUNTER — Encounter: Payer: Self-pay | Admitting: Physician Assistant

## 2019-10-08 ENCOUNTER — Inpatient Hospital Stay (HOSPITAL_BASED_OUTPATIENT_CLINIC_OR_DEPARTMENT_OTHER): Payer: Medicare HMO | Admitting: Physician Assistant

## 2019-10-08 ENCOUNTER — Inpatient Hospital Stay: Payer: Medicare HMO | Attending: Internal Medicine

## 2019-10-08 ENCOUNTER — Other Ambulatory Visit: Payer: Self-pay

## 2019-10-08 VITALS — BP 105/67 | HR 62 | Temp 97.3°F | Resp 16 | Ht 74.0 in | Wt 232.2 lb

## 2019-10-08 DIAGNOSIS — Z9221 Personal history of antineoplastic chemotherapy: Secondary | ICD-10-CM | POA: Insufficient documentation

## 2019-10-08 DIAGNOSIS — C3492 Malignant neoplasm of unspecified part of left bronchus or lung: Secondary | ICD-10-CM

## 2019-10-08 DIAGNOSIS — C3432 Malignant neoplasm of lower lobe, left bronchus or lung: Secondary | ICD-10-CM | POA: Insufficient documentation

## 2019-10-08 DIAGNOSIS — Z5112 Encounter for antineoplastic immunotherapy: Secondary | ICD-10-CM | POA: Diagnosis not present

## 2019-10-08 DIAGNOSIS — Z79899 Other long term (current) drug therapy: Secondary | ICD-10-CM | POA: Diagnosis not present

## 2019-10-08 DIAGNOSIS — C7951 Secondary malignant neoplasm of bone: Secondary | ICD-10-CM

## 2019-10-08 DIAGNOSIS — Z902 Acquired absence of lung [part of]: Secondary | ICD-10-CM | POA: Diagnosis not present

## 2019-10-08 DIAGNOSIS — R590 Localized enlarged lymph nodes: Secondary | ICD-10-CM | POA: Insufficient documentation

## 2019-10-08 DIAGNOSIS — Z923 Personal history of irradiation: Secondary | ICD-10-CM | POA: Diagnosis not present

## 2019-10-08 DIAGNOSIS — Z95828 Presence of other vascular implants and grafts: Secondary | ICD-10-CM

## 2019-10-08 LAB — CBC WITH DIFFERENTIAL (CANCER CENTER ONLY)
Abs Immature Granulocytes: 0.01 10*3/uL (ref 0.00–0.07)
Basophils Absolute: 0 10*3/uL (ref 0.0–0.1)
Basophils Relative: 0 %
Eosinophils Absolute: 0.1 10*3/uL (ref 0.0–0.5)
Eosinophils Relative: 2 %
HCT: 39.8 % (ref 39.0–52.0)
Hemoglobin: 13.7 g/dL (ref 13.0–17.0)
Immature Granulocytes: 0 %
Lymphocytes Relative: 26 %
Lymphs Abs: 1.5 10*3/uL (ref 0.7–4.0)
MCH: 32.6 pg (ref 26.0–34.0)
MCHC: 34.4 g/dL (ref 30.0–36.0)
MCV: 94.8 fL (ref 80.0–100.0)
Monocytes Absolute: 0.5 10*3/uL (ref 0.1–1.0)
Monocytes Relative: 9 %
Neutro Abs: 3.7 10*3/uL (ref 1.7–7.7)
Neutrophils Relative %: 63 %
Platelet Count: 149 10*3/uL — ABNORMAL LOW (ref 150–400)
RBC: 4.2 MIL/uL — ABNORMAL LOW (ref 4.22–5.81)
RDW: 13.9 % (ref 11.5–15.5)
WBC Count: 5.9 10*3/uL (ref 4.0–10.5)
nRBC: 0 % (ref 0.0–0.2)

## 2019-10-08 LAB — CMP (CANCER CENTER ONLY)
ALT: 18 U/L (ref 0–44)
AST: 18 U/L (ref 15–41)
Albumin: 3.3 g/dL — ABNORMAL LOW (ref 3.5–5.0)
Alkaline Phosphatase: 105 U/L (ref 38–126)
Anion gap: 10 (ref 5–15)
BUN: 7 mg/dL — ABNORMAL LOW (ref 8–23)
CO2: 24 mmol/L (ref 22–32)
Calcium: 8.8 mg/dL — ABNORMAL LOW (ref 8.9–10.3)
Chloride: 106 mmol/L (ref 98–111)
Creatinine: 0.95 mg/dL (ref 0.61–1.24)
GFR, Est AFR Am: >60 mL/min (ref >60)
GFR, Estimated: >60 mL/min (ref >60)
Glucose, Bld: 132 mg/dL — ABNORMAL HIGH (ref 70–99)
Potassium: 4.3 mmol/L (ref 3.5–5.1)
Sodium: 140 mmol/L (ref 135–145)
Total Bilirubin: 0.7 mg/dL (ref 0.3–1.2)
Total Protein: 5.8 g/dL — ABNORMAL LOW (ref 6.5–8.1)

## 2019-10-08 LAB — TSH: TSH: 6.244 u[IU]/mL — ABNORMAL HIGH (ref 0.320–4.118)

## 2019-10-08 MED ORDER — SODIUM CHLORIDE 0.9% FLUSH
10.0000 mL | INTRAVENOUS | Status: DC | PRN
  Administered 2019-10-08: 10 mL
  Filled 2019-10-08: qty 10

## 2019-10-08 MED ORDER — SODIUM CHLORIDE 0.9 % IV SOLN
Freq: Once | INTRAVENOUS | Status: AC
  Filled 2019-10-08: qty 250

## 2019-10-08 MED ORDER — SODIUM CHLORIDE 0.9 % IV SOLN
200.0000 mg | Freq: Once | INTRAVENOUS | Status: AC
  Administered 2019-10-08: 200 mg via INTRAVENOUS
  Filled 2019-10-08: qty 8

## 2019-10-08 MED ORDER — HEPARIN SOD (PORK) LOCK FLUSH 100 UNIT/ML IV SOLN
500.0000 [IU] | Freq: Once | INTRAVENOUS | Status: AC | PRN
  Administered 2019-10-08: 500 [IU]
  Filled 2019-10-08: qty 5

## 2019-10-08 NOTE — Progress Notes (Signed)
10/08/19  Proceed with Keytruda today.  T.O. Cassandra Heilingoetter,PA/Ehtan Delfavero Ronnald Ramp, PharmD

## 2019-10-08 NOTE — Patient Instructions (Signed)
[facility] Discharge Instructions for Patients Receiving Chemotherapy  Today you received the following chemotherapy agents:  Keytruda.  To help prevent nausea and vomiting after your treatment, we encourage you to take your nausea medication as directed.   If you develop nausea and vomiting that is not controlled by your nausea medication, call the clinic.   BELOW ARE SYMPTOMS THAT SHOULD BE REPORTED IMMEDIATELY:  *FEVER GREATER THAN 100.5 F  *CHILLS WITH OR WITHOUT FEVER  NAUSEA AND VOMITING THAT IS NOT CONTROLLED WITH YOUR NAUSEA MEDICATION  *UNUSUAL SHORTNESS OF BREATH  *UNUSUAL BRUISING OR BLEEDING  TENDERNESS IN MOUTH AND THROAT WITH OR WITHOUT PRESENCE OF ULCERS  *URINARY PROBLEMS  *BOWEL PROBLEMS  UNUSUAL RASH Items with * indicate a potential emergency and should be followed up as soon as possible.  Feel free to call the clinic should you have any questions or concerns. The clinic phone number is [phone].  Please show the CHEMO ALERT CARD at check-in to the Emergency Department and triage nurse.    

## 2019-10-13 ENCOUNTER — Telehealth: Payer: Self-pay

## 2019-10-13 ENCOUNTER — Other Ambulatory Visit: Payer: Self-pay

## 2019-10-13 DIAGNOSIS — R1013 Epigastric pain: Secondary | ICD-10-CM

## 2019-10-13 DIAGNOSIS — K859 Acute pancreatitis without necrosis or infection, unspecified: Secondary | ICD-10-CM

## 2019-10-13 NOTE — Telephone Encounter (Signed)
I tried to send it using the P first and was unable it was giving me an invalid code so I had to search through a list to find you. Sorry about that. Not sure what is wrong and why its not letting us find it that way.  2 other nurses tried as well.. I will have my manager take a look at it too.

## 2019-10-13 NOTE — Telephone Encounter (Signed)
See phone note 7/13

## 2019-10-13 NOTE — Telephone Encounter (Signed)
[facility] Pre-operative Risk Assessment     Request for surgical clearance:     Endoscopy Procedure  What type of surgery is being performed?     EUS  When is this surgery scheduled?     12/21/19  What type of clearance is required ?   Pharmacy  Are there any medications that need to be held prior to surgery and how long? Eliquis  Practice name and name of physician performing surgery?      [facility]  What is your office phone and fax number?      Phone- [phone]  [phone]  Anesthesia type (None, local, MAC, general) ?       MAC

## 2019-10-13 NOTE — Telephone Encounter (Signed)
   Primary Cardiologist: Skeet Latch, MD  Chart reviewed as part of pre-operative protocol coverage.   Per pharmacy recommendations, patient can hold eliquis 1-2 day prior to his upcoming EUS with plans to restart as soon as he is cleared to do so by his gastroenterologist.  I will route this recommendation to the requesting party via [facility] fax function and remove from pre-op pool.  Please call with questions.  Abigail Butts, PA-C 10/13/2019, 11:52 AM

## 2019-10-13 NOTE — Telephone Encounter (Signed)
Patient with diagnosis of afib on Eliquis for anticoagulation.    Procedure: EUS Date of procedure: 12/21/19  CHADS2-VASc score of  1 (AGE)  CrCl 82 ml/min  Per office protocol, patient can hold Eliquis for 1-2 day prior to procedure.

## 2019-10-13 NOTE — Telephone Encounter (Signed)
Was I supposed to do something with this ?

## 2019-10-14 NOTE — Telephone Encounter (Signed)
Patients wife is returning your call

## 2019-10-14 NOTE — Telephone Encounter (Signed)
EUS scheduled, pt instructed and medications reviewed.  Patient instructions available in My Chart; confirmed the pt can view the information.  Patient to call with any questions or concerns.

## 2019-10-14 NOTE — Telephone Encounter (Signed)
JAICION LAURIE   DOB: 08/22/1950 MRN: [id number] Procedure Date: 12/21/19 Arrival Time: 6 am Procedure Time: 188 am   Location of Procedure: [facility]   Due to recent COVID-19 restrictions implemented by our local and state authorities and in an effort to keep both patients and staff as safe as possible, our hospital system now requires COVID-19 testing prior to any scheduled hospital procedure. Please go to [place], [place], [place] [address] on 12/17/19 at  955 am. This is a drive up testing site, you will not need to exit your vehicle.  You will not be billed at the time of testing but may receive a bill later depending on your insurance. The approximate cost of the test is $100. You must agree to quarantine from the time of your testing until the procedure date on 12/21/19 . This should include staying at home with ONLY the people you live with. Avoid take-out, grocery store shopping or leaving the house for any non-emergent reason. Failure to have your COVID-19 test done on the date and time you have been scheduled will result in cancellation of procedure. Please call our office at [phone] if you have any questions.   PREPARATION FOR ENDOSCOPY  On 12/21/19 THE DAY OF THE PROCEDURE:  1. No solid foods, milk or milk products are allowed after midnight the night before your procedure.  2. Do not drink anything colored red or purple. Avoid juices with pulp. No orange juice.  3. You may drink clear liquids until 330 am, which is 4 hours before your procedure.  CLEAR LIQUIDS INCLUDE: Water Jello  Ice Popsicles  Tea (sugar ok, no milk/cream) Powdered fruit flavored drinks  Coffee (sugar ok, no milk/cream) Gatorade  Juice: apple, white grape, white cranberry Lemonade  Clear bullion, consomme, broth Carbonated beverages (any kind)  Strained chicken noodle soup Hard Candy    MEDICATION INSTRUCTIONS  Unless otherwise instructed, you should take regular prescription  medications with a small sip of water as early as possible the morning of your procedure.  You will be contacted by our office prior to your procedure for directions on holding your Eliquis.  If you do not hear from our office 1 week prior to your scheduled procedure, please call [phone] to discuss.    OTHER INSTRUCTIONS  You will need a responsible adult at least 71 years of age to accompany you and drive you home. This person must remain in the waiting room during your procedure.  Wear loose fitting clothing that is easily removed.  Leave jewelry and other valuables at home. However, you may wish to bring a book to read or an iPod/MP3 player to listen to music as you wait for your procedure to start.  Remove all body piercing jewelry and leave at home.  Total time from sign-in until discharge is approximately 2-3 hours.  You should go home directly after your procedure and rest. You can resume normal activities the day after your procedure.  The day of your procedure you should not: Drive Make legal decisions Operate machinery Drink alcohol Return to work  You will receive specific instructions about eating, activities and medications before you leave.

## 2019-10-14 NOTE — Telephone Encounter (Signed)
Left message on machine to call back  

## 2019-10-15 ENCOUNTER — Telehealth: Payer: Self-pay | Admitting: Nurse Practitioner

## 2019-10-15 NOTE — Telephone Encounter (Signed)
Patient's wife calling to follow up on paperwork for Creon please advise

## 2019-10-19 ENCOUNTER — Telehealth: Payer: Self-pay | Admitting: Family Medicine

## 2019-10-19 ENCOUNTER — Telehealth: Payer: Self-pay | Admitting: Nurse Practitioner

## 2019-10-19 NOTE — Telephone Encounter (Signed)
Patients wife is calling with questions regarding patient. She has questions about patients referral to [facility] and how far out he's schedule. Please give her a call back at [phone]

## 2019-10-19 NOTE — Telephone Encounter (Signed)
Returned call no answer LMTCB 

## 2019-10-20 ENCOUNTER — Other Ambulatory Visit: Payer: Self-pay

## 2019-10-20 MED ORDER — PANCRELIPASE (LIP-PROT-AMYL) 36000-114000 UNITS PO CPEP: ORAL_CAPSULE | ORAL | 11 refills | Status: DC

## 2019-10-20 NOTE — Telephone Encounter (Signed)
The pt is scheduled for both EUS and EGD.  He and his wife were advised and instructed.  All information was also sent to the pt via My Chart.

## 2019-10-20 NOTE — Telephone Encounter (Signed)
Victor Huber and Dr. Rush Landmark,  I spoke to the patient and his wife. He wanted to make sure that his swallowing difficulty was going to be assessed at the time of his procedures with Dr. Rush Landmark.  Please verify that he is scheduled for both an EGD and EUS with Dr. Rush Landmark. He describes feeling as if food just sits in the end of the esophagus where it joins the stomach. He questioned if a barium swallow study was going to be done at the time of his EGD/EUS with Dr. Rush Landmark. I explained to him a barium swallow study is not done at the time of his EGD/EUS. However, if his EGD is normal a barium swallow might be ordered in the future. 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 He continues to have diarrhea. He has not started Creon yet but Eustaquio Maize has provided him with samples and she is working on the [employer]. I advised him to take 1/2  to one tab of Imodium as needed. He will call our office if his symptoms worsen prior to his Sept EGD/EUS date. He questioned if he would need to take the Creon forever. I informed the patient further recommendations would be determined after his EUS was completed and after see what his response is to the Creon. If his diarrhea improves on Creon then  he should continue it until otherwise instructed. He seemed to be quite frustrated. I took time to answer all of their questions to the best of my ability.   Dr. Doreene Eland, they would like the opportunity to speak with your face to face prior to the start of the EGD/EUS.   Thank you all, Jaclyn Shaggy

## 2019-10-20 NOTE — Telephone Encounter (Signed)
See telephone encounter from 10/15/19.

## 2019-10-20 NOTE — Telephone Encounter (Signed)
[name] I am assisting with the [employer] Patient Assistance for Creon. Separate issue from this. I spoke with Victor Huber. She and the patient are not clear on what the MRI showed. They are aware of the planned EUS but not on "the why" for the EUS. I did very little in discussing this with her. I was focused on getting the Creon.  She does look forward to talking with you.  Of note, the patient is having difficulty swallowing solids. The solid foods feel like they get stuck. [name] says it will sound like a sink unable to drain, a gurgling sound. He will cough. He will sips liquids and either gags then regurgitates or finally gets the food down. Some strangling with liquids.

## 2019-10-20 NOTE — Telephone Encounter (Signed)
Pt called wanting to speak with you regarding paperwork for Creon

## 2019-10-20 NOTE — Telephone Encounter (Signed)
Form for Creon Nurse Ambassador program were faxed previously.  The Ambassador does not complete an application for medication assistance, but would be able to guide the patient towards any programs that do offer assistance with medication.  I have printed out and filled in as much information as I can for the [facility] Patient Assistance program for Creon.  The patient will have to sign the forms, and supply a copy of the federal tax return to fax with the application. There is also the option to mail the application with the financial information if preferred, and it can be mailed by the patient.  I have left this information on the voicemail of the patient's listed mobile phone number.

## 2019-10-20 NOTE — Telephone Encounter (Signed)
Spoke with patients wife who states that patient was referred to gastrologist by doctor at ED patient went for appointment and was told that he would need to be on Creon due to the price of the medication they were able to fill out financial from to see if they were eligible to receive assistance with this medication. They have not heard back from anyone in regards to this nor have they heard from anyone regarding appointment from gastrologist. I informed Mrs. Cull that I would try to reach someone with gastro and see what the hold up is and give her a call back.

## 2019-10-20 NOTE — Telephone Encounter (Signed)
Patient wife called back and is requesting a call back, please advise. CB is [phone]

## 2019-10-20 NOTE — Telephone Encounter (Signed)
All required documents faxed with the completed application to [facility] Patient assistance.  Records to be given back to the patient. Samples of Creon 36000 units 4 boxes of 12 each box at the front desk for pick up. Records with the samples.

## 2019-10-23 NOTE — Telephone Encounter (Signed)
The pt advised and will keep appt as planned for procedure.

## 2019-10-23 NOTE — Telephone Encounter (Signed)
Patty, Thanks for getting everything set up. If the patient and wife would like to see me in clinic first I am happy to see him before his September EGD/EUS. I am not opposed to moving forward with a barium swallow if he and his wife would like to have as much information before an endoscopy. If there okay with that I am okay with moving forward with a barium esophagram. And again as I said he can be seen in clinic by me in the coming weeks before his procedure. Thanks. GM

## 2019-10-25 ENCOUNTER — Other Ambulatory Visit: Payer: Self-pay | Admitting: Cardiovascular Disease

## 2019-10-26 NOTE — Telephone Encounter (Addendum)
Prescription refill request for Eliquis received.  Last office visit: Oval Linsey, 03/19/2019 Scr: 0.95, 10/08/2019 Age: 69 Weight: 105.3 kg   Prescription refill sent.

## 2019-10-27 ENCOUNTER — Telehealth: Payer: Self-pay | Admitting: Gastroenterology

## 2019-10-27 NOTE — Telephone Encounter (Signed)
Patient calling to advise that the DEA and or NPI number for Victor Huber is missing from the paperwork and it is required please call patient to advise.

## 2019-10-27 NOTE — Telephone Encounter (Signed)
I tried calling the [employer]. The hold time is long. I re-faxed the application page with the NPI. The DEA is on the application. Both NPI and DEA are on the prescription that were sent with the application. I have refaxed the application.

## 2019-10-27 NOTE — Progress Notes (Signed)
[facility] OFFICE PROGRESS NOTE  Kremer, William Alfred, MD [address] [place] [address]  DIAGNOSIS: Metastatic non-small cell lung cancer initially diagnosed as stage IIIA (T2a, N2, M0) non-small cell lung cancer, poorly differentiated adenocarcinoma presented with left lower lobe lung mass in addition to mediastinal lymphadenopathy. The patient was diagnosed with metastatic disease involving the left femur as well as left supraclavicular nodal metastases and right paratracheal lymphadenopathy in October 2018.  Biomarker Findings Microsatellite Status - MS-Stable Tumor Mutational Burden - TMB-Low (3 Muts/Mb) Genomic Findings For a complete list of the genes assayed, please refer to the Appendix. STK11 P281fs*6 CDKN2A p14ARF splice site 193+1G>T DAXX E374* MLL2 V4533fs*40 NBN K233fs*5 NOTCH2 R1410H PMS2 splice site 988+1G>A 8 Disease relevant genes with no reportable alterations: EGFR, KRAS, ALK, BRAF, MET, RET, ERBB2, ROS1   PDL1 expression5%  PRIOR THERAPY: 1) status post wedge resection of the left lower lobe lung mass as well as AP window lymph node dissection but there was residual metastatic mediastinal lymphadenopathy that could not be resected. 2) a course of concurrent chemoradiation with weekly carboplatin and paclitaxel in [place] completed 03/01/2015.  3) status post palliative radiotherapy to the left femur metastatic bone disease. 4) Systemic chemotherapy with carboplatin for AUC of 5, Alimta 500 mg/M2 and Keytruda 200 mg IV every 3 weeks. First dose March 07, 2017. Carboplatin was discontinued during cycle #2 secondary to hypersensitivity reaction. 5) status post 2 cycles of maintenance treatment with Alimta and Ketruda (pembrolizumab). Alimta was discontinued secondary to intolerance  CURRENT THERAPY: Maintenance treatment with single agent Keytruda (pembrolizumab) status post41cycles  INTERVAL HISTORY: Victor Huber  69 y.o. male returns to the clinic today for a follow up visit. The patient had an episode of acute pancreatitis in May 2021. He is currently being followed by GI. He recently had an MRI/MRCP. Unclear etiology of his pancreatitis and cannot exclude immunotherapy. He is scheduled for an EUS in September 2020.  At his last appointment, we had a lengthy discussion with the patient about holding his treatment with immunotherapy vs. continuing treatment. The patient opted to continue immunotherapy with Keytruda and he tolerated his most recent treatment well without any adverse side effects. Denies abdominal pain, nausea, vomiting, or fevers. He has been having a challenging time with the change in color and caliber of the stools but it is getting better since getting creon. He has soft stools once per day currently which is greatly improved from the 5 episodes of loose/watery stool he was having prior to the creon. He has some associated weight loss due to having a hard time digesting fatty foods. He lost an additional 6 lbs since his last appointment. He is still reporting some dysphagia and having feeling like food is having a hard time going down sometimes.   Denies any chills or night sweats. He is reporting fatigue. He reports his baseline dyspnea on exertion and dry cough but denies any chest pain or hemoptysis.He has COPD. He is followed by pulmonology and has an upcoming appointment with Dr. Byrum.He denies any headache or visual changes. He denies any rashes or skin changes.He is here today for evaluation before starting cycle #41.  MEDICAL HISTORY: Past Medical History:  Diagnosis Date  . Adenocarcinoma of left lung, stage 3 (HCC) 08/15/2016  . Arthritis   . Atrial fibrillation (HCC) 08/15/2016  . Atrial fibrillation (HCC)   . Cancer, metastatic to bone (HCC)   . COPD (chronic obstructive pulmonary disease) (HCC) 08/15/2016  .   History of chemotherapy   . History of radiation therapy   .  Hyperlipidemia   . Hypothyroid 08/15/2016  . Longstanding persistent atrial fibrillation ([place]) 08/29/2016  . Pathologic fracture    left femur  . Pneumonitis   . S/P TURP 08/15/2016  . Wears glasses   . Wears hearing aid in both ears     ALLERGIES:  is allergic to carboplatin, flecainide, and debrox [carbamide peroxide].  MEDICATIONS:  Current Outpatient Medications  Medication Sig Dispense Refill  . albuterol (VENTOLIN HFA) 108 (90 Base) MCG/ACT inhaler Inhale 1-2 puffs into the lungs every 4 (four) hours as needed for wheezing or shortness of breath. 3 month supply 24 g 3  . atenolol (TENORMIN) 50 MG tablet TAKE 1 TABLET (50 MG TOTAL) BY MOUTH DAILY. 90 tablet 3  . atorvastatin (LIPITOR) 40 MG tablet Take 1 tablet (40 mg total) by mouth daily. 90 tablet 2  . citalopram (CELEXA) 40 MG tablet TAKE 1 TABLET EVERY DAY 90 tablet 1  . ELIQUIS 5 MG TABS tablet TAKE 1 TABLET TWICE DAILY 180 tablet 1  . ibuprofen (ADVIL,MOTRIN) 400 MG tablet Take 400 mg by mouth every 4 (four) hours as needed for moderate pain.    Marland Kitchen levothyroxine (SYNTHROID) 150 MCG tablet Take 150 mcg by mouth daily before breakfast.    . lipase/protease/amylase (CREON) 36000 UNITS CPEP capsule 2 capsules 3 times daily with meals and 1 capsule twice daily with snacks 240 capsule 11  . Oxycodone HCl 10 MG TABS Take one daily as needed for severe back pain. 30 tablet 0  . tamsulosin (FLOMAX) 0.4 MG CAPS capsule Take 1 capsule (0.4 mg total) by mouth daily. 90 capsule 4   No current facility-administered medications for this visit.    SURGICAL HISTORY:  Past Surgical History:  Procedure Laterality Date  . BRONCHOSCOPY  10/2014  . CARDIAC CATHETERIZATION     05/07/12  . CARDIOVERSION     x2  . COLONOSCOPY    . DG BIOPSY LUNG Left 10/2014   FNA - Adenocarcinoma   . FEMUR IM NAIL Left 02/19/2017  . FEMUR IM NAIL Left 02/19/2017   Procedure: INTRAMEDULLARY (IM) NAIL FEMORAL;  Surgeon: Marchia Bond, MD;  Location: [facility];   Service: Orthopedics;  Laterality: Left;  . IR FLUORO GUIDE PORT INSERTION RIGHT  07/03/2017  . IR US GUIDE VASC ACCESS RIGHT  07/03/2017  . LUNG CANCER SURGERY Left 12/2014   Wedge Resection   . MULTIPLE TOOTH EXTRACTIONS    . Status post TURP    . TONSILLECTOMY      REVIEW OF SYSTEMS:   Review of Systems  Constitutional: Positive for appetite change, fatigue, and weight loss. Negative for chills and fever.  HENT: Positive for some dysphagia. Negative for mouth sores, nosebleeds, or sore throat. Eyes: Negative for eye problems and icterus.  Respiratory: Positive for dyspnea on exertion and chronic cough.Negative for hemoptysis  and wheezing.   Cardiovascular: Negative for chest pain and leg swelling.  Gastrointestinal: Positive for lighter stools and softer stools. Negative for abdominal pain, constipation, nausea and vomiting.  Genitourinary: Negative for bladder incontinence, difficulty urinating, dysuria, frequency and hematuria.   Musculoskeletal: Negative for back pain, gait problem, neck pain and neck stiffness.  Skin: Negative for itching and rash.  Neurological: Negative for dizziness, extremity weakness, gait problem, headaches, light-headedness and seizures.  Hematological: Negative for adenopathy. Does not bruise/bleed easily.  Psychiatric/Behavioral: Negative for confusion, depression and sleep disturbance. The patient is not nervous/anxious.  PHYSICAL EXAMINATION:  Blood pressure 107/74, pulse 79, temperature 97.9 F (36.6 C), temperature source Temporal, resp. rate 20, height 6' 2" (1.88 m), weight (!) 226 lb 12.8 oz (102.9 kg), SpO2 96 %.  ECOG PERFORMANCE STATUS: 1 - Symptomatic but completely ambulatory  Physical Exam  Constitutional: Oriented to person, place, and time and well-developed, well-nourished, and in no distress.  HENT:  Head: Normocephalic and atraumatic.  Mouth/Throat: Oropharynx is clear and moist. No oropharyngeal exudate.  Eyes: Conjunctivae are  normal. Right eye exhibits no discharge. Left eye exhibits no discharge. No scleral icterus.  Neck: Normal range of motion. Neck supple.  Cardiovascular: Normal rate,irregular rhythm, normal heart sounds and intact distal pulses.  Pulmonary/Chest: Effort normal and breath sounds normal. No respiratory distress. No wheezes. No rales.  Abdominal: Soft. Bowel sounds are normal. Exhibits no distension and no mass. There is no tenderness.  Musculoskeletal: Normal range of motion. Exhibits no edema.  Lymphadenopathy:  No cervical adenopathy.  Neurological: Alert and oriented to person, place, and time. Exhibits normal muscle tone. Gait normal. Coordination normal.  Skin: Skin is warm and dry. No rash noted. Not diaphoretic. No erythema. No pallor.  Psychiatric: Mood, memory and judgment normal.  Vitals reviewed.  LABORATORY DATA: Lab Results  Component Value Date   WBC 6.8 10/29/2019   HGB 13.9 10/29/2019   HCT 39.6 10/29/2019   MCV 95.7 10/29/2019   PLT 147 (L) 10/29/2019      Chemistry      Component Value Date/Time   NA 139 10/29/2019 0937   NA 136 04/04/2017 1141   K 4.0 10/29/2019 0937   K 4.4 04/04/2017 1141   CL 107 10/29/2019 0937   CO2 23 10/29/2019 0937   CO2 24 04/04/2017 1141   BUN 12 10/29/2019 0937   BUN 21.1 04/04/2017 1141   CREATININE 0.84 10/29/2019 0937   CREATININE 1.1 04/04/2017 1141      Component Value Date/Time   CALCIUM 9.6 10/29/2019 0937   CALCIUM 9.1 04/04/2017 1141   ALKPHOS 108 10/29/2019 0937   ALKPHOS 89 04/04/2017 1141   AST 23 10/29/2019 0937   AST 17 04/04/2017 1141   ALT 23 10/29/2019 0937   ALT 18 04/04/2017 1141   BILITOT 0.7 10/29/2019 0937   BILITOT 0.67 04/04/2017 1141       RADIOGRAPHIC STUDIES:  CT Chest W Contrast  Result Date: 10/07/2019 CLINICAL DATA:  Non-small cell lung cancer staging. EXAM: CT CHEST WITH CONTRAST TECHNIQUE: Multidetector CT imaging of the chest was performed during intravenous contrast  administration. CONTRAST:  26m OMNIPAQUE IOHEXOL 300 MG/ML  SOLN COMPARISON:  June 15, 2019 FINDINGS: Cardiovascular: Calcified and noncalcified atheromatous plaque in the thoracic aorta. Aortic caliber is normal. Central pulmonary vasculature mildly engorged without additional abnormality on venous phase. Heart size stable with small pericardial effusion. Calcified coronary artery disease similar to the prior study. Mediastinum/Nodes: RIGHT sided Port-A-Cath remains in place terminating in the upper RIGHT atrium. Chronic narrowing of the RIGHT break cephalic vein about the Port-A-Cath. No thoracic inlet adenopathy.  No axillary lymphadenopathy. RIGHT paratracheal lymph node (image 47, series 2) 12-13 mm short axis similar to the prior study. Small lymph nodes posterior to the trachea adjacent esophagus just below thoracic inlet are also unchanged. Low RIGHT paratracheal lymph node (image 66, series 2) 15 mm short axis unchanged. Subcarinal nodal enlargement (image 87, series 2) 16 mm short axis unchanged compared to prior study. Mild fullness of RIGHT hilar nodal tissue without frank enlargement unchanged from  the prior study. Distortion of LEFT hilum in the setting of treated disease in the LEFT chest with similar appearance. Lungs/Pleura: Emphysema and signs of septal thickening. LEFT basilar consolidative/post treatment changes extending from the LEFT hilum into the superior segment of the LEFT lower lobe with volume loss and bronchiectasis are similar to the prior study. Small nodule in the LEFT chest (image 51, series 7) 6 mm. Amidst an area of septal thickening in the LEFT upper lobe, not seen on previous imaging. Small amount of pleural fluid and/or pleural thickening is stable. In the RIGHT chest anterior to the minor fissure (image 105, series 7) 2.5 x 2.1 cm area of nodularity, part solid features. More solid component measuring approximately 7 mm, not present on the previous study. Mild fissural  thickening adjacent to this area. Upper Abdomen: Cholelithiasis. Mild peripancreatic stranding. No ductal dilation. Peripancreatic stranding is perhaps slightly increased compared to more recent CT of the abdomen and pelvis from May 18th, similar accounting for differences in technique when compared to the study of September 22, 2019. Musculoskeletal: No acute or destructive bone process. Post LEFT thoracotomy. Spinal degenerative changes. IMPRESSION: 1. In the RIGHT chest anterior to the minor fissure there is a 2.5 x 2.1 cm area of nodularity, part solid features. More solid component measuring approximately 7 mm, not present on the previous study. In the setting of interstitial lung disease while findings are concerning and new since abdominal CT as well of Aug 18, 2019. Given development in the short interval, inflammatory process is considered. Consider discussion in multi disciplinary thoracic oncology setting with short interval follow-up with CT or PET scan as warranted. 2. Small nodule associated with areas of septal thickening in the LEFT upper lobe as discussed approximately 6 mm. This could be assessed on follow-up as well. 3. Stable appearance of LEFT basilar consolidative/post treatment changes extending from the LEFT hilum into the superior segment of the LEFT lower lobe with volume loss and bronchiectasis. 4. Stable mediastinal adenopathy. 5. Cholelithiasis. 6. Mild peripancreatic stranding is perhaps slightly increased compared to more recent CT of the abdomen and pelvis from May 18th, similar accounting for differences in technique when compared to the study of September 22, 2019. Correlate with any continued symptoms. Again based on morphology of the pancreas IgG 4 related disease/autoimmune pancreatitis is considered. 7. Emphysema and aortic atherosclerosis. Aortic Atherosclerosis (ICD10-I70.0) and Emphysema (ICD10-J43.9). Electronically Signed   By: Zetta Bills M.D.   On: 10/07/2019 09:13      ASSESSMENT/PLAN:  This is a very pleasant 69 year old Caucasian male with metastatic non-small cell lung cancer, adenocarcinoma. He was initially diagnosed as astage IIIa. He presented with a left lower lobe lung mass in addition to mediastinal lymphadenopathy. He was diagnosed in January 2016. He is status post left lower lobectomy with lymph node dissection followed by concurrent chemoradiation. His PDL 1 expression is 5%.  He was diagnosed with metastatic disease involving the leftfemuras well as a left supraclavicular nodalmetastasis and right paratracheal lymphadenopathy in October 2018.  Hewasstarted on systemic chemotherapy with carboplatin, Alimta, and Keytruda. Carboplatin was discontinued after cycle #2 due to a hypersensitivity reaction. Alimta was also discontinued after cycle #4 due to intolerance. The patient has been on maintenance treatment with single agent Keytruda for41cycles. At his last appointment on 10/08/2019 Dr. Julien Nordmann had a lengthy discussion with the patient about his recent episodes of pancreatitis. Discussed that the etiology is unclear but we cannot exclude it being immunotherapy mediated. He was given the option  of holding treatment with Keytruda vs continuing. The patient wanted to proceed with keytruda due to having done well with it for several years.   The patient tolerated his last cycle of Keytruda well without any recurrent symptoms of pancreatitis. Labs were reviewed. He will proceed with cycle #41 today as scheduled.   We will see him back for a follow up visit in 3 weeks for evaluation before starting cycle #42.   Of course, he was advised to call us immediately or seek medical evaluation if he develops new or concerning symptoms in the interval.   He will continue to follow with GI as well for his history of pancreatitis.    His TSH is still pending at this time. Will reach out to the patient if his synthroid needs to be adjusted.    The patient was advised to call immediately if he has any concerning symptoms in the interval. The patient voices understanding of current disease status and treatment options and is in agreement with the current care plan. All questions were answered. The patient knows to call the clinic with any problems, questions or concerns. We can certainly see the patient much sooner if necessary     No orders of the defined types were placed in this encounter.    Cassandra L Heilingoetter, PA-C 10/29/19  

## 2019-10-27 NOTE — Telephone Encounter (Signed)
Spoke with Mrs. Victor Huber.  She had received a letter from the patient assistance program requesting this information.

## 2019-10-29 ENCOUNTER — Other Ambulatory Visit: Payer: Self-pay

## 2019-10-29 ENCOUNTER — Inpatient Hospital Stay (HOSPITAL_BASED_OUTPATIENT_CLINIC_OR_DEPARTMENT_OTHER): Payer: Medicare HMO | Admitting: Physician Assistant

## 2019-10-29 ENCOUNTER — Inpatient Hospital Stay: Payer: Medicare HMO

## 2019-10-29 VITALS — BP 107/74 | HR 79 | Temp 97.9°F | Resp 20 | Ht 74.0 in | Wt 226.8 lb

## 2019-10-29 DIAGNOSIS — C3492 Malignant neoplasm of unspecified part of left bronchus or lung: Secondary | ICD-10-CM

## 2019-10-29 DIAGNOSIS — Z902 Acquired absence of lung [part of]: Secondary | ICD-10-CM | POA: Diagnosis not present

## 2019-10-29 DIAGNOSIS — Z95828 Presence of other vascular implants and grafts: Secondary | ICD-10-CM

## 2019-10-29 DIAGNOSIS — C7951 Secondary malignant neoplasm of bone: Secondary | ICD-10-CM | POA: Diagnosis not present

## 2019-10-29 DIAGNOSIS — C3432 Malignant neoplasm of lower lobe, left bronchus or lung: Secondary | ICD-10-CM | POA: Diagnosis not present

## 2019-10-29 DIAGNOSIS — Z923 Personal history of irradiation: Secondary | ICD-10-CM | POA: Diagnosis not present

## 2019-10-29 DIAGNOSIS — Z5112 Encounter for antineoplastic immunotherapy: Secondary | ICD-10-CM

## 2019-10-29 DIAGNOSIS — R590 Localized enlarged lymph nodes: Secondary | ICD-10-CM | POA: Diagnosis not present

## 2019-10-29 DIAGNOSIS — Z79899 Other long term (current) drug therapy: Secondary | ICD-10-CM | POA: Diagnosis not present

## 2019-10-29 DIAGNOSIS — Z9221 Personal history of antineoplastic chemotherapy: Secondary | ICD-10-CM | POA: Diagnosis not present

## 2019-10-29 LAB — CBC WITH DIFFERENTIAL (CANCER CENTER ONLY)
Abs Immature Granulocytes: 0.01 10*3/uL (ref 0.00–0.07)
Basophils Absolute: 0 10*3/uL (ref 0.0–0.1)
Basophils Relative: 0 %
Eosinophils Absolute: 0.1 10*3/uL (ref 0.0–0.5)
Eosinophils Relative: 1 %
HCT: 39.6 % (ref 39.0–52.0)
Hemoglobin: 13.9 g/dL (ref 13.0–17.0)
Immature Granulocytes: 0 %
Lymphocytes Relative: 21 %
Lymphs Abs: 1.5 10*3/uL (ref 0.7–4.0)
MCH: 33.6 pg (ref 26.0–34.0)
MCHC: 35.1 g/dL (ref 30.0–36.0)
MCV: 95.7 fL (ref 80.0–100.0)
Monocytes Absolute: 0.5 10*3/uL (ref 0.1–1.0)
Monocytes Relative: 8 %
Neutro Abs: 4.7 10*3/uL (ref 1.7–7.7)
Neutrophils Relative %: 70 %
Platelet Count: 147 10*3/uL — ABNORMAL LOW (ref 150–400)
RBC: 4.14 MIL/uL — ABNORMAL LOW (ref 4.22–5.81)
RDW: 14.7 % (ref 11.5–15.5)
WBC Count: 6.8 10*3/uL (ref 4.0–10.5)
nRBC: 0 % (ref 0.0–0.2)

## 2019-10-29 LAB — CMP (CANCER CENTER ONLY)
ALT: 23 U/L (ref 0–44)
AST: 23 U/L (ref 15–41)
Albumin: 3.5 g/dL (ref 3.5–5.0)
Alkaline Phosphatase: 108 U/L (ref 38–126)
Anion gap: 9 (ref 5–15)
BUN: 12 mg/dL (ref 8–23)
CO2: 23 mmol/L (ref 22–32)
Calcium: 9.6 mg/dL (ref 8.9–10.3)
Chloride: 107 mmol/L (ref 98–111)
Creatinine: 0.84 mg/dL (ref 0.61–1.24)
GFR, Est AFR Am: >60 mL/min (ref >60)
GFR, Estimated: >60 mL/min (ref >60)
Glucose, Bld: 140 mg/dL — ABNORMAL HIGH (ref 70–99)
Potassium: 4 mmol/L (ref 3.5–5.1)
Sodium: 139 mmol/L (ref 135–145)
Total Bilirubin: 0.7 mg/dL (ref 0.3–1.2)
Total Protein: 6 g/dL — ABNORMAL LOW (ref 6.5–8.1)

## 2019-10-29 LAB — TSH: TSH: 14.226 u[IU]/mL — ABNORMAL HIGH (ref 0.320–4.118)

## 2019-10-29 MED ORDER — SODIUM CHLORIDE 0.9% FLUSH
10.0000 mL | INTRAVENOUS | Status: DC | PRN
  Administered 2019-10-29: 10 mL
  Filled 2019-10-29: qty 10

## 2019-10-29 MED ORDER — HEPARIN SOD (PORK) LOCK FLUSH 100 UNIT/ML IV SOLN
500.0000 [IU] | Freq: Once | INTRAVENOUS | Status: AC | PRN
  Administered 2019-10-29: 500 [IU]
  Filled 2019-10-29: qty 5

## 2019-10-29 MED ORDER — SODIUM CHLORIDE 0.9 % IV SOLN
Freq: Once | INTRAVENOUS | Status: AC
  Filled 2019-10-29: qty 250

## 2019-10-29 MED ORDER — SODIUM CHLORIDE 0.9 % IV SOLN
200.0000 mg | Freq: Once | INTRAVENOUS | Status: AC
  Administered 2019-10-29: 200 mg via INTRAVENOUS
  Filled 2019-10-29: qty 8

## 2019-10-29 NOTE — Addendum Note (Signed)
Addended by: Wilmon Arms on: 10/29/2019 11:40 AM   Modules accepted: Orders

## 2019-10-29 NOTE — Patient Instructions (Signed)

## 2019-10-29 NOTE — Patient Instructions (Signed)
[facility] Discharge Instructions for Patients Receiving Chemotherapy  Today you received the following chemotherapy agents:  Keytruda.  To help prevent nausea and vomiting after your treatment, we encourage you to take your nausea medication as directed.   If you develop nausea and vomiting that is not controlled by your nausea medication, call the clinic.   BELOW ARE SYMPTOMS THAT SHOULD BE REPORTED IMMEDIATELY:  *FEVER GREATER THAN 100.5 F  *CHILLS WITH OR WITHOUT FEVER  NAUSEA AND VOMITING THAT IS NOT CONTROLLED WITH YOUR NAUSEA MEDICATION  *UNUSUAL SHORTNESS OF BREATH  *UNUSUAL BRUISING OR BLEEDING  TENDERNESS IN MOUTH AND THROAT WITH OR WITHOUT PRESENCE OF ULCERS  *URINARY PROBLEMS  *BOWEL PROBLEMS  UNUSUAL RASH Items with * indicate a potential emergency and should be followed up as soon as possible.  Feel free to call the clinic should you have any questions or concerns. The clinic phone number is [phone].  Please show the CHEMO ALERT CARD at check-in to the Emergency Department and triage nurse.    

## 2019-11-05 DIAGNOSIS — D6859 Other primary thrombophilia: Secondary | ICD-10-CM | POA: Diagnosis not present

## 2019-11-05 DIAGNOSIS — K859 Acute pancreatitis without necrosis or infection, unspecified: Secondary | ICD-10-CM | POA: Diagnosis not present

## 2019-11-05 DIAGNOSIS — I4891 Unspecified atrial fibrillation: Secondary | ICD-10-CM | POA: Diagnosis not present

## 2019-11-05 DIAGNOSIS — C7951 Secondary malignant neoplasm of bone: Secondary | ICD-10-CM | POA: Diagnosis not present

## 2019-11-05 DIAGNOSIS — F339 Major depressive disorder, recurrent, unspecified: Secondary | ICD-10-CM | POA: Diagnosis not present

## 2019-11-05 DIAGNOSIS — C3432 Malignant neoplasm of lower lobe, left bronchus or lung: Secondary | ICD-10-CM | POA: Diagnosis not present

## 2019-11-05 DIAGNOSIS — I700 Atherosclerosis of aorta: Secondary | ICD-10-CM | POA: Diagnosis not present

## 2019-11-05 DIAGNOSIS — F1021 Alcohol dependence, in remission: Secondary | ICD-10-CM | POA: Diagnosis not present

## 2019-11-05 DIAGNOSIS — E039 Hypothyroidism, unspecified: Secondary | ICD-10-CM | POA: Diagnosis not present

## 2019-11-10 ENCOUNTER — Other Ambulatory Visit: Payer: Self-pay

## 2019-11-10 ENCOUNTER — Encounter: Payer: Self-pay | Admitting: Emergency Medicine

## 2019-11-10 ENCOUNTER — Ambulatory Visit (INDEPENDENT_AMBULATORY_CARE_PROVIDER_SITE_OTHER): Payer: Medicare HMO | Admitting: Emergency Medicine

## 2019-11-10 DIAGNOSIS — C3492 Malignant neoplasm of unspecified part of left bronchus or lung: Secondary | ICD-10-CM

## 2019-11-10 DIAGNOSIS — J441 Chronic obstructive pulmonary disease with (acute) exacerbation: Secondary | ICD-10-CM

## 2019-11-10 NOTE — Assessment & Plan Note (Signed)
Following with Dr. Julien Nordmann.  Currently on observation.  Serial CT scans.

## 2019-11-10 NOTE — Assessment & Plan Note (Signed)
Using albuterol as needed and before exercise.  States that he is benefiting.  Depending on his usage pattern we may decide to retry a long-acting bronchodilator at some point going forward.  I will continue the SABA as needed for now

## 2019-11-10 NOTE — Patient Instructions (Signed)
Continue to follow with Dr. Earlie Server as planned. Get your repeat CT scan of the chest per Dr. Worthy Flank recommendations Continue your albuterol 2 puffs up to every 4 hours if needed, or prior to exercise Follow with Dr Lamonte Sakai in 6 months or sooner if you have any problems

## 2019-11-10 NOTE — Progress Notes (Signed)
Subjective:    Patient ID: Victor Huber, male    DOB: 1950-06-09, 69 y.o.   MRN: [id number]  HPI Victor Huber is a 69 year old former smoker (50 pack years) with a history of COPD and stage IV non-small cell lung cancer, adenoCA.  He underwent left lower lobe wedge resection and chemoradiation, subsequently on Keytruda.  Most recent chest imaging 01/01/2019, reviewed and shows stable mediastinal adenopathy, postoperative and postradiation changes in the left inferior hilum, no evidence of new disease.  He is currently managed on albuterol which he uses as needed.  He uses this rarely.   He may be coughing more, non-productive. He is having more dyspnea, happens with bending. He can walk around the block, exert but has to stop to rest. He has some wheeze in the am, coughs up some mucous in the am. Has some nasal gtt at night.   ROV 09/09/19 --this follow-up visit for 69 year old gentleman gentleman with a history of COPD and stage IV adenocarcinoma of the lung.  He underwent a left lower lobe wedge resection and then chemoradiation, Keytruda.  Most recent CT imaging 06/15/2019 reviewed by me, shows overall stable mediastinal and subcarinal lymphadenopathy, stable right upper lobe scar, stable right gastric adenopathy.  No evidence of recurrence.  In 03/2019 we did trial starting Stiolto to see if he would get benefit. He didn't fell that it helped him to any degree. He is having some increased exertional SOB with walking but notes that his exercise routine is decreased, out of shape. He uses albuterol every few weeks, believes that it probably helps him some.   ROV 11/10/19 --Victor Huber is 69 years old and has a history of COPD.  He is followed by Dr. Julien Huber for stage IV adenocarcinoma of the lung post left lower lobe wedge resection.  Most recent CT chest was done 10/07/2019 which I have reviewed, shows a right anterior 2.5 x 2.1 cm part solid nodule, 6 mm left upper lobe nodule, stable left post radiation changes that  extend to the left hilum associated with some volume loss and bronchiectasis, stable mediastinal adenopathy.  He is not on scheduled bronchodilators.  He has albuterol which he uses prior to exercise.  He reports that he is staying active, exercises. Uses his albuterol once a day and benefits from it. He doesn't feel that he needs any changes in his regimen at this time.  Next scan will be in October.    Review of Systems  Constitutional: Negative for fever and unexpected weight change.  HENT: Negative for congestion, dental problem, ear pain, nosebleeds, postnasal drip, rhinorrhea, sinus pressure, sneezing, sore throat and trouble swallowing.   Eyes: Negative for redness and itching.  Respiratory: Positive for cough and shortness of breath. Negative for chest tightness and wheezing.   Cardiovascular: Positive for palpitations. Negative for leg swelling.  Gastrointestinal: Negative for nausea and vomiting.  Genitourinary: Negative for dysuria.  Musculoskeletal: Negative for joint swelling.  Skin: Positive for rash.  Neurological: Negative for headaches.  Hematological: Does not bruise/bleed easily.  Psychiatric/Behavioral: Negative for dysphoric mood. The patient is not nervous/anxious.     Past Medical History:  Diagnosis Date  . Adenocarcinoma of left lung, stage 3 ([facility]) 08/15/2016  . Arthritis   . Atrial fibrillation ([facility]) 08/15/2016  . Atrial fibrillation ([place])   . Cancer, metastatic to bone ([facility])   . COPD (chronic obstructive pulmonary disease) ([facility]) 08/15/2016  . History of chemotherapy   . History of radiation therapy   .  Hyperlipidemia   . Hypothyroid 08/15/2016  . Longstanding persistent atrial fibrillation ([place]) 08/29/2016  . Pathologic fracture    left femur  . Pneumonitis   . S/P TURP 08/15/2016  . Wears glasses   . Wears hearing aid in both ears      Family History  Problem Relation Age of Onset  . Breast cancer Mother   . Breast cancer Maternal Aunt   . Breast  cancer Maternal Aunt   . Lung disease Neg Hx   . Colon cancer Neg Hx   . Stomach cancer Neg Hx   . Pancreatic cancer Neg Hx   . Rectal cancer Neg Hx      Social History   Socioeconomic History  . Marital status: Married    Spouse name: Not on file  . Number of children: Not on file  . Years of education: Not on file  . Highest education level: Not on file  Occupational History  . Not on file  Tobacco Use  . Smoking status: Former Smoker    Packs/day: 1.00    Years: 50.00    Pack years: 50.00    Types: Cigarettes    Quit date: 08/15/2012    Years since quitting: 7.2  . Smokeless tobacco: Never Used  Vaping Use  . Vaping Use: Never used  Substance and Sexual Activity  . Alcohol use: No  . Drug use: No  . Sexual activity: Not on file  Other Topics Concern  . Not on file  Social History Narrative   [facility] (09/26/16):   Originally from [place]. Moved to [facility] February 2018. Always lived in [place]. Moved to be closer to children & grandchildren. No international travel. 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 No mold exposure. No bird exposure or hot tub exposure. Enjoys reading. Previously enjoyed wood working with domestic woods.    Social Determinants of Health   Financial Resource Strain: Low Risk   . Difficulty of Paying Living Expenses: Not hard at all  Food Insecurity: No Food Insecurity  . Worried About Charity fundraiser in the Last Year: Never true  . Ran Out of Food in the Last Year: Never true  Transportation Needs: No Transportation Needs  . Lack of Transportation (Medical): No  . Lack of Transportation (Non-Medical): No  Physical Activity:   . Days of Exercise per Week:   . Minutes of Exercise per Session:   Stress:   . Feeling of Stress :   Social Connections:   . Frequency of Communication with Friends and Family:   . Frequency of Social Gatherings with Friends and Family:    . Attends Religious Services:   . Active Member of Clubs or Organizations:   . Attends Archivist Meetings:   Marland Kitchen Marital Status:   Intimate Partner Violence:   . Fear of Current or Ex-Partner:   . Emotionally Abused:   Marland Kitchen Physically Abused:   . Sexually Abused:   Lots of sawdust exposure.  No military  Allergies  Allergen Reactions  . Carboplatin Itching, Nausea And Vomiting and Other (See Comments)    Flushing  . Flecainide Hypertension    CAUSED HEART ISSUES   . Debrox [Carbamide Peroxide]     Swelling in ear canal.      Outpatient Medications Prior to Visit  Medication Sig Dispense Refill  . albuterol (VENTOLIN HFA) 108 (90 Base) MCG/ACT inhaler Inhale 1-2 puffs into the  lungs every 4 (four) hours as needed for wheezing or shortness of breath. 3 month supply 24 g 3  . atenolol (TENORMIN) 50 MG tablet TAKE 1 TABLET (50 MG TOTAL) BY MOUTH DAILY. 90 tablet 3  . atorvastatin (LIPITOR) 40 MG tablet Take 1 tablet (40 mg total) by mouth daily. 90 tablet 2  . citalopram (CELEXA) 40 MG tablet TAKE 1 TABLET EVERY DAY 90 tablet 1  . ELIQUIS 5 MG TABS tablet TAKE 1 TABLET TWICE DAILY 180 tablet 1  . ibuprofen (ADVIL,MOTRIN) 400 MG tablet Take 400 mg by mouth every 4 (four) hours as needed for moderate pain.    Marland Kitchen levothyroxine (SYNTHROID) 175 MCG tablet Take 175 mcg by mouth daily before breakfast.    . lipase/protease/amylase (CREON) 36000 UNITS CPEP capsule 2 capsules 3 times daily with meals and 1 capsule twice daily with snacks 240 capsule 11  . Oxycodone HCl 10 MG TABS Take one daily as needed for severe back pain. 30 tablet 0  . tamsulosin (FLOMAX) 0.4 MG CAPS capsule Take 1 capsule (0.4 mg total) by mouth daily. 90 capsule 4   No facility-administered medications prior to visit.         Objective:   Physical Exam  Today's Vitals   11/10/19 1357  BP: 112/66  Pulse: 93  Temp: (!) 97.4 F (36.3 C)  TempSrc: Temporal  SpO2: 96%  Weight: 226 lb 6.4 oz (102.7 kg)   Height: 6\' 1"  (1.854 m)   Body mass index is 29.87 kg/m.   Gen: Pleasant, overweight man, in no distress,  normal affect  ENT: No lesions,  mouth clear,  oropharynx clear, no postnasal drip  Neck: No JVD, no stridor  Lungs: No use of accessory muscles, good air movement, no crackles, no wheezing on a normal breath, no wheeze on a forced exp.   Cardiovascular: RRR, heart sounds normal, no murmur or gallops, no peripheral edema  Musculoskeletal: No deformities, no cyanosis or clubbing  Neuro: alert, awake, non focal  Skin: Warm, no lesions or rash     Assessment & Plan:  Adenocarcinoma of left lung, stage 3 (HCC) Following with Dr. Julien Huber.  Currently on observation.  Serial CT scans.  Chronic obstructive pulmonary disease (HCC) Using albuterol as needed and before exercise.  States that he is benefiting.  Depending on his usage pattern we may decide to retry a long-acting bronchodilator at some point going forward.  I will continue the SABA as needed for now    Baltazar Apo, MD, PhD 11/10/2019, 2:30 PM [facility] [phone] or if no answer [phone]

## 2019-11-13 ENCOUNTER — Other Ambulatory Visit: Payer: Self-pay

## 2019-11-13 MED ORDER — CREON 36000-114000 UNITS PO CPEP: ORAL_CAPSULE | ORAL | 11 refills | Status: DC

## 2019-11-13 NOTE — Telephone Encounter (Signed)
Patient aware that Creon has been approved.

## 2019-11-13 NOTE — Telephone Encounter (Signed)
Received letter from [facility] stating that Creon was approved through April 01, 2020. Copy of letter sent to be scanned.

## 2019-11-17 ENCOUNTER — Telehealth: Payer: Self-pay | Admitting: Nurse Practitioner

## 2019-11-17 NOTE — Telephone Encounter (Signed)
I contacted the patient and spoke with his wife. Explained that we will call when we receive mikes Creon from [employer]. Patients wife verbalized understanding.

## 2019-11-19 ENCOUNTER — Inpatient Hospital Stay: Payer: Medicare HMO

## 2019-11-19 ENCOUNTER — Inpatient Hospital Stay: Payer: Medicare HMO | Attending: Internal Medicine | Admitting: Internal Medicine

## 2019-11-19 ENCOUNTER — Encounter: Payer: Self-pay | Admitting: Internal Medicine

## 2019-11-19 ENCOUNTER — Other Ambulatory Visit: Payer: Self-pay

## 2019-11-19 VITALS — BP 101/68 | HR 87 | Temp 97.8°F | Resp 17 | Ht 73.0 in | Wt 227.6 lb

## 2019-11-19 DIAGNOSIS — C7951 Secondary malignant neoplasm of bone: Secondary | ICD-10-CM

## 2019-11-19 DIAGNOSIS — Z5112 Encounter for antineoplastic immunotherapy: Secondary | ICD-10-CM | POA: Insufficient documentation

## 2019-11-19 DIAGNOSIS — C770 Secondary and unspecified malignant neoplasm of lymph nodes of head, face and neck: Secondary | ICD-10-CM

## 2019-11-19 DIAGNOSIS — C3492 Malignant neoplasm of unspecified part of left bronchus or lung: Secondary | ICD-10-CM

## 2019-11-19 DIAGNOSIS — Z95828 Presence of other vascular implants and grafts: Secondary | ICD-10-CM

## 2019-11-19 DIAGNOSIS — C3490 Malignant neoplasm of unspecified part of unspecified bronchus or lung: Secondary | ICD-10-CM | POA: Diagnosis not present

## 2019-11-19 DIAGNOSIS — E039 Hypothyroidism, unspecified: Secondary | ICD-10-CM | POA: Insufficient documentation

## 2019-11-19 DIAGNOSIS — C3432 Malignant neoplasm of lower lobe, left bronchus or lung: Secondary | ICD-10-CM

## 2019-11-19 DIAGNOSIS — Z23 Encounter for immunization: Secondary | ICD-10-CM

## 2019-11-19 LAB — CBC WITH DIFFERENTIAL (CANCER CENTER ONLY)
Abs Immature Granulocytes: 0.02 10*3/uL (ref 0.00–0.07)
Basophils Absolute: 0 10*3/uL (ref 0.0–0.1)
Basophils Relative: 0 %
Eosinophils Absolute: 0.1 10*3/uL (ref 0.0–0.5)
Eosinophils Relative: 2 %
HCT: 39 % (ref 39.0–52.0)
Hemoglobin: 13.8 g/dL (ref 13.0–17.0)
Immature Granulocytes: 0 %
Lymphocytes Relative: 23 %
Lymphs Abs: 1.5 10*3/uL (ref 0.7–4.0)
MCH: 33.1 pg (ref 26.0–34.0)
MCHC: 35.4 g/dL (ref 30.0–36.0)
MCV: 93.5 fL (ref 80.0–100.0)
Monocytes Absolute: 0.5 10*3/uL (ref 0.1–1.0)
Monocytes Relative: 8 %
Neutro Abs: 4.4 10*3/uL (ref 1.7–7.7)
Neutrophils Relative %: 67 %
Platelet Count: 142 10*3/uL — ABNORMAL LOW (ref 150–400)
RBC: 4.17 MIL/uL — ABNORMAL LOW (ref 4.22–5.81)
RDW: 14.7 % (ref 11.5–15.5)
WBC Count: 6.5 10*3/uL (ref 4.0–10.5)
nRBC: 0 % (ref 0.0–0.2)

## 2019-11-19 LAB — CMP (CANCER CENTER ONLY)
ALT: 17 U/L (ref 0–44)
AST: 15 U/L (ref 15–41)
Albumin: 3.6 g/dL (ref 3.5–5.0)
Alkaline Phosphatase: 103 U/L (ref 38–126)
Anion gap: 7 (ref 5–15)
BUN: 12 mg/dL (ref 8–23)
CO2: 25 mmol/L (ref 22–32)
Calcium: 9.5 mg/dL (ref 8.9–10.3)
Chloride: 107 mmol/L (ref 98–111)
Creatinine: 0.8 mg/dL (ref 0.61–1.24)
GFR, Est AFR Am: >60 mL/min (ref >60)
GFR, Estimated: >60 mL/min (ref >60)
Glucose, Bld: 124 mg/dL — ABNORMAL HIGH (ref 70–99)
Potassium: 4 mmol/L (ref 3.5–5.1)
Sodium: 139 mmol/L (ref 135–145)
Total Bilirubin: 0.7 mg/dL (ref 0.3–1.2)
Total Protein: 6.2 g/dL — ABNORMAL LOW (ref 6.5–8.1)

## 2019-11-19 LAB — TSH: TSH: 7.099 u[IU]/mL — ABNORMAL HIGH (ref 0.320–4.118)

## 2019-11-19 MED ORDER — SODIUM CHLORIDE 0.9% FLUSH
10.0000 mL | INTRAVENOUS | Status: DC | PRN
  Administered 2019-11-19: 10 mL
  Filled 2019-11-19: qty 10

## 2019-11-19 MED ORDER — SODIUM CHLORIDE 0.9 % IV SOLN
200.0000 mg | Freq: Once | INTRAVENOUS | Status: AC
  Administered 2019-11-19: 200 mg via INTRAVENOUS
  Filled 2019-11-19: qty 8

## 2019-11-19 MED ORDER — HEPARIN SOD (PORK) LOCK FLUSH 100 UNIT/ML IV SOLN
500.0000 [IU] | Freq: Once | INTRAVENOUS | Status: AC | PRN
  Administered 2019-11-19: 500 [IU]
  Filled 2019-11-19: qty 5

## 2019-11-19 MED ORDER — SODIUM CHLORIDE 0.9 % IV SOLN
Freq: Once | INTRAVENOUS | Status: AC
  Filled 2019-11-19: qty 250

## 2019-11-19 NOTE — Progress Notes (Signed)
[facility] Telephone:(336) [phone]   Fax:(336) [phone]  OFFICE PROGRESS NOTE  Buzzy Han, MD [place] [address]  DIAGNOSIS: Metastatic non-small cell lung cancer initially diagnosed as stage IIIA (T2a, N2, M0) non-small cell lung cancer, poorly differentiated adenocarcinoma presented with left lower lobe lung mass in addition to mediastinal lymphadenopathy.  The patient was diagnosed with metastatic disease involving the left femur as well as left supraclavicular nodal metastases and right paratracheal lymphadenopathy in October 2018.  Biomarker Findings Microsatellite Status - MS-Stable Tumor Mutational Burden - TMB-Low (3 Muts/Mb) Genomic Findings For a complete list of the genes assayed, please refer to the Appendix. STK11 P221f*6 [id number] site 1456+2B>WDAXX E374* MLL2 [id number] NBN K2338f NOTCH2 [id number] splice site [id number]+7G>ODisease relevant genes with no reportable alterations: EGFR, KRAS, ALK, BRAF, MET, RET, ERBB2, ROS1   PDL1 expression 5%  PRIOR THERAPY: 1) status post wedge resection of the left lower lobe lung mass as well as AP window lymph node dissection but there was residual metastatic mediastinal lymphadenopathy that could not be resected. 2) a course of concurrent chemoradiation [place] carboplatin [place]  3) status post palliative radiotherapy to the left femur metastatic bone disease. 4)  Systemic chemotherapy with carboplatin for AUC of 5, Alimta 500 mg/M2 and Keytruda 200 mg IV every 3 weeks.  First dose March 07, 2017.  Carboplatin was discontinued during cycle #2 secondary to hypersensitivity reaction. 5) status post 2 cycles of maintenance treatment with Alimta and Ketruda (pembrolizumab).  Alimta was discontinued secondary to intolerance.  CURRENT THERAPY: Maintenance treatment with single agent Ketruda (pembrolizumab) status post  43  cycles.  INTERVAL HISTORY: Victor Huber 69o. male returns to the clinic today for follow-up visit.  The patient is feeling fine today with no concerning complaints.  He still dealing with the pancreatitis and followed by Dr. ManRush LandmarkHe has no current nausea, vomiting, diarrhea, abdominal pain or constipation.  He has no chest pain, shortness of breath, cough or hemoptysis.  He has no recent weight loss or night sweats.  He is here today for evaluation before starting cycle #47 of his treatment.  MEDICAL HISTORY: Past Medical History:  Diagnosis Date  . Adenocarcinoma of left lung, stage 3 ([facility]/16/2018  . Arthritis   . Atrial fibrillation ([facility]CC[place]/16/2018  . Atrial fibrillation ([facility]CC[place] . Cancer, metastatic to bone ([facility]CC[place] . COPD (chronic obstructive pulmonary disease) ([facility]CC[facility]/16/2018  . History of chemotherapy   . History of radiation therapy   . Hyperlipidemia   . Hypothyroid 08/15/2016  . Longstanding persistent atrial fibrillation (HCC[place]ondovi/30/2018  . Pathologic fracture    left femur  . Pneumonitis   . S/P TURP 08/15/2016  . Wears glasses   . Wears hearing aid in both ears     ALLERGIES:  is allergic to carboplatin, flecainide, and debrox [carbamide peroxide].  MEDICATIONS:  Current Outpatient Medications  Medication Sig Dispense Refill  . albuterol (VENTOLIN HFA) 108 (90 Base) MCG/ACT inhaler Inhale 1-2 puffs into the lungs every 4 (four) hours as needed for wheezing or shortness of breath. 3 month supply 24 g 3  . atenolol (TENORMIN) 50 MG tablet TAKE 1 TABLET (50 MG TOTAL) BY MOUTH DAILY. 90 tablet 3  . atorvastatin (LIPITOR) 40 MG tablet Take 1 tablet (40 mg total) by mouth daily. 90 tablet 2  . citalopram (CELEXA) 40 MG tablet TAKE  1 TABLET EVERY DAY 90 tablet 1  . CREON 36000-114000 units CPEP capsule 2 capsules 3 times daily with meals and 1 capsule twice daily with snacks 240 capsule 11  . ELIQUIS 5 MG TABS tablet TAKE 1 TABLET TWICE DAILY 180 tablet 1   . ibuprofen (ADVIL,MOTRIN) 400 MG tablet Take 400 mg by mouth every 4 (four) hours as needed for moderate pain.    Marland Kitchen levothyroxine (SYNTHROID) 175 MCG tablet Take 175 mcg by mouth daily before breakfast.    . Oxycodone HCl 10 MG TABS Take one daily as needed for severe back pain. 30 tablet 0  . tamsulosin (FLOMAX) 0.4 MG CAPS capsule Take 1 capsule (0.4 mg total) by mouth daily. 90 capsule 4   No current facility-administered medications for this visit.   Facility-Administered Medications Ordered in Other Visits  Medication Dose Route Frequency Provider Last Rate Last Admin  . sodium chloride flush (NS) 0.9 % injection 10 mL  10 mL Intracatheter PRN Curt Bears, MD   10 mL at 11/19/19 1128    SURGICAL HISTORY:  Past Surgical History:  Procedure Laterality Date  . BRONCHOSCOPY  10/2014  . CARDIAC CATHETERIZATION     05/07/12  . CARDIOVERSION     x2  . COLONOSCOPY    . DG BIOPSY LUNG Left 10/2014   FNA - Adenocarcinoma   . FEMUR IM NAIL Left 02/19/2017  . FEMUR IM NAIL Left 02/19/2017   Procedure: INTRAMEDULLARY (IM) NAIL FEMORAL;  Surgeon: Marchia Bond, MD;  Location: [facility];  Service: Orthopedics;  Laterality: Left;  . IR FLUORO GUIDE PORT INSERTION RIGHT  07/03/2017  . IR US GUIDE VASC ACCESS RIGHT  07/03/2017  . LUNG CANCER SURGERY Left 12/2014   Wedge Resection   . MULTIPLE TOOTH EXTRACTIONS    . Status post TURP    . TONSILLECTOMY      REVIEW OF SYSTEMS:  A comprehensive review of systems was negative except for: Constitutional: positive for fatigue   PHYSICAL EXAMINATION: General appearance: alert, cooperative, fatigued and no distress Head: Normocephalic, without obvious abnormality, atraumatic Neck: no adenopathy, no JVD, supple, symmetrical, trachea midline and thyroid not enlarged, symmetric, no tenderness/mass/nodules Lymph nodes: Cervical, supraclavicular, and axillary nodes normal. Resp: clear to auscultation bilaterally Back: symmetric, no curvature. ROM  normal. No CVA tenderness. Cardio: regular rate and rhythm, S1, S2 normal, no murmur, click, rub or gallop GI: soft, non-tender; bowel sounds normal; no masses,  no organomegaly Extremities: extremities normal, atraumatic, no cyanosis or edema  ECOG PERFORMANCE STATUS: 1 - Symptomatic but completely ambulatory  Blood pressure 101/68, pulse 87, temperature 97.8 F (36.6 C), temperature source Tympanic, resp. rate 17, height 6' 1" (1.854 m), weight 227 lb 9.6 oz (103.2 kg), SpO2 99 %.  LABORATORY DATA: Lab Results  Component Value Date   WBC 6.8 10/29/2019   HGB 13.9 10/29/2019   HCT 39.6 10/29/2019   MCV 95.7 10/29/2019   PLT 147 (L) 10/29/2019      Chemistry      Component Value Date/Time   NA 139 10/29/2019 0937   NA 136 04/04/2017 1141   K 4.0 10/29/2019 0937   K 4.4 04/04/2017 1141   CL 107 10/29/2019 0937   CO2 23 10/29/2019 0937   CO2 24 04/04/2017 1141   BUN 12 10/29/2019 0937   BUN 21.1 04/04/2017 1141   CREATININE 0.84 10/29/2019 0937   CREATININE 1.1 04/04/2017 1141      Component Value Date/Time   CALCIUM 9.6 10/29/2019 1962  CALCIUM 9.1 04/04/2017 1141   ALKPHOS 108 10/29/2019 0937   ALKPHOS 89 04/04/2017 1141   AST 23 10/29/2019 0937   AST 17 04/04/2017 1141   ALT 23 10/29/2019 0937   ALT 18 04/04/2017 1141   BILITOT 0.7 10/29/2019 0937   BILITOT 0.67 04/04/2017 1141       RADIOGRAPHIC STUDIES: No results found.  ASSESSMENT AND PLAN: This is a 69 years old white male with metastatic non-small cell lung cancer, adenocarcinoma with no actionable mutations and PDL 1 expression of 5% that was initially diagnosed as stage IIIa non-small cell lung cancer, adenocarcinoma status post left lower lobectomy with lymph node dissection followed by a course of concurrent chemoradiation completed in January 2016.  The patient had evidence for disease metastasis in October 2018 with metastatic disease to the left femur as well as left supraclavicular and right  paratracheal lymph nodes. The patient is currently on systemic chemotherapy initially was with carboplatin, Alimta and Keytruda.  Carboplatin was discontinued secondary to hypersensitivity reaction starting from cycle #2.   He was also treated with 3 cycles of maintenance Alimta and Ketruda (pembrolizumab) but Alimta was discontinued secondary to intolerance. He is currently undergoing treatment with maintenance Keytruda as a single agent status post 43 cycles.  The patient has been tolerating this treatment with no concerning complaints except for the episode of pancreatitis several weeks ago.  He is currently followed by gastroenterology.  He is asymptomatic at this point. I recommended for him to proceed with cycle #44 of his maintenance treatment with [facility] today as planned. He will come back for follow-up visit in 3 weeks for evaluation before the next cycle of his treatment. The patient is interested in receiving the booster dose of the Covid vaccine and I will check to see if it is available in the clinic today. He was advised to call immediately if he has any concerning symptoms in the interval. The patient voices understanding of current disease status and treatment options and is in agreement with the current care plan. All questions were answered. The patient knows to call the clinic with any problems, questions or concerns. We can certainly see the patient much sooner if necessary.  Disclaimer: This note was dictated with voice recognition software. Similar sounding words can inadvertently be transcribed and may not be corrected upon review.

## 2019-12-03 ENCOUNTER — Encounter: Payer: Self-pay | Admitting: Internal Medicine

## 2019-12-09 ENCOUNTER — Other Ambulatory Visit: Payer: Medicare HMO

## 2019-12-10 ENCOUNTER — Inpatient Hospital Stay: Payer: Medicare HMO

## 2019-12-10 ENCOUNTER — Inpatient Hospital Stay: Payer: Medicare HMO | Attending: Internal Medicine | Admitting: Internal Medicine

## 2019-12-10 ENCOUNTER — Encounter: Payer: Self-pay | Admitting: Internal Medicine

## 2019-12-10 ENCOUNTER — Other Ambulatory Visit: Payer: Self-pay

## 2019-12-10 VITALS — BP 96/68 | HR 58 | Temp 98.7°F | Resp 18 | Ht 73.0 in | Wt 222.7 lb

## 2019-12-10 DIAGNOSIS — C7951 Secondary malignant neoplasm of bone: Secondary | ICD-10-CM

## 2019-12-10 DIAGNOSIS — C3432 Malignant neoplasm of lower lobe, left bronchus or lung: Secondary | ICD-10-CM | POA: Insufficient documentation

## 2019-12-10 DIAGNOSIS — C3492 Malignant neoplasm of unspecified part of left bronchus or lung: Secondary | ICD-10-CM

## 2019-12-10 DIAGNOSIS — Z95828 Presence of other vascular implants and grafts: Secondary | ICD-10-CM

## 2019-12-10 DIAGNOSIS — Z79899 Other long term (current) drug therapy: Secondary | ICD-10-CM | POA: Diagnosis not present

## 2019-12-10 DIAGNOSIS — Z5112 Encounter for antineoplastic immunotherapy: Secondary | ICD-10-CM | POA: Diagnosis present

## 2019-12-10 DIAGNOSIS — C3490 Malignant neoplasm of unspecified part of unspecified bronchus or lung: Secondary | ICD-10-CM

## 2019-12-10 LAB — TSH: TSH: 6.923 u[IU]/mL — ABNORMAL HIGH (ref 0.320–4.118)

## 2019-12-10 LAB — CMP (CANCER CENTER ONLY)
ALT: 22 U/L (ref 0–44)
AST: 20 U/L (ref 15–41)
Albumin: 3.8 g/dL (ref 3.5–5.0)
Alkaline Phosphatase: 111 U/L (ref 38–126)
Anion gap: 7 (ref 5–15)
BUN: 15 mg/dL (ref 8–23)
CO2: 22 mmol/L (ref 22–32)
Calcium: 9.3 mg/dL (ref 8.9–10.3)
Chloride: 111 mmol/L (ref 98–111)
Creatinine: 0.91 mg/dL (ref 0.61–1.24)
GFR, Est AFR Am: >60 mL/min (ref >60)
GFR, Estimated: >60 mL/min (ref >60)
Glucose, Bld: 132 mg/dL — ABNORMAL HIGH (ref 70–99)
Potassium: 4 mmol/L (ref 3.5–5.1)
Sodium: 140 mmol/L (ref 135–145)
Total Bilirubin: 0.8 mg/dL (ref 0.3–1.2)
Total Protein: 6.5 g/dL (ref 6.5–8.1)

## 2019-12-10 LAB — CBC WITH DIFFERENTIAL (CANCER CENTER ONLY)
Abs Immature Granulocytes: 0.02 10*3/uL (ref 0.00–0.07)
Basophils Absolute: 0 10*3/uL (ref 0.0–0.1)
Basophils Relative: 0 %
Eosinophils Absolute: 0.1 10*3/uL (ref 0.0–0.5)
Eosinophils Relative: 2 %
HCT: 40.7 % (ref 39.0–52.0)
Hemoglobin: 14.3 g/dL (ref 13.0–17.0)
Immature Granulocytes: 0 %
Lymphocytes Relative: 21 %
Lymphs Abs: 1.7 10*3/uL (ref 0.7–4.0)
MCH: 33.3 pg (ref 26.0–34.0)
MCHC: 35.1 g/dL (ref 30.0–36.0)
MCV: 94.7 fL (ref 80.0–100.0)
Monocytes Absolute: 0.6 10*3/uL (ref 0.1–1.0)
Monocytes Relative: 7 %
Neutro Abs: 5.8 10*3/uL (ref 1.7–7.7)
Neutrophils Relative %: 70 %
Platelet Count: 165 10*3/uL (ref 150–400)
RBC: 4.3 MIL/uL (ref 4.22–5.81)
RDW: 14.2 % (ref 11.5–15.5)
WBC Count: 8.2 10*3/uL (ref 4.0–10.5)
nRBC: 0 % (ref 0.0–0.2)

## 2019-12-10 MED ORDER — SODIUM CHLORIDE 0.9% FLUSH
10.0000 mL | INTRAVENOUS | Status: DC | PRN
  Administered 2019-12-10: 10 mL
  Filled 2019-12-10: qty 10

## 2019-12-10 MED ORDER — SODIUM CHLORIDE 0.9 % IV SOLN
Freq: Once | INTRAVENOUS | Status: AC
  Filled 2019-12-10: qty 250

## 2019-12-10 MED ORDER — SODIUM CHLORIDE 0.9 % IV SOLN
200.0000 mg | Freq: Once | INTRAVENOUS | Status: AC
  Administered 2019-12-10: 200 mg via INTRAVENOUS
  Filled 2019-12-10: qty 8

## 2019-12-10 MED ORDER — HEPARIN SOD (PORK) LOCK FLUSH 100 UNIT/ML IV SOLN
500.0000 [IU] | Freq: Once | INTRAVENOUS | Status: AC | PRN
  Administered 2019-12-10: 500 [IU]
  Filled 2019-12-10: qty 5

## 2019-12-10 NOTE — Patient Instructions (Signed)
[facility] Discharge Instructions for Patients Receiving Chemotherapy  Today you received the following monoclonal antibody agent Pembrolizumab (KEYTRUDA).  To help prevent nausea and vomiting after your treatment, we encourage you to take your nausea medication as prescribed.   If you develop nausea and vomiting that is not controlled by your nausea medication, call the clinic.   BELOW ARE SYMPTOMS THAT SHOULD BE REPORTED IMMEDIATELY:  *FEVER GREATER THAN 100.5 F  *CHILLS WITH OR WITHOUT FEVER  NAUSEA AND VOMITING THAT IS NOT CONTROLLED WITH YOUR NAUSEA MEDICATION  *UNUSUAL SHORTNESS OF BREATH  *UNUSUAL BRUISING OR BLEEDING  TENDERNESS IN MOUTH AND THROAT WITH OR WITHOUT PRESENCE OF ULCERS  *URINARY PROBLEMS  *BOWEL PROBLEMS  UNUSUAL RASH Items with * indicate a potential emergency and should be followed up as soon as possible.  Feel free to call the clinic should you have any questions or concerns. The clinic phone number is [phone].  Please show the [facility] at check-in to the Emergency Department and triage nurse.

## 2019-12-10 NOTE — Progress Notes (Signed)
[place] [phone]   [phone]  OFFICE PROGRESS NOTE  Buzzy Han, MD [place] [address]  DIAGNOSIS: Metastatic non-small cell lung cancer initially diagnosed as stage IIIA (T2a, N2, M0) non-small cell lung cancer, poorly differentiated adenocarcinoma presented with left lower lobe lung mass in addition to mediastinal lymphadenopathy.  The patient was diagnosed with metastatic disease involving the left femur as well as left supraclavicular nodal metastases and right paratracheal lymphadenopathy in October 2018.  Biomarker Findings Microsatellite Status - MS-Stable Tumor Mutational Burden - TMB-Low (3 Muts/Mb) Genomic Findings For a complete list of the genes assayed, please refer to the Appendix. STK11 P28f*6 CYMEB5ApX09MMHsplice site 1680+8U>PDAXX E374* MLL2 [id number] NBN K23361f NOTCH2 [id number] splice site [id number]+5F>YDisease relevant genes with no reportable alterations: EGFR, KRAS, ALK, BRAF, MET, RET, ERBB2, ROS1   PDL1 expression 5%  PRIOR THERAPY: 1) status post wedge resection of the left lower lobe lung mass as well as AP window lymph node dissection but there was residual metastatic mediastinal lymphadenopathy that could not be resected. 2) a course of concurrent chemoradiation [place] carboplatin [place]  3) status post palliative radiotherapy to the left femur metastatic bone disease. 4)  Systemic chemotherapy with carboplatin for AUC of 5, Alimta 500 mg/M2 and Keytruda 200 mg IV every 3 weeks.  First dose March 07, 2017.  Carboplatin was discontinued during cycle #2 secondary to hypersensitivity reaction. 5) status post 2 cycles of maintenance treatment with Alimta and Ketruda (pembrolizumab).  Alimta was discontinued secondary to intolerance.  CURRENT THERAPY: Maintenance treatment with single agent Ketruda (pembrolizumab) status post  44  cycles.  INTERVAL HISTORY: Victor Huber 10o. male returns to the clinic today for follow-up visit.  The patient is feeling fine today with no concerning complaints except for fatigue and occasional bloating and 2-3 episodes of diarrhea few days a week.  He denied having any nausea, vomiting or constipation.  He denied having any chest pain, shortness of breath, cough or hemoptysis.  He has no weight loss or night sweats.  He has no headache or visual changes.  He is scheduled for upper endoscopy the end of this month by his gastroenterologist.  The patient is here today for evaluation before starting cycle #45 of his treatment.  MEDICAL HISTORY: Past Medical History:  Diagnosis Date  . Adenocarcinoma of left lung, stage 3 ([facility] [place]/16/2018  . Arthritis   . Atrial fibrillation ([facility] [place]/16/2018  . Atrial fibrillation ([place] . Cancer, metastatic to bone ([facility] . COPD (chronic obstructive pulmonary disease) ([facility] [place]/16/2018  . History of chemotherapy   . History of radiation therapy   . Hyperlipidemia   . Hypothyroid 08/15/2016  . Longstanding persistent atrial fibrillation ([facility]/30/2018  . Pathologic fracture    left femur  . Pneumonitis   . S/P TURP 08/15/2016  . Wears glasses   . Wears hearing aid in both ears     ALLERGIES:  is allergic to carboplatin, flecainide, and debrox [carbamide peroxide].  MEDICATIONS:  Current Outpatient Medications  Medication Sig Dispense Refill  . albuterol (VENTOLIN HFA) 108 (90 Base) MCG/ACT inhaler Inhale 1-2 puffs into the lungs every 4 (four) hours as needed for wheezing or shortness of breath. 3 month supply 24 g 3  . atenolol (TENORMIN) 50 MG tablet TAKE 1 TABLET (50 MG TOTAL) BY MOUTH DAILY. 90 tablet 3  . atorvastatin (  LIPITOR) 40 MG tablet Take 1 tablet (40 mg total) by mouth daily. 90 tablet 2  . buPROPion (WELLBUTRIN) 75 MG tablet Take 1 tablet by mouth daily.    . citalopram (CELEXA) 40 MG tablet TAKE 1 TABLET EVERY DAY 90 tablet  1  . CREON 36000-114000 units CPEP capsule 2 capsules 3 times daily with meals and 1 capsule twice daily with snacks 240 capsule 11  . ELIQUIS 5 MG TABS tablet TAKE 1 TABLET TWICE DAILY 180 tablet 1  . ibuprofen (ADVIL,MOTRIN) 400 MG tablet Take 400 mg by mouth every 4 (four) hours as needed for moderate pain.    Marland Kitchen levothyroxine (SYNTHROID) 175 MCG tablet Take 175 mcg by mouth daily before breakfast.    . Oxycodone HCl 10 MG TABS Take one daily as needed for severe back pain. 30 tablet 0  . tamsulosin (FLOMAX) 0.4 MG CAPS capsule Take 1 capsule (0.4 mg total) by mouth daily. 90 capsule 4   No current facility-administered medications for this visit.   Facility-Administered Medications Ordered in Other Visits  Medication Dose Route Frequency Provider Last Rate Last Admin  . sodium chloride flush (NS) 0.9 % injection 10 mL  10 mL Intracatheter PRN Curt Bears, MD   10 mL at 12/10/19 7124    SURGICAL HISTORY:  Past Surgical History:  Procedure Laterality Date  . BRONCHOSCOPY  10/2014  . CARDIAC CATHETERIZATION     05/07/12  . CARDIOVERSION     x2  . COLONOSCOPY    . DG BIOPSY LUNG Left 10/2014   FNA - Adenocarcinoma   . FEMUR IM NAIL Left 02/19/2017  . FEMUR IM NAIL Left 02/19/2017   Procedure: INTRAMEDULLARY (IM) NAIL FEMORAL;  Surgeon: Marchia Bond, MD;  Location: [facility];  Service: Orthopedics;  Laterality: Left;  . IR FLUORO GUIDE PORT INSERTION RIGHT  07/03/2017  . IR US GUIDE VASC ACCESS RIGHT  07/03/2017  . LUNG CANCER SURGERY Left 12/2014   Wedge Resection   . MULTIPLE TOOTH EXTRACTIONS    . Status post TURP    . TONSILLECTOMY      REVIEW OF SYSTEMS:  A comprehensive review of systems was negative except for: Constitutional: positive for fatigue Gastrointestinal: positive for diarrhea   PHYSICAL EXAMINATION: General appearance: alert, cooperative, fatigued and no distress Head: Normocephalic, without obvious abnormality, atraumatic Neck: no adenopathy, no JVD, supple,  symmetrical, trachea midline and thyroid not enlarged, symmetric, no tenderness/mass/nodules Lymph nodes: Cervical, supraclavicular, and axillary nodes normal. Resp: clear to auscultation bilaterally Back: symmetric, no curvature. ROM normal. No CVA tenderness. Cardio: regular rate and rhythm, S1, S2 normal, no murmur, click, rub or gallop GI: soft, non-tender; bowel sounds normal; no masses,  no organomegaly Extremities: extremities normal, atraumatic, no cyanosis or edema  ECOG PERFORMANCE STATUS: 1 - Symptomatic but completely ambulatory  Blood pressure 96/68, pulse (!) 58, temperature 98.7 F (37.1 C), temperature source Tympanic, resp. rate 18, height 6' 1"  (1.854 m), weight 222 lb 11.2 oz (101 kg), SpO2 96 %.  LABORATORY DATA: Lab Results  Component Value Date   WBC 8.2 12/10/2019   HGB 14.3 12/10/2019   HCT 40.7 12/10/2019   MCV 94.7 12/10/2019   PLT 165 12/10/2019      Chemistry      Component Value Date/Time   NA 139 11/19/2019 1128   NA 136 04/04/2017 1141   K 4.0 11/19/2019 1128   K 4.4 04/04/2017 1141   CL 107 11/19/2019 1128   CO2 25 11/19/2019 1128  CO2 24 04/04/2017 1141   BUN 12 11/19/2019 1128   BUN 21.1 04/04/2017 1141   CREATININE 0.80 11/19/2019 1128   CREATININE 1.1 04/04/2017 1141      Component Value Date/Time   CALCIUM 9.5 11/19/2019 1128   CALCIUM 9.1 04/04/2017 1141   ALKPHOS 103 11/19/2019 1128   ALKPHOS 89 04/04/2017 1141   AST 15 11/19/2019 1128   AST 17 04/04/2017 1141   ALT 17 11/19/2019 1128   ALT 18 04/04/2017 1141   BILITOT 0.7 11/19/2019 1128   BILITOT 0.67 04/04/2017 1141       RADIOGRAPHIC STUDIES: No results found.  ASSESSMENT AND PLAN: This is a 69 years old white male with metastatic non-small cell lung cancer, adenocarcinoma with no actionable mutations and PDL 1 expression of 5% that was initially diagnosed as stage IIIa non-small cell lung cancer, adenocarcinoma status post left lower lobectomy with lymph node  dissection followed by a course of concurrent chemoradiation completed in January 2016.  The patient had evidence for disease metastasis in October 2018 with metastatic disease to the left femur as well as left supraclavicular and right paratracheal lymph nodes. The patient is currently on systemic chemotherapy initially was with carboplatin, Alimta and Keytruda.  Carboplatin was discontinued secondary to hypersensitivity reaction starting from cycle #2.   He was also treated with 3 cycles of maintenance Alimta and Ketruda (pembrolizumab) but Alimta was discontinued secondary to intolerance. He is currently undergoing treatment with maintenance Keytruda as a single agent status post 44 cycles.  The patient continues to tolerate his treatment with [facility] fairly well. I recommended for him to proceed with cycle #45 today as planned. For the suspicious pancreatitis is followed by gastroenterology and expected to have upper endoscopy and evaluation by the end of the month. The patient will come back for follow-up visit in 3 weeks for evaluation before the next cycle of his treatment. He was advised to call immediately if he has any concerning symptoms in the interval. The patient voices understanding of current disease status and treatment options and is in agreement with the current care plan. All questions were answered. The patient knows to call the clinic with any problems, questions or concerns. We can certainly see the patient much sooner if necessary.  Disclaimer: This note was dictated with voice recognition software. Similar sounding words can inadvertently be transcribed and may not be corrected upon review.

## 2019-12-10 NOTE — Patient Instructions (Signed)

## 2019-12-12 ENCOUNTER — Other Ambulatory Visit (HOSPITAL_COMMUNITY): Payer: Medicare HMO

## 2019-12-17 ENCOUNTER — Other Ambulatory Visit (HOSPITAL_COMMUNITY)
Admission: RE | Admit: 2019-12-17 | Discharge: 2019-12-17 | Disposition: A | Discharge status: Home / Self Care | Payer: Medicare HMO | Source: Ambulatory Visit | Attending: Gastroenterology | Admitting: Gastroenterology

## 2019-12-17 DIAGNOSIS — Z20822 Contact with and (suspected) exposure to covid-19: Secondary | ICD-10-CM | POA: Diagnosis not present

## 2019-12-17 DIAGNOSIS — Z01812 Encounter for preprocedural laboratory examination: Secondary | ICD-10-CM | POA: Diagnosis present

## 2019-12-17 LAB — SARS CORONAVIRUS 2 (TAT 6-24 HRS): SARS Coronavirus 2: NEGATIVE

## 2019-12-18 ENCOUNTER — Other Ambulatory Visit: Payer: Self-pay

## 2019-12-18 ENCOUNTER — Encounter (HOSPITAL_COMMUNITY): Payer: Self-pay | Admitting: Gastroenterology

## 2019-12-18 NOTE — Anesthesia Preprocedure Evaluation (Addendum)
Anesthesia Evaluation  Patient identified by MRN, date of birth, ID band  Reviewed: Patient's Chart, lab work & pertinent test results  Airway Mallampati: II  TM Distance: >3 FB Neck ROM: Full    Dental  (+) Teeth Intact   Pulmonary COPD, former smoker,    Pulmonary exam normal        Cardiovascular hypertension, + dysrhythmias Atrial Fibrillation  Rhythm:Irregular Rate:Normal     Neuro/Psych Anxiety Depression negative neurological ROS     GI/Hepatic Neg liver ROS, Pancreatitis, dysphagia   Endo/Other  Hypothyroidism   Renal/GU negative Renal ROS  negative genitourinary   Musculoskeletal  (+) Arthritis ,   Abdominal (+)  Abdomen: soft. Bowel sounds: normal.  Peds  Hematology negative hematology ROS (+)   Anesthesia Other Findings   Reproductive/Obstetrics                           Anesthesia Physical Anesthesia Plan  ASA: III  Anesthesia Plan: MAC   Post-op Pain Management:    Induction:   PONV Risk Score and Plan: 1 and Ondansetron, Dexamethasone and Propofol infusion  Airway Management Planned: Nasal Cannula and Simple Face Mask  Additional Equipment: None  Intra-op Plan:   Post-operative Plan:   Informed Consent: I have reviewed the patients History and Physical, chart, labs and discussed the procedure including the risks, benefits and alternatives for the proposed anesthesia with the patient or authorized representative who has indicated his/her understanding and acceptance.     Dental advisory given  Plan Discussed with:   Anesthesia Plan Comments: (PAT note by Karoline Caldwell, PA-C: Follows with cardiology for hx of persistent atrial fibrillation on Eliquis. Cardiac clearance per telephone encounter 10/13/19, "Per pharmacy recommendations, patient can hold eliquis 1-2 day prior to his upcoming EUS with plans to restart as soon as he is cleared to do so by his  gastroenterologist."  Follows with pulmonology and oncology for hx of COPD metastatic non-small cell lung cancer s/p resection and on chemotherapy with Keytruda.  He is on albuterol as needed.  Patient reported last dose Eliquis 12/19/2019.  Labs from 12/10/2019 reviewed, unremarkable.  EKG 03/19/2019: A. fib with rapid ventricular response with premature ventricular or aberrantly conducted complexes.  Rate 104.  Incomplete right bundle block.  Possible RVH.  Nonspecific ST abnormality.  TTE 01/02/2016: Normal left ventricular chamber size. Mildly reduced left ventricular systolic function. Very mild hypokinesis. Left ventricular ejection fraction is 45 to 50% visually. Enlarged right ventricular size. Normal right ventricular systolic function. Moderately dilated right atrium. Mildly dilated left atrium. Normal left atrial appendage. Normal inter atrial septum. Bubble study performed with no sign of shunt. Trace mitral regurgitation. Trace aortic insufficiency. Trace tricuspid regurgitation. Normal pericardium without effusion. Normal aortic root. No cardiac source of embolism seen.  LHC 05/07/12: Conclusion: 1.  Nonobstructive coronary atherosclerosis.   )       Anesthesia Quick Evaluation

## 2019-12-18 NOTE — Progress Notes (Signed)
Anesthesia Chart Review: Same-day work-up  Follows with cardiology for hx of persistent atrial fibrillation on Eliquis. Cardiac clearance per telephone encounter 10/13/19, "Per pharmacy recommendations, patient can hold eliquis 1-2 day prior to his upcoming EUS with plans to restart as soon as he is cleared to do so by his gastroenterologist."  Follows with pulmonology and oncology for hx of COPD metastatic non-small cell lung cancer s/p resection and on chemotherapy with Keytruda.  He is on albuterol as needed.  Patient reported last dose Eliquis 12/19/2019.  Labs from 12/10/2019 reviewed, unremarkable.  EKG 03/19/2019: A. fib with rapid ventricular response with premature ventricular or aberrantly conducted complexes.  Rate 104.  Incomplete right bundle block.  Possible RVH.  Nonspecific ST abnormality.  TTE 01/02/2016: Normal left ventricular chamber size. Mildly reduced left ventricular systolic function. Very mild hypokinesis. Left ventricular ejection fraction is 45 to 50% visually. Enlarged right ventricular size. Normal right ventricular systolic function. Moderately dilated right atrium. Mildly dilated left atrium. Normal left atrial appendage. Normal inter atrial septum. Bubble study performed with no sign of shunt. Trace mitral regurgitation. Trace aortic insufficiency. Trace tricuspid regurgitation. Normal pericardium without effusion. Normal aortic root. No cardiac source of embolism seen.  LHC 05/07/12: Conclusion: 1.  Nonobstructive coronary atherosclerosis.   Wynonia Musty [facility]/Anesthesiology Phone [phone] 12/18/2019 4:10 PM

## 2019-12-18 NOTE — Progress Notes (Addendum)
Spoke with patient's wife Victor Huber for [employer].  PCP - [facility] with Dr "O"  Cardiologist - Dr Seward Carol - Dr [facility] Oncology - Dr Julien Nordmann Pulmonology - Dr Baltazar Apo  Chest x-ray - n/a, CT Chest 10/07/19 EKG - 03/19/19 Stress Test - 05/12/12  ECHO - 01/02/16 Cardiac Cath - 05/07/12  Blood Thinner Instructions:  Follow your surgeon's instructions on when to stop prior to surgery.  Per Md, last dose of eliquis will be on Sat 12/19/19.  Anesthesia review: Yes  STOP now taking any Aspirin (unless otherwise instructed by your surgeon), Aleve, Naproxen, Ibuprofen, Motrin, Advil, Goody's, BC's, all herbal medications, fish oil, and all vitamins.   Coronavirus Screening Covid test on 12/17/19 was negative.  Wife Victor Huber verbalized understanding of instructions that were given via phone.

## 2019-12-21 ENCOUNTER — Ambulatory Visit (HOSPITAL_COMMUNITY): Payer: Medicare HMO | Admitting: Physician Assistant

## 2019-12-21 ENCOUNTER — Other Ambulatory Visit: Payer: Self-pay

## 2019-12-21 ENCOUNTER — Encounter (HOSPITAL_COMMUNITY): Payer: Self-pay | Admitting: Gastroenterology

## 2019-12-21 ENCOUNTER — Other Ambulatory Visit: Payer: Self-pay | Admitting: Physician Assistant

## 2019-12-21 ENCOUNTER — Encounter (HOSPITAL_COMMUNITY)
Admission: RE | Disposition: A | Discharge status: Home / Self Care | Payer: Self-pay | Source: Home / Self Care | Attending: Gastroenterology

## 2019-12-21 ENCOUNTER — Ambulatory Visit (HOSPITAL_COMMUNITY)
Admission: RE | Admit: 2019-12-21 | Discharge: 2019-12-21 | Disposition: A | Discharge status: Home / Self Care | Payer: Medicare HMO | Attending: Gastroenterology | Admitting: Gastroenterology

## 2019-12-21 DIAGNOSIS — M1990 Unspecified osteoarthritis, unspecified site: Secondary | ICD-10-CM | POA: Insufficient documentation

## 2019-12-21 DIAGNOSIS — K449 Diaphragmatic hernia without obstruction or gangrene: Secondary | ICD-10-CM | POA: Insufficient documentation

## 2019-12-21 DIAGNOSIS — I2510 Atherosclerotic heart disease of native coronary artery without angina pectoris: Secondary | ICD-10-CM | POA: Insufficient documentation

## 2019-12-21 DIAGNOSIS — Z9079 Acquired absence of other genital organ(s): Secondary | ICD-10-CM | POA: Diagnosis not present

## 2019-12-21 DIAGNOSIS — K269 Duodenal ulcer, unspecified as acute or chronic, without hemorrhage or perforation: Secondary | ICD-10-CM | POA: Insufficient documentation

## 2019-12-21 DIAGNOSIS — Z923 Personal history of irradiation: Secondary | ICD-10-CM | POA: Insufficient documentation

## 2019-12-21 DIAGNOSIS — J449 Chronic obstructive pulmonary disease, unspecified: Secondary | ICD-10-CM | POA: Diagnosis not present

## 2019-12-21 DIAGNOSIS — R6881 Early satiety: Secondary | ICD-10-CM | POA: Insufficient documentation

## 2019-12-21 DIAGNOSIS — I10 Essential (primary) hypertension: Secondary | ICD-10-CM | POA: Insufficient documentation

## 2019-12-21 DIAGNOSIS — Z79899 Other long term (current) drug therapy: Secondary | ICD-10-CM | POA: Diagnosis not present

## 2019-12-21 DIAGNOSIS — C7951 Secondary malignant neoplasm of bone: Secondary | ICD-10-CM | POA: Diagnosis not present

## 2019-12-21 DIAGNOSIS — Z87891 Personal history of nicotine dependence: Secondary | ICD-10-CM | POA: Insufficient documentation

## 2019-12-21 DIAGNOSIS — I4819 Other persistent atrial fibrillation: Secondary | ICD-10-CM | POA: Diagnosis not present

## 2019-12-21 DIAGNOSIS — K2980 Duodenitis without bleeding: Secondary | ICD-10-CM | POA: Insufficient documentation

## 2019-12-21 DIAGNOSIS — K2091 Esophagitis, unspecified with bleeding: Secondary | ICD-10-CM | POA: Insufficient documentation

## 2019-12-21 DIAGNOSIS — K7689 Other specified diseases of liver: Secondary | ICD-10-CM | POA: Diagnosis not present

## 2019-12-21 DIAGNOSIS — K863 Pseudocyst of pancreas: Secondary | ICD-10-CM | POA: Insufficient documentation

## 2019-12-21 DIAGNOSIS — K859 Acute pancreatitis without necrosis or infection, unspecified: Secondary | ICD-10-CM | POA: Insufficient documentation

## 2019-12-21 DIAGNOSIS — C3492 Malignant neoplasm of unspecified part of left bronchus or lung: Secondary | ICD-10-CM | POA: Diagnosis not present

## 2019-12-21 DIAGNOSIS — K3189 Other diseases of stomach and duodenum: Secondary | ICD-10-CM | POA: Diagnosis not present

## 2019-12-21 DIAGNOSIS — K862 Cyst of pancreas: Secondary | ICD-10-CM | POA: Diagnosis not present

## 2019-12-21 DIAGNOSIS — R1013 Epigastric pain: Secondary | ICD-10-CM

## 2019-12-21 DIAGNOSIS — K8020 Calculus of gallbladder without cholecystitis without obstruction: Secondary | ICD-10-CM | POA: Diagnosis not present

## 2019-12-21 HISTORY — PX: BIOPSY: SHX5522

## 2019-12-21 HISTORY — DX: Dysphagia, unspecified: R13.10

## 2019-12-21 HISTORY — PX: ESOPHAGOGASTRODUODENOSCOPY (EGD) WITH PROPOFOL: SHX5813

## 2019-12-21 HISTORY — DX: Anxiety disorder, unspecified: F41.9

## 2019-12-21 HISTORY — PX: EUS: SHX5427

## 2019-12-21 HISTORY — DX: Cardiac arrhythmia, unspecified: I49.9

## 2019-12-21 HISTORY — DX: Depression, unspecified: F32.A

## 2019-12-21 SURGERY — UPPER ENDOSCOPIC ULTRASOUND (EUS) RADIAL
Anesthesia: Monitor Anesthesia Care

## 2019-12-21 MED ORDER — PROPOFOL 10 MG/ML IV BOLUS
INTRAVENOUS | Status: DC | PRN
  Administered 2019-12-21 (×2): 20 mg via INTRAVENOUS

## 2019-12-21 MED ORDER — FENTANYL CITRATE (PF) 100 MCG/2ML IJ SOLN: 25.0000 ug | INTRAMUSCULAR | Status: DC | PRN

## 2019-12-21 MED ORDER — ACETAMINOPHEN 160 MG/5ML PO SOLN: 325.0000 mg | ORAL | Status: DC | PRN

## 2019-12-21 MED ORDER — PHENYLEPHRINE 40 MCG/ML (10ML) SYRINGE FOR IV PUSH (FOR BLOOD PRESSURE SUPPORT)
PREFILLED_SYRINGE | INTRAVENOUS | Status: DC | PRN
  Administered 2019-12-21 (×4): 80 ug via INTRAVENOUS

## 2019-12-21 MED ORDER — ACETAMINOPHEN 325 MG PO TABS: 325.0000 mg | ORAL_TABLET | ORAL | Status: DC | PRN

## 2019-12-21 MED ORDER — LIDOCAINE 2% (20 MG/ML) 5 ML SYRINGE
INTRAMUSCULAR | Status: DC | PRN
  Administered 2019-12-21: 40 mg via INTRAVENOUS

## 2019-12-21 MED ORDER — ONDANSETRON HCL 4 MG/2ML IJ SOLN: 4.0000 mg | Freq: Once | INTRAMUSCULAR | Status: DC | PRN

## 2019-12-21 MED ORDER — PROPOFOL 500 MG/50ML IV EMUL
INTRAVENOUS | Status: DC | PRN
  Administered 2019-12-21: 150 ug/kg/min via INTRAVENOUS

## 2019-12-21 MED ORDER — LACTATED RINGERS IV SOLN
INTRAVENOUS | Status: DC
  Administered 2019-12-21: 1000 mL via INTRAVENOUS

## 2019-12-21 MED ORDER — PANTOPRAZOLE SODIUM 40 MG PO TBEC: 40.0000 mg | DELAYED_RELEASE_TABLET | Freq: Two times a day (BID) | ORAL | 4 refills | Status: DC

## 2019-12-21 MED ORDER — SODIUM CHLORIDE 0.9 % IV SOLN: INTRAVENOUS | Status: DC

## 2019-12-21 SURGICAL SUPPLY — 14 items

## 2019-12-21 NOTE — Anesthesia Procedure Notes (Signed)
Procedure Name: MAC Date/Time: 12/21/2019 7:43 AM Performed by: Amadeo Garnet, CRNA Pre-anesthesia Checklist: Patient identified, Emergency Drugs available, Suction available and Patient being monitored Patient Re-evaluated:Patient Re-evaluated prior to induction Oxygen Delivery Method: Nasal cannula Preoxygenation: Pre-oxygenation with 100% oxygen Induction Type: IV induction Placement Confirmation: positive ETCO2 Dental Injury: Teeth and Oropharynx as per pre-operative assessment

## 2019-12-21 NOTE — Op Note (Signed)
[facility] Patient Name: Victor Huber Procedure Date : 12/21/2019 MRN: [id number] Attending MD: Justice Britain , MD Date of Birth: 22-Apr-1950 CSN: [id number] Age: 69 Admit Type: Outpatient Procedure:                Upper EUS Indications:              Pancreatic cyst on MRCP, Acute pancreatitis, Early                            satiety, Weight loss Providers:                Justice Britain, MD, Burtis Junes, RN, Cletis Athens,                            Technician Referring MD:              Medicines:                Monitored Anesthesia Care Complications:            No immediate complications. Estimated Blood Loss:     Estimated blood loss was minimal. Procedure:                Pre-Anesthesia Assessment:                           - Prior to the procedure, a History and Physical                            was performed, and patient medications and                            allergies were reviewed. The patient's tolerance of                            previous anesthesia was also reviewed. The risks                            and benefits of the procedure and the sedation                            options and risks were discussed with the patient.                            All questions were answered, and informed consent                            was obtained. Prior Anticoagulants: The patient has                            taken Eliquis (apixaban), last dose was 2 days                            prior to procedure. ASA Grade Assessment: III - A  patient with severe systemic disease. After                            reviewing the risks and benefits, the patient was                            deemed in satisfactory condition to undergo the                            procedure.                           After obtaining informed consent, the endoscope was                            passed under direct vision. Throughout the                             procedure, the patient's blood pressure, pulse, and                            oxygen saturations were monitored continuously. The                            [id number]-H190 ([id number]) Olympus gastroscope was                            introduced through the mouth, and advanced to the                            second part of duodenum. The [id number]-Q180V ([id number])                            [place] was introduced through the                            mouth, and advanced to the second part of duodenum.                            The GF-[id number] ([id number]) Olympus Linear EUS scope                            was introduced through the mouth, and advanced to                            the duodenum for ultrasound examination from the                            stomach and duodenum. The upper EUS was                            accomplished without difficulty. The patient  tolerated the procedure. Scope In: Scope Out: Findings:      ENDOSCOPIC FINDING: :      No gross lesions were noted in the proximal esophagus.      LA Grade D (one or more mucosal breaks involving at least 75% of       esophageal circumference) esophagitis with bleeding was found 25 to 35       cm from the incisors. There were some areas that looked to be healing       scars but also some areas with deeper ulceration and nodularity.       Biopsies were taken with a cold forceps for histology from different       regions of the esophagus.      The Z-line was regular and was found 40 cm from the incisors.      A 4 cm hiatal hernia was present.      Striped mildly erythematous mucosa without bleeding was found in the       gastric antrum.      No other gross lesions were noted in the entire examined stomach.       Biopsies were taken with a cold forceps for histology and Helicobacter       pylori testing.      A single medium nodule was found in the duodenal bulb. Biopsies were       taken  with a cold forceps for histology. May be healing ulcer.      A scar from a healing ulcer was found in the duodenal bulb.      Multiple erosions without bleeding were found in the duodenal bulb, in       the first portion of the duodenum and in the second portion of the       duodenum.      Mild inflammation characterized by granularity and mucus was found on       the ampulla, though it looked relatively normal. Biopsies were taken       with a cold forceps for histology to rule out adenomatous change.      ENDOSONOGRAPHIC FINDING: :      Pancreatic parenchymal abnormalities were noted in the entire pancreas.       These consisted of hyperechoic foci with shadowing, lobularity with       honeycombing and a cyst as noted below.      An anechoic lesion suggestive of a cyst was identified in the pancreatic       head. It does not communicate with the pancreatic duct. The lesion       measured 5.3 mm by 4.0 mm in maximal cross-sectional diameter. There was       a single compartment The outer wall of the lesion was thin. There was no       associated mass. There was no internal debris within the fluid-filled       cavity.      The pancreatic duct had a normal endosonographic appearance in the       pancreatic head (0.7 mm), genu of the pancreas (0.7 mm), body of the       pancreas (0.5 mm) and tail of the pancreas (0.7 mm).      There was no sign of significant endosonographic abnormality in the       common bile duct (3.8 mm) and in the common hepatic duct (6.1 mm).      Many stones were visualized endosonographically  in the gallbladder. The       stones were round. They were hyperechoic and characterized by shadowing.      A cyst was found in the visualized portion of the liver and measured 6.7       mm by 3.3 mm in maximal cross-sectional diameter. The cyst was anechoic.      Endosonographic imaging in the visualized portion of the liver showed no       other mass-lesions.      No  malignant-appearing lymph nodes were visualized in the celiac region       (level 20), perigastric region, peripancreatic region and porta hepatis       region.      The celiac region was visualized. Impression:               EGD Impression:                           - No gross lesions in esophagus proximally.                           - LA Grade D esophagitis with bleeding in the                            middle and distal esophagus with some nodularity                            and some deep ulcerations. Biopsied.                           - Z-line regular, 40 cm from the incisors.                           - 4 cm hiatal hernia.                           - Erythematous mucosa in the antrum. Biopsied.                           - Nodule found in the duodenum. Biopsied.                           - Duodenal scar in setting of a healing ulcer..                           - Duodenal erosions without bleeding.                           - Mild inflammation noted on the ampulla. Biopsied.                           EUS Impression:                           - Pancreatic parenchymal abnormalities consisting                            of hyperechoic foci,  lobularity with honeycombing                            and a cyst were noted in the entire pancreas. This                            meets EUS criteria Major A + Major B for diagnosis                            of likely Chronic Pancreatitis                           - A cystic lesion was seen in the pancreatic head.                            The diagnosis is a pancreatic pseudocyst.                           - The pancreatic duct had a normal endosonographic                            appearance in the pancreatic head, genu of the                            pancreas, body of the pancreas and tail of the                            pancreas.                           - There was no sign of significant pathology in the                             common bile duct and in the common hepatic duct.                           - Many stones were visualized endosonographically                            in the gallbladder.                           - A cyst was found in the visualized portion of the                            liver but no other significant findings noted.                           - No malignant-appearing lymph nodes were                            visualized in the celiac region (level 20),  perigastric region, peripancreatic region and porta                            hepatis region. Recommendation:           - The patient will be observed post-procedure,                            until all discharge criteria are met.                           - Discharge patient to home.                           - Patient has a contact number available for                            emergencies. The signs and symptoms of potential                            delayed complications were discussed with the                            patient. Return to normal activities tomorrow.                            Written discharge instructions were provided to the                            patient.                           - Low fat diet.                           - Observe patient's clinical course.                           - Await path results.                           - Restart Eliquis in 48 hours (9/22 AM) to decrease                            risk of post-intervention bleeding.                           - Start Protonix 40 mg twice daily (take 30 minutes                            before breakfast/dinner).                           - Repeat EGD in 2-3 months to check on healing and  hopefully then start decreasing PPI therapy.                           - Recommend obtaining a Fecal Elastase in regards                            to documenting likely Exocrine Pancreatic                             Insufficiency as a potential cause of his diarrhea.                           - We will increase PERT to Creon 36K x 4 capsules                            QAC + 36000 x2 capsules QSnack. Will proceed with                            ordering after biopsies return                           - The findings and recommendations were discussed                            with the patient.                           - The findings and recommendations were discussed                            with the patient's family. Procedure Code(s):        --- Professional ---                           [id number], Esophagogastroduodenoscopy, flexible,                            transoral; with endoscopic ultrasound examination                            limited to the esophagus, stomach or duodenum, and                            adjacent structures                           43239, Esophagogastroduodenoscopy, flexible,                            transoral; with biopsy, single or multiple Diagnosis Code(s):        --- Professional ---                           K20.91, Esophagitis, unspecified with bleeding  K44.9, Diaphragmatic hernia without obstruction or                            gangrene                           K31.89, Other diseases of stomach and duodenum                           K26.9, Duodenal ulcer, unspecified as acute or                            chronic, without hemorrhage or perforation                           K29.80, Duodenitis without bleeding                           K86.9, Disease of pancreas, unspecified                           K86.2, Cyst of pancreas                           K86.3, Pseudocyst of pancreas                           K80.20, Calculus of gallbladder without                            cholecystitis without obstruction                           K76.89, Other specified diseases of liver                           I89.9, Noninfective disorder  of lymphatic vessels                            and lymph nodes, unspecified                           K85.90, Acute pancreatitis without necrosis or                            infection, unspecified                           R68.81, Early satiety                           R63.4, Abnormal weight loss CPT copyright 2019 American Medical Association. All rights reserved. The codes documented in this report are preliminary and upon coder review may  be revised to meet current compliance requirements. Justice Britain, MD 12/21/2019 8:57:30 AM Number of Addenda: 0

## 2019-12-21 NOTE — Anesthesia Postprocedure Evaluation (Signed)
Anesthesia Post Note  Patient: Victor Huber  Procedure(s) Performed: UPPER ENDOSCOPIC ULTRASOUND (EUS) RADIAL (N/A ) ESOPHAGOGASTRODUODENOSCOPY (EGD) WITH PROPOFOL (N/A ) BIOPSY     Patient location during evaluation: PACU Anesthesia Type: MAC Level of consciousness: awake and alert Pain management: pain level controlled Vital Signs Assessment: post-procedure vital signs reviewed and stable Respiratory status: spontaneous breathing, nonlabored ventilation, respiratory function stable and patient connected to nasal cannula oxygen Cardiovascular status: stable and blood pressure returned to baseline Postop Assessment: no apparent nausea or vomiting Anesthetic complications: no   No complications documented.  Last Vitals:  Vitals:   12/21/19 0900 12/21/19 0906  BP: 94/65 103/75  Pulse: (!) 108 86  Resp: (!) 21 (!) 23  Temp:  (!) (P) 36.1 C  SpO2: 99% 100%    Last Pain:  Vitals:   12/21/19 0900  TempSrc:   PainSc: 0-No pain                 Belenda Cruise P Birch Farino

## 2019-12-21 NOTE — H&P (Signed)
GASTROENTEROLOGY PROCEDURE H&P NOTE   Primary Care Physician: Buzzy Han, MD  HPI: Victor Huber is a 68 y.o. male who presents for EUS/EGD for evaluation of idiopathic pancreatitis and some persistent early satiety - weight loss has stabilized.  Past Medical History:  Diagnosis Date  . Adenocarcinoma of left lung, stage 3 ([place]) 08/15/2016  . Anxiety   . Arthritis   . Atrial fibrillation ([place]) 08/15/2016  . Atrial fibrillation ([place])   . Cancer, metastatic to bone ([place])   . COPD (chronic obstructive pulmonary disease) ([place]) 08/15/2016  . Depression   . Dysphagia   . Dysrhythmia    a-fib  . History of chemotherapy   . History of radiation therapy   . Hyperlipidemia   . Hypothyroid 08/15/2016  . Longstanding persistent atrial fibrillation ([place]) 08/29/2016  . Pathologic fracture    left femur  . Pneumonitis   . S/P TURP 08/15/2016  . Wears glasses   . Wears hearing aid in both ears    Past Surgical History:  Procedure Laterality Date  . BRONCHOSCOPY  10/2014  . CARDIAC CATHETERIZATION     05/07/12  . CARDIOVERSION     x2  . COLONOSCOPY    . DG BIOPSY LUNG Left 10/2014   FNA - Adenocarcinoma   . FEMUR IM NAIL Left 02/19/2017  . FEMUR IM NAIL Left 02/19/2017   Procedure: INTRAMEDULLARY (IM) NAIL FEMORAL;  Surgeon: Marchia Bond, MD;  Location: [facility];  Service: Orthopedics;  Laterality: Left;  . IR FLUORO GUIDE PORT INSERTION RIGHT  07/03/2017  . IR US GUIDE VASC ACCESS RIGHT  07/03/2017  . LUNG CANCER SURGERY Left 12/2014   Wedge Resection   . MULTIPLE TOOTH EXTRACTIONS    . Status post TURP    . TONSILLECTOMY     Current Facility-Administered Medications  Medication Dose Route Frequency Provider Last Rate Last Admin  . 0.9 %  sodium chloride infusion   Intravenous Continuous [name], Telford Nab., MD      . lactated ringers infusion   Intravenous Continuous [name], Telford Nab., MD 10 mL/hr at 12/21/19 0648 1,000 mL at 12/21/19 0648   Allergies   Allergen Reactions  . Carboplatin Itching, Nausea And Vomiting and Other (See Comments)    Flushing  . Flecainide Hypertension    CAUSED HEART ISSUES   . Debrox [Carbamide Peroxide]     Swelling in ear canal.    Family History  Problem Relation Age of Onset  . Breast cancer Mother   . Breast cancer Maternal Aunt   . Breast cancer Maternal Aunt   . Lung disease Neg Hx   . Colon cancer Neg Hx   . Stomach cancer Neg Hx   . Pancreatic cancer Neg Hx   . Rectal cancer Neg Hx    Social History   Socioeconomic History  . Marital status: Married    Spouse name: Not on file  . Number of children: Not on file  . Years of education: Not on file  . Highest education level: Not on file  Occupational History  . Not on file  Tobacco Use  . Smoking status: Former Smoker    Packs/day: 1.00    Years: 50.00    Pack years: 50.00    Types: Cigarettes    Quit date: 08/15/2012    Years since quitting: 7.3  . Smokeless tobacco: Never Used  Vaping Use  . Vaping Use: Never used  Substance and Sexual Activity  . Alcohol use: No  .  Drug use: No  . Sexual activity: Not on file  Other Topics Concern  . Not on file  Social History Narrative   [facility] (09/26/16):   Originally from [place]. Moved to [facility] February 2018. Always lived in [place]. Moved to be closer to children & grandchildren. No international travel. 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 No mold exposure. No bird exposure or hot tub exposure. Enjoys reading. Previously enjoyed wood working with domestic woods.    Social Determinants of Health   Financial Resource Strain: Low Risk   . Difficulty of Paying Living Expenses: Not hard at all  Food Insecurity: No Food Insecurity  . Worried About Charity fundraiser in the Last Year: Never true  . Ran Out of Food in the Last Year: Never true  Transportation Needs: No Transportation Needs  . Lack of  Transportation (Medical): No  . Lack of Transportation (Non-Medical): No  Physical Activity:   . Days of Exercise per Week: Not on file  . Minutes of Exercise per Session: Not on file  Stress:   . Feeling of Stress : Not on file  Social Connections:   . Frequency of Communication with Friends and Family: Not on file  . Frequency of Social Gatherings with Friends and Family: Not on file  . Attends Religious Services: Not on file  . Active Member of Clubs or Organizations: Not on file  . Attends Archivist Meetings: Not on file  . Marital Status: Not on file  Intimate Partner Violence:   . Fear of Current or Ex-Partner: Not on file  . Emotionally Abused: Not on file  . Physically Abused: Not on file  . Sexually Abused: Not on file    Physical Exam: Vital signs in last 24 hours: Temp:  [97.5 F (36.4 C)] 97.5 F (36.4 C) (09/20 0627) Pulse Rate:  [94] 94 (09/20 0627) Resp:  [20] 20 (09/20 0627) BP: (127)/(81) 127/81 (09/20 0627) SpO2:  [98 %] 98 % (09/20 0627) Weight:  [100.7 kg] 100.7 kg (09/20 0627)   GEN: NAD EYE: Sclerae anicteric ENT: MMM CV: Non-tachycardic GI: Soft, NT/ND NEURO:  Alert & Oriented x 3  Lab Results: No results for input(s): WBC, HGB, HCT, PLT in the last 72 hours. BMET No results for input(s): NA, K, CL, CO2, GLUCOSE, BUN, CREATININE, CALCIUM in the last 72 hours. LFT No results for input(s): PROT, ALBUMIN, AST, ALT, ALKPHOS, BILITOT, BILIDIR, IBILI in the last 72 hours. PT/INR No results for input(s): LABPROT, INR in the last 72 hours.   Impression / Plan: This is a 69 y.o.male who presents for EUS/EGD for evaluation of idiopathic pancreatitis and some persistent early satiety - weight loss has stabilized.  The risks of an EUS including intestinal perforation, bleeding, infection, aspiration, and medication effects were discussed as was the possibility it may not give a definitive diagnosis if a biopsy is performed.  When a biopsy of  the pancreas is done as part of the EUS, there is an additional risk of pancreatitis at the rate of about 1-2%.  It was explained that procedure related pancreatitis is typically mild, although it can be severe and even life threatening, which is why we do not perform random pancreatic biopsies and only biopsy a lesion/area we feel is concerning enough to warrant the risk.  The risks and benefits of endoscopic evaluation were discussed with the patient; these include but are not limited  to the risk of perforation, infection, bleeding, missed lesions, lack of diagnosis, severe illness requiring hospitalization, as well as anesthesia and sedation related illnesses.  The patient is agreeable to proceed.    Justice Britain, MD [facility]oscopy Office # [phone]

## 2019-12-21 NOTE — Transfer of Care (Signed)
Immediate Anesthesia Transfer of Care Note  Patient: Victor Huber  Procedure(s) Performed: UPPER ENDOSCOPIC ULTRASOUND (EUS) RADIAL (N/A ) ESOPHAGOGASTRODUODENOSCOPY (EGD) WITH PROPOFOL (N/A ) BIOPSY  Patient Location: PACU  Anesthesia Type:MAC  Level of Consciousness: awake, alert  and oriented  Airway & Oxygen Therapy: Patient Spontanous Breathing and Patient connected to nasal cannula oxygen  Post-op Assessment: Report given to RN, Post -op Vital signs reviewed and stable and Patient moving all extremities  Post vital signs: Reviewed and stable  Last Vitals:  Vitals Value Taken Time  BP 89/60 12/21/19 0832  Temp    Pulse 67 12/21/19 0834  Resp 23 12/21/19 0833  SpO2 98 % 12/21/19 0834  Vitals shown include unvalidated device data.  Last Pain:  Vitals:   12/21/19 0627  TempSrc: Oral  PainSc: 0-No pain         Complications: No complications documented.

## 2019-12-22 ENCOUNTER — Other Ambulatory Visit: Payer: Self-pay

## 2019-12-22 ENCOUNTER — Encounter: Payer: Self-pay | Admitting: Gastroenterology

## 2019-12-22 MED ORDER — CREON 36000-114000 UNITS PO CPEP
72000.0000 [IU] | ORAL_CAPSULE | Freq: Three times a day (TID) | ORAL | 3 refills | Status: DC
Start: 2019-12-22 — End: 2019-12-30

## 2019-12-23 ENCOUNTER — Encounter (HOSPITAL_COMMUNITY): Payer: Self-pay | Admitting: Gastroenterology

## 2019-12-23 LAB — SURGICAL PATHOLOGY

## 2019-12-30 ENCOUNTER — Other Ambulatory Visit: Payer: Self-pay

## 2019-12-30 ENCOUNTER — Telehealth: Payer: Self-pay | Admitting: Gastroenterology

## 2019-12-30 MED ORDER — CREON 36000-114000 UNITS PO CPEP
72000.0000 [IU] | ORAL_CAPSULE | Freq: Three times a day (TID) | ORAL | 3 refills | Status: DC
Start: 2019-12-30 — End: 2020-02-05

## 2019-12-30 NOTE — Telephone Encounter (Signed)
Called [employer] pt assistance to check the status of the patients Creon, left a detailed message for someone to call back.

## 2019-12-30 NOTE — Telephone Encounter (Signed)
Pt's wife called to inform that prescription for Creon needs to be sent to [facility] instead of [facility].

## 2019-12-30 NOTE — Telephone Encounter (Signed)
Called [employer]  Spoke with a representative named Victor Huber- the patients wife called today to request a refill. The  medication will be sent straight to the patient and his  refills are not automatic, they require the patient or spouse to call in monthly for his refills.  Per [id number]- The patients enrollment runs out in December 2021 so they will need to re-enroll with [facility] patient assistance and a new rx will then need to be submitted.

## 2019-12-30 NOTE — Telephone Encounter (Signed)
No I did not because Jakiyyah from [employer] stated that the patients wife called today and all of this was relayed to them. I will call them just to remind them.  No answer on patients home number, Left a detailed message regarding refilling his Creon monthly and that they will need to reapply for [employer] assist in December 2021.

## 2019-12-30 NOTE — Telephone Encounter (Signed)
Victor Huber have you seen the Creon for this pt? See your phone note from 8/17

## 2019-12-30 NOTE — Telephone Encounter (Signed)
[name] from [employer] is wanting to advise Victor Huber she needs to call the phone number [phone] instead.

## 2019-12-30 NOTE — Telephone Encounter (Signed)
Did you call the pt and let him know the information you found out?

## 2019-12-31 ENCOUNTER — Other Ambulatory Visit: Payer: Self-pay

## 2019-12-31 ENCOUNTER — Telehealth: Payer: Self-pay | Admitting: *Deleted

## 2019-12-31 ENCOUNTER — Inpatient Hospital Stay: Payer: Medicare HMO

## 2019-12-31 ENCOUNTER — Other Ambulatory Visit: Payer: Self-pay | Admitting: *Deleted

## 2019-12-31 ENCOUNTER — Inpatient Hospital Stay (HOSPITAL_BASED_OUTPATIENT_CLINIC_OR_DEPARTMENT_OTHER): Payer: Medicare HMO | Admitting: Internal Medicine

## 2019-12-31 ENCOUNTER — Encounter: Payer: Self-pay | Admitting: Internal Medicine

## 2019-12-31 VITALS — BP 102/67 | HR 90 | Temp 98.6°F | Resp 17 | Ht 73.0 in | Wt 225.1 lb

## 2019-12-31 DIAGNOSIS — C7951 Secondary malignant neoplasm of bone: Secondary | ICD-10-CM

## 2019-12-31 DIAGNOSIS — C3432 Malignant neoplasm of lower lobe, left bronchus or lung: Secondary | ICD-10-CM

## 2019-12-31 DIAGNOSIS — R5383 Other fatigue: Secondary | ICD-10-CM

## 2019-12-31 DIAGNOSIS — C3492 Malignant neoplasm of unspecified part of left bronchus or lung: Secondary | ICD-10-CM

## 2019-12-31 DIAGNOSIS — C770 Secondary and unspecified malignant neoplasm of lymph nodes of head, face and neck: Secondary | ICD-10-CM | POA: Diagnosis not present

## 2019-12-31 DIAGNOSIS — Z95828 Presence of other vascular implants and grafts: Secondary | ICD-10-CM

## 2019-12-31 DIAGNOSIS — Z5112 Encounter for antineoplastic immunotherapy: Secondary | ICD-10-CM

## 2019-12-31 DIAGNOSIS — C3490 Malignant neoplasm of unspecified part of unspecified bronchus or lung: Secondary | ICD-10-CM

## 2019-12-31 LAB — CBC WITH DIFFERENTIAL (CANCER CENTER ONLY)
Abs Immature Granulocytes: 0.02 10*3/uL (ref 0.00–0.07)
Basophils Absolute: 0 10*3/uL (ref 0.0–0.1)
Basophils Relative: 0 %
Eosinophils Absolute: 0.1 10*3/uL (ref 0.0–0.5)
Eosinophils Relative: 1 %
HCT: 39.3 % (ref 39.0–52.0)
Hemoglobin: 13.8 g/dL (ref 13.0–17.0)
Immature Granulocytes: 0 %
Lymphocytes Relative: 17 %
Lymphs Abs: 1.4 10*3/uL (ref 0.7–4.0)
MCH: 33.5 pg (ref 26.0–34.0)
MCHC: 35.1 g/dL (ref 30.0–36.0)
MCV: 95.4 fL (ref 80.0–100.0)
Monocytes Absolute: 0.6 10*3/uL (ref 0.1–1.0)
Monocytes Relative: 7 %
Neutro Abs: 6 10*3/uL (ref 1.7–7.7)
Neutrophils Relative %: 75 %
Platelet Count: 169 10*3/uL (ref 150–400)
RBC: 4.12 MIL/uL — ABNORMAL LOW (ref 4.22–5.81)
RDW: 13.4 % (ref 11.5–15.5)
WBC Count: 8 10*3/uL (ref 4.0–10.5)
nRBC: 0 % (ref 0.0–0.2)

## 2019-12-31 LAB — CMP (CANCER CENTER ONLY)
ALT: 16 U/L (ref 0–44)
AST: 13 U/L — ABNORMAL LOW (ref 15–41)
Albumin: 3.6 g/dL (ref 3.5–5.0)
Alkaline Phosphatase: 117 U/L (ref 38–126)
Anion gap: 5 (ref 5–15)
BUN: 14 mg/dL (ref 8–23)
CO2: 25 mmol/L (ref 22–32)
Calcium: 9.1 mg/dL (ref 8.9–10.3)
Chloride: 107 mmol/L (ref 98–111)
Creatinine: 0.94 mg/dL (ref 0.61–1.24)
GFR, Est AFR Am: >60 mL/min (ref >60)
GFR, Estimated: >60 mL/min (ref >60)
Glucose, Bld: 116 mg/dL — ABNORMAL HIGH (ref 70–99)
Potassium: 4.3 mmol/L (ref 3.5–5.1)
Sodium: 137 mmol/L (ref 135–145)
Total Bilirubin: 0.7 mg/dL (ref 0.3–1.2)
Total Protein: 6.5 g/dL (ref 6.5–8.1)

## 2019-12-31 LAB — TSH: TSH: 11.657 u[IU]/mL — ABNORMAL HIGH (ref 0.320–4.118)

## 2019-12-31 MED ORDER — SODIUM CHLORIDE 0.9% FLUSH
10.0000 mL | INTRAVENOUS | Status: DC | PRN
  Administered 2019-12-31: 10 mL
  Filled 2019-12-31: qty 10

## 2019-12-31 MED ORDER — HEPARIN SOD (PORK) LOCK FLUSH 100 UNIT/ML IV SOLN
500.0000 [IU] | Freq: Once | INTRAVENOUS | Status: AC | PRN
  Administered 2019-12-31: 500 [IU]
  Filled 2019-12-31: qty 5

## 2019-12-31 MED ORDER — SODIUM CHLORIDE 0.9 % IV SOLN
200.0000 mg | Freq: Once | INTRAVENOUS | Status: AC
  Administered 2019-12-31: 200 mg via INTRAVENOUS
  Filled 2019-12-31: qty 8

## 2019-12-31 MED ORDER — SODIUM CHLORIDE 0.9 % IV SOLN
Freq: Once | INTRAVENOUS | Status: AC
  Filled 2019-12-31: qty 250

## 2019-12-31 NOTE — Patient Instructions (Signed)

## 2019-12-31 NOTE — Telephone Encounter (Signed)
-----   Message from [employer], PA-C sent at 12/31/2019  1:12 PM EDT ----- He is probably in the infusion room. His PCP does his synthroid. Let him know or message his infusion nurse his number went up (which means his thyroid is less active). Probably needs his dose increased again.  ----- Message ----- From: Interface, Lab In [place] Sent: 12/31/2019  11:38 AM EDT To: Tobe Sos Heilingoetter, PA-C

## 2019-12-31 NOTE — Patient Instructions (Signed)
[facility] Discharge Instructions for Patients Receiving Chemotherapy  Today you received the following chemotherapy agents :  Keytruda.  To help prevent nausea and vomiting after your treatment, we encourage you to take your nausea medication as prescribed.   If you develop nausea and vomiting that is not controlled by your nausea medication, call the clinic.   BELOW ARE SYMPTOMS THAT SHOULD BE REPORTED IMMEDIATELY:  *FEVER GREATER THAN 100.5 F  *CHILLS WITH OR WITHOUT FEVER  NAUSEA AND VOMITING THAT IS NOT CONTROLLED WITH YOUR NAUSEA MEDICATION  *UNUSUAL SHORTNESS OF BREATH  *UNUSUAL BRUISING OR BLEEDING  TENDERNESS IN MOUTH AND THROAT WITH OR WITHOUT PRESENCE OF ULCERS  *URINARY PROBLEMS  *BOWEL PROBLEMS  UNUSUAL RASH Items with * indicate a potential emergency and should be followed up as soon as possible.  Feel free to call the clinic should you have any questions or concerns. The clinic phone number is [phone].  Please show the CHEMO ALERT CARD at check-in to the Emergency Department and triage nurse.  

## 2019-12-31 NOTE — Progress Notes (Signed)
[place] Telephone:(336) [phone]   Fax:(336) [phone]  OFFICE PROGRESS NOTE  Buzzy Han, MD [place] [address]  DIAGNOSIS: Metastatic non-small cell lung cancer initially diagnosed as stage IIIA (T2a, N2, M0) non-small cell lung cancer, poorly differentiated adenocarcinoma presented with left lower lobe lung mass in addition to mediastinal lymphadenopathy.  The patient was diagnosed with metastatic disease involving the left femur as well as left supraclavicular nodal metastases and right paratracheal lymphadenopathy in October 2018.  Biomarker Findings Microsatellite Status - MS-Stable Tumor Mutational Burden - TMB-Low (3 Muts/Mb) Genomic Findings For a complete list of the genes assayed, please refer to the Appendix. STK11 P275fs*6 KYHC6C [id number] splice site 151+7O>H DAXX E374* MLL2 V4583fs*40 NBN K25fs*5 NOTCH2 Y0737T PMS2 splice site 062+6R>S 8 Disease relevant genes with no reportable alterations: EGFR, KRAS, ALK, BRAF, MET, RET, ERBB2, ROS1   PDL1 expression 5%  PRIOR THERAPY: 1) status post wedge resection of the left lower lobe lung mass as well as AP window lymph node dissection but there was residual metastatic mediastinal lymphadenopathy that could not be resected. 2) a course of concurrent chemoradiation with weekly carboplatin and paclitaxel in [place] completed 03/01/2015.  3) status post palliative radiotherapy to the left femur metastatic bone disease. 4)  Systemic chemotherapy with carboplatin for AUC of 5, Alimta 500 mg/M2 and Keytruda 200 mg IV every 3 weeks.  First dose March 07, 2017.  Carboplatin was discontinued during cycle #2 secondary to hypersensitivity reaction. 5) status post 2 cycles of maintenance treatment with Alimta and Ketruda (pembrolizumab).  Alimta was discontinued secondary to intolerance.  CURRENT THERAPY: Maintenance treatment with single agent Ketruda (pembrolizumab) status post  45  cycles.  INTERVAL HISTORY: Victor Huber 69 y.o. male returns to the clinic today for follow-up visit.  The patient continues to complain of increasing fatigue and weakness.  He does not exercise at regular basis as he done in the past.  The patient was seen by gastroenterologist and he underwent upper endoscopy as well as endoscopic ultrasound and evaluation of the pancreas and gallbladder.  There was some minor abnormality and cystic lesion but no significant concerning findings.  He was started on Creon for management of the pancreatic insufficiency.  The patient denied having any current fever or chills.  He has no nausea, vomiting, diarrhea or constipation.  He denied having any headache or visual changes.  He denied having any significant weight loss or night sweats.  He is here today for evaluation before starting cycle #46 of his treatment.  MEDICAL HISTORY: Past Medical History:  Diagnosis Date  . Adenocarcinoma of left lung, stage 3 ([place]) 08/15/2016  . Anxiety   . Arthritis   . Atrial fibrillation ([place]) 08/15/2016  . Atrial fibrillation ([place])   . Cancer, metastatic to bone ([place])   . COPD (chronic obstructive pulmonary disease) ([facility]) 08/15/2016  . Depression   . Dysphagia   . Dysrhythmia    a-fib  . History of chemotherapy   . History of radiation therapy   . Hyperlipidemia   . Hypothyroid 08/15/2016  . Longstanding persistent atrial fibrillation ([facility]) 08/29/2016  . Pathologic fracture    left femur  . Pneumonitis   . S/P TURP 08/15/2016  . Wears glasses   . Wears hearing aid in both ears     ALLERGIES:  is allergic to carboplatin, flecainide, and debrox [carbamide peroxide].  MEDICATIONS:  Current Outpatient Medications  Medication Sig Dispense Refill  . albuterol (VENTOLIN  HFA) 108 (90 Base) MCG/ACT inhaler Inhale 1-2 puffs into the lungs every 4 (four) hours as needed for wheezing or shortness of breath. 3 month supply 24 g 3  . atenolol (TENORMIN) 50 MG tablet  TAKE 1 TABLET (50 MG TOTAL) BY MOUTH DAILY. 90 tablet 3  . atorvastatin (LIPITOR) 40 MG tablet Take 1 tablet (40 mg total) by mouth daily. 90 tablet 2  . buPROPion (WELLBUTRIN) 75 MG tablet Take 150 mg by mouth 2 (two) times daily.     Marland Kitchen CREON 36000-114000 units CPEP capsule Take 2 capsules (72,000 Units total) by mouth 3 (three) times daily before meals. And 2 capsules daily with snacks 540 capsule 3  . ELIQUIS 5 MG TABS tablet TAKE 1 TABLET TWICE DAILY (Patient taking differently: Take 5 mg by mouth 2 (two) times daily. ) 180 tablet 1  . ibuprofen (ADVIL,MOTRIN) 400 MG tablet Take 400 mg by mouth every 4 (four) hours as needed for moderate pain.    Marland Kitchen levothyroxine (SYNTHROID) 150 MCG tablet Take 150 mcg by mouth daily before breakfast. Take with 25 mcg to equal 175 mcg daily    . Oxycodone HCl 10 MG TABS Take one daily as needed for severe back pain. (Patient taking differently: Take 10 mg by mouth daily as needed (pain). ) 30 tablet 0  . pantoprazole (PROTONIX) 40 MG tablet Take 1 tablet (40 mg total) by mouth 2 (two) times daily before a meal. 60 tablet 4  . tamsulosin (FLOMAX) 0.4 MG CAPS capsule Take 1 capsule (0.4 mg total) by mouth daily. 90 capsule 4   No current facility-administered medications for this visit.    SURGICAL HISTORY:  Past Surgical History:  Procedure Laterality Date  . BIOPSY  12/21/2019   Procedure: BIOPSY;  Surgeon: Rush Landmark Telford Nab., MD;  Location: [facility];  Service: Gastroenterology;;  . BRONCHOSCOPY  10/2014  . CARDIAC CATHETERIZATION     05/07/12  . CARDIOVERSION     x2  . COLONOSCOPY    . DG BIOPSY LUNG Left 10/2014   FNA - Adenocarcinoma   . ESOPHAGOGASTRODUODENOSCOPY (EGD) WITH PROPOFOL N/A 12/21/2019   Procedure: ESOPHAGOGASTRODUODENOSCOPY (EGD) WITH PROPOFOL;  Surgeon: Rush Landmark Telford Nab., MD;  Location: [facility];  Service: Gastroenterology;  Laterality: N/A;  . EUS N/A 12/21/2019   Procedure: UPPER ENDOSCOPIC ULTRASOUND (EUS) RADIAL;   Surgeon: Rush Landmark Telford Nab., MD;  Location: [facility];  Service: Gastroenterology;  Laterality: N/A;  . FEMUR IM NAIL Left 02/19/2017  . FEMUR IM NAIL Left 02/19/2017   Procedure: INTRAMEDULLARY (IM) NAIL FEMORAL;  Surgeon: Marchia Bond, MD;  Location: [facility];  Service: Orthopedics;  Laterality: Left;  . IR FLUORO GUIDE PORT INSERTION RIGHT  07/03/2017  . IR US GUIDE VASC ACCESS RIGHT  07/03/2017  . LUNG CANCER SURGERY Left 12/2014   Wedge Resection   . MULTIPLE TOOTH EXTRACTIONS    . Status post TURP    . TONSILLECTOMY      REVIEW OF SYSTEMS:  A comprehensive review of systems was negative except for: Constitutional: positive for fatigue Musculoskeletal: positive for muscle weakness   PHYSICAL EXAMINATION: General appearance: alert, cooperative, fatigued and no distress Head: Normocephalic, without obvious abnormality, atraumatic Neck: no adenopathy, no JVD, supple, symmetrical, trachea midline and thyroid not enlarged, symmetric, no tenderness/mass/nodules Lymph nodes: Cervical, supraclavicular, and axillary nodes normal. Resp: clear to auscultation bilaterally Back: symmetric, no curvature. ROM normal. No CVA tenderness. Cardio: regular rate and rhythm, S1, S2 normal, no murmur, click, rub or gallop GI:  soft, non-tender; bowel sounds normal; no masses,  no organomegaly Extremities: extremities normal, atraumatic, no cyanosis or edema  ECOG PERFORMANCE STATUS: 1 - Symptomatic but completely ambulatory  Blood pressure 102/67, pulse 90, temperature 98.6 F (37 C), temperature source Tympanic, resp. rate 17, height 6\' 1"  (1.854 m), weight 225 lb 1.6 oz (102.1 kg), SpO2 97 %.  LABORATORY DATA: Lab Results  Component Value Date   WBC 8.0 12/31/2019   HGB 13.8 12/31/2019   HCT 39.3 12/31/2019   MCV 95.4 12/31/2019   PLT 169 12/31/2019      Chemistry      Component Value Date/Time   NA 137 12/31/2019 1130   NA 136 04/04/2017 1141   K 4.3 12/31/2019 1130   K 4.4  04/04/2017 1141   CL 107 12/31/2019 1130   CO2 25 12/31/2019 1130   CO2 24 04/04/2017 1141   BUN 14 12/31/2019 1130   BUN 21.1 04/04/2017 1141   CREATININE 0.94 12/31/2019 1130   CREATININE 1.1 04/04/2017 1141      Component Value Date/Time   CALCIUM 9.1 12/31/2019 1130   CALCIUM 9.1 04/04/2017 1141   ALKPHOS 117 12/31/2019 1130   ALKPHOS 89 04/04/2017 1141   AST 13 (L) 12/31/2019 1130   AST 17 04/04/2017 1141   ALT 16 12/31/2019 1130   ALT 18 04/04/2017 1141   BILITOT 0.7 12/31/2019 1130   BILITOT 0.67 04/04/2017 1141       RADIOGRAPHIC STUDIES: No results found.  ASSESSMENT AND PLAN: This is a 69 years old white male with metastatic non-small cell lung cancer, adenocarcinoma with no actionable mutations and PDL 1 expression of 5% that was initially diagnosed as stage IIIa non-small cell lung cancer, adenocarcinoma status post left lower lobectomy with lymph node dissection followed by a course of concurrent chemoradiation completed in January 2016.  The patient had evidence for disease metastasis in October 2018 with metastatic disease to the left femur as well as left supraclavicular and right paratracheal lymph nodes. The patient is currently on systemic chemotherapy initially was with carboplatin, Alimta and Keytruda.  Carboplatin was discontinued secondary to hypersensitivity reaction starting from cycle #2.   He was also treated with 3 cycles of maintenance Alimta and Ketruda (pembrolizumab) but Alimta was discontinued secondary to intolerance. He is currently undergoing treatment with maintenance Keytruda as a single agent status post 45 cycles.  He has been tolerating this treatment well with no concerning adverse effects. I had a lengthy discussion with the patient today about his condition especially with worsening fatigue.  I gave him the option of holding his treatment for few cycles to see if there is improvement of his fatigue but he declined this option. He will  proceed with cycle #46 today as planned. I will see him back for follow-up visit in 3 weeks for evaluation with repeat CT scan of the chest, abdomen pelvis for restaging of his disease. For the fatigue I encouraged him to increase his exercise slowly. For the suspicious pancreatitis, he is followed by gastroenterology and currently on treatment with Creon. The patient was advised to call immediately if he has any other concerning symptoms in the interval. The patient voices understanding of current disease status and treatment options and is in agreement with the current care plan. All questions were answered. The patient knows to call the clinic with any problems, questions or concerns. We can certainly see the patient much sooner if necessary.  Disclaimer: This note was dictated with voice recognition  software. Similar sounding words can inadvertently be transcribed and may not be corrected upon review.

## 2019-12-31 NOTE — Telephone Encounter (Signed)
On behalf of [facility] request.   Called patient to explain his recent increase in TSH and he stated that his PCP was not agreeable to having his TSH checked every 3 weeks like we do but instead recommended that It be checked every 3 months.  Explained the difference between natural thyroid dysfunction vs manipulated dysfunction which is what is happening because of the medication that we are giving him and how that needs to be monitored closer.    He stated that he doesn't know if the PCP will continue to adjust due to the contraindication in testing and then adjusting that often.    Patient asked that we order his levothyroxine and manage it for him.  Routed to [facility] to see if she would be agreeable to managing that since we are also the ones who are testing it.

## 2020-01-03 ENCOUNTER — Encounter: Payer: Self-pay | Admitting: Internal Medicine

## 2020-01-04 ENCOUNTER — Telehealth: Payer: Self-pay | Admitting: Medical Oncology

## 2020-01-04 NOTE — Telephone Encounter (Signed)
TSH result and monitoring - last TSH higher. I told pt I will call him back with any dose changes and if  his PCP , Dr "Jenetta Downer" or Julien Nordmann  will be monitoring TSH .

## 2020-01-07 ENCOUNTER — Telehealth: Payer: Self-pay | Admitting: Medical Oncology

## 2020-01-07 NOTE — Telephone Encounter (Signed)
Faxed recent TSH and note that Dr Julien Nordmann is no longer monitoring pt TSH.

## 2020-01-14 NOTE — Telephone Encounter (Addendum)
Yes please set up a follow-up with NP Kennedy-Smith or myself in the next few weeks.  Okay to maintain current dosing of pancreas enzyme replacement therapy but please make sure that the [employer] group knows that it is 4 pills with each meal and 1-2 with each snack. Please set up endoscopy for esophagitis follow-up and potential dilation in the [place] for late November/December. Thanks. GM

## 2020-01-18 ENCOUNTER — Telehealth: Payer: Self-pay | Admitting: Medical Oncology

## 2020-01-18 DIAGNOSIS — C3492 Malignant neoplasm of unspecified part of left bronchus or lung: Secondary | ICD-10-CM

## 2020-01-18 DIAGNOSIS — C3490 Malignant neoplasm of unspecified part of unspecified bronchus or lung: Secondary | ICD-10-CM

## 2020-01-18 NOTE — Telephone Encounter (Signed)
Not feeling well, seeing PCP today for ? Conjunctivitis, cold-like symptoms . Has not heard form CT scan appt.

## 2020-01-19 ENCOUNTER — Telehealth: Payer: Self-pay

## 2020-01-19 NOTE — Telephone Encounter (Signed)
Pt has CT Scan scheduled for 10/29 and OV with Dr. Julien Nordmann 10/21.  Dr. Julien Nordmann, do you want his OV with you r/s or do you still want to see him on that date since he also has tx planned?

## 2020-01-20 ENCOUNTER — Telehealth: Payer: Self-pay | Admitting: Medical Oncology

## 2020-01-20 NOTE — Telephone Encounter (Signed)
Please reschedule the visit after the scan.

## 2020-01-20 NOTE — Telephone Encounter (Signed)
MIichael is not feeling well for over several weeks. He will keep appt tomorrow. Requested labs from PCP.

## 2020-01-21 ENCOUNTER — Inpatient Hospital Stay: Payer: Medicare HMO | Attending: Internal Medicine | Admitting: Internal Medicine

## 2020-01-21 ENCOUNTER — Encounter: Payer: Self-pay | Admitting: Internal Medicine

## 2020-01-21 ENCOUNTER — Inpatient Hospital Stay: Payer: Medicare HMO

## 2020-01-21 ENCOUNTER — Other Ambulatory Visit: Payer: Self-pay

## 2020-01-21 VITALS — BP 113/69 | HR 72 | Temp 99.0°F | Resp 18 | Ht 73.0 in | Wt 221.3 lb

## 2020-01-21 DIAGNOSIS — Z5112 Encounter for antineoplastic immunotherapy: Secondary | ICD-10-CM

## 2020-01-21 DIAGNOSIS — Z9221 Personal history of antineoplastic chemotherapy: Secondary | ICD-10-CM | POA: Insufficient documentation

## 2020-01-21 DIAGNOSIS — C3432 Malignant neoplasm of lower lobe, left bronchus or lung: Secondary | ICD-10-CM | POA: Diagnosis present

## 2020-01-21 DIAGNOSIS — C3492 Malignant neoplasm of unspecified part of left bronchus or lung: Secondary | ICD-10-CM

## 2020-01-21 DIAGNOSIS — C3490 Malignant neoplasm of unspecified part of unspecified bronchus or lung: Secondary | ICD-10-CM

## 2020-01-21 DIAGNOSIS — Z791 Long term (current) use of non-steroidal anti-inflammatories (NSAID): Secondary | ICD-10-CM | POA: Diagnosis not present

## 2020-01-21 DIAGNOSIS — F419 Anxiety disorder, unspecified: Secondary | ICD-10-CM | POA: Insufficient documentation

## 2020-01-21 DIAGNOSIS — Z7901 Long term (current) use of anticoagulants: Secondary | ICD-10-CM | POA: Diagnosis not present

## 2020-01-21 DIAGNOSIS — E039 Hypothyroidism, unspecified: Secondary | ICD-10-CM | POA: Diagnosis not present

## 2020-01-21 DIAGNOSIS — F329 Major depressive disorder, single episode, unspecified: Secondary | ICD-10-CM | POA: Insufficient documentation

## 2020-01-21 DIAGNOSIS — J449 Chronic obstructive pulmonary disease, unspecified: Secondary | ICD-10-CM | POA: Diagnosis not present

## 2020-01-21 DIAGNOSIS — E785 Hyperlipidemia, unspecified: Secondary | ICD-10-CM | POA: Diagnosis not present

## 2020-01-21 DIAGNOSIS — C7951 Secondary malignant neoplasm of bone: Secondary | ICD-10-CM | POA: Diagnosis not present

## 2020-01-21 DIAGNOSIS — Z902 Acquired absence of lung [part of]: Secondary | ICD-10-CM | POA: Insufficient documentation

## 2020-01-21 DIAGNOSIS — Z9079 Acquired absence of other genital organ(s): Secondary | ICD-10-CM | POA: Diagnosis not present

## 2020-01-21 DIAGNOSIS — Z79899 Other long term (current) drug therapy: Secondary | ICD-10-CM | POA: Diagnosis not present

## 2020-01-21 DIAGNOSIS — I4891 Unspecified atrial fibrillation: Secondary | ICD-10-CM | POA: Insufficient documentation

## 2020-01-21 DIAGNOSIS — Z923 Personal history of irradiation: Secondary | ICD-10-CM | POA: Insufficient documentation

## 2020-01-21 MED ORDER — METHYLPREDNISOLONE 4 MG PO TBPK: ORAL_TABLET | ORAL | 0 refills | Status: DC

## 2020-01-21 NOTE — Progress Notes (Signed)
[name] Telephone:(336) [phone]   Fax:(336) [phone]  OFFICE PROGRESS NOTE  Buzzy Han, MD [place] [address]  DIAGNOSIS: Metastatic non-small cell lung cancer initially diagnosed as stage IIIA (T2a, N2, M0) non-small cell lung cancer, poorly differentiated adenocarcinoma presented with left lower lobe lung mass in addition to mediastinal lymphadenopathy.  The patient was diagnosed with metastatic disease involving the left femur as well as left supraclavicular nodal metastases and right paratracheal lymphadenopathy in October 2018.  Biomarker Findings Microsatellite Status - MS-Stable Tumor Mutational Burden - TMB-Low (3 Muts/Mb) Genomic Findings For a complete list of the genes assayed, please refer to the Appendix. STK11 P237fs*6 CHYI5O Y77AJO splice site 878+6V>E DAXX E374* MLL2 V4566fs*40 NBN K219fs*5 NOTCH2 H2094B PMS2 splice site 096+2E>Z 8 Disease relevant genes with no reportable alterations: EGFR, KRAS, ALK, BRAF, MET, RET, ERBB2, ROS1   PDL1 expression 5%  PRIOR THERAPY: 1) status post wedge resection of the left lower lobe lung mass as well as AP window lymph node dissection but there was residual metastatic mediastinal lymphadenopathy that could not be resected. 2) a course of concurrent chemoradiation with weekly carboplatin and paclitaxel in [place] completed 03/01/2015.  3) status post palliative radiotherapy to the left femur metastatic bone disease. 4)  Systemic chemotherapy with carboplatin for AUC of 5, Alimta 500 mg/M2 and Keytruda 200 mg IV every 3 weeks.  First dose March 07, 2017.  Carboplatin was discontinued during cycle #2 secondary to hypersensitivity reaction. 5) status post 2 cycles of maintenance treatment with Alimta and Ketruda (pembrolizumab).  Alimta was discontinued secondary to intolerance.  CURRENT THERAPY: Maintenance treatment with single agent Ketruda (pembrolizumab) status post  46  cycles.  INTERVAL HISTORY: Victor Huber 69 y.o. male returns to the clinic today for follow-up visit accompanied by his wife.  The patient has been complaining of increasing fatigue and weakness as well as chest congestion and conjunctivitis recently.  He contacted his primary care physician and he was advised to use over-the-counter eyedrops.  He denied having any current chest pain, shortness of breath, cough or hemoptysis.  He denied having any fever or chills.  He has no nausea, vomiting, diarrhea or constipation.  He has no headache or visual changes.  He was supposed to start cycle #47 of his treatment today but the imaging studies were delayed until late next week.  MEDICAL HISTORY: Past Medical History:  Diagnosis Date  . Adenocarcinoma of left lung, stage 3 ([facility]) 08/15/2016  . Anxiety   . Arthritis   . Atrial fibrillation ([place]) 08/15/2016  . Atrial fibrillation ([place])   . Cancer, metastatic to bone ([place])   . COPD (chronic obstructive pulmonary disease) ([facility]) 08/15/2016  . Depression   . Dysphagia   . Dysrhythmia    a-fib  . History of chemotherapy   . History of radiation therapy   . Hyperlipidemia   . Hypothyroid 08/15/2016  . Longstanding persistent atrial fibrillation ([facility]) 08/29/2016  . Pathologic fracture    left femur  . Pneumonitis   . S/P TURP 08/15/2016  . Wears glasses   . Wears hearing aid in both ears     ALLERGIES:  is allergic to carboplatin, flecainide, and debrox [carbamide peroxide].  MEDICATIONS:  Current Outpatient Medications  Medication Sig Dispense Refill  . albuterol (VENTOLIN HFA) 108 (90 Base) MCG/ACT inhaler Inhale 1-2 puffs into the lungs every 4 (four) hours as needed for wheezing or shortness of breath. 3 month supply 24 g  3  . atenolol (TENORMIN) 50 MG tablet TAKE 1 TABLET (50 MG TOTAL) BY MOUTH DAILY. 90 tablet 3  . atorvastatin (LIPITOR) 40 MG tablet Take 1 tablet (40 mg total) by mouth daily. 90 tablet 2  . buPROPion (WELLBUTRIN) 75  MG tablet Take 150 mg by mouth 2 (two) times daily.     Marland Kitchen CREON 36000-114000 units CPEP capsule Take 2 capsules (72,000 Units total) by mouth 3 (three) times daily before meals. And 2 capsules daily with snacks 540 capsule 3  . ELIQUIS 5 MG TABS tablet TAKE 1 TABLET TWICE DAILY (Patient taking differently: Take 5 mg by mouth 2 (two) times daily. ) 180 tablet 1  . ibuprofen (ADVIL,MOTRIN) 400 MG tablet Take 400 mg by mouth every 4 (four) hours as needed for moderate pain.    Marland Kitchen levothyroxine (SYNTHROID) 150 MCG tablet Take 150 mcg by mouth daily before breakfast. Take with 25 mcg to equal 175 mcg daily    . Oxycodone HCl 10 MG TABS Take one daily as needed for severe back pain. (Patient taking differently: Take 10 mg by mouth daily as needed (pain). ) 30 tablet 0  . pantoprazole (PROTONIX) 40 MG tablet Take 1 tablet (40 mg total) by mouth 2 (two) times daily before a meal. 60 tablet 4  . tamsulosin (FLOMAX) 0.4 MG CAPS capsule Take 1 capsule (0.4 mg total) by mouth daily. 90 capsule 4   No current facility-administered medications for this visit.    SURGICAL HISTORY:  Past Surgical History:  Procedure Laterality Date  . BIOPSY  12/21/2019   Procedure: BIOPSY;  Surgeon: Rush Landmark Telford Nab., MD;  Location: [facility];  Service: Gastroenterology;;  . BRONCHOSCOPY  10/2014  . CARDIAC CATHETERIZATION     05/07/12  . CARDIOVERSION     x2  . COLONOSCOPY    . DG BIOPSY LUNG Left 10/2014   FNA - Adenocarcinoma   . ESOPHAGOGASTRODUODENOSCOPY (EGD) WITH PROPOFOL N/A 12/21/2019   Procedure: ESOPHAGOGASTRODUODENOSCOPY (EGD) WITH PROPOFOL;  Surgeon: Rush Landmark Telford Nab., MD;  Location: [facility];  Service: Gastroenterology;  Laterality: N/A;  . EUS N/A 12/21/2019   Procedure: UPPER ENDOSCOPIC ULTRASOUND (EUS) RADIAL;  Surgeon: Rush Landmark Telford Nab., MD;  Location: [facility];  Service: Gastroenterology;  Laterality: N/A;  . FEMUR IM NAIL Left 02/19/2017  . FEMUR IM NAIL Left 02/19/2017    Procedure: INTRAMEDULLARY (IM) NAIL FEMORAL;  Surgeon: Marchia Bond, MD;  Location: [facility];  Service: Orthopedics;  Laterality: Left;  . IR FLUORO GUIDE PORT INSERTION RIGHT  07/03/2017  . IR US GUIDE VASC ACCESS RIGHT  07/03/2017  . LUNG CANCER SURGERY Left 12/2014   Wedge Resection   . MULTIPLE TOOTH EXTRACTIONS    . Status post TURP    . TONSILLECTOMY      REVIEW OF SYSTEMS:  Constitutional: positive for fatigue Eyes: positive for irritation and redness Ears, nose, mouth, throat, and face: positive for nasal congestion Respiratory: negative Cardiovascular: negative Gastrointestinal: negative Genitourinary:negative Integument/breast: negative Hematologic/lymphatic: negative Musculoskeletal:negative Neurological: negative Behavioral/Psych: negative Endocrine: negative Allergic/Immunologic: negative   PHYSICAL EXAMINATION: General appearance: alert, cooperative, fatigued and no distress Head: Normocephalic, without obvious abnormality, atraumatic Neck: no adenopathy, no JVD, supple, symmetrical, trachea midline and thyroid not enlarged, symmetric, no tenderness/mass/nodules Lymph nodes: Cervical, supraclavicular, and axillary nodes normal. Resp: clear to auscultation bilaterally Back: symmetric, no curvature. ROM normal. No CVA tenderness. Cardio: regular rate and rhythm, S1, S2 normal, no murmur, click, rub or gallop GI: soft, non-tender; bowel sounds normal; no masses,  no organomegaly Extremities: extremities normal, atraumatic, no cyanosis or edema Neurologic: Alert and oriented X 3, normal strength and tone. Normal symmetric reflexes. Normal coordination and gait  ECOG PERFORMANCE STATUS: 1 - Symptomatic but completely ambulatory  Blood pressure 113/69, pulse 72, temperature 99 F (37.2 C), temperature source Tympanic, resp. rate 18, height $RemoveBe'6\' 1"'gppsEXnnx$  (1.854 m), weight 221 lb 4.8 oz (100.4 kg), SpO2 97 %.  LABORATORY DATA: Lab Results  Component Value Date   WBC 8.0  12/31/2019   HGB 13.8 12/31/2019   HCT 39.3 12/31/2019   MCV 95.4 12/31/2019   PLT 169 12/31/2019      Chemistry      Component Value Date/Time   NA 137 12/31/2019 1130   NA 136 04/04/2017 1141   K 4.3 12/31/2019 1130   K 4.4 04/04/2017 1141   CL 107 12/31/2019 1130   CO2 25 12/31/2019 1130   CO2 24 04/04/2017 1141   BUN 14 12/31/2019 1130   BUN 21.1 04/04/2017 1141   CREATININE 0.94 12/31/2019 1130   CREATININE 1.1 04/04/2017 1141      Component Value Date/Time   CALCIUM 9.1 12/31/2019 1130   CALCIUM 9.1 04/04/2017 1141   ALKPHOS 117 12/31/2019 1130   ALKPHOS 89 04/04/2017 1141   AST 13 (L) 12/31/2019 1130   AST 17 04/04/2017 1141   ALT 16 12/31/2019 1130   ALT 18 04/04/2017 1141   BILITOT 0.7 12/31/2019 1130   BILITOT 0.67 04/04/2017 1141       RADIOGRAPHIC STUDIES: No results found.  ASSESSMENT AND PLAN: This is a 69 years old white male with metastatic non-small cell lung cancer, adenocarcinoma with no actionable mutations and PDL 1 expression of 5% that was initially diagnosed as stage IIIa non-small cell lung cancer, adenocarcinoma status post left lower lobectomy with lymph node dissection followed by a course of concurrent chemoradiation completed in January 2016.  The patient had evidence for disease metastasis in October 2018 with metastatic disease to the left femur as well as left supraclavicular and right paratracheal lymph nodes. The patient is currently on systemic chemotherapy initially was with carboplatin, Alimta and Keytruda.  Carboplatin was discontinued secondary to hypersensitivity reaction starting from cycle #2.   He was also treated with 3 cycles of maintenance Alimta and Ketruda (pembrolizumab) but Alimta was discontinued secondary to intolerance. He is currently undergoing treatment with maintenance Keytruda as a single agent status post 46 cycles.  The patient has been tolerating this treatment well except for recently increasing fatigue and  weakness as well as the nasal congestion and conjunctivitis.  He was started by his primary care physician recently on venlafaxine. I recommended for the patient to delay his cycle of immunotherapy until November 1 after completion of the imaging studies to make sure he has no disease progression. For the fatigue and weakness, I encouraged the patient to increase his exercise and I also started him on a Medrol Dosepak. For the conjunctivitis, he will continue with the eyedrops as prescribed by his primary care physician and if no improvement we will refer him to ophthalmology. For the suspicious pancreatitis, he is followed by gastroenterology and currently on treatment with Creon. The patient will come back for follow-up visit on November 1 for evaluation before resuming his treatment. He was advised to call immediately if he has any other concerning symptoms in the interval. The patient voices understanding of current disease status and treatment options and is in agreement with the current care plan. All questions  were answered. The patient knows to call the clinic with any problems, questions or concerns. We can certainly see the patient much sooner if necessary.  Disclaimer: This note was dictated with voice recognition software. Similar sounding words can inadvertently be transcribed and may not be corrected upon review.

## 2020-01-28 ENCOUNTER — Ambulatory Visit (INDEPENDENT_AMBULATORY_CARE_PROVIDER_SITE_OTHER): Payer: Medicare HMO | Admitting: Gastroenterology

## 2020-01-28 ENCOUNTER — Telehealth: Payer: Self-pay

## 2020-01-28 ENCOUNTER — Encounter: Payer: Self-pay | Admitting: Gastroenterology

## 2020-01-28 VITALS — BP 90/60 | HR 57 | Ht 73.0 in | Wt 218.0 lb

## 2020-01-28 DIAGNOSIS — K8681 Exocrine pancreatic insufficiency: Secondary | ICD-10-CM

## 2020-01-28 DIAGNOSIS — K8500 Idiopathic acute pancreatitis without necrosis or infection: Secondary | ICD-10-CM | POA: Diagnosis not present

## 2020-01-28 DIAGNOSIS — K2100 Gastro-esophageal reflux disease with esophagitis, without bleeding: Secondary | ICD-10-CM

## 2020-01-28 DIAGNOSIS — K209 Esophagitis, unspecified without bleeding: Secondary | ICD-10-CM

## 2020-01-28 DIAGNOSIS — K861 Other chronic pancreatitis: Secondary | ICD-10-CM

## 2020-01-28 MED ORDER — PANTOPRAZOLE SODIUM 40 MG PO TBEC: 40.0000 mg | DELAYED_RELEASE_TABLET | Freq: Two times a day (BID) | ORAL | 4 refills | Status: DC

## 2020-01-28 NOTE — Progress Notes (Signed)
[facility] OFFICE PROGRESS NOTE  Buzzy Han, MD [place] [address]  DIAGNOSIS: Metastatic non-small cell lung cancer initially diagnosed as stage IIIA (T2a, N2, M0) non-small cell lung cancer, poorly differentiated adenocarcinoma presented with left lower lobe lung mass in addition to mediastinal lymphadenopathy.  The patient was diagnosed with metastatic disease involving the left femur as well as left supraclavicular nodal metastases and right paratracheal lymphadenopathy in October 2018.  Biomarker Findings Microsatellite Status - MS-Stable Tumor Mutational Burden - TMB-Low (3 Muts/Mb) Genomic Findings For a complete list of the genes assayed, please refer to the Appendix. STK11 P277f*6 [id number] site 1093+2I>ZDAXX E374* MLL2 [id number] NBN K23357f NOTCH2 [id number] splice site [id number]>ADisease relevant genes with no reportable alterations: EGFR, KRAS, ALK, BRAF, MET, RET, ERBB2, ROS1   PDL1 expression 5%  PRIOR THERAPY: 1) status post wedge resection of the left lower lobe lung mass as well as AP window lymph node dissection but there was residual metastatic mediastinal lymphadenopathy that could not be resected. 2) a course of concurrent chemoradiation [place] carboplatin [place]  3) status post palliative radiotherapy to the left femur metastatic bone disease. 4)  Systemic chemotherapy with carboplatin for AUC of 5, Alimta 500 mg/M2 and Keytruda 200 mg IV every 3 weeks.  First dose March 07, 2017.  Carboplatin was discontinued during cycle #2 secondary to hypersensitivity reaction. 5) status post 2 cycles of maintenance treatment with Alimta and Ketruda (pembrolizumab).  Alimta was discontinued secondary to intolerance.  CURRENT THERAPY: Maintenance treatment with single agent Ketruda (pembrolizumab) status post 49 cycles.  INTERVAL HISTORY: Victor Huber 69o. male returns to the clinic today for a follow-up visit. At the patient's appointment last week, he was endorsing weakness, chest congestion, and conjunctivitis. He received anti-histamine eyedrops via his PCP. He saw an optometrist this morning who prescribed steroid eyedrops. The patient was scheduled for restaging CT scan on 01/29/2020. Dr. MohJulien Nordmanncommended delaying the patient's treatment by 1 week until he can have his PET scan performed. In the meantime, the patient also was given a prescription for Medrol Dosepak to help with his symptoms. The patient states he felt much better since completing his medrol dosepak. He states his nasal congestion and chest congestion improved significantly. His conjunctivitis had improved but regressed after completing the steroids which is why he saw his optometrist this AM. He also notes that his right knee feels sore that started a few days ago after completing his steroids. Denies any strenuous activity.  Otherwise, the patient is feeling at his baseline. He denies any fevers, chills, night sweats, or weight loss. He reports his baseline dyspnea on exertion and dry cough. He denies any chest pain or hemoptysis. He is followed by pulmonology for his COPD. He denies any headaches or visual changes. He denies any rashes or skin changes. He denies any nausea, vomiting, or constipation. He continues to have his baseline loose stools since having pancreatitis a few months ago. The patient is currently taking Creon as prescribed by his gastroenterologist. The patient recently had a restaging CT scan performed. He is here today for evaluation and to review his scan results before starting cycle #50.   MEDICAL HISTORY: Past Medical History:  Diagnosis Date  . Adenocarcinoma of left lung, stage 3 ([place]/16/2018  . Anxiety   . Arthritis   . Atrial fibrillation ([place]/16/2018  . Atrial fibrillation ([place] . Cancer,  metastatic to bone ([place])   . COPD (chronic  obstructive pulmonary disease) ([place]) 08/15/2016  . Depression   . Dysphagia   . Dysrhythmia    a-fib  . History of chemotherapy   . History of radiation therapy   . Hyperlipidemia   . Hypothyroid 08/15/2016  . Longstanding persistent atrial fibrillation ([place]) 08/29/2016  . Pathologic fracture    left femur  . Pneumonitis   . S/P TURP 08/15/2016  . Wears glasses   . Wears hearing aid in both ears     ALLERGIES:  is allergic to carboplatin, flecainide, and debrox [carbamide peroxide].  MEDICATIONS:  Current Outpatient Medications  Medication Sig Dispense Refill  . albuterol (VENTOLIN HFA) 108 (90 Base) MCG/ACT inhaler Inhale 1-2 puffs into the lungs every 4 (four) hours as needed for wheezing or shortness of breath. 3 month supply 24 g 3  . atenolol (TENORMIN) 50 MG tablet TAKE 1 TABLET (50 MG TOTAL) BY MOUTH DAILY. 90 tablet 3  . atorvastatin (LIPITOR) 40 MG tablet Take 1 tablet (40 mg total) by mouth daily. 90 tablet 2  . CREON 36000-114000 units CPEP capsule Take 2 capsules (72,000 Units total) by mouth 3 (three) times daily before meals. And 2 capsules daily with snacks 540 capsule 3  . ELIQUIS 5 MG TABS tablet TAKE 1 TABLET TWICE DAILY (Patient taking differently: Take 5 mg by mouth 2 (two) times daily. ) 180 tablet 1  . ibuprofen (ADVIL,MOTRIN) 400 MG tablet Take 400 mg by mouth every 4 (four) hours as needed for moderate pain.    Marland Kitchen levothyroxine (SYNTHROID) 150 MCG tablet Take 150 mcg by mouth daily before breakfast. Take with 25 mcg to equal 175 mcg daily    . Oxycodone HCl 10 MG TABS Take one daily as needed for severe back pain. (Patient taking differently: Take 10 mg by mouth daily as needed (pain). ) 30 tablet 0  . pantoprazole (PROTONIX) 40 MG tablet Take 1 tablet (40 mg total) by mouth 2 (two) times daily before a meal. 60 tablet 4  . tamsulosin (FLOMAX) 0.4 MG CAPS capsule Take 1 capsule (0.4 mg total) by mouth daily. 90 capsule 4  . venlafaxine (EFFEXOR) 37.5 MG tablet       No current facility-administered medications for this visit.    SURGICAL HISTORY:  Past Surgical History:  Procedure Laterality Date  . BIOPSY  12/21/2019   Procedure: BIOPSY;  Surgeon: Rush Landmark Telford Nab., MD;  Location: [facility];  Service: Gastroenterology;;  . BRONCHOSCOPY  10/2014  . CARDIAC CATHETERIZATION     05/07/12  . CARDIOVERSION     x2  . COLONOSCOPY    . DG BIOPSY LUNG Left 10/2014   FNA - Adenocarcinoma   . ESOPHAGOGASTRODUODENOSCOPY (EGD) WITH PROPOFOL N/A 12/21/2019   Procedure: ESOPHAGOGASTRODUODENOSCOPY (EGD) WITH PROPOFOL;  Surgeon: Rush Landmark Telford Nab., MD;  Location: [facility];  Service: Gastroenterology;  Laterality: N/A;  . EUS N/A 12/21/2019   Procedure: UPPER ENDOSCOPIC ULTRASOUND (EUS) RADIAL;  Surgeon: Rush Landmark Telford Nab., MD;  Location: [facility];  Service: Gastroenterology;  Laterality: N/A;  . FEMUR IM NAIL Left 02/19/2017  . FEMUR IM NAIL Left 02/19/2017   Procedure: INTRAMEDULLARY (IM) NAIL FEMORAL;  Surgeon: Marchia Bond, MD;  Location: [facility];  Service: Orthopedics;  Laterality: Left;  . IR FLUORO GUIDE PORT INSERTION RIGHT  07/03/2017  . IR US GUIDE VASC ACCESS RIGHT  07/03/2017  . LUNG CANCER SURGERY Left 12/2014   Wedge Resection   . MULTIPLE TOOTH EXTRACTIONS    .  Status post TURP    . TONSILLECTOMY      REVIEW OF SYSTEMS:   Review of Systems  Constitutional: Positive for fatigue. Negative for appetite change, chills, fever and unexpected weight change.  HENT: Negative for mouth sores, nosebleeds, sore throat and trouble swallowing.   Eyes: Positive for bilateral pink conjunctiva. Negative for icterus, diplopia, visual field defects, or photophobia Respiratory: Positive for baseline dyspnea on exertion. Negative for cough, hemoptysis,and wheezing.   Cardiovascular: Negative for chest pain and leg swelling.  Gastrointestinal: Positive for baseline diarrhea since having pancreatitis. Negative for abdominal pain, constipation,   nausea and vomiting.  Genitourinary: Negative for bladder incontinence, difficulty urinating, dysuria, frequency and hematuria.   Musculoskeletal: Positive for right knee pain. Negative for back pain, gait problem, neck pain and neck stiffness.  Skin: Negative for itching and rash.  Neurological: Negative for dizziness, extremity weakness, gait problem, headaches, light-headedness and seizures.  Hematological: Negative for adenopathy. Does not bruise/bleed easily.  Psychiatric/Behavioral: Negative for confusion, depression and sleep disturbance. The patient is not nervous/anxious.     PHYSICAL EXAMINATION:  Blood pressure 103/83, pulse 97, temperature 98.7 F (37.1 C), temperature source Tympanic, resp. rate 18, height 6' 1"  (1.854 m), weight 221 lb 12.8 oz (100.6 kg), SpO2 97 %.  ECOG PERFORMANCE STATUS: 1 - Symptomatic but completely ambulatory  Physical Exam  Constitutional: Oriented to person, place, and time and well-developed, well-nourished, and in no distress.  HENT:  Head: Normocephalic and atraumatic.  Mouth/Throat: Oropharynx is clear and moist. No oropharyngeal exudate.  Eyes: Conjunctivae are pink. Right eye exhibits no discharge. Left eye exhibits no discharge. No scleral icterus.  Neck: Normal range of motion. Neck supple.  Cardiovascular: Normal rate, irregular rhythm, normal heart sounds and intact distal pulses.   Pulmonary/Chest: Effort normal and breath sounds normal. No respiratory distress. No wheezes. No rales.  Abdominal: Soft. Bowel sounds are normal. Exhibits no distension and no mass. There is no tenderness.  Musculoskeletal: Normal range of motion. Exhibits no edema.  Lymphadenopathy:    No cervical adenopathy.  Neurological: Alert and oriented to person, place, and time. Exhibits normal muscle tone. Gait normal. Coordination normal.  Skin: Skin is warm and dry. No rash noted. Not diaphoretic. No erythema. No pallor.  Psychiatric: Mood, memory and judgment  normal.  Vitals reviewed.  LABORATORY DATA: Lab Results  Component Value Date   WBC 11.1 (H) 02/01/2020   HGB 13.6 02/01/2020   HCT 38.4 (L) 02/01/2020   MCV 96.7 02/01/2020   PLT 155 02/01/2020      Chemistry      Component Value Date/Time   NA 137 02/01/2020 1432   NA 136 04/04/2017 1141   K 4.1 02/01/2020 1432   K 4.4 04/04/2017 1141   CL 107 02/01/2020 1432   CO2 24 02/01/2020 1432   CO2 24 04/04/2017 1141   BUN 13 02/01/2020 1432   BUN 21.1 04/04/2017 1141   CREATININE 0.79 02/01/2020 1432   CREATININE 1.1 04/04/2017 1141      Component Value Date/Time   CALCIUM 8.6 (L) 02/01/2020 1432   CALCIUM 9.1 04/04/2017 1141   ALKPHOS 96 02/01/2020 1432   ALKPHOS 89 04/04/2017 1141   AST 13 (L) 02/01/2020 1432   AST 17 04/04/2017 1141   ALT 21 02/01/2020 1432   ALT 18 04/04/2017 1141   BILITOT 0.9 02/01/2020 1432   BILITOT 0.67 04/04/2017 1141       RADIOGRAPHIC STUDIES:  CT Chest W Contrast  Result Date: 01/29/2020  CLINICAL DATA:  Non-small cell lung cancer staging EXAM: CT CHEST, ABDOMEN, AND PELVIS WITH CONTRAST TECHNIQUE: Multidetector CT imaging of the chest, abdomen and pelvis was performed following the standard protocol during bolus administration of intravenous contrast. CONTRAST:  133m OMNIPAQUE IOHEXOL 300 MG/ML  SOLN COMPARISON:  October 07, 2019 FINDINGS: CT CHEST FINDINGS Cardiovascular: RIGHT-sided Port-A-Cath terminates at the caval to atrial junction as before. Heart size remains enlarged. Small pericardial effusion similar to the prior exam. Aortic atheromatous changes, mild and unchanged. Signs of coronary artery calcifications similar to the prior study. Central pulmonary vasculature unchanged and otherwise unremarkable on venous phase assessment aside from LEFT hilar soft tissue postoperative changes in the LEFT chest, see below. Mediastinum/Nodes: No thoracic inlet, axillary or new mediastinal adenopathy. The mild enlargement of subcarinal nodal tissue is  diminished in size 11 mm short axis (image 33, series 2) previously 17 mm short axis. RIGHT paratracheal lymph node measuring 9 mm (image 19, series 2) previously approximately 11 mm. Mild esophageal thickening is unchanged. Lungs/Pleura: Marked pulmonary emphysema and mild moderate septal thickening scattered throughout the chest. Post treatment changes about the LEFT hilum these are unchanged. The small nodule in the LEFT upper chest seen on the previous study has resolved. The sub solid nodule along the major fissure in the RIGHT chest has resolved in the interval as well. No new suspicious pulmonary nodule. Musculoskeletal: No chest wall mass. See below for full musculoskeletal detail. CT ABDOMEN PELVIS FINDINGS Hepatobiliary: No focal, suspicious hepatic lesion. Portal vein is patent. Cholelithiasis layering in the dependent aspect of the gallbladder similar to prior imaging. Pancreas: Mild pancreatic atrophy since previous imaging. No ductal dilation. No current signs of inflammation. Spleen: Spleen is normal size without focal lesion. Adrenals/Urinary Tract: Adrenal glands are normal. Renal sinus cysts on the LEFT and benign cortical cyst along the anterior LEFT kidney. Urinary bladder under distended but otherwise unremarkable. Stomach/Bowel: No acute gastrointestinal process. Stool throughout the colon. Colonic diverticulosis. Vascular/Lymphatic: Calcified and noncalcified atheromatous plaque in the abdominal aorta. Insert of abdominal lymphadenopathy. No signs of aortic dilation. No pelvic sidewall lymphadenopathy. Reproductive: Prostatomegaly similar to prior imaging. Other: Small fat containing bilateral inguinal hernias. Musculoskeletal: No acute bone finding. No destructive bone process. Post LEFT thoracotomy with changes in the ribs related to prior surgery. Post LEFT femoral ORIF. IMPRESSION: 1. Post treatment changes about the LEFT hilum are unchanged. No signs of new or suspicious findings in the  chest. 2. Interval resolution of small nodules in the LEFT upper chest and along the RIGHT major fissure. 3. Decrease in size of RIGHT hilar and mediastinal lymph nodes, attention on follow-up. 4. No evidence of metastatic disease in the abdomen or pelvis. 5. Stable small pericardial effusion. 6. Cholelithiasis. 7. Colonic diverticulosis without evidence of acute diverticulitis. 8. Emphysema and aortic atherosclerosis. Aortic Atherosclerosis (ICD10-I70.0) and Emphysema (ICD10-J43.9). Electronically Signed   By: GZetta BillsM.D.   On: 01/29/2020 17:29   CT Abdomen Pelvis W Contrast  Result Date: 01/29/2020 CLINICAL DATA:  Non-small cell lung cancer staging EXAM: CT CHEST, ABDOMEN, AND PELVIS WITH CONTRAST TECHNIQUE: Multidetector CT imaging of the chest, abdomen and pelvis was performed following the standard protocol during bolus administration of intravenous contrast. CONTRAST:  1091mOMNIPAQUE IOHEXOL 300 MG/ML  SOLN COMPARISON:  October 07, 2019 FINDINGS: CT CHEST FINDINGS Cardiovascular: RIGHT-sided Port-A-Cath terminates at the caval to atrial junction as before. Heart size remains enlarged. Small pericardial effusion similar to the prior exam. Aortic atheromatous changes, mild and unchanged. Signs  of coronary artery calcifications similar to the prior study. Central pulmonary vasculature unchanged and otherwise unremarkable on venous phase assessment aside from LEFT hilar soft tissue postoperative changes in the LEFT chest, see below. Mediastinum/Nodes: No thoracic inlet, axillary or new mediastinal adenopathy. The mild enlargement of subcarinal nodal tissue is diminished in size 11 mm short axis (image 33, series 2) previously 17 mm short axis. RIGHT paratracheal lymph node measuring 9 mm (image 19, series 2) previously approximately 11 mm. Mild esophageal thickening is unchanged. Lungs/Pleura: Marked pulmonary emphysema and mild moderate septal thickening scattered throughout the chest. Post treatment  changes about the LEFT hilum these are unchanged. The small nodule in the LEFT upper chest seen on the previous study has resolved. The sub solid nodule along the major fissure in the RIGHT chest has resolved in the interval as well. No new suspicious pulmonary nodule. Musculoskeletal: No chest wall mass. See below for full musculoskeletal detail. CT ABDOMEN PELVIS FINDINGS Hepatobiliary: No focal, suspicious hepatic lesion. Portal vein is patent. Cholelithiasis layering in the dependent aspect of the gallbladder similar to prior imaging. Pancreas: Mild pancreatic atrophy since previous imaging. No ductal dilation. No current signs of inflammation. Spleen: Spleen is normal size without focal lesion. Adrenals/Urinary Tract: Adrenal glands are normal. Renal sinus cysts on the LEFT and benign cortical cyst along the anterior LEFT kidney. Urinary bladder under distended but otherwise unremarkable. Stomach/Bowel: No acute gastrointestinal process. Stool throughout the colon. Colonic diverticulosis. Vascular/Lymphatic: Calcified and noncalcified atheromatous plaque in the abdominal aorta. Insert of abdominal lymphadenopathy. No signs of aortic dilation. No pelvic sidewall lymphadenopathy. Reproductive: Prostatomegaly similar to prior imaging. Other: Small fat containing bilateral inguinal hernias. Musculoskeletal: No acute bone finding. No destructive bone process. Post LEFT thoracotomy with changes in the ribs related to prior surgery. Post LEFT femoral ORIF. IMPRESSION: 1. Post treatment changes about the LEFT hilum are unchanged. No signs of new or suspicious findings in the chest. 2. Interval resolution of small nodules in the LEFT upper chest and along the RIGHT major fissure. 3. Decrease in size of RIGHT hilar and mediastinal lymph nodes, attention on follow-up. 4. No evidence of metastatic disease in the abdomen or pelvis. 5. Stable small pericardial effusion. 6. Cholelithiasis. 7. Colonic diverticulosis without  evidence of acute diverticulitis. 8. Emphysema and aortic atherosclerosis. Aortic Atherosclerosis (ICD10-I70.0) and Emphysema (ICD10-J43.9). Electronically Signed   By: Zetta Bills M.D.   On: 01/29/2020 17:29     ASSESSMENT/PLAN:  This is a very pleasant 69 year old Caucasian male with metastatic non-small cell lung cancer, adenocarcinoma. He was initially diagnosed as astage IIIa. He presented with a left lower lobe lung mass in addition to mediastinal lymphadenopathy. He was diagnosed in January 2016. He is status post left lower lobectomy with lymph node dissection followed by concurrent chemoradiation. His PDL 1 expression is 5%.  He was diagnosed with metastatic disease involving the leftfemuras well as a left supraclavicular nodalmetastasis and right paratracheal lymphadenopathy in October 2018  Hewasstarted on systemic chemotherapy with carboplatin, Alimta, and Keytruda. Carboplatin was discontinued after cycle #2 due to a hypersensitivity reaction. Alimta was also discontinued after cycle #4 due to intolerance. The patient has been on maintenance treatment with single agent Keytruda for49cycles.   The patient was seen with Dr. Julien Nordmann today. The patient recently had a restaging CT scan performed.  Dr. Julien Nordmann personally independently reviewed the scan and discussed the results with the patient today.  The scan did not show any evidence of disease progression.  Dr. Julien Nordmann gave  the patient the option of proceeding on his current treatment with cycle #50 of keytruda vs taking a break from treatment. The patient opted to continue on his treatment.   He will proceed with cycle #50 today scheduled.   We will see him back for follow-up visit in 3 weeks for evaluation before starting cycle #51.  The patient was advised to call immediately if he has any concerning symptoms in the interval. The patient voices understanding of current disease status and treatment options and is in  agreement with the current care plan. All questions were answered. The patient knows to call the clinic with any problems, questions or concerns. We can certainly see the patient much sooner if necessary   No orders of the defined types were placed in this encounter.    Shakelia Scrivner L Makella Buckingham, PA-C 02/01/20  ADDENDUM: Hematology/Oncology Attending: I had a face-to-face encounter with the patient today.  I recommended his care plan.  This is a very pleasant 69 years old white male with metastatic non-small cell lung cancer, adenocarcinoma. The patient is currently undergoing treatment with immunotherapy with Keytruda as a single agent status post 49 cycles.  He has been tolerating his treatment well.  His treatment was delayed from last week secondary to chest congestion as well as conjunctivitis.  The patient was treated with a course of Solu-Medrol last week and he felt better but his symptoms recurred back after discontinuation of the steroid.  He was seen by his optometrist and was given steroid allergy drops. He has not started these drops yet. The patient had repeat CT scan of the chest, abdomen pelvis performed recently.  I personally and independently reviewed the scans and discussed the results with the patient today. His scan showed no concerning findings for disease progression. I recommended for the patient to continue his current treatment with Keytruda. He will come back for follow-up visit in 3 weeks for evaluation before the next cycle of his treatment. He was advised to call immediately if he has any concerning symptoms in the interval.  Disclaimer: This note was dictated with voice recognition software. Similar sounding words can inadvertently be transcribed and may be missed upon review. Eilleen Kempf, MD 02/01/20

## 2020-01-28 NOTE — Patient Instructions (Addendum)
We have sent the following medications to your pharmacy for you to pick up at your convenience: Pantoprazole   Continue your Pantoprazole 40mg  - twice daily.   You will be contaced by our office prior to your procedure for directions on holding your Eliquis.  If you do not hear from our office 1 week prior to your scheduled procedure, please call [phone] to discuss.   If you are age 71 or older, your body mass index should be between 23-30. Your Body mass index is 28.76 kg/m. If this is out of the aforementioned range listed, please consider follow up with your Primary Care Provider.  If you are age 47 or younger, your body mass index should be between 19-25. Your Body mass index is 28.76 kg/m. If this is out of the aformentioned range listed, please consider follow up with your Primary Care Provider.   Please call office and let us know what your dosing of Creon is currently. Dr.[name] may adjust if needed.   Thank you for choosing me and [facility].  Dr. Rush Landmark

## 2020-01-28 NOTE — Telephone Encounter (Signed)
Request for surgical clearance:     Endoscopy Procedure  What type of surgery is being performed?     EGD w/ dil   When is this surgery scheduled?     03/11/20  What type of clearance is required ?   Pharmacy  Are there any medications that need to be held prior to surgery and how long? Eliquis x2 days prior to procedure  Practice name and name of physician performing surgery?      [facility]-Dr. Rush Landmark Helmut Muster   What is your office phone and fax number?      Phone- [phone]  [phone]  Anesthesia type (None, local, MAC, general) ?       MAC

## 2020-01-29 ENCOUNTER — Other Ambulatory Visit: Payer: Self-pay

## 2020-01-29 ENCOUNTER — Ambulatory Visit (HOSPITAL_COMMUNITY)
Admission: RE | Admit: 2020-01-29 | Discharge: 2020-01-29 | Disposition: A | Discharge status: Home / Self Care | Payer: Medicare HMO | Source: Ambulatory Visit | Attending: Internal Medicine | Admitting: Internal Medicine

## 2020-01-29 DIAGNOSIS — C3492 Malignant neoplasm of unspecified part of left bronchus or lung: Secondary | ICD-10-CM | POA: Diagnosis present

## 2020-01-29 DIAGNOSIS — C3490 Malignant neoplasm of unspecified part of unspecified bronchus or lung: Secondary | ICD-10-CM | POA: Insufficient documentation

## 2020-01-29 MED ORDER — IOHEXOL 300 MG/ML  SOLN
100.0000 mL | Freq: Once | INTRAMUSCULAR | Status: AC | PRN
  Administered 2020-01-29: 100 mL via INTRAVENOUS

## 2020-01-29 MED ORDER — IOHEXOL 9 MG/ML PO SOLN
ORAL | Status: AC
  Filled 2020-01-29: qty 1000

## 2020-01-29 MED ORDER — IOHEXOL 9 MG/ML PO SOLN
1000.0000 mL | ORAL | Status: AC
  Administered 2020-01-29: 1000 mL via ORAL

## 2020-01-29 NOTE — Telephone Encounter (Signed)
   Primary Cardiologist: Skeet Latch, MD  Chart reviewed as part of pre-operative protocol coverage.   Per pharmacy recommendations, patient can hold eliquis 2 days prior to his upcoming endoscopy with plans to restart as soon as he is cleared to do so by his gastroenterologist.  I will route this recommendation to the requesting party via [facility] fax function and remove from pre-op pool.  Please call with questions.  Abigail Butts, PA-C 01/29/2020, 11:07 AM

## 2020-01-29 NOTE — Telephone Encounter (Signed)
Patient with diagnosis of afib on Eliquis for anticoagulation.    Procedure: endoscopy Date of procedure: 03/11/20  CHA2DS2-VASc Score = 1  This indicates a 0.6% annual risk of stroke. The patient's score is based upon: CHF History: 0 HTN History: 0 Diabetes History: 0 Stroke History: 0 Vascular Disease History: 0 Age Score: 1 Gender Score: 0    CrCl 132mL/min Platelet count 169K  Per office protocol, patient can hold Eliquis for 2 days prior to procedure.

## 2020-01-31 ENCOUNTER — Encounter: Payer: Self-pay | Admitting: Gastroenterology

## 2020-01-31 DIAGNOSIS — K861 Other chronic pancreatitis: Secondary | ICD-10-CM | POA: Insufficient documentation

## 2020-01-31 DIAGNOSIS — K8681 Exocrine pancreatic insufficiency: Secondary | ICD-10-CM | POA: Insufficient documentation

## 2020-01-31 DIAGNOSIS — K8500 Idiopathic acute pancreatitis without necrosis or infection: Secondary | ICD-10-CM | POA: Insufficient documentation

## 2020-01-31 DIAGNOSIS — K209 Esophagitis, unspecified without bleeding: Secondary | ICD-10-CM | POA: Insufficient documentation

## 2020-01-31 NOTE — Progress Notes (Signed)
[facility] VISIT   Primary Care Provider [address], [place] [id number] [phone]  Patient Profile: Victor Huber is a 69 y.o. male with a pmh significant for asthma/COPD, metastatic lung cancer, atrial fibrillation (on anticoagulation), BPH (status post TURP), GERD, chronic pancreatitis (based on EUS), EPI, idiopathic pancreatitis.  The patient presents to the [facility] for an evaluation and management of problem(s) noted below:  Problem List 1. Esophagitis   2. Exocrine pancreatic insufficiency   3. Idiopathic chronic pancreatitis ([place])   4. History of idiopathic acute pancreatitis     History of Present Illness Please see initial consultation note and progress notes by NP [facility] for full details of HPI.  Interval History The patient is accompanied by his wife today.  The patient states that he has been doing well since our endoscopic ultrasound.  Does not describe significant history of prior GERD but now that he has been on PPI therapy he feels his swallowing is much improved.  He has questions in regards to whether chronic pancreatitis will persist or if he will have any recurrence of his episode of pancreatitis and if he will heal his pancreas.  Remains on PERT therapy with 2 pills at meals and 1 with each snack although we had wanted to increase him.  He is on patient assistance to [place].  Patient is eating well.  Does not describe significant abdominal pain or discomfort.  He is not having unintentional weight loss.  He denies any jaundice or dark urine.  GI Review of Systems Positive as above Negative for pyrosis, dysphagia, odynophagia, nausea, vomiting, change in bowel habits, melena, hematochezia  Review of Systems General: Denies fevers/chills/weight loss unintentionally Cardiovascular: Denies chest pain Pulmonary: Denies shortness of breath Gastroenterological: See  HPI Genitourinary: Denies darkened urine Hematological: Positive for history of easy bruising/bleeding due to anticoagulation Dermatological: Denies jaundice Psychological: Mood is stable   Medications Current Outpatient Medications  Medication Sig Dispense Refill  . albuterol (VENTOLIN HFA) 108 (90 Base) MCG/ACT inhaler Inhale 1-2 puffs into the lungs every 4 (four) hours as needed for wheezing or shortness of breath. 3 month supply 24 g 3  . atenolol (TENORMIN) 50 MG tablet TAKE 1 TABLET (50 MG TOTAL) BY MOUTH DAILY. 90 tablet 3  . atorvastatin (LIPITOR) 40 MG tablet Take 1 tablet (40 mg total) by mouth daily. 90 tablet 2  . CREON 36000-114000 units CPEP capsule Take 2 capsules (72,000 Units total) by mouth 3 (three) times daily before meals. And 2 capsules daily with snacks 540 capsule 3  . ELIQUIS 5 MG TABS tablet TAKE 1 TABLET TWICE DAILY (Patient taking differently: Take 5 mg by mouth 2 (two) times daily. ) 180 tablet 1  . ibuprofen (ADVIL,MOTRIN) 400 MG tablet Take 400 mg by mouth every 4 (four) hours as needed for moderate pain.    Marland Kitchen levothyroxine (SYNTHROID) 150 MCG tablet Take 150 mcg by mouth daily before breakfast. Take with 25 mcg to equal 175 mcg daily    . Oxycodone HCl 10 MG TABS Take one daily as needed for severe back pain. (Patient taking differently: Take 10 mg by mouth daily as needed (pain). ) 30 tablet 0  . pantoprazole (PROTONIX) 40 MG tablet Take 1 tablet (40 mg total) by mouth 2 (two) times daily before a meal. 60 tablet 4  . tamsulosin (FLOMAX) 0.4 MG CAPS capsule Take 1 capsule (0.4 mg total) by mouth daily. 90 capsule 4  . venlafaxine (  EFFEXOR) 37.5 MG tablet      No current facility-administered medications for this visit.    Allergies Allergies  Allergen Reactions  . Carboplatin Itching, Nausea And Vomiting and Other (See Comments)    Flushing  . Flecainide Hypertension    CAUSED HEART ISSUES   . Debrox [Carbamide Peroxide]     Swelling in ear canal.      Histories Past Medical History:  Diagnosis Date  . Adenocarcinoma of left lung, stage 3 ([place]) 08/15/2016  . Anxiety   . Arthritis   . Atrial fibrillation ([place]) 08/15/2016  . Atrial fibrillation ([place])   . Cancer, metastatic to bone ([place])   . COPD (chronic obstructive pulmonary disease) ([place]) 08/15/2016  . Depression   . Dysphagia   . Dysrhythmia    a-fib  . History of chemotherapy   . History of radiation therapy   . Hyperlipidemia   . Hypothyroid 08/15/2016  . Longstanding persistent atrial fibrillation ([place]) 08/29/2016  . Pathologic fracture    left femur  . Pneumonitis   . S/P TURP 08/15/2016  . Wears glasses   . Wears hearing aid in both ears    Past Surgical History:  Procedure Laterality Date  . BIOPSY  12/21/2019   Procedure: BIOPSY;  Surgeon: Rush Landmark Telford Nab., MD;  Location: [facility];  Service: Gastroenterology;;  . BRONCHOSCOPY  10/2014  . CARDIAC CATHETERIZATION     05/07/12  . CARDIOVERSION     x2  . COLONOSCOPY    . DG BIOPSY LUNG Left 10/2014   FNA - Adenocarcinoma   . ESOPHAGOGASTRODUODENOSCOPY (EGD) WITH PROPOFOL N/A 12/21/2019   Procedure: ESOPHAGOGASTRODUODENOSCOPY (EGD) WITH PROPOFOL;  Surgeon: Rush Landmark Telford Nab., MD;  Location: [facility];  Service: Gastroenterology;  Laterality: N/A;  . EUS N/A 12/21/2019   Procedure: UPPER ENDOSCOPIC ULTRASOUND (EUS) RADIAL;  Surgeon: Rush Landmark Telford Nab., MD;  Location: [facility];  Service: Gastroenterology;  Laterality: N/A;  . FEMUR IM NAIL Left 02/19/2017  . FEMUR IM NAIL Left 02/19/2017   Procedure: INTRAMEDULLARY (IM) NAIL FEMORAL;  Surgeon: Marchia Bond, MD;  Location: [facility];  Service: Orthopedics;  Laterality: Left;  . IR FLUORO GUIDE PORT INSERTION RIGHT  07/03/2017  . IR US GUIDE VASC ACCESS RIGHT  07/03/2017  . LUNG CANCER SURGERY Left 12/2014   Wedge Resection   . MULTIPLE TOOTH EXTRACTIONS    . Status post TURP    . TONSILLECTOMY     Social History   Socioeconomic History  .  Marital status: Married    Spouse name: Not on file  . Number of children: Not on file  . Years of education: Not on file  . Highest education level: Not on file  Occupational History  . Not on file  Tobacco Use  . Smoking status: Former Smoker    Packs/day: 1.00    Years: 50.00    Pack years: 50.00    Types: Cigarettes    Quit date: 08/15/2012    Years since quitting: 7.4  . Smokeless tobacco: Never Used  Vaping Use  . Vaping Use: Never used  Substance and Sexual Activity  . Alcohol use: No  . Drug use: No  . Sexual activity: Not on file  Other Topics Concern  . Not on file  Social History Narrative   [facility] (09/26/16):   Originally from [place]. Moved to [place] [facility] February 2018. Always lived in [place]. Moved to be closer to children & grandchildren. No international travel. 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 No mold exposure. No bird exposure or hot tub exposure. Enjoys reading. Previously enjoyed wood working with domestic woods.    Social Determinants of Health   Financial Resource Strain: Low Risk   . Difficulty of Paying Living Expenses: Not hard at all  Food Insecurity: No Food Insecurity  . Worried About Charity fundraiser in the Last Year: Never true  . Ran Out of Food in the Last Year: Never true  Transportation Needs: No Transportation Needs  . Lack of Transportation (Medical): No  . Lack of Transportation (Non-Medical): No  Physical Activity:   . Days of Exercise per Week: Not on file  . Minutes of Exercise per Session: Not on file  Stress:   . Feeling of Stress : Not on file  Social Connections:   . Frequency of Communication with Friends and Family: Not on file  . Frequency of Social Gatherings with Friends and Family: Not on file  . Attends Religious Services: Not on file  . Active Member of Clubs or Organizations: Not on file  . Attends Archivist Meetings: Not on  file  . Marital Status: Not on file  Intimate Partner Violence:   . Fear of Current or Ex-Partner: Not on file  . Emotionally Abused: Not on file  . Physically Abused: Not on file  . Sexually Abused: Not on file   Family History  Problem Relation Age of Onset  . Breast cancer Mother   . Breast cancer Maternal Aunt   . Breast cancer Maternal Aunt   . Lung disease Neg Hx   . Colon cancer Neg Hx   . Stomach cancer Neg Hx   . Pancreatic cancer Neg Hx   . Rectal cancer Neg Hx    I have reviewed his medical, social, and family history in detail and updated the electronic medical record as necessary.    PHYSICAL EXAMINATION  BP 90/60   Pulse (!) 57   Ht 6\' 1"  (1.854 m)   Wt 218 lb (98.9 kg)   BMI 28.76 kg/m  Wt Readings from Last 3 Encounters:  01/28/20 218 lb (98.9 kg)  01/21/20 221 lb 4.8 oz (100.4 kg)  12/31/19 225 lb 1.6 oz (102.1 kg)  GEN: NAD, appears stated age, doesn't appear chronically ill, accompanied by wife PSYCH: Cooperative, without pressured speech EYE: Conjunctivae pink, sclerae anicteric ENT: MMM CV: Nontachycardic RESP: No audible wheezing GI: NABS, soft, rounded, NT/ND, without rebound or guarding MSK/EXT: No lower extremity edema SKIN: No jaundice NEURO:  Alert & Oriented x 3, no focal deficits   REVIEW OF DATA  I reviewed the following data at the time of this encounter:  GI Procedures and Studies  September 2021 EGD/EUS EGD Impression: - No gross lesions in esophagus proximally. - LA Grade D esophagitis with bleeding in the middle and distal esophagus with some nodularity and some deep ulcerations. Biopsied. - Z-line regular, 40 cm from the incisors. - 4 cm hiatal hernia. - Erythematous mucosa in the antrum. Biopsied. - Nodule found in the duodenum. Biopsied. - Duodenal scar in setting of a healing ulcer.. - Duodenal erosions without bleeding. - Mild inflammation noted on the ampulla. Biopsied. EUS Impression: - Pancreatic parenchymal  abnormalities consisting of hyperechoic foci, lobularity with honeycombing and a cyst were noted in the entire pancreas. This meets EUS criteria Major A + Major B for diagnosis of likely Chronic Pancreatitis - A cystic lesion was seen in  the pancreatic head. The diagnosis is a pancreatic pseudocyst. - The pancreatic duct had a normal endosonographic appearance in the pancreatic head, genu of the pancreas, body of the pancreas and tail of the pancreas. - There was no sign of significant pathology in the common bile duct and in the common hepatic duct. - Many stones were visualized endosonographically in the gallbladder. - A cyst was found in the visualized portion of the liver but no other significant findings noted. - No malignant-appearing lymph nodes were visualized in the celiac region (level 20), perigastric region, peripancreatic region and porta hepatis region.  Laboratory Studies  Reviewed those in epic  Imaging Studies  No relevant studies to review   ASSESSMENT  Mr. Staat is a 69 y.o. male with a pmh significant for asthma/COPD, metastatic lung cancer, atrial fibrillation (on anticoagulation), BPH (status post TURP), GERD, chronic pancreatitis (based on EUS), EPI, idiopathic pancreatitis.  The patient is seen today for evaluation and management of:  1. Esophagitis   2. Exocrine pancreatic insufficiency   3. Idiopathic chronic pancreatitis ([place])   4. History of idiopathic acute pancreatitis    The patient is hemodynamically stable.  Clinically he is doing well also.  No evidence of recurrent pancreatitis.  Patient's endoscopic ultrasound was consistent with chronic pancreatitis as a potential etiology for his exocrine pancreatic insufficiency.  Prior to this episode earlier this year he had never had pancreatitis or pancreas issues.  Is not clear we will ever know the underlying etiology at this time.  Hopefully does not have another episode.  But he understands, as does his wife,  that he could have recurrent bouts of pancreatitis as a result of just chronic pancreatitis.  Drug-induced pancreatitis certainly something to consider although he has been on most of his medications for many years.  We are going to try to increase his protein therapy to a closer weight-based dosing.  We will try to increase him to 4 pills with each meal and 1 to 2 pills with each snack, although we need to see exactly what dosing he got from [place] [facility] as he gets patient assistance and he and his wife will let us know.  He needs a follow-up endoscopy in setting of his significant grade D esophagitis and we will plan to get that scheduled in the coming weeks.  We will hopefully be able to get him down on his PPI therapy thereafter.  If he has any evidence of stricturing we can consider dilation at that time.  The risks and benefits of endoscopic evaluation were discussed with the patient; these include but are not limited to the risk of perforation, infection, bleeding, missed lesions, lack of diagnosis, severe illness requiring hospitalization, as well as anesthesia and sedation related illnesses.  The patient is agreeable to proceed.  All patient questions were answered to the best of my ability, and the patient agrees to the aforementioned plan of action with follow-up as indicated.   PLAN  Continue PERT therapy -We will want to increase to 4 pills with each meal and 1-2 with each snack -We will see what his dosing and total prescription was from [facility] Continue PPI 40 mg twice daily until endoscopy Proceed with scheduling surveillance endoscopy to check on healing of severe esophagitis Patient will be getting periodic imaging scans due to his underlying cancer and so we can use that as a marker for follow-up of his pancreatic cyst   Orders Placed This Encounter  Procedures  . Ambulatory referral to  Gastroenterology    New Prescriptions   No medications on file   Modified Medications   Modified  Medication Previous Medication   PANTOPRAZOLE (PROTONIX) 40 MG TABLET pantoprazole (PROTONIX) 40 MG tablet      Take 1 tablet (40 mg total) by mouth 2 (two) times daily before a meal.    Take 1 tablet (40 mg total) by mouth 2 (two) times daily before a meal.    Planned Follow Up No follow-ups on file.   Total Time in Face-to-Face and in Coordination of Care for patient including independent/personal interpretation/review of prior testing, medical history, examination, medication adjustment, communicating results with the patient directly, and documentation with the EHR is 30 minutes.   Justice Britain, MD [facility]oscopy Office # [phone]

## 2020-02-01 ENCOUNTER — Other Ambulatory Visit: Payer: Self-pay

## 2020-02-01 ENCOUNTER — Inpatient Hospital Stay: Payer: Medicare HMO

## 2020-02-01 ENCOUNTER — Telehealth: Payer: Self-pay | Admitting: Gastroenterology

## 2020-02-01 ENCOUNTER — Encounter: Payer: Self-pay | Admitting: Physician Assistant

## 2020-02-01 ENCOUNTER — Inpatient Hospital Stay: Payer: Medicare HMO | Attending: Internal Medicine | Admitting: Physician Assistant

## 2020-02-01 VITALS — BP 103/83 | HR 97 | Temp 98.7°F | Resp 18 | Ht 73.0 in | Wt 221.8 lb

## 2020-02-01 DIAGNOSIS — Z923 Personal history of irradiation: Secondary | ICD-10-CM | POA: Insufficient documentation

## 2020-02-01 DIAGNOSIS — Z79899 Other long term (current) drug therapy: Secondary | ICD-10-CM | POA: Insufficient documentation

## 2020-02-01 DIAGNOSIS — Z5112 Encounter for antineoplastic immunotherapy: Secondary | ICD-10-CM | POA: Insufficient documentation

## 2020-02-01 DIAGNOSIS — Z9221 Personal history of antineoplastic chemotherapy: Secondary | ICD-10-CM | POA: Insufficient documentation

## 2020-02-01 DIAGNOSIS — C3432 Malignant neoplasm of lower lobe, left bronchus or lung: Secondary | ICD-10-CM | POA: Diagnosis present

## 2020-02-01 DIAGNOSIS — C7951 Secondary malignant neoplasm of bone: Secondary | ICD-10-CM | POA: Insufficient documentation

## 2020-02-01 DIAGNOSIS — C3492 Malignant neoplasm of unspecified part of left bronchus or lung: Secondary | ICD-10-CM | POA: Diagnosis not present

## 2020-02-01 DIAGNOSIS — Z902 Acquired absence of lung [part of]: Secondary | ICD-10-CM | POA: Insufficient documentation

## 2020-02-01 DIAGNOSIS — Z95828 Presence of other vascular implants and grafts: Secondary | ICD-10-CM

## 2020-02-01 LAB — CBC WITH DIFFERENTIAL (CANCER CENTER ONLY)
Abs Immature Granulocytes: 0.04 10*3/uL (ref 0.00–0.07)
Basophils Absolute: 0 10*3/uL (ref 0.0–0.1)
Basophils Relative: 0 %
Eosinophils Absolute: 0.2 10*3/uL (ref 0.0–0.5)
Eosinophils Relative: 2 %
HCT: 38.4 % — ABNORMAL LOW (ref 39.0–52.0)
Hemoglobin: 13.6 g/dL (ref 13.0–17.0)
Immature Granulocytes: 0 %
Lymphocytes Relative: 13 %
Lymphs Abs: 1.5 10*3/uL (ref 0.7–4.0)
MCH: 34.3 pg — ABNORMAL HIGH (ref 26.0–34.0)
MCHC: 35.4 g/dL (ref 30.0–36.0)
MCV: 96.7 fL (ref 80.0–100.0)
Monocytes Absolute: 0.8 10*3/uL (ref 0.1–1.0)
Monocytes Relative: 7 %
Neutro Abs: 8.6 10*3/uL — ABNORMAL HIGH (ref 1.7–7.7)
Neutrophils Relative %: 78 %
Platelet Count: 155 10*3/uL (ref 150–400)
RBC: 3.97 MIL/uL — ABNORMAL LOW (ref 4.22–5.81)
RDW: 13.2 % (ref 11.5–15.5)
WBC Count: 11.1 10*3/uL — ABNORMAL HIGH (ref 4.0–10.5)
nRBC: 0 % (ref 0.0–0.2)

## 2020-02-01 LAB — CMP (CANCER CENTER ONLY)
ALT: 21 U/L (ref 0–44)
AST: 13 U/L — ABNORMAL LOW (ref 15–41)
Albumin: 3.2 g/dL — ABNORMAL LOW (ref 3.5–5.0)
Alkaline Phosphatase: 96 U/L (ref 38–126)
Anion gap: 6 (ref 5–15)
BUN: 13 mg/dL (ref 8–23)
CO2: 24 mmol/L (ref 22–32)
Calcium: 8.6 mg/dL — ABNORMAL LOW (ref 8.9–10.3)
Chloride: 107 mmol/L (ref 98–111)
Creatinine: 0.79 mg/dL (ref 0.61–1.24)
GFR, Estimated: >60 mL/min (ref >60)
Glucose, Bld: 191 mg/dL — ABNORMAL HIGH (ref 70–99)
Potassium: 4.1 mmol/L (ref 3.5–5.1)
Sodium: 137 mmol/L (ref 135–145)
Total Bilirubin: 0.9 mg/dL (ref 0.3–1.2)
Total Protein: 5.8 g/dL — ABNORMAL LOW (ref 6.5–8.1)

## 2020-02-01 LAB — TSH: TSH: 7.004 u[IU]/mL — ABNORMAL HIGH (ref 0.320–4.118)

## 2020-02-01 MED ORDER — SODIUM CHLORIDE 0.9 % IV SOLN
Freq: Once | INTRAVENOUS | Status: AC
  Filled 2020-02-01: qty 250

## 2020-02-01 MED ORDER — SODIUM CHLORIDE 0.9% FLUSH
10.0000 mL | INTRAVENOUS | Status: DC | PRN
  Administered 2020-02-01: 10 mL
  Filled 2020-02-01: qty 10

## 2020-02-01 MED ORDER — SODIUM CHLORIDE 0.9% FLUSH
10.0000 mL | INTRAVENOUS | Status: DC | PRN
  Filled 2020-02-01: qty 10

## 2020-02-01 MED ORDER — HEPARIN SOD (PORK) LOCK FLUSH 100 UNIT/ML IV SOLN
500.0000 [IU] | Freq: Once | INTRAVENOUS | Status: DC | PRN
  Filled 2020-02-01: qty 5

## 2020-02-01 MED ORDER — SODIUM CHLORIDE 0.9 % IV SOLN
200.0000 mg | Freq: Once | INTRAVENOUS | Status: AC
  Administered 2020-02-01: 200 mg via INTRAVENOUS
  Filled 2020-02-01: qty 8

## 2020-02-01 NOTE — Patient Instructions (Signed)
[facility] Discharge Instructions for Patients Receiving Chemotherapy  Today you received the following monoclonal antibody agent Pembrolizumab (KEYTRUDA).  To help prevent nausea and vomiting after your treatment, we encourage you to take your nausea medication as prescribed.   If you develop nausea and vomiting that is not controlled by your nausea medication, call the clinic.   BELOW ARE SYMPTOMS THAT SHOULD BE REPORTED IMMEDIATELY:  *FEVER GREATER THAN 100.5 F  *CHILLS WITH OR WITHOUT FEVER  NAUSEA AND VOMITING THAT IS NOT CONTROLLED WITH YOUR NAUSEA MEDICATION  *UNUSUAL SHORTNESS OF BREATH  *UNUSUAL BRUISING OR BLEEDING  TENDERNESS IN MOUTH AND THROAT WITH OR WITHOUT PRESENCE OF ULCERS  *URINARY PROBLEMS  *BOWEL PROBLEMS  UNUSUAL RASH Items with * indicate a potential emergency and should be followed up as soon as possible.  Feel free to call the clinic should you have any questions or concerns. The clinic phone number is [phone].  Please show the [facility] at check-in to the Emergency Department and triage nurse.

## 2020-02-01 NOTE — Patient Instructions (Signed)

## 2020-02-01 NOTE — Telephone Encounter (Signed)
Per your plan on 01-28-2020 you wanted to verify the PERT therapy dosing of the Creon  Continue PERT therapy -We will want to increase to 4 pills with each meal and 1-2 with each snack -We will see what his dosing and total prescription was from [facility]  Patient has confirmed dose at 2 capsules with meals and 1 with snacks

## 2020-02-02 NOTE — Telephone Encounter (Signed)
Vivien Rota, thank you for the update. Rovonda, can we try to get an updated prescription to [facility] to try and get the medication dosing increased to 4 pills with each meal and 1-2 with each snack since it has not been updated? Please let me know if there is anything else that I can do. Thanks. GM

## 2020-02-04 ENCOUNTER — Telehealth: Payer: Self-pay | Admitting: Physician Assistant

## 2020-02-04 NOTE — Telephone Encounter (Signed)
Scheduled per los. Called and left msg. Mailed printout  °

## 2020-02-05 MED ORDER — PANCRELIPASE (LIP-PROT-AMYL) 36000-114000 UNITS PO CPEP: ORAL_CAPSULE | ORAL | 3 refills | Status: DC

## 2020-02-05 NOTE — Addendum Note (Signed)
Addended byDebbe Mounts on: 02/05/2020 02:30 PM   Modules accepted: Orders

## 2020-02-05 NOTE — Telephone Encounter (Signed)
[name](spouse) has been informed okay to hold Eliquis x2 days prior to procedure.

## 2020-02-05 NOTE — Telephone Encounter (Signed)
Victor Huber( Spouse) has been informed of increase of Creon 4 caps po with meals, 1-2 caps po with snacks ( up to 2 snacks daily.) Also advised that he may experience some constipation with increase dose. Please let us know if this happens. Update RX faxed to [facility].

## 2020-02-10 ENCOUNTER — Inpatient Hospital Stay (HOSPITAL_BASED_OUTPATIENT_CLINIC_OR_DEPARTMENT_OTHER): Payer: Medicare HMO | Admitting: Medical

## 2020-02-10 ENCOUNTER — Other Ambulatory Visit: Payer: Self-pay

## 2020-02-10 ENCOUNTER — Inpatient Hospital Stay: Payer: Medicare HMO

## 2020-02-10 VITALS — BP 100/74 | HR 63 | Temp 99.4°F | Resp 18 | Ht 73.0 in | Wt 216.0 lb

## 2020-02-10 DIAGNOSIS — C3492 Malignant neoplasm of unspecified part of left bronchus or lung: Secondary | ICD-10-CM | POA: Diagnosis not present

## 2020-02-10 DIAGNOSIS — Z5112 Encounter for antineoplastic immunotherapy: Secondary | ICD-10-CM | POA: Diagnosis not present

## 2020-02-10 DIAGNOSIS — M25461 Effusion, right knee: Secondary | ICD-10-CM

## 2020-02-10 DIAGNOSIS — J029 Acute pharyngitis, unspecified: Secondary | ICD-10-CM

## 2020-02-10 DIAGNOSIS — C7951 Secondary malignant neoplasm of bone: Secondary | ICD-10-CM | POA: Diagnosis not present

## 2020-02-10 DIAGNOSIS — J018 Other acute sinusitis: Secondary | ICD-10-CM

## 2020-02-10 DIAGNOSIS — G8929 Other chronic pain: Secondary | ICD-10-CM

## 2020-02-10 LAB — CBC WITH DIFFERENTIAL (CANCER CENTER ONLY)
Abs Immature Granulocytes: 0.04 10*3/uL (ref 0.00–0.07)
Basophils Absolute: 0 10*3/uL (ref 0.0–0.1)
Basophils Relative: 0 %
Eosinophils Absolute: 0.2 10*3/uL (ref 0.0–0.5)
Eosinophils Relative: 1 %
HCT: 39.7 % (ref 39.0–52.0)
Hemoglobin: 14 g/dL (ref 13.0–17.0)
Immature Granulocytes: 0 %
Lymphocytes Relative: 12 %
Lymphs Abs: 1.4 10*3/uL (ref 0.7–4.0)
MCH: 34.1 pg — ABNORMAL HIGH (ref 26.0–34.0)
MCHC: 35.3 g/dL (ref 30.0–36.0)
MCV: 96.8 fL (ref 80.0–100.0)
Monocytes Absolute: 0.7 10*3/uL (ref 0.1–1.0)
Monocytes Relative: 7 %
Neutro Abs: 8.7 10*3/uL — ABNORMAL HIGH (ref 1.7–7.7)
Neutrophils Relative %: 80 %
Platelet Count: 226 10*3/uL (ref 150–400)
RBC: 4.1 MIL/uL — ABNORMAL LOW (ref 4.22–5.81)
RDW: 12.6 % (ref 11.5–15.5)
WBC Count: 11 10*3/uL — ABNORMAL HIGH (ref 4.0–10.5)
nRBC: 0 % (ref 0.0–0.2)

## 2020-02-10 LAB — CMP (CANCER CENTER ONLY)
ALT: 19 U/L (ref 0–44)
AST: 20 U/L (ref 15–41)
Albumin: 3.2 g/dL — ABNORMAL LOW (ref 3.5–5.0)
Alkaline Phosphatase: 123 U/L (ref 38–126)
Anion gap: 10 (ref 5–15)
BUN: 14 mg/dL (ref 8–23)
CO2: 26 mmol/L (ref 22–32)
Calcium: 9.2 mg/dL (ref 8.9–10.3)
Chloride: 101 mmol/L (ref 98–111)
Creatinine: 0.95 mg/dL (ref 0.61–1.24)
GFR, Estimated: >60 mL/min (ref >60)
Glucose, Bld: 146 mg/dL — ABNORMAL HIGH (ref 70–99)
Potassium: 4 mmol/L (ref 3.5–5.1)
Sodium: 137 mmol/L (ref 135–145)
Total Bilirubin: 1.2 mg/dL (ref 0.3–1.2)
Total Protein: 6.9 g/dL (ref 6.5–8.1)

## 2020-02-10 LAB — URIC ACID: Uric Acid, Serum: 3.8 mg/dL (ref 3.7–8.6)

## 2020-02-10 MED ORDER — MAGIC MOUTHWASH: 5.0000 mL | Freq: Four times a day (QID) | ORAL | 2 refills | Status: DC | PRN

## 2020-02-10 MED ORDER — AZITHROMYCIN 250 MG PO TABS: ORAL_TABLET | ORAL | 0 refills | Status: DC

## 2020-02-10 MED ORDER — OXYCODONE HCL 10 MG PO TABS: 10.0000 mg | ORAL_TABLET | Freq: Every day | ORAL | 0 refills | Status: DC | PRN

## 2020-02-11 NOTE — Progress Notes (Signed)
[facility] Progress Note   Victor Huber [id number] 1950/07/31 69 y.o.  Victor Huber is managed by Dr. Fanny Huber. Victor Huber  Actively treated with chemotherapy/immunotherapy/hormonal therapy: yes  Current therapy: Keytruda  Last treated: 02/01/2020 (cycle # 50)  Next scheduled appointment with provider: 02/22/2020  Assessment: Plan:    Adenocarcinoma of left lung, stage 4 ([place]) - Plan: CBC with Differential ([place] Only), CMP ([place] only)  Swollen R knee - Plan: Uric acid  Metastasis to bone ([place]) - Plan: Oxycodone HCl 10 MG TABS  Other acute sinusitis, recurrence not specified - Plan: azithromycin (ZITHROMAX Z-PAK) 250 MG tablet  Sore throat - Plan: magic mouthwash SOLN   Metastatic adenocarcinoma of the left lung with bone metastasis: Victor Huber continues to be managed by Dr. Julien Huber and is status post cycle # 82 of Keytruda. He is scheduled to be seen next on 02/22/2020.  Right knee pain with swelling: A uric acid returned normal at 3.8 today. He was given a refill of oxycodone and was scheduled to see Dr. Fredonia Huber and Victor Huber todat at 2:00 PM.  Sinusitis: He was given a prescription foe a Z-pak today.  Sore throat: He was given a precription for Magic Mouthwash today.  Please see After Visit Summary for patient specific instructions.  Future Appointments  Date Time Provider [place]  02/22/2020  2:00 PM [facility] LAB [facility] None  02/22/2020  2:30 PM Heilingoetter, Cassandra L, PA-C [facility] None  02/22/2020  3:15 PM [facility] INFUSION [facility] None  03/11/2020  4:00 PM [name], Telford Nab., MD [facility]  03/14/2020  1:15 PM [facility] LAB [facility] None  03/14/2020  1:30 PM [facility] [place] FLUSH [facility] None  03/14/2020  2:00 PM Victor Bears, MD [facility] None  03/14/2020  3:00 PM [facility] INFUSION [facility] None  03/18/2020  3:00 PM Victor Latch,  MD [facility]  04/04/2020  1:15 PM [facility] LAB [facility] None  04/04/2020  1:30 PM [facility] [place] FLUSH [facility] None  04/04/2020  2:00 PM Victor Bears, MD [facility] None  04/04/2020  3:00 PM [facility] INFUSION [facility] None    Orders Placed This Encounter  Procedures  . CBC with Differential ([place] Only)  . CMP ([place] only)  . Uric acid       Subjective:   Patient ID:  Victor Huber is a 69 y.o. (DOB 1951-01-01) male.  Chief Complaint: No chief complaint on file.   HPI Victor Huber  is a 69 y.o. male with a diagnosis of a metastatic adenocarcinoma of the left lung with bone metastasis. [name] continues is followed by Dr. Julien Huber and is status post cycle # 62 of Keytruda. He presents today with a painful and swollen right knee which has been getting worse over the past couple of weeks after he was given a prednisone taper for sinusitis. He also reports a sore throat and post nasal drainage with sinus pressure. He denies fevers, chills, or sweats.  Medications: I have reviewed the patient's current medications.  Allergies:  Allergies  Allergen Reactions  . Carboplatin Itching, Nausea And Vomiting and Other (See Comments)    Flushing  . Flecainide Hypertension    CAUSED HEART ISSUES   . Debrox [Carbamide Peroxide]     Swelling in ear canal.     Past Medical History:  Diagnosis Date  . Adenocarcinoma of left lung, stage 3 ([place]) 08/15/2016  . Anxiety   . Arthritis   . Atrial fibrillation ([place])  08/15/2016  . Atrial fibrillation ([place])   . Cancer, metastatic to bone ([place])   . COPD (chronic obstructive pulmonary disease) ([place]) 08/15/2016  . Depression   . Dysphagia   . Dysrhythmia    a-fib  . History of chemotherapy   . History of radiation therapy   . Hyperlipidemia   . Hypothyroid 08/15/2016  . Longstanding persistent atrial fibrillation ([place]) 08/29/2016  . Pathologic fracture    left femur  . Pneumonitis   . S/P TURP  08/15/2016  . Wears glasses   . Wears hearing aid in both ears     Past Surgical History:  Procedure Laterality Date  . BIOPSY  12/21/2019   Procedure: BIOPSY;  Surgeon: Rush Landmark Telford Nab., MD;  Location: [facility];  Service: Gastroenterology;;  . BRONCHOSCOPY  10/2014  . CARDIAC CATHETERIZATION     05/07/12  . CARDIOVERSION     x2  . COLONOSCOPY    . DG BIOPSY LUNG Left 10/2014   FNA - Adenocarcinoma   . ESOPHAGOGASTRODUODENOSCOPY (EGD) WITH PROPOFOL N/A 12/21/2019   Procedure: ESOPHAGOGASTRODUODENOSCOPY (EGD) WITH PROPOFOL;  Surgeon: Rush Landmark Telford Nab., MD;  Location: [facility];  Service: Gastroenterology;  Laterality: N/A;  . EUS N/A 12/21/2019   Procedure: UPPER ENDOSCOPIC ULTRASOUND (EUS) RADIAL;  Surgeon: Rush Landmark Telford Nab., MD;  Location: [facility];  Service: Gastroenterology;  Laterality: N/A;  . FEMUR IM NAIL Left 02/19/2017  . FEMUR IM NAIL Left 02/19/2017   Procedure: INTRAMEDULLARY (IM) NAIL FEMORAL;  Surgeon: Marchia Bond, MD;  Location: [facility];  Service: Orthopedics;  Laterality: Left;  . IR FLUORO GUIDE PORT INSERTION RIGHT  07/03/2017  . IR US GUIDE VASC ACCESS RIGHT  07/03/2017  . LUNG CANCER SURGERY Left 12/2014   Wedge Resection   . MULTIPLE TOOTH EXTRACTIONS    . Status post TURP    . TONSILLECTOMY      Family History  Problem Relation Age of Onset  . Breast cancer Mother   . Breast cancer Maternal Aunt   . Breast cancer Maternal Aunt   . Lung disease Neg Hx   . Colon cancer Neg Hx   . Stomach cancer Neg Hx   . Pancreatic cancer Neg Hx   . Rectal cancer Neg Hx     Social History   Socioeconomic History  . Marital status: Married    Spouse name: Not on file  . Number of children: Not on file  . Years of education: Not on file  . Highest education level: Not on file  Occupational History  . Not on file  Tobacco Use  . Smoking status: Former Smoker    Packs/day: 1.00    Years: 50.00    Pack years: 50.00    Types: Cigarettes     Quit date: 08/15/2012    Years since quitting: 7.4  . Smokeless tobacco: Never Used  Vaping Use  . Vaping Use: Never used  Substance and Sexual Activity  . Alcohol use: No  . Drug use: No  . Sexual activity: Not on file  Other Topics Concern  . Not on file  Social History Narrative   [facility] (09/26/16):   Originally from [place]. Moved to [facility] February 2018. Always lived in [place]. Moved to be closer to children & grandchildren. No international travel. 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 No mold exposure. No bird exposure or hot tub exposure. Enjoys reading. Previously enjoyed wood working with domestic woods.  Social Determinants of Health   Financial Resource Strain: Low Risk   . Difficulty of Paying Living Expenses: Not hard at all  Food Insecurity: No Food Insecurity  . Worried About Charity fundraiser in the Last Year: Never true  . Ran Out of Food in the Last Year: Never true  Transportation Needs: No Transportation Needs  . Lack of Transportation (Medical): No  . Lack of Transportation (Non-Medical): No  Physical Activity:   . Days of Exercise per Week: Not on file  . Minutes of Exercise per Session: Not on file  Stress:   . Feeling of Stress : Not on file  Social Connections:   . Frequency of Communication with Friends and Family: Not on file  . Frequency of Social Gatherings with Friends and Family: Not on file  . Attends Religious Services: Not on file  . Active Member of Clubs or Organizations: Not on file  . Attends Archivist Meetings: Not on file  . Marital Status: Not on file  Intimate Partner Violence:   . Fear of Current or Ex-Partner: Not on file  . Emotionally Abused: Not on file  . Physically Abused: Not on file  . Sexually Abused: Not on file    Past Medical History, Surgical history, Social history, and Family history were reviewed and updated as  appropriate.   Please see review of systems for further details on the patient's review from today.   Review of Systems:  Review of Systems  Constitutional: Negative for chills, diaphoresis, fatigue and fever.  HENT: Positive for congestion, postnasal drip, sinus pressure and sinus pain. Negative for facial swelling, rhinorrhea, sore throat and trouble swallowing.   Respiratory: Negative for cough and shortness of breath.   Cardiovascular: Negative for chest pain and palpitations.  Gastrointestinal: Negative for nausea and vomiting.  Musculoskeletal: Positive for arthralgias, gait problem and joint swelling.  Skin: Negative for color change, rash and wound.  Neurological: Negative for dizziness and headaches.    Objective:   Physical Exam:  BP 100/74 (BP Location: Right Arm, Patient Position: Sitting)   Pulse 63   Temp 99.4 F (37.4 C) (Tympanic)   Resp 18   Ht 6\' 1"  (1.854 m)   Wt 216 lb (98 kg)   SpO2 93%   BMI 28.50 kg/m  ECOG: 1  Physical Exam Constitutional:      General: He is not in acute distress.    Appearance: He is not diaphoretic.  HENT:     Head: Normocephalic and atraumatic.     Nose:     Right Sinus: Frontal sinus tenderness present. No maxillary sinus tenderness.     Left Sinus: Frontal sinus tenderness present. No maxillary sinus tenderness.     Mouth/Throat:     Pharynx: Posterior oropharyngeal erythema present. No oropharyngeal exudate.     Comments: Post nasal drainage noted Eyes:     General: No scleral icterus.       Right eye: No discharge.        Left eye: No discharge.     Conjunctiva/sclera: Conjunctivae normal.  Cardiovascular:     Rate and Rhythm: Normal rate and regular rhythm.     Heart sounds: Normal heart sounds. No murmur heard.  No friction rub. No gallop.   Pulmonary:     Effort: Pulmonary effort is normal. No respiratory distress.     Breath sounds: Normal breath sounds. No wheezing or rales.  Musculoskeletal:        General:  Swelling and tenderness present.       Legs:  Skin:    General: Skin is warm and dry.     Findings: No erythema or rash.  Neurological:     Mental Status: He is alert.     Lab Review:     Component Value Date/Time   NA 137 02/10/2020 1106   NA 136 04/04/2017 1141   K 4.0 02/10/2020 1106   K 4.4 04/04/2017 1141   CL 101 02/10/2020 1106   CO2 26 02/10/2020 1106   CO2 24 04/04/2017 1141   GLUCOSE 146 (H) 02/10/2020 1106   GLUCOSE 116 04/04/2017 1141   BUN 14 02/10/2020 1106   BUN 21.1 04/04/2017 1141   CREATININE 0.95 02/10/2020 1106   CREATININE 1.1 04/04/2017 1141   CALCIUM 9.2 02/10/2020 1106   CALCIUM 9.1 04/04/2017 1141   PROT 6.9 02/10/2020 1106   PROT 6.2 09/28/2019 1525   PROT 6.6 04/04/2017 1141   ALBUMIN 3.2 (Huber) 02/10/2020 1106   ALBUMIN 3.5 04/04/2017 1141   AST 20 02/10/2020 1106   AST 17 04/04/2017 1141   ALT 19 02/10/2020 1106   ALT 18 04/04/2017 1141   ALKPHOS 123 02/10/2020 1106   ALKPHOS 89 04/04/2017 1141   BILITOT 1.2 02/10/2020 1106   BILITOT 0.67 04/04/2017 1141   GFRNONAA >60 02/10/2020 1106   GFRAA >60 12/31/2019 1130       Component Value Date/Time   WBC 11.0 (H) 02/10/2020 1106   WBC 7.6 09/10/2019 1640   RBC 4.10 (Huber) 02/10/2020 1106   HGB 14.0 02/10/2020 1106   HGB 14.2 04/04/2017 1141   HCT 39.7 02/10/2020 1106   HCT 41.3 04/04/2017 1141   PLT 226 02/10/2020 1106   PLT 119 (Huber) 04/04/2017 1141   MCV 96.8 02/10/2020 1106   MCV 94.5 04/04/2017 1141   MCH 34.1 (H) 02/10/2020 1106   MCHC 35.3 02/10/2020 1106   RDW 12.6 02/10/2020 1106   RDW 14.6 04/04/2017 1141   LYMPHSABS 1.4 02/10/2020 1106   LYMPHSABS 0.9 04/04/2017 1141   MONOABS 0.7 02/10/2020 1106   MONOABS 0.3 04/04/2017 1141   EOSABS 0.2 02/10/2020 1106   EOSABS 0.0 04/04/2017 1141   BASOSABS 0.0 02/10/2020 1106   BASOSABS 0.0 04/04/2017 1141   -------------------------------  Imaging from last 24 hours (if applicable):  Radiology interpretation: CT Chest W  Contrast  Result Date: 01/29/2020 CLINICAL DATA:  Non-small cell lung cancer staging EXAM: CT CHEST, ABDOMEN, AND PELVIS WITH CONTRAST TECHNIQUE: Multidetector CT imaging of the chest, abdomen and pelvis was performed following the standard protocol during bolus administration of intravenous contrast. CONTRAST:  149mL OMNIPAQUE IOHEXOL 300 MG/ML  SOLN COMPARISON:  October 07, 2019 FINDINGS: CT CHEST FINDINGS Cardiovascular: RIGHT-sided Port-A-Cath terminates at the caval to atrial junction as before. Heart size remains enlarged. Small pericardial effusion similar to the prior exam. Aortic atheromatous changes, mild and unchanged. Signs of coronary artery calcifications similar to the prior study. Central pulmonary vasculature unchanged and otherwise unremarkable on venous phase assessment aside from LEFT hilar soft tissue postoperative changes in the LEFT chest, see below. Mediastinum/Nodes: No thoracic inlet, axillary or new mediastinal adenopathy. The mild enlargement of subcarinal nodal tissue is diminished in size 11 mm short axis (image 33, series 2) previously 17 mm short axis. RIGHT paratracheal lymph node measuring 9 mm (image 19, series 2) previously approximately 11 mm. Mild esophageal thickening is unchanged. Lungs/Pleura: Marked pulmonary emphysema and mild moderate septal thickening scattered throughout the chest. Post treatment  changes about the LEFT hilum these are unchanged. The small nodule in the LEFT upper chest seen on the previous study has resolved. The sub solid nodule along the major fissure in the RIGHT chest has resolved in the interval as well. No new suspicious pulmonary nodule. Musculoskeletal: No chest wall mass. See below for full musculoskeletal detail. CT ABDOMEN PELVIS FINDINGS Hepatobiliary: No focal, suspicious hepatic lesion. Portal vein is patent. Cholelithiasis layering in the dependent aspect of the gallbladder similar to prior imaging. Pancreas: Mild pancreatic atrophy since  previous imaging. No ductal dilation. No current signs of inflammation. Spleen: Spleen is normal size without focal lesion. Adrenals/Urinary Tract: Adrenal glands are normal. Renal sinus cysts on the LEFT and benign cortical cyst along the anterior LEFT kidney. Urinary bladder under distended but otherwise unremarkable. Stomach/Bowel: No acute gastrointestinal process. Stool throughout the colon. Colonic diverticulosis. Vascular/Lymphatic: Calcified and noncalcified atheromatous plaque in the abdominal aorta. Insert of abdominal lymphadenopathy. No signs of aortic dilation. No pelvic sidewall lymphadenopathy. Reproductive: Prostatomegaly similar to prior imaging. Other: Small fat containing bilateral inguinal hernias. Musculoskeletal: No acute bone finding. No destructive bone process. Post LEFT thoracotomy with changes in the ribs related to prior surgery. Post LEFT femoral ORIF. IMPRESSION: 1. Post treatment changes about the LEFT hilum are unchanged. No signs of new or suspicious findings in the chest. 2. Interval resolution of small nodules in the LEFT upper chest and along the RIGHT major fissure. 3. Decrease in size of RIGHT hilar and mediastinal lymph nodes, attention on follow-up. 4. No evidence of metastatic disease in the abdomen or pelvis. 5. Stable small pericardial effusion. 6. Cholelithiasis. 7. Colonic diverticulosis without evidence of acute diverticulitis. 8. Emphysema and aortic atherosclerosis. Aortic Atherosclerosis (ICD10-I70.0) and Emphysema (ICD10-J43.9). Electronically Signed   By: Zetta Bills M.D.   On: 01/29/2020 17:29   CT Abdomen Pelvis W Contrast  Result Date: 01/29/2020 CLINICAL DATA:  Non-small cell lung cancer staging EXAM: CT CHEST, ABDOMEN, AND PELVIS WITH CONTRAST TECHNIQUE: Multidetector CT imaging of the chest, abdomen and pelvis was performed following the standard protocol during bolus administration of intravenous contrast. CONTRAST:  178mL OMNIPAQUE IOHEXOL 300 MG/ML   SOLN COMPARISON:  October 07, 2019 FINDINGS: CT CHEST FINDINGS Cardiovascular: RIGHT-sided Port-A-Cath terminates at the caval to atrial junction as before. Heart size remains enlarged. Small pericardial effusion similar to the prior exam. Aortic atheromatous changes, mild and unchanged. Signs of coronary artery calcifications similar to the prior study. Central pulmonary vasculature unchanged and otherwise unremarkable on venous phase assessment aside from LEFT hilar soft tissue postoperative changes in the LEFT chest, see below. Mediastinum/Nodes: No thoracic inlet, axillary or new mediastinal adenopathy. The mild enlargement of subcarinal nodal tissue is diminished in size 11 mm short axis (image 33, series 2) previously 17 mm short axis. RIGHT paratracheal lymph node measuring 9 mm (image 19, series 2) previously approximately 11 mm. Mild esophageal thickening is unchanged. Lungs/Pleura: Marked pulmonary emphysema and mild moderate septal thickening scattered throughout the chest. Post treatment changes about the LEFT hilum these are unchanged. The small nodule in the LEFT upper chest seen on the previous study has resolved. The sub solid nodule along the major fissure in the RIGHT chest has resolved in the interval as well. No new suspicious pulmonary nodule. Musculoskeletal: No chest wall mass. See below for full musculoskeletal detail. CT ABDOMEN PELVIS FINDINGS Hepatobiliary: No focal, suspicious hepatic lesion. Portal vein is patent. Cholelithiasis layering in the dependent aspect of the gallbladder similar to prior imaging. Pancreas: Mild  pancreatic atrophy since previous imaging. No ductal dilation. No current signs of inflammation. Spleen: Spleen is normal size without focal lesion. Adrenals/Urinary Tract: Adrenal glands are normal. Renal sinus cysts on the LEFT and benign cortical cyst along the anterior LEFT kidney. Urinary bladder under distended but otherwise unremarkable. Stomach/Bowel: No acute  gastrointestinal process. Stool throughout the colon. Colonic diverticulosis. Vascular/Lymphatic: Calcified and noncalcified atheromatous plaque in the abdominal aorta. Insert of abdominal lymphadenopathy. No signs of aortic dilation. No pelvic sidewall lymphadenopathy. Reproductive: Prostatomegaly similar to prior imaging. Other: Small fat containing bilateral inguinal hernias. Musculoskeletal: No acute bone finding. No destructive bone process. Post LEFT thoracotomy with changes in the ribs related to prior surgery. Post LEFT femoral ORIF. IMPRESSION: 1. Post treatment changes about the LEFT hilum are unchanged. No signs of new or suspicious findings in the chest. 2. Interval resolution of small nodules in the LEFT upper chest and along the RIGHT major fissure. 3. Decrease in size of RIGHT hilar and mediastinal lymph nodes, attention on follow-up. 4. No evidence of metastatic disease in the abdomen or pelvis. 5. Stable small pericardial effusion. 6. Cholelithiasis. 7. Colonic diverticulosis without evidence of acute diverticulitis. 8. Emphysema and aortic atherosclerosis. Aortic Atherosclerosis (ICD10-I70.0) and Emphysema (ICD10-J43.9). Electronically Signed   By: Zetta Bills M.D.   On: 01/29/2020 17:29        This case was discussed with Dr. Julien Huber. He expressed agreement with my management of this patient.

## 2020-02-20 NOTE — Progress Notes (Signed)
[facility] OFFICE PROGRESS NOTE  Margot Ables, MD [address] [place] [address]  DIAGNOSIS: Metastatic non-small cell lung cancer initially diagnosed as stage IIIA (T2a, N2, M0) non-small cell lung cancer, poorly differentiated adenocarcinoma presented with left lower lobe lung mass in addition to mediastinal lymphadenopathy. The patient was diagnosed with metastatic disease involving the left femur as well as left supraclavicular nodal metastases and right paratracheal lymphadenopathy in October 2018.  Biomarker Findings Microsatellite Status - MS-Stable Tumor Mutational Burden - TMB-Low (3 Muts/Mb) Genomic Findings For a complete list of the genes assayed, please refer to the Appendix. STK11 P271fs*6 CDKN2A p14ARF splice site 193+1G>T DAXX E374* MLL2 V4576fs*40 NBN K256fs*5 NOTCH2 R1410H PMS2 splice site 988+1G>A 8 Disease relevant genes with no reportable alterations: EGFR, KRAS, ALK, BRAF, MET, RET, ERBB2, ROS1   PDL1 expression5%  PRIOR THERAPY: 1) status post wedge resection of the left lower lobe lung mass as well as AP window lymph node dissection but there was residual metastatic mediastinal lymphadenopathy that could not be resected. 2) a course of concurrent chemoradiation with weekly carboplatin and paclitaxel in [place] completed 03/01/2015.  3) status post palliative radiotherapy to the left femur metastatic bone disease. 4) Systemic chemotherapy with carboplatin for AUC of 5, Alimta 500 mg/M2 and Keytruda 200 mg IV every 3 weeks. First dose March 07, 2017. Carboplatin was discontinued during cycle #2 secondary to hypersensitivity reaction. 5) status post 2 cycles of maintenance treatment with Alimta and Ketruda (pembrolizumab). Alimta was discontinued secondary to intolerance.  CURRENT THERAPY: Maintenance treatment with single agent Ketruda (pembrolizumab) status post 50cycles.  INTERVAL HISTORY: Victor Huber  69 y.o. male returns to the clinic today for a follow up visit accompanied by his wife. The patient is feeling fair today. He was recently seen in the [facility] for right knee pain. Of note, the patient's prior metastatic disease to the femur was in the left femur. He was seen at orthopedics who aspirated the fluid. He also received a steroid injection in the knee. He also has been taking ibuprofen. He notes his pain improved somewhat but started causing discomfort again. He also was treated with azithromycin for sinusitis. Today he his sinusitis is resolved at this time. The azithromycin did exacerbate his baseline diarrhea though. He has baseline diarrhea since having pancreatitis several months ago. He takes creon as prescribed by gastroenterology. He is scheduled for a repeat EGD on 03/11/20. He reports he has been having RUQ pain after he eats that subsides without intervention. He does have a history of cholelithiasis. He had a restaging CT scan on 01/29/20 which did not show any inflammation of the pancreas.  He also reports increased gas.   He denies any fevers or night sweats. He has been losing a lot of weight over the last few months. He has lost 11 lbs since 02/02/20. He reports his baseline dyspnea on exertion and dry cough. He denies any chest pain or hemoptysis. He is followed by pulmonology for his COPD. He denies any headaches or visual changes. He denies any rashes or skin changes. He denies any nausea, vomiting, or constipation.  He is here today for evaluation before starting cycle #51.  MEDICAL HISTORY: Past Medical History:  Diagnosis Date  . Adenocarcinoma of left lung, stage 3 (HCC) 08/15/2016  . Anxiety   . Arthritis   . Atrial fibrillation (HCC) 08/15/2016  . Atrial fibrillation (HCC)   . Cancer, metastatic to bone (HCC)   . COPD (chronic obstructive pulmonary  disease) ([place]) 08/15/2016  . Depression   . Dysphagia   . Dysrhythmia    a-fib  . History of chemotherapy   . History of  radiation therapy   . Hyperlipidemia   . Hypothyroid 08/15/2016  . Longstanding persistent atrial fibrillation ([place]) 08/29/2016  . Pathologic fracture    left femur  . Pneumonitis   . S/P TURP 08/15/2016  . Wears glasses   . Wears hearing aid in both ears     ALLERGIES:  is allergic to carboplatin, flecainide, and debrox [carbamide peroxide].  MEDICATIONS:  Current Outpatient Medications  Medication Sig Dispense Refill  . albuterol (VENTOLIN HFA) 108 (90 Base) MCG/ACT inhaler Inhale 1-2 puffs into the lungs every 4 (four) hours as needed for wheezing or shortness of breath. 3 month supply 24 g 3  . atenolol (TENORMIN) 50 MG tablet TAKE 1 TABLET (50 MG TOTAL) BY MOUTH DAILY. 90 tablet 3  . atorvastatin (LIPITOR) 40 MG tablet Take 1 tablet (40 mg total) by mouth daily. 90 tablet 2  . ELIQUIS 5 MG TABS tablet TAKE 1 TABLET TWICE DAILY (Patient taking differently: Take 5 mg by mouth 2 (two) times daily. ) 180 tablet 1  . ibuprofen (ADVIL,MOTRIN) 400 MG tablet Take 400 mg by mouth every 4 (four) hours as needed for moderate pain.    Marland Kitchen levothyroxine (SYNTHROID) 150 MCG tablet Take 150 mcg by mouth daily before breakfast. Take with 25 mcg to equal 175 mcg daily    . lipase/protease/amylase (CREON) 36000 UNITS CPEP capsule Take 4 capsules po during each meal. Take 1-2 capsules po during each snack.( up to 2 snacks daily). 480 capsule 3  . LOTEMAX SM 0.38 % GEL     . magic mouthwash SOLN Take 5 mLs by mouth 4 (four) times daily as needed for mouth pain. 240 mL 2  . Oxycodone HCl 10 MG TABS Take 1 tablet (10 mg total) by mouth daily as needed (pain). 30 tablet 0  . pantoprazole (PROTONIX) 40 MG tablet Take 1 tablet (40 mg total) by mouth 2 (two) times daily before a meal. 60 tablet 4  . tamsulosin (FLOMAX) 0.4 MG CAPS capsule Take 1 capsule (0.4 mg total) by mouth daily. 90 capsule 4  . venlafaxine (EFFEXOR) 37.5 MG tablet      No current facility-administered medications for this visit.    Facility-Administered Medications Ordered in Other Visits  Medication Dose Route Frequency Provider Last Rate Last Admin  . heparin lock flush 100 unit/mL  500 Units Intracatheter Once PRN Curt Bears, MD      . pembrolizumab [facility]) 200 mg in sodium chloride 0.9 % 50 mL chemo infusion  200 mg Intravenous Once Curt Bears, MD      . sodium chloride flush (NS) 0.9 % injection 10 mL  10 mL Intracatheter PRN Curt Bears, MD        SURGICAL HISTORY:  Past Surgical History:  Procedure Laterality Date  . BIOPSY  12/21/2019   Procedure: BIOPSY;  Surgeon: Rush Landmark Telford Nab., MD;  Location: [place];  Service: Gastroenterology;;  . BRONCHOSCOPY  10/2014  . CARDIAC CATHETERIZATION     05/07/12  . CARDIOVERSION     x2  . COLONOSCOPY    . DG BIOPSY LUNG Left 10/2014   FNA - Adenocarcinoma   . ESOPHAGOGASTRODUODENOSCOPY (EGD) WITH PROPOFOL N/A 12/21/2019   Procedure: ESOPHAGOGASTRODUODENOSCOPY (EGD) WITH PROPOFOL;  Surgeon: Rush Landmark Telford Nab., MD;  Location: [place];  Service: Gastroenterology;  Laterality: N/A;  . EUS  N/A 12/21/2019   Procedure: UPPER ENDOSCOPIC ULTRASOUND (EUS) RADIAL;  Surgeon: Irving Copas., MD;  Location: [place];  Service: Gastroenterology;  Laterality: N/A;  . FEMUR IM NAIL Left 02/19/2017  . FEMUR IM NAIL Left 02/19/2017   Procedure: INTRAMEDULLARY (IM) NAIL FEMORAL;  Surgeon: Marchia Bond, MD;  Location: [place];  Service: Orthopedics;  Laterality: Left;  . IR FLUORO GUIDE PORT INSERTION RIGHT  07/03/2017  . IR US GUIDE VASC ACCESS RIGHT  07/03/2017  . LUNG CANCER SURGERY Left 12/2014   Wedge Resection   . MULTIPLE TOOTH EXTRACTIONS    . Status post TURP    . TONSILLECTOMY      REVIEW OF SYSTEMS:   Review of Systems  Constitutional: Positive for fatigue, decreased appetite, and weight loss. Positive for chills. Negative for fever. HENT: Negative for mouth sores, nosebleeds, sore throat and trouble swallowing.   Eyes:  Negative for eye problems and icterus.  Respiratory: Positive for baseline dyspnea on exertion. Negative for cough, hemoptysis, and wheezing.   Cardiovascular: Negative for chest pain and leg swelling.  Gastrointestinal: Positive for occasional RUQ pain after eating. Positive for chronic diarrhea. Negative for constipation, nausea and vomiting.  Genitourinary: Negative for bladder incontinence, difficulty urinating, dysuria, frequency and hematuria.   Musculoskeletal: Positive for right knee pain. Negative for back pain, gait problem, neck pain and neck stiffness.  Skin: Negative for itching and rash.  Neurological: Negative for dizziness, extremity weakness, gait problem, headaches, light-headedness and seizures.  Hematological: Negative for adenopathy. Does not bruise/bleed easily.  Psychiatric/Behavioral: Negative for confusion, depression and sleep disturbance. The patient is not nervous/anxious.     PHYSICAL EXAMINATION:  Blood pressure 115/72, pulse 78, temperature 100.1 F (37.8 C), temperature source Tympanic, resp. rate 14, height _0  (1.854 m), weight 210 lb 3.2 oz (95.3 kg), SpO2 98 %.  ECOG PERFORMANCE STATUS: 1 - Symptomatic but completely ambulatory  Physical Exam  Constitutional: Oriented to person, place, and time and well-developed, well-nourished, and in no distress.  HENT:  Head: Normocephalic and atraumatic.  Mouth/Throat: Oropharynx is clear and moist. No oropharyngeal exudate.  Eyes: Right eye exhibits no discharge. Left eye exhibits no discharge. No scleral icterus.  Neck: Normal range of motion. Neck supple.  Cardiovascular: Normal rate, irregular rhythm, normal heart sounds and intact distal pulses.   Pulmonary/Chest: Effort normal and breath sounds normal. No respiratory distress. No wheezes. No rales.  Abdominal: Soft. Bowel sounds are normal. Exhibits no distension and no mass. There is no tenderness. Negative murphy's sign. No rebound tenderness.   Musculoskeletal: Normal range of motion. Exhibits no edema. Right knee without erythema or tenderness. Right knee with some increased swelling.  Lymphadenopathy:    No cervical adenopathy.  Neurological: Alert and oriented to person, place, and time. Exhibits normal muscle tone. Gait normal. Coordination normal.  Skin: Skin is warm and dry. No rash noted. Not diaphoretic. No erythema. No pallor.  Psychiatric: Mood, memory and judgment normal.  Vitals reviewed.  LABORATORY DATA: Lab Results  Component Value Date   WBC 10.5 02/22/2020   HGB 12.6 (L) 02/22/2020   HCT 36.5 (L) 02/22/2020   MCV 96.3 02/22/2020   PLT 251 02/22/2020      Chemistry      Component Value Date/Time   NA 137 02/22/2020 1418   NA 136 04/04/2017 1141   K 4.1 02/22/2020 1418   K 4.4 04/04/2017 1141   CL 106 02/22/2020 1418   CO2 24 02/22/2020 1418   CO2 24  04/04/2017 1141   BUN 12 02/22/2020 1418   BUN 21.1 04/04/2017 1141   CREATININE 0.75 02/22/2020 1418   CREATININE 1.1 04/04/2017 1141      Component Value Date/Time   CALCIUM 8.5 (L) 02/22/2020 1418   CALCIUM 9.1 04/04/2017 1141   ALKPHOS 112 02/22/2020 1418   ALKPHOS 89 04/04/2017 1141   AST 19 02/22/2020 1418   AST 17 04/04/2017 1141   ALT 20 02/22/2020 1418   ALT 18 04/04/2017 1141   BILITOT 0.6 02/22/2020 1418   BILITOT 0.67 04/04/2017 1141       RADIOGRAPHIC STUDIES:  CT Chest W Contrast  Result Date: 01/29/2020 CLINICAL DATA:  Non-small cell lung cancer staging EXAM: CT CHEST, ABDOMEN, AND PELVIS WITH CONTRAST TECHNIQUE: Multidetector CT imaging of the chest, abdomen and pelvis was performed following the standard protocol during bolus administration of intravenous contrast. CONTRAST:  131m OMNIPAQUE IOHEXOL 300 MG/ML  SOLN COMPARISON:  October 07, 2019 FINDINGS: CT CHEST FINDINGS Cardiovascular: RIGHT-sided Port-A-Cath terminates at the caval to atrial junction as before. Heart size remains enlarged. Small pericardial effusion similar  to the prior exam. Aortic atheromatous changes, mild and unchanged. Signs of coronary artery calcifications similar to the prior study. Central pulmonary vasculature unchanged and otherwise unremarkable on venous phase assessment aside from LEFT hilar soft tissue postoperative changes in the LEFT chest, see below. Mediastinum/Nodes: No thoracic inlet, axillary or new mediastinal adenopathy. The mild enlargement of subcarinal nodal tissue is diminished in size 11 mm short axis (image 33, series 2) previously 17 mm short axis. RIGHT paratracheal lymph node measuring 9 mm (image 19, series 2) previously approximately 11 mm. Mild esophageal thickening is unchanged. Lungs/Pleura: Marked pulmonary emphysema and mild moderate septal thickening scattered throughout the chest. Post treatment changes about the LEFT hilum these are unchanged. The small nodule in the LEFT upper chest seen on the previous study has resolved. The sub solid nodule along the major fissure in the RIGHT chest has resolved in the interval as well. No new suspicious pulmonary nodule. Musculoskeletal: No chest wall mass. See below for full musculoskeletal detail. CT ABDOMEN PELVIS FINDINGS Hepatobiliary: No focal, suspicious hepatic lesion. Portal vein is patent. Cholelithiasis layering in the dependent aspect of the gallbladder similar to prior imaging. Pancreas: Mild pancreatic atrophy since previous imaging. No ductal dilation. No current signs of inflammation. Spleen: Spleen is normal size without focal lesion. Adrenals/Urinary Tract: Adrenal glands are normal. Renal sinus cysts on the LEFT and benign cortical cyst along the anterior LEFT kidney. Urinary bladder under distended but otherwise unremarkable. Stomach/Bowel: No acute gastrointestinal process. Stool throughout the colon. Colonic diverticulosis. Vascular/Lymphatic: Calcified and noncalcified atheromatous plaque in the abdominal aorta. Insert of abdominal lymphadenopathy. No signs of aortic  dilation. No pelvic sidewall lymphadenopathy. Reproductive: Prostatomegaly similar to prior imaging. Other: Small fat containing bilateral inguinal hernias. Musculoskeletal: No acute bone finding. No destructive bone process. Post LEFT thoracotomy with changes in the ribs related to prior surgery. Post LEFT femoral ORIF. IMPRESSION: 1. Post treatment changes about the LEFT hilum are unchanged. No signs of new or suspicious findings in the chest. 2. Interval resolution of small nodules in the LEFT upper chest and along the RIGHT major fissure. 3. Decrease in size of RIGHT hilar and mediastinal lymph nodes, attention on follow-up. 4. No evidence of metastatic disease in the abdomen or pelvis. 5. Stable small pericardial effusion. 6. Cholelithiasis. 7. Colonic diverticulosis without evidence of acute diverticulitis. 8. Emphysema and aortic atherosclerosis. Aortic Atherosclerosis (ICD10-I70.0) and Emphysema (ICD10-J43.9).  Electronically Signed   By: Zetta Bills M.D.   On: 01/29/2020 17:29   CT Abdomen Pelvis W Contrast  Result Date: 01/29/2020 CLINICAL DATA:  Non-small cell lung cancer staging EXAM: CT CHEST, ABDOMEN, AND PELVIS WITH CONTRAST TECHNIQUE: Multidetector CT imaging of the chest, abdomen and pelvis was performed following the standard protocol during bolus administration of intravenous contrast. CONTRAST:  134mL OMNIPAQUE IOHEXOL 300 MG/ML  SOLN COMPARISON:  October 07, 2019 FINDINGS: CT CHEST FINDINGS Cardiovascular: RIGHT-sided Port-A-Cath terminates at the caval to atrial junction as before. Heart size remains enlarged. Small pericardial effusion similar to the prior exam. Aortic atheromatous changes, mild and unchanged. Signs of coronary artery calcifications similar to the prior study. Central pulmonary vasculature unchanged and otherwise unremarkable on venous phase assessment aside from LEFT hilar soft tissue postoperative changes in the LEFT chest, see below. Mediastinum/Nodes: No thoracic inlet,  axillary or new mediastinal adenopathy. The mild enlargement of subcarinal nodal tissue is diminished in size 11 mm short axis (image 33, series 2) previously 17 mm short axis. RIGHT paratracheal lymph node measuring 9 mm (image 19, series 2) previously approximately 11 mm. Mild esophageal thickening is unchanged. Lungs/Pleura: Marked pulmonary emphysema and mild moderate septal thickening scattered throughout the chest. Post treatment changes about the LEFT hilum these are unchanged. The small nodule in the LEFT upper chest seen on the previous study has resolved. The sub solid nodule along the major fissure in the RIGHT chest has resolved in the interval as well. No new suspicious pulmonary nodule. Musculoskeletal: No chest wall mass. See below for full musculoskeletal detail. CT ABDOMEN PELVIS FINDINGS Hepatobiliary: No focal, suspicious hepatic lesion. Portal vein is patent. Cholelithiasis layering in the dependent aspect of the gallbladder similar to prior imaging. Pancreas: Mild pancreatic atrophy since previous imaging. No ductal dilation. No current signs of inflammation. Spleen: Spleen is normal size without focal lesion. Adrenals/Urinary Tract: Adrenal glands are normal. Renal sinus cysts on the LEFT and benign cortical cyst along the anterior LEFT kidney. Urinary bladder under distended but otherwise unremarkable. Stomach/Bowel: No acute gastrointestinal process. Stool throughout the colon. Colonic diverticulosis. Vascular/Lymphatic: Calcified and noncalcified atheromatous plaque in the abdominal aorta. Insert of abdominal lymphadenopathy. No signs of aortic dilation. No pelvic sidewall lymphadenopathy. Reproductive: Prostatomegaly similar to prior imaging. Other: Small fat containing bilateral inguinal hernias. Musculoskeletal: No acute bone finding. No destructive bone process. Post LEFT thoracotomy with changes in the ribs related to prior surgery. Post LEFT femoral ORIF. IMPRESSION: 1. Post treatment  changes about the LEFT hilum are unchanged. No signs of new or suspicious findings in the chest. 2. Interval resolution of small nodules in the LEFT upper chest and along the RIGHT major fissure. 3. Decrease in size of RIGHT hilar and mediastinal lymph nodes, attention on follow-up. 4. No evidence of metastatic disease in the abdomen or pelvis. 5. Stable small pericardial effusion. 6. Cholelithiasis. 7. Colonic diverticulosis without evidence of acute diverticulitis. 8. Emphysema and aortic atherosclerosis. Aortic Atherosclerosis (ICD10-I70.0) and Emphysema (ICD10-J43.9). Electronically Signed   By: Zetta Bills M.D.   On: 01/29/2020 17:29     ASSESSMENT/PLAN:  This is a very pleasant 69 year old Caucasian male with metastatic non-small cell lung cancer, adenocarcinoma. He was initially diagnosed as astage IIIa. He presented with a left lower lobe lung mass in addition to mediastinal lymphadenopathy. He was diagnosed in January 2016. He is status post left lower lobectomy with lymph node dissection followed by concurrent chemoradiation. His PDL 1 expression is 5%.  He was diagnosed  with metastatic disease involving the leftfemuras well as a left supraclavicular nodalmetastasis and right paratracheal lymphadenopathy in October 2018  Hewasstarted on systemic chemotherapy with carboplatin, Alimta, and Keytruda. Carboplatin was discontinued after cycle #2 due to a hypersensitivity reaction. Alimta was also discontinued after cycle #4 due to intolerance. The patient has been on maintenance treatment with single agent Keytruda for50cycles.  Reviewed his symptoms with Dr. Julien Nordmann. Labs were reviewed. Recommend that he proceed with cycle #51 today as scheduled.   Regarding his knee pain, offered x-ray of his knee. The patient reportedly had this performed by orthopedics a few days ago which was unremarkable. He also reportedly had the fluid in his right knee aspirated which did not show a  clear cause of his knee pain. Discussed if no improvement in his knee pain, we can consider a CT of the knee. The patient has a history of metastatic disease to the LEFT femur.   Regarding his RUQ pain, he does not have any pain or tenderness at this time. He has pain typically after eating. The patient has evidence of cholelithiasis on imaging studies which I discussed with him could be the cause of his pain. However, the patient does have a history of pancreatitis. No leukocytosis on labs today. LFTs were unremarkable so low suspicion for pancreatitis but we will order lipase and amylase today. If new or worsening symptoms, can consider ultrasound of the abdomen. He is scheduled to follow up with GI for endoscopy on 03/11/20. Discussed concerning symptoms that would warrant emergency room evaluation including fevers, persistent pain, chills, nausea/vomiting, anorexia.   He has been losing weight recently, likely secondary to chronic diarrhea and decreased appetite. The patient is not a good candidate for steroids due to immunotherapy. He declined a referral to the nutritionist at this time.   We will see him back for a follow up visit in 3 weeks for evaluation before starting cycle #52.   The patient was advised to call immediately if he has any concerning symptoms in the interval. The patient voices understanding of current disease status and treatment options and is in agreement with the current care plan. All questions were answered. The patient knows to call the clinic with any problems, questions or concerns. We can certainly see the patient much sooner if necessary     Orders Placed This Encounter  Procedures  . Lipase, blood    Standing Status:   Future    Number of Occurrences:   1    Standing Expiration Date:   02/21/2021  . Amylase    Standing Status:   Future    Number of Occurrences:   1    Standing Expiration Date:   02/21/2021     Haidy Kackley L Shariq Puig,  PA-C 02/22/20

## 2020-02-22 ENCOUNTER — Inpatient Hospital Stay: Payer: Medicare HMO

## 2020-02-22 ENCOUNTER — Other Ambulatory Visit: Payer: Self-pay

## 2020-02-22 ENCOUNTER — Encounter: Payer: Self-pay | Admitting: Physician Assistant

## 2020-02-22 ENCOUNTER — Inpatient Hospital Stay (HOSPITAL_BASED_OUTPATIENT_CLINIC_OR_DEPARTMENT_OTHER): Payer: Medicare HMO | Admitting: Physician Assistant

## 2020-02-22 VITALS — BP 113/70

## 2020-02-22 VITALS — BP 115/72 | HR 78 | Temp 100.1°F | Resp 14 | Ht 73.0 in | Wt 210.2 lb

## 2020-02-22 DIAGNOSIS — C3492 Malignant neoplasm of unspecified part of left bronchus or lung: Secondary | ICD-10-CM

## 2020-02-22 DIAGNOSIS — R1011 Right upper quadrant pain: Secondary | ICD-10-CM

## 2020-02-22 DIAGNOSIS — C7951 Secondary malignant neoplasm of bone: Secondary | ICD-10-CM

## 2020-02-22 DIAGNOSIS — Z5112 Encounter for antineoplastic immunotherapy: Secondary | ICD-10-CM

## 2020-02-22 DIAGNOSIS — Z95828 Presence of other vascular implants and grafts: Secondary | ICD-10-CM

## 2020-02-22 LAB — CMP (CANCER CENTER ONLY)
ALT: 20 U/L (ref 0–44)
AST: 19 U/L (ref 15–41)
Albumin: 2.8 g/dL — ABNORMAL LOW (ref 3.5–5.0)
Alkaline Phosphatase: 112 U/L (ref 38–126)
Anion gap: 7 (ref 5–15)
BUN: 12 mg/dL (ref 8–23)
CO2: 24 mmol/L (ref 22–32)
Calcium: 8.5 mg/dL — ABNORMAL LOW (ref 8.9–10.3)
Chloride: 106 mmol/L (ref 98–111)
Creatinine: 0.75 mg/dL (ref 0.61–1.24)
GFR, Estimated: >60 mL/min (ref >60)
Glucose, Bld: 117 mg/dL — ABNORMAL HIGH (ref 70–99)
Potassium: 4.1 mmol/L (ref 3.5–5.1)
Sodium: 137 mmol/L (ref 135–145)
Total Bilirubin: 0.6 mg/dL (ref 0.3–1.2)
Total Protein: 6 g/dL — ABNORMAL LOW (ref 6.5–8.1)

## 2020-02-22 LAB — CBC WITH DIFFERENTIAL (CANCER CENTER ONLY)
Abs Immature Granulocytes: 0.04 10*3/uL (ref 0.00–0.07)
Basophils Absolute: 0 10*3/uL (ref 0.0–0.1)
Basophils Relative: 0 %
Eosinophils Absolute: 0.2 10*3/uL (ref 0.0–0.5)
Eosinophils Relative: 2 %
HCT: 36.5 % — ABNORMAL LOW (ref 39.0–52.0)
Hemoglobin: 12.6 g/dL — ABNORMAL LOW (ref 13.0–17.0)
Immature Granulocytes: 0 %
Lymphocytes Relative: 13 %
Lymphs Abs: 1.4 10*3/uL (ref 0.7–4.0)
MCH: 33.2 pg (ref 26.0–34.0)
MCHC: 34.5 g/dL (ref 30.0–36.0)
MCV: 96.3 fL (ref 80.0–100.0)
Monocytes Absolute: 0.8 10*3/uL (ref 0.1–1.0)
Monocytes Relative: 8 %
Neutro Abs: 8.1 10*3/uL — ABNORMAL HIGH (ref 1.7–7.7)
Neutrophils Relative %: 77 %
Platelet Count: 251 10*3/uL (ref 150–400)
RBC: 3.79 MIL/uL — ABNORMAL LOW (ref 4.22–5.81)
RDW: 12.9 % (ref 11.5–15.5)
WBC Count: 10.5 10*3/uL (ref 4.0–10.5)
nRBC: 0 % (ref 0.0–0.2)

## 2020-02-22 LAB — LIPASE, BLOOD: Lipase: 30 U/L (ref 11–51)

## 2020-02-22 LAB — TSH: TSH: 9.518 u[IU]/mL — ABNORMAL HIGH (ref 0.320–4.118)

## 2020-02-22 LAB — AMYLASE: Amylase: 68 U/L (ref 28–100)

## 2020-02-22 MED ORDER — SODIUM CHLORIDE 0.9 % IV SOLN
Freq: Once | INTRAVENOUS | Status: AC
  Filled 2020-02-22: qty 250

## 2020-02-22 MED ORDER — SODIUM CHLORIDE 0.9% FLUSH
10.0000 mL | INTRAVENOUS | Status: DC | PRN
  Administered 2020-02-22: 10 mL
  Filled 2020-02-22: qty 10

## 2020-02-22 MED ORDER — HEPARIN SOD (PORK) LOCK FLUSH 100 UNIT/ML IV SOLN
500.0000 [IU] | Freq: Once | INTRAVENOUS | Status: AC | PRN
  Administered 2020-02-22: 500 [IU]
  Filled 2020-02-22: qty 5

## 2020-02-22 MED ORDER — SODIUM CHLORIDE 0.9 % IV SOLN
200.0000 mg | Freq: Once | INTRAVENOUS | Status: AC
  Administered 2020-02-22: 200 mg via INTRAVENOUS
  Filled 2020-02-22: qty 8

## 2020-02-22 NOTE — Patient Instructions (Signed)
[facility] Discharge Instructions for Patients Receiving Chemotherapy  Today you received the following monoclonal antibody agent Pembrolizumab (KEYTRUDA).  To help prevent nausea and vomiting after your treatment, we encourage you to take your nausea medication as prescribed.   If you develop nausea and vomiting that is not controlled by your nausea medication, call the clinic.   BELOW ARE SYMPTOMS THAT SHOULD BE REPORTED IMMEDIATELY:  *FEVER GREATER THAN 100.5 F  *CHILLS WITH OR WITHOUT FEVER  NAUSEA AND VOMITING THAT IS NOT CONTROLLED WITH YOUR NAUSEA MEDICATION  *UNUSUAL SHORTNESS OF BREATH  *UNUSUAL BRUISING OR BLEEDING  TENDERNESS IN MOUTH AND THROAT WITH OR WITHOUT PRESENCE OF ULCERS  *URINARY PROBLEMS  *BOWEL PROBLEMS  UNUSUAL RASH Items with * indicate a potential emergency and should be followed up as soon as possible.  Feel free to call the clinic should you have any questions or concerns. The clinic phone number is [phone].  Please show the [place] at check-in to the Emergency Department and triage nurse.

## 2020-02-24 ENCOUNTER — Other Ambulatory Visit: Payer: Self-pay | Admitting: Family Medicine

## 2020-02-24 DIAGNOSIS — E785 Hyperlipidemia, unspecified: Secondary | ICD-10-CM

## 2020-02-29 ENCOUNTER — Other Ambulatory Visit: Payer: Self-pay

## 2020-02-29 ENCOUNTER — Telehealth: Payer: Self-pay | Admitting: Family Medicine

## 2020-02-29 MED ORDER — PANCRELIPASE (LIP-PROT-AMYL) 36000-114000 UNITS PO CPEP
ORAL_CAPSULE | ORAL | 3 refills | Status: DC
Start: 2020-02-29 — End: 2021-02-14

## 2020-02-29 NOTE — Telephone Encounter (Signed)
Victor Huber (wife) called to see if we can schedule a new patient appointment for her husband.   He was referred to you from Dr. Inda Merlin - Oncologist  Can I schedule this patient?

## 2020-02-29 NOTE — Telephone Encounter (Signed)
Pt's spouse is requesting a call back from a nurse to discuss the pt's Creon.

## 2020-02-29 NOTE — Telephone Encounter (Signed)
Sorry but I am too full right now

## 2020-02-29 NOTE — Telephone Encounter (Signed)
Returned call to [name](Spouse) informed her that new rx for Creon (increase dose) was sent to pharmacy on 02/05/20. I have called and spoke with Pharmacy at [facility]- gave verbal rx and re-faxed RX to pharmacy.

## 2020-03-02 ENCOUNTER — Other Ambulatory Visit: Payer: Self-pay | Admitting: Cardiovascular Disease

## 2020-03-02 DIAGNOSIS — C3492 Malignant neoplasm of unspecified part of left bronchus or lung: Secondary | ICD-10-CM

## 2020-03-02 DIAGNOSIS — C3490 Malignant neoplasm of unspecified part of unspecified bronchus or lung: Secondary | ICD-10-CM

## 2020-03-02 DIAGNOSIS — M25561 Pain in right knee: Secondary | ICD-10-CM | POA: Diagnosis not present

## 2020-03-03 DIAGNOSIS — H16223 Keratoconjunctivitis sicca, not specified as Sjogren's, bilateral: Secondary | ICD-10-CM | POA: Diagnosis not present

## 2020-03-03 DIAGNOSIS — H02884 Meibomian gland dysfunction left upper eyelid: Secondary | ICD-10-CM | POA: Diagnosis not present

## 2020-03-03 DIAGNOSIS — H02881 Meibomian gland dysfunction right upper eyelid: Secondary | ICD-10-CM | POA: Diagnosis not present

## 2020-03-08 ENCOUNTER — Ambulatory Visit (INDEPENDENT_AMBULATORY_CARE_PROVIDER_SITE_OTHER): Payer: Medicare HMO | Admitting: Family Medicine

## 2020-03-08 ENCOUNTER — Other Ambulatory Visit: Payer: Self-pay

## 2020-03-08 ENCOUNTER — Encounter: Payer: Self-pay | Admitting: Family Medicine

## 2020-03-08 VITALS — BP 132/68 | HR 67 | Temp 97.8°F | Ht 73.0 in | Wt 204.0 lb

## 2020-03-08 DIAGNOSIS — M25561 Pain in right knee: Secondary | ICD-10-CM | POA: Diagnosis not present

## 2020-03-08 DIAGNOSIS — K209 Esophagitis, unspecified without bleeding: Secondary | ICD-10-CM

## 2020-03-08 DIAGNOSIS — N401 Enlarged prostate with lower urinary tract symptoms: Secondary | ICD-10-CM

## 2020-03-08 DIAGNOSIS — K861 Other chronic pancreatitis: Secondary | ICD-10-CM | POA: Diagnosis not present

## 2020-03-08 DIAGNOSIS — C3492 Malignant neoplasm of unspecified part of left bronchus or lung: Secondary | ICD-10-CM | POA: Diagnosis not present

## 2020-03-08 DIAGNOSIS — E785 Hyperlipidemia, unspecified: Secondary | ICD-10-CM

## 2020-03-08 DIAGNOSIS — E032 Hypothyroidism due to medicaments and other exogenous substances: Secondary | ICD-10-CM

## 2020-03-08 DIAGNOSIS — I482 Chronic atrial fibrillation, unspecified: Secondary | ICD-10-CM | POA: Diagnosis not present

## 2020-03-08 DIAGNOSIS — F419 Anxiety disorder, unspecified: Secondary | ICD-10-CM | POA: Diagnosis not present

## 2020-03-08 DIAGNOSIS — J441 Chronic obstructive pulmonary disease with (acute) exacerbation: Secondary | ICD-10-CM | POA: Diagnosis not present

## 2020-03-08 DIAGNOSIS — R3911 Hesitancy of micturition: Secondary | ICD-10-CM

## 2020-03-08 MED ORDER — SALINE SPRAY 0.65 % NA SOLN: 1.0000 | NASAL | 0 refills | Status: DC | PRN

## 2020-03-08 MED ORDER — LEVOTHYROXINE SODIUM 175 MCG PO TABS
175.0000 ug | ORAL_TABLET | Freq: Every day | ORAL | 3 refills | Status: DC
Start: 2020-03-08 — End: 2020-06-16

## 2020-03-08 NOTE — Assessment & Plan Note (Signed)
Follows with pulmonology.  He is on albuterol as needed doing well.

## 2020-03-08 NOTE — Assessment & Plan Note (Signed)
On Eliquis and atenolol.

## 2020-03-08 NOTE — Progress Notes (Signed)
Victor Huber is a 69 y.o. male who presents today for an office visit.  Assessment/Plan:  New/Acute Problems: Xerostomia Not currently on antihistamines or other medications that potentially could cause dry mouth.  He does have hypothyroidism which could be playing a role.  See below.  He also has a history of radiation therapy for his lung cancer.  This may be playing a role as well.  We will treat with topical saline.  No improvement will need referral to ENT.  Chronic Problems Addressed Today: Chronic pancreatitis ([place]) Managed by GI.  Currently on Creon.  Esophagitis Follows with GI.  Has evidence of xerostomia which is also playing a role.  Has endoscopy coming up later this week.  Adenocarcinoma of left lung, stage 4 [facility]) Following with pulmonology and oncology.  Anxiety disorder Stable on Effexor 37.5 milligrams twice daily.  Dyslipidemia Last lipid panel at goal.  Continue Lipitor 40 mg daily.  We need to check lipids at next blood draw at CPE in 3 months.  Hypothyroid Last TSH 9.  Has several symptoms of low thyroid including fatigue, lethargy, and dry mouth.  Complicated by history of lung cancer on chemotherapy.  Will increase Synthroid to 200 mcg daily recheck in 4 to 6 weeks.  If continues to have symptoms and poorly controlled TSH may need referral to endocrinology.  Chronic obstructive pulmonary disease ([place]) Follows with pulmonology.  He is on albuterol as needed doing well.  Chronic atrial fibrillation ([facility]) On Eliquis and atenolol.  BPH s/p TURP Stable on tamsulosin 0.4 mg daily.     Subjective:  HPI:  Patient is here as a new patient.  He has previously been seen in our system but is a new patient to me.  He has extensive medical history significant for chronic pancreatitis and metastatic lung cancer.  He follows with oncology and GI.  He also has COPD and follows with pulmonology.  Has history of atrial fibrillation is on Eliquis and atenolol.   Follows with cardiology  Over the last 6 weeks he has had more issues with sore throat and throat irritation.  Has seen GI.  Will be getting endoscopy later this week.  There was some concern for esophagitis and possible thrush.  He has tried prednisone and azithromycin with no significant improvement.  No fevers or chills.  PMH:  The following were reviewed and entered/updated in epic: Past Medical History:  Diagnosis Date  . Adenocarcinoma of left lung, stage 3 ([place]) 08/15/2016  . Anxiety   . Arthritis   . Atrial fibrillation ([place]) 08/15/2016  . Atrial fibrillation ([place])   . Cancer, metastatic to bone ([place])   . COPD (chronic obstructive pulmonary disease) ([place]) 08/15/2016  . Depression   . Dysphagia   . Dysrhythmia    a-fib  . History of chemotherapy   . History of radiation therapy   . Hyperlipidemia   . Hypothyroid 08/15/2016  . Longstanding persistent atrial fibrillation ([place]) 08/29/2016  . Pathologic fracture    left femur  . Pneumonitis   . S/P TURP 08/15/2016  . Wears glasses   . Wears hearing aid in both ears    Patient Active Problem List   Diagnosis Date Noted  . Esophagitis 01/31/2020  . Exocrine pancreatic insufficiency 01/31/2020  . Chronic pancreatitis ([place]) 01/31/2020  . Dyspnea 09/09/2019  . Slow transit constipation 08/17/2019  . BPH s/p TURP 03/10/2019  . Chronic low back pain 02/03/2019  . Malignant neoplasm of unspecified part of unspecified  bronchus or lung ([place]) 02/03/2019  . Port-A-Cath in place 08/22/2017  . Adenocarcinoma of left lung, stage 4 ([place]) 02/26/2017  . Metastasis to bone ([place]) 02/19/2017  . Bone metastasis ([place]), left femur 01/24/2017  . Dyslipidemia 09/26/2016  . Anxiety disorder 09/26/2016  . Chronic atrial fibrillation ([place]) 08/15/2016  . Chronic obstructive pulmonary disease ([place]) 08/15/2016  . Hypothyroid 08/15/2016   Past Surgical History:  Procedure Laterality Date  . BIOPSY  12/21/2019   Procedure: BIOPSY;  Surgeon:  Rush Landmark Telford Nab., MD;  Location: [facility];  Service: Gastroenterology;;  . BRONCHOSCOPY  10/2014  . CARDIAC CATHETERIZATION     05/07/12  . CARDIOVERSION     x2  . COLONOSCOPY    . DG BIOPSY LUNG Left 10/2014   FNA - Adenocarcinoma   . ESOPHAGOGASTRODUODENOSCOPY (EGD) WITH PROPOFOL N/A 12/21/2019   Procedure: ESOPHAGOGASTRODUODENOSCOPY (EGD) WITH PROPOFOL;  Surgeon: Rush Landmark Telford Nab., MD;  Location: [place] [facility];  Service: Gastroenterology;  Laterality: N/A;  . EUS N/A 12/21/2019   Procedure: UPPER ENDOSCOPIC ULTRASOUND (EUS) RADIAL;  Surgeon: Rush Landmark Telford Nab., MD;  Location: [facility];  Service: Gastroenterology;  Laterality: N/A;  . FEMUR IM NAIL Left 02/19/2017  . FEMUR IM NAIL Left 02/19/2017   Procedure: INTRAMEDULLARY (IM) NAIL FEMORAL;  Surgeon: Marchia Bond, MD;  Location: [facility];  Service: Orthopedics;  Laterality: Left;  . IR FLUORO GUIDE PORT INSERTION RIGHT  07/03/2017  . IR US GUIDE VASC ACCESS RIGHT  07/03/2017  . LUNG CANCER SURGERY Left 12/2014   Wedge Resection   . MULTIPLE TOOTH EXTRACTIONS    . Status post TURP    . TONSILLECTOMY      Family History  Problem Relation Age of Onset  . Breast cancer Mother   . Breast cancer Maternal Aunt   . Breast cancer Maternal Aunt   . Lung disease Neg Hx   . Colon cancer Neg Hx   . Stomach cancer Neg Hx   . Pancreatic cancer Neg Hx   . Rectal cancer Neg Hx     Medications- reviewed and updated Current Outpatient Medications  Medication Sig Dispense Refill  . albuterol (VENTOLIN HFA) 108 (90 Base) MCG/ACT inhaler Inhale 1-2 puffs into the lungs every 4 (four) hours as needed for wheezing or shortness of breath. 3 month supply 24 g 3  . atenolol (TENORMIN) 50 MG tablet TAKE 1 TABLET EVERY DAY 90 tablet 3  . atorvastatin (LIPITOR) 40 MG tablet TAKE 1 TABLET EVERY DAY 90 tablet 2  . ELIQUIS 5 MG TABS tablet TAKE 1 TABLET TWICE DAILY (Patient taking differently: Take 5 mg by mouth 2 (two) times daily. )  180 tablet 1  . ibuprofen (ADVIL,MOTRIN) 400 MG tablet Take 400 mg by mouth every 4 (four) hours as needed for moderate pain.    Marland Kitchen levothyroxine (SYNTHROID) 175 MCG tablet Take 1 tablet (175 mcg total) by mouth daily before breakfast. Take with 25 mcg to equal 200 mcg daily 90 tablet 3  . lipase/protease/amylase (CREON) 36000 UNITS CPEP capsule Take 4 capsules po during each meal. Take 1-2 capsules po during each snack.( up to 2 snacks daily). 480 capsule 3  . LOTEMAX SM 0.38 % GEL     . magic mouthwash SOLN Take 5 mLs by mouth 4 (four) times daily as needed for mouth pain. 240 mL 2  . Oxycodone HCl 10 MG TABS Take 1 tablet (10 mg total) by mouth daily as needed (pain). 30 tablet 0  . pantoprazole (PROTONIX) 40 MG  tablet Take 1 tablet (40 mg total) by mouth 2 (two) times daily before a meal. 60 tablet 4  . tamsulosin (FLOMAX) 0.4 MG CAPS capsule Take 1 capsule (0.4 mg total) by mouth daily. 90 capsule 4  . venlafaxine (EFFEXOR) 37.5 MG tablet 37.5 mg in the morning and at bedtime.    . sodium chloride (OCEAN) 0.65 % SOLN nasal spray Place 1 spray into both nostrils as needed for congestion. 480 mL 0   No current facility-administered medications for this visit.    Allergies-reviewed and updated Allergies  Allergen Reactions  . Carboplatin Itching, Nausea And Vomiting and Other (See Comments)    Flushing  . Flecainide Hypertension    CAUSED HEART ISSUES   . Debrox [Carbamide Peroxide]     Swelling in ear canal.     Social History   Socioeconomic History  . Marital status: Married    Spouse name: Not on file  . Number of children: Not on file  . Years of education: Not on file  . Highest education level: Not on file  Occupational History  . Not on file  Tobacco Use  . Smoking status: Former Smoker    Packs/day: 1.00    Years: 50.00    Pack years: 50.00    Types: Cigarettes    Quit date: 08/15/2012    Years since quitting: 7.5  . Smokeless tobacco: Never Used  Vaping Use  .  Vaping Use: Never used  Substance and Sexual Activity  . Alcohol use: No  . Drug use: No  . Sexual activity: Not on file  Other Topics Concern  . Not on file  Social History Narrative   [facility] (09/26/16):   Originally from [place]. Moved to [facility] February 2018. Always lived in [place]. Moved to be closer to children & grandchildren. No international travel. 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 No mold exposure. No bird exposure or hot tub exposure. Enjoys reading. Previously enjoyed wood working with domestic woods.    Social Determinants of Health   Financial Resource Strain: Low Risk   . Difficulty of Paying Living Expenses: Not hard at all  Food Insecurity: No Food Insecurity  . Worried About Charity fundraiser in the Last Year: Never true  . Ran Out of Food in the Last Year: Never true  Transportation Needs: No Transportation Needs  . Lack of Transportation (Medical): No  . Lack of Transportation (Non-Medical): No  Physical Activity:   . Days of Exercise per Week: Not on file  . Minutes of Exercise per Session: Not on file  Stress:   . Feeling of Stress : Not on file  Social Connections:   . Frequency of Communication with Friends and Family: Not on file  . Frequency of Social Gatherings with Friends and Family: Not on file  . Attends Religious Services: Not on file  . Active Member of Clubs or Organizations: Not on file  . Attends Archivist Meetings: Not on file  . Marital Status: Not on file          Objective:  Physical Exam: BP 132/68   Pulse 67   Temp 97.8 F (36.6 C) (Temporal)   Ht 6\' 1"  (1.854 m)   Wt 204 lb (92.5 kg)   SpO2 96%   BMI 26.91 kg/m   Gen: No acute distress, resting comfortably HEENT: TMs clear.  Xerostomia noted. CV: Irregular  with no  murmurs appreciated Pulm: Normal work of breathing, clear to auscultation bilaterally with no crackles, wheezes, or  rhonchi Neuro: Grossly normal, moves all extremities Psych: Normal affect and thought content  Time Spent: 51 minutes of total time was spent on the date of the encounter performing the following actions: chart review prior to seeing the patient, obtaining history, performing a medically necessary exam, counseling on the treatment plan, placing orders, and documenting in our EHR.        Algis Greenhouse. Jerline Pain, MD 03/08/2020 1:53 PM

## 2020-03-08 NOTE — Assessment & Plan Note (Signed)
Follows with GI.  Has evidence of xerostomia which is also playing a role.  Has endoscopy coming up later this week.

## 2020-03-08 NOTE — Patient Instructions (Signed)
It was very nice to see you today!  You have a very dry mouth and nasal passageways.  Please try using the saline spray to see if this helps.  Please let me know if not and I can refer you to see an ear nose and throat doctor.  We will increase her Synthroid to 200 mcg daily.  We should recheck your TSH in 4 to 6 weeks.  I will see you back in about 3 months for your annual physical.  Please come back to see me sooner if needed.  Take care, Dr Jerline Pain  Please try these tips to maintain a healthy lifestyle:   Eat at least 3 REAL meals and 1-2 snacks per day.  Aim for no more than 5 hours between eating.  If you eat breakfast, please do so within one hour of getting up.    Each meal should contain half fruits/vegetables, one quarter protein, and one quarter carbs (no bigger than a computer mouse)   Cut down on sweet beverages. This includes juice, soda, and sweet tea.     Drink at least 1 glass of water with each meal and aim for at least 8 glasses per day   Exercise at least 150 minutes every week.

## 2020-03-08 NOTE — Assessment & Plan Note (Signed)
Last TSH 9.  Has several symptoms of low thyroid including fatigue, lethargy, and dry mouth.  Complicated by history of lung cancer on chemotherapy.  Will increase Synthroid to 200 mcg daily recheck in 4 to 6 weeks.  If continues to have symptoms and poorly controlled TSH may need referral to endocrinology.

## 2020-03-08 NOTE — Assessment & Plan Note (Signed)
Stable on Effexor 37.5 milligrams twice daily.

## 2020-03-08 NOTE — Assessment & Plan Note (Signed)
Last lipid panel at goal.  Continue Lipitor 40 mg daily.  We need to check lipids at next blood draw at CPE in 3 months.

## 2020-03-08 NOTE — Assessment & Plan Note (Signed)
Managed by GI.  Currently on Creon.

## 2020-03-08 NOTE — Assessment & Plan Note (Signed)
Following with pulmonology and oncology.

## 2020-03-08 NOTE — Assessment & Plan Note (Signed)
Stable on tamsulosin 0.4 mg daily.

## 2020-03-11 ENCOUNTER — Encounter: Payer: Self-pay | Admitting: Gastroenterology

## 2020-03-11 ENCOUNTER — Ambulatory Visit (AMBULATORY_SURGERY_CENTER): Payer: Medicare HMO | Admitting: Gastroenterology

## 2020-03-11 ENCOUNTER — Other Ambulatory Visit: Payer: Self-pay

## 2020-03-11 VITALS — BP 99/63 | HR 82 | Temp 95.7°F | Resp 13 | Ht 73.0 in | Wt 218.0 lb

## 2020-03-11 DIAGNOSIS — R1310 Dysphagia, unspecified: Secondary | ICD-10-CM | POA: Diagnosis not present

## 2020-03-11 DIAGNOSIS — K222 Esophageal obstruction: Secondary | ICD-10-CM

## 2020-03-11 DIAGNOSIS — K449 Diaphragmatic hernia without obstruction or gangrene: Secondary | ICD-10-CM | POA: Diagnosis not present

## 2020-03-11 DIAGNOSIS — K2100 Gastro-esophageal reflux disease with esophagitis, without bleeding: Secondary | ICD-10-CM

## 2020-03-11 DIAGNOSIS — K209 Esophagitis, unspecified without bleeding: Secondary | ICD-10-CM | POA: Diagnosis not present

## 2020-03-11 DIAGNOSIS — K2210 Ulcer of esophagus without bleeding: Secondary | ICD-10-CM | POA: Diagnosis not present

## 2020-03-11 MED ORDER — ESOMEPRAZOLE MAGNESIUM 40 MG PO CPDR: 40.0000 mg | DELAYED_RELEASE_CAPSULE | Freq: Two times a day (BID) | ORAL | 3 refills | Status: DC

## 2020-03-11 MED ORDER — SODIUM CHLORIDE 0.9 % IV SOLN: 500.0000 mL | Freq: Once | INTRAVENOUS | Status: DC

## 2020-03-11 MED ORDER — SUCRALFATE 1 GM/10ML PO SUSP: 1.0000 g | Freq: Three times a day (TID) | ORAL | 1 refills | Status: DC

## 2020-03-11 NOTE — Progress Notes (Signed)
A and O x3. Report to RN. Tolerated MAC anesthesia well.Teeth unchanged after procedure.

## 2020-03-11 NOTE — Progress Notes (Signed)
VS by Esko. 

## 2020-03-11 NOTE — Op Note (Signed)
[facility] Patient Name: Victor Huber Procedure Date: 03/11/2020 2:07 PM MRN: [id number] Endoscopist: Justice Britain , MD Age: 69 Referring MD:  Date of Birth: 03/02/1951 Gender: Male Account #: [id number] Procedure:                Upper GI endoscopy Indications:              Dysphagia, Stenosis of the esophagus, Follow-up of                            esophageal stenosis Medicines:                Monitored Anesthesia Care Procedure:                Pre-Anesthesia Assessment:                           - Prior to the procedure, a History and Physical                            was performed, and patient medications and                            allergies were reviewed. The patient's tolerance of                            previous anesthesia was also reviewed. The risks                            and benefits of the procedure and the sedation                            options and risks were discussed with the patient.                            All questions were answered, and informed consent                            was obtained. Prior Anticoagulants: The patient has                            taken Eliquis (apixaban), last dose was 2 days                            prior to procedure. ASA Grade Assessment: III - A                            patient with severe systemic disease. After                            reviewing the risks and benefits, the patient was                            deemed in satisfactory condition to undergo the  procedure.                           After obtaining informed consent, the endoscope was                            passed under direct vision. Throughout the                            procedure, the patient's blood pressure, pulse, and                            oxygen saturations were monitored continuously. The                            Endoscope was introduced through the mouth, and                             advanced to the second part of duodenum. The upper                            GI endoscopy was accomplished without difficulty.                            The patient tolerated the procedure. Scope In: Scope Out: Findings:                 LA Grade D (one or more mucosal breaks involving at                            least 75% of esophageal circumference) esophagitis                            with bleeding was found 24 to 34 cm from the                            incisors - this does not look much better than                            prior. Biopsies were taken with a cold forceps for                            histology to rule out viral esophagitis..                           One moderate (circumferential scarring or stenosis;                            an endoscope may pass) stenosis was found in the                            mid esophagus as a result of the above noted  esophagitis. This stenosis measured at least 8 mm                            (inner diameter). The stenosis was traversed by the                            endoscope and caused significant mucosal                            wrenting/tear.                           The Z-line was irregular and was found 40 cm from                            the incisors.                           A 2 cm hiatal hernia was present.                           No gross mucosal lesions were noted in the entire                            examined stomach.                           No gross lesions were noted in the duodenal bulb,                            in the first portion of the duodenum and in the                            second portion of the duodenum. Complications:            No immediate complications. Estimated Blood Loss:     Estimated blood loss was minimal. Impression:               - LA Grade D erosive esophagitis with bleeding in                            middle esophagus - not  significantly better than                            prior. Biopsied.                           - Esophageal stenosis - traversed and dilated by                            endoscope with mucosal wrenting.                           - Z-line irregular, 40 cm from the incisors.                           -  2 cm hiatal hernia.                           - No gross mucosa lesions in the stomach.                           - No gross lesions in the duodenal bulb, in the                            first portion of the duodenum and in the second                            portion of the duodenum. Recommendation:           - The patient will be observed post-procedure,                            until all discharge criteria are met.                           - Discharge patient to home.                           - Patient has a contact number available for                            emergencies. The signs and symptoms of potential                            delayed complications were discussed with the                            patient. Return to normal activities tomorrow.                            Written discharge instructions were provided to the                            patient.                           - Dilation diet as per protocol.                           - Continue Protonix 40 mg twice daily until new                            prescription for Nexium 40 mg twice daily is                            received.                           - Start Carafate 1 g liquid with each meal and 1  time at bedtime.                           - May restart Eliquis in 48 hours (12/12 PM) to                            decrease risk of post-interventional bleeding.                           - Await pathology results.                           - Observe patient's clinical course.                           - Repeat EGD in 5-6 weeks for further dilation.                            - The findings and recommendations were discussed                            with the patient.                           - The findings and recommendations were discussed                            with the patient's family. Justice Britain, MD 03/11/2020 2:53:39 PM

## 2020-03-11 NOTE — Patient Instructions (Signed)
Handout given for Hiatal hernia.  Continue your Protonix 40 mg twice a day until you get your Nexium by mail order.  Start Carafate 1 g liquid with each meal and at bedtime.  Start Eliquis on 12/12 at usual dosage.  Await pathology and that will determine the next EGD that you will need.  YOU HAD AN ENDOSCOPIC PROCEDURE TODAY AT [facility]:   Refer to the procedure report that was given to you for any specific questions about what was found during the examination.  If the procedure report does not answer your questions, please call your gastroenterologist to clarify.  If you requested that your care partner not be given the details of your procedure findings, then the procedure report has been included in a sealed envelope for you to review at your convenience later.  YOU SHOULD EXPECT: Some feelings of bloating in the abdomen. Passage of more gas than usual.  Walking can help get rid of the air that was put into your GI tract during the procedure and reduce the bloating. If you had a lower endoscopy (such as a colonoscopy or flexible sigmoidoscopy) you may notice spotting of blood in your stool or on the toilet paper. If you underwent a bowel prep for your procedure, you may not have a normal bowel movement for a few days.  Please Note:  You might notice some irritation and congestion in your nose or some drainage.  This is from the oxygen used during your procedure.  There is no need for concern and it should clear up in a day or so.  SYMPTOMS TO REPORT IMMEDIATELY:   Following upper endoscopy (EGD)  Vomiting of blood or coffee ground material  New chest pain or pain under the shoulder blades  Painful or persistently difficult swallowing  New shortness of breath  Fever of 100F or higher  Black, tarry-looking stools  For urgent or emergent issues, a gastroenterologist can be reached at any hour by calling [phone]. Do not use MyChart messaging for urgent  concerns.    DIET:  We do recommend a small meal at first, but then you may proceed to your regular diet.  Drink plenty of fluids but you should avoid alcoholic beverages for 24 hours.  ACTIVITY:  You should plan to take it easy for the rest of today and you should NOT DRIVE or use heavy machinery until tomorrow (because of the sedation medicines used during the test).    FOLLOW UP: Our staff will call the number listed on your records 48-72 hours following your procedure to check on you and address any questions or concerns that you may have regarding the information given to you following your procedure. If we do not reach you, we will leave a message.  We will attempt to reach you two times.  During this call, we will ask if you have developed any symptoms of COVID 19. If you develop any symptoms (ie: fever, flu-like symptoms, shortness of breath, cough etc.) before then, please call [phone].  If you test positive for Covid 19 in the 2 weeks post procedure, please call and report this information to [place].    If any biopsies were taken you will be contacted by phone or by letter within the next 1-3 weeks.  Please call us at [phone] if you have not heard about the biopsies in 3 weeks.    SIGNATURES/CONFIDENTIALITY: You and/or your care partner have signed paperwork which will be entered into  your electronic medical record.  These signatures attest to the fact that that the information above on your After Visit Summary has been reviewed and is understood.  Full responsibility of the confidentiality of this discharge information lies with you and/or your care-partner.

## 2020-03-14 ENCOUNTER — Inpatient Hospital Stay: Payer: Medicare HMO

## 2020-03-14 ENCOUNTER — Other Ambulatory Visit: Payer: Self-pay

## 2020-03-14 ENCOUNTER — Encounter: Payer: Medicare HMO | Admitting: Nutrition

## 2020-03-14 ENCOUNTER — Encounter: Payer: Self-pay | Admitting: Internal Medicine

## 2020-03-14 ENCOUNTER — Inpatient Hospital Stay: Payer: Medicare HMO | Attending: Internal Medicine | Admitting: Internal Medicine

## 2020-03-14 ENCOUNTER — Telehealth: Payer: Self-pay | Admitting: Physician Assistant

## 2020-03-14 VITALS — BP 102/70 | HR 76 | Temp 98.9°F | Resp 18 | Ht 72.0 in | Wt 198.6 lb

## 2020-03-14 DIAGNOSIS — C3432 Malignant neoplasm of lower lobe, left bronchus or lung: Secondary | ICD-10-CM | POA: Diagnosis not present

## 2020-03-14 DIAGNOSIS — Z95828 Presence of other vascular implants and grafts: Secondary | ICD-10-CM

## 2020-03-14 DIAGNOSIS — Z5112 Encounter for antineoplastic immunotherapy: Secondary | ICD-10-CM | POA: Diagnosis not present

## 2020-03-14 DIAGNOSIS — C770 Secondary and unspecified malignant neoplasm of lymph nodes of head, face and neck: Secondary | ICD-10-CM

## 2020-03-14 DIAGNOSIS — Z9221 Personal history of antineoplastic chemotherapy: Secondary | ICD-10-CM | POA: Diagnosis not present

## 2020-03-14 DIAGNOSIS — C7951 Secondary malignant neoplasm of bone: Secondary | ICD-10-CM

## 2020-03-14 DIAGNOSIS — C3492 Malignant neoplasm of unspecified part of left bronchus or lung: Secondary | ICD-10-CM

## 2020-03-14 DIAGNOSIS — E039 Hypothyroidism, unspecified: Secondary | ICD-10-CM | POA: Diagnosis not present

## 2020-03-14 DIAGNOSIS — C3490 Malignant neoplasm of unspecified part of unspecified bronchus or lung: Secondary | ICD-10-CM

## 2020-03-14 DIAGNOSIS — Z923 Personal history of irradiation: Secondary | ICD-10-CM

## 2020-03-14 DIAGNOSIS — K859 Acute pancreatitis without necrosis or infection, unspecified: Secondary | ICD-10-CM

## 2020-03-14 DIAGNOSIS — Z79899 Other long term (current) drug therapy: Secondary | ICD-10-CM | POA: Diagnosis not present

## 2020-03-14 LAB — CBC WITH DIFFERENTIAL (CANCER CENTER ONLY)
Abs Immature Granulocytes: 0.03 10*3/uL (ref 0.00–0.07)
Basophils Absolute: 0 10*3/uL (ref 0.0–0.1)
Basophils Relative: 0 %
Eosinophils Absolute: 0.2 10*3/uL (ref 0.0–0.5)
Eosinophils Relative: 2 %
HCT: 37.5 % — ABNORMAL LOW (ref 39.0–52.0)
Hemoglobin: 12.8 g/dL — ABNORMAL LOW (ref 13.0–17.0)
Immature Granulocytes: 0 %
Lymphocytes Relative: 18 %
Lymphs Abs: 1.6 10*3/uL (ref 0.7–4.0)
MCH: 31.9 pg (ref 26.0–34.0)
MCHC: 34.1 g/dL (ref 30.0–36.0)
MCV: 93.5 fL (ref 80.0–100.0)
Monocytes Absolute: 0.6 10*3/uL (ref 0.1–1.0)
Monocytes Relative: 7 %
Neutro Abs: 6.3 10*3/uL (ref 1.7–7.7)
Neutrophils Relative %: 73 %
Platelet Count: 310 10*3/uL (ref 150–400)
RBC: 4.01 MIL/uL — ABNORMAL LOW (ref 4.22–5.81)
RDW: 13.2 % (ref 11.5–15.5)
WBC Count: 8.7 10*3/uL (ref 4.0–10.5)
nRBC: 0 % (ref 0.0–0.2)

## 2020-03-14 LAB — CMP (CANCER CENTER ONLY)
ALT: 21 U/L (ref 0–44)
AST: 22 U/L (ref 15–41)
Albumin: 2.7 g/dL — ABNORMAL LOW (ref 3.5–5.0)
Alkaline Phosphatase: 114 U/L (ref 38–126)
Anion gap: 8 (ref 5–15)
BUN: 11 mg/dL (ref 8–23)
CO2: 24 mmol/L (ref 22–32)
Calcium: 8.6 mg/dL — ABNORMAL LOW (ref 8.9–10.3)
Chloride: 104 mmol/L (ref 98–111)
Creatinine: 0.77 mg/dL (ref 0.61–1.24)
GFR, Estimated: >60 mL/min (ref >60)
Glucose, Bld: 113 mg/dL — ABNORMAL HIGH (ref 70–99)
Potassium: 3.7 mmol/L (ref 3.5–5.1)
Sodium: 136 mmol/L (ref 135–145)
Total Bilirubin: 0.8 mg/dL (ref 0.3–1.2)
Total Protein: 6.1 g/dL — ABNORMAL LOW (ref 6.5–8.1)

## 2020-03-14 LAB — TSH: TSH: 28.522 u[IU]/mL — ABNORMAL HIGH (ref 0.320–4.118)

## 2020-03-14 MED ORDER — HEPARIN SOD (PORK) LOCK FLUSH 100 UNIT/ML IV SOLN
500.0000 [IU] | Freq: Once | INTRAVENOUS | Status: AC | PRN
Start: 2020-03-14 — End: 2020-03-14
  Administered 2020-03-14: 16:00:00 500 [IU]
  Filled 2020-03-14: qty 5

## 2020-03-14 MED ORDER — SODIUM CHLORIDE 0.9% FLUSH
10.0000 mL | INTRAVENOUS | Status: DC | PRN
  Administered 2020-03-14: 16:00:00 10 mL
  Filled 2020-03-14: qty 10

## 2020-03-14 MED ORDER — SODIUM CHLORIDE 0.9 % IV SOLN
Freq: Once | INTRAVENOUS | Status: AC
  Filled 2020-03-14: qty 250

## 2020-03-14 MED ORDER — SODIUM CHLORIDE 0.9% FLUSH
10.0000 mL | INTRAVENOUS | Status: DC | PRN
  Administered 2020-03-14: 14:00:00 10 mL
  Filled 2020-03-14: qty 10

## 2020-03-14 MED ORDER — SODIUM CHLORIDE 0.9 % IV SOLN
200.0000 mg | Freq: Once | INTRAVENOUS | Status: AC
  Administered 2020-03-14: 16:00:00 200 mg via INTRAVENOUS
  Filled 2020-03-14: qty 8

## 2020-03-14 NOTE — Progress Notes (Signed)
[place] [phone]   Fax:([phone]  OFFICE PROGRESS NOTE  [name], MD [address] [place] [address]  DIAGNOSIS: Metastatic non-small cell lung cancer initially diagnosed as stage IIIA (T2a, N2, M0) non-small cell lung cancer, poorly differentiated adenocarcinoma presented with left lower lobe lung mass in addition to mediastinal lymphadenopathy.  The patient was diagnosed with metastatic disease involving the left femur as well as left supraclavicular nodal metastases and right paratracheal lymphadenopathy in October 2018.  Biomarker Findings Microsatellite Status - MS-Stable Tumor Mutational Burden - TMB-Low (3 Muts/Mb) Genomic Findings For a complete list of the genes assayed, please refer to the Appendix. STK11 P258f*6 CGQCR0HpI20PDPsplice site 1241+5J>JDAXX E374* MLL2 [id number] NBN K23343f NOTCH2 [id number] splice site [id number]>WDisease relevant genes with no reportable alterations: EGFR, KRAS, ALK, BRAF, MET, RET, ERBB2, ROS1   PDL1 expression 5%  PRIOR THERAPY: 1) status post wedge resection of the left lower lobe lung mass as well as AP window lymph node dissection but there was residual metastatic mediastinal lymphadenopathy that could not be resected. 2) a course of concurrent chemoradiation [place] carboplatin [place]  3) status post palliative radiotherapy to the left femur metastatic bone disease. 4)  Systemic chemotherapy with carboplatin for AUC of 5, Alimta 500 mg/M2 and Keytruda 200 mg IV every 3 weeks.  First dose March 07, 2017.  Carboplatin was discontinued during cycle #2 secondary to hypersensitivity reaction. 5) status post 2 cycles of maintenance treatment with Alimta and Ketruda (pembrolizumab).  Alimta was discontinued secondary to intolerance.  CURRENT THERAPY: Maintenance treatment with single agent Ketruda (pembrolizumab) status post 48   cycles.  INTERVAL HISTORY: Victor Huber 69o. male returns to the clinic today for follow-up visit.  The patient is feeling fine today with no concerning complaints except for the continuous weight loss and fatigue.  He lost additional 8 pounds since his last visit.  He denied having any current chest pain, shortness of breath, cough or hemoptysis.  He denied having any fever or chills.  He has no nausea, vomiting, diarrhea or constipation.  He has no headache or visual changes.  He is here today for evaluation before starting cycle #49 of his treatment with Keytruda.   MEDICAL HISTORY: Past Medical History:  Diagnosis Date  . Adenocarcinoma of left lung, stage 3 ([place]/16/2018  . Allergy   . Anxiety   . Arthritis   . Atrial fibrillation ([place]/16/2018  . Atrial fibrillation ([place] . Cancer, metastatic to bone ([place] . COPD (chronic obstructive pulmonary disease) ([place]/16/2018  . Depression   . Dysphagia   . Dysrhythmia    a-fib  . GERD (gastroesophageal reflux disease)   . History of chemotherapy   . History of radiation therapy   . Hyperlipidemia   . Hypothyroid 08/15/2016  . Longstanding persistent atrial fibrillation ([place]/30/2018  . Pathologic fracture    left femur  . Pneumonitis   . S/P TURP 08/15/2016  . Wears glasses   . Wears hearing aid in both ears     ALLERGIES:  is allergic to carboplatin, flecainide, and debrox [carbamide peroxide].  MEDICATIONS:  Current Outpatient Medications  Medication Sig Dispense Refill  . albuterol (VENTOLIN HFA) 108 (90 Base) MCG/ACT inhaler Inhale 1-2 puffs into the lungs every 4 (four) hours as needed for wheezing or shortness of breath. 3 month supply 24 g 3  .  atenolol (TENORMIN) 50 MG tablet TAKE 1 TABLET EVERY DAY 90 tablet 3  . atorvastatin (LIPITOR) 40 MG tablet TAKE 1 TABLET EVERY DAY 90 tablet 2  . ELIQUIS 5 MG TABS tablet TAKE 1 TABLET TWICE DAILY (Patient taking differently: Take 5 mg by mouth 2 (two) times  daily.) 180 tablet 1  . esomeprazole (NEXIUM) 40 MG capsule Take 1 capsule (40 mg total) by mouth 2 (two) times daily before a meal. 180 capsule 3  . ibuprofen (ADVIL,MOTRIN) 400 MG tablet Take 400 mg by mouth every 4 (four) hours as needed for moderate pain.    Marland Kitchen levothyroxine (SYNTHROID) 175 MCG tablet Take 1 tablet (175 mcg total) by mouth daily before breakfast. Take with 25 mcg to equal 200 mcg daily 90 tablet 3  . lipase/protease/amylase (CREON) 36000 UNITS CPEP capsule Take 4 capsules po during each meal. Take 1-2 capsules po during each snack.( up to 2 snacks daily). 480 capsule 3  . LOTEMAX SM 0.38 % GEL     . magic mouthwash SOLN Take 5 mLs by mouth 4 (four) times daily as needed for mouth pain. 240 mL 2  . Oxycodone HCl 10 MG TABS Take 1 tablet (10 mg total) by mouth daily as needed (pain). 30 tablet 0  . pantoprazole (PROTONIX) 40 MG tablet Take 1 tablet (40 mg total) by mouth 2 (two) times daily before a meal. 60 tablet 4  . sodium chloride (OCEAN) 0.65 % SOLN nasal spray Place 1 spray into both nostrils as needed for congestion. 480 mL 0  . sucralfate (CARAFATE) 1 GM/10ML suspension Take 10 mLs (1 g total) by mouth 4 (four) times daily -  with meals and at bedtime. 420 mL 1  . tamsulosin (FLOMAX) 0.4 MG CAPS capsule Take 1 capsule (0.4 mg total) by mouth daily. 90 capsule 4  . venlafaxine (EFFEXOR) 37.5 MG tablet 37.5 mg in the morning and at bedtime.     No current facility-administered medications for this visit.   Facility-Administered Medications Ordered in Other Visits  Medication Dose Route Frequency Provider Last Rate Last Admin  . sodium chloride flush (NS) 0.9 % injection 10 mL  10 mL Intracatheter PRN Si Gaul, MD   10 mL at 03/14/20 1337    SURGICAL HISTORY:  Past Surgical History:  Procedure Laterality Date  . BIOPSY  12/21/2019   Procedure: BIOPSY;  Surgeon: Meridee Score [name], MD;  Location: [facility] ENDOSCOPY;  Service: Gastroenterology;;  . BRONCHOSCOPY   10/2014  . CARDIAC CATHETERIZATION     05/07/12  . CARDIOVERSION     x2  . COLONOSCOPY    . DG BIOPSY LUNG Left 10/2014   FNA - Adenocarcinoma   . ESOPHAGOGASTRODUODENOSCOPY (EGD) WITH PROPOFOL N/A 12/21/2019   Procedure: ESOPHAGOGASTRODUODENOSCOPY (EGD) WITH PROPOFOL;  Surgeon: Meridee Score [name], MD;  Location: [facility] ENDOSCOPY;  Service: Gastroenterology;  Laterality: N/A;  . EUS N/A 12/21/2019   Procedure: UPPER ENDOSCOPIC ULTRASOUND (EUS) RADIAL;  Surgeon: Meridee Score [name], MD;  Location: [facility] ENDOSCOPY;  Service: Gastroenterology;  Laterality: N/A;  . FEMUR IM NAIL Left 02/19/2017  . FEMUR IM NAIL Left 02/19/2017   Procedure: INTRAMEDULLARY (IM) NAIL FEMORAL;  Surgeon: Teryl Lucy, MD;  Location: [facility] OR;  Service: Orthopedics;  Laterality: Left;  . IR FLUORO GUIDE PORT INSERTION RIGHT  07/03/2017  . IR US GUIDE VASC ACCESS RIGHT  07/03/2017  . LUNG CANCER SURGERY Left 12/2014   Wedge Resection   . MULTIPLE TOOTH EXTRACTIONS    . Status post  TURP    . TONSILLECTOMY      REVIEW OF SYSTEMS:  A comprehensive review of systems was negative except for: Constitutional: positive for anorexia, fatigue and weight loss   PHYSICAL EXAMINATION: General appearance: alert, cooperative, fatigued and no distress Head: Normocephalic, without obvious abnormality, atraumatic Neck: no adenopathy, no JVD, supple, symmetrical, trachea midline and thyroid not enlarged, symmetric, no tenderness/mass/nodules Lymph nodes: Cervical, supraclavicular, and axillary nodes normal. Resp: clear to auscultation bilaterally Back: symmetric, no curvature. ROM normal. No CVA tenderness. Cardio: regular rate and rhythm, S1, S2 normal, no murmur, click, rub or gallop GI: soft, non-tender; bowel sounds normal; no masses,  no organomegaly Extremities: extremities normal, atraumatic, no cyanosis or edema  ECOG PERFORMANCE STATUS: 1 - Symptomatic but completely ambulatory  Blood pressure 102/70, pulse 76, temperature  98.9 F (37.2 C), temperature source Tympanic, resp. rate 18, height 6' (1.829 m), weight 198 lb 9.6 oz (90.1 kg), SpO2 96 %.  LABORATORY DATA: Lab Results  Component Value Date   WBC 10.5 02/22/2020   HGB 12.6 (L) 02/22/2020   HCT 36.5 (L) 02/22/2020   MCV 96.3 02/22/2020   PLT 251 02/22/2020      Chemistry      Component Value Date/Time   NA 137 02/22/2020 1418   NA 136 04/04/2017 1141   K 4.1 02/22/2020 1418   K 4.4 04/04/2017 1141   CL 106 02/22/2020 1418   CO2 24 02/22/2020 1418   CO2 24 04/04/2017 1141   BUN 12 02/22/2020 1418   BUN 21.1 04/04/2017 1141   CREATININE 0.75 02/22/2020 1418   CREATININE 1.1 04/04/2017 1141      Component Value Date/Time   CALCIUM 8.5 (L) 02/22/2020 1418   CALCIUM 9.1 04/04/2017 1141   ALKPHOS 112 02/22/2020 1418   ALKPHOS 89 04/04/2017 1141   AST 19 02/22/2020 1418   AST 17 04/04/2017 1141   ALT 20 02/22/2020 1418   ALT 18 04/04/2017 1141   BILITOT 0.6 02/22/2020 1418   BILITOT 0.67 04/04/2017 1141       RADIOGRAPHIC STUDIES: No results found.  ASSESSMENT AND PLAN: This is a 69 years old white male with metastatic non-small cell lung cancer, adenocarcinoma with no actionable mutations and PDL 1 expression of 5% that was initially diagnosed as stage IIIa non-small cell lung cancer, adenocarcinoma status post left lower lobectomy with lymph node dissection followed by a course of concurrent chemoradiation completed in January 2016.  The patient had evidence for disease metastasis in October 2018 with metastatic disease to the left femur as well as left supraclavicular and right paratracheal lymph nodes. The patient is currently on systemic chemotherapy initially was with carboplatin, Alimta and Keytruda.  Carboplatin was discontinued secondary to hypersensitivity reaction starting from cycle #2.   He was also treated with 3 cycles of maintenance Alimta and Ketruda (pembrolizumab) but Alimta was discontinued secondary to  intolerance. He is currently undergoing treatment with maintenance Keytruda as a single agent status post 48 cycles.  He has been tolerating the treatment well with no concerning complaints except for the recent diagnosis of pancreatitis which is unclear if it is related to his immunotherapy or other factors. I had a lengthy discussion with the patient today about his current condition and treatment options.  I gave him the option of discontinuing his treatment with immunotherapy and continuous observation and close monitoring but he would like to proceed with the treatment today and may consider holding the treatment after this cycle. I will  arrange for him to have repeat CT scan of the chest, abdomen pelvis in 1 month for restaging of his disease before making the decision regarding stopping the treatment. For the suspicious pancreatitis, he is followed by gastroenterology and currently on treatment with Creon. For the hypothyroidism, he will continue his current treatment with levothyroxine and follow-up with his primary care physician. The patient was advised to call immediately if he has any concerning symptoms in the interval. The patient voices understanding of current disease status and treatment options and is in agreement with the current care plan. All questions were answered. The patient knows to call the clinic with any problems, questions or concerns. We can certainly see the patient much sooner if necessary.  Disclaimer: This note was dictated with voice recognition software. Similar sounding words can inadvertently be transcribed and may not be corrected upon review.

## 2020-03-14 NOTE — Telephone Encounter (Signed)
I saw the patient in the infusion room today. His synthroid was recently adjusted earlier this month by his PCP. Therefore, we will not make any dose adjustments today. We will recheck his TSH at his next appointment.

## 2020-03-15 ENCOUNTER — Telehealth: Payer: Self-pay | Admitting: Internal Medicine

## 2020-03-15 ENCOUNTER — Telehealth: Payer: Self-pay

## 2020-03-15 NOTE — Telephone Encounter (Signed)
  Follow up Call- spoke with wife, Victor Huber.  Patient asleep.  Call back number 03/11/2020  Post procedure Call Back phone  # [phone]  Permission to leave phone message Yes  Some recent data might be hidden     Patient questions:  Do you have a fever, pain , or abdominal swelling? No. Pain Score  0 *  Have you tolerated food without any problems? Yes.    Have you been able to return to your normal activities? Yes.    Do you have any questions about your discharge instructions: Diet   No. Medications  No. Follow up visit  No.  Do you have questions or concerns about your Care? No.  Actions: * If pain score is 4 or above: No action needed  1. Have you developed a fever since your procedure? no  2.   Have you had an respiratory symptoms (SOB or cough) since your procedure? no  3.   Have you tested positive for COVID 19 since your procedure no  4.   Have you had any family members/close contacts diagnosed with the COVID 19 since your procedure?  no   If yes to any of these questions please route to Joylene John, RN and Joella Prince, RN

## 2020-03-15 NOTE — Telephone Encounter (Signed)
Scheduled per los. Called and spoke with patients wife. Confirmed appt

## 2020-03-16 DIAGNOSIS — M25561 Pain in right knee: Secondary | ICD-10-CM | POA: Diagnosis not present

## 2020-03-18 ENCOUNTER — Encounter: Payer: Self-pay | Admitting: Cardiovascular Disease

## 2020-03-18 ENCOUNTER — Ambulatory Visit (INDEPENDENT_AMBULATORY_CARE_PROVIDER_SITE_OTHER): Payer: Medicare HMO | Admitting: Cardiovascular Disease

## 2020-03-18 ENCOUNTER — Other Ambulatory Visit: Payer: Self-pay

## 2020-03-18 VITALS — BP 110/69 | HR 83 | Ht 73.0 in | Wt 200.6 lb

## 2020-03-18 DIAGNOSIS — I4819 Other persistent atrial fibrillation: Secondary | ICD-10-CM | POA: Diagnosis not present

## 2020-03-18 DIAGNOSIS — R0602 Shortness of breath: Secondary | ICD-10-CM

## 2020-03-18 NOTE — Patient Instructions (Signed)
Medication Instructions:  Your physician recommends that you continue on your current medications as directed. Please refer to the Current Medication list given to you today.  *If you need a refill on your cardiac medications before your next appointment, please call your pharmacy*  Lab Work: NONE  Testing/Procedures: Your physician has requested that you have a lexiscan myoview. For further information please visit [email or web address]. Please follow instruction sheet, as given.  Follow-Up: At [facility], you and your health needs are our priority.  As part of our continuing mission to provide you with exceptional heart care, we have created designated Provider Care Teams.  These Care Teams include your primary Cardiologist (physician) and Advanced Practice Providers (APPs -  Physician Assistants and Nurse Practitioners) who all work together to provide you with the care you need, when you need it.  We recommend signing up for the patient portal called "MyChart".  Sign up information is provided on this After Visit Summary.  MyChart is used to connect with patients for Virtual Visits (Telemedicine).  Patients are able to view lab/test results, encounter notes, upcoming appointments, etc.  Non-urgent messages can be sent to your provider as well.   To learn more about what you can do with MyChart, go to [email or web address].    Your next appointment:   12 month(s)  You will receive a reminder letter in the mail two months in advance. If you don't receive a letter, please call our office to schedule the follow-up appointment.  The format for your next appointment:   In Person  Provider:   You may see Skeet Latch, MD  or one of the following Advanced Practice Providers on your designated Care Team:    Kerin Ransom, PA-C  [place], [place]  Coletta Memos, [place]

## 2020-03-18 NOTE — Progress Notes (Signed)
Cardiology Office Note   Date:  04/21/2020   ID:  Victor Huber, DOB May 27, 1950, MRN [id number]  PCP:  [name], MD  Cardiologist:   Skeet Latch, MD   No chief complaint on file.    History of Present Illness: Victor Huber is a 69 y.o. male with persistent atrial fibrillation, COPD, hypothyroidism and metastatic non-small cell lung cancer s/p resection and on chemotherapy and prior tobacco abuse here for follow up.  He moved from [place] 05/2016 and established care with Dr. Julien Nordmann.  He was diagnosed with atrial fibrillation in 2012.  He has been chronically in atrial fibrillation lately.  In the past he was on amiodarone. However this was discontinued when he was diagnosed with lung cancer in 2016. After that he was switched to sotalol but failed subsequent DCCV.  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  Victor Huber reported fatigue and shortness of breath. He was switched from metoprolol to atenolol, given that he previously felt better on this medication.  He was also referred to the atrial fibrillation clinic where he discussed using dofetilide, amiodarone, and ablation. He was not felt to be a good candidate for ablation declined antiarrhythmics due to cost.  Victor Huber continues with [facility] therapy.  This was stopped for a while due to pancreatitis.  He notes that his shortness of breath is getting progressively worse.  He tries to get some exercise by walking but is limited by dyspnea.  He has no chest pain, edema, or orthopnea.  He gets short of breath with minimal movement such as bending over or up a few stairs.  He recently started a new inhaler but doesn't think it helps.  At home his BP is generally controlled.  At times it has been low.  He thinks this is associated with some mild lightheadedness and dizziness.  He also has not been eating well and has no energy.  He does not get much exercise because of lack of energy and  right knee pain.  He has no exertional chest pain.  He denies lower extremity edema, orthopnea, or PND.  HR is usually <100 bpm.  He has struggled with sinus and eye infections.   Past Medical History:  Diagnosis Date  . Adenocarcinoma of left lung, stage 3 ([place]) 08/15/2016  . Allergy   . Anxiety   . Arthritis   . Atrial fibrillation ([place]) 08/15/2016  . Atrial fibrillation ([place])   . Cancer, metastatic to bone ([place])   . Cataract    Waiting to schedule bilateral cataract surgery  . COPD (chronic obstructive pulmonary disease) ([place]) 08/15/2016  . Depression   . Huber   . Dysrhythmia    a-fib  . GERD (gastroesophageal reflux disease)   . Heart murmur    atenlol for A Fib/Eliquis  . History of chemotherapy   . History of radiation therapy   . Hyperlipidemia   . Hypothyroid 08/15/2016  . Longstanding persistent atrial fibrillation ([place]) 08/29/2016  . Pathologic fracture    left femur  . Pneumonitis   . S/P TURP 08/15/2016  . Wears glasses   . Wears hearing aid in both ears     Past Surgical History:  Procedure Laterality Date  . BIOPSY  12/21/2019   Procedure: BIOPSY;  Surgeon: Rush Landmark Telford Nab., MD;  Location: [facility];  Service: Gastroenterology;;  . BRONCHOSCOPY  10/2014  . CARDIAC CATHETERIZATION     05/07/12  . CARDIOVERSION  x2  . COLONOSCOPY     several yrs  . DG BIOPSY LUNG Left 10/2014   FNA - Adenocarcinoma   . ESOPHAGOGASTRODUODENOSCOPY (EGD) WITH PROPOFOL N/A 12/21/2019   Procedure: ESOPHAGOGASTRODUODENOSCOPY (EGD) WITH PROPOFOL;  Surgeon: Rush Landmark Telford Nab., MD;  Location: [facility];  Service: Gastroenterology;  Laterality: N/A;  . EUS N/A 12/21/2019   Procedure: UPPER ENDOSCOPIC ULTRASOUND (EUS) RADIAL;  Surgeon: Rush Landmark Telford Nab., MD;  Location: [facility];  Service: Gastroenterology;  Laterality: N/A;  . FEMUR IM NAIL Left 02/19/2017  . FEMUR IM NAIL Left 02/19/2017   Procedure: INTRAMEDULLARY (IM) NAIL FEMORAL;  Surgeon: Marchia Bond, MD;  Location: [facility];  Service: Orthopedics;  Laterality: Left;  . IR FLUORO GUIDE PORT INSERTION RIGHT  07/03/2017  . IR US GUIDE VASC ACCESS RIGHT  07/03/2017  . LUNG CANCER SURGERY Left 12/2014   [place] Resection   . MULTIPLE TOOTH EXTRACTIONS    . Status post TURP    . TONSILLECTOMY       Current Outpatient Medications  Medication Sig Dispense Refill  . albuterol (VENTOLIN HFA) 108 (90 Base) MCG/ACT inhaler Inhale 1-2 puffs into the lungs every 4 (four) hours as needed for wheezing or shortness of breath. 3 month supply 24 g 3  . atenolol (TENORMIN) 50 MG tablet TAKE 1 TABLET EVERY DAY 90 tablet 3  . atorvastatin (LIPITOR) 40 MG tablet TAKE 1 TABLET EVERY DAY 90 tablet 2  . ELIQUIS 5 MG TABS tablet TAKE 1 TABLET TWICE DAILY (Patient taking differently: Take 5 mg by mouth 2 (two) times daily.) 180 tablet 1  . esomeprazole (NEXIUM) 40 MG capsule Take 1 capsule (40 mg total) by mouth 2 (two) times daily before a meal. 180 capsule 3  . ibuprofen (ADVIL,MOTRIN) 400 MG tablet Take 400 mg by mouth every 4 (four) hours as needed for moderate pain.    Marland Kitchen levothyroxine (SYNTHROID) 175 MCG tablet Take 1 tablet (175 mcg total) by mouth daily before breakfast. Take with 25 mcg to equal 200 mcg daily 90 tablet 3  . lipase/protease/amylase (CREON) 36000 UNITS CPEP capsule Take 4 capsules po during each meal. Take 1-2 capsules po during each snack.( up to 2 snacks daily). 480 capsule 3  . magic mouthwash SOLN Take 5 mLs by mouth 4 (four) times daily as needed for mouth pain. 240 mL 2  . Oxycodone HCl 10 MG TABS Take 1 tablet (10 mg total) by mouth daily as needed (pain). (Patient not taking: Reported on 04/20/2020) 30 tablet 0  . sodium chloride (OCEAN) 0.65 % SOLN nasal spray Place 1 spray into both nostrils as needed for congestion. 480 mL 0  . tamsulosin (FLOMAX) 0.4 MG CAPS capsule Take 1 capsule (0.4 mg total) by mouth daily. 90 capsule 4  . venlafaxine (EFFEXOR) 37.5 MG tablet 37.5 mg in the  morning and at bedtime.    . fentaNYL (DURAGESIC) 25 MCG/HR Place 1 patch onto the skin every 3 (three) days. 10 patch 0  . methylPREDNISolone (MEDROL DOSEPAK) 4 MG TBPK tablet Use as instructed. 21 tablet 0  . sucralfate (CARAFATE) 1 g tablet Take 1 tablet (1 g total) by mouth 4 (four) times daily -  with meals and at bedtime. 90 tablet 2   No current facility-administered medications for this visit.    Allergies:   Carboplatin, Flecainide, and Debrox [carbamide peroxide]    Social History:  The patient  reports that he quit smoking about 7 years ago. His smoking use included cigarettes.  He has a 50.00 pack-year smoking history. He has never used smokeless tobacco. He reports that he does not drink alcohol and does not use drugs.   Family History:  The patient's family history includes Breast cancer in his maternal aunt, maternal aunt, and mother.    ROS:  Please see the history of present illness.   Otherwise, review of systems are positive for fatigue.   All other systems are reviewed and negative.    PHYSICAL EXAM: VS:  BP 110/69 (BP Location: Right Arm, Patient Position: Sitting)   Pulse 83   Ht 6\' 1"  (1.854 m)   Wt 200 lb 9.6 oz (91 kg)   SpO2 93%   BMI 26.47 kg/m  , BMI Body mass index is 26.47 kg/m. GENERAL:  Chronically ill-appearing HEENT: Pupils equal round and reactive, fundi not visualized, oral mucosa unremarkable NECK:  No jugular venous distention, waveform within normal limits, carotid upstroke brisk and symmetric, no bruits, no thyromegaly LYMPHATICS:  No cervical adenopathy LUNGS:  Clear to auscultation bilaterally HEART:  Irregularly irregular.  PMI not displaced or sustained,S1 and S2 within normal limits, no S3, no S4, no clicks, no rubs, no murmurs ABD:  Flat, positive bowel sounds normal in frequency in pitch, no bruits, no rebound, no guarding, no midline pulsatile mass, no hepatomegaly, no splenomegaly EXT:  2 plus pulses throughout, no edema, no cyanosis  no clubbing SKIN:  No rashes no nodules NEURO:  Cranial nerves II through XII grossly intact, motor grossly intact throughout PSYCH:  Cognitively intact, oriented to person place and time   EKG:  EKG is ordered today. The ekg ordered 08/29/16 demonstrates atrial fibrillation. Rate 92 bpm. PVCs. Right axis deviation. Incomplete right bundle branch block. Nonspecific ST/T changes.  01/02/18: Atrial fibrillation.  Rate 95 bpm. 03/19/19: Atrial fibrillation.  Rate 104 bpm.  Incomplete RBBB.   03/18/2020: Atrial fibrillation.  Rate 83 bpm.  PVCs.  Nonspecific IVCD.  Nonspecific T wave abnormalities.   Recent Labs: 04/12/2020: ALT 14; BUN 7; Creatinine 0.73; Hemoglobin 11.9; Platelet Count 262; Potassium 4.1; Sodium 138; TSH 12.739    Lipid Panel    Component Value Date/Time   CHOL 122 03/06/2019 0956   CHOL 115 12/19/2016 0933   TRIG 164.0 (H) 09/10/2019 1640   HDL 35.90 (L) 03/06/2019 0956   HDL 33 (L) 12/19/2016 0933   CHOLHDL 3 03/06/2019 0956   VLDL 40.6 (H) 03/06/2019 0956   LDLCALC 34 12/19/2016 0933   LDLDIRECT 55.0 03/06/2019 0956      Wt Readings from Last 3 Encounters:  04/20/20 195 lb (88.5 kg)  04/15/20 197 lb 12.8 oz (89.7 kg)  04/14/20 195 lb 11.2 oz (88.8 kg)      ASSESSMENT AND PLAN:  # Longstanding persistent atrial fibrillation: # Fatigue: Mr. Dilone continues to have fatigue and shortness of breath.  It seems that it is mostly related to his lung disease, though atrial fibrillation can certainly contribute.  His heart rate was initially elevated when walking but was <100 bpm at rest.  He became very dyspneic just with getting on and off the exam table.  He is not a candidate for ablation due to long duration in atrial fibrillation.  He is not a candidate for amiodarone given his lung disease and hypothyroidism.  We again discussed dofetilide but he declined due to cost and the need for hospitalization.  Continue atenolol and Eliquis.  # Shortness of breath:   Mr. Cohron has a lot of exertional dyspnea.  It  is unclear how much of this is due to deconditioning or atrial fibrillation.  However he does have risk factors for CAD.  We will get a Lexiscan Myoview to assess for ischemia.  He is unable to walk on a treadmill.  # Hyperlipidemia: Continue atorvastatin.  LDL 55 03/2019.    Current medicines are reviewed at length with the patient today.  The patient does not have concerns regarding medicines.  The following changes have been made:  no change  Labs/ tests ordered today include:   Orders Placed This Encounter  Procedures  . Cardiac Stress Test: Informed Consent Details: Physician/Practitioner Attestation; Transcribe to consent form and obtain patient signature  . MYOCARDIAL PERFUSION IMAGING  . EKG 12-Lead     Disposition:   FU with Yanice Maqueda C. Oval Linsey, MD, [facility] in 1 year.    Signed, Milayah Krell C. Oval Linsey, MD, [facility]  04/21/2020 1:54 PM    [facility]

## 2020-03-21 ENCOUNTER — Encounter: Payer: Self-pay | Admitting: Gastroenterology

## 2020-03-23 ENCOUNTER — Telehealth (HOSPITAL_COMMUNITY): Payer: Self-pay | Admitting: *Deleted

## 2020-03-23 NOTE — Telephone Encounter (Signed)
Close encounter 

## 2020-03-30 ENCOUNTER — Ambulatory Visit (HOSPITAL_COMMUNITY)
Admission: RE | Admit: 2020-03-30 | Discharge: 2020-03-30 | Disposition: A | Discharge status: Home / Self Care | Payer: Medicare HMO | Source: Ambulatory Visit | Attending: Cardiovascular Disease | Admitting: Cardiovascular Disease

## 2020-03-30 ENCOUNTER — Telehealth: Payer: Self-pay | Admitting: Gastroenterology

## 2020-03-30 ENCOUNTER — Other Ambulatory Visit: Payer: Self-pay

## 2020-03-30 DIAGNOSIS — R0602 Shortness of breath: Secondary | ICD-10-CM | POA: Diagnosis not present

## 2020-03-30 DIAGNOSIS — I4819 Other persistent atrial fibrillation: Secondary | ICD-10-CM | POA: Insufficient documentation

## 2020-03-30 MED ORDER — TECHNETIUM TC 99M TETROFOSMIN IV KIT
9.8000 | PACK | Freq: Once | INTRAVENOUS | Status: AC | PRN
  Administered 2020-03-30: 9.8 via INTRAVENOUS
  Filled 2020-03-30: qty 10

## 2020-03-30 MED ORDER — SUCRALFATE 1 G PO TABS: 1.0000 g | ORAL_TABLET | Freq: Three times a day (TID) | ORAL | 2 refills | Status: DC

## 2020-03-30 MED ORDER — TECHNETIUM TC 99M TETROFOSMIN IV KIT
29.7000 | PACK | Freq: Once | INTRAVENOUS | Status: AC | PRN
  Administered 2020-03-30: 29.7 via INTRAVENOUS
  Filled 2020-03-30: qty 30

## 2020-03-30 MED ORDER — REGADENOSON 0.4 MG/5ML IV SOLN
0.4000 mg | Freq: Once | INTRAVENOUS | Status: AC
  Administered 2020-03-30: 0.4 mg via INTRAVENOUS

## 2020-03-30 NOTE — Telephone Encounter (Signed)
Rx for tablets sent to [facility].

## 2020-03-31 LAB — MYOCARDIAL PERFUSION IMAGING
Peak HR: 93 {beats}/min
Rest HR: 68 {beats}/min
SDS: 3
SRS: 4
SSS: 7
TID: 1.05

## 2020-04-04 ENCOUNTER — Ambulatory Visit: Payer: Medicare HMO

## 2020-04-04 ENCOUNTER — Other Ambulatory Visit: Payer: Medicare HMO

## 2020-04-04 ENCOUNTER — Ambulatory Visit: Payer: Medicare HMO | Admitting: Internal Medicine

## 2020-04-05 ENCOUNTER — Telehealth: Payer: Self-pay | Admitting: Gastroenterology

## 2020-04-05 NOTE — Telephone Encounter (Signed)
He does not need an office visit. He can be scheduled for anytime after 04/18/20 in the [facility].  Thank you

## 2020-04-05 NOTE — Telephone Encounter (Signed)
Pt's wife called to scheduled recall EGD. Pt is on Eliquis and recently had an EGD with Dr. Rush Landmark on 03/11/20. Does he need to come in for an ov or can he be scheduled directly for repeat EGD? pls advise Thank you.

## 2020-04-06 ENCOUNTER — Encounter: Payer: Self-pay | Admitting: Gastroenterology

## 2020-04-06 NOTE — Telephone Encounter (Signed)
Fyi, recall EGD scheduled on 05/12/20 at 2:30pm at [facility].

## 2020-04-07 DIAGNOSIS — H25043 Posterior subcapsular polar age-related cataract, bilateral: Secondary | ICD-10-CM | POA: Diagnosis not present

## 2020-04-07 DIAGNOSIS — H5203 Hypermetropia, bilateral: Secondary | ICD-10-CM | POA: Diagnosis not present

## 2020-04-07 DIAGNOSIS — H524 Presbyopia: Secondary | ICD-10-CM | POA: Diagnosis not present

## 2020-04-07 DIAGNOSIS — H02884 Meibomian gland dysfunction left upper eyelid: Secondary | ICD-10-CM | POA: Diagnosis not present

## 2020-04-07 DIAGNOSIS — H52223 Regular astigmatism, bilateral: Secondary | ICD-10-CM | POA: Diagnosis not present

## 2020-04-07 DIAGNOSIS — H2512 Age-related nuclear cataract, left eye: Secondary | ICD-10-CM | POA: Diagnosis not present

## 2020-04-12 ENCOUNTER — Inpatient Hospital Stay: Payer: Medicare HMO | Attending: Internal Medicine

## 2020-04-12 ENCOUNTER — Other Ambulatory Visit: Payer: Medicare HMO

## 2020-04-12 ENCOUNTER — Other Ambulatory Visit: Payer: Self-pay

## 2020-04-12 ENCOUNTER — Encounter (HOSPITAL_COMMUNITY): Payer: Self-pay

## 2020-04-12 ENCOUNTER — Ambulatory Visit (HOSPITAL_COMMUNITY)
Admission: RE | Admit: 2020-04-12 | Discharge: 2020-04-12 | Disposition: A | Discharge status: Home / Self Care | Payer: Medicare HMO | Source: Ambulatory Visit | Attending: Internal Medicine | Admitting: Internal Medicine

## 2020-04-12 DIAGNOSIS — I4891 Unspecified atrial fibrillation: Secondary | ICD-10-CM | POA: Insufficient documentation

## 2020-04-12 DIAGNOSIS — J90 Pleural effusion, not elsewhere classified: Secondary | ICD-10-CM | POA: Diagnosis not present

## 2020-04-12 DIAGNOSIS — K8020 Calculus of gallbladder without cholecystitis without obstruction: Secondary | ICD-10-CM | POA: Diagnosis not present

## 2020-04-12 DIAGNOSIS — Z7901 Long term (current) use of anticoagulants: Secondary | ICD-10-CM | POA: Insufficient documentation

## 2020-04-12 DIAGNOSIS — E039 Hypothyroidism, unspecified: Secondary | ICD-10-CM | POA: Diagnosis not present

## 2020-04-12 DIAGNOSIS — C3432 Malignant neoplasm of lower lobe, left bronchus or lung: Secondary | ICD-10-CM | POA: Diagnosis not present

## 2020-04-12 DIAGNOSIS — Z9221 Personal history of antineoplastic chemotherapy: Secondary | ICD-10-CM | POA: Insufficient documentation

## 2020-04-12 DIAGNOSIS — I2510 Atherosclerotic heart disease of native coronary artery without angina pectoris: Secondary | ICD-10-CM | POA: Diagnosis not present

## 2020-04-12 DIAGNOSIS — Z79899 Other long term (current) drug therapy: Secondary | ICD-10-CM | POA: Diagnosis not present

## 2020-04-12 DIAGNOSIS — C3490 Malignant neoplasm of unspecified part of unspecified bronchus or lung: Secondary | ICD-10-CM | POA: Diagnosis not present

## 2020-04-12 DIAGNOSIS — C7951 Secondary malignant neoplasm of bone: Secondary | ICD-10-CM | POA: Insufficient documentation

## 2020-04-12 DIAGNOSIS — E278 Other specified disorders of adrenal gland: Secondary | ICD-10-CM | POA: Diagnosis not present

## 2020-04-12 DIAGNOSIS — J439 Emphysema, unspecified: Secondary | ICD-10-CM | POA: Diagnosis not present

## 2020-04-12 DIAGNOSIS — J700 Acute pulmonary manifestations due to radiation: Secondary | ICD-10-CM | POA: Diagnosis not present

## 2020-04-12 DIAGNOSIS — N281 Cyst of kidney, acquired: Secondary | ICD-10-CM | POA: Diagnosis not present

## 2020-04-12 DIAGNOSIS — K7689 Other specified diseases of liver: Secondary | ICD-10-CM | POA: Diagnosis not present

## 2020-04-12 LAB — CMP (CANCER CENTER ONLY)
ALT: 14 U/L (ref 0–44)
AST: 13 U/L — ABNORMAL LOW (ref 15–41)
Albumin: 2.7 g/dL — ABNORMAL LOW (ref 3.5–5.0)
Alkaline Phosphatase: 123 U/L (ref 38–126)
Anion gap: 7 (ref 5–15)
BUN: 7 mg/dL — ABNORMAL LOW (ref 8–23)
CO2: 28 mmol/L (ref 22–32)
Calcium: 8.7 mg/dL — ABNORMAL LOW (ref 8.9–10.3)
Chloride: 103 mmol/L (ref 98–111)
Creatinine: 0.73 mg/dL (ref 0.61–1.24)
GFR, Estimated: >60 mL/min (ref >60)
Glucose, Bld: 132 mg/dL — ABNORMAL HIGH (ref 70–99)
Potassium: 4.1 mmol/L (ref 3.5–5.1)
Sodium: 138 mmol/L (ref 135–145)
Total Bilirubin: 0.6 mg/dL (ref 0.3–1.2)
Total Protein: 6 g/dL — ABNORMAL LOW (ref 6.5–8.1)

## 2020-04-12 LAB — CBC WITH DIFFERENTIAL (CANCER CENTER ONLY)
Abs Immature Granulocytes: 0.03 10*3/uL (ref 0.00–0.07)
Basophils Absolute: 0 10*3/uL (ref 0.0–0.1)
Basophils Relative: 0 %
Eosinophils Absolute: 0.1 10*3/uL (ref 0.0–0.5)
Eosinophils Relative: 1 %
HCT: 35.4 % — ABNORMAL LOW (ref 39.0–52.0)
Hemoglobin: 11.9 g/dL — ABNORMAL LOW (ref 13.0–17.0)
Immature Granulocytes: 0 %
Lymphocytes Relative: 15 %
Lymphs Abs: 1.5 10*3/uL (ref 0.7–4.0)
MCH: 31.9 pg (ref 26.0–34.0)
MCHC: 33.6 g/dL (ref 30.0–36.0)
MCV: 94.9 fL (ref 80.0–100.0)
Monocytes Absolute: 0.6 10*3/uL (ref 0.1–1.0)
Monocytes Relative: 6 %
Neutro Abs: 7.5 10*3/uL (ref 1.7–7.7)
Neutrophils Relative %: 78 %
Platelet Count: 262 10*3/uL (ref 150–400)
RBC: 3.73 MIL/uL — ABNORMAL LOW (ref 4.22–5.81)
RDW: 13.9 % (ref 11.5–15.5)
WBC Count: 9.7 10*3/uL (ref 4.0–10.5)
nRBC: 0 % (ref 0.0–0.2)

## 2020-04-12 LAB — TSH: TSH: 12.739 u[IU]/mL — ABNORMAL HIGH (ref 0.320–4.118)

## 2020-04-12 MED ORDER — IOHEXOL 300 MG/ML  SOLN
100.0000 mL | Freq: Once | INTRAMUSCULAR | Status: AC | PRN
  Administered 2020-04-12: 100 mL via INTRAVENOUS

## 2020-04-12 MED ORDER — IOHEXOL 9 MG/ML PO SOLN
ORAL | Status: AC
  Administered 2020-04-12: 500 mL via ORAL
  Filled 2020-04-12: qty 1000

## 2020-04-12 MED ORDER — IOHEXOL 9 MG/ML PO SOLN
500.0000 mL | ORAL | Status: AC
Start: 2020-04-12 — End: 2020-04-12
  Administered 2020-04-12: 500 mL via ORAL

## 2020-04-13 ENCOUNTER — Telehealth: Payer: Self-pay | Admitting: *Deleted

## 2020-04-13 ENCOUNTER — Other Ambulatory Visit: Payer: Self-pay

## 2020-04-13 DIAGNOSIS — K209 Esophagitis, unspecified without bleeding: Secondary | ICD-10-CM

## 2020-04-13 NOTE — Telephone Encounter (Signed)
I am fine with the 1-2 day hold depending on his prior clearance. Thank you. GM

## 2020-04-13 NOTE — Telephone Encounter (Signed)
Understood.  Would like to minimize potential problems as well. Lets go ahead and get him scheduled for an endoscopy in the hospital-based setting due to concern from an anesthesia perspective and with his progressive dyspnea. If the patient needs a referral to pulmonary to be evaluated further since cardiology does not feel that this is cardiac in origin then we certainly can put that in as well. Patty, please let the patient know that we will need to change his location for his endoscopy. Normal endoscopy time slot in the hospital is perfectly fine.  We should find a slot in February. Thanks. GM

## 2020-04-13 NOTE — Telephone Encounter (Signed)
Cyril Mourning,  It seems his increasing SOB is related to his pulmonary dx.  Unfortunately I do not see any recent PFT's. His procedure should be done in the hospital setting since his SOB might not allow [place] to d/c him post procedure.  Thanks,  Osvaldo Angst

## 2020-04-13 NOTE — Telephone Encounter (Signed)
Dr. Rush Landmark, This pt is coming in for a PV on 04-20-20; his repeat EGD is 05-12-20.  He is on Eliquis- is it ok to use the clearance received on 01-28-20 for his procedure- to hold Eliquis x2 days prior to procedure?  Please advise  Thanks, Cyril Mourning

## 2020-04-13 NOTE — Telephone Encounter (Signed)
Dr. Rush Landmark and Jenny Reichmann,  I also noted that he saw his cardiologist on 03-18-20 for SOB.  I know he does have a hx of small cell lung CA and dyspnea, but pt told cardiologist that SOB is getting progressively worse and he gets SOB with minimal movement.  Cardiologist noted that he became very dyspneic just getting on and off exam table.  Is he ok to proceed at the [facility]?  Please advise.  Thanks, [employer]

## 2020-04-13 NOTE — Telephone Encounter (Signed)
Dr. Rush Landmark,  Would you like to hold Eliquis for 2 days like we did the last procedure?  Thanks, [employer]

## 2020-04-13 NOTE — Telephone Encounter (Signed)
Case moved to [facility] on 05/23/20 at 930 am.  Prevsit still scheduled.  COVID 2/17 1020 am

## 2020-04-13 NOTE — Telephone Encounter (Signed)
The pt has been advised of the change in appt date and location.  He will be expecting a call from previsit on 1/19.

## 2020-04-14 ENCOUNTER — Encounter: Payer: Self-pay | Admitting: Internal Medicine

## 2020-04-14 ENCOUNTER — Inpatient Hospital Stay (HOSPITAL_BASED_OUTPATIENT_CLINIC_OR_DEPARTMENT_OTHER): Payer: Medicare HMO | Admitting: Internal Medicine

## 2020-04-14 ENCOUNTER — Other Ambulatory Visit: Payer: Self-pay

## 2020-04-14 VITALS — BP 104/70 | HR 77 | Temp 98.3°F | Resp 17 | Ht 73.0 in | Wt 195.7 lb

## 2020-04-14 DIAGNOSIS — E039 Hypothyroidism, unspecified: Secondary | ICD-10-CM | POA: Diagnosis not present

## 2020-04-14 DIAGNOSIS — C3432 Malignant neoplasm of lower lobe, left bronchus or lung: Secondary | ICD-10-CM | POA: Diagnosis not present

## 2020-04-14 DIAGNOSIS — C7951 Secondary malignant neoplasm of bone: Secondary | ICD-10-CM

## 2020-04-14 DIAGNOSIS — I4891 Unspecified atrial fibrillation: Secondary | ICD-10-CM | POA: Diagnosis not present

## 2020-04-14 DIAGNOSIS — C3490 Malignant neoplasm of unspecified part of unspecified bronchus or lung: Secondary | ICD-10-CM

## 2020-04-14 DIAGNOSIS — C3492 Malignant neoplasm of unspecified part of left bronchus or lung: Secondary | ICD-10-CM | POA: Diagnosis not present

## 2020-04-14 DIAGNOSIS — Z5112 Encounter for antineoplastic immunotherapy: Secondary | ICD-10-CM

## 2020-04-14 DIAGNOSIS — Z7901 Long term (current) use of anticoagulants: Secondary | ICD-10-CM | POA: Diagnosis not present

## 2020-04-14 DIAGNOSIS — Z79899 Other long term (current) drug therapy: Secondary | ICD-10-CM | POA: Diagnosis not present

## 2020-04-14 DIAGNOSIS — Z9221 Personal history of antineoplastic chemotherapy: Secondary | ICD-10-CM | POA: Diagnosis not present

## 2020-04-14 MED ORDER — FENTANYL 25 MCG/HR TD PT72: 1.0000 | MEDICATED_PATCH | TRANSDERMAL | 0 refills | Status: DC

## 2020-04-14 MED ORDER — METHYLPREDNISOLONE 4 MG PO TBPK: ORAL_TABLET | ORAL | 0 refills | Status: DC

## 2020-04-14 NOTE — Progress Notes (Signed)
[place] Telephone:([phone]) [phone]   Fax:(336) [phone]  OFFICE PROGRESS NOTE  [name], MD [address] [place] [address]  DIAGNOSIS: Metastatic non-small cell lung cancer initially diagnosed as stage IIIA (T2a, N2, M0) non-small cell lung cancer, poorly differentiated adenocarcinoma presented with left lower lobe lung mass in addition to mediastinal lymphadenopathy.  The patient was diagnosed with metastatic disease involving the left femur as well as left supraclavicular nodal metastases and right paratracheal lymphadenopathy in October 2018.  Biomarker Findings Microsatellite Status - MS-Stable Tumor Mutational Burden - TMB-Low (3 Muts/Mb) Genomic Findings For a complete list of the genes assayed, please refer to the Appendix. STK11 P241f*6 CNOMV6HpM09OBSsplice site [id number]+[id number]>MDAXX E374* MLL2 [id number] NBN [id number] NOTCH2 [id number] splice site [id number]>PDisease relevant genes with no reportable alterations: EGFR, KRAS, ALK, BRAF, MET, RET, ERBB2, ROS1   PDL1 expression 5%  PRIOR THERAPY: 1) status post wedge resection of the left lower lobe lung mass as well as AP window lymph node dissection but there was residual metastatic mediastinal lymphadenopathy that could not be resected. 2) a course of concurrent chemoradiation [place] carboplatin [place]  3) status post palliative radiotherapy to the left femur metastatic bone disease. 4)  Systemic chemotherapy with carboplatin for AUC of 5, Alimta 500 mg/M2 and Keytruda 200 mg IV every 3 weeks.  First dose March 07, 2017.  Carboplatin was discontinued during cycle #2 secondary to hypersensitivity reaction. 5) status post 2 cycles of maintenance treatment with Alimta and Ketruda (pembrolizumab).  Alimta was discontinued secondary to intolerance.  CURRENT THERAPY: Maintenance treatment with single agent Ketruda (pembrolizumab) status post 49   cycles.  INTERVAL HISTORY: Victor Huber 70o. male returns to the clinic today for follow-up visit accompanied by his wife.  The patient continues to complain of increasing fatigue and weakness as well as pain in the knees and ankles.  He was seen by his orthopedic surgeon and was giving steroid injection that gave him relief for few days but his symptoms recurred again.  Scheduled to see his primary care provider.  He is currently on oxycodone with some relief but he also alternate it with ibuprofen.  He denied having any current chest pain, shortness of breath but continues to have cough.  He has no nausea, vomiting, diarrhea or constipation.  He tolerated the last cycle of his treatment with Keytruda fairly well.  The patient is here today for evaluation with repeat CT scan of the chest, abdomen and pelvis for restaging of his disease.  MEDICAL HISTORY: Past Medical History:  Diagnosis Date  . Adenocarcinoma of left lung, stage 3 ([facility]/16/2018  . Allergy   . Anxiety   . Arthritis   . Atrial fibrillation ([facility]/16/2018  . Atrial fibrillation ([facility] . Cancer, metastatic to bone ([facility] . COPD (chronic obstructive pulmonary disease) ([facility]/16/2018  . Depression   . Dysphagia   . Dysrhythmia    a-fib  . GERD (gastroesophageal reflux disease)   . History of chemotherapy   . History of radiation therapy   . Hyperlipidemia   . Hypothyroid 08/15/2016  . Longstanding persistent atrial fibrillation ([facility]/30/2018  . Pathologic fracture    left femur  . Pneumonitis   . S/P TURP 08/15/2016  . Wears glasses   . Wears hearing aid in both ears     ALLERGIES:  is allergic to carboplatin, flecainide, and  debrox [carbamide peroxide].  MEDICATIONS:  Current Outpatient Medications  Medication Sig Dispense Refill  . albuterol (VENTOLIN HFA) 108 (90 Base) MCG/ACT inhaler Inhale 1-2 puffs into the lungs every 4 (four) hours as needed for wheezing or shortness of breath. 3 month supply  24 g 3  . atenolol (TENORMIN) 50 MG tablet TAKE 1 TABLET EVERY DAY 90 tablet 3  . atorvastatin (LIPITOR) 40 MG tablet TAKE 1 TABLET EVERY DAY 90 tablet 2  . ELIQUIS 5 MG TABS tablet TAKE 1 TABLET TWICE DAILY (Patient taking differently: Take 5 mg by mouth 2 (two) times daily.) 180 tablet 1  . esomeprazole (NEXIUM) 40 MG capsule Take 1 capsule (40 mg total) by mouth 2 (two) times daily before a meal. 180 capsule 3  . ibuprofen (ADVIL,MOTRIN) 400 MG tablet Take 400 mg by mouth every 4 (four) hours as needed for moderate pain.    Marland Kitchen levothyroxine (SYNTHROID) 175 MCG tablet Take 1 tablet (175 mcg total) by mouth daily before breakfast. Take with 25 mcg to equal 200 mcg daily 90 tablet 3  . lipase/protease/amylase (CREON) 36000 UNITS CPEP capsule Take 4 capsules po during each meal. Take 1-2 capsules po during each snack.( up to 2 snacks daily). 480 capsule 3  . LOTEMAX SM 0.38 % GEL Pt takes 4 drops daily    . magic mouthwash SOLN Take 5 mLs by mouth 4 (four) times daily as needed for mouth pain. 240 mL 2  . Oxycodone HCl 10 MG TABS Take 1 tablet (10 mg total) by mouth daily as needed (pain). 30 tablet 0  . sodium chloride (OCEAN) 0.65 % SOLN nasal spray Place 1 spray into both nostrils as needed for congestion. 480 mL 0  . sucralfate (CARAFATE) 1 g tablet Take 1 tablet (1 g total) by mouth 4 (four) times daily -  with meals and at bedtime. 90 tablet 2  . tamsulosin (FLOMAX) 0.4 MG CAPS capsule Take 1 capsule (0.4 mg total) by mouth daily. 90 capsule 4  . venlafaxine (EFFEXOR) 37.5 MG tablet 37.5 mg in the morning and at bedtime.     No current facility-administered medications for this visit.    SURGICAL HISTORY:  Past Surgical History:  Procedure Laterality Date  . BIOPSY  12/21/2019   Procedure: BIOPSY;  Surgeon: Rush Landmark Telford Nab., MD;  Location: [facility];  Service: Gastroenterology;;  . BRONCHOSCOPY  10/2014  . CARDIAC CATHETERIZATION     05/07/12  . CARDIOVERSION     x2  .  COLONOSCOPY    . DG BIOPSY LUNG Left 10/2014   FNA - Adenocarcinoma   . ESOPHAGOGASTRODUODENOSCOPY (EGD) WITH PROPOFOL N/A 12/21/2019   Procedure: ESOPHAGOGASTRODUODENOSCOPY (EGD) WITH PROPOFOL;  Surgeon: Rush Landmark Telford Nab., MD;  Location: [facility];  Service: Gastroenterology;  Laterality: N/A;  . EUS N/A 12/21/2019   Procedure: UPPER ENDOSCOPIC ULTRASOUND (EUS) RADIAL;  Surgeon: Rush Landmark Telford Nab., MD;  Location: [facility];  Service: Gastroenterology;  Laterality: N/A;  . FEMUR IM NAIL Left 02/19/2017  . FEMUR IM NAIL Left 02/19/2017   Procedure: INTRAMEDULLARY (IM) NAIL FEMORAL;  Surgeon: Marchia Bond, MD;  Location: [facility];  Service: Orthopedics;  Laterality: Left;  . IR FLUORO GUIDE PORT INSERTION RIGHT  07/03/2017  . IR US GUIDE VASC ACCESS RIGHT  07/03/2017  . LUNG CANCER SURGERY Left 12/2014   Wedge Resection   . MULTIPLE TOOTH EXTRACTIONS    . Status post TURP    . TONSILLECTOMY      REVIEW OF SYSTEMS:  Constitutional: positive for anorexia, fatigue and weight loss Eyes: negative Ears, nose, mouth, throat, and face: negative Respiratory: positive for cough Cardiovascular: negative Gastrointestinal: negative Genitourinary:negative Integument/breast: negative Hematologic/lymphatic: negative Musculoskeletal:positive for arthralgias and bone pain Neurological: negative Behavioral/Psych: negative Endocrine: negative Allergic/Immunologic: negative   PHYSICAL EXAMINATION: General appearance: alert, cooperative, fatigued and no distress Head: Normocephalic, without obvious abnormality, atraumatic Neck: no adenopathy, no JVD, supple, symmetrical, trachea midline and thyroid not enlarged, symmetric, no tenderness/mass/nodules Lymph nodes: Cervical, supraclavicular, and axillary nodes normal. Resp: clear to auscultation bilaterally Back: symmetric, no curvature. ROM normal. No CVA tenderness. Cardio: regular rate and rhythm, S1, S2 normal, no murmur, click, rub or  gallop GI: soft, non-tender; bowel sounds normal; no masses,  no organomegaly Extremities: extremities normal, atraumatic, no cyanosis or edema Neurologic: Alert and oriented X 3, normal strength and tone. Normal symmetric reflexes. Normal coordination and gait  ECOG PERFORMANCE STATUS: 1 - Symptomatic but completely ambulatory  Blood pressure 104/70, pulse 77, temperature 98.3 F (36.8 C), temperature source Tympanic, resp. rate 17, height 6' 1"  (1.854 m), weight 195 lb 11.2 oz (88.8 kg), SpO2 97 %.  LABORATORY DATA: Lab Results  Component Value Date   WBC 9.7 04/12/2020   HGB 11.9 (L) 04/12/2020   HCT 35.4 (L) 04/12/2020   MCV 94.9 04/12/2020   PLT 262 04/12/2020      Chemistry      Component Value Date/Time   NA 138 04/12/2020 1011   NA 136 04/04/2017 1141   K 4.1 04/12/2020 1011   K 4.4 04/04/2017 1141   CL 103 04/12/2020 1011   CO2 28 04/12/2020 1011   CO2 24 04/04/2017 1141   BUN 7 (L) 04/12/2020 1011   BUN 21.1 04/04/2017 1141   CREATININE 0.73 04/12/2020 1011   CREATININE 1.1 04/04/2017 1141      Component Value Date/Time   CALCIUM 8.7 (L) 04/12/2020 1011   CALCIUM 9.1 04/04/2017 1141   ALKPHOS 123 04/12/2020 1011   ALKPHOS 89 04/04/2017 1141   AST 13 (L) 04/12/2020 1011   AST 17 04/04/2017 1141   ALT 14 04/12/2020 1011   ALT 18 04/04/2017 1141   BILITOT 0.6 04/12/2020 1011   BILITOT 0.67 04/04/2017 1141       RADIOGRAPHIC STUDIES: CT Chest W Contrast  Result Date: 04/12/2020 CLINICAL DATA:  left lower lobectomy. Immunotherapy for non-small cell lung cancer. EXAM: CT CHEST, ABDOMEN, AND PELVIS WITH CONTRAST TECHNIQUE: Multidetector CT imaging of the chest, abdomen and pelvis was performed following the standard protocol during bolus administration of intravenous contrast. CONTRAST:  161m OMNIPAQUE IOHEXOL 300 MG/ML  SOLN COMPARISON:  01/29/2020 FINDINGS: CT CHEST FINDINGS Cardiovascular: Right Port-A-Cath tip high right atrium. Aortic atherosclerosis.  Mild cardiomegaly with resolved pericardial effusion. Right atrial enlargement. Multivessel coronary artery atherosclerosis. No central pulmonary embolism, on this non-dedicated study. Mediastinum/Nodes: No supraclavicular adenopathy. 11 mm right paratracheal node on 23/2 is decreased from 13 mm on the prior. Low right paratracheal 1.8 cm node on 29/2 is similar to on the prior exam (when remeasured). Subcarinal node measures 1.7 cm on 37/2 versus 1.8 cm on the prior. No hilar adenopathy. Lungs/Pleura: Small left pleural effusion is minimally increased. Advanced bullous emphysema. Left lower lobe wedge resection. Soft tissue thickening surrounding surgical sutures in the infrahilar left lower lobe, similar. No convincing evidence of locally recurrent disease. Similar bibasilar septal thickening and ground-glass is subpleural in distribution, nonspecific. Medial left lower lobe 8 mm nodule on 118/4, new. Musculoskeletal: No acute osseous abnormality. CT  ABDOMEN PELVIS FINDINGS Hepatobiliary: Subcentimeter high left hepatic lobe cyst. No suspicious liver lesion. Multiple gallstones on the order of 5 mm. No acute cholecystitis or biliary duct dilatation. Pancreas: Moderate pancreatic atrophy. No duct dilatation or acute inflammation. Spleen: Normal in size, without focal abnormality. Adrenals/Urinary Tract: Normal left adrenal gland. Minimal right adrenal nodularity is similar. Right renal too small to characterize lesions. Left renal cysts. Normal urinary bladder. Stomach/Bowel: Normal stomach, without wall thickening. Colonic stool burden suggests constipation. Normal small bowel. Vascular/Lymphatic: Aortic atherosclerosis. No abdominopelvic adenopathy. Reproductive: Mild prostatomegaly. Other: Trace right cul-de-sac fluid on 112/2, new. No free intraperitoneal air. Tiny bilateral fat containing inguinal hernias. Right paramidline ventral abdominal wall hernia contains only fat. Musculoskeletal: Left proximal femur  fixation lumbosacral spondylosis. IMPRESSION: 1. Similar appearance of surgical changes of left lower lobe wedge resection. Surrounding similar radiation induced fibrosis. 2. New 8 mm more inferior and medial left lower lobe pulmonary nodule. Cannot exclude isolated metastatic disease or metachronous primary. Consider chest CT follow-up at 3-6 months. This is at the low end of PET resolution and likely too small to biopsy. 3. Similar borderline thoracic adenopathy. 4. No evidence of abdominopelvic metastatic disease. 5. Increase in small left pleural effusion. 6. Cholelithiasis. 7.  Possible constipation. 8. Aortic atherosclerosis (ICD10-I70.0), coronary artery atherosclerosis and emphysema (ICD10-J43.9). 9. New trace pelvic fluid, of indeterminate etiology. Electronically Signed   By: Abigail Miyamoto M.D.   On: 04/12/2020 14:51   CT Abdomen Pelvis W Contrast  Result Date: 04/12/2020 CLINICAL DATA:  left lower lobectomy. Immunotherapy for non-small cell lung cancer. EXAM: CT CHEST, ABDOMEN, AND PELVIS WITH CONTRAST TECHNIQUE: Multidetector CT imaging of the chest, abdomen and pelvis was performed following the standard protocol during bolus administration of intravenous contrast. CONTRAST:  126m OMNIPAQUE IOHEXOL 300 MG/ML  SOLN COMPARISON:  01/29/2020 FINDINGS: CT CHEST FINDINGS Cardiovascular: Right Port-A-Cath tip high right atrium. Aortic atherosclerosis. Mild cardiomegaly with resolved pericardial effusion. Right atrial enlargement. Multivessel coronary artery atherosclerosis. No central pulmonary embolism, on this non-dedicated study. Mediastinum/Nodes: No supraclavicular adenopathy. 11 mm right paratracheal node on 23/2 is decreased from 13 mm on the prior. Low right paratracheal 1.8 cm node on 29/2 is similar to on the prior exam (when remeasured). Subcarinal node measures 1.7 cm on 37/2 versus 1.8 cm on the prior. No hilar adenopathy. Lungs/Pleura: Small left pleural effusion is minimally increased.  Advanced bullous emphysema. Left lower lobe wedge resection. Soft tissue thickening surrounding surgical sutures in the infrahilar left lower lobe, similar. No convincing evidence of locally recurrent disease. Similar bibasilar septal thickening and ground-glass is subpleural in distribution, nonspecific. Medial left lower lobe 8 mm nodule on 118/4, new. Musculoskeletal: No acute osseous abnormality. CT ABDOMEN PELVIS FINDINGS Hepatobiliary: Subcentimeter high left hepatic lobe cyst. No suspicious liver lesion. Multiple gallstones on the order of 5 mm. No acute cholecystitis or biliary duct dilatation. Pancreas: Moderate pancreatic atrophy. No duct dilatation or acute inflammation. Spleen: Normal in size, without focal abnormality. Adrenals/Urinary Tract: Normal left adrenal gland. Minimal right adrenal nodularity is similar. Right renal too small to characterize lesions. Left renal cysts. Normal urinary bladder. Stomach/Bowel: Normal stomach, without wall thickening. Colonic stool burden suggests constipation. Normal small bowel. Vascular/Lymphatic: Aortic atherosclerosis. No abdominopelvic adenopathy. Reproductive: Mild prostatomegaly. Other: Trace right cul-de-sac fluid on 112/2, new. No free intraperitoneal air. Tiny bilateral fat containing inguinal hernias. Right paramidline ventral abdominal wall hernia contains only fat. Musculoskeletal: Left proximal femur fixation lumbosacral spondylosis. IMPRESSION: 1. Similar appearance of surgical changes of left lower  lobe wedge resection. Surrounding similar radiation induced fibrosis. 2. New 8 mm more inferior and medial left lower lobe pulmonary nodule. Cannot exclude isolated metastatic disease or metachronous primary. Consider chest CT follow-up at 3-6 months. This is at the low end of PET resolution and likely too small to biopsy. 3. Similar borderline thoracic adenopathy. 4. No evidence of abdominopelvic metastatic disease. 5. Increase in small left pleural  effusion. 6. Cholelithiasis. 7.  Possible constipation. 8. Aortic atherosclerosis (ICD10-I70.0), coronary artery atherosclerosis and emphysema (ICD10-J43.9). 9. New trace pelvic fluid, of indeterminate etiology. Electronically Signed   By: Abigail Miyamoto M.D.   On: 04/12/2020 14:51   MYOCARDIAL PERFUSION IMAGING  Result Date: 03/31/2020  Defect 1: There is a small defect of mild severity present in the basal inferolateral and mid inferolateral location that improves with stress suggestive of artifact.  Defect 2: There is a small defect of mild severity present in the apical inferior and apex location in rest and stress- infarct is on differential.  Defect 3: There is a small defect of mild severity present in the basal inferoseptal and basal inferior location in rest and stress- infarct is on differential.  This is an intermediate risk study given multiple small defects.  Findings consistent with prior myocardial infarction or sub diaphragmatic attenuation (unable to assess gating of walls in the setting of atrial fibrillation).  Possible inferior infarct.  Intermediate risk study.    ASSESSMENT AND PLAN: This is a 70 years old white male with metastatic non-small cell lung cancer, adenocarcinoma with no actionable mutations and PDL 1 expression of 5% that was initially diagnosed as stage IIIa non-small cell lung cancer, adenocarcinoma status post left lower lobectomy with lymph node dissection followed by a course of concurrent chemoradiation completed in January 2016.  The patient had evidence for disease metastasis in October 2018 with metastatic disease to the left femur as well as left supraclavicular and right paratracheal lymph nodes. The patient is currently on systemic chemotherapy initially was with carboplatin, Alimta and Keytruda.  Carboplatin was discontinued secondary to hypersensitivity reaction starting from cycle #2.   He was also treated with 3 cycles of maintenance Alimta and Ketruda  (pembrolizumab) but Alimta was discontinued secondary to intolerance. He is currently undergoing treatment with maintenance Keytruda as a single agent status post 49 cycles.  The patient has been tolerating his treatment fairly well with no concerning complaints but the patient continues to have a lot of issues with his arthritis especially in the knees and ankles.  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 He had repeat CT scan of the chest, abdomen pelvis performed recently.  I personally and independently reviewed the scans and discussed the results with the patient and his wife. His scan showed no concerning findings for disease progression but there was new 8 mm nodule in the left lung that need close monitoring on the upcoming imaging studies.  I personally and independently reviewed the scan images and showed the images to the patient and his wife. I recommended for him to hold his treatment with immunotherapy for now until improvement of his other condition especially with the significant arthritis he is dealing with at this point. I will see him back for follow-up visit in 3 months with repeat CT scan of the chest, abdomen pelvis for restaging of his disease. For the pain management I will give the patient prescription for fentanyl 25 mcg/hour every 3 days.  He will continue with ibuprofen and oxycodone  as needed. For the hypothyroidism, he will continue his current treatment with levothyroxine and follow-up with his primary care provider. The patient was advised to call immediately if he has any concerning symptoms in the interval. The patient voices understanding of current disease status and treatment options and is in agreement with the current care plan. All questions were answered. The patient knows to call the clinic with any problems, questions or concerns. We can certainly see the patient much sooner if necessary.  Disclaimer: This note was dictated with  voice recognition software. Similar sounding words can inadvertently be transcribed and may not be corrected upon review.

## 2020-04-15 ENCOUNTER — Ambulatory Visit (INDEPENDENT_AMBULATORY_CARE_PROVIDER_SITE_OTHER): Payer: Medicare HMO

## 2020-04-15 ENCOUNTER — Ambulatory Visit (INDEPENDENT_AMBULATORY_CARE_PROVIDER_SITE_OTHER): Payer: Medicare HMO | Admitting: Family Medicine

## 2020-04-15 ENCOUNTER — Encounter: Payer: Self-pay | Admitting: Family Medicine

## 2020-04-15 VITALS — BP 91/60 | HR 70 | Temp 97.4°F | Ht 73.0 in | Wt 197.8 lb

## 2020-04-15 DIAGNOSIS — M19072 Primary osteoarthritis, left ankle and foot: Secondary | ICD-10-CM | POA: Diagnosis not present

## 2020-04-15 DIAGNOSIS — G8929 Other chronic pain: Secondary | ICD-10-CM

## 2020-04-15 DIAGNOSIS — M25571 Pain in right ankle and joints of right foot: Secondary | ICD-10-CM

## 2020-04-15 DIAGNOSIS — M25561 Pain in right knee: Secondary | ICD-10-CM | POA: Diagnosis not present

## 2020-04-15 DIAGNOSIS — M25572 Pain in left ankle and joints of left foot: Secondary | ICD-10-CM

## 2020-04-15 DIAGNOSIS — I482 Chronic atrial fibrillation, unspecified: Secondary | ICD-10-CM

## 2020-04-15 DIAGNOSIS — M19071 Primary osteoarthritis, right ankle and foot: Secondary | ICD-10-CM | POA: Diagnosis not present

## 2020-04-15 DIAGNOSIS — M25562 Pain in left knee: Secondary | ICD-10-CM

## 2020-04-15 MED ORDER — KETOROLAC TROMETHAMINE 60 MG/2ML IM SOLN
60.0000 mg | Freq: Once | INTRAMUSCULAR | Status: AC
  Administered 2020-04-15: 60 mg via INTRAMUSCULAR

## 2020-04-15 NOTE — Addendum Note (Signed)
Addended by: Betti Cruz on: 04/15/2020 03:57 PM   Modules accepted: Orders

## 2020-04-15 NOTE — Patient Instructions (Addendum)
It was very nice to see you today!  Please start the methylprednisolone.  We will check blood work and x-rays today.  We will give you an injection of a medication called Toradol.  Please continue your other medications.  We will contact you next week with the results.  Take care, Dr Jerline Pain  Please try these tips to maintain a healthy lifestyle:   Eat at least 3 REAL meals and 1-2 snacks per day.  Aim for no more than 5 hours between eating.  If you eat breakfast, please do so within one hour of getting up.    Each meal should contain half fruits/vegetables, one quarter protein, and one quarter carbs (no bigger than a computer mouse)   Cut down on sweet beverages. This includes juice, soda, and sweet tea.     Drink at least 1 glass of water with each meal and aim for at least 8 glasses per day   Exercise at least 150 minutes every week.

## 2020-04-15 NOTE — Progress Notes (Signed)
   Victor Huber is a 70 y.o. male who presents today for an office visit.  Assessment/Plan:  New/Acute Problems: Bilateral knee/ankle pain Unusual distribution.  Likely has underlying osteoarthritis which is playing a role though concern for possible inflammatory arthropathy given distribution and inflamed joints.  We will check labs today including CRP, sed rate, ANA, rheumatoid factor, uric acid.  He has had x-ray of knees done recently as well as MRI but has not had any ankle imaging.  We will check ankle plain films today.  He is taking a fentanyl patch as prescribed from his oncologist.  He has not yet started the methylprednisolone.  Instructed him to go ahead and start.  We will give 60 mg of IM Toradol today.  Depending on results may need referral to sports medicine.  Chronic Problems Addressed Today: No problem-specific Assessment & Plan notes found for this encounter.     Subjective:  HPI:  Patient here with bilateral knee and ankle pain.  Started a few months ago.  He fell about 3 months ago.  Followed up with his orthopedist.  They reportedly did an x-ray of his right knee and later an MRI.  Which showed that he had a mild meniscal tear but was told that there is nothing to be done short of surgery.  Reportedly had a cortisone injection performed which did not offer significant improvement.  Over the last several weeks to months he has noticed increasing pain in bilateral ankles and bilateral knees.  He has quite a bit of swelling in all 4 of these joints as well.  He has been taking oxycodone as prescribed by his oncologist.  This has not made a significant difference.  Reportedly had fluid analysis at the orthopedic doctor which was negative.  No reported fevers or chills.  Knees occasionally feel hot and inflamed.  Heat makes pain better.  Worse with certain motions.       Objective:  Physical Exam: BP 91/60   Pulse 70   Temp (!) 97.4 F (36.3 C) (Temporal)   Ht 6\' 1"   (1.854 m)   Wt 197 lb 12.8 oz (89.7 kg)   SpO2 97%   BMI 26.10 kg/m   Gen: No acute distress, resting comfortably MSK: Bilateral knees and ankles with gross effusion.  Knee is slightly warm to touch.  Limited range of motion due to pain.  Neurovascular intact distally. Neuro: Grossly normal, moves all extremities Psych: Normal affect and thought content   Time Spent: 35 minutes of total time was spent on the date of the encounter performing the following actions: chart review prior to seeing the patient, obtaining history, performing a medically necessary exam, counseling on the treatment plan including labs and imaging, placing orders, and documenting in our EHR.       Algis Greenhouse. Jerline Pain, MD 04/15/2020 3:07 PM

## 2020-04-18 LAB — ANA: Anti Nuclear Antibody (ANA): NEGATIVE

## 2020-04-18 LAB — SEDIMENTATION RATE: Sed Rate: 33 mm/h — ABNORMAL HIGH (ref 0–20)

## 2020-04-18 LAB — RHEUMATOID FACTOR: Rheumatoid fact SerPl-aCnc: <14 IU/mL (ref <14)

## 2020-04-18 LAB — URIC ACID: Uric Acid, Serum: 3.9 mg/dL — ABNORMAL LOW (ref 4.0–8.0)

## 2020-04-18 LAB — C-REACTIVE PROTEIN: CRP: 56.7 mg/L — ABNORMAL HIGH (ref <8.0)

## 2020-04-19 DIAGNOSIS — Z20828 Contact with and (suspected) exposure to other viral communicable diseases: Secondary | ICD-10-CM | POA: Diagnosis not present

## 2020-04-19 NOTE — Progress Notes (Signed)
Please inform patient of the following:  His xrays showed degenerative arthritis in both of his ankles. His pain should have improved with the prednisone. Recommend referral to sports medicine for further management.

## 2020-04-20 ENCOUNTER — Other Ambulatory Visit: Payer: Self-pay

## 2020-04-20 ENCOUNTER — Encounter: Payer: Self-pay | Admitting: Gastroenterology

## 2020-04-20 ENCOUNTER — Ambulatory Visit (AMBULATORY_SURGERY_CENTER): Payer: Medicare HMO

## 2020-04-20 VITALS — Ht 73.0 in | Wt 195.0 lb

## 2020-04-20 DIAGNOSIS — K222 Esophageal obstruction: Secondary | ICD-10-CM

## 2020-04-20 NOTE — Progress Notes (Signed)
Pt verified name, DOB, address and insurance during PV today.   Pt mailed instruction packet to included paper to complete and mail back to [facility] with addressed and stamped envelope, copy of consent form to read and not return, and instructions.  PV completed over the phone. Pt encouraged to call with questions or issues   No allergies to soy or egg Pt is not on blood thinners or diet pills Denies issues with sedation/intubation Denies atrial flutter/fib Denies constipation    Pt is aware of Covid safety and care partner requirements.  Self report wt  195 lb  2 days ago  Eliquis told to hold 2 days before procedure

## 2020-04-24 ENCOUNTER — Encounter: Payer: Self-pay | Admitting: Family Medicine

## 2020-04-25 NOTE — Telephone Encounter (Signed)
See note

## 2020-04-28 ENCOUNTER — Telehealth: Payer: Self-pay

## 2020-04-28 ENCOUNTER — Encounter: Payer: Self-pay | Admitting: Family Medicine

## 2020-04-28 NOTE — Telephone Encounter (Signed)
ERROR

## 2020-04-28 NOTE — Telephone Encounter (Signed)
Spoke with patient's wife. Stated patient still in pain, pain radiating to knees with difficulty walking  Unsure if patient wants sport medicine referral  Want to know if sport medicine provider will prescribed Rx Fentanyl If patient agreed to sport medicine he does not want to go to Dr Mardelle Matte Is any pain medication can be prescribed to alleviate pain?

## 2020-04-29 ENCOUNTER — Other Ambulatory Visit: Payer: Self-pay | Admitting: *Deleted

## 2020-04-29 ENCOUNTER — Telehealth: Payer: Self-pay | Admitting: *Deleted

## 2020-04-29 DIAGNOSIS — M25561 Pain in right knee: Secondary | ICD-10-CM

## 2020-04-29 DIAGNOSIS — Z20822 Contact with and (suspected) exposure to covid-19: Secondary | ICD-10-CM | POA: Diagnosis not present

## 2020-04-29 DIAGNOSIS — M25562 Pain in left knee: Secondary | ICD-10-CM

## 2020-04-29 NOTE — Telephone Encounter (Signed)
Received call from patient's wife asking for Dr. Worthy Flank nurse, Diane. Advised that diane was off today and that I could help.  Victor Huber states she prefers to talk to Victor Huber and asked to have diane call her on Monday.  She states it is not an urgent issue. Advised that I would pass this message on.

## 2020-04-29 NOTE — Telephone Encounter (Signed)
Referral placed.

## 2020-04-29 NOTE — Progress Notes (Signed)
Subjective:   I, Victor Huber, LAT, ATC acting as a scribe for Victor Leader, MD.  I'm seeing this patient as a consultation for Dr. Dimas Chyle. Note will be routed back to referring provider/PCP.  CC: Bilateral ankle and bilateral knee pain  HPI: Pt is a 70 y/o male c/o bilat knee and bilat ankle pain. Bilat knee pain has been ongoing since Nov w/ no known MOI. Pt notes that he is a cancer pt and is on immunotherapy. Pt locates pain to anterior aspect of knee, deep to patella. Pt reports hx of R knee drain and steroid shot. Knee > ankles, especially R knee.  Mechanical symptoms: yes- clicking Aggravates: pressure- standing or walking Rx tried: fentanyl patch, Tylenol, IBU, oxycodone  Bilat ankle pain has been ongoing since Nov. Pt locates pain to talocrural joint Mechanical symptoms: yes Aggravates: standing and walking Rx tried: fentanal patch, Tylenol, IBU, oxycodone  Dx imaging: 04/15/20 R & L ankle XR  Past medical history, Surgical history, Family history, Social history, Allergies, and medications have been entered into the medical record, reviewed.  Lung cancer stage IV metastases to left femur. Diagnosed 2016 currently well controlled with immunotherapy. On atorvastatin for hyperlipidemia  Review of Systems: No new headache, visual changes, nausea, vomiting, diarrhea, constipation, dizziness, abdominal pain, skin rash, fevers, chills, night sweats, weight loss, swollen lymph nodes, body aches, joint swelling, muscle aches, chest pain, shortness of breath, mood changes, visual or auditory hallucinations.   Objective:    Vitals:   05/02/20 1439  BP: 100/72  Pulse: (!) 103  SpO2: 96%   General: Well Developed, well nourished, and in no acute distress.  Neuro/Psych: Alert and oriented x3, extra-ocular muscles intact, able to move all 4 extremities, sensation grossly intact. Skin: Warm and dry, no rashes noted.  Respiratory: Not using accessory muscles, speaking in full  sentences, trachea midline.  Cardiovascular: Pulses palpable, no extremity edema. Abdomen: Does not appear distended. MSK significant muscle atrophy face arms and legs bilaterally : Right knee moderate effusion otherwise normal-appearing Normal motion with crepitation. Mildly tender palpation medial joint line. Stable ligamentous exam. Positive McMurray test.  Left knee minimal effusion normal-appearing otherwise. Normal motion with crepitation. Nontender. Stable ligamentous exam. Negative McMurray's test.  Ankles bilaterally mild effusion normal motion.  Lab and Radiology Results EXAM: LEFT ANKLE - 2 VIEW  COMPARISON:  Same-day right ankle radiographs.  FINDINGS: There is no evidence of fracture or dislocation. Small joint effusion. Mild degenerative change of the ankle joint. Mild dorsal midfoot degenerative change. Calcaneal enthesophytes. Vascular calcifications.  IMPRESSION: No acute fracture or dislocation. Mild degenerative changes of the ankle joint and dorsal midfoot. Small ankle joint effusion.   Electronically Signed   By: Dahlia Bailiff MD   On: 04/15/2020 19:20  EXAM: RIGHT ANKLE - 2 VIEW  COMPARISON:  Same day left ankle radiographs.  FINDINGS: There is no evidence of fracture or dislocation. Small ankle joint effusion. Mild dorsal midfoot degenerative change. Calcaneal enthesophytes. Vascular calcifications.  IMPRESSION: No acute osseous abnormality in the right ankle. Small ankle joint effusion and mild dorsal midfoot degenerative change.   Electronically Signed   By: Dahlia Bailiff MD   On: 04/15/2020 19:22  I, Victor Huber, personally (independently) visualized and performed the interpretation of the images attached in this note.  X-ray images bilateral knees obtained today personally and independently interpreted  Right knee: Mild to moderate degenerative old avulsion fragment versus loose body medial knee  Left knee: Intact  intramedullary nail  femur. No fractures visible. Mild degenerative changes.  Await formal radiology review   Diagnostic Limited MSK Ultrasound of: Right knee and left knee Right knee: Moderate effusion. Intact quad and patellar tendon. Degenerative lateral meniscus. Degenerative medial meniscus. No Baker's cyst. Left knee mild effusion intact quad and patellar tendon. Normal-appearing medial lateral meniscus. No Baker's cyst. Impression: DJD bilaterally with effusion. Probable right knee meniscus tear.   Impression and Recommendations:    Assessment and Plan: 70 y.o. male with bilateral knee pain right worse than left.  Patient does have some degenerative changes seen on x-ray and ultrasound but not enough to explain the severity of his pain.  He has also failed to improve with prior steroid injection.  I do not think that another repeat steroid injection is going to make much of a difference.  He has several other potential causes of pain including unknown inflammatory condition, significant muscle weakness secondary to weight loss secondary to his pancreatic insufficiency.  Plan for physical therapy.  Additionally work on authorization of hyaluronic acid injections as this may be helpful..  Additionally he has persistent myalgias.  He is on a statin and may have statin myalgia.  We will ask him to stop the atorvastatin for a few weeks and see how he feels.  Also will prescribe gabapentin as this may help with some of his pain.  Additionally he has had significant weight loss that is thought to be due to his pancreatic insufficiency.  Discussed dietary changes.  Recommend adding 60 g of protein.  Recommend fairlife high-protein nutrition drinks.  However given the severity of his weight loss and protein malnutrition I think he would benefit from referral to registered dietitian.  Recheck back in 2 weeks.  PDMP not reviewed this encounter. Orders Placed This Encounter  Procedures   [place]  LIMITED JOINT SPACE STRUCTURES LOW BILAT(NO LINKED CHARGES)    Standing Status:   Future    Number of Occurrences:   1    Standing Expiration Date:   10/30/2020    Order Specific Question:   Reason for Exam (SYMPTOM  OR DIAGNOSIS REQUIRED)    Answer:   Bilateral knee pain    Order Specific Question:   Preferred imaging location?    Answer:   [facility]   DG Knee AP/LAT W/[facility] Left    Standing Status:   Future    Number of Occurrences:   1    Standing Expiration Date:   05/02/2021    Order Specific Question:   Reason for Exam (SYMPTOM  OR DIAGNOSIS REQUIRED)    Answer:   bilateral knee pain    Order Specific Question:   Preferred imaging location?    Answer:   [facility]   DG Knee AP/LAT W/[facility] Right    Standing Status:   Future    Number of Occurrences:   1    Standing Expiration Date:   05/02/2021    Order Specific Question:   Reason for Exam (SYMPTOM  OR DIAGNOSIS REQUIRED)    Answer:   bilateral knee pain    Order Specific Question:   Preferred imaging location?    Answer:   [facility]   Ambulatory referral to Physical Therapy    Referral Priority:   Routine    Referral Type:   Physical Medicine    Referral Reason:   Specialty Services Required    Requested Specialty:   Physical Therapy   Amb ref to Medical Nutrition Therapy-MNT  Referral Priority:   Routine    Referral Type:   Consultation    Referral Reason:   Specialty Services Required    Requested Specialty:   Nutrition    Number of Visits Requested:   1   Meds ordered this encounter  Medications   gabapentin (NEURONTIN) 300 MG capsule    Sig: Take 1 capsule (300 mg total) by mouth at bedtime as needed.    Dispense:  90 capsule    Refill:  3    Discussed warning signs or symptoms. Please see discharge instructions. Patient expresses understanding.   The above documentation has been reviewed and is accurate and complete Victor Huber, M.D.

## 2020-05-02 ENCOUNTER — Ambulatory Visit (INDEPENDENT_AMBULATORY_CARE_PROVIDER_SITE_OTHER): Payer: Medicare HMO

## 2020-05-02 ENCOUNTER — Ambulatory Visit: Payer: Medicare HMO | Admitting: Family Medicine

## 2020-05-02 ENCOUNTER — Other Ambulatory Visit: Payer: Self-pay

## 2020-05-02 ENCOUNTER — Ambulatory Visit: Payer: Self-pay

## 2020-05-02 VITALS — BP 100/72 | HR 103 | Ht 73.0 in | Wt 194.6 lb

## 2020-05-02 DIAGNOSIS — M25561 Pain in right knee: Secondary | ICD-10-CM

## 2020-05-02 DIAGNOSIS — E43 Unspecified severe protein-calorie malnutrition: Secondary | ICD-10-CM

## 2020-05-02 DIAGNOSIS — M25562 Pain in left knee: Secondary | ICD-10-CM

## 2020-05-02 DIAGNOSIS — M791 Myalgia, unspecified site: Secondary | ICD-10-CM

## 2020-05-02 DIAGNOSIS — M170 Bilateral primary osteoarthritis of knee: Secondary | ICD-10-CM | POA: Diagnosis not present

## 2020-05-02 DIAGNOSIS — M25571 Pain in right ankle and joints of right foot: Secondary | ICD-10-CM

## 2020-05-02 DIAGNOSIS — M25572 Pain in left ankle and joints of left foot: Secondary | ICD-10-CM

## 2020-05-02 MED ORDER — GABAPENTIN 300 MG PO CAPS: 300.0000 mg | ORAL_CAPSULE | Freq: Every evening | ORAL | 3 refills | Status: DC | PRN

## 2020-05-02 NOTE — Patient Instructions (Addendum)
Thank you for coming in today.  I will refer you to a registered dietitian and a physical therapist.   Please get an Xray today before you leave today.  Try adding 60 g of protein per day.   Fairlife protein shakes are good. You can get them at [facility] or [employer].   STOP lipitor (atorvistatin) for 4 weeks. See how you feel.  Then restart it.   Recheck with me in 2 weeks.   I will also work on authorizing the gel shots.   Lets try Gabapentin.

## 2020-05-03 ENCOUNTER — Other Ambulatory Visit: Payer: Self-pay | Admitting: Internal Medicine

## 2020-05-03 MED ORDER — FENTANYL 25 MCG/HR TD PT72: 1.0000 | MEDICATED_PATCH | TRANSDERMAL | 0 refills | Status: DC

## 2020-05-03 NOTE — Telephone Encounter (Addendum)
Pt requests a refill for his fentanyl.   It is helping his leg pain . He sees sports medicine in 2 weeks

## 2020-05-03 NOTE — Progress Notes (Signed)
Mild arthritis of both knees is present.

## 2020-05-04 ENCOUNTER — Telehealth: Payer: Self-pay | Admitting: Internal Medicine

## 2020-05-04 ENCOUNTER — Telehealth: Payer: Self-pay | Admitting: Medical Oncology

## 2020-05-04 NOTE — Telephone Encounter (Signed)
Wife asked questions about lab tests indicating SE from immunotherapy.

## 2020-05-04 NOTE — Telephone Encounter (Signed)
Scheduled appointments per 1/31 sch msg. Called patient, no answer. Left message with appointment date and time.

## 2020-05-09 ENCOUNTER — Inpatient Hospital Stay: Payer: Medicare HMO | Admitting: Nutrition

## 2020-05-12 ENCOUNTER — Telehealth: Payer: Self-pay | Admitting: Nutrition

## 2020-05-12 ENCOUNTER — Encounter: Payer: Medicare HMO | Admitting: Gastroenterology

## 2020-05-12 ENCOUNTER — Inpatient Hospital Stay: Payer: Medicare HMO | Attending: Internal Medicine | Admitting: Nutrition

## 2020-05-12 NOTE — Progress Notes (Signed)
See telephone note.

## 2020-05-12 NOTE — Telephone Encounter (Signed)
Telephone consult completed with patient and wife.  70 year old male diagnosed with metastatic lung cancer receiving Keytruda and followed by Dr. Julien Nordmann.  Past medical history includes hypothyroidism, hypertension, hyperlipidemia, chemotherapy and radiation treatments, GERD, dysphagia, depression, COPD, and atrial fibrillation.  Medications include Nexium, Creon, Synthroid, Magic mouthwash, and Carafate.  Labs were reviewed.  Height: 6 feet 1 inches. Weight: 194.6 pounds January 31. Patient weighed 220 pounds in November 2021. BMI: 25.67. ECOG: 1.  Noted MD feels patient has had weight loss secondary to pancreatic insufficiency. Patient denies diarrhea and nausea.  Reports he is taking Creon but at half the dose his MD prescribed because the order could not be filled correctly.  He feels his pancreas is "working". Patient has lost 12% body weight over 2 months which is significant. Dietary recall reveals patient is only consuming broth, soups, mashed potatoes, eggs, and orange juice.  He tolerates 2% milk.  Reports he cannot swallow ice cream and avoids other dairy foods.  He tried chopped meats but could not swallow them.  Nutrition diagnosis: Unintended weight loss related to inadequate oral intake as evidenced by 12% weight loss over 2 months.  Intervention: Educated patient to consume [employer] essentials mixed with 2% milk 3 times daily between meals.  This provides 260 cal and 13 g of protein per glass. Encourage patient to focus on increasing overall calories. Educated on strategies for pureing foods to smooth textures and adding gravies and sauces for easier swallowing. Recommended smaller more frequent meals and snacks. Mail fact sheets to patient at home address.  Will provide contact information.  Questions were answered and teach back method used.  Monitoring, evaluation, goals: Patient will tolerate increased calories to promote weight gain/weight  stabilization.  No follow-up has been scheduled.  **Disclaimer: This note was dictated with voice recognition software. Similar sounding words can inadvertently be transcribed and this note may contain transcription errors which may not have been corrected upon publication of note.**

## 2020-05-13 NOTE — Progress Notes (Signed)
   I, [name], LAT, ATC, am serving as scribe for Dr. [name].  Victor Huber is a 70 y.o. male who presents to [place] at [facility] today for f/u acute bilat knee and ankle pain ongoing since Nov w/ no known MOI. Pt was last seen by Dr. [name] on 05/02/20 and was referred to PT at [facility] of which he's completed no visits. Pt was also advised to increase protein consumption, referred to a nutritionist, and prescribed Gabapentin. Today, pt reports that his B knees and ankles are about the same, R>L.  He states that he has PT scheduled for 05/26/20.  He is taking the Gabapentin daily at night.   Additionally he was complaining of myalgias and was advised to temporarily because his atorvastatin.  He has done so and does not think it has made much of a difference.   Dx imaging: 05/02/20 R & L knee XR  04/16/19 R & L ankle XR  Pertinent review of systems: No fevers or chills  Relevant historical information: History of lung cancer.  Chronic pancreatitis.   Exam:  BP 102/70 (BP Location: Right Arm, Patient Position: Sitting, Cuff Size: Normal)   Pulse (!) 55   Ht 6\' 1"  (1.854 m)   Wt 191 lb 9.6 oz (86.9 kg)   SpO2 98%   BMI 25.28 kg/m  General: Well Developed, protein calorie malnourished appearing, and in no acute distress.   MSK: Decreased muscle bulk in bilateral lower extremities     Assessment and Plan: 71 y.o. male with bilateral knee pain thought to be due to DJD and decreased muscle strength in his quadriceps.  Physical therapy is in the process of starting in about 10 days.  This should be helpful.  Increasing muscle bulk will be helpful as well and this is discussed separately below.  We will work on authorizing hyaluronic acid injections as well.  Back in 6 weeks if not sooner.  Protein calorie malnutrition to be due to pancreatic insufficiency.  At the last visit patient was referred to registered dietitian which has been achieved.   Patient has increased his calories and protein.  We will follow this along.  Myalgias: Nausea his statin did not help.  Recommend resuming atorvastatin.  Nighttime pain and muscle twitching thought to be restless leg syndrome.  Okay to increase gabapentin if needed.  Check 6 weeks.  Discussed warning signs or symptoms. Please see discharge instructions. Patient expresses understanding.   The above documentation has been reviewed and is accurate and complete [name], M.D.

## 2020-05-16 ENCOUNTER — Ambulatory Visit: Payer: Self-pay

## 2020-05-16 ENCOUNTER — Other Ambulatory Visit: Payer: Self-pay

## 2020-05-16 ENCOUNTER — Ambulatory Visit: Payer: Medicare HMO | Admitting: Family Medicine

## 2020-05-16 ENCOUNTER — Encounter: Payer: Self-pay | Admitting: Family Medicine

## 2020-05-16 VITALS — BP 102/70 | HR 55 | Ht 73.0 in | Wt 191.6 lb

## 2020-05-16 DIAGNOSIS — G2581 Restless legs syndrome: Secondary | ICD-10-CM

## 2020-05-16 DIAGNOSIS — E43 Unspecified severe protein-calorie malnutrition: Secondary | ICD-10-CM | POA: Diagnosis not present

## 2020-05-16 DIAGNOSIS — M25561 Pain in right knee: Secondary | ICD-10-CM | POA: Diagnosis not present

## 2020-05-16 DIAGNOSIS — M25562 Pain in left knee: Secondary | ICD-10-CM | POA: Diagnosis not present

## 2020-05-16 DIAGNOSIS — M791 Myalgia, unspecified site: Secondary | ICD-10-CM

## 2020-05-16 NOTE — Progress Notes (Signed)
Attempted to obtain medical history via telephone, unable to reach at this time. I left a voicemail to return pre surgical testing department's phone call.  

## 2020-05-16 NOTE — Patient Instructions (Addendum)
Thank you for coming in today.  Ok to increase the gabapentin to 2 pills at bedtime as needed for restless leg.   Try the straight leg raises and knee extensions.   30 reps 2-3x daily if you can.   I will follow up with my staff about the gel shots.   Recheck with me in 6 weeks.

## 2020-05-18 ENCOUNTER — Ambulatory Visit: Payer: Medicare HMO | Admitting: Emergency Medicine

## 2020-05-18 ENCOUNTER — Encounter: Payer: Self-pay | Admitting: Emergency Medicine

## 2020-05-18 ENCOUNTER — Other Ambulatory Visit: Payer: Self-pay

## 2020-05-18 DIAGNOSIS — J441 Chronic obstructive pulmonary disease with (acute) exacerbation: Secondary | ICD-10-CM | POA: Diagnosis not present

## 2020-05-18 DIAGNOSIS — C3492 Malignant neoplasm of unspecified part of left bronchus or lung: Secondary | ICD-10-CM

## 2020-05-18 DIAGNOSIS — R059 Cough, unspecified: Secondary | ICD-10-CM | POA: Insufficient documentation

## 2020-05-18 NOTE — Progress Notes (Signed)
Subjective:    Patient ID: Victor Huber, male    DOB: [birth year], 70 y.o.   MRN: [id number]  HPI  ROV 05/18/20 --follow-up visit 70 year old man with COPD and stage IV adenocarcinoma of the lung for which she underwent left lower lobe wedge resection, XRT/chemo, Keytruda.  He stopped the [facility] about 9 weeks ago as it was concerned that it may be contributing to pancreatitis and arthralgias.  His most recent CT chest was 04/12/2020.  His left lower lung was unchanged.  There was a new 8 mm more inferior medial left lower lobe pulmonary nodule of unknown significance.  No other evidence of distant disease was seen.  He developed sore throat and cough about 3 weeks ago, minimal nasal drainage. Some hoarseness. COVID test was negative 1/28.  Has used some decongestant, cough drops. He was having swallowing difficulty, is being seen by Dr Rush Landmark and is having a repeat EGD this month. He is on nexium and carafate. He is using albuterol daily. Does sometimes help his cough, also facilitates clearance of some mucous from his chest. He coughs at night even in his sleep.    Review of Systems  Constitutional: Negative for fever and unexpected weight change.  HENT: Negative for congestion, dental problem, ear pain, nosebleeds, postnasal drip, rhinorrhea, sinus pressure, sneezing, sore throat and trouble swallowing.   Eyes: Negative for redness and itching.  Respiratory: Positive for cough and shortness of breath. Negative for chest tightness and wheezing.   Cardiovascular: Positive for palpitations. Negative for leg swelling.  Gastrointestinal: Negative for nausea and vomiting.  Genitourinary: Negative for dysuria.  Musculoskeletal: Negative for joint swelling.  Skin: Positive for rash.  Neurological: Negative for headaches.  Hematological: Does not bruise/bleed easily.  Psychiatric/Behavioral: Negative for dysphoric mood. The patient is not nervous/anxious.     Past Medical History:   Diagnosis Date  . Adenocarcinoma of left lung, stage 3 ([place]) 08/15/2016  . Allergy   . Anxiety   . Arthritis   . Atrial fibrillation ([place]) 08/15/2016  . Atrial fibrillation ([place])   . Cancer, metastatic to bone ([place])   . Cataract    Waiting to schedule bilateral cataract surgery  . COPD (chronic obstructive pulmonary disease) ([place]) 08/15/2016  . Depression   . Dysphagia   . Dysrhythmia    a-fib  . GERD (gastroesophageal reflux disease)   . Heart murmur    atenlol for A Fib/Eliquis  . History of chemotherapy   . History of radiation therapy   . Hyperlipidemia   . Hypothyroid 08/15/2016  . Longstanding persistent atrial fibrillation ([place]) 08/29/2016  . Pathologic fracture    left femur  . Pneumonitis   . S/P TURP 08/15/2016  . Wears glasses   . Wears hearing aid in both ears      Family History  Problem Relation Age of Onset  . Breast cancer Mother   . Breast cancer Maternal Aunt   . Breast cancer Maternal Aunt   . Lung disease Neg Hx   . Colon cancer Neg Hx   . Stomach cancer Neg Hx   . Pancreatic cancer Neg Hx   . Rectal cancer Neg Hx   . Colon polyps Neg Hx   . Esophageal cancer Neg Hx      Social History   Socioeconomic History  . Marital status: Married    Spouse name: Not on file  . Number of children: Not on file  . Years of education: Not on file  . Highest  education level: Not on file  Occupational History  . Not on file  Tobacco Use  . Smoking status: Former Smoker    Packs/day: 1.00    Years: 50.00    Pack years: 50.00    Types: Cigarettes    Quit date: 08/15/2012    Years since quitting: 7.7  . Smokeless tobacco: Never Used  Vaping Use  . Vaping Use: Never used  Substance and Sexual Activity  . Alcohol use: No  . Drug use: No  . Sexual activity: Not on file  Other Topics Concern  . Not on file  Social History Narrative   [facility] (09/26/16):   Originally from [place]. Moved to [facility] February 2018. Always lived in [place]. Moved to be  closer to children & grandchildren. No international travel. 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 No mold exposure. No bird exposure or hot tub exposure. Enjoys reading. Previously enjoyed wood working with domestic woods.    Social Determinants of Health   Financial Resource Strain: Low Risk   . Difficulty of Paying Living Expenses: Not hard at all  Food Insecurity: No Food Insecurity  . Worried About Charity fundraiser in the Last Year: Never true  . Ran Out of Food in the Last Year: Never true  Transportation Needs: No Transportation Needs  . Lack of Transportation (Medical): No  . Lack of Transportation (Non-Medical): No  Physical Activity: Not on file  Stress: Not on file  Social Connections: Not on file  Intimate Partner Violence: Not on file  Lots of sawdust exposure.  No military  Allergies  Allergen Reactions  . Carboplatin Itching, Nausea And Vomiting and Other (See Comments)    Flushing  . Flecainide Hypertension    CAUSED HEART ISSUES   . Debrox [Carbamide Peroxide]     Swelling in ear canal.      Outpatient Medications Prior to Visit  Medication Sig Dispense Refill  . albuterol (VENTOLIN HFA) 108 (90 Base) MCG/ACT inhaler Inhale 1-2 puffs into the lungs every 4 (four) hours as needed for wheezing or shortness of breath. 3 month supply 24 g 3  . atenolol (TENORMIN) 50 MG tablet TAKE 1 TABLET EVERY DAY (Patient taking differently: Take 50 mg by mouth daily.) 90 tablet 3  . atorvastatin (LIPITOR) 40 MG tablet TAKE 1 TABLET EVERY DAY 90 tablet 2  . ELIQUIS 5 MG TABS tablet TAKE 1 TABLET TWICE DAILY (Patient taking differently: Take 5 mg by mouth 2 (two) times daily.) 180 tablet 1  . esomeprazole (NEXIUM) 40 MG capsule Take 1 capsule (40 mg total) by mouth 2 (two) times daily before a meal. 180 capsule 3  . fentaNYL (DURAGESIC) 25 MCG/HR Place 1 patch onto the skin every 3 (three) days. 10 patch  0  . gabapentin (NEURONTIN) 300 MG capsule Take 1 capsule (300 mg total) by mouth at bedtime as needed. (Patient taking differently: Take 300 mg by mouth at bedtime as needed (pain).) 90 capsule 3  . ibuprofen (ADVIL,MOTRIN) 400 MG tablet Take 400 mg by mouth every 4 (four) hours as needed for moderate pain.    [place] Kitchen levothyroxine (SYNTHROID) 175 MCG tablet Take 1 tablet (175 mcg total) by mouth daily before breakfast. Take with 25 mcg to equal 200 mcg daily 90 tablet 3  . lipase/protease/amylase (CREON) 36000 UNITS CPEP capsule Take 4 capsules po during each meal. Take 1-2 capsules po during each snack.( up to 2  snacks daily). (Patient taking differently: Take 36,000-72,000 Units by mouth See admin instructions. Take 2 capsules po during each meal. Take 1 capsule po during each snack.( up to 2 snacks daily).) 480 capsule 3  . magic mouthwash SOLN Take 5 mLs by mouth 4 (four) times daily as needed for mouth pain. 240 mL 2  . Oxycodone HCl 10 MG TABS Take 1 tablet (10 mg total) by mouth daily as needed (pain). (Patient taking differently: Take 5 mg by mouth daily as needed (pain).) 30 tablet 0  . sodium chloride (OCEAN) 0.65 % SOLN nasal spray Place 1 spray into both nostrils as needed for congestion.    . sucralfate (CARAFATE) 1 g tablet Take 1 tablet (1 g total) by mouth 4 (four) times daily -  with meals and at bedtime. 90 tablet 2  . tamsulosin (FLOMAX) 0.4 MG CAPS capsule Take 1 capsule (0.4 mg total) by mouth daily. 90 capsule 4  . venlafaxine XR (EFFEXOR-XR) 37.5 MG 24 hr capsule Take 37.5 mg by mouth 2 (two) times daily.     No facility-administered medications prior to visit.         Objective:   Physical Exam  Today's Vitals   05/18/20 1435  BP: 108/68  Pulse: 62  Temp: (!) 97 F (36.1 C)  TempSrc: Temporal  SpO2: 98%  Weight: 191 lb 12.8 oz (87 kg)  Height: 6\' 1"  (1.854 m)   Body mass index is 25.3 kg/m.   Gen: Pleasant, overweight man, in no distress,  normal  affect  ENT: No lesions,  mouth clear,  oropharynx clear, no postnasal drip  Neck: No JVD, no stridor  Lungs: No use of accessory muscles, good air movement, no crackles, no wheezing on a normal breath, no wheeze on a forced exp.   Cardiovascular: RRR, heart sounds normal, no murmur or gallops, no peripheral edema  Musculoskeletal: No deformities, no cyanosis or clubbing  Neuro: alert, awake, non focal  Skin: Warm, no lesions or rash     Assessment & Plan:  Cough Sore throat and cough for about 3 weeks that are bit difficult to characterize but do not sound like flaring of his COPD symptoms.  Consider GERD given his dysphagia-he connects his swallowing difficulty to the throat discomfort.  He is currently under evaluation by GI for reflux and dysphagia.  I will defer any maintenance bronchodilator therapy for now, wait to see how his GI work-up unfolds, with the cough continues.  Chronic obstructive pulmonary disease ([place]) He may benefit from maintenance bronchodilator therapy at some point going forward but his biggest complaint right now is sore throat and cough.  We will continue his albuterol as needed.  Consider starting maintenance BD if symptoms continue  Adenocarcinoma of left lung, stage 4 ([place]) Follow with Dr. Julien Nordmann.  He had a new nodular focus on most recent CT that is being followed.  Has been off of his Keytruda for 9 weeks as there was some concern that it may have been related to his pancreatitis and arthralgias.    Baltazar Apo, MD, PhD 05/18/2020, 5:44 PM [facility] [phone] or if no answer [phone]

## 2020-05-18 NOTE — Assessment & Plan Note (Signed)
Follow with Dr. Julien Nordmann.  He had a new nodular focus on most recent CT that is being followed.  Has been off of his Keytruda for 9 weeks as there was some concern that it may have been related to his pancreatitis and arthralgias.

## 2020-05-18 NOTE — Assessment & Plan Note (Signed)
Sore throat and cough for about 3 weeks that are bit difficult to characterize but do not sound like flaring of his COPD symptoms.  Consider GERD given his dysphagia-he connects his swallowing difficulty to the throat discomfort.  He is currently under evaluation by GI for reflux and dysphagia.  I will defer any maintenance bronchodilator therapy for now, wait to see how his GI work-up unfolds, with the cough continues.

## 2020-05-18 NOTE — Assessment & Plan Note (Signed)
He may benefit from maintenance bronchodilator therapy at some point going forward but his biggest complaint right now is sore throat and cough.  We will continue his albuterol as needed.  Consider starting maintenance BD if symptoms continue

## 2020-05-18 NOTE — Patient Instructions (Signed)
Agree with endoscopy evaluation by Dr. Rush Landmark.  I believe that esophageal reflux, your swallowing issues may be contributing to sustaining your cough. Keep your albuterol available to use 2 puffs when needed for shortness of breath, chest tightness, coughing. We will not start a scheduled inhaler medication at this time but it may be helpful to do so going forward if your symptoms persist. Follow with Dr. Julien Nordmann for repeat chest imaging as planned. Follow with Dr. Lamonte Sakai in 2 months or sooner if you have any problems.

## 2020-05-19 ENCOUNTER — Other Ambulatory Visit (HOSPITAL_COMMUNITY)
Admission: RE | Admit: 2020-05-19 | Discharge: 2020-05-19 | Disposition: A | Discharge status: Home / Self Care | Payer: Medicare HMO | Source: Ambulatory Visit | Attending: Gastroenterology | Admitting: Gastroenterology

## 2020-05-19 DIAGNOSIS — Z01812 Encounter for preprocedural laboratory examination: Secondary | ICD-10-CM | POA: Diagnosis not present

## 2020-05-19 DIAGNOSIS — Z20822 Contact with and (suspected) exposure to covid-19: Secondary | ICD-10-CM | POA: Insufficient documentation

## 2020-05-19 LAB — SARS CORONAVIRUS 2 (TAT 6-24 HRS): SARS Coronavirus 2: NEGATIVE

## 2020-05-23 ENCOUNTER — Ambulatory Visit (HOSPITAL_COMMUNITY): Payer: Medicare HMO

## 2020-05-23 ENCOUNTER — Ambulatory Visit (HOSPITAL_COMMUNITY): Payer: Medicare HMO | Admitting: Certified Registered"

## 2020-05-23 ENCOUNTER — Ambulatory Visit (HOSPITAL_COMMUNITY)
Admission: RE | Admit: 2020-05-23 | Discharge: 2020-05-23 | Disposition: A | Discharge status: Home / Self Care | Payer: Medicare HMO | Attending: Gastroenterology | Admitting: Gastroenterology

## 2020-05-23 ENCOUNTER — Encounter (HOSPITAL_COMMUNITY)
Admission: RE | Disposition: A | Discharge status: Home / Self Care | Payer: Self-pay | Source: Home / Self Care | Attending: Gastroenterology

## 2020-05-23 ENCOUNTER — Other Ambulatory Visit: Payer: Self-pay

## 2020-05-23 DIAGNOSIS — K2210 Ulcer of esophagus without bleeding: Secondary | ICD-10-CM | POA: Insufficient documentation

## 2020-05-23 DIAGNOSIS — Z87891 Personal history of nicotine dependence: Secondary | ICD-10-CM | POA: Diagnosis not present

## 2020-05-23 DIAGNOSIS — Z8589 Personal history of malignant neoplasm of other organs and systems: Secondary | ICD-10-CM | POA: Insufficient documentation

## 2020-05-23 DIAGNOSIS — K2081 Other esophagitis with bleeding: Secondary | ICD-10-CM | POA: Diagnosis not present

## 2020-05-23 DIAGNOSIS — Z85118 Personal history of other malignant neoplasm of bronchus and lung: Secondary | ICD-10-CM | POA: Insufficient documentation

## 2020-05-23 DIAGNOSIS — I4811 Longstanding persistent atrial fibrillation: Secondary | ICD-10-CM | POA: Insufficient documentation

## 2020-05-23 DIAGNOSIS — K2101 Gastro-esophageal reflux disease with esophagitis, with bleeding: Secondary | ICD-10-CM | POA: Insufficient documentation

## 2020-05-23 DIAGNOSIS — K209 Esophagitis, unspecified without bleeding: Secondary | ICD-10-CM

## 2020-05-23 DIAGNOSIS — Z803 Family history of malignant neoplasm of breast: Secondary | ICD-10-CM | POA: Insufficient documentation

## 2020-05-23 DIAGNOSIS — E785 Hyperlipidemia, unspecified: Secondary | ICD-10-CM | POA: Diagnosis not present

## 2020-05-23 DIAGNOSIS — R1310 Dysphagia, unspecified: Secondary | ICD-10-CM | POA: Diagnosis not present

## 2020-05-23 DIAGNOSIS — K449 Diaphragmatic hernia without obstruction or gangrene: Secondary | ICD-10-CM | POA: Diagnosis not present

## 2020-05-23 DIAGNOSIS — Z888 Allergy status to other drugs, medicaments and biological substances status: Secondary | ICD-10-CM | POA: Diagnosis not present

## 2020-05-23 DIAGNOSIS — F418 Other specified anxiety disorders: Secondary | ICD-10-CM | POA: Diagnosis not present

## 2020-05-23 DIAGNOSIS — J449 Chronic obstructive pulmonary disease, unspecified: Secondary | ICD-10-CM | POA: Insufficient documentation

## 2020-05-23 DIAGNOSIS — K2100 Gastro-esophageal reflux disease with esophagitis, without bleeding: Secondary | ICD-10-CM | POA: Diagnosis not present

## 2020-05-23 HISTORY — PX: BIOPSY: SHX5522

## 2020-05-23 HISTORY — PX: ESOPHAGOGASTRODUODENOSCOPY (EGD) WITH PROPOFOL: SHX5813

## 2020-05-23 HISTORY — PX: SAVORY DILATION: SHX5439

## 2020-05-23 SURGERY — ESOPHAGOGASTRODUODENOSCOPY (EGD) WITH PROPOFOL
Anesthesia: Monitor Anesthesia Care

## 2020-05-23 MED ORDER — SODIUM CHLORIDE 0.9 % IV SOLN: INTRAVENOUS | Status: DC

## 2020-05-23 MED ORDER — PROPOFOL 500 MG/50ML IV EMUL
INTRAVENOUS | Status: DC | PRN
  Administered 2020-05-23: 135 ug/kg/min via INTRAVENOUS

## 2020-05-23 MED ORDER — PROPOFOL 10 MG/ML IV BOLUS
INTRAVENOUS | Status: DC | PRN
  Administered 2020-05-23 (×3): 20 mg via INTRAVENOUS

## 2020-05-23 MED ORDER — ESMOLOL HCL 100 MG/10ML IV SOLN
INTRAVENOUS | Status: DC | PRN
  Administered 2020-05-23 (×3): 20 mg via INTRAVENOUS

## 2020-05-23 MED ORDER — LACTATED RINGERS IV SOLN: Freq: Once | INTRAVENOUS | Status: AC

## 2020-05-23 MED ORDER — ONDANSETRON HCL 4 MG/2ML IJ SOLN
INTRAMUSCULAR | Status: DC | PRN
  Administered 2020-05-23: 4 mg via INTRAVENOUS

## 2020-05-23 SURGICAL SUPPLY — 14 items

## 2020-05-23 NOTE — Transfer of Care (Signed)
Immediate Anesthesia Transfer of Care Note  Patient: Victor Huber  Procedure(s) Performed: ESOPHAGOGASTRODUODENOSCOPY (EGD) WITH PROPOFOL (N/A ) BIOPSY SAVORY DILATION (N/A )  Patient Location: PACU  Anesthesia Type:MAC  Level of Consciousness: awake, alert  and oriented  Airway & Oxygen Therapy: Patient Spontanous Breathing and Patient connected to face mask oxygen  Post-op Assessment: Report given to RN, Post -op Vital signs reviewed and stable and Patient moving all extremities X 4  Post vital signs: Reviewed and stable  Last Vitals:  Vitals Value Taken Time  BP    Temp    Pulse    Resp 22 05/23/20 0917  SpO2    Vitals shown include unvalidated device data.  Last Pain:  Vitals:   05/23/20 0810  TempSrc: Axillary  PainSc: 0-No pain         Complications: No complications documented.

## 2020-05-23 NOTE — Anesthesia Procedure Notes (Signed)
Procedure Name: MAC Date/Time: 05/23/2020 8:35 AM Performed by: Niel Hummer, CRNA Pre-anesthesia Checklist: Patient identified, Emergency Drugs available, Suction available and Patient being monitored Oxygen Delivery Method: Simple face mask

## 2020-05-23 NOTE — Op Note (Signed)
[facility] Patient Name: Victor Huber Procedure Date: 05/23/2020 MRN: [id number] Attending MD: Justice Britain , MD Date of Birth: [birth year] CSN: [id number] Age: 70 Admit Type: Inpatient Procedure:                Upper GI endoscopy Indications:              Dysphagia, Esophagitis, Follow-up of esophagitis Providers:                Justice Britain, MD, Kary Kos RN, RN, Cletis Athens, Technician Referring MD:             Curt Bears, Collene Gobble, MD Medicines:                Monitored Anesthesia Care Complications:            No immediate complications. Estimated Blood Loss:     Estimated blood loss was minimal. Procedure:                Pre-Anesthesia Assessment:                           - Prior to the procedure, a History and Physical                            was performed, and patient medications and                            allergies were reviewed. The patient's tolerance of                            previous anesthesia was also reviewed. The risks                            and benefits of the procedure and the sedation                            options and risks were discussed with the patient.                            All questions were answered, and informed consent                            was obtained. Prior Anticoagulants: The patient has                            taken no previous anticoagulant or antiplatelet                            agents. ASA Grade Assessment: III - A patient with                            severe systemic disease. After reviewing the risks  and benefits, the patient was deemed in                            satisfactory condition to undergo the procedure.                           After obtaining informed consent, the endoscope was                            passed under direct vision. Throughout the                            procedure, the patient's  blood pressure, pulse, and                            oxygen saturations were monitored continuously. The                            GIF-H190 ([id number]) Olympus gastroscope was                            introduced through the mouth, and advanced to the                            second part of duodenum. The upper GI endoscopy was                            accomplished without difficulty. The patient                            tolerated the procedure. Scope In: Scope Out: Findings:      No gross lesions were noted in the proximal esophagus.      LA Grade D (one or more mucosal breaks involving at least 75% of       esophageal circumference) esophagitis with bleeding was found 25 to 31       cm from the incisors. There remains significant inflammation but the       Biopsies were taken with a cold forceps for histology.      No gross lesions were noted in the distal esophagus.      The Z-line was regular and was found 40 cm from the incisors.      After the rest of the EGD was complete, a guidewire was placed under       fluoroscopic guidance and the scope was withdrawn. Dilation was       performed in the entire esophagus with a Savary dilator with no       resistance at 10 mm, 11 mm and 12 mm and mild resistance at 12.8 mm and       14 mm. The dilation site was examined following endoscope reinsertion       and showed moderate mucosal disruption/wrent with moderate improvement       in luminal narrowing and no perforation.      A 3 cm hiatal hernia was present.      A medium amount of food (residue) was found in the entire examined  stomach. This precluded overt visualization of the gastric mucosa.      No gross lesions were noted in the duodenal bulb, in the first portion       of the duodenum and in the second portion of the duodenum. Impression:               - No gross lesions in esophagus proximally.                           - LA Grade D erosive esophagitis with bleeding                             (25-31 cm) - slightly improved from prior. Biopsied.                           - No gross lesions in esophagus distally.                           - Z-line regular, 40 cm from the incisors.                           - Dilation performed in the entire esophagus with                            mucosal wrent being noted after dilation up to 14                            mm.                           - 3 cm hiatal hernia.                           - A medium amount of food (residue) in the stomach.                            Unable to visualize gastric mucosa.                           - No gross lesions in the duodenal bulb, in the                            first portion of the duodenum and in the second                            portion of the duodenum. Moderate Sedation:      Not Applicable - Patient had care per Anesthesia. Recommendation:           - The patient will be observed post-procedure,                            until all discharge criteria are met.                           - Discharge patient to home.                           -  Patient has a contact number available for                            emergencies. The signs and symptoms of potential                            delayed complications were discussed with the                            patient. Return to normal activities tomorrow.                            Written discharge instructions were provided to the                            patient.                           - Dilation diet as per protocol. CLD for next                            4-hours. Then full liquid rest of today. Can take                            medications as necessary this morning.                           - Observe patient's clinical course.                           - Continue Nexium 40 mg twice daily.                           - Continue Carafate at meals and at bedtime.                           - Observe patient's  clinical course.                           - Await pathology results. This has been biopsied                            on 2 separate occasions and negative for                            dysplasia/malignancy. Query continued radiation                            changes.                           - Repeat upper endoscopy in 3-4 weeks for                            retreatment.                           -  Please monitor and update our team if issues of                            Fevers/Chills/Chest Pain/Abdominal Pain are noted                            in case post-procedural complications are noted.                           - The findings and recommendations were discussed                            with the patient.                           - The findings and recommendations were discussed                            with the patient's family. Procedure Code(s):        --- Professional ---                           [id number], Esophagogastroduodenoscopy, flexible,                            transoral; with insertion of guide wire followed by                            passage of dilator(s) through esophagus over guide                            wire Diagnosis Code(s):        --- Professional ---                           K20.81, Other esophagitis with bleeding                           K44.9, Diaphragmatic hernia without obstruction or                            gangrene                           R13.10, Dysphagia, unspecified CPT copyright 2019 American Medical Association. All rights reserved. The codes documented in this report are preliminary and upon coder review may  be revised to meet current compliance requirements. Justice Britain, MD 05/23/2020 9:22:41 AM Number of Addenda: 0

## 2020-05-23 NOTE — Anesthesia Postprocedure Evaluation (Signed)
Anesthesia Post Note  Patient: [name]  Procedure(s) Performed: ESOPHAGOGASTRODUODENOSCOPY (EGD) WITH PROPOFOL (N/A ) BIOPSY SAVORY DILATION (N/A )     Patient location during evaluation: PACU Anesthesia Type: MAC Level of consciousness: awake and alert Pain management: pain level controlled Vital Signs Assessment: post-procedure vital signs reviewed and stable Respiratory status: spontaneous breathing, nonlabored ventilation, respiratory function stable and patient connected to nasal cannula oxygen Cardiovascular status: stable and blood pressure returned to baseline Postop Assessment: no apparent nausea or vomiting Anesthetic complications: no Comments: Required esmolol during procedure due to HR's > 120.   Back to baseline HR in recovery, MAP > 70. Normal mentation prior to discharge. Recommend home dose of beta blocker after discharge.    No complications documented.  Last Vitals:  Vitals:   05/23/20 0940 05/23/20 0950  BP: 99/62 95/65  Pulse: (!) 110 83  Resp: 20 15  Temp:    SpO2: 92% 97%    Last Pain:  Vitals:   05/23/20 0950  TempSrc:   PainSc: 0-No pain                 Victor Huber

## 2020-05-23 NOTE — Discharge Instructions (Signed)
YOU HAD AN ENDOSCOPIC PROCEDURE TODAY: Refer to the procedure report and other information in the discharge instructions given to you for any specific questions about what was found during the examination. If this information does not answer your questions, please call [place] office at [phone] to clarify.   YOU SHOULD EXPECT: Some feelings of bloating in the abdomen. Passage of more gas than usual. Walking can help get rid of the air that was put into your GI tract during the procedure and reduce the bloating. If you had a lower endoscopy (such as a colonoscopy or flexible sigmoidoscopy) you may notice spotting of blood in your stool or on the toilet paper. Some abdominal soreness may be present for a day or two, also.  DIET: Your first meal following the procedure should be a light meal and then it is ok to progress to your normal diet. A half-sandwich or bowl of soup is an example of a good first meal. Heavy or fried foods are harder to digest and may make you feel nauseous or bloated. Drink plenty of fluids but you should avoid alcoholic beverages for 24 hours. If you had a esophageal dilation, please see attached instructions for diet.    ACTIVITY: Your care partner should take you home directly after the procedure. You should plan to take it easy, moving slowly for the rest of the day. You can resume normal activity the day after the procedure however YOU SHOULD NOT DRIVE, use power tools, machinery or perform tasks that involve climbing or major physical exertion for 24 hours (because of the sedation medicines used during the test).   SYMPTOMS TO REPORT IMMEDIATELY: A gastroenterologist can be reached at any hour. Please call [phone]  for any of the following symptoms:   Following upper endoscopy (EGD, EUS, ERCP, esophageal dilation) Vomiting of blood or coffee ground material  New, significant abdominal pain  New, significant chest pain or pain under the shoulder blades  Painful or  persistently difficult swallowing  New shortness of breath  Black, tarry-looking or red, bloody stools  FOLLOW UP:  If any biopsies were taken you will be contacted by phone or by letter within the next 1-3 weeks. Call [phone]  if you have not heard about the biopsies in 3 weeks.  Please also call with any specific questions about appointments or follow up tests.  

## 2020-05-23 NOTE — Anesthesia Preprocedure Evaluation (Addendum)
Anesthesia Evaluation  Patient identified by MRN, date of birth, ID band Patient awake    Reviewed: Allergy & Precautions, NPO status , Patient's Chart, lab work & pertinent test results, reviewed documented beta blocker date and time   Airway Mallampati: I  TM Distance: >3 FB Neck ROM: Full    Dental  (+) Missing,    Pulmonary COPD,  COPD inhaler, former smoker,    Pulmonary exam normal        Cardiovascular + dysrhythmias Atrial Fibrillation  Rhythm:Irregular Rate:Abnormal     Neuro/Psych PSYCHIATRIC DISORDERS Anxiety Depression negative neurological ROS     GI/Hepatic Neg liver ROS, GERD  Medicated,  Endo/Other  Hypothyroidism   Renal/GU negative Renal ROS     Musculoskeletal  (+) Arthritis ,   Abdominal Normal abdominal exam  (+)   Peds  Hematology negative hematology ROS (+)   Anesthesia Other Findings - HLD  Reproductive/Obstetrics                            Anesthesia Physical Anesthesia Plan  ASA: III  Anesthesia Plan: MAC   Post-op Pain Management:    Induction: Intravenous  PONV Risk Score and Plan: 0 and Propofol infusion  Airway Management Planned: Natural Airway and Simple Face Mask  Additional Equipment: None  Intra-op Plan:   Post-operative Plan:   Informed Consent: I have reviewed the patients History and Physical, chart, labs and discussed the procedure including the risks, benefits and alternatives for the proposed anesthesia with the patient or authorized representative who has indicated his/her understanding and acceptance.       Plan Discussed with: CRNA  Anesthesia Plan Comments:         Anesthesia Quick Evaluation

## 2020-05-23 NOTE — H&P (Signed)
GASTROENTEROLOGY PROCEDURE H&P NOTE   Primary Care Physician: [name], MD  HPI: Victor Huber is a 70 y.o. male who presents for EGD with possible dilation for long esophageal stenosis/ulceration and dysphagia issues.  Past Medical History:  Diagnosis Date  . Adenocarcinoma of left lung, stage 3 ([place]) 08/15/2016  . Allergy   . Anxiety   . Arthritis   . Atrial fibrillation ([place]) 08/15/2016  . Atrial fibrillation ([place])   . Cancer, metastatic to bone ([place])   . Cataract    Waiting to schedule bilateral cataract surgery  . COPD (chronic obstructive pulmonary disease) ([place]) 08/15/2016  . Depression   . Dysphagia   . Dysrhythmia    a-fib  . GERD (gastroesophageal reflux disease)   . Heart murmur    atenlol for A Fib/Eliquis  . History of chemotherapy   . History of radiation therapy   . Hyperlipidemia   . Hypothyroid 08/15/2016  . Longstanding persistent atrial fibrillation ([place]) 08/29/2016  . Pathologic fracture    left femur  . Pneumonitis   . S/P TURP 08/15/2016  . Wears glasses   . Wears hearing aid in both ears    Past Surgical History:  Procedure Laterality Date  . BIOPSY  12/21/2019   Procedure: BIOPSY;  Surgeon: Rush Landmark Telford Nab., MD;  Location: [facility];  Service: Gastroenterology;;  . BRONCHOSCOPY  10/2014  . CARDIAC CATHETERIZATION     05/07/12  . CARDIOVERSION     x2  . COLONOSCOPY     several yrs  . DG BIOPSY LUNG Left 10/2014   FNA - Adenocarcinoma   . ESOPHAGOGASTRODUODENOSCOPY (EGD) WITH PROPOFOL N/A 12/21/2019   Procedure: ESOPHAGOGASTRODUODENOSCOPY (EGD) WITH PROPOFOL;  Surgeon: Rush Landmark Telford Nab., MD;  Location: [place];  Service: Gastroenterology;  Laterality: N/A;  . EUS N/A 12/21/2019   Procedure: UPPER ENDOSCOPIC ULTRASOUND (EUS) RADIAL;  Surgeon: Rush Landmark Telford Nab., MD;  Location: [facility];  Service: Gastroenterology;  Laterality: N/A;  . FEMUR IM NAIL Left 02/19/2017  . FEMUR IM NAIL Left 02/19/2017    Procedure: INTRAMEDULLARY (IM) NAIL FEMORAL;  Surgeon: Marchia Bond, MD;  Location: [place];  Service: Orthopedics;  Laterality: Left;  . IR FLUORO GUIDE PORT INSERTION RIGHT  07/03/2017  . IR US GUIDE VASC ACCESS RIGHT  07/03/2017  . LUNG CANCER SURGERY Left 12/2014   Wedge Resection   . MULTIPLE TOOTH EXTRACTIONS    . Status post TURP    . TONSILLECTOMY     Current Facility-Administered Medications  Medication Dose Route Frequency Provider Last Rate Last Admin  . 0.9 %  sodium chloride infusion   Intravenous Continuous [name], Telford Nab., MD       Allergies  Allergen Reactions  . Carboplatin Itching, Nausea And Vomiting and Other (See Comments)    Flushing  . Flecainide Hypertension    CAUSED HEART ISSUES   . Debrox [Carbamide Peroxide]     Swelling in ear canal.    Family History  Problem Relation Age of Onset  . Breast cancer Mother   . Breast cancer Maternal Aunt   . Breast cancer Maternal Aunt   . Lung disease Neg Hx   . Colon cancer Neg Hx   . Stomach cancer Neg Hx   . Pancreatic cancer Neg Hx   . Rectal cancer Neg Hx   . Colon polyps Neg Hx   . Esophageal cancer Neg Hx    Social History   Socioeconomic History  . Marital status: Married  Spouse name: Not on file  . Number of children: Not on file  . Years of education: Not on file  . Highest education level: Not on file  Occupational History  . Not on file  Tobacco Use  . Smoking status: Former Smoker    Packs/day: 1.00    Years: 50.00    Pack years: 50.00    Types: Cigarettes    Quit date: 08/15/2012    Years since quitting: 7.7  . Smokeless tobacco: Never Used  Vaping Use  . Vaping Use: Never used  Substance and Sexual Activity  . Alcohol use: No  . Drug use: No  . Sexual activity: Not on file  Other Topics Concern  . Not on file  Social History Narrative   [facility] (09/26/16):   Originally from [place]. Moved to [facility] February 2018. Always lived in [place]. Moved to be closer to children &  grandchildren. No international travel. 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 No mold exposure. No bird exposure or hot tub exposure. Enjoys reading. Previously enjoyed wood working with domestic woods.    Social Determinants of Health   Financial Resource Strain: Low Risk   . Difficulty of Paying Living Expenses: Not hard at all  Food Insecurity: No Food Insecurity  . Worried About Charity fundraiser in the Last Year: Never true  . Ran Out of Food in the Last Year: Never true  Transportation Needs: No Transportation Needs  . Lack of Transportation (Medical): No  . Lack of Transportation (Non-Medical): No  Physical Activity: Not on file  Stress: Not on file  Social Connections: Not on file  Intimate Partner Violence: Not on file    Physical Exam: Vital signs in last 24 hours:     GEN: NAD EYE: Sclerae anicteric ENT: MMM CV: Non-tachycardic GI: Soft, NT/ND NEURO:  Alert & Oriented x 3  Lab Results: No results for input(s): WBC, HGB, HCT, PLT in the last 72 hours. BMET No results for input(s): NA, K, CL, CO2, GLUCOSE, BUN, CREATININE, CALCIUM in the last 72 hours. LFT No results for input(s): PROT, ALBUMIN, AST, ALT, ALKPHOS, BILITOT, BILIDIR, IBILI in the last 72 hours. PT/INR No results for input(s): LABPROT, INR in the last 72 hours.   Impression / Plan: This is a 70 y.o.male who presents for EGD with possible dilation for long esophageal stenosis/ulceration and dysphagia issues.  The risks and benefits of endoscopic evaluation were discussed with the patient; these include but are not limited to the risk of perforation, infection, bleeding, missed lesions, lack of diagnosis, severe illness requiring hospitalization, as well as anesthesia and sedation related illnesses.  The patient is agreeable to proceed.    Justice Britain, MD [facility] Office #  [phone]

## 2020-05-25 ENCOUNTER — Encounter (HOSPITAL_COMMUNITY): Payer: Self-pay | Admitting: Gastroenterology

## 2020-05-25 ENCOUNTER — Encounter: Payer: Self-pay | Admitting: Family Medicine

## 2020-05-25 LAB — SURGICAL PATHOLOGY

## 2020-05-26 ENCOUNTER — Other Ambulatory Visit: Payer: Self-pay

## 2020-05-26 ENCOUNTER — Encounter: Payer: Self-pay | Admitting: Family Medicine

## 2020-05-26 ENCOUNTER — Ambulatory Visit: Payer: Medicare HMO | Admitting: Physical Therapy

## 2020-05-26 DIAGNOSIS — M25561 Pain in right knee: Secondary | ICD-10-CM

## 2020-05-26 DIAGNOSIS — M25562 Pain in left knee: Secondary | ICD-10-CM | POA: Diagnosis not present

## 2020-05-26 DIAGNOSIS — M25571 Pain in right ankle and joints of right foot: Secondary | ICD-10-CM | POA: Diagnosis not present

## 2020-05-26 DIAGNOSIS — M25572 Pain in left ankle and joints of left foot: Secondary | ICD-10-CM

## 2020-05-26 MED ORDER — GABAPENTIN 300 MG PO CAPS: 600.0000 mg | ORAL_CAPSULE | Freq: Every evening | ORAL | 3 refills | Status: DC | PRN

## 2020-05-27 ENCOUNTER — Other Ambulatory Visit: Payer: Self-pay

## 2020-05-27 ENCOUNTER — Encounter: Payer: Self-pay | Admitting: Gastroenterology

## 2020-05-27 ENCOUNTER — Telehealth: Payer: Self-pay

## 2020-05-27 MED ORDER — LEVOTHYROXINE SODIUM 200 MCG PO TABS: 200.0000 ug | ORAL_TABLET | Freq: Every day | ORAL | 0 refills | Status: DC

## 2020-05-27 NOTE — Telephone Encounter (Signed)
Rx sent in patient notified 

## 2020-05-27 NOTE — Telephone Encounter (Signed)
Please find a 45-minute slot for EGD dilation in 3 to 4 weeks in the hospital-based setting with fluoroscopy available. Thanks.  GM

## 2020-05-29 ENCOUNTER — Encounter: Payer: Self-pay | Admitting: Physical Therapy

## 2020-05-29 NOTE — Therapy (Signed)
[facility] [address] [place], [place], [address] Phone: [phone]   Fax:  [phone]  Physical Therapy Evaluation  Patient Details  Name: Victor Huber MRN: [id number] Date of Birth: 26-Aug-1950 Referring Provider (PT): [name]   Encounter Date: 05/26/2020   PT End of Session - 05/29/20 1017    Visit Number 1    Number of Visits 12    Date for PT Re-Evaluation 07/07/20    Authorization Type [employer]    PT Start Time 3500    PT Stop Time 1515    PT Time Calculation (min) 40 min    Activity Tolerance Patient tolerated treatment well    Behavior During Therapy [facility] for tasks assessed/performed           Past Medical History:  Diagnosis Date  . Adenocarcinoma of left lung, stage 3 ([place]) 08/15/2016  . Allergy   . Anxiety   . Arthritis   . Atrial fibrillation ([place]) 08/15/2016  . Atrial fibrillation ([place])   . Cancer, metastatic to bone ([place])   . Cataract    Waiting to schedule bilateral cataract surgery  . COPD (chronic obstructive pulmonary disease) ([place]) 08/15/2016  . Depression   . Dysphagia   . Dysrhythmia    a-fib  . GERD (gastroesophageal reflux disease)   . Heart murmur    atenlol for A Fib/Eliquis  . History of chemotherapy   . History of radiation therapy   . Hyperlipidemia   . Hypothyroid 08/15/2016  . Longstanding persistent atrial fibrillation ([place]) 08/29/2016  . Pathologic fracture    left femur  . Pneumonitis   . S/P TURP 08/15/2016  . Wears glasses   . Wears hearing aid in both ears     Past Surgical History:  Procedure Laterality Date  . BIOPSY  12/21/2019   Procedure: BIOPSY;  Surgeon: Rush Landmark Telford Nab., MD;  Location: [facility];  Service: Gastroenterology;;  . BIOPSY  05/23/2020   Procedure: BIOPSY;  Surgeon: Irving Copas., MD;  Location: [facility];  Service: Gastroenterology;;  . BRONCHOSCOPY  10/2014  . CARDIAC CATHETERIZATION     05/07/12  . CARDIOVERSION     x2  .  COLONOSCOPY     several yrs  . DG BIOPSY LUNG Left 10/2014   FNA - Adenocarcinoma   . ESOPHAGOGASTRODUODENOSCOPY (EGD) WITH PROPOFOL N/A 12/21/2019   Procedure: ESOPHAGOGASTRODUODENOSCOPY (EGD) WITH PROPOFOL;  Surgeon: Rush Landmark Telford Nab., MD;  Location: [facility];  Service: Gastroenterology;  Laterality: N/A;  . ESOPHAGOGASTRODUODENOSCOPY (EGD) WITH PROPOFOL N/A 05/23/2020   Procedure: ESOPHAGOGASTRODUODENOSCOPY (EGD) WITH PROPOFOL;  Surgeon: Rush Landmark Telford Nab., MD;  [facility];  Service: Gastroenterology;  Laterality: N/A;  . EUS N/A 12/21/2019   Procedure: UPPER ENDOSCOPIC ULTRASOUND (EUS) RADIAL;  Surgeon: Rush Landmark Telford Nab., MD;  Location: [facility];  Service: Gastroenterology;  Laterality: N/A;  . FEMUR IM NAIL Left 02/19/2017  . FEMUR IM NAIL Left 02/19/2017   Procedure: INTRAMEDULLARY (IM) NAIL FEMORAL;  Surgeon: Marchia Bond, MD;  Location: [facility];  Service: Orthopedics;  Laterality: Left;  . IR FLUORO GUIDE PORT INSERTION RIGHT  07/03/2017  . IR US GUIDE VASC ACCESS RIGHT  07/03/2017  . LUNG CANCER SURGERY Left 12/2014   Wedge Resection   . MULTIPLE TOOTH EXTRACTIONS    . SAVORY DILATION N/A 05/23/2020   Procedure: SAVORY DILATION;  Surgeon: Rush Landmark Telford Nab., MD;  Location: [facility];  Service: Gastroenterology;  Laterality: N/A;  . Status post TURP    .  TONSILLECTOMY      There were no vitals filed for this visit.    Subjective Assessment - 05/29/20 0953    Subjective Pt states new onset of knee pain and ankle pain bilaterally, since November, no injury to report.Marland Kitchen He did have injection in R knee that did not seem to help. States increased pain with standing, walking and stairs, intermmitent with activity. Pt states low energy. He is currently in treatment for stage 4 lung cancer, but had to pause for a short time due to having pancreatitis.Wife also states swelling at bil ankles frequently, not swollen today.    Pertinent History A-fib,  COPD, acute pancreatitis, Stage 4 lung cancer ( in treatment), recent esophagitis    Limitations Standing;Walking;House hold activities    Diagnostic tests recent imaging shows only mild arthritis in bil knees and ankles.    Patient Stated Goals decreased knee and ankle pain    Currently in Pain? Yes    Pain Score 7     Pain Location Knee    Pain Orientation Right;Left    Pain Descriptors / Indicators Aching    Pain Type Acute pain    Pain Onset More than a month ago    Pain Frequency Intermittent    Multiple Pain Sites Yes    Pain Score 5    Pain Location Ankle    Pain Orientation Right;Left    Pain Descriptors / Indicators Aching    Pain Type Acute pain    Pain Onset More than a month ago    Pain Frequency Intermittent              [facility] PT Assessment - 05/29/20 0001      Assessment   Medical Diagnosis Bil knee and bil ankle pain    Referring Provider (PT) [name]    Prior Therapy no      Precautions   Precautions None      Balance Screen   Has the patient fallen in the past 6 months No      Prior Function   Level of Independence Independent      Cognition   Overall Cognitive Status Within Functional Limits for tasks assessed      ROM / Strength   AROM / PROM / Strength AROM;Strength      AROM   Overall AROM Comments Knee ROM: WNL, Hip ROM, mild limitation for all motions;  Ankles: mod limitation for DF bilaterally      Strength   Overall Strength Comments Knee: 4/5, Hips: 4/5, Ankle: 4/5 gross      Palpation   Palpation comment Tenderness at bil knee joint line, and at medial patella, Signifiant limitation for hamstring length bilaterally, mod limitation for quad and gastroc length bilaterally.                      Objective measurements completed on examination: See above findings.       [facility] Adult PT Treatment/Exercise - 05/29/20 0001      Exercises   Exercises Knee/Hip      Knee/Hip Exercises: Stretches   Quad Stretch 3  reps;30 seconds    Quad Stretch Limitations manual /prone    Gastroc Stretch Limitations add next visit      Knee/Hip Exercises: Aerobic   Recumbent Bike add next visit      Knee/Hip Exercises: Seated   Long [employer] 20 reps;Both    Heel Slides 10 reps;Both  Knee/Hip Exercises: Supine   Quad Sets 10 reps;Both    Straight Leg Raises 10 reps;Both      Manual Therapy   Manual Therapy Joint mobilization;Soft tissue mobilization;Passive ROM    Passive ROM manual stretching for quads/ prone, and hamstrings , supine                  PT Education - 05/29/20 1017    Education Details PT POC, exam findings, initial HEP    Person(s) Educated Patient;Spouse    Methods Explanation;Demonstration;Tactile cues;Verbal cues;Handout    Comprehension Verbalized understanding;Returned demonstration;Verbal cues required;Tactile cues required;Need further instruction            PT Short Term Goals - 05/29/20 1352      PT SHORT TERM GOAL #1   Title Pt to be independent with initial HEP    Time 2    Period Weeks    Status New    Target Date 06/09/20             PT Long Term Goals - 05/29/20 1354      PT LONG TERM GOAL #1   Title Pt to be independent with final HEP    Time 6    Status New    Target Date 07/07/20      PT LONG TERM GOAL #2   Title Pt to report decreased pain in bil knees and ankles to 0-3/10 with standing and walking activity, to improve ability for IADLs and community activity    Time 6    Period Weeks    Status New    Target Date 07/07/20      PT LONG TERM GOAL #3   Title Pt to improve strength of hips and knees to at least 4+/5 to improve stability and pain    Time 6    Target Date 07/07/20      PT LONG TERM GOAL #4   Title Pt demo quad and gastroc limitations to be only mild , to improve pain and ROM.    Time 6    Period Weeks    Status New    Target Date 07/07/20                  Plan - 05/29/20 1358    Clinical Impression  Statement Pt presents with primary complaint of increased pain in bil knees and ankles. Pt does have muscle tightness and restrictions that is limiting ROM. Pt with much tightness in bil gastrocs, and quads, and severe limtiation for bil hamstrings. Pt with pain at bil knee joint line and around patellas. Pt with decreased strength of hips and quads, and is overall deconditioned due to recent cancer treatments and decreased activity level. Pt to benefit from skilled PT to improve deficits and pain. Pt to benefit from improving strength and mobility for improved functional mobility level.    Personal Factors and Comorbidities Comorbidity 1;Comorbidity 2    Comorbidities Afib, COPD, stage 4 lung CA, pancreatitis, muliptle pain locations.    Examination-Activity Limitations Locomotion Level;Bend;Squat;Stairs;Lift;Stand    Examination-Participation Restrictions Cleaning;Meal Prep;Yard Work;Community Activity;Shop    Clinical Decision Making Moderate    Rehab Potential Good    PT Frequency 2x / week    PT Duration 6 weeks    PT Treatment/Interventions ADLs/Self Care Home Management;Canalith Repostioning;Cryotherapy;DME Instruction;Moist Heat;Gait training;Stair training;Functional mobility training;Therapeutic activities;Therapeutic exercise;Balance training;Neuromuscular re-education;Patient/family education;Orthotic Fit/Training;Passive range of motion;Manual techniques;Dry needling;Taping;Vasopneumatic Device;Spinal Manipulations;Joint Manipulations    Consulted and Agree with Plan of  Care Patient           Patient will benefit from skilled therapeutic intervention in order to improve the following deficits and impairments:  Pain,Decreased mobility,Increased muscle spasms,Hypomobility,Decreased strength,Decreased range of motion,Decreased endurance,Decreased activity tolerance,Increased edema,Impaired flexibility  Visit Diagnosis: Acute pain of left knee  Acute pain of right knee  Pain in left  ankle and joints of left foot  Pain in right ankle and joints of right foot     Problem List Patient Active Problem List   Diagnosis Date Noted  . Cough 05/18/2020  . Esophagitis 01/31/2020  . Exocrine pancreatic insufficiency 01/31/2020  . Chronic pancreatitis ([place]) 01/31/2020  . Dyspnea 09/09/2019  . Slow transit constipation 08/17/2019  . BPH s/p TURP 03/10/2019  . Encounter for antineoplastic immunotherapy 02/12/2019  . Chronic low back pain 02/03/2019  . Malignant neoplasm of unspecified part of unspecified bronchus or lung ([place]) 02/03/2019  . Port-A-Cath in place 08/22/2017  . Adenocarcinoma of left lung, stage 4 ([place]) 02/26/2017  . Metastasis to bone ([place]) 02/19/2017  . Bone metastasis ([place]), left femur 01/24/2017  . Dyslipidemia 09/26/2016  . Anxiety disorder 09/26/2016  . Chronic atrial fibrillation ([place]) 08/15/2016  . Chronic obstructive pulmonary disease ([place]) 08/15/2016  . Hypothyroid 08/15/2016   Lyndee Hensen, PT, DPT 2:11 PM  05/29/20    [facility] [place], [place], [address] Phone: [phone]   Fax:  [phone]  Name: Victor Huber MRN: [id number] Date of Birth: January 15, 1951

## 2020-05-30 ENCOUNTER — Other Ambulatory Visit: Payer: Self-pay

## 2020-05-30 DIAGNOSIS — K222 Esophageal obstruction: Secondary | ICD-10-CM

## 2020-05-30 NOTE — Telephone Encounter (Signed)
EGD scheduled, pt instructed and medications reviewed.  Patient instructions mailed to home.  Patient to call with any questions or concerns.  

## 2020-05-30 NOTE — Telephone Encounter (Signed)
Patient with diagnosis of afib on Eliquis for anticoagulation.    Procedure: endoscopy Date of procedure: 3/31  CHA2DS2-VASc Score = 2  This indicates a 2.2% annual risk of stroke. The patient's score is based upon: CHF History: No HTN History: No Diabetes History: No Stroke History: No Vascular Disease History: Yes (atherosclerosis noted on CT) Age Score: 1 Gender Score: 0  CrCl >140mL/min Platelet count 262K  Per office protocol, patient can hold Eliquis for 2 days prior to procedure.

## 2020-05-30 NOTE — Telephone Encounter (Signed)
[facility] Pre-operative Risk Assessment     Request for surgical clearance:     Endoscopy Procedure  What type of surgery is being performed?     Endoscopy  When is this surgery scheduled?     3/31  What type of clearance is required ?   Pharmacy  Are there any medications that need to be held prior to surgery and how long? Eliquis   Practice name and name of physician performing surgery?      [facility]  What is your office phone and fax number?      Phone- [phone]  [phone]  Anesthesia type (None, local, MAC, general) ?       MAC

## 2020-05-30 NOTE — Telephone Encounter (Signed)
Will route to PharmD for rec's re: holding anticoagulation. Richardson Dopp, PA-C    05/30/2020 3:50 PM

## 2020-05-30 NOTE — Telephone Encounter (Signed)
The pt has been scheduled for 06/30/20 at 730 am at [facility] with Dr Rush Landmark.  COVID test 3/28 at 1020 am  Dr Rush Landmark the pt has the ok from 10/21 for Eliquis hold.  Is that ok to use or do you want me to get another ok?

## 2020-05-31 DIAGNOSIS — H18413 Arcus senilis, bilateral: Secondary | ICD-10-CM | POA: Diagnosis not present

## 2020-05-31 DIAGNOSIS — H25013 Cortical age-related cataract, bilateral: Secondary | ICD-10-CM | POA: Diagnosis not present

## 2020-05-31 DIAGNOSIS — H2511 Age-related nuclear cataract, right eye: Secondary | ICD-10-CM | POA: Diagnosis not present

## 2020-05-31 DIAGNOSIS — H25043 Posterior subcapsular polar age-related cataract, bilateral: Secondary | ICD-10-CM | POA: Diagnosis not present

## 2020-05-31 DIAGNOSIS — H2513 Age-related nuclear cataract, bilateral: Secondary | ICD-10-CM | POA: Diagnosis not present

## 2020-05-31 NOTE — Telephone Encounter (Signed)
The pt has been advised of the Eliquis hold.  Also, he states that his swallowing only approved a small amount.  He continues to have dysphagia.

## 2020-05-31 NOTE — Telephone Encounter (Signed)
Thank you for the update. Can you please check in with the patient and see how he feels her swallowing is doing now that we have dilated him? Thank you. GM

## 2020-06-09 ENCOUNTER — Encounter: Payer: Medicare HMO | Admitting: Physical Therapy

## 2020-06-13 ENCOUNTER — Encounter: Payer: Medicare HMO | Admitting: Physical Therapy

## 2020-06-16 ENCOUNTER — Encounter: Payer: Self-pay | Admitting: Physical Therapy

## 2020-06-16 ENCOUNTER — Ambulatory Visit: Payer: Medicare HMO | Admitting: Physical Therapy

## 2020-06-16 ENCOUNTER — Encounter: Payer: Medicare HMO | Admitting: Physical Therapy

## 2020-06-16 ENCOUNTER — Other Ambulatory Visit: Payer: Self-pay

## 2020-06-16 DIAGNOSIS — M25562 Pain in left knee: Secondary | ICD-10-CM

## 2020-06-16 DIAGNOSIS — M25571 Pain in right ankle and joints of right foot: Secondary | ICD-10-CM

## 2020-06-16 DIAGNOSIS — M25572 Pain in left ankle and joints of left foot: Secondary | ICD-10-CM | POA: Diagnosis not present

## 2020-06-16 DIAGNOSIS — M25561 Pain in right knee: Secondary | ICD-10-CM | POA: Diagnosis not present

## 2020-06-16 NOTE — Patient Instructions (Signed)
Access Code: [email or web address] URL: [email or web address] Date: 06/16/2020 Prepared by: Lyndee Hensen  Exercises Seated Hamstring Stretch - 2 x daily - 3 reps - 30 hold Gastroc Stretch on Wall - 2 x daily - 3 reps

## 2020-06-18 ENCOUNTER — Encounter: Payer: Self-pay | Admitting: Physical Therapy

## 2020-06-18 NOTE — Therapy (Addendum)
[facility] [address] [place], [place], [address] Phone: [phone]   Fax:  [phone]  Physical Therapy Treatment  Patient Details  Name: Victor Huber MRN: [id number] Date of Birth: [birth year] Referring Provider (PT): [name]   Encounter Date: 06/16/2020   PT End of Session - 06/18/20 2209    Visit Number 2    Number of Visits 12    Date for PT Re-Evaluation 07/07/20    Authorization Type [employer]    PT Start Time 1430    PT Stop Time 1510    PT Time Calculation (min) 40 min    Activity Tolerance Patient tolerated treatment well    Behavior During Therapy [facility] for tasks assessed/performed           Past Medical History:  Diagnosis Date  . Adenocarcinoma of left lung, stage 3 ([place]) 08/15/2016  . Allergy   . Anxiety   . Arthritis   . Atrial fibrillation ([place]) 08/15/2016  . Atrial fibrillation ([place])   . Cancer, metastatic to bone ([place])   . Cataract    Waiting to schedule bilateral cataract surgery  . COPD (chronic obstructive pulmonary disease) ([place]) 08/15/2016  . Depression   . Dysphagia   . Dysrhythmia    a-fib  . GERD (gastroesophageal reflux disease)   . Heart murmur    atenlol for A Fib/Eliquis  . History of chemotherapy   . History of radiation therapy   . Hyperlipidemia   . Hypothyroid 08/15/2016  . Longstanding persistent atrial fibrillation ([place]) 08/29/2016  . Pathologic fracture    left femur  . Pneumonitis   . S/P TURP 08/15/2016  . Wears glasses   . Wears hearing aid in both ears     Past Surgical History:  Procedure Laterality Date  . BIOPSY  12/21/2019   Procedure: BIOPSY;  Surgeon: Rush Landmark Telford Nab., MD;  Location: [facility];  Service: Gastroenterology;;  . BIOPSY  05/23/2020   Procedure: BIOPSY;  Surgeon: Irving Copas., MD;  Location: [facility];  Service: Gastroenterology;;  . BRONCHOSCOPY  10/2014  . CARDIAC CATHETERIZATION     05/07/12  . CARDIOVERSION     x2  .  COLONOSCOPY     several yrs  . DG BIOPSY LUNG Left 10/2014   FNA - Adenocarcinoma   . ESOPHAGOGASTRODUODENOSCOPY (EGD) WITH PROPOFOL N/A 12/21/2019   Procedure: ESOPHAGOGASTRODUODENOSCOPY (EGD) WITH PROPOFOL;  Surgeon: Rush Landmark Telford Nab., MD;  Location: [facility];  Service: Gastroenterology;  Laterality: N/A;  . ESOPHAGOGASTRODUODENOSCOPY (EGD) WITH PROPOFOL N/A 05/23/2020   Procedure: ESOPHAGOGASTRODUODENOSCOPY (EGD) WITH PROPOFOL;  Surgeon: Rush Landmark Telford Nab., MD;  Location: [facility];  Service: Gastroenterology;  Laterality: N/A;  . EUS N/A 12/21/2019   Procedure: UPPER ENDOSCOPIC ULTRASOUND (EUS) RADIAL;  Surgeon: Rush Landmark Telford Nab., MD;  Location: [facility];  Service: Gastroenterology;  Laterality: N/A;  . FEMUR IM NAIL Left 02/19/2017  . FEMUR IM NAIL Left 02/19/2017   Procedure: INTRAMEDULLARY (IM) NAIL FEMORAL;  Surgeon: Marchia Bond, MD;  Location: [facility];  Service: Orthopedics;  Laterality: Left;  . IR FLUORO GUIDE PORT INSERTION RIGHT  07/03/2017  . IR US GUIDE VASC ACCESS RIGHT  07/03/2017  . LUNG CANCER SURGERY Left 12/2014   Wedge Resection   . MULTIPLE TOOTH EXTRACTIONS    . SAVORY DILATION N/A 05/23/2020   Procedure: SAVORY DILATION;  Surgeon: Rush Landmark Telford Nab., MD;  Location: [facility];  Service: Gastroenterology;  Laterality: N/A;  . Status post TURP    .  TONSILLECTOMY      There were no vitals filed for this visit.   Subjective Assessment - 06/18/20 2207    Subjective Pt states slightly less pain in bil knees. Ankles also still sore. Has been doing HEP daily.    Currently in Pain? Yes    Pain Score 4     Pain Location Knee    Pain Orientation Right;Left    Pain Descriptors / Indicators Aching    Pain Type Acute pain    Pain Onset More than a month ago    Pain Frequency Intermittent    Pain Score 5    Pain Location Ankle    Pain Orientation Right;Left    Pain Descriptors / Indicators Aching    Pain Type Acute pain    Pain Onset More  than a month ago    Pain Frequency Intermittent                             [facility] Adult PT Treatment/Exercise - 06/18/20 0001      Exercises   Exercises Knee/Hip      Knee/Hip Exercises: Stretches   Active Hamstring Stretch 3 reps;30 seconds    Active Hamstring Stretch Limitations seated    Quad Stretch 3 reps;30 seconds    Quad Stretch Limitations manual /prone    Gastroc Stretch 30 seconds;3 reps;Both    Gastroc Stretch Limitations at wall      Knee/Hip Exercises: Seated   Long [employer] 20 reps;Both    Long Arc Quad Weight 2 lbs.    Marching 20 reps    Marching Limitations 2 lb      Knee/Hip Exercises: Supine   Quad Sets 10 reps;Both    Straight Leg Raises Both;20 reps      Manual Therapy   Manual Therapy Joint mobilization;Soft tissue mobilization;Passive ROM    Passive ROM manual stretching for quads/ prone, and hamstrings , supine                    PT Short Term Goals - 05/29/20 1352      PT SHORT TERM GOAL #1   Title Pt to be independent with initial HEP    Time 2    Period Weeks    Status New    Target Date 06/09/20             PT Long Term Goals - 05/29/20 1354      PT LONG TERM GOAL #1   Title Pt to be independent with final HEP    Time 6    Status New    Target Date 07/07/20      PT LONG TERM GOAL #2   Title Pt to report decreased pain in bil knees and ankles to 0-3/10 with standing and walking activity, to improve ability for IADLs and community activity    Time 6    Period Weeks    Status New    Target Date 07/07/20      PT LONG TERM GOAL #3   Title Pt to improve strength of hips and knees to at least 4+/5 to improve stability and pain    Time 6    Target Date 07/07/20      PT LONG TERM GOAL #4   Title Pt demo quad and gastroc limitations to be only mild , to improve pain and ROM.    Time 6    Period Weeks  Status New    Target Date 07/07/20                 Plan - 06/18/20 2210    Clinical  Impression Statement Pt with good ability for progressing ther ex today, for ROM and light strengthening, but does have limited endurance. Noted tightness in bil quads and hamstrings. HEP updated and reviewed today.    Personal Factors and Comorbidities Comorbidity 1;Comorbidity 2    Comorbidities Afib, COPD, stage 4 lung CA, pancreatitis, muliptle pain locations.    Examination-Activity Limitations Locomotion Level;Bend;Squat;Stairs;Lift;Stand    Examination-Participation Restrictions Cleaning;Meal Prep;Yard Work;Community Activity;Shop    Rehab Potential Good    PT Frequency 2x / week    PT Duration 6 weeks    PT Treatment/Interventions ADLs/Self Care Home Management;Canalith Repostioning;Cryotherapy;DME Instruction;Moist Heat;Gait training;Stair training;Functional mobility training;Therapeutic activities;Therapeutic exercise;Balance training;Neuromuscular re-education;Patient/family education;Orthotic Fit/Training;Passive range of motion;Manual techniques;Dry needling;Taping;Vasopneumatic Device;Spinal Manipulations;Joint Manipulations    Consulted and Agree with Plan of Care Patient           Patient will benefit from skilled therapeutic intervention in order to improve the following deficits and impairments:  Pain,Decreased mobility,Increased muscle spasms,Hypomobility,Decreased strength,Decreased range of motion,Decreased endurance,Decreased activity tolerance,Increased edema,Impaired flexibility  Visit Diagnosis: Acute pain of left knee  Acute pain of right knee  Pain in left ankle and joints of left foot  Pain in right ankle and joints of right foot     Problem List Patient Active Problem List   Diagnosis Date Noted  . Cough 05/18/2020  . Esophagitis 01/31/2020  . Exocrine pancreatic insufficiency 01/31/2020  . Chronic pancreatitis ([place]) 01/31/2020  . Dyspnea 09/09/2019  . Slow transit constipation 08/17/2019  . BPH s/p TURP 03/10/2019  . Encounter for antineoplastic  immunotherapy 02/12/2019  . Chronic low back pain 02/03/2019  . Malignant neoplasm of unspecified part of unspecified bronchus or lung ([place]) 02/03/2019  . Port-A-Cath in place 08/22/2017  . Adenocarcinoma of left lung, stage 4 ([place]) 02/26/2017  . Metastasis to bone ([place]) 02/19/2017  . Bone metastasis ([place]), left femur 01/24/2017  . Dyslipidemia 09/26/2016  . Anxiety disorder 09/26/2016  . Chronic atrial fibrillation ([place]) 08/15/2016  . Chronic obstructive pulmonary disease ([place]) 08/15/2016  . Hypothyroid 08/15/2016    Lyndee Hensen, PT, DPT 10:13 PM  06/18/20    [facility] [place], [place], [address] Phone: [phone]   Fax:  [phone]  Name: Victor Huber MRN: [id number] Date of Birth: 1951-02-09   PHYSICAL THERAPY DISCHARGE SUMMARY  Visits from Start of Care:   2  Plan: Patient agrees to discharge.  Patient goals were partially met. Patient is being discharged due to not returning since the last visit.  ?????     Lyndee Hensen, PT, DPT 9:36 AM  08/19/20

## 2020-06-20 ENCOUNTER — Encounter: Payer: Medicare HMO | Admitting: Physical Therapy

## 2020-06-23 ENCOUNTER — Encounter: Payer: Medicare HMO | Admitting: Physical Therapy

## 2020-06-24 NOTE — Progress Notes (Deleted)
   I, [name], LAT, ATC acting as a scribe for [name], MD.  RANNY WIEBELHAUS is a 70 y.o. male who presents to [facility] today for f/u chronic bilat knee and ankle pain ongoing since Nov 2021, R>L. Pt was last seen by Dr. [name] on 05/16/20 and was advised to start PT, cont work w/ dietitian to increased protein intake and increasing muscle bulk, and we will work on authorizing hyaluronic acid injections. Since his last visit, pt reports //   Dx imaging: 05/02/20 R & L knee XR             04/16/19 R & L ankle XR  Pertinent review of systems: ***  Relevant historical information: ***   Exam:  There were no vitals taken for this visit. General: Well Developed, well nourished, and in no acute distress.   MSK: ***    Lab and Radiology Results No results found for this or any previous visit (from the past 72 hour(s)). No results found.     Assessment and Plan: 70 y.o. male with ***   PDMP not reviewed this encounter. No orders of the defined types were placed in this encounter.  No orders of the defined types were placed in this encounter.    Discussed warning signs or symptoms. Please see discharge instructions. Patient expresses understanding.   ***

## 2020-06-27 ENCOUNTER — Ambulatory Visit: Payer: Self-pay

## 2020-06-27 ENCOUNTER — Ambulatory Visit: Payer: Medicare HMO | Admitting: Family Medicine

## 2020-06-27 ENCOUNTER — Other Ambulatory Visit (HOSPITAL_COMMUNITY)
Admission: RE | Admit: 2020-06-27 | Discharge: 2020-06-27 | Disposition: A | Discharge status: Home / Self Care | Payer: Medicare HMO | Source: Ambulatory Visit | Attending: Gastroenterology | Admitting: Gastroenterology

## 2020-06-27 ENCOUNTER — Encounter: Payer: Self-pay | Admitting: Family Medicine

## 2020-06-27 ENCOUNTER — Other Ambulatory Visit: Payer: Self-pay

## 2020-06-27 VITALS — BP 94/64 | HR 41 | Ht 73.0 in | Wt 194.4 lb

## 2020-06-27 DIAGNOSIS — Z20822 Contact with and (suspected) exposure to covid-19: Secondary | ICD-10-CM | POA: Insufficient documentation

## 2020-06-27 DIAGNOSIS — M25562 Pain in left knee: Secondary | ICD-10-CM | POA: Diagnosis not present

## 2020-06-27 DIAGNOSIS — Z01812 Encounter for preprocedural laboratory examination: Secondary | ICD-10-CM | POA: Diagnosis not present

## 2020-06-27 DIAGNOSIS — M25572 Pain in left ankle and joints of left foot: Secondary | ICD-10-CM

## 2020-06-27 DIAGNOSIS — G8929 Other chronic pain: Secondary | ICD-10-CM | POA: Diagnosis not present

## 2020-06-27 DIAGNOSIS — M25561 Pain in right knee: Secondary | ICD-10-CM

## 2020-06-27 DIAGNOSIS — M25571 Pain in right ankle and joints of right foot: Secondary | ICD-10-CM

## 2020-06-27 DIAGNOSIS — M25531 Pain in right wrist: Secondary | ICD-10-CM

## 2020-06-27 LAB — SARS CORONAVIRUS 2 (TAT 6-24 HRS): SARS Coronavirus 2: NEGATIVE

## 2020-06-27 NOTE — Patient Instructions (Addendum)
Good to see you today.  You had B knee Orthovisc injections and right wrist injection today.  Call or go to the ER if you develop a large red swollen joint with extreme pain or oozing puss.   Please use voltaren gel up to 4x daily for pain as needed.   You should hear from MRI scheduling within 1 week. If you do not hear please let me know.   Return in 1 weeks for orthovisc injection #2.

## 2020-06-27 NOTE — Progress Notes (Signed)
I, [name], LAT, ATC, am serving as scribe for Dr. [name].  Victor Huber is a 70 y.o. male who presents to [place] [facility] today for f/u of B knee and ankle pain.  He was last seen by Dr. [name] on 05/16/20 and was advised to go to PT of which he has now completed 2 visits.  He was also prescribed a refill of Gabapentin. Since his last visit, pt reports that his knees and ankles are about the same, maybe slightly better.  He states that he does not have any more PT sessions scheduled but notes that he has been doing his HEP.  He con't to take the Gabapentin.  He reports new R wrist pain and swelling at his L finger.  His R wrist began bothering him after doing some hammering about 2-3 weeks ago.  Diagnostic imaging: R and L knee XR- 05/02/20; R and L ankle XR- 04/15/20  Pertinent review of systems: No fevers or chills  Relevant historical information: History of lung cancer stage IV currently suppressed with immunotherapy. History of bone metastasis from the lung cancer.   Exam:  BP 94/64 (BP Location: Right Arm, Patient Position: Sitting, Cuff Size: Normal)   Pulse (!) 41   Ht 6\' 1"  (1.854 m)   Wt 194 lb 6.4 oz (88.2 kg)   SpO2 97%   BMI 25.65 kg/m  General: Well Developed, well nourished, and in no acute distress.   MSK: Right wrist large effusion.  Decreased wrist motion. Knees bilaterally decreased quadricep bulk.  Mild knee effusion.  Normal knee motion. Ankles bilaterally mild effusion decreased motion.    Lab and Radiology Results  Procedure: Real-time Ultrasound Guided Injection of right wrist dorsal approach Device: Philips Affiniti 50G Images permanently stored and available for review in PACS Large joint effusion seen on ultrasound prior to injection Verbal informed consent obtained.  Discussed risks and benefits of procedure. Warned about infection bleeding damage to structures skin hypopigmentation and fat atrophy among  others. Patient expresses understanding and agreement Time-out conducted.   Noted no overlying erythema, induration, or other signs of local infection.   Skin prepped in a sterile fashion.   Local anesthesia: Topical Ethyl chloride.   With sterile technique and under real time ultrasound guidance:  40 mg of Kenalog and 2 mL of Marcaine injected into wrist joint. Fluid seen entering the joint capsule.   Completed without difficulty   Pain immediately resolved suggesting accurate placement of the medication.   Advised to call if fevers/chills, erythema, induration, drainage, or persistent bleeding.   Images permanently stored and available for review in the ultrasound unit.  Impression: Technically successful ultrasound guided injection.    Procedure: Real-time Ultrasound Guided Injection of right knee Orthovisc injection 1/3 Device: Philips Affiniti 50G Images permanently stored and available for review in PACS Verbal informed consent obtained.  Discussed risks and benefits of procedure. Warned about infection bleeding damage to structures skin hypopigmentation and fat atrophy among others. Patient expresses understanding and agreement Time-out conducted.   Noted no overlying erythema, induration, or other signs of local infection.   Skin prepped in a sterile fashion.   Local anesthesia: Topical Ethyl chloride.   With sterile technique and under real time ultrasound guidance:  Orthovisc 30 mg injected into knee joint superior lateral patellar space. Fluid seen entering the joint capsule.   Completed without difficulty   Advised to call if fevers/chills, erythema, induration, drainage, or persistent bleeding.  Images permanently stored and available for review in the ultrasound unit.  Impression: Technically successful ultrasound guided injection.     Procedure: Real-time Ultrasound Guided Injection of left knee Orthovisc 1/3 Device: Philips Affiniti 50G Images permanently stored  and available for review in PACS Verbal informed consent obtained.  Discussed risks and benefits of procedure. Warned about infection bleeding damage to structures skin hypopigmentation and fat atrophy among others. Patient expresses understanding and agreement Time-out conducted.   Noted no overlying erythema, induration, or other signs of local infection.   Skin prepped in a sterile fashion.   Local anesthesia: Topical Ethyl chloride.   With sterile technique and under real time ultrasound guidance:  Orthovisc 30 mg injected into left knee superior lateral patellar space. Fluid seen entering the joint capsule.   Completed without difficulty    Advised to call if fevers/chills, erythema, induration, drainage, or persistent bleeding.   Images permanently stored and available for review in the ultrasound unit.  Impression: Technically successful ultrasound guided injection.    EXAM: LEFT ANKLE - 2 VIEW  COMPARISON:  Same-day right ankle radiographs.  FINDINGS: There is no evidence of fracture or dislocation. Small joint effusion. Mild degenerative change of the ankle joint. Mild dorsal midfoot degenerative change. Calcaneal enthesophytes. Vascular calcifications.  IMPRESSION: No acute fracture or dislocation. Mild degenerative changes of the ankle joint and dorsal midfoot. Small ankle joint effusion.   Electronically Signed   By: Dahlia Bailiff MD   On: 04/15/2020 19:20   EXAM: RIGHT ANKLE - 2 VIEW  COMPARISON:  Same day left ankle radiographs.  FINDINGS: There is no evidence of fracture or dislocation. Small ankle joint effusion. Mild dorsal midfoot degenerative change. Calcaneal enthesophytes. Vascular calcifications.  IMPRESSION: No acute osseous abnormality in the right ankle. Small ankle joint effusion and mild dorsal midfoot degenerative change.   Electronically Signed   By: Dahlia Bailiff MD   On: 04/15/2020 19:22  I, [name], personally  (independently) visualized and performed the interpretation of the xray images attached in this note.      Assessment and Plan: 70 y.o. male with multiple joint pain.  Right wrist pain and left finger pain occurring acutely after doing some chiseling with a hammer.  This is likely a flareup or overuse type issue.  This is a more acute process and likely will improve.  Plan to proceed with steroid injection right wrist and Voltaren gel right wrist and left finger and recheck back in the near future.  Bilateral ankle pain: This is a longer chronic issue.  This is been ongoing for months and failing conservative management including trial of physical therapy.  X-rays obtained in January did show some degenerative changes but not enough to explain the severity of his pain.  He does have a cancer history and after discussion with patient neck step for work-up for this would be MRI both ankles to further evaluate cause of pain.  Plan to proceed with MRI and recheck following MRI.  Knee pain thought to be due to DJD.  Failing conservative management as well.  At this point neck step is trial of hyaluronic acid injections.  He has had approval for Orthovisc.  We will start Orthovisc series now.  Schedule next week for Orthovisc injection 2/3.    PDMP not reviewed this encounter. Orders Placed This Encounter  Procedures  . [place] LIMITED JOINT SPACE STRUCTURES LOW BILAT(NO LINKED CHARGES)    Order Specific Question:   Reason for Exam (SYMPTOM  OR DIAGNOSIS REQUIRED)    Answer:   B knee pain    Order Specific Question:   Preferred imaging location?    Answer:   [place]  . MR ANKLE RIGHT WO CONTRAST    Standing Status:   Future    Standing Expiration Date:   06/27/2021    Order Specific Question:   What is the patient's sedation requirement?    Answer:   No Sedation    Order Specific Question:   Does the patient have a pacemaker or implanted devices?    Answer:   No     Order Specific Question:   Preferred imaging location?    Answer:   [facility]process development scientist (table limit-350lbs)  . MR ANKLE LEFT WO CONTRAST    Standing Status:   Future    Standing Expiration Date:   06/27/2021    Order Specific Question:   What is the patient's sedation requirement?    Answer:   No Sedation    Order Specific Question:   Does the patient have a pacemaker or implanted devices?    Answer:   No    Order Specific Question:   Preferred imaging location?    Answer:   Product/process development scientist (table limit-350lbs)   No orders of the defined types were placed in this encounter.    Discussed warning signs or symptoms. Please see discharge instructions. Patient expresses understanding.   The above documentation has been reviewed and is accurate and complete [name], M.D.

## 2020-06-29 ENCOUNTER — Encounter (HOSPITAL_COMMUNITY): Payer: Self-pay | Admitting: Gastroenterology

## 2020-06-29 ENCOUNTER — Other Ambulatory Visit: Payer: Self-pay

## 2020-06-29 NOTE — Progress Notes (Signed)
Anesthesia Chart Review: SAME DAY WORK-UP   Case: [id number] Date/Time: 06/30/20 0730   Procedures:      ESOPHAGOGASTRODUODENOSCOPY (EGD) WITH PROPOFOL (N/A ) - ultra slim scope avail     SAVORY DILATION (N/A )   Anesthesia type: Monitor Anesthesia Care   Pre-op diagnosis: esophageal stricture   Location: [facility] ROOM [facility] / [facility]   Surgeons: [name], Telford Nab., MD      DISCUSSION: Patient is a 70 year old male scheduled for the above procedure. Previous EGD 05/23/20 and 12/21/19.   History includes former smoker (quit 08/15/12), afib (diagnosed ~ 2012; s/p unsuccessful DCCV X 2; WCT/cardiac arrest on flecainide s/p defibrillation '14) , COPD, TURP, hypothyroidism, HLD, non-small cell lung cancer (diagnosed '16; s/p LLL wedge resection with residual metastatic mediastinal lymphadenopathy that could not be resected; s/p chemoradiation; 01/12/17 PET scan showing metastatic disease in the left femur, left supraclavicular and right paratracheal nodes, s/p palliative radiotherapy to left femur), pancreatitis (~ 12/2019).  Last Eliquis 06/28/2020.  Preprocedure COVID-19 test - 06/27/2020.  Anesthesia team to evaluate on the day of procedure.   VS: Ht 6\' 1"  (1.854 m)   Wt 88 kg   BMI 25.60 kg/m   BP Readings from Last 3 Encounters:  06/27/20 94/64  05/23/20 95/65  05/18/20 108/68   Pulse Readings from Last 3 Encounters:  06/27/20 (!) 41  05/23/20 83  05/18/20 62    PROVIDERS: [name], MD is PCP  Marella Bile, MD is cardiologist. Last visit 03/18/20.  Thompson Grayer, MD is EP cardiologist. Last visit 10/31/16.  Curt Bears, MD is HEM-ONC. Last visit 04/14/20.  Current therapy with Keytruda. Kyung Rudd, MD is RAD-ONC Baltazar Apo, MD is pulmonologist. Last visit 05/18/20.   LABS: Per GI/Anesthesia on day of procedure.     PFTs 04/10/16 [facility]; scanned under Results Review tab): FVC 3.70 (79%), FEV1 2.36 (64%), DLCO 27.4 (59%).    IMAGES: CT  Chest/abd/pelvis 04/12/20: IMPRESSION: 1. Similar appearance of surgical changes of left lower lobe wedge resection. Surrounding similar radiation induced fibrosis. 2. New 8 mm more inferior and medial left lower lobe pulmonary nodule. Cannot exclude isolated metastatic disease or metachronous primary. Consider chest CT follow-up at 3-6 months. This is at the low end of PET resolution and likely too small to biopsy. 3. Similar borderline thoracic adenopathy. 4. No evidence of abdominopelvic metastatic disease. 5. Increase in small left pleural effusion. 6. Cholelithiasis. 7.  Possible constipation. 8. Aortic atherosclerosis (ICD10-I70.0), coronary artery atherosclerosis and emphysema (ICD10-J43.9). 9. New trace pelvic fluid, of indeterminate etiology.   EKG: 03/18/20 ([facility]): Atrial fibrillation with premature ventricular or aberrantly conducted complexes.  Ventricular rate 83 bpm.  Nonspecific intraventricular conduction delay.  Nonspecific T wave abnormality.    CV: Nuclear stress test 03/30/20:  Defect 1: There is a small defect of mild severity present in the basal inferolateral and mid inferolateral location that improves with stress suggestive of artifact.  Defect 2: There is a small defect of mild severity present in the apical inferior and apex location in rest and stress- infarct is on differential.  Defect 3: There is a small defect of mild severity present in the basal inferoseptal and basal inferior location in rest and stress- infarct is on differential.  This is an intermediate risk study given multiple small defects.  Findings consistent with prior myocardial infarction or sub diaphragmatic attenuation (unable to assess gating of walls in the setting of atrial fibrillation). Possible inferior infarct.  Intermediate risk study. -  Dr. Oval Linsey reviewed 04/12/20, "Stress test shows some artifact but there is no problem with blocked arteries that is causing his  shortness of breath."   TEE 01/02/16 [facility]; scanned under Results Review tab):  Normal left ventricular chamber size. Mildly reduced left ventricular systolic function. Very mild hypokinesis. Left ventricular ejection fraction is 45-50% visually. Large right ventricular size. Normal right ventricular systolic function. Moderately dilated right atrium. Mildly dilated left atrium. Normal left atrial appendage. Normal intra-atrial septum. Bubble study performed with no sign of shunt. Trace mitral regurgitation. Trace aortic insufficiency. Trace tricuspid regurgitation. Normal pericardium without effusion. Normal aortic root. No cardiac source of embolism seen.   Echo 06/10/15 [facility]; scanned under Results Review tab): Conclusions: Technically difficult study. Normal left ventricular chamber size. Mild concentric left ventricular hypertrophy. Left ventricular ejection fraction is 50-55%. Trace mitral regurgitation. Mitral valve opens normally. Right heart pressures were normal.   48 Holter monitor 01/30/16 [facility]; scanned under Results Review tab): The patient was in atrial fibrillation throughout with an average ventricular rate of 92 bpm (range from 45-185). The longest pause was 2.02 seconds. Ventricular ectopy consisted of rare PVCs averaging 2.2 per hour. There was one ventricular couplet. No episodes of wide-complex tachycardia. Complaints of "shortness of breath" were associated with atrial fibrillation with ventricular rates from 118-1 77 bpm.   ETT 05/12/12 [facility]; scanned under Results Review tab): Final impression: Stress test negative for any significant arrhythmias and negative for ischemia.   Cardiac cath 05/07/12 (done for cardiac arrest; [facility]; scanned under Results Review tab):  Left main: Normal. LAD: Luminal irregularities including a diagonal. CX: Codominant. Circumflex and ramus contain luminal irregularities. RCA: Codominant.  Luminal irregularities. Conclusion: Nonobstructive coronary atherosclerosis.    Past Medical History:  Diagnosis Date  . Adenocarcinoma of left lung, stage 3 ([place]) 08/15/2016  . Allergy   . Anxiety   . Arthritis   . Atrial fibrillation ([place]) 08/15/2016  . Atrial fibrillation ([place])   . Cancer, metastatic to bone ([place])   . Cataract    Waiting to schedule bilateral cataract surgery  . COPD (chronic obstructive pulmonary disease) ([place]) 08/15/2016  . Depression   . Dysphagia   . Dysrhythmia    a-fib  . GERD (gastroesophageal reflux disease)   . Heart murmur    atenlol for A Fib/Eliquis  . History of chemotherapy   . History of radiation therapy   . Hyperlipidemia   . Hypothyroid 08/15/2016  . Longstanding persistent atrial fibrillation ([place]) 08/29/2016  . Pathologic fracture    left femur  . Pneumonitis   . S/P TURP 08/15/2016  . Wears glasses   . Wears hearing aid in both ears     Past Surgical History:  Procedure Laterality Date  . BIOPSY  12/21/2019   Procedure: BIOPSY;  Surgeon: Rush Landmark Telford Nab., MD;  Location: [place];  Service: Gastroenterology;;  . BIOPSY  05/23/2020   Procedure: BIOPSY;  Surgeon: Irving Copas., MD;  Location: [facility];  Service: Gastroenterology;;  . BRONCHOSCOPY  10/2014  . CARDIAC CATHETERIZATION     05/07/12  . CARDIOVERSION     x2  . COLONOSCOPY     several yrs  . DG BIOPSY LUNG Left 10/2014   FNA - Adenocarcinoma   . ESOPHAGOGASTRODUODENOSCOPY (EGD) WITH PROPOFOL N/A 12/21/2019   Procedure: ESOPHAGOGASTRODUODENOSCOPY (EGD) WITH PROPOFOL;  Surgeon: Rush Landmark Telford Nab., MD;  Location: [place];  Service: Gastroenterology;  Laterality: N/A;  . ESOPHAGOGASTRODUODENOSCOPY (EGD) WITH PROPOFOL N/A 05/23/2020  Procedure: ESOPHAGOGASTRODUODENOSCOPY (EGD) WITH PROPOFOL;  Surgeon: Rush Landmark Telford Nab., MD;  Location: [facility];  Service: Gastroenterology;  Laterality: N/A;  . EUS N/A 12/21/2019   Procedure: UPPER  ENDOSCOPIC ULTRASOUND (EUS) RADIAL;  Surgeon: Rush Landmark Telford Nab., MD;  Location: [place];  Service: Gastroenterology;  Laterality: N/A;  . FEMUR IM NAIL Left 02/19/2017  . FEMUR IM NAIL Left 02/19/2017   Procedure: INTRAMEDULLARY (IM) NAIL FEMORAL;  Surgeon: Marchia Bond, MD;  Location: [place];  Service: Orthopedics;  Laterality: Left;  . IR FLUORO GUIDE PORT INSERTION RIGHT  07/03/2017  . IR US GUIDE VASC ACCESS RIGHT  07/03/2017  . LUNG CANCER SURGERY Left 12/2014   Wedge Resection   . MULTIPLE TOOTH EXTRACTIONS    . SAVORY DILATION N/A 05/23/2020   Procedure: SAVORY DILATION;  Surgeon: Rush Landmark Telford Nab., MD;  Location: [facility];  Service: Gastroenterology;  Laterality: N/A;  . Status post TURP    . TONSILLECTOMY      MEDICATIONS: No current facility-administered medications for this encounter.   Marland Kitchen acetaminophen (TYLENOL) 500 MG tablet  . albuterol (VENTOLIN HFA) 108 (90 Base) MCG/ACT inhaler  . atenolol (TENORMIN) 50 MG tablet  . atorvastatin (LIPITOR) 40 MG tablet  . ELIQUIS 5 MG TABS tablet  . esomeprazole (NEXIUM) 40 MG capsule  . fentaNYL (DURAGESIC) 25 MCG/HR  . gabapentin (NEURONTIN) 300 MG capsule  . ibuprofen (ADVIL,MOTRIN) 400 MG tablet  . levothyroxine (SYNTHROID) 200 MCG tablet  . lipase/protease/amylase (CREON) 36000 UNITS CPEP capsule  . Oxycodone HCl 10 MG TABS  . sodium chloride (OCEAN) 0.65 % SOLN nasal spray  . sucralfate (CARAFATE) 1 g tablet  . tamsulosin (FLOMAX) 0.4 MG CAPS capsule  . venlafaxine XR (EFFEXOR-XR) 37.5 MG 24 hr capsule    Myra Gianotti, PA-C Surgical Short Stay/Anesthesiology [facility] Phone [phone] [facility] Phone [phone] 06/29/2020 4:16 PM

## 2020-06-29 NOTE — Progress Notes (Signed)
PCP - Dimas Chyle, MD Cardiologist - Skeet Latch, MD  Chest x-ray -  EKG - 03/18/20 Stress Test -  ECHO - 01/02/16 Cardiac Cath - 05/08/15  Blood Thinner Instructions: Eliquis LAST DOSE 06/28/20  Aspirin Instructions:   ERAS Protcol -   COVID TEST- 06/27/20 negative   Anesthesia review: yes   -------------  SDW INSTRUCTIONS:  Your procedure is scheduled on 06/30/20. Please report to [facility] Main Entrance "A" at 0600 A.M., and check in at the Admitting office. Call this number if you have problems the morning of surgery: [phone]   Remember: Do not eat or drink after midnight the night before your surgery  Medications to take morning of surgery with a sip of water include: acetaminophen (TYLENOL) if needed Oxycodone if needed albuterol (VENTOLIN HFA) ---- Please bring all inhalers with you the day of surgery.  atenolol (TENORMIN) atorvastatin (LIPITOR)  esomeprazole (NEXIUM) levothyroxine (SYNTHROID)  venlafaxine XR (EFFEXOR-XR)    As of today, STOP taking any Aspirin (unless otherwise instructed by your surgeon), Aleve, Naproxen, Ibuprofen, Motrin, Advil, Goody's, BC's, all herbal medications, fish oil, and all vitamins.    The Morning of Surgery Do not wear jewelry Do not wear lotions, powders, colognes, or deodorant Men may shave face and neck. Do not bring valuables to the hospital. [facility] is not responsible for any belongings or valuables. If you are a smoker, DO NOT Smoke 24 hours prior to surgery If you wear a CPAP at night please bring your mask the morning of surgery  Remember that you must have someone to transport you home after your surgery, and remain with you for 24 hours if you are discharged the same day. Please bring cases for contacts, glasses, hearing aids, dentures or bridgework because it cannot be worn into surgery.   Patients discharged the day of surgery will not be allowed to drive home.   Please shower the NIGHT BEFORE SURGERY  and the MORNING OF SURGERY with DIAL Soap. Wear comfortable clothes the morning of surgery. Oral Hygiene is also important to reduce your risk of infection.  Remember - BRUSH YOUR TEETH THE MORNING OF SURGERY WITH YOUR REGULAR TOOTHPASTE  Patient denies shortness of breath, fever, cough and chest pain.

## 2020-06-29 NOTE — Anesthesia Preprocedure Evaluation (Addendum)
Anesthesia Evaluation  Patient identified by MRN, date of birth, ID band Patient awake    Reviewed: Allergy & Precautions, NPO status , Patient's Chart, lab work & pertinent test results, reviewed documented beta blocker date and time   Airway Mallampati: I  TM Distance: >3 FB Neck ROM: Full    Dental  (+) Caps, Missing, Dental Advisory Given, Poor Dentition,    Pulmonary shortness of breath and with exertion, COPD,  COPD inhaler, former smoker,  Lung Ca Stage 4 with metastasis to bone mundergoing ChemoRx   Pulmonary exam normal breath sounds clear to auscultation       Cardiovascular Normal cardiovascular exam+ dysrhythmias Atrial Fibrillation + Valvular Problems/Murmurs  Rhythm:Irregular Rate:Normal  EKG 03/18/20 Atrial fibrillation, nonspecific IVCD   Neuro/Psych PSYCHIATRIC DISORDERS Anxiety Depression negative neurological ROS     GI/Hepatic Neg liver ROS, GERD  Medicated and Controlled,Esophageal stricture Chronic pancreatitis   Endo/Other  Hypothyroidism Hyperlipidemia  Renal/GU negative Renal ROS   BPH S/P TURP    Musculoskeletal  (+) Arthritis , Osteoarthritis,  Chronic back pain Metastatic Bone disease- left femur   Abdominal   Peds  Hematology  (+) anemia , Eliquis therapy- last dose 3/29   Anesthesia Other Findings   Reproductive/Obstetrics                         Anesthesia Physical Anesthesia Plan  ASA: III  Anesthesia Plan: General   Post-op Pain Management:    Induction: Intravenous  PONV Risk Score and Plan: 2 and Treatment may vary due to age or medical condition and Propofol infusion  Airway Management Planned: Natural Airway and Nasal Cannula  Additional Equipment:   Intra-op Plan:   Post-operative Plan:   Informed Consent: I have reviewed the patients History and Physical, chart, labs and discussed the procedure including the risks, benefits and  alternatives for the proposed anesthesia with the patient or authorized representative who has indicated his/her understanding and acceptance.     Dental advisory given  Plan Discussed with: CRNA and Anesthesiologist  Anesthesia Plan Comments: (PAT note written 06/29/2020 by Myra Gianotti, PA-C. )      Anesthesia Quick Evaluation                                  Anesthesia Evaluation  Patient identified by MRN, date of birth, ID band Patient awake    Reviewed: Allergy & Precautions, NPO status , Patient's Chart, lab work & pertinent test results, reviewed documented beta blocker date and time   Airway Mallampati: I  TM Distance: >3 FB Neck ROM: Full    Dental  (+) Missing,    Pulmonary COPD,  COPD inhaler, former smoker,    Pulmonary exam normal        Cardiovascular + dysrhythmias Atrial Fibrillation  Rhythm:Irregular Rate:Abnormal     Neuro/Psych PSYCHIATRIC DISORDERS Anxiety Depression negative neurological ROS     GI/Hepatic Neg liver ROS, GERD  Medicated,  Endo/Other  Hypothyroidism   Renal/GU negative Renal ROS     Musculoskeletal  (+) Arthritis ,   Abdominal Normal abdominal exam  (+)   Peds  Hematology negative hematology ROS (+)   Anesthesia Other Findings - HLD  Reproductive/Obstetrics                            Anesthesia Physical Anesthesia Plan  ASA: III  Anesthesia Plan: MAC   Post-op Pain Management:    Induction: Intravenous  PONV Risk Score and Plan: 0 and Propofol infusion  Airway Management Planned: Natural Airway and Simple Face Mask  Additional Equipment: None  Intra-op Plan:   Post-operative Plan:   Informed Consent: I have reviewed the patients History and Physical, chart, labs and discussed the procedure including the risks, benefits and alternatives for the proposed anesthesia with the patient or authorized representative who has indicated his/her understanding and  acceptance.       Plan Discussed with: CRNA  Anesthesia Plan Comments:         Anesthesia Quick Evaluation

## 2020-06-30 ENCOUNTER — Ambulatory Visit (HOSPITAL_COMMUNITY)
Admission: RE | Admit: 2020-06-30 | Discharge: 2020-06-30 | Disposition: A | Discharge status: Home / Self Care | Payer: Medicare HMO | Attending: Gastroenterology | Admitting: Gastroenterology

## 2020-06-30 ENCOUNTER — Ambulatory Visit (HOSPITAL_COMMUNITY): Payer: Medicare HMO | Admitting: Vascular Surgery

## 2020-06-30 ENCOUNTER — Encounter: Payer: Medicare HMO | Admitting: Physical Therapy

## 2020-06-30 ENCOUNTER — Encounter (HOSPITAL_COMMUNITY)
Admission: RE | Disposition: A | Discharge status: Home / Self Care | Payer: Self-pay | Source: Home / Self Care | Attending: Gastroenterology

## 2020-06-30 ENCOUNTER — Ambulatory Visit (HOSPITAL_COMMUNITY): Payer: Medicare HMO

## 2020-06-30 ENCOUNTER — Telehealth: Payer: Self-pay

## 2020-06-30 ENCOUNTER — Encounter (HOSPITAL_COMMUNITY): Payer: Self-pay | Admitting: Gastroenterology

## 2020-06-30 DIAGNOSIS — C7951 Secondary malignant neoplasm of bone: Secondary | ICD-10-CM | POA: Insufficient documentation

## 2020-06-30 DIAGNOSIS — E039 Hypothyroidism, unspecified: Secondary | ICD-10-CM | POA: Insufficient documentation

## 2020-06-30 DIAGNOSIS — R1310 Dysphagia, unspecified: Secondary | ICD-10-CM | POA: Insufficient documentation

## 2020-06-30 DIAGNOSIS — J439 Emphysema, unspecified: Secondary | ICD-10-CM | POA: Insufficient documentation

## 2020-06-30 DIAGNOSIS — Y842 Radiological procedure and radiotherapy as the cause of abnormal reaction of the patient, or of later complication, without mention of misadventure at the time of the procedure: Secondary | ICD-10-CM | POA: Insufficient documentation

## 2020-06-30 DIAGNOSIS — Z888 Allergy status to other drugs, medicaments and biological substances status: Secondary | ICD-10-CM | POA: Diagnosis not present

## 2020-06-30 DIAGNOSIS — J90 Pleural effusion, not elsewhere classified: Secondary | ICD-10-CM | POA: Insufficient documentation

## 2020-06-30 DIAGNOSIS — Z923 Personal history of irradiation: Secondary | ICD-10-CM | POA: Diagnosis not present

## 2020-06-30 DIAGNOSIS — Z8674 Personal history of sudden cardiac arrest: Secondary | ICD-10-CM | POA: Insufficient documentation

## 2020-06-30 DIAGNOSIS — I700 Atherosclerosis of aorta: Secondary | ICD-10-CM | POA: Insufficient documentation

## 2020-06-30 DIAGNOSIS — Z85118 Personal history of other malignant neoplasm of bronchus and lung: Secondary | ICD-10-CM | POA: Diagnosis not present

## 2020-06-30 DIAGNOSIS — Z7901 Long term (current) use of anticoagulants: Secondary | ICD-10-CM | POA: Diagnosis not present

## 2020-06-30 DIAGNOSIS — K2100 Gastro-esophageal reflux disease with esophagitis, without bleeding: Secondary | ICD-10-CM | POA: Insufficient documentation

## 2020-06-30 DIAGNOSIS — I2510 Atherosclerotic heart disease of native coronary artery without angina pectoris: Secondary | ICD-10-CM | POA: Insufficient documentation

## 2020-06-30 DIAGNOSIS — K449 Diaphragmatic hernia without obstruction or gangrene: Secondary | ICD-10-CM | POA: Diagnosis not present

## 2020-06-30 DIAGNOSIS — Z803 Family history of malignant neoplasm of breast: Secondary | ICD-10-CM | POA: Diagnosis not present

## 2020-06-30 DIAGNOSIS — Z79899 Other long term (current) drug therapy: Secondary | ICD-10-CM | POA: Insufficient documentation

## 2020-06-30 DIAGNOSIS — K8020 Calculus of gallbladder without cholecystitis without obstruction: Secondary | ICD-10-CM | POA: Diagnosis not present

## 2020-06-30 DIAGNOSIS — K3189 Other diseases of stomach and duodenum: Secondary | ICD-10-CM | POA: Diagnosis not present

## 2020-06-30 DIAGNOSIS — Z9079 Acquired absence of other genital organ(s): Secondary | ICD-10-CM | POA: Diagnosis not present

## 2020-06-30 DIAGNOSIS — C778 Secondary and unspecified malignant neoplasm of lymph nodes of multiple regions: Secondary | ICD-10-CM | POA: Insufficient documentation

## 2020-06-30 DIAGNOSIS — I4891 Unspecified atrial fibrillation: Secondary | ICD-10-CM | POA: Diagnosis not present

## 2020-06-30 DIAGNOSIS — E785 Hyperlipidemia, unspecified: Secondary | ICD-10-CM | POA: Insufficient documentation

## 2020-06-30 DIAGNOSIS — Z87891 Personal history of nicotine dependence: Secondary | ICD-10-CM | POA: Diagnosis not present

## 2020-06-30 DIAGNOSIS — J449 Chronic obstructive pulmonary disease, unspecified: Secondary | ICD-10-CM | POA: Diagnosis not present

## 2020-06-30 DIAGNOSIS — K209 Esophagitis, unspecified without bleeding: Secondary | ICD-10-CM

## 2020-06-30 DIAGNOSIS — Z9221 Personal history of antineoplastic chemotherapy: Secondary | ICD-10-CM | POA: Insufficient documentation

## 2020-06-30 DIAGNOSIS — K222 Esophageal obstruction: Secondary | ICD-10-CM

## 2020-06-30 HISTORY — PX: ESOPHAGOGASTRODUODENOSCOPY (EGD) WITH PROPOFOL: SHX5813

## 2020-06-30 HISTORY — PX: SAVORY DILATION: SHX5439

## 2020-06-30 SURGERY — ESOPHAGOGASTRODUODENOSCOPY (EGD) WITH PROPOFOL
Anesthesia: General

## 2020-06-30 MED ORDER — ONDANSETRON HCL 4 MG/2ML IJ SOLN
INTRAMUSCULAR | Status: DC | PRN
  Administered 2020-06-30: 4 mg via INTRAVENOUS

## 2020-06-30 MED ORDER — LACTATED RINGERS IV SOLN
INTRAVENOUS | Status: DC
  Administered 2020-06-30: 1000 mL via INTRAVENOUS

## 2020-06-30 MED ORDER — SODIUM CHLORIDE 0.9 % IV SOLN: INTRAVENOUS | Status: DC

## 2020-06-30 MED ORDER — APIXABAN 5 MG PO TABS: 5.0000 mg | ORAL_TABLET | Freq: Two times a day (BID) | ORAL | 1 refills | Status: DC

## 2020-06-30 MED ORDER — PROPOFOL 500 MG/50ML IV EMUL
INTRAVENOUS | Status: DC | PRN
  Administered 2020-06-30: 150 ug/kg/min via INTRAVENOUS

## 2020-06-30 MED ORDER — PROPOFOL 10 MG/ML IV BOLUS
INTRAVENOUS | Status: DC | PRN
  Administered 2020-06-30: 50 mg via INTRAVENOUS

## 2020-06-30 SURGICAL SUPPLY — 14 items

## 2020-06-30 NOTE — Telephone Encounter (Signed)
Left message on machine to call back  

## 2020-06-30 NOTE — Transfer of Care (Signed)
Immediate Anesthesia Transfer of Care Note  Patient: Victor Huber  Procedure(s) Performed: ESOPHAGOGASTRODUODENOSCOPY (EGD) WITH PROPOFOL (N/A ) SAVORY DILATION (N/A )  Patient Location: PACU  Anesthesia Type:MAC  Level of Consciousness: drowsy and patient cooperative  Airway & Oxygen Therapy: Patient Spontanous Breathing and Patient connected to nasal cannula oxygen  Post-op Assessment: Report given to RN and Post -op Vital signs reviewed and stable  Post vital signs: Reviewed and stable  Last Vitals:  Vitals Value Taken Time  BP 108/78 06/30/20 0815  Temp 37.1 C 06/30/20 0815  Pulse 73 06/30/20 0833  Resp 16 06/30/20 0833  SpO2 96 % 06/30/20 0833  Vitals shown include unvalidated device data.  Last Pain:  Vitals:   06/30/20 0815  TempSrc:   PainSc: 0-No pain         Complications: No complications documented.

## 2020-06-30 NOTE — Discharge Instructions (Signed)
YOU HAD AN ENDOSCOPIC PROCEDURE TODAY: Refer to the procedure report and other information in the discharge instructions given to you for any specific questions about what was found during the examination. If this information does not answer your questions, please call [place] office at [phone] to clarify.  ° °YOU SHOULD EXPECT: Some feelings of bloating in the abdomen. Passage of more gas than usual. Walking can help get rid of the air that was put into your GI tract during the procedure and reduce the bloating. If you had a lower endoscopy (such as a colonoscopy or flexible sigmoidoscopy) you may notice spotting of blood in your stool or on the toilet paper. Some abdominal soreness may be present for a day or two, also. ° °DIET: Your first meal following the procedure should be a light meal and then it is ok to progress to your normal diet. A half-sandwich or bowl of soup is an example of a good first meal. Heavy or fried foods are harder to digest and may make you feel nauseous or bloated. Drink plenty of fluids but you should avoid alcoholic beverages for 24 hours. If you had a esophageal dilation, please see attached instructions for diet.   ° °ACTIVITY: Your care partner should take you home directly after the procedure. You should plan to take it easy, moving slowly for the rest of the day. You can resume normal activity the day after the procedure however YOU SHOULD NOT DRIVE, use power tools, machinery or perform tasks that involve climbing or major physical exertion for 24 hours (because of the sedation medicines used during the test).  ° °SYMPTOMS TO REPORT IMMEDIATELY: °A gastroenterologist can be reached at any hour. Please call [phone]  for any of the following symptoms:  °Following lower endoscopy (colonoscopy, flexible sigmoidoscopy) °Excessive amounts of blood in the stool  °Significant tenderness, worsening of abdominal pains  °Swelling of the abdomen that is new, acute  °Fever of 100° or  higher  °Following upper endoscopy (EGD, EUS, ERCP, esophageal dilation) °Vomiting of blood or coffee ground material  °New, significant abdominal pain  °New, significant chest pain or pain under the shoulder blades  °Painful or persistently difficult swallowing  °New shortness of breath  °Black, tarry-looking or red, bloody stools ° °FOLLOW UP:  °If any biopsies were taken you will be contacted by phone or by letter within the next 1-3 weeks. Call [phone]  if you have not heard about the biopsies in 3 weeks.  °Please also call with any specific questions about appointments or follow up tests. ° °

## 2020-06-30 NOTE — Op Note (Signed)
[facility] Patient Name: Victor Huber Procedure Date : 06/30/2020 MRN: [id number] Attending MD: Justice Britain , MD Date of Birth: Feb 26, 1951 CSN: [id number] Age: 70 Admit Type: Outpatient Procedure:                Upper GI endoscopy Indications:              Dysphagia, Stenosis of the esophagus, Follow-up of                            esophageal stenosis, For therapy of esophageal                            stenosis Providers:                Justice Britain, MD, Kary Kos RN, RN,                            Dulcy Fanny, Waynette Buttery., Technician Referring MD:             Fanny Bien. Ovidio Kin, MD, [place]                            [place] Medicines:                Monitored Anesthesia Care Complications:            No immediate complications. Estimated Blood Loss:     Estimated blood loss was minimal. Procedure:                Pre-Anesthesia Assessment:                           - Prior to the procedure, a History and Physical                            was performed, and patient medications and                            allergies were reviewed. The patient's tolerance of                            previous anesthesia was also reviewed. The risks                            and benefits of the procedure and the sedation                            options and risks were discussed with the patient.                            All questions were answered, and informed consent                            was obtained. Prior Anticoagulants: The patient has  taken Eliquis (apixaban), last dose was 2 days                            prior to procedure. ASA Grade Assessment: III - A                            patient with severe systemic disease. After                            reviewing the risks and benefits, the patient was                            deemed in satisfactory condition to undergo the                             procedure.                           After obtaining informed consent, the endoscope was                            passed under direct vision. Throughout the                            procedure, the patient's blood pressure, pulse, and                            oxygen saturations were monitored continuously. The                            GIF-H190 ([id number] Olympus gastroscope was                            introduced through the mouth, and advanced to the                            second part of duodenum. The upper GI endoscopy was                            accomplished without difficulty. The patient                            tolerated the procedure. Scope In: Scope Out: Findings:      LA Grade C (one or more mucosal breaks continuous between tops of 2 or       more mucosal folds, less than 75% circumference) esophagitis with no       bleeding was found 25 to 31 cm from the incisors. This led to esophageal       stricturing in region from the inflammation and scarring from healing.       After the rest of the EGD was complete, a guidewire was placed under       fluoroscopic guidance and the scope was withdrawn. Dilation was       performed with a Savary dilator with no resistance at 14 mm  and moderate       resistance at 16 mm. The dilation site was examined following endoscope       reinsertion and showed moderate mucosal disruption (mucosal wrents),       some oozing from site of dilation, moderate improvement in luminal       narrowing and no perforation.      No gross lesions were noted in the proximal esophagus and in the distal       esophagus.      A 3 cm hiatal hernia was present.      Striped mildly erythematous mucosa without bleeding was found in the       gastric antrum.      No other gross lesions were noted in the entire examined stomach.      No gross lesions were noted in the duodenal bulb, in the first portion       of the duodenum and in the  second portion of the duodenum. Impression:               - LA Grade C esophagitis with no bleeding in                            mid-esophagus - this is slowly improving over time                            but looks better than it did 4-weeks ago. This has                            led to esophageal stricturing in region. Dilated.                            We have biopsied previously and non-malignant.                           - No gross lesions in esophagus in proximal/distal                            esophagus.                           - 3 cm hiatal hernia.                           - Erythematous mucosa in the antrum. No other gross                            lesions in the stomach. Previously biopsied.                           - No gross lesions in the duodenal bulb, in the                            first portion of the duodenum and in the second                            portion of the duodenum. Recommendation:           -  The patient will be observed post-procedure,                            until all discharge criteria are met.                           - Discharge patient to home.                           - Dilation diet as per protocol. Clear liquid diet                            for next 4 hours and then full liquid rest of                            today. May advance slowly over the next few days to                            previous soft diet.                           - May restart Eliquis in 48 hours (4/2 AM) to                            decrease risk of post-interventional bleeding.                           - OK to take normal medications at this time.                           - Please use Cepacol or Halls Lozenges +/-                            Chloraseptic spray for next 72-96 hours to aid in                            sore thoat should you experience this.                           - Continue PPI twice daily.                           - Continue  Carafate with each meal and at bedtime.                           - Observe patient's clinical course.                           - Repeat upper endoscopy in 4 weeks for retreatment.                           - The findings and recommendations were discussed  with the patient.                           - The findings and recommendations were discussed                            with the patient's family. Procedure Code(s):        --- Professional ---                           [phone], Esophagogastroduodenoscopy, flexible,                            transoral; with insertion of guide wire followed by                            passage of dilator(s) through esophagus over guide                            wire Diagnosis Code(s):        --- Professional ---                           K20.90, Esophagitis, unspecified without bleeding                           K44.9, Diaphragmatic hernia without obstruction or                            gangrene                           K31.89, Other diseases of stomach and duodenum                           R13.10, Dysphagia, unspecified                           K22.2, Esophageal obstruction CPT copyright 2019 American Medical Association. All rights reserved. The codes documented in this report are preliminary and upon coder review may  be revised to meet current compliance requirements. Justice Britain, MD 06/30/2020 8:18:18 AM Number of Addenda: 0

## 2020-06-30 NOTE — Anesthesia Postprocedure Evaluation (Signed)
Anesthesia Post Note  Patient: Victor Huber  Procedure(s) Performed: ESOPHAGOGASTRODUODENOSCOPY (EGD) WITH PROPOFOL (N/A ) SAVORY DILATION (N/A )     Patient location during evaluation: PACU Anesthesia Type: General Level of consciousness: awake and alert and oriented Pain management: pain level controlled Vital Signs Assessment: post-procedure vital signs reviewed and stable Respiratory status: spontaneous breathing, nonlabored ventilation and respiratory function stable Cardiovascular status: blood pressure returned to baseline and stable Postop Assessment: no apparent nausea or vomiting Anesthetic complications: no   No complications documented.  Last Vitals:  Vitals:   06/30/20 0815 06/30/20 0836  BP: 108/78 113/73  Pulse: 84 66  Resp: 20 17  Temp: 37.1 C   SpO2: 97% 94%    Last Pain:  Vitals:   06/30/20 0815  TempSrc:   PainSc: 0-No pain                 Luna Audia,Shaan A.

## 2020-06-30 NOTE — H&P (Signed)
GASTROENTEROLOGY PROCEDURE H&P NOTE   Primary Care Physician: [name], MD  HPI: Victor Huber is a 70 y.o. male who presents for EGD with dilation of esophageal stricture s/p radiation.  Last EGD up to 14 mm.  Past Medical History:  Diagnosis Date  . Adenocarcinoma of left lung, stage 3 ([place]) 08/15/2016  . Allergy   . Anxiety   . Arthritis   . Atrial fibrillation ([place]) 08/15/2016  . Atrial fibrillation ([place])   . Cancer, metastatic to bone ([place])   . Cataract    Waiting to schedule bilateral cataract surgery  . COPD (chronic obstructive pulmonary disease) ([place]) 08/15/2016  . Depression   . Dysphagia   . Dysrhythmia    a-fib  . GERD (gastroesophageal reflux disease)   . Heart murmur    atenlol for A Fib/Eliquis  . History of chemotherapy   . History of radiation therapy   . Hyperlipidemia   . Hypothyroid 08/15/2016  . Longstanding persistent atrial fibrillation ([place]) 08/29/2016  . Pathologic fracture    left femur  . Pneumonitis   . S/P TURP 08/15/2016  . Wears glasses   . Wears hearing aid in both ears    Past Surgical History:  Procedure Laterality Date  . BIOPSY  12/21/2019   Procedure: BIOPSY;  Surgeon: Rush Landmark Telford Nab., MD;  Location: [facility];  Service: Gastroenterology;;  . BIOPSY  05/23/2020   Procedure: BIOPSY;  Surgeon: Irving Copas., MD;  Location: [facility];  Service: Gastroenterology;;  . BRONCHOSCOPY  10/2014  . CARDIAC CATHETERIZATION     05/07/12  . CARDIOVERSION     x2  . COLONOSCOPY     several yrs  . DG BIOPSY LUNG Left 10/2014   FNA - Adenocarcinoma   . ESOPHAGOGASTRODUODENOSCOPY (EGD) WITH PROPOFOL N/A 12/21/2019   Procedure: ESOPHAGOGASTRODUODENOSCOPY (EGD) WITH PROPOFOL;  Surgeon: Rush Landmark Telford Nab., MD;  Location: [facility];  Service: Gastroenterology;  Laterality: N/A;  . ESOPHAGOGASTRODUODENOSCOPY (EGD) WITH PROPOFOL N/A 05/23/2020   Procedure: ESOPHAGOGASTRODUODENOSCOPY (EGD) WITH PROPOFOL;  Surgeon:  Rush Landmark Telford Nab., MD;  Location: [facility];  Service: Gastroenterology;  Laterality: N/A;  . EUS N/A 12/21/2019   Procedure: UPPER ENDOSCOPIC ULTRASOUND (EUS) RADIAL;  Surgeon: Rush Landmark Telford Nab., MD;  Location: [facility];  Service: Gastroenterology;  Laterality: N/A;  . FEMUR IM NAIL Left 02/19/2017  . FEMUR IM NAIL Left 02/19/2017   Procedure: INTRAMEDULLARY (IM) NAIL FEMORAL;  Surgeon: Marchia Bond, MD;  Location: [facility];  Service: Orthopedics;  Laterality: Left;  . IR FLUORO GUIDE PORT INSERTION RIGHT  07/03/2017  . IR US GUIDE VASC ACCESS RIGHT  07/03/2017  . LUNG CANCER SURGERY Left 12/2014   Wedge Resection   . MULTIPLE TOOTH EXTRACTIONS    . SAVORY DILATION N/A 05/23/2020   Procedure: SAVORY DILATION;  Surgeon: Rush Landmark Telford Nab., MD;  Location: [facility];  Service: Gastroenterology;  Laterality: N/A;  . Status post TURP    . TONSILLECTOMY     Current Facility-Administered Medications  Medication Dose Route Frequency Provider Last Rate Last Admin  . 0.9 %  sodium chloride infusion   Intravenous Continuous [name], Telford Nab., MD      . lactated ringers infusion   Intravenous Continuous [name], Telford Nab., MD 10 mL/hr at 06/30/20 0641 1,000 mL at 06/30/20 0641   Allergies  Allergen Reactions  . Carboplatin Itching, Nausea And Vomiting and Other (See Comments)    Flushing  . Flecainide Hypertension    CAUSED HEART ISSUES   .  Debrox [Carbamide Peroxide]     Swelling in ear canal.    Family History  Problem Relation Age of Onset  . Breast cancer Mother   . Breast cancer Maternal Aunt   . Breast cancer Maternal Aunt   . Lung disease Neg Hx   . Colon cancer Neg Hx   . Stomach cancer Neg Hx   . Pancreatic cancer Neg Hx   . Rectal cancer Neg Hx   . Colon polyps Neg Hx   . Esophageal cancer Neg Hx    Social History   Socioeconomic History  . Marital status: Married    Spouse name: Not on file  . Number of children: Not on file  . Years of  education: Not on file  . Highest education level: Not on file  Occupational History  . Not on file  Tobacco Use  . Smoking status: Former Smoker    Packs/day: 1.00    Years: 50.00    Pack years: 50.00    Types: Cigarettes    Quit date: 08/15/2012    Years since quitting: 7.8  . Smokeless tobacco: Never Used  Vaping Use  . Vaping Use: Never used  Substance and Sexual Activity  . Alcohol use: No  . Drug use: No  . Sexual activity: Not on file  Other Topics Concern  . Not on file  Social History Narrative   [facility] (09/26/16):   Originally from [place]. Moved to [facility] February 2018. Always lived in [place]. Moved to be closer to children & grandchildren. No international travel. 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 No mold exposure. No bird exposure or hot tub exposure. Enjoys reading. Previously enjoyed wood working with domestic woods.    Social Determinants of Health   Financial Resource Strain: Low Risk   . Difficulty of Paying Living Expenses: Not hard at all  Food Insecurity: No Food Insecurity  . Worried About Charity fundraiser in the Last Year: Never true  . Ran Out of Food in the Last Year: Never true  Transportation Needs: No Transportation Needs  . Lack of Transportation (Medical): No  . Lack of Transportation (Non-Medical): No  Physical Activity: Not on file  Stress: Not on file  Social Connections: Not on file  Intimate Partner Violence: Not on file    Physical Exam: Vital signs in last 24 hours: Temp:  [97.2 F (36.2 C)] 97.2 F (36.2 C) (03/31 0637) Pulse Rate:  [82] 82 (03/31 0637) Resp:  [16] 16 (03/31 0637) BP: (126)/(71) 126/71 (03/31 0637) SpO2:  [98 %] 98 % (03/31 0637) Weight:  [88 kg] 88 kg (03/31 0637)   GEN: NAD EYE: Sclerae anicteric ENT: MMM CV: Non-tachycardic GI: Soft, NT/ND NEURO:  Alert & Oriented x 3  Lab Results: No results for input(s): WBC, HGB,  HCT, PLT in the last 72 hours. BMET No results for input(s): NA, K, CL, CO2, GLUCOSE, BUN, CREATININE, CALCIUM in the last 72 hours. LFT No results for input(s): PROT, ALBUMIN, AST, ALT, ALKPHOS, BILITOT, BILIDIR, IBILI in the last 72 hours. PT/INR No results for input(s): LABPROT, INR in the last 72 hours.   Impression / Plan: This is a 70 y.o.male who presents for EGD with dilation of esophageal stricture s/p radiation.  Last EGD up to 14 mm.  The risks and benefits of endoscopic evaluation were discussed with the patient; these include but are not limited to the risk of  perforation, infection, bleeding, missed lesions, lack of diagnosis, severe illness requiring hospitalization, as well as anesthesia and sedation related illnesses.  The patient is agreeable to proceed.    Justice Britain, MD [facility] Office # [phone]

## 2020-06-30 NOTE — Telephone Encounter (Signed)
-----   Message from Irving Copas., MD sent at 06/30/2020  8:37 AM EDT ----- Chong Sicilian, We will plan a repeat EGD in 4-5 weeks at the [facility] for further dilation. Please find a spot and I am OK to do a 730 or 100 or 430 PM slot as necessary. Thanks. GM

## 2020-07-01 ENCOUNTER — Encounter (HOSPITAL_COMMUNITY): Payer: Self-pay | Admitting: Gastroenterology

## 2020-07-01 ENCOUNTER — Other Ambulatory Visit: Payer: Self-pay | Admitting: Cardiovascular Disease

## 2020-07-01 NOTE — Telephone Encounter (Signed)
The pt has been scheduled for 5/20 EGD and previsit prior.  The pt's wife has been given the information.  She will call with any concerns

## 2020-07-01 NOTE — Telephone Encounter (Signed)
Prescription refill request for Eliquis received. Indication: atrial fib Last office visit:12/21 [place] Scr:0.73 1/22 Age: 70 Weight:88 kg  Prescription refilled

## 2020-07-04 ENCOUNTER — Other Ambulatory Visit: Payer: Self-pay | Admitting: Internal Medicine

## 2020-07-04 ENCOUNTER — Ambulatory Visit: Payer: Medicare HMO | Admitting: Family Medicine

## 2020-07-04 ENCOUNTER — Other Ambulatory Visit: Payer: Self-pay

## 2020-07-04 ENCOUNTER — Ambulatory Visit: Payer: Self-pay

## 2020-07-04 ENCOUNTER — Telehealth: Payer: Self-pay

## 2020-07-04 DIAGNOSIS — M25562 Pain in left knee: Secondary | ICD-10-CM | POA: Diagnosis not present

## 2020-07-04 DIAGNOSIS — M25561 Pain in right knee: Secondary | ICD-10-CM

## 2020-07-04 MED ORDER — FENTANYL 25 MCG/HR TD PT72
1.0000 | MEDICATED_PATCH | TRANSDERMAL | 0 refills | Status: DC
Start: 2020-07-04 — End: 2020-10-04

## 2020-07-04 NOTE — Patient Instructions (Addendum)
You had B knee Orthovisc injections today.  Call or go to the ER if you develop a large red swollen joint with extreme pain or oozing puss.   Please schedule an appointment for next week for round 3.

## 2020-07-04 NOTE — Progress Notes (Signed)
Victor Huber presents to clinic today for Orthovisc injection bilateral knees 2/3  Procedure: Real-time Ultrasound Guided Injection of right knee superior lateral patellar space Device: Philips Affiniti 50G Images permanently stored and available for review in PACS Verbal informed consent obtained.  Discussed risks and benefits of procedure. Warned about infection bleeding damage to structures skin hypopigmentation and fat atrophy among others. Patient expresses understanding and agreement Time-out conducted.   Noted no overlying erythema, induration, or other signs of local infection.   Skin prepped in a sterile fashion.   Local anesthesia: Topical Ethyl chloride.   With sterile technique and under real time ultrasound guidance:  Orthovisc 30 mg injected into right knee joint. Fluid seen entering the capsule.   Completed without difficulty    Advised to call if fevers/chills, erythema, induration, drainage, or persistent bleeding.   Images permanently stored and available for review in the ultrasound unit.  Impression: Technically successful ultrasound guided injection.   Procedure: Real-time Ultrasound Guided Injection of left knee superior lateral patellar space Device: Philips Affiniti 50G Images permanently stored and available for review in PACS Verbal informed consent obtained.  Discussed risks and benefits of procedure. Warned about infection bleeding damage to structures skin hypopigmentation and fat atrophy among others. Patient expresses understanding and agreement Time-out conducted.   Noted no overlying erythema, induration, or other signs of local infection.   Skin prepped in a sterile fashion.   Local anesthesia: Topical Ethyl chloride.   With sterile technique and under real time ultrasound guidance:  Orthovisc 30 mg injected into joint. Fluid seen entering the joint capsule.   Completed without difficulty    Advised to call if fevers/chills, erythema, induration, drainage, or  persistent bleeding.   Images permanently stored and available for review in the ultrasound unit.  Impression: Technically successful ultrasound guided injection.   Lot #[id number] left knee  Lot #[id number] right knee  Return in 1 week for Orthovisc injection #3

## 2020-07-04 NOTE — Telephone Encounter (Signed)
Pt wife requests refill of Fentanyl  Pts wife also states he is to have a scan before his appt. I have provided her with the contact number to Radiology scheduling. She expressed understanding of this information.

## 2020-07-05 NOTE — Telephone Encounter (Signed)
CT scan has been scheduled for 07/13/20.

## 2020-07-11 DIAGNOSIS — H2511 Age-related nuclear cataract, right eye: Secondary | ICD-10-CM | POA: Diagnosis not present

## 2020-07-12 ENCOUNTER — Other Ambulatory Visit: Payer: Self-pay

## 2020-07-12 ENCOUNTER — Ambulatory Visit: Payer: Self-pay

## 2020-07-12 ENCOUNTER — Ambulatory Visit (INDEPENDENT_AMBULATORY_CARE_PROVIDER_SITE_OTHER): Payer: Medicare HMO | Admitting: Family Medicine

## 2020-07-12 DIAGNOSIS — M25562 Pain in left knee: Secondary | ICD-10-CM | POA: Diagnosis not present

## 2020-07-12 DIAGNOSIS — H25012 Cortical age-related cataract, left eye: Secondary | ICD-10-CM | POA: Diagnosis not present

## 2020-07-12 DIAGNOSIS — M25561 Pain in right knee: Secondary | ICD-10-CM

## 2020-07-12 DIAGNOSIS — H25042 Posterior subcapsular polar age-related cataract, left eye: Secondary | ICD-10-CM | POA: Diagnosis not present

## 2020-07-12 DIAGNOSIS — H2512 Age-related nuclear cataract, left eye: Secondary | ICD-10-CM | POA: Diagnosis not present

## 2020-07-12 NOTE — Progress Notes (Signed)
Victor Huber presents to clinic today for Orthovisc injection bilateral knees 3/3  Procedure: Real-time Ultrasound Guided Injection of right knee superior lateral patellar space Device: Philips Affiniti 50G Images permanently stored and available for review in PACS Verbal informed consent obtained.  Discussed risks and benefits of procedure. Warned about infection bleeding damage to structures skin hypopigmentation and fat atrophy among others. Patient expresses understanding and agreement Time-out conducted.   Noted no overlying erythema, induration, or other signs of local infection.   Skin prepped in a sterile fashion.   Local anesthesia: Topical Ethyl chloride.   With sterile technique and under real time ultrasound guidance:  Orthovisc 30 mg injected into right knee joint. Fluid seen entering the joint capsule.   Completed without difficulty   Advised to call if fevers/chills, erythema, induration, drainage, or persistent bleeding.   Images permanently stored and available for review in the ultrasound unit.  Impression: Technically successful ultrasound guided injection.   Procedure: Real-time Ultrasound Guided Injection of left knee superior lateral patellar space Device: Philips Affiniti 50G Images permanently stored and available for review in PACS Verbal informed consent obtained.  Discussed risks and benefits of procedure. Warned about infection bleeding damage to structures skin hypopigmentation and fat atrophy among others. Patient expresses understanding and agreement Time-out conducted.   Noted no overlying erythema, induration, or other signs of local infection.   Skin prepped in a sterile fashion.   Local anesthesia: Topical Ethyl chloride.   With sterile technique and under real time ultrasound guidance:  Orthovisc 30 mg injected into left knee joint. Fluid seen entering the joint capsule.   Completed without difficulty   Advised to call if fevers/chills, erythema, induration,  drainage, or persistent bleeding.   Images permanently stored and available for review in the ultrasound unit.  Impression: Technically successful ultrasound guided injection.  Lot #[id number] for both injections  Recheck as needed for knee pain.  Patient has scheduled ankle MRI April 25.  He will schedule follow-up appointment after the ankle MRI.

## 2020-07-12 NOTE — Patient Instructions (Signed)
Thank you for coming in today.   Call or go to the ER if you develop a large red swollen joint with extreme pain or oozing puss.    Recheck as needed.    

## 2020-07-13 ENCOUNTER — Ambulatory Visit (HOSPITAL_COMMUNITY)
Admission: RE | Admit: 2020-07-13 | Discharge: 2020-07-13 | Disposition: A | Discharge status: Home / Self Care | Payer: Medicare HMO | Source: Ambulatory Visit | Attending: Internal Medicine | Admitting: Internal Medicine

## 2020-07-13 ENCOUNTER — Inpatient Hospital Stay: Payer: Medicare HMO | Attending: Internal Medicine

## 2020-07-13 ENCOUNTER — Other Ambulatory Visit: Payer: Self-pay

## 2020-07-13 DIAGNOSIS — Z85118 Personal history of other malignant neoplasm of bronchus and lung: Secondary | ICD-10-CM | POA: Diagnosis not present

## 2020-07-13 DIAGNOSIS — Z923 Personal history of irradiation: Secondary | ICD-10-CM | POA: Diagnosis not present

## 2020-07-13 DIAGNOSIS — Z791 Long term (current) use of non-steroidal anti-inflammatories (NSAID): Secondary | ICD-10-CM | POA: Insufficient documentation

## 2020-07-13 DIAGNOSIS — K769 Liver disease, unspecified: Secondary | ICD-10-CM | POA: Diagnosis not present

## 2020-07-13 DIAGNOSIS — K8020 Calculus of gallbladder without cholecystitis without obstruction: Secondary | ICD-10-CM | POA: Diagnosis not present

## 2020-07-13 DIAGNOSIS — Z9221 Personal history of antineoplastic chemotherapy: Secondary | ICD-10-CM | POA: Diagnosis not present

## 2020-07-13 DIAGNOSIS — J432 Centrilobular emphysema: Secondary | ICD-10-CM | POA: Diagnosis not present

## 2020-07-13 DIAGNOSIS — I2510 Atherosclerotic heart disease of native coronary artery without angina pectoris: Secondary | ICD-10-CM | POA: Diagnosis not present

## 2020-07-13 DIAGNOSIS — Z79899 Other long term (current) drug therapy: Secondary | ICD-10-CM | POA: Insufficient documentation

## 2020-07-13 DIAGNOSIS — C3490 Malignant neoplasm of unspecified part of unspecified bronchus or lung: Secondary | ICD-10-CM | POA: Insufficient documentation

## 2020-07-13 DIAGNOSIS — Z902 Acquired absence of lung [part of]: Secondary | ICD-10-CM | POA: Diagnosis not present

## 2020-07-13 DIAGNOSIS — C3432 Malignant neoplasm of lower lobe, left bronchus or lung: Secondary | ICD-10-CM | POA: Insufficient documentation

## 2020-07-13 DIAGNOSIS — J90 Pleural effusion, not elsewhere classified: Secondary | ICD-10-CM | POA: Diagnosis not present

## 2020-07-13 DIAGNOSIS — C7951 Secondary malignant neoplasm of bone: Secondary | ICD-10-CM | POA: Insufficient documentation

## 2020-07-13 DIAGNOSIS — K8689 Other specified diseases of pancreas: Secondary | ICD-10-CM | POA: Diagnosis not present

## 2020-07-13 DIAGNOSIS — E039 Hypothyroidism, unspecified: Secondary | ICD-10-CM | POA: Insufficient documentation

## 2020-07-13 LAB — CMP (CANCER CENTER ONLY)
ALT: 14 U/L (ref 0–44)
AST: 11 U/L — ABNORMAL LOW (ref 15–41)
Albumin: 3.6 g/dL (ref 3.5–5.0)
Alkaline Phosphatase: 86 U/L (ref 38–126)
Anion gap: 11 (ref 5–15)
BUN: 13 mg/dL (ref 8–23)
CO2: 28 mmol/L (ref 22–32)
Calcium: 9.4 mg/dL (ref 8.9–10.3)
Chloride: 103 mmol/L (ref 98–111)
Creatinine: 0.71 mg/dL (ref 0.61–1.24)
GFR, Estimated: >60 mL/min (ref >60)
Glucose, Bld: 135 mg/dL — ABNORMAL HIGH (ref 70–99)
Potassium: 4.8 mmol/L (ref 3.5–5.1)
Sodium: 142 mmol/L (ref 135–145)
Total Bilirubin: 1 mg/dL (ref 0.3–1.2)
Total Protein: 6.9 g/dL (ref 6.5–8.1)

## 2020-07-13 LAB — CBC WITH DIFFERENTIAL (CANCER CENTER ONLY)
Abs Immature Granulocytes: 0.06 10*3/uL (ref 0.00–0.07)
Basophils Absolute: 0 10*3/uL (ref 0.0–0.1)
Basophils Relative: 0 %
Eosinophils Absolute: 0.1 10*3/uL (ref 0.0–0.5)
Eosinophils Relative: 1 %
HCT: 44.2 % (ref 39.0–52.0)
Hemoglobin: 14.4 g/dL (ref 13.0–17.0)
Immature Granulocytes: 0 %
Lymphocytes Relative: 17 %
Lymphs Abs: 2.4 10*3/uL (ref 0.7–4.0)
MCH: 30.7 pg (ref 26.0–34.0)
MCHC: 32.6 g/dL (ref 30.0–36.0)
MCV: 94.2 fL (ref 80.0–100.0)
Monocytes Absolute: 1 10*3/uL (ref 0.1–1.0)
Monocytes Relative: 7 %
Neutro Abs: 10.4 10*3/uL — ABNORMAL HIGH (ref 1.7–7.7)
Neutrophils Relative %: 75 %
Platelet Count: 196 10*3/uL (ref 150–400)
RBC: 4.69 MIL/uL (ref 4.22–5.81)
RDW: 15.1 % (ref 11.5–15.5)
WBC Count: 14 10*3/uL — ABNORMAL HIGH (ref 4.0–10.5)
nRBC: 0 % (ref 0.0–0.2)

## 2020-07-13 LAB — TSH: TSH: 3.966 u[IU]/mL (ref 0.320–4.118)

## 2020-07-13 MED ORDER — IOHEXOL 9 MG/ML PO SOLN
ORAL | Status: AC
  Filled 2020-07-13: qty 1000

## 2020-07-13 MED ORDER — IOHEXOL 300 MG/ML  SOLN
100.0000 mL | Freq: Once | INTRAMUSCULAR | Status: AC | PRN
  Administered 2020-07-13: 100 mL via INTRAVENOUS

## 2020-07-13 MED ORDER — IOHEXOL 9 MG/ML PO SOLN
1000.0000 mL | ORAL | Status: AC
  Administered 2020-07-13: 1000 mL via ORAL

## 2020-07-14 ENCOUNTER — Inpatient Hospital Stay (HOSPITAL_BASED_OUTPATIENT_CLINIC_OR_DEPARTMENT_OTHER): Payer: Medicare HMO | Admitting: Internal Medicine

## 2020-07-14 ENCOUNTER — Encounter: Payer: Self-pay | Admitting: Internal Medicine

## 2020-07-14 VITALS — BP 112/72 | HR 103 | Temp 98.5°F | Resp 20 | Ht 73.0 in | Wt 191.0 lb

## 2020-07-14 DIAGNOSIS — C7951 Secondary malignant neoplasm of bone: Secondary | ICD-10-CM

## 2020-07-14 DIAGNOSIS — Z79899 Other long term (current) drug therapy: Secondary | ICD-10-CM

## 2020-07-14 DIAGNOSIS — Z9221 Personal history of antineoplastic chemotherapy: Secondary | ICD-10-CM | POA: Diagnosis not present

## 2020-07-14 DIAGNOSIS — C770 Secondary and unspecified malignant neoplasm of lymph nodes of head, face and neck: Secondary | ICD-10-CM | POA: Diagnosis not present

## 2020-07-14 DIAGNOSIS — Z5112 Encounter for antineoplastic immunotherapy: Secondary | ICD-10-CM

## 2020-07-14 DIAGNOSIS — E039 Hypothyroidism, unspecified: Secondary | ICD-10-CM

## 2020-07-14 DIAGNOSIS — C3490 Malignant neoplasm of unspecified part of unspecified bronchus or lung: Secondary | ICD-10-CM

## 2020-07-14 DIAGNOSIS — C3432 Malignant neoplasm of lower lobe, left bronchus or lung: Secondary | ICD-10-CM | POA: Diagnosis not present

## 2020-07-14 DIAGNOSIS — Z902 Acquired absence of lung [part of]: Secondary | ICD-10-CM | POA: Diagnosis not present

## 2020-07-14 DIAGNOSIS — Z923 Personal history of irradiation: Secondary | ICD-10-CM

## 2020-07-14 DIAGNOSIS — C3492 Malignant neoplasm of unspecified part of left bronchus or lung: Secondary | ICD-10-CM | POA: Diagnosis not present

## 2020-07-14 DIAGNOSIS — J189 Pneumonia, unspecified organism: Secondary | ICD-10-CM

## 2020-07-14 DIAGNOSIS — Z791 Long term (current) use of non-steroidal anti-inflammatories (NSAID): Secondary | ICD-10-CM | POA: Diagnosis not present

## 2020-07-14 MED ORDER — METHYLPREDNISOLONE 4 MG PO TBPK: ORAL_TABLET | ORAL | 0 refills | Status: DC

## 2020-07-14 MED ORDER — DOXYCYCLINE HYCLATE 100 MG PO TABS: 100.0000 mg | ORAL_TABLET | Freq: Two times a day (BID) | ORAL | 0 refills | Status: DC

## 2020-07-14 NOTE — Progress Notes (Signed)
[facility] [phone]   [phone]  OFFICE PROGRESS NOTE  [name], MD [address] [place] [address]  DIAGNOSIS: Metastatic non-small cell lung cancer initially diagnosed as stage IIIA (T2a, N2, M0) non-small cell lung cancer, poorly differentiated adenocarcinoma presented with left lower lobe lung mass in addition to mediastinal lymphadenopathy.  The patient was diagnosed with metastatic disease involving the left femur as well as left supraclavicular nodal metastases and right paratracheal lymphadenopathy in October 2018.  Biomarker Findings Microsatellite Status - MS-Stable Tumor Mutational Burden - TMB-Low (3 Muts/Mb) Genomic Findings For a complete list of the genes assayed, please refer to the Appendix. STK11 P263fs*6 NVBT6O M60OKH splice site 997+7S>F DAXX E374* MLL2 V4551fs*40 NBN K246fs*5 NOTCH2 S2395V PMS2 splice site 202+3X>I 8 Disease relevant genes with no reportable alterations: EGFR, KRAS, ALK, BRAF, MET, RET, ERBB2, ROS1   PDL1 expression 5%  PRIOR THERAPY: 1) status post wedge resection of the left lower lobe lung mass as well as AP window lymph node dissection but there was residual metastatic mediastinal lymphadenopathy that could not be resected. 2) a course of concurrent chemoradiation with weekly carboplatin and paclitaxel in [place] completed 03/01/2015.  3) status post palliative radiotherapy to the left femur metastatic bone disease. 4)  Systemic chemotherapy with carboplatin for AUC of 5, Alimta 500 mg/M2 and Keytruda 200 mg IV every 3 weeks.  First dose March 07, 2017.  Carboplatin was discontinued during cycle #2 secondary to hypersensitivity reaction. 5) status post 2 cycles of maintenance treatment with Alimta and Ketruda (pembrolizumab).  Alimta was discontinued secondary to intolerance.  CURRENT THERAPY: Maintenance treatment with single agent Ketruda (pembrolizumab) status post 49  cycles.   His treatment has been on hold since January 2022 secondary to intolerance.  INTERVAL HISTORY: Victor Huber 70 y.o. male returns to the clinic today for follow-up visit accompanied by his wife.  The patient is feeling fine today with no concerning complaints except for cough and shortness of breath with exertion.  He also lost a lot of weight over the last few months.  He was seen by gastroenterology and had upper endoscopy by Dr. Rush Landmark.  He has LA grade C esophagitis with no bleeding and middle esophagus.  The esophageal stricture was dilated.  He also had a 3 cm hiatal hernia and erythematous mucosa in the antrum.  He has been on observation for the last few months.  He denied having any current chest pain or hemoptysis.  He has no nausea, vomiting, diarrhea or constipation.  He reported intermittent fever at home and the highest 1 was one 1.9 few days ago with shaking chills.  The patient had repeat CT scan of the chest performed yesterday and he is here for evaluation and discussion of his discuss results.  MEDICAL HISTORY: Past Medical History:  Diagnosis Date  . Adenocarcinoma of left lung, stage 3 ([place]) 08/15/2016  . Allergy   . Anxiety   . Arthritis   . Atrial fibrillation ([facility]) 08/15/2016  . Atrial fibrillation ([place])   . Cancer, metastatic to bone ([facility])   . Cataract    Waiting to schedule bilateral cataract surgery  . COPD (chronic obstructive pulmonary disease) ([facility]) 08/15/2016  . Depression   . Dysphagia   . Dysrhythmia    a-fib  . GERD (gastroesophageal reflux disease)   . Heart murmur    atenlol for A Fib/Eliquis  . History of chemotherapy   . History of radiation therapy   .  Hyperlipidemia   . Hypothyroid 08/15/2016  . Longstanding persistent atrial fibrillation ([facility] [facility]) 08/29/2016  . Pathologic fracture    left femur  . Pneumonitis   . S/P TURP 08/15/2016  . Wears glasses   . Wears hearing aid in both ears     ALLERGIES:  is allergic to carboplatin, flecainide,  and debrox [carbamide peroxide].  MEDICATIONS:  Current Outpatient Medications  Medication Sig Dispense Refill  . acetaminophen (TYLENOL) 500 MG tablet Take 500 mg by mouth every 8 (eight) hours as needed for moderate pain.    Marland Kitchen albuterol (VENTOLIN HFA) 108 (90 Base) MCG/ACT inhaler Inhale 1-2 puffs into the lungs every 4 (four) hours as needed for wheezing or shortness of breath. 3 month supply 24 g 3  . atenolol (TENORMIN) 50 MG tablet TAKE 1 TABLET EVERY DAY (Patient taking differently: Take 50 mg by mouth daily.) 90 tablet 3  . atorvastatin (LIPITOR) 40 MG tablet TAKE 1 TABLET EVERY DAY (Patient taking differently: Take 40 mg by mouth daily.) 90 tablet 2  . DUREZOL 0.05 % EMUL Place 1 drop into the left eye 3 (three) times daily.    Marland Kitchen ELIQUIS 5 MG TABS tablet TAKE 1 TABLET TWICE DAILY 180 tablet 1  . esomeprazole (NEXIUM) 40 MG capsule Take 1 capsule (40 mg total) by mouth 2 (two) times daily before a meal. 180 capsule 3  . fentaNYL (DURAGESIC) 25 MCG/HR Place 1 patch onto the skin every 3 (three) days. 10 patch 0  . gabapentin (NEURONTIN) 300 MG capsule Take 2 capsules (600 mg total) by mouth at bedtime as needed. 180 capsule 3  . gatifloxacin (ZYMAXID) 0.5 % SOLN Place 1 drop into the left eye 4 (four) times daily.    Marland Kitchen ibuprofen (ADVIL,MOTRIN) 400 MG tablet Take 400 mg by mouth every 4 (four) hours as needed for moderate pain.    Marland Kitchen levothyroxine (SYNTHROID) 200 MCG tablet Take 1 tablet (200 mcg total) by mouth daily before breakfast. 90 tablet 0  . lipase/protease/amylase (CREON) 36000 UNITS CPEP capsule Take 4 capsules po during each meal. Take 1-2 capsules po during each snack.( up to 2 snacks daily). (Patient taking differently: Take 36,000-72,000 Units by mouth See admin instructions. Take 2 capsules po during each meal. Take 1 capsule po during each snack.( up to 2 snacks daily).) 480 capsule 3  . Oxycodone HCl 10 MG TABS Take 1 tablet (10 mg total) by mouth daily as needed (pain).  (Patient taking differently: Take 5 mg by mouth daily as needed (pain).) 30 tablet 0  . PROLENSA 0.07 % SOLN Place 1 drop into the left eye at bedtime.    . sodium chloride (OCEAN) 0.65 % SOLN nasal spray Place 1 spray into both nostrils as needed for congestion.    . sucralfate (CARAFATE) 1 g tablet Take 1 tablet (1 g total) by mouth 4 (four) times daily -  with meals and at bedtime. 90 tablet 2  . tamsulosin (FLOMAX) 0.4 MG CAPS capsule Take 1 capsule (0.4 mg total) by mouth daily. 90 capsule 4  . venlafaxine XR (EFFEXOR-XR) 37.5 MG 24 hr capsule Take 37.5 mg by mouth 2 (two) times daily.     No current facility-administered medications for this visit.    SURGICAL HISTORY:  Past Surgical History:  Procedure Laterality Date  . BIOPSY  12/21/2019   Procedure: BIOPSY;  Surgeon: Rush Landmark Telford Nab., MD;  Location: [facility];  Service: Gastroenterology;;  . BIOPSY  05/23/2020   Procedure: BIOPSY;  Surgeon:  [name], Telford Nab., MD;  Location: [facility];  Service: Gastroenterology;;  . BRONCHOSCOPY  10/2014  . CARDIAC CATHETERIZATION     05/07/12  . CARDIOVERSION     x2  . COLONOSCOPY     several yrs  . DG BIOPSY LUNG Left 10/2014   FNA - Adenocarcinoma   . ESOPHAGOGASTRODUODENOSCOPY (EGD) WITH PROPOFOL N/A 12/21/2019   Procedure: ESOPHAGOGASTRODUODENOSCOPY (EGD) WITH PROPOFOL;  Surgeon: Rush Landmark Telford Nab., MD;  Location: [facility];  Service: Gastroenterology;  Laterality: N/A;  . ESOPHAGOGASTRODUODENOSCOPY (EGD) WITH PROPOFOL N/A 05/23/2020   Procedure: ESOPHAGOGASTRODUODENOSCOPY (EGD) WITH PROPOFOL;  Surgeon: Rush Landmark Telford Nab., MD;  Location: [facility];  Service: Gastroenterology;  Laterality: N/A;  . ESOPHAGOGASTRODUODENOSCOPY (EGD) WITH PROPOFOL N/A 06/30/2020   Procedure: ESOPHAGOGASTRODUODENOSCOPY (EGD) WITH PROPOFOL;  Surgeon: Rush Landmark Telford Nab., MD;  Location: [facility];  Service: Gastroenterology;  Laterality: N/A;  ultra slim scope avail  . EUS N/A  12/21/2019   Procedure: UPPER ENDOSCOPIC ULTRASOUND (EUS) RADIAL;  Surgeon: Irving Copas., MD;  Location: [facility];  Service: Gastroenterology;  Laterality: N/A;  . FEMUR IM NAIL Left 02/19/2017  . FEMUR IM NAIL Left 02/19/2017   Procedure: INTRAMEDULLARY (IM) NAIL FEMORAL;  Surgeon: Marchia Bond, MD;  Location: [facility];  Service: Orthopedics;  Laterality: Left;  . IR FLUORO GUIDE PORT INSERTION RIGHT  07/03/2017  . IR US GUIDE VASC ACCESS RIGHT  07/03/2017  . LUNG CANCER SURGERY Left 12/2014   Wedge Resection   . MULTIPLE TOOTH EXTRACTIONS    . SAVORY DILATION N/A 05/23/2020   Procedure: SAVORY DILATION;  Surgeon: Rush Landmark Telford Nab., MD;  Location: [facility];  Service: Gastroenterology;  Laterality: N/A;  . SAVORY DILATION N/A 06/30/2020   Procedure: SAVORY DILATION;  Surgeon: Irving Copas., MD;  Location: [facility];  Service: Gastroenterology;  Laterality: N/A;  . Status post TURP    . TONSILLECTOMY      REVIEW OF SYSTEMS:  Constitutional: positive for anorexia, fatigue and weight loss Eyes: negative Ears, nose, mouth, throat, and face: negative Respiratory: positive for cough and dyspnea on exertion Cardiovascular: negative Gastrointestinal: negative Genitourinary:negative Integument/breast: negative Hematologic/lymphatic: negative Musculoskeletal:positive for arthralgias and bone pain Neurological: negative Behavioral/Psych: negative Endocrine: negative Allergic/Immunologic: negative   PHYSICAL EXAMINATION: General appearance: alert, cooperative, fatigued and no distress Head: Normocephalic, without obvious abnormality, atraumatic Neck: no adenopathy, no JVD, supple, symmetrical, trachea midline and thyroid not enlarged, symmetric, no tenderness/mass/nodules Lymph nodes: Cervical, supraclavicular, and axillary nodes normal. Resp: clear to auscultation bilaterally Back: symmetric, no curvature. ROM normal. No CVA tenderness. Cardio: regular rate  and rhythm, S1, S2 normal, no murmur, click, rub or gallop GI: soft, non-tender; bowel sounds normal; no masses,  no organomegaly Extremities: extremities normal, atraumatic, no cyanosis or edema Neurologic: Alert and oriented X 3, normal strength and tone. Normal symmetric reflexes. Normal coordination and gait  ECOG PERFORMANCE STATUS: 1 - Symptomatic but completely ambulatory  Blood pressure 112/72, pulse (!) 103, temperature 98.5 F (36.9 C), temperature source Tympanic, resp. rate 20, height $RemoveBe'6\' 1"'[id number]$  (1.854 m), weight 191 lb (86.6 kg), SpO2 96 %.  LABORATORY DATA: Lab Results  Component Value Date   WBC 14.0 (H) 07/13/2020   HGB 14.4 07/13/2020   HCT 44.2 07/13/2020   MCV 94.2 07/13/2020   PLT 196 07/13/2020      Chemistry      Component Value Date/Time   NA 142 07/13/2020 0921   NA 136 04/04/2017 1141   K 4.8 07/13/2020 0921   K 4.4 04/04/2017 1141  CL 103 07/13/2020 0921   CO2 28 07/13/2020 0921   CO2 24 04/04/2017 1141   BUN 13 07/13/2020 0921   BUN 21.1 04/04/2017 1141   CREATININE 0.71 07/13/2020 0921   CREATININE 1.1 04/04/2017 1141      Component Value Date/Time   CALCIUM 9.4 07/13/2020 0921   CALCIUM 9.1 04/04/2017 1141   ALKPHOS 86 07/13/2020 0921   ALKPHOS 89 04/04/2017 1141   AST 11 (L) 07/13/2020 0921   AST 17 04/04/2017 1141   ALT 14 07/13/2020 0921   ALT 18 04/04/2017 1141   BILITOT 1.0 07/13/2020 0921   BILITOT 0.67 04/04/2017 1141       RADIOGRAPHIC STUDIES: DG ESOPHAGUS DILATION  Result Date: 06/30/2020 ESOPHAGEAL DILATATION: Fluoroscopy was provided for use by the requesting physician.  No images were obtained for radiographic interpretation.   ASSESSMENT AND PLAN: This is a 70 years old white male with metastatic non-small cell lung cancer, adenocarcinoma with no actionable mutations and PDL 1 expression of 5% that was initially diagnosed as stage IIIa non-small cell lung cancer, adenocarcinoma status post left lower lobectomy with lymph  node dissection followed by a course of concurrent chemoradiation completed in January 2016.  The patient had evidence for disease metastasis in October 2018 with metastatic disease to the left femur as well as left supraclavicular and right paratracheal lymph nodes. The patient is currently on systemic chemotherapy initially was with carboplatin, Alimta and Keytruda.  Carboplatin was discontinued secondary to hypersensitivity reaction starting from cycle #2.   He was also treated with 3 cycles of maintenance Alimta and Ketruda (pembrolizumab) but Alimta was discontinued secondary to intolerance. He is currently undergoing treatment with maintenance Keytruda as a single agent status post 49 cycles.  The patient has been tolerating his treatment fairly well with no concerning complaints but the patient continues to have a lot of issues with his arthritis especially in the knees and ankles.  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 His last scan showed no concerning findings for disease progression but there was new 8 mm nodule in the left lung that need close monitoring on the upcoming imaging studies.  The patient has been on observation since January 2022 and he is feeling fine except for the weight loss and intermittent fever. He had repeat CT scan of the chest, abdomen pelvis performed recently.  I personally and independently reviewed the scan images and discussed the result and showed the images to the patient and his wife today.  The final report was pending at the time of the visit but I did not see any concerning findings for disease progression.  He has more inflammatory process at the lung bases more on the right side. I recommended for the patient to continue on observation with repeat CT scan of the chest, abdomen and pelvis in 3 months. For the inflammatory process I will start the patient on doxycycline 100 mg p.o. twice daily for 10 days and I will give him also  prescription for Medrol Dosepak. For the arthralgia and bone disease, he will continue with his orthopedic surgeon as needed. For the hypothyroidism he will continue his current treatment with levothyroxine and routine follow-up visit by his primary care physician. The patient was advised to call immediately if he has any other concerning symptoms in the interval. The patient voices understanding of current disease status and treatment options and is in agreement with the current care plan. All questions were answered. The patient knows  to call the clinic with any problems, questions or concerns. We can certainly see the patient much sooner if necessary.  Disclaimer: This note was dictated with voice recognition software. Similar sounding words can inadvertently be transcribed and may not be corrected upon review.

## 2020-07-19 ENCOUNTER — Other Ambulatory Visit: Payer: Self-pay | Admitting: Gastroenterology

## 2020-07-25 ENCOUNTER — Other Ambulatory Visit: Payer: Self-pay

## 2020-07-25 ENCOUNTER — Ambulatory Visit (INDEPENDENT_AMBULATORY_CARE_PROVIDER_SITE_OTHER): Payer: Medicare HMO

## 2020-07-25 DIAGNOSIS — G8929 Other chronic pain: Secondary | ICD-10-CM

## 2020-07-25 DIAGNOSIS — M722 Plantar fascial fibromatosis: Secondary | ICD-10-CM | POA: Diagnosis not present

## 2020-07-25 DIAGNOSIS — M1712 Unilateral primary osteoarthritis, left knee: Secondary | ICD-10-CM | POA: Diagnosis not present

## 2020-07-25 DIAGNOSIS — S93421A Sprain of deltoid ligament of right ankle, initial encounter: Secondary | ICD-10-CM | POA: Diagnosis not present

## 2020-07-25 DIAGNOSIS — M25572 Pain in left ankle and joints of left foot: Secondary | ICD-10-CM

## 2020-07-25 DIAGNOSIS — M25571 Pain in right ankle and joints of right foot: Secondary | ICD-10-CM

## 2020-07-25 DIAGNOSIS — M25561 Pain in right knee: Secondary | ICD-10-CM | POA: Diagnosis not present

## 2020-07-25 DIAGNOSIS — S86311A Strain of muscle(s) and tendon(s) of peroneal muscle group at lower leg level, right leg, initial encounter: Secondary | ICD-10-CM | POA: Diagnosis not present

## 2020-07-25 DIAGNOSIS — M25562 Pain in left knee: Secondary | ICD-10-CM | POA: Diagnosis not present

## 2020-07-25 DIAGNOSIS — M19071 Primary osteoarthritis, right ankle and foot: Secondary | ICD-10-CM | POA: Diagnosis not present

## 2020-07-25 DIAGNOSIS — M19072 Primary osteoarthritis, left ankle and foot: Secondary | ICD-10-CM | POA: Diagnosis not present

## 2020-07-25 DIAGNOSIS — S86312A Strain of muscle(s) and tendon(s) of peroneal muscle group at lower leg level, left leg, initial encounter: Secondary | ICD-10-CM | POA: Diagnosis not present

## 2020-07-25 DIAGNOSIS — M67874 Other specified disorders of tendon, left ankle and foot: Secondary | ICD-10-CM | POA: Diagnosis not present

## 2020-07-27 NOTE — Progress Notes (Signed)
We will follow-up the results of this in further detail on the 28th. However MRI right ankle shows some arthritis and some intra-articular loose bodies.  It also shows tendinitis and some small tears of the peroneal tendons on the outside part of the ankle and some tearing of the deltoid ligament on the inside part of the ankle and some bone marrow edema at the outside part of the ankle and at the back of the ankle.

## 2020-07-27 NOTE — Progress Notes (Signed)
We will follow-up this result with more detail on your follow-up appointment on the 28th. MRI left ankle shows medium arthritis and some loose bodies inside the ankle joint and changes that could cause impingement. 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

## 2020-07-27 NOTE — Progress Notes (Signed)
I, Victor Huber, LAT, ATC, am serving as scribe for Dr. Lynne Huber.  Victor Huber is a 70 y.o. male who presents to [place] [facility] today for f/u bilat ankle pain and B ankle MRI review. He had a prior course of PT for his ankles which proved unhelpful.  Pt was last seen by Dr. Georgina Huber on 07/12/20 for 3/3 bilat Orthovisc injections. Today, pt reports that his ankles are feeling about the same as before.  He states that his L knee is better following his Orthovisc injections but his R knee is swollen and painful.  He notes that when he was taking prednisone he felt a lot better.  He wonders if he has a cortisol deficiency as he always feels better when he is taking prednisone.  Dx imaging: 07/25/20 R & L ankle MRI  05/02/20 R & L knee XR  04/15/20 R & L ankle XR  Pertinent review of systems: No fevers or chills  Relevant historical information: Lung cancer   Exam:  BP 102/68 (BP Location: Left Arm, Patient Position: Sitting, Cuff Size: Normal)   Pulse (!) 101   Ht 6\' 1"  (1.854 m)   Wt 195 lb 12.8 oz (88.8 kg)   SpO2 96%   BMI 25.83 kg/m  General: Well Developed, well nourished, and in no acute distress.   MSK: Right ankle mild effusion normal motion. Nontender along peroneal or posterior tibialis tendons Left ankle mild effusion normal motion Nontender along the peroneal or posterior tibialis tendons    Lab and Radiology Results No results found for this or any previous visit (from the past 52 hour(s)). MR ANKLE RIGHT WO CONTRAST  Result Date: 07/26/2020 CLINICAL DATA:  Right ankle pain and swelling for 6 months. No known injury. EXAM: MRI OF THE RIGHT ANKLE WITHOUT CONTRAST TECHNIQUE: Multiplanar, multisequence MR imaging of the ankle was performed. No intravenous contrast was administered. COMPARISON:  X-ray 04/15/2020 FINDINGS: TENDONS Peroneal: Tendinosis with long segment split tear of the retro malleolar and infra malleolar aspects of the  peroneus brevis tendon (series 13, images 23-31). Tendinosis and longitudinal split tear of the plantar aspect of the distal peroneus longus tendon (series 13, image 34). Posteromedial: Intact tibialis posterior, flexor hallucis longus and flexor digitorum longus tendons. Anterior: Intact tibialis anterior, extensor hallucis longus and extensor digitorum longus tendons. Achilles: Intact. Plantar Fascia: Mild thickening of the central band of the plantar fascia proximally without tear or perifascial edema. LIGAMENTS Lateral: The anterior and posterior tibiofibular ligaments are intact. The anterior and posterior talofibular ligaments are intact. Intact calcaneofibular ligament. Medial: Partial thickness interstitial tearing of the deltoid ligament without disruption. Spring ligament complex intact. CARTILAGE Ankle Joint: Mild diffuse chondral thinning of the tibiotalar joint. Tiny anterolateral marginal osteophytes. Small joint effusion with a few small intra-articular loose bodies at the posterior joint line. Subtalar Joints/Sinus Tarsi: Mild subtalar arthropathy. Soft tissue edema within the sinus tarsi. Bones: Tiny os trigonum with soft tissue edema at the posterior ankle. Subchondral cyst or intraosseous ganglion within the lateral malleolus. Patchy marrow edema within the medial and lateral malleoli as well as within the talus. No fracture line. Mild-to-moderate arthropathy throughout the midfoot. No malalignment. Other: Soft tissue edema about the ankle. No organized fluid collection. Nonspecific edema-like signal within the visualized foot musculature. IMPRESSION: Right ankle: 1. Mild tibiotalar osteoarthritis with small joint effusion containing a few small intra-articular loose bodies. 2. Tendinosis and partial-thickness tears of the peroneus longus tendon and peroneus brevis tendon.  3. Partial thickness interstitial tearing of the deltoid ligament without disruption. 4. Patchy marrow edema within the medial  and lateral malleoli as well as within the talus, likely reactive/degenerative or secondary to altered mechanics. 5. Soft tissue edema within the sinus tarsi. Correlate for sinus tarsi syndrome. Electronically Signed   By: Davina Poke D.O.   On: 07/26/2020 10:07   MR ANKLE LEFT WO CONTRAST  Result Date: 07/26/2020 CLINICAL DATA:  Left ankle pain and swelling for 6 months. No known injury EXAM: MRI OF THE LEFT ANKLE WITHOUT CONTRAST TECHNIQUE: Multiplanar, multisequence MR imaging of the ankle was performed. No intravenous contrast was administered. COMPARISON:  X-ray 04/15/2020 FINDINGS: TENDONS Peroneal: Tendinosis with short segment split tear of the infra malleolar portion of the peroneus brevis tendon (series 3, images 25-29). There is tendinosis and partial thickness interstitial tear of the plantar aspect of the peroneus longus tendon distal to the peroneal tubercle (series 4, images 35-37). Posteromedial: Mild tendinosis of the distal tibialis posterior tendon. Intact tibialis posterior, flexor hallucis longus and flexor digitorum longus tendons. An accessory extensor digitorum longus tendon is present. Anterior: Intact tibialis anterior, extensor hallucis longus and extensor digitorum longus tendons. Achilles: Intact. Plantar Fascia: Intact. LIGAMENTS Lateral: The anterior and posterior tibiofibular ligaments are intact. The anterior and posterior talofibular ligaments are intact. Intact calcaneofibular ligament. Medial: Deltoid ligament and spring ligament complex intact. CARTILAGE Ankle Joint: Moderate chondral thinning of the tibiotalar joint with prominent anterolateral endplate osteophytes. Small joint effusion. Tiny intra-articular loose bodies at the posterior joint line. Subtalar Joints/Sinus Tarsi: No joint effusion or chondral defect. Soft tissue edema within the sinus tarsi. Bones: Os trigonum with adjacent soft tissue edema. Mild-moderate arthropathy throughout the midfoot. No acute  fracture. No dislocation. No suspicious bone lesion. Other: Mild soft tissue edema about the ankle. No organized fluid collection. IMPRESSION: Left ankle: 1. Moderate tibiotalar osteoarthritis with small joint effusion and tiny intra-articular loose bodies. 2. Findings which may predispose the patient to both anterior and posterior ankle impingement. 3. Tendinosis and partial thickness tears of the peroneus longus tendon and brevis tendons. 4. Mild tendinosis of the distal tibialis posterior tendon. 5. Soft tissue edema within the sinus tarsi. Correlate for sinus tarsi syndrome. Electronically Signed   By: Davina Poke D.O.   On: 07/26/2020 09:43   I, Victor Huber, personally (independently) visualized and performed the interpretation of the images attached in this note.  Procedure: Real-time Ultrasound Guided Injection of right ankle joint anterior lateral approach Device: Philips Affiniti 50G Images permanently stored and available for review in PACS Verbal informed consent obtained.  Discussed risks and benefits of procedure. Warned about infection bleeding damage to structures skin hypopigmentation and fat atrophy among others. Patient expresses understanding and agreement Time-out conducted.   Noted no overlying erythema, induration, or other signs of local infection.   Skin prepped in a sterile fashion.   Local anesthesia: Topical Ethyl chloride.   With sterile technique and under real time ultrasound guidance:  40 mg of Kenalog and 2 mL of Marcaine injected into ankle joint. Fluid seen entering the joint capsule.   Completed without difficulty   Pain moderately resolved suggesting accurate placement of the medication.   Advised to call if fevers/chills, erythema, induration, drainage, or persistent bleeding.   Images permanently stored and available for review in the ultrasound unit.  Impression: Technically successful ultrasound guided injection.    Procedure: Real-time Ultrasound  Guided Injection of left ankle joint anterior lateral approach Device: Philips Affiniti 50G  Images permanently stored and available for review in PACS Verbal informed consent obtained.  Discussed risks and benefits of procedure. Warned about infection bleeding damage to structures skin hypopigmentation and fat atrophy among others. Patient expresses understanding and agreement Time-out conducted.   Noted no overlying erythema, induration, or other signs of local infection.   Skin prepped in a sterile fashion.   Local anesthesia: Topical Ethyl chloride.   With sterile technique and under real time ultrasound guidance:  40 mg of Kenalog and 2 mL of Marcaine injected into ankle joint. Fluid seen entering the joint capsule.   Completed without difficulty   Pain moderately  resolved suggesting accurate placement of the medication.   Advised to call if fevers/chills, erythema, induration, drainage, or persistent bleeding.   Images permanently stored and available for review in the ultrasound unit.  Impression: Technically successful ultrasound guided injection.         Assessment and Plan: 70 y.o. male with bilateral ankle pain primarily due to impingement and degenerative changes.  Patient does have tendinopathy seen on MRI but that does not seem to be a major component of pain.  Plan for injection today.  Consider physical therapy or her other home exercise program in the future.  Recheck in 1 to 2 months.  Additionally patient is concerned for cortisol deficiency which is not an impossibility.  This is less likely however.  We will go ahead and check a serum fasting cortisol in the morning in a few weeks once the steroid injections today have had a chance to wear off.   PDMP not reviewed this encounter. Orders Placed This Encounter  Procedures  . [place] LIMITED JOINT SPACE STRUCTURES LOW BILAT(NO LINKED CHARGES)    Order Specific Question:   Reason for Exam (SYMPTOM  OR DIAGNOSIS  REQUIRED)    Answer:   B ankle pain    Order Specific Question:   Preferred imaging location?    Answer:   [place]  . Cortisol    Standing Status:   Future    Standing Expiration Date:   07/28/2021   No orders of the defined types were placed in this encounter.    Discussed warning signs or symptoms. Please see discharge instructions. Patient expresses understanding.   The above documentation has been reviewed and is accurate and complete Victor Huber, M.D.  Total encounter time 30 minutes including face-to-face time with the patient and, reviewing past medical record, and charting on the date of service.   Discussed treatment plan and options and potential neck steps.  Time based billing today excludes time to perform the injection.

## 2020-07-28 ENCOUNTER — Other Ambulatory Visit: Payer: Self-pay

## 2020-07-28 ENCOUNTER — Ambulatory Visit: Payer: Medicare HMO | Admitting: Family Medicine

## 2020-07-28 ENCOUNTER — Encounter: Payer: Self-pay | Admitting: Family Medicine

## 2020-07-28 ENCOUNTER — Ambulatory Visit: Payer: Self-pay

## 2020-07-28 ENCOUNTER — Telehealth: Payer: Self-pay | Admitting: *Deleted

## 2020-07-28 VITALS — BP 102/68 | HR 101 | Ht 73.0 in | Wt 195.8 lb

## 2020-07-28 DIAGNOSIS — G8929 Other chronic pain: Secondary | ICD-10-CM

## 2020-07-28 DIAGNOSIS — M25572 Pain in left ankle and joints of left foot: Secondary | ICD-10-CM

## 2020-07-28 DIAGNOSIS — M25561 Pain in right knee: Secondary | ICD-10-CM | POA: Diagnosis not present

## 2020-07-28 DIAGNOSIS — M25571 Pain in right ankle and joints of right foot: Secondary | ICD-10-CM | POA: Diagnosis not present

## 2020-07-28 NOTE — Patient Instructions (Addendum)
Thank you for coming in today.  Schedule fasting cortisol lab in about 2 or 3 weeks.   You had B ankle injections today.  Call or go to the ER if you develop a large red swollen joint with extreme pain or oozing puss.   Keep me updated.   Recheck in about 1-2 months

## 2020-07-28 NOTE — Telephone Encounter (Signed)
Dr. Rush Landmark, This pt is coming in for his EGD on 08-19-20.  Am I ok to use to the same Eliquis hold order from 05-27-20- to hold Eliquis for 2 days prior to procedure.  Please advise.  Thanks, [employer]

## 2020-08-01 ENCOUNTER — Other Ambulatory Visit: Payer: Self-pay | Admitting: Family Medicine

## 2020-08-01 ENCOUNTER — Other Ambulatory Visit: Payer: Self-pay | Admitting: Emergency Medicine

## 2020-08-01 DIAGNOSIS — H2512 Age-related nuclear cataract, left eye: Secondary | ICD-10-CM | POA: Diagnosis not present

## 2020-08-02 NOTE — Telephone Encounter (Signed)
I am OK with this as we have done with prior EGDs. GM

## 2020-08-03 NOTE — Telephone Encounter (Signed)
2 day hold included in prep instructions

## 2020-08-08 ENCOUNTER — Other Ambulatory Visit: Payer: Self-pay

## 2020-08-08 ENCOUNTER — Ambulatory Visit (AMBULATORY_SURGERY_CENTER): Payer: Medicare HMO | Admitting: *Deleted

## 2020-08-08 VITALS — Ht 73.0 in | Wt 195.0 lb

## 2020-08-08 DIAGNOSIS — K222 Esophageal obstruction: Secondary | ICD-10-CM

## 2020-08-08 NOTE — Progress Notes (Signed)
Patient's pre-visit was done today over the phone with the patient due to COVID-19 pandemic. Name,DOB and address verified. Insurance verified. Patient denies any allergies to Eggs and Soy. Patient denies any problems with anesthesia/sedation. Patient denies taking diet pills or blood thinners. Packet of Prep instructions mailed to patient including a copy of a consent form-pt is aware.  Patient understands to call us back with any questions or concerns. Patient is aware of our care-partner policy and [id number] safety protocol.  The patient is COVID-19 vaccinated, per patient. Pt denies any medical hx changes since last GI visit.

## 2020-08-12 ENCOUNTER — Encounter: Payer: Self-pay | Admitting: Internal Medicine

## 2020-08-15 ENCOUNTER — Other Ambulatory Visit: Payer: Self-pay

## 2020-08-15 ENCOUNTER — Other Ambulatory Visit (INDEPENDENT_AMBULATORY_CARE_PROVIDER_SITE_OTHER): Payer: Medicare HMO

## 2020-08-15 ENCOUNTER — Encounter: Payer: Self-pay | Admitting: Internal Medicine

## 2020-08-15 DIAGNOSIS — M25571 Pain in right ankle and joints of right foot: Secondary | ICD-10-CM | POA: Diagnosis not present

## 2020-08-15 DIAGNOSIS — G8929 Other chronic pain: Secondary | ICD-10-CM

## 2020-08-15 DIAGNOSIS — M25561 Pain in right knee: Secondary | ICD-10-CM

## 2020-08-15 DIAGNOSIS — M25572 Pain in left ankle and joints of left foot: Secondary | ICD-10-CM

## 2020-08-15 LAB — CORTISOL: Cortisol, Plasma: 12.9 ug/dL

## 2020-08-15 NOTE — Telephone Encounter (Signed)
I spoke with pts wife regarding this message. I advised the pts is not due for a CT scan and follow-up appt until July, so it is unclear why he has been scheduled for June. I advised for her to schedule the CT scan for July and we will move the lab and follow-up OV to correlate with his CT scan. I also advised that Radiology generally calls pts for scheduling but in the even they do not here from them, we are providing the radiology scheduling phone number for pts to call as they know their personal schedule the best and can schedule accordingly. Mrs. Emma expressed understanding of this information.

## 2020-08-15 NOTE — Progress Notes (Signed)
Morning cortisol levels look to be within normal limits

## 2020-08-15 NOTE — Telephone Encounter (Signed)
Pt has now scheduled his CT scan for 7/13. I have sent a high priority message to move pts lab to an hour before the CT and the follow-up appt with Dr. Julien Nordmann to 7/14 or 7/15 or thereabout.

## 2020-08-19 ENCOUNTER — Encounter: Payer: Self-pay | Admitting: Gastroenterology

## 2020-08-19 ENCOUNTER — Other Ambulatory Visit: Payer: Self-pay

## 2020-08-19 ENCOUNTER — Ambulatory Visit (AMBULATORY_SURGERY_CENTER): Payer: Medicare HMO | Admitting: Gastroenterology

## 2020-08-19 VITALS — BP 95/63 | HR 69 | Temp 97.3°F | Resp 11 | Ht 73.0 in | Wt 195.0 lb

## 2020-08-19 DIAGNOSIS — K222 Esophageal obstruction: Secondary | ICD-10-CM

## 2020-08-19 DIAGNOSIS — K2100 Gastro-esophageal reflux disease with esophagitis, without bleeding: Secondary | ICD-10-CM | POA: Diagnosis not present

## 2020-08-19 DIAGNOSIS — R1310 Dysphagia, unspecified: Secondary | ICD-10-CM | POA: Diagnosis not present

## 2020-08-19 DIAGNOSIS — K200 Eosinophilic esophagitis: Secondary | ICD-10-CM | POA: Diagnosis not present

## 2020-08-19 DIAGNOSIS — K209 Esophagitis, unspecified without bleeding: Secondary | ICD-10-CM | POA: Diagnosis not present

## 2020-08-19 MED ORDER — SODIUM CHLORIDE 0.9 % IV SOLN: 500.0000 mL | Freq: Once | INTRAVENOUS | Status: DC

## 2020-08-19 NOTE — Progress Notes (Signed)
EGD in the next 4-6 weeks to recheck.

## 2020-08-19 NOTE — Op Note (Addendum)
[facility] Patient Name: Victor Huber Procedure Date: 08/19/2020 2:56 PM MRN: [id number] Endoscopist: Justice Britain , MD Age: 70 Referring MD:  Date of Birth: 12-22-1950 Gender: Male Account #: [id number] Procedure:                Upper GI endoscopy Indications:              Stenosis of the esophagus, For therapy of                            esophageal stenosis, Follow-up of esophagitis Medicines:                Monitored Anesthesia Care Procedure:                Pre-Anesthesia Assessment:                           - Prior to the procedure, a History and Physical                            was performed, and patient medications and                            allergies were reviewed. The patient's tolerance of                            previous anesthesia was also reviewed. The risks                            and benefits of the procedure and the sedation                            options and risks were discussed with the patient.                            All questions were answered, and informed consent                            was obtained. Prior Anticoagulants: The patient has                            taken no previous anticoagulant or antiplatelet                            agents. ASA Grade Assessment: II - A patient with                            mild systemic disease. After reviewing the risks                            and benefits, the patient was deemed in                            satisfactory condition to undergo the procedure.  After obtaining informed consent, the endoscope was                            passed under direct vision. Throughout the                            procedure, the patient's blood pressure, pulse, and                            oxygen saturations were monitored continuously. The                            Endoscope was introduced through the mouth, and                            advanced to the  second part of duodenum. The upper                            GI endoscopy was accomplished without difficulty.                            The patient tolerated the procedure. The [id number]                            #[id number] was introduced through the mouth, and                            advanced to the second part of duodenum. Scope In: Scope Out: Findings:                 No gross lesions were noted in the proximal                            esophagus.                           LA Grade C (one or more mucosal breaks continuous                            between tops of 2 or more mucosal folds, less than                            75% circumference) esophagitis with bleeding was                            found 23 to 27 cm from the incisors.                           One moderate (circumferential scarring or stenosis;                            an endoscope may pass) stenosis was found 25 to 27  cm from the incisors. This stenosis measured 1.3 cm                            (inner diameter) x 2 cm (in length). The stenosis                            was traversed. A guidewire was placed and the scope                            was withdrawn. Dilation was performed with a Savary                            dilator with mild resistance at 15 mm and moderate                            resistance at 16 mm. The dilation site was examined                            following endoscope reinsertion and showed moderate                            mucosal disruption, moderate improvement in luminal                            narrowing and no perforation.                           White nummular lesions were noted in the distal                            esophagus. Biopsies were taken with a cold forceps                            for histology.                           The Z-line was irregular and was found 40 cm from                            the incisors.                            Patchy mildly erythematous mucosa without bleeding                            was found in the entire examined stomach.                           No gross lesions were noted in the duodenal bulb,                            in the first portion of the duodenum and in the  second portion of the duodenum. Biopsies were taken                            with a cold forceps for histology. Complications:            No immediate complications. Estimated Blood Loss:     Estimated blood loss was minimal. Impression:               - No gross lesions in esophagus proximally.                           - LA Grade C erosive esophagitis with bleeding from                            23 to 27 cm. Esophageal stenosis present from 25-27                            cm. This was dilated up to 16 mm with appropriate                            mucosal wrenting and oozing. It was not felt safe                            to do further dilation today.                           - White nummular lesions in esophageal mucosa                            distally - biopsied for Candida rule out.                           - Z-line irregular, 40 cm from the incisors.                           - Erythematous mucosa in the stomach - previously                            biopsied.                           - No gross lesions in the duodenal bulb, in the                            first portion of the duodenum and in the second                            portion of the duodenum. Biopsied. Recommendation:           - The patient will be observed post-procedure,                            until all discharge criteria are met.                           -  Discharge patient to home.                           - Patient has a contact number available for                            emergencies. The signs and symptoms of potential                            delayed complications were discussed with  the                            patient. Return to normal activities tomorrow.                            Written discharge instructions were provided to the                            patient.                           - Dilation diet as per protocol.                           - Please use Cepacol or Halls Lozenges +/-                            Chloraseptic spray for next 72-96 hours to aid in                            sore thoat should you experience this.                           - Restart Eliquis in 48 hours to decrease risk of                            post-interventional bleeding (5/22).                           - Continue present medications. Carafate twice                            daily. PPI twice daily.                           - Await pathology results and treat Candida if                            positive.                           - Recommend repeat EGD with dilation with                            Triamcinolone therapy in effort of decreasing  recurrence. Overall, things are improving slowly                            and the esophagitis is decreasing over time (now                            only 4 cm in length as opposed to 7 cm previously).                           - The findings and recommendations were discussed                            with the patient.                           - The findings and recommendations were discussed                            with the patient's family. Justice Britain, MD 08/19/2020 3:37:16 PM

## 2020-08-19 NOTE — Progress Notes (Signed)
To PACU, VSS. Report to [email or web address] 

## 2020-08-19 NOTE — Progress Notes (Signed)
Called to room to assist during endoscopic procedure.  Patient ID and intended procedure confirmed with present staff. Received instructions for my participation in the procedure from the performing physician.  

## 2020-08-19 NOTE — Patient Instructions (Signed)
YOU HAD AN ENDOSCOPIC PROCEDURE TODAY AT THE [facility]:   Refer to the procedure report that was given to you for any specific questions about what was found during the examination.  If the procedure report does not answer your questions, please call your gastroenterologist to clarify.  If you requested that your care partner not be given the details of your procedure findings, then the procedure report has been included in a sealed envelope for you to review at your convenience later.  YOU SHOULD EXPECT: Some feelings of bloating in the abdomen. Passage of more gas than usual.  Walking can help get rid of the air that was put into your GI tract during the procedure and reduce the bloating. If you had a lower endoscopy (such as a colonoscopy or flexible sigmoidoscopy) you may notice spotting of blood in your stool or on the toilet paper. If you underwent a bowel prep for your procedure, you may not have a normal bowel movement for a few days.  Please Note:  You might notice some irritation and congestion in your nose or some drainage.  This is from the oxygen used during your procedure.  There is no need for concern and it should clear up in a day or so.  SYMPTOMS TO REPORT IMMEDIATELY:   Following lower endoscopy (colonoscopy or flexible sigmoidoscopy):  Excessive amounts of blood in the stool  Significant tenderness or worsening of abdominal pains  Swelling of the abdomen that is new, acute  Fever of 100F or higher   Following upper endoscopy (EGD)  Vomiting of blood or coffee ground material  New chest pain or pain under the shoulder blades  Painful or persistently difficult swallowing  New shortness of breath  Fever of 100F or higher  Black, tarry-looking stools  For urgent or emergent issues, a gastroenterologist can be reached at any hour by calling [phone]. Do not use MyChart messaging for urgent concerns.    DIET:  We do recommend a small meal at first, but  then you may proceed to your regular diet.  Drink plenty of fluids but you should avoid alcoholic beverages for 24 hours.  ACTIVITY:  You should plan to take it easy for the rest of today and you should NOT DRIVE or use heavy machinery until tomorrow (because of the sedation medicines used during the test).    FOLLOW UP: Our staff will call the number listed on your records 48-72 hours following your procedure to check on you and address any questions or concerns that you may have regarding the information given to you following your procedure. If we do not reach you, we will leave a message.  We will attempt to reach you two times.  During this call, we will ask if you have developed any symptoms of COVID 19. If you develop any symptoms (ie: fever, flu-like symptoms, shortness of breath, cough etc.) before then, please call [phone].  If you test positive for Covid 19 in the 2 weeks post procedure, please call and report this information to us.    If any biopsies were taken you will be contacted by phone or by letter within the next 1-3 weeks.  Please call us at [phone] if you have not heard about the biopsies in 3 weeks.    SIGNATURES/CONFIDENTIALITY: You and/or your care partner have signed paperwork which will be entered into your electronic medical record.  These signatures attest to the fact that that the information above on   your After Visit Summary has been reviewed and is understood.  Full responsibility of the confidentiality of this discharge information lies with you and/or your care-partner. 

## 2020-08-19 NOTE — Progress Notes (Signed)
Pt's states no medical or surgical changes since previsit or office visit. 

## 2020-08-22 ENCOUNTER — Telehealth: Payer: Self-pay | Admitting: Internal Medicine

## 2020-08-22 NOTE — Telephone Encounter (Signed)
Rescheduled appointment per 05/16 schedule message. Patient is aware.

## 2020-08-23 ENCOUNTER — Telehealth: Payer: Self-pay | Admitting: *Deleted

## 2020-08-23 NOTE — Telephone Encounter (Signed)
  Follow up Call-  Call back number 08/19/2020 03/11/2020  Post procedure Call Back phone  # [phone]  Permission to leave phone message Yes Yes  Some recent data might be hidden     Patient questions:  Message left to call us if necessary.

## 2020-08-23 NOTE — Telephone Encounter (Signed)
  Follow up Call-  Call back number 08/19/2020 03/11/2020  Post procedure Call Back phone  # [phone]  Permission to leave phone message Yes Yes  Some recent data might be hidden     Patient questions:  Do you have a fever, pain , or abdominal swelling? No. Pain Score  0 *  Have you tolerated food without any problems? Yes.    Have you been able to return to your normal activities? Yes.    Do you have any questions about your discharge instructions: Diet   No. Medications  No. Follow up visit  No.  Do you have questions or concerns about your Care? No.  Actions: * If pain score is 4 or above: 1. No action needed, pain <4.Have you developed a fever since your procedure? no  2.   Have you had an respiratory symptoms (SOB or cough) since your procedure? no  3.   Have you tested positive for COVID 19 since your procedure no  4.   Have you had any family members/close contacts diagnosed with the COVID 19 since your procedure?  no   If yes to any of these questions please route to Joylene John, RN and Joella Prince, RN

## 2020-08-30 ENCOUNTER — Encounter: Payer: Self-pay | Admitting: Gastroenterology

## 2020-08-30 DIAGNOSIS — Z0100 Encounter for examination of eyes and vision without abnormal findings: Secondary | ICD-10-CM | POA: Diagnosis not present

## 2020-09-05 ENCOUNTER — Ambulatory Visit (INDEPENDENT_AMBULATORY_CARE_PROVIDER_SITE_OTHER): Payer: Medicare HMO

## 2020-09-05 ENCOUNTER — Telehealth: Payer: Self-pay

## 2020-09-05 DIAGNOSIS — Z0000 Encounter for general adult medical examination without abnormal findings: Secondary | ICD-10-CM

## 2020-09-05 NOTE — Progress Notes (Signed)
Virtual Visit via Telephone Note  I connected with  Victor Huber on 09/05/20 at  3:15 PM EDT by telephone and verified that I am speaking with the correct person using two identifiers.  Medicare Annual Wellness visit completed telephonically due to Covid-19 pandemic.   Persons participating in this call: This Health Coach and this patient.   Location: Patient: Home Provider: Office   I discussed the limitations, risks, security and privacy concerns of performing an evaluation and management service by telephone and the availability of in person appointments. The patient expressed understanding and agreed to proceed.  Unable to perform video visit due to video visit attempted and failed and/or patient does not have video capability.   Some vital signs may be absent or patient reported.   Willette Brace, LPN    Subjective:   Victor Huber is a 70 y.o. male who presents for Medicare Annual/Subsequent preventive examination.  Review of Systems     Cardiac Risk Factors include: advanced age (>74men, >49 women);dyslipidemia;male gender     Objective:    There were no vitals filed for this visit. There is no height or weight on file to calculate BMI.  Advanced Directives 09/05/2020 06/30/2020 05/29/2020 05/23/2020 03/11/2020 02/22/2020 12/31/2019  Does Patient Have a Medical Advance Directive? No No No No No No No  Does patient want to make changes to medical advance directive? - - - - - - -  Would patient like information on creating a medical advance directive? - No - Patient declined No - Patient declined - No - Patient declined No - Patient declined No - Patient declined    Current Medications (verified) Outpatient Encounter Medications as of 09/05/2020  Medication Sig  . acetaminophen (TYLENOL) 500 MG tablet Take 500 mg by mouth every 8 (eight) hours as needed for moderate pain.  Marland Kitchen albuterol (VENTOLIN HFA) 108 (90 Base) MCG/ACT inhaler INHALE 1 TO 2 PUFFS EVERY 4 HOURS  AS NEEDED FOR WHEEZING  OR FOR SHORTNESS OF BREATH  . atenolol (TENORMIN) 50 MG tablet TAKE 1 TABLET EVERY DAY (Patient taking differently: Take 50 mg by mouth daily.)  . atorvastatin (LIPITOR) 40 MG tablet TAKE 1 TABLET EVERY DAY (Patient taking differently: Take 40 mg by mouth daily.)  . ELIQUIS 5 MG TABS tablet TAKE 1 TABLET TWICE DAILY  . esomeprazole (NEXIUM) 40 MG capsule Take 1 capsule (40 mg total) by mouth 2 (two) times daily before a meal.  . fentaNYL (DURAGESIC) 25 MCG/HR Place 1 patch onto the skin every 3 (three) days.  Marland Kitchen gabapentin (NEURONTIN) 300 MG capsule Take 2 capsules (600 mg total) by mouth at bedtime as needed.  Marland Kitchen ibuprofen (ADVIL,MOTRIN) 400 MG tablet Take 400 mg by mouth every 4 (four) hours as needed for moderate pain.  Marland Kitchen levothyroxine (SYNTHROID) 200 MCG tablet TAKE 1 TABLET ONE TIME DAILY BEFORE BREAKFAST  . lipase/protease/amylase (CREON) 36000 UNITS CPEP capsule Take 4 capsules po during each meal. Take 1-2 capsules po during each snack.( up to 2 snacks daily). (Patient taking differently: Take 36,000-72,000 Units by mouth See admin instructions. Take 2 capsules po during each meal. Take 1 capsule po during each snack.( up to 2 snacks daily).)  . sucralfate (CARAFATE) 1 g tablet TAKE 1 TABLET BY MOUTH 4 TIMES DAILY WITH MEALS AND AT BEDTIME  . tamsulosin (FLOMAX) 0.4 MG CAPS capsule Take 1 capsule (0.4 mg total) by mouth daily.  Marland Kitchen venlafaxine XR (EFFEXOR-XR) 37.5 MG 24 hr capsule Take 37.5 mg by  mouth 2 (two) times daily.  . Oxycodone HCl 10 MG TABS Take 1 tablet (10 mg total) by mouth daily as needed (pain). (Patient not taking: No sig reported)  . [DISCONTINUED] DUREZOL 0.05 % EMUL Place 1 drop into the left eye 3 (three) times daily. (Patient not taking: Reported on 09/05/2020)  . [DISCONTINUED] gatifloxacin (ZYMAXID) 0.5 % SOLN Place 1 drop into the left eye 4 (four) times daily. (Patient not taking: Reported on 09/05/2020)  . [DISCONTINUED] PROLENSA 0.07 % SOLN Place 1  drop into the left eye at bedtime. (Patient not taking: No sig reported)  . [DISCONTINUED] sodium chloride (OCEAN) 0.65 % SOLN nasal spray Place 1 spray into both nostrils as needed for congestion. (Patient not taking: No sig reported)   No facility-administered encounter medications on file as of 09/05/2020.    Allergies (verified) Carboplatin, Flecainide, and Debrox [carbamide peroxide]   History: Past Medical History:  Diagnosis Date  . Adenocarcinoma of left lung, stage 3 ([place]) 08/15/2016  . Allergy   . Anxiety   . Arthritis   . Atrial fibrillation ([place]) 08/15/2016  . Atrial fibrillation ([place])   . Cancer, metastatic to bone ([place])   . Cataract    Waiting to schedule bilateral cataract surgery  . COPD (chronic obstructive pulmonary disease) ([place]) 08/15/2016  . Depression   . Dysphagia   . Dysrhythmia    a-fib  . GERD (gastroesophageal reflux disease)   . Heart murmur    atenlol for A Fib/Eliquis  . History of chemotherapy   . History of radiation therapy   . Hyperlipidemia   . Hypothyroid 08/15/2016  . Longstanding persistent atrial fibrillation ([place]) 08/29/2016  . Pathologic fracture    left femur  . Pneumonitis   . S/P TURP 08/15/2016  . Wears glasses   . Wears hearing aid in both ears    Past Surgical History:  Procedure Laterality Date  . BIOPSY  12/21/2019   Procedure: BIOPSY;  Surgeon: Rush Landmark Telford Nab., MD;  Location: [facility];  Service: Gastroenterology;;  . BIOPSY  05/23/2020   Procedure: BIOPSY;  Surgeon: Irving Copas., MD;  Location: [facility];  Service: Gastroenterology;;  . BRONCHOSCOPY  10/2014  . CARDIAC CATHETERIZATION     05/07/12  . CARDIOVERSION     x2  . COLONOSCOPY     several yrs  . DG BIOPSY LUNG Left 10/2014   FNA - Adenocarcinoma   . ESOPHAGOGASTRODUODENOSCOPY (EGD) WITH PROPOFOL N/A 12/21/2019   Procedure: ESOPHAGOGASTRODUODENOSCOPY (EGD) WITH PROPOFOL;  Surgeon: Rush Landmark Telford Nab., MD;  Location: [place] [facility];   Service: Gastroenterology;  Laterality: N/A;  . ESOPHAGOGASTRODUODENOSCOPY (EGD) WITH PROPOFOL N/A 05/23/2020   Procedure: ESOPHAGOGASTRODUODENOSCOPY (EGD) WITH PROPOFOL;  Surgeon: Rush Landmark Telford Nab., MD;  Location: [facility];  Service: Gastroenterology;  Laterality: N/A;  . ESOPHAGOGASTRODUODENOSCOPY (EGD) WITH PROPOFOL N/A 06/30/2020   Procedure: ESOPHAGOGASTRODUODENOSCOPY (EGD) WITH PROPOFOL;  Surgeon: Rush Landmark Telford Nab., MD;  Location: [facility];  Service: Gastroenterology;  Laterality: N/A;  ultra slim scope avail  . EUS N/A 12/21/2019   Procedure: UPPER ENDOSCOPIC ULTRASOUND (EUS) RADIAL;  Surgeon: Irving Copas., MD;  Location: [facility];  Service: Gastroenterology;  Laterality: N/A;  . FEMUR IM NAIL Left 02/19/2017  . FEMUR IM NAIL Left 02/19/2017   Procedure: INTRAMEDULLARY (IM) NAIL FEMORAL;  Surgeon: Marchia Bond, MD;  Location: [facility];  Service: Orthopedics;  Laterality: Left;  . IR FLUORO GUIDE PORT INSERTION RIGHT  07/03/2017  . IR US GUIDE VASC ACCESS RIGHT  07/03/2017  .  LUNG CANCER SURGERY Left 12/2014   Wedge Resection   . MULTIPLE TOOTH EXTRACTIONS    . SAVORY DILATION N/A 05/23/2020   Procedure: SAVORY DILATION;  Surgeon: Rush Landmark Telford Nab., MD;  Location: [facility];  Service: Gastroenterology;  Laterality: N/A;  . SAVORY DILATION N/A 06/30/2020   Procedure: SAVORY DILATION;  Surgeon: Irving Copas., MD;  Location: [facility];  Service: Gastroenterology;  Laterality: N/A;  . Status post TURP    . TONSILLECTOMY     Family History  Problem Relation Age of Onset  . Breast cancer Mother   . Breast cancer Maternal Aunt   . Breast cancer Maternal Aunt   . Lung disease Neg Hx   . Colon cancer Neg Hx   . Stomach cancer Neg Hx   . Pancreatic cancer Neg Hx   . Rectal cancer Neg Hx   . Colon polyps Neg Hx   . Esophageal cancer Neg Hx    Social History   Socioeconomic History  . Marital status: Married    Spouse name: Not on file   . Number of children: Not on file  . Years of education: Not on file  . Highest education level: Not on file  Occupational History    Comment: retired   Tobacco Use  . Smoking status: Former Smoker    Packs/day: 1.00    Years: 50.00    Pack years: 50.00    Types: Cigarettes    Quit date: 08/15/2012    Years since quitting: 8.0  . Smokeless tobacco: Never Used  Vaping Use  . Vaping Use: Never used  Substance and Sexual Activity  . Alcohol use: No  . Drug use: No  . Sexual activity: Not on file  Other Topics Concern  . Not on file  Social History Narrative   [facility] (09/26/16):   Originally from [place]. Moved to [facility] February 2018. Always lived in [place]. Moved to be closer to children & grandchildren. No international travel. 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 No mold exposure. No bird exposure or hot tub exposure. Enjoys reading. Previously enjoyed wood working with domestic woods.    Social Determinants of Health   Financial Resource Strain: Low Risk   . Difficulty of Paying Living Expenses: Not hard at all  Food Insecurity: No Food Insecurity  . Worried About Charity fundraiser in the Last Year: Never true  . Ran Out of Food in the Last Year: Never true  Transportation Needs: No Transportation Needs  . Lack of Transportation (Medical): No  . Lack of Transportation (Non-Medical): No  Physical Activity: Inactive  . Days of Exercise per Week: 0 days  . Minutes of Exercise per Session: 0 min  Stress: No Stress Concern Present  . Feeling of Stress : Not at all  Social Connections: Moderately Isolated  . Frequency of Communication with Friends and Family: Twice a week  . Frequency of Social Gatherings with Friends and Family: Twice a week  . Attends Religious Services: Never  . Active Member of Clubs or Organizations: No  . Attends Archivist Meetings: Never  . Marital Status:  Married    Tobacco Counseling Counseling given: Not Answered   Clinical Intake:  Pre-visit preparation completed: Yes  Pain : No/denies pain     BMI - recorded: 25.73 Nutritional Status: BMI 25 -29 Overweight Nutritional Risks: None Diabetes: No  How often do you need to have  someone help you when you read instructions, pamphlets, or other written materials from your doctor or pharmacy?: 1 - Never  Diabetic?No  Interpreter Needed?: No  Information entered by :: Charlott Rakes, LPN   Activities of Daily Living In your present state of health, do you have any difficulty performing the following activities: 09/05/2020  Hearing? Y  Comment wearing aids  Vision? N  Difficulty concentrating or making decisions? N  Walking or climbing stairs? Y  Dressing or bathing? N  Doing errands, shopping? N  Preparing Food and eating ? N  Using the Toilet? N  In the past six months, have you accidently leaked urine? N  Do you have problems with loss of bowel control? N  Managing your Medications? N  Managing your Finances? N  Housekeeping or managing your Housekeeping? N  Some recent data might be hidden    Patient Care Team: [name], MD as PCP - General (Family Medicine) Skeet Latch, MD as PCP - Cardiology (Cardiology)  Indicate any recent Medical Services you may have received from other than [facility] providers in the past year (date may be approximate).     Assessment:   This is a routine wellness examination for Victor Huber.  Hearing/Vision screen  Hearing Screening   125Hz  250Hz  500Hz  1000Hz  2000Hz  3000Hz  4000Hz  6000Hz  8000Hz   Right ear:           Left ear:           Comments: Pt wears hearing aids   Vision Screening Comments: Pt follows up with Dr Peter Garter for annual eye exams   Dietary issues and exercise activities discussed: Current Exercise Habits: The patient does not participate in regular exercise at present  Goals Addressed            This Visit's  Progress   . Patient Stated       Live  long and pain free      Depression Screen PHQ 2/9 Scores 09/05/2020 07/01/2019 03/10/2019 01/05/2019  PHQ - 2 Score 0 0 0 0    Fall Risk Fall Risk  09/05/2020 03/08/2020 07/06/2019 07/01/2019 03/10/2019  Falls in the past year? 0 1 0 0 1  Number falls in past yr: 0 1 1 0 0  Comment - - hurt shoulder - -  Injury with Fall? 0 1 0 0 0  Comment - - - - -  Follow up Falls prevention discussed - - Education provided;Falls prevention discussed -    FALL RISK PREVENTION PERTAINING TO THE HOME:  Any stairs in or around the home? No  If so, are there any without handrails? No  Home free of loose throw rugs in walkways, pet beds, electrical cords, etc? Yes  Adequate lighting in your home to reduce risk of falls? Yes   ASSISTIVE DEVICES UTILIZED TO PREVENT FALLS:  Life alert? No  Use of a cane, walker or w/c? No  Grab bars in the bathroom? Yes  Shower chair or bench in shower? No  Elevated toilet seat or a handicapped toilet? Yes   TIMED UP AND GO:  Was the test performed? No     Cognitive Function:     6CIT Screen 09/05/2020  What Year? 0 points  What month? 0 points  What time? 0 points  Count back from 20 0 points  Months in reverse 0 points  Repeat phrase 2 points  Total Score 2    Immunizations Immunization History  Administered Date(s) Administered  . Fluad Quad(high Dose  65+) 03/10/2019  . Influenza-Unspecified 03/28/2016, 01/16/2017, 02/01/2020  . PFIZER(Purple Top)SARS-COV-2 Vaccination 05/11/2019, 06/05/2019, 11/19/2019  . Pneumococcal Conjugate-13 11/28/2016  . Pneumococcal-Unspecified 09/27/2014    TDAP status: Due, Education has been provided regarding the importance of this vaccine. Advised may receive this vaccine at local pharmacy or Health Dept. Aware to provide a copy of the vaccination record if obtained from local pharmacy or Health Dept. Verbalized acceptance and understanding.  Flu Vaccine status: Up to  date  Pneumococcal vaccine status: Due, Education has been provided regarding the importance of this vaccine. Advised may receive this vaccine at local pharmacy or Health Dept. Aware to provide a copy of the vaccination record if obtained from local pharmacy or Health Dept. Verbalized acceptance and understanding.  Covid-19 vaccine status: Completed vaccines  Qualifies for Shingles Vaccine? Yes   Zostavax completed No   Shingrix Completed?: No.    Education has been provided regarding the importance of this vaccine. Patient has been advised to call insurance company to determine out of pocket expense if they have not yet received this vaccine. Advised may also receive vaccine at local pharmacy or Health Dept. Verbalized acceptance and understanding.  Screening Tests Health Maintenance  Topic Date Due  . Pneumococcal Vaccine 36-61 Years old (1 of 4 - PCV13) Never done  . Hepatitis C Screening  Never done  . TETANUS/TDAP  Never done  . Zoster Vaccines- Shingrix (1 of 2) Never done  . COVID-19 Vaccine (4 - Booster for [place] series) 02/19/2020  . COLONOSCOPY (Pts 45-51yrs Insurance coverage will need to be confirmed)  03/08/2021 (Originally 09/28/1995)  . INFLUENZA VACCINE  10/31/2020  . PNA vac Low Risk Adult  Completed  . HPV VACCINES  Aged Out    Health Maintenance  Health Maintenance Due  Topic Date Due  . Pneumococcal Vaccine 76-108 Years old (1 of 4 - PCV13) Never done  . Hepatitis C Screening  Never done  . TETANUS/TDAP  Never done  . Zoster Vaccines- Shingrix (1 of 2) Never done  . COVID-19 Vaccine (4 - Booster for [employer] series) 02/19/2020    Colorectal cancer screening: No longer required.  per pt   Additional Screening:  Hepatitis C Screening: does qualify;   Vision Screening: Recommended annual ophthalmology exams for early detection of glaucoma and other disorders of the eye. Is the patient up to date with their annual eye exam?  Yes  Who is the provider or what is  the name of the office in which the patient attends annual eye exams? Dr Peter Garter If pt is not established with a provider, would they like to be referred to a provider to establish care? No .   Dental Screening: Recommended annual dental exams for proper oral hygiene  Community Resource Referral / Chronic Care Management: CRR required this visit?  No   CCM required this visit?  No      Plan:     I have personally reviewed and noted the following in the patient's chart:   . Medical and social history . Use of alcohol, tobacco or illicit drugs  . Current medications and supplements including opioid prescriptions. Patient is currently taking opioid prescriptions. Information provided to patient regarding non-opioid alternatives. Patient advised to discuss non-opioid treatment plan with their provider. . Functional ability and status . Nutritional status . Physical activity . Advanced directives . List of other physicians . Hospitalizations, surgeries, and ER visits in previous 12 months . Vitals . Screenings to include cognitive, depression, and falls .  Referrals and appointments  In addition, I have reviewed and discussed with patient certain preventive protocols, quality metrics, and best practice recommendations. A written personalized care plan for preventive services as well as general preventive health recommendations were provided to patient.     Willette Brace, LPN   11/07/5795   Nurse Notes: None

## 2020-09-05 NOTE — Patient Instructions (Addendum)
Victor Huber , Thank you for taking time to come for your Medicare Wellness Visit. I appreciate your ongoing commitment to your health goals. Please review the following plan we discussed and let me know if I can assist you in the future.   Screening recommendations/referrals: Colonoscopy:Declined  Recommended yearly ophthalmology/optometry visit for glaucoma screening and checkup Recommended yearly dental visit for hygiene and checkup  Vaccinations: Influenza vaccine: Up to date Pneumococcal vaccine: Up to date Tdap vaccine: Due and discussed Shingles vaccine: Shingrix discussed. Please contact your pharmacy for coverage information.    Covid-19: Completed   Advanced directives: Advance directive discussed with you today. Even though you declined this today please call our office should you change your mind and we can give you the proper paperwork for you to fill out.  Conditions/risks identified: live long and pain free  Next appointment: Follow up in one year for your annual wellness visit.   Preventive Care 32 Years and Older, Male Preventive care refers to lifestyle choices and visits with your health care provider that can promote health and wellness. What does preventive care include?  A yearly physical exam. This is also called an annual well check.  Dental exams once or twice a year.  Routine eye exams. Ask your health care provider how often you should have your eyes checked.  Personal lifestyle choices, including:  Daily care of your teeth and gums.  Regular physical activity.  Eating a healthy diet.  Avoiding tobacco and drug use.  Limiting alcohol use.  Practicing safe sex.  Taking low doses of aspirin every day.  Taking vitamin and mineral supplements as recommended by your health care provider. What happens during an annual well check? The services and screenings done by your health care provider during your annual well check will depend on your age,  overall health, lifestyle risk factors, and family history of disease. Counseling  Your health care provider may ask you questions about your:  Alcohol use.  Tobacco use.  Drug use.  Emotional well-being.  Home and relationship well-being.  Sexual activity.  Eating habits.  History of falls.  Memory and ability to understand (cognition).  Work and work Statistician. Screening  You may have the following tests or measurements:  Height, weight, and BMI.  Blood pressure.  Lipid and cholesterol levels. These may be checked every 5 years, or more frequently if you are over 57 years old.  Skin check.  Lung cancer screening. You may have this screening every year starting at age 45 if you have a 30-pack-year history of smoking and currently smoke or have quit within the past 15 years.  Fecal occult blood test (FOBT) of the stool. You may have this test every year starting at age 50.  Flexible sigmoidoscopy or colonoscopy. You may have a sigmoidoscopy every 5 years or a colonoscopy every 10 years starting at age 76.  Prostate cancer screening. Recommendations will vary depending on your family history and other risks.  Hepatitis C blood test.  Hepatitis B blood test.  Sexually transmitted disease (STD) testing.  Diabetes screening. This is done by checking your blood sugar (glucose) after you have not eaten for a while (fasting). You may have this done every 1-3 years.  Abdominal aortic aneurysm (AAA) screening. You may need this if you are a current or former smoker.  Osteoporosis. You may be screened starting at age 46 if you are at high risk. Talk with your health care provider about your test results, treatment  options, and if necessary, the need for more tests. Vaccines  Your health care provider may recommend certain vaccines, such as:  Influenza vaccine. This is recommended every year.  Tetanus, diphtheria, and acellular pertussis (Tdap, Td) vaccine. You may  need a Td booster every 10 years.  Zoster vaccine. You may need this after age 37.  Pneumococcal 13-valent conjugate (PCV13) vaccine. One dose is recommended after age 69.  Pneumococcal polysaccharide (PPSV23) vaccine. One dose is recommended after age 28. Talk to your health care provider about which screenings and vaccines you need and how often you need them. This information is not intended to replace advice given to you by your health care provider. Make sure you discuss any questions you have with your health care provider. Document Released: 04/15/2015 Document Revised: 12/07/2015 Document Reviewed: 01/18/2015 Elsevier Interactive Patient Education  2017 [employer] Prevention in the Home Falls can cause injuries. They can happen to people of all ages. There are many things you can do to make your home safe and to help prevent falls. What can I do on the outside of my home?  Regularly fix the edges of walkways and driveways and fix any cracks.  Remove anything that might make you trip as you walk through a door, such as a raised step or threshold.  Trim any bushes or trees on the path to your home.  Use bright outdoor lighting.  Clear any walking paths of anything that might make someone trip, such as rocks or tools.  Regularly check to see if handrails are loose or broken. Make sure that both sides of any steps have handrails.  Any raised decks and porches should have guardrails on the edges.  Have any leaves, snow, or ice cleared regularly.  Use sand or salt on walking paths during winter.  Clean up any spills in your garage right away. This includes oil or grease spills. What can I do in the bathroom?  Use night lights.  Install grab bars by the toilet and in the tub and shower. Do not use towel bars as grab bars.  Use non-skid mats or decals in the tub or shower.  If you need to sit down in the shower, use a plastic, non-slip stool.  Keep the floor  dry. Clean up any water that spills on the floor as soon as it happens.  Remove soap buildup in the tub or shower regularly.  Attach bath mats securely with double-sided non-slip rug tape.  Do not have throw rugs and other things on the floor that can make you trip. What can I do in the bedroom?  Use night lights.  Make sure that you have a light by your bed that is easy to reach.  Do not use any sheets or blankets that are too big for your bed. They should not hang down onto the floor.  Have a firm chair that has side arms. You can use this for support while you get dressed.  Do not have throw rugs and other things on the floor that can make you trip. What can I do in the kitchen?  Clean up any spills right away.  Avoid walking on wet floors.  Keep items that you use a lot in easy-to-reach places.  If you need to reach something above you, use a strong step stool that has a grab bar.  Keep electrical cords out of the way.  Do not use floor polish or wax that makes floors slippery.  If you must use wax, use non-skid floor wax.  Do not have throw rugs and other things on the floor that can make you trip. What can I do with my stairs?  Do not leave any items on the stairs.  Make sure that there are handrails on both sides of the stairs and use them. Fix handrails that are broken or loose. Make sure that handrails are as long as the stairways.  Check any carpeting to make sure that it is firmly attached to the stairs. Fix any carpet that is loose or worn.  Avoid having throw rugs at the top or bottom of the stairs. If you do have throw rugs, attach them to the floor with carpet tape.  Make sure that you have a light switch at the top of the stairs and the bottom of the stairs. If you do not have them, ask someone to add them for you. What else can I do to help prevent falls?  Wear shoes that:  Do not have high heels.  Have rubber bottoms.  Are comfortable and fit you  well.  Are closed at the toe. Do not wear sandals.  If you use a stepladder:  Make sure that it is fully opened. Do not climb a closed stepladder.  Make sure that both sides of the stepladder are locked into place.  Ask someone to hold it for you, if possible.  Clearly mark and make sure that you can see:  Any grab bars or handrails.  First and last steps.  Where the edge of each step is.  Use tools that help you move around (mobility aids) if they are needed. These include:  Canes.  Walkers.  Scooters.  Crutches.  Turn on the lights when you go into a dark area. Replace any light bulbs as soon as they burn out.  Set up your furniture so you have a clear path. Avoid moving your furniture around.  If any of your floors are uneven, fix them.  If there are any pets around you, be aware of where they are.  Review your medicines with your doctor. Some medicines can make you feel dizzy. This can increase your chance of falling. Ask your doctor what other things that you can do to help prevent falls. This information is not intended to replace advice given to you by your health care provider. Make sure you discuss any questions you have with your health care provider. Document Released: 01/13/2009 Document Revised: 08/25/2015 Document Reviewed: 04/23/2014 Elsevier Interactive Patient Education  2017 [employer].

## 2020-09-07 ENCOUNTER — Other Ambulatory Visit: Payer: Medicare HMO

## 2020-09-08 ENCOUNTER — Ambulatory Visit: Payer: Medicare HMO | Admitting: Internal Medicine

## 2020-09-08 ENCOUNTER — Other Ambulatory Visit: Payer: Self-pay | Admitting: Gastroenterology

## 2020-09-12 ENCOUNTER — Ambulatory Visit (INDEPENDENT_AMBULATORY_CARE_PROVIDER_SITE_OTHER): Payer: Medicare HMO

## 2020-09-12 ENCOUNTER — Other Ambulatory Visit: Payer: Self-pay

## 2020-09-12 ENCOUNTER — Ambulatory Visit (INDEPENDENT_AMBULATORY_CARE_PROVIDER_SITE_OTHER): Payer: Medicare HMO | Admitting: Family Medicine

## 2020-09-12 VITALS — BP 130/86 | HR 69 | Ht 73.0 in | Wt 198.4 lb

## 2020-09-12 DIAGNOSIS — S92311A Displaced fracture of first metatarsal bone, right foot, initial encounter for closed fracture: Secondary | ICD-10-CM | POA: Insufficient documentation

## 2020-09-12 DIAGNOSIS — M79671 Pain in right foot: Secondary | ICD-10-CM | POA: Diagnosis not present

## 2020-09-12 NOTE — Progress Notes (Signed)
   I, Victor Huber, LAT, ATC acting as a scribe for Victor Leader, MD.  Victor Huber is a 70 y.o. male who presents to [place] at [facility] today for f/u bilat ankle and knee pain. Pt completed bilat knee Orthovisc series on 07/12/20. Pt was last seen by Dr. Georgina Huber on 07/28/20 and was given bilat ankle steroid injections and a serum fasting cortisol was checked to address pt's concern for cortisol deficiency. Today, pt reports both ankles and knees are doing great. Pt reports he dropped a big heavy board on her R foot 4 weeks ago. Pt states his foot doesn't really hurt, but his family wants it to be looked at.  R some swelling dorsal swelling: yes  Dx testing: 08/15/20 Labs (Cortisol) 07/25/20 R & L ankle MRI             05/02/20 R & L knee XR             04/15/20 R & L ankle XR  Pertinent review of systems: No fevers or chills  Relevant historical information: Lung cancer   Exam:  BP 130/86 (BP Location: Right Arm, Patient Position: Sitting, Cuff Size: Normal)   Pulse 69   Ht 6\' 1"  (1.854 m)   Wt 198 lb 6.4 oz (90 kg)   SpO2 95%   BMI 26.18 kg/m  General: Well Developed, well nourished, and in no acute distress.   MSK: Right foot swollen dorsal forefoot nontender.  Palpable nodule dorsal first metatarsal.  Also nontender.    Lab and Radiology Results  X-ray images right foot obtained today personally and independently interpreted Mildly displaced fracture midshaft 1st metatarsal shaft.  Significant callus formation present surrounding fracture. Await formal radiology review     Assessment and Plan: 70 y.o. male with fracture first metatarsal shaft.  This fracture probably occurred about a month ago when he dropped a heavy object onto his foot.  He has a lot of good bone healing on x-ray today which is excellent.  Fortunately he is not having a lot of pain.  Plan for immobilization with turf toe insole and recheck in about 2 weeks. Of note x-ray was  obtained after the visit.  I called patient at the end of the day and informed him of the fracture and advised follow-up in 2 weeks.   PDMP not reviewed this encounter. Orders Placed This Encounter  Procedures   DG Foot Complete Right    Standing Status:   Future    Number of Occurrences:   1    Standing Expiration Date:   09/12/2021    Order Specific Question:   Reason for Exam (SYMPTOM  OR DIAGNOSIS REQUIRED)    Answer:   right foot pain    Order Specific Question:   Preferred imaging location?    Answer:   [facility]   No orders of the defined types were placed in this encounter.    Discussed warning signs or symptoms. Please see discharge instructions. Patient expresses understanding.   The above documentation has been reviewed and is accurate and complete Victor Huber, M.D.

## 2020-09-12 NOTE — Patient Instructions (Addendum)
Thank you for coming in today.   Please get an Xray of your foot today before you leave   Assuming no surprises recheck as needed.

## 2020-09-13 NOTE — Progress Notes (Signed)
Pt viewed Dr. Clovis Riley result note via [place].

## 2020-09-13 NOTE — Progress Notes (Signed)
Fracture present first metatarsal shaft.  Mild displacement present.  Fracture is healing but not fully healed.

## 2020-09-20 NOTE — Telephone Encounter (Signed)
Follow up cal made.

## 2020-09-22 NOTE — Progress Notes (Signed)
   I, [name], LAT, ATC acting as a scribe for Victor Leader, MD.  Victor Huber is a 70 y.o. male who presents to [place] at [facility] today for R 1st MT fx that he injured about 6 weeks ago when a big, heavy board landed on his R foot. Pt was last seen by Dr. [name] on 09/12/20 and was advised to immobilize w/ a turf toe insole. Today, pt reports foot is not bothering him and he is not having any pain currently.   Dx imaging: 09/12/20 R foot XR 07/25/20 R & L ankle MRI             05/02/20 R & L knee XR             04/15/20 R & L ankle XR  Pertinent review of systems: No fevers or chills  Relevant historical information: Lung cancer   Exam:  BP (!) 140/96   Pulse 96   Wt 199 lb 12.8 oz (90.6 kg)   SpO2 91%   BMI 26.36 kg/m  General: Well Developed, well nourished, and in no acute distress.   MSK: Right foot nodule visible and palpable first metatarsal otherwise normal. Nontender.  Normal foot and ankle motion.    Lab and Radiology Results  X-ray images right foot obtained today personally and independently interpreted Mildly displaced fracture first MTP with good callus formation.  No change in displacement from prior x-ray. Await formal radiology review     Assessment and Plan: 70 y.o. male with fracture first MTP healing well.  Continue immobilization.  Repeat x-ray in 1 month.  Patient clinically is doing very well.  Recheck as needed after x-ray in 1 month.  Discussed modification of footwear to make it a little more comfortable.   PDMP not reviewed this encounter. Orders Placed This Encounter  Procedures   DG Foot Complete Right    Standing Status:   Future    Number of Occurrences:   1    Standing Expiration Date:   10/26/2020    Order Specific Question:   Reason for Exam (SYMPTOM  OR DIAGNOSIS REQUIRED)    Answer:   R foot pain    Order Specific Question:   Preferred imaging location?    Answer:   [facility]   DG Foot  Complete Right    Standing Status:   Future    Standing Expiration Date:   09/26/2021    Order Specific Question:   Reason for Exam (SYMPTOM  OR DIAGNOSIS REQUIRED)    Answer:   eval foot fx    Order Specific Question:   Preferred imaging location?    Answer:   [facility]   No orders of the defined types were placed in this encounter.    Discussed warning signs or symptoms. Please see discharge instructions. Patient expresses understanding.   The above documentation has been reviewed and is accurate and complete Victor Huber, M.D.

## 2020-09-26 ENCOUNTER — Ambulatory Visit: Payer: Medicare HMO | Admitting: Family Medicine

## 2020-09-26 ENCOUNTER — Ambulatory Visit (INDEPENDENT_AMBULATORY_CARE_PROVIDER_SITE_OTHER): Payer: Medicare HMO

## 2020-09-26 ENCOUNTER — Other Ambulatory Visit: Payer: Self-pay

## 2020-09-26 VITALS — BP 140/96 | HR 96 | Wt 199.8 lb

## 2020-09-26 DIAGNOSIS — S92311D Displaced fracture of first metatarsal bone, right foot, subsequent encounter for fracture with routine healing: Secondary | ICD-10-CM | POA: Diagnosis not present

## 2020-09-26 DIAGNOSIS — M79671 Pain in right foot: Secondary | ICD-10-CM | POA: Diagnosis not present

## 2020-09-26 NOTE — Patient Instructions (Addendum)
Thank you for coming in today.   Continue a little restrictive activity.   Get a repeat xray in about 1 month.  Ok to schedule that now.  I will look at it and we can make a decision on need for visit.   Let me know if you have a problem.

## 2020-09-27 NOTE — Progress Notes (Signed)
X-ray right foot shows that the fracture continues to heal.

## 2020-10-04 ENCOUNTER — Encounter: Payer: Self-pay | Admitting: Gastroenterology

## 2020-10-04 ENCOUNTER — Ambulatory Visit: Payer: Medicare HMO | Admitting: Gastroenterology

## 2020-10-04 VITALS — BP 118/60 | HR 88 | Ht 73.0 in | Wt 204.5 lb

## 2020-10-04 DIAGNOSIS — K861 Other chronic pancreatitis: Secondary | ICD-10-CM

## 2020-10-04 DIAGNOSIS — K8681 Exocrine pancreatic insufficiency: Secondary | ICD-10-CM

## 2020-10-04 DIAGNOSIS — R198 Other specified symptoms and signs involving the digestive system and abdomen: Secondary | ICD-10-CM

## 2020-10-04 DIAGNOSIS — K222 Esophageal obstruction: Secondary | ICD-10-CM

## 2020-10-04 DIAGNOSIS — K209 Esophagitis, unspecified without bleeding: Secondary | ICD-10-CM | POA: Diagnosis not present

## 2020-10-04 NOTE — Patient Instructions (Signed)
If you are age 72 or older, your body mass index should be between 23-30. Your Body mass index is 26.98 kg/m. If this is out of the aforementioned range listed, please consider follow up with your Primary Care Provider.   You have been scheduled for an endoscopy. Please follow written instructions given to you at your visit today. If you use inhalers (even only as needed), please bring them with you on the day of your procedure.   You have been scheduled for a Barium Esophogram at [facility] (1st floor of the hospital) on 10/28/20 at 9:30am. Please arrive 15 minutes prior to your appointment for registration. Make certain not to have anything to eat or drink 3 hours prior to your test. If you need to reschedule for any reason, please contact radiology at [phone] to do so. __________________________________________________________________ A barium swallow is an examination that concentrates on views of the esophagus. This tends to be a double contrast exam (barium and two liquids which, when combined, create a gas to distend the wall of the oesophagus) or single contrast (non-ionic iodine based). The study is usually tailored to your symptoms so a good history is essential. Attention is paid during the study to the form, structure and configuration of the esophagus, looking for functional disorders (such as aspiration, dysphagia, achalasia, motility and reflux) EXAMINATION You may be asked to change into a gown, depending on the type of swallow being performed. A radiologist and radiographer will perform the procedure. The radiologist will advise you of the type of contrast selected for your procedure and direct you during the exam. You will be asked to stand, sit or lie in several different positions and to hold a small amount of fluid in your mouth before being asked to swallow while the imaging is performed .In some instances you may be asked to swallow barium coated marshmallows to  assess the motility of a solid food bolus. The exam can be recorded as a digital or video fluoroscopy procedure. POST PROCEDURE It will take 1-2 days for the barium to pass through your system. To facilitate this, it is important, unless otherwise directed, to increase your fluids for the next 24-48hrs and to resume your normal diet.  This test typically takes about 30 minutes to perform. __________________________________________________________________________________   Dennis Bast will be contaced by our office prior to your procedure for directions on holding your Eliquis.  If you do not hear from our office 1 week prior to your scheduled procedure, please call [phone] to discuss.   The [facility] providers would like to encourage you to use [facility] to communicate with providers for non-urgent requests or questions.  Due to long hold times on the telephone, sending your provider a message by [facility] may be a faster and more efficient way to get a response.  Please allow 48 business hours for a response.  Please remember that this is for non-urgent requests.   Thank you for choosing me and [facility].  Dr. Rush Landmark

## 2020-10-05 ENCOUNTER — Encounter: Payer: Self-pay | Admitting: Gastroenterology

## 2020-10-05 DIAGNOSIS — K222 Esophageal obstruction: Secondary | ICD-10-CM | POA: Insufficient documentation

## 2020-10-05 DIAGNOSIS — K861 Other chronic pancreatitis: Secondary | ICD-10-CM | POA: Insufficient documentation

## 2020-10-05 DIAGNOSIS — R198 Other specified symptoms and signs involving the digestive system and abdomen: Secondary | ICD-10-CM | POA: Insufficient documentation

## 2020-10-05 NOTE — Progress Notes (Signed)
[place] VISIT   Primary Care Provider [name], [place] [address] [phone]  Patient Profile: Victor Huber is a 70 y.o. male with a pmh significant for asthma/COPD, metastatic lung cancer, atrial fibrillation (on anticoagulation), BPH (status post TURP), GERD, chronic pancreatitis (based on EUS), EPI, idiopathic pancreatitis.  The patient presents to the [facility] for an evaluation and management of problem(s) noted below:  Problem List 1. Esophageal stricture   2. Esophagitis   3. Abnormal findings on esophagogastroduodenoscopy (EGD)   4. Exocrine pancreatic insufficiency   5. Idiopathic chronic pancreatitis (HCC)     History of Present Illness Please see initial consultation note and progress notes by NP [facility] and myself for full details of HPI.  Interval History The patient is accompanied by his wife and son today.  When I had last seen the patient at time of his EGD in May, he had been doing better with his swallowing and his wife confirmed this.  I was only able to dilated back up to 16 mm.  Patient feels at this time things have worsened again.  He is having pill dysphagia and solid food dysphagia as well even after his dilation.  They want answers as to why this is not improving.  His weight has been stable thankfully.  Today we re-reviewed all off the patients EGDs since September of last year.  He remains on PERT therapy at this time, and likely will need to continue this.  They have concerns financially if they will be able to The patient states that he has been doing well since our endoscopic ultrasound.  Does not describe significant history of prior GERD but now that he has been on PPI therapy he feels his swallowing is much improved.  He has questions in regards to whether chronic pancreatitis will persist or if he will have any recurrence of his episode of pancreatitis and if he will  heal his pancreas.  Remains on PERT therapy with 2 pills at meals and 1 with each snack although we had wanted to increase him.  He is on patient assistance to [place].  Patient is eating well.  Does not describe significant abdominal pain or discomfort.  He is not having unintentional weight loss.  He denies any jaundice or dark urine.  GI Review of Systems Positive as above Negative for pyrosis, dysphagia, odynophagia, nausea, vomiting, change in bowel habits, melena, hematochezia  Review of Systems General: Denies fevers/chills/weight loss unintentionally Cardiovascular: Denies chest pain Pulmonary: Denies shortness of breath Gastroenterological: See HPI Genitourinary: Denies darkened urine Hematological: Positive for history of easy bruising/bleeding due to anticoagulation Dermatological: Denies jaundice Psychological: Mood is stable   Medications Current Outpatient Medications  Medication Sig Dispense Refill   acetaminophen (TYLENOL) 500 MG tablet Take 500 mg by mouth every 8 (eight) hours as needed for moderate pain.     albuterol (VENTOLIN HFA) 108 (90 Base) MCG/ACT inhaler INHALE 1 TO 2 PUFFS EVERY 4 HOURS AS NEEDED FOR WHEEZING  OR FOR SHORTNESS OF BREATH 18 g 3   atenolol (TENORMIN) 50 MG tablet TAKE 1 TABLET EVERY DAY 90 tablet 3   atorvastatin (LIPITOR) 40 MG tablet TAKE 1 TABLET EVERY DAY 90 tablet 2   ELIQUIS 5 MG TABS tablet TAKE 1 TABLET TWICE DAILY 180 tablet 1   esomeprazole (NEXIUM) 40 MG capsule Take 1 capsule (40 mg total) by mouth 2 (two) times daily before a meal. 180 capsule 3  gabapentin (NEURONTIN) 300 MG capsule Take 2 capsules (600 mg total) by mouth at bedtime as needed. 180 capsule 3   ibuprofen (ADVIL,MOTRIN) 400 MG tablet Take 400 mg by mouth every 4 (four) hours as needed for moderate pain.     levothyroxine (SYNTHROID) 200 MCG tablet TAKE 1 TABLET ONE TIME DAILY BEFORE BREAKFAST 90 tablet 0   lipase/protease/amylase (CREON) 36000 UNITS CPEP capsule Take  4 capsules po during each meal. Take 1-2 capsules po during each snack.( up to 2 snacks daily). (Patient taking differently: Take 36,000-72,000 Units by mouth See admin instructions. Take 2 capsules po during each meal. Take 1 capsule po during each snack.( up to 2 snacks daily).) 480 capsule 3   Oxycodone HCl 10 MG TABS Take 1 tablet (10 mg total) by mouth daily as needed (pain). 30 tablet 0   sucralfate (CARAFATE) 1 g tablet TAKE 1 TABLET BY MOUTH 4 TIMES DAILY WITH MEALS AND AT BEDTIME 90 tablet 0   tamsulosin (FLOMAX) 0.4 MG CAPS capsule Take 1 capsule (0.4 mg total) by mouth daily. 90 capsule 4   venlafaxine XR (EFFEXOR-XR) 37.5 MG 24 hr capsule Take 37.5 mg by mouth 2 (two) times daily.     No current facility-administered medications for this visit.    Allergies Allergies  Allergen Reactions   Carboplatin Itching, Nausea And Vomiting and Other (See Comments)    Flushing   Flecainide Hypertension    CAUSED HEART ISSUES    Debrox [Carbamide Peroxide]     Swelling in ear canal.     Histories Past Medical History:  Diagnosis Date   Adenocarcinoma of left lung, stage 3 (HCC) 08/15/2016   Allergy    Anxiety    Arthritis    Atrial fibrillation ([place]) 08/15/2016   Atrial fibrillation (HCC)    Cancer, metastatic to bone [facility])    Cataract    Waiting to schedule bilateral cataract surgery   COPD (chronic obstructive pulmonary disease) ([place]) 08/15/2016   Depression    Dysphagia    Dysrhythmia    a-fib   GERD (gastroesophageal reflux disease)    Heart murmur    atenlol for A Fib/Eliquis   History of chemotherapy    History of radiation therapy    Hyperlipidemia    Hypothyroid 08/15/2016   Longstanding persistent atrial fibrillation ([place]) 08/29/2016   Pathologic fracture    left femur   Pneumonitis    S/P TURP 08/15/2016   Wears glasses    Wears hearing aid in both ears    Past Surgical History:  Procedure Laterality Date   BIOPSY  12/21/2019   Procedure: BIOPSY;  Surgeon:  Irving Copas., MD;  Location: [place];  Service: Gastroenterology;;   BIOPSY  05/23/2020   Procedure: BIOPSY;  Surgeon: Irving Copas., MD;  Location: [facility];  Service: Gastroenterology;;   BRONCHOSCOPY  10/2014   CARDIAC CATHETERIZATION     05/07/12   CARDIOVERSION     x2   COLONOSCOPY     several yrs   DG BIOPSY LUNG Left 10/2014   FNA - Adenocarcinoma    ESOPHAGOGASTRODUODENOSCOPY (EGD) WITH PROPOFOL N/A 12/21/2019   Procedure: ESOPHAGOGASTRODUODENOSCOPY (EGD) WITH PROPOFOL;  Surgeon: Irving Copas., MD;  Location[facility];  Service: Gastroenterology;  Laterality: N/A;   ESOPHAGOGASTRODUODENOSCOPY (EGD) WITH PROPOFOL N/A 05/23/2020   Procedure: ESOPHAGOGASTRODUODENOSCOPY (EGD) WITH PROPOFOL;  Surgeon: Rush Landmark Telford Nab., MD;  Location: [facility];  Service: Gastroenterology;  Laterality: N/A;   ESOPHAGOGASTRODUODENOSCOPY (EGD) WITH PROPOFOL N/A 06/30/2020  Procedure: ESOPHAGOGASTRODUODENOSCOPY (EGD) WITH PROPOFOL;  Surgeon: Rush Landmark Telford Nab., MD;  Location: [place];  Service: Gastroenterology;  Laterality: N/A;  ultra slim scope avail   EUS N/A 12/21/2019   Procedure: UPPER ENDOSCOPIC ULTRASOUND (EUS) RADIAL;  Surgeon: Irving Copas., MD;  Location: [place];  Service: Gastroenterology;  Laterality: N/A;   FEMUR IM NAIL Left 02/19/2017   FEMUR IM NAIL Left 02/19/2017   Procedure: INTRAMEDULLARY (IM) NAIL FEMORAL;  Surgeon: Marchia Bond, MD;  Location: [place];  Service: Orthopedics;  Laterality: Left;   IR FLUORO GUIDE PORT INSERTION RIGHT  07/03/2017   IR US GUIDE VASC ACCESS RIGHT  07/03/2017   LUNG CANCER SURGERY Left 12/2014   Wedge Resection    MULTIPLE TOOTH EXTRACTIONS     SAVORY DILATION N/A 05/23/2020   Procedure: SAVORY DILATION;  Surgeon: Irving Copas., MD;  Location: [facility];  Service: Gastroenterology;  Laterality: N/A;   SAVORY DILATION N/A 06/30/2020   Procedure: SAVORY DILATION;  Surgeon:  Rush Landmark Telford Nab., MD;  Location: [place];  Service: Gastroenterology;  Laterality: N/A;   Status post TURP     TONSILLECTOMY     Social History   Socioeconomic History   Marital status: Married    Spouse name: Not on file   Number of children: Not on file   Years of education: Not on file   Highest education level: Not on file  Occupational History    Comment: retired   Tobacco Use   Smoking status: Former    Packs/day: 1.00    Years: 50.00    Pack years: 50.00    Types: Cigarettes    Quit date: 08/15/2012    Years since quitting: 8.1   Smokeless tobacco: Never  Vaping Use   Vaping Use: Never used  Substance and Sexual Activity   Alcohol use: No   Drug use: No   Sexual activity: Not on file  Other Topics Concern   Not on file  Social History Narrative   [facility] (09/26/16):   Originally from [place]. Moved to [facility] February 2018. Always lived in [place]. Moved to be closer to children & grandchildren. No international travel. 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 No mold exposure. No bird exposure or hot tub exposure. Enjoys reading. Previously enjoyed wood working with domestic woods.    Social Determinants of Health   Financial Resource Strain: Low Risk    Difficulty of Paying Living Expenses: Not hard at all  Food Insecurity: No Food Insecurity   Worried About Charity fundraiser in the Last Year: Never true   [place] in the Last Year: Never true  Transportation Needs: No Transportation Needs   Lack of Transportation (Medical): No   Lack of Transportation (Non-Medical): No  Physical Activity: Inactive   Days of Exercise per Week: 0 days   Minutes of Exercise per Session: 0 min  Stress: No Stress Concern Present   Feeling of Stress : Not at all  Social Connections: Moderately Isolated   Frequency of Communication with Friends and Family: Twice a week   Frequency of  Social Gatherings with Friends and Family: Twice a week   Attends Religious Services: Never   Marine scientist or Organizations: No   Attends Archivist Meetings: Never   Marital Status: Married  Human resources officer Violence: Not At Risk   Fear of Current or Ex-Partner: No   Emotionally  Abused: No   Physically Abused: No   Sexually Abused: No   Family History  Problem Relation Age of Onset   Breast cancer Mother    Breast cancer Maternal Aunt    Breast cancer Maternal Aunt    Lung disease Neg Hx    Colon cancer Neg Hx    Stomach cancer Neg Hx    Pancreatic cancer Neg Hx    Rectal cancer Neg Hx    Colon polyps Neg Hx    Esophageal cancer Neg Hx    I have reviewed his medical, social, and family history in detail and updated the electronic medical record as necessary.    PHYSICAL EXAMINATION  BP 118/60 (BP Location: Left Arm, Patient Position: Sitting, Cuff Size: Normal)   Pulse 88   Ht 6\' 1"  (1.854 m)   Wt 204 lb 8 oz (92.8 kg)   SpO2 100%   BMI 26.98 kg/m  Wt Readings from Last 3 Encounters:  10/04/20 204 lb 8 oz (92.8 kg)  09/26/20 199 lb 12.8 oz (90.6 kg)  09/12/20 198 lb 6.4 oz (90 kg)  GEN: NAD, appears stated age, doesn't appear chronically ill, accompanied by wife PSYCH: Cooperative, without pressured speech EYE: Conjunctivae pink, sclerae anicteric ENT: MMM CV: Nontachycardic RESP: No audible wheezing GI: NABS, soft, rounded, NT/ND, without rebound or guarding MSK/EXT: No lower extremity edema SKIN: No jaundice NEURO:  Alert & Oriented x 3, no focal deficits   REVIEW OF DATA  I reviewed the following data at the time of this encounter:  GI Procedures and Studies  September 2021 EGD/EUS EGD Impression: - No gross lesions in esophagus proximally. - LA Grade D esophagitis with bleeding in the middle and distal esophagus with some nodularity and some deep ulcerations. Biopsied. - Z-line regular, 40 cm from the incisors. - 4 cm hiatal  hernia. - Erythematous mucosa in the antrum. Biopsied. - Nodule found in the duodenum. Biopsied. - Duodenal scar in setting of a healing ulcer.. - Duodenal erosions without bleeding. - Mild inflammation noted on the ampulla. Biopsied. EUS Impression: - Pancreatic parenchymal abnormalities consisting of hyperechoic foci, lobularity with honeycombing and a cyst were noted in the entire pancreas. This meets EUS criteria Major A + Major B for diagnosis of likely Chronic Pancreatitis - A cystic lesion was seen in the pancreatic head. The diagnosis is a pancreatic pseudocyst. - The pancreatic duct had a normal endosonographic appearance in the pancreatic head, genu of the pancreas, body of the pancreas and tail of the pancreas. - There was no sign of significant pathology in the common bile duct and in the common hepatic duct. - Many stones were visualized endosonographically in the gallbladder. - A cyst was found in the visualized portion of the liver but no other significant findings noted. - No malignant-appearing lymph nodes were visualized in the celiac region (level 20), perigastric region, peripancreatic region and porta hepatis region.  December 2021 EGD - LA Grade D erosive esophagitis with bleeding in middle esophagus - not significantly better than prior. Biopsied. - Esophageal stenosis - traversed and dilated by endoscope with mucosal wrenting. - Z-line irregular, 40 cm from the incisors. - 2 cm hiatal hernia. - No gross mucosa lesions in the stomach. - No gross lesions in the duodenal bulb, in the first portion of the duodenum and in the second portion of the duodenum.  February 2022 EGD - No gross lesions in esophagus proximally. - LA Grade D erosive esophagitis with bleeding (25-31 cm) -  slightly improved from prior. Biopsied. - No gross lesions in esophagus distally. - Z-line regular, 40 cm from the incisors. - Dilation performed in the entire esophagus with mucosal wrent  being noted after dilation up to 14 mm. - 3 cm hiatal hernia. - A medium amount of food (residue) in the stomach. Unable to visualize gastric mucosa. - No gross lesions in the duodenal bulb, in the first portion of the duodenum and in the second portion of the duodenum.  March 2022 EGD - LA Grade C esophagitis with no bleeding in mid-esophagus - this is slowly improving over time but looks better than it did 4-weeks ago. This has led to esophageal stricturing in region. Dilated. We have biopsied previously and non-malignant. - No gross lesions in esophagus in proximal/distal esophagus. - 3 cm hiatal hernia. - Erythematous mucosa in the antrum. No other gross lesions in the stomach. Previously biopsied. - No gross lesions in the duodenal bulb, in the first portion of the duodenum and in the second portion of the duodenum.  May 2022 EGD - No gross lesions in esophagus proximally. - LA Grade C erosive esophagitis with bleeding from 23 to 27 cm. Esophageal stenosis present from 25-27 cm. This was dilated up to 16 mm with appropriate mucosal wrenting and oozing. It was not felt safe to do further dilation today. - White nummular lesions in esophageal mucosa distally - biopsied for Candida rule out. - Z-line irregular, 40 cm from the incisors. - Erythematous mucosa in the stomach - previously biopsied. - No gross lesions in the duodenal bulb, in the first portion of the duodenum and in the second portion of the duodenum. Biopsied.  Laboratory Studies  Reviewed those in epic  Imaging Studies  No relevant studies to review   ASSESSMENT  Mr. Dumlao is a 70 y.o. male with a pmh significant for asthma/COPD, metastatic lung cancer, atrial fibrillation (on anticoagulation), BPH (status post TURP), GERD, chronic pancreatitis (based on EUS), EPI, idiopathic pancreatitis.  The patient is seen today for evaluation and management of:  1. Esophageal stricture   2. Esophagitis   3. Abnormal  findings on esophagogastroduodenoscopy (EGD)   4. Exocrine pancreatic insufficiency   5. Idiopathic chronic pancreatitis [facility])    The patient is hemodynamically stable.  Clinically, he seems to be experiencing progressive dysphagia symptoms once again.  I think the next step in his evaluation is going to be repeat EGD with dilation and steroid injection.  I am going to move forward with a barium swallow to better define things as well.  There could be an issue of dysmotility playing a role also but since he structurally has had an issue suspect this remains the biggest problem.  In regards to his exocrine pancreas insufficiency we discussed the findings of his chronic idiopathic pancreatitis.  He has EPI based on his previous stool evaluation I think he will benefit from continuing to be on Creon and hopefully [employer] will be able to continue to help from a financial standpoint.  He will continue his PPI therapy and Carafate therapy.  The risks and benefits of endoscopic evaluation were discussed with the patient; these include but are not limited to the risk of perforation, infection, bleeding, missed lesions, lack of diagnosis, severe illness requiring hospitalization, as well as anesthesia and sedation related illnesses.  The patient and/or family is agreeable to proceed.  All patient questions were answered to the best of my ability, and the patient agrees to the aforementioned plan of action  with follow-up as indicated.   PLAN  Continue Nexium 40 mg twice daily Continue Carafate 2-4 times daily (ideally would be on liquids but if cannot then tablet based slurry can be considered/used) Proceed with scheduling barium swallow Repeat EGD with dilation and triamcinolone steroid injection to decrease risk of recurrence of stricture to be scheduled Continue PERT therapy -Okay to maintain 1 to 2 pills with each meal and 1 with each snack We will consider esophageal manometry in future   Orders Placed  This Encounter  Procedures   DG ESOPHAGUS W SINGLE CM (SOL OR THIN BA)   Ambulatory referral to Gastroenterology    New Prescriptions   No medications on file   Modified Medications   No medications on file    Planned Follow Up No follow-ups on file.   Total Time in Face-to-Face and in Coordination of Care for patient including independent/personal interpretation/review of prior testing, medical history, examination, medication adjustment, communicating results with the patient directly, and documentation with the EHR is 30 minutes.   Justice Britain, MD [facility] Office # [phone]

## 2020-10-12 ENCOUNTER — Other Ambulatory Visit: Payer: Self-pay | Admitting: Medical Oncology

## 2020-10-12 ENCOUNTER — Ambulatory Visit (HOSPITAL_COMMUNITY)
Admission: RE | Admit: 2020-10-12 | Discharge: 2020-10-12 | Disposition: A | Discharge status: Home / Self Care | Payer: Medicare HMO | Source: Ambulatory Visit | Attending: Internal Medicine | Admitting: Internal Medicine

## 2020-10-12 ENCOUNTER — Other Ambulatory Visit: Payer: Self-pay

## 2020-10-12 ENCOUNTER — Inpatient Hospital Stay: Payer: Medicare HMO | Attending: Internal Medicine

## 2020-10-12 DIAGNOSIS — R911 Solitary pulmonary nodule: Secondary | ICD-10-CM | POA: Diagnosis not present

## 2020-10-12 DIAGNOSIS — I700 Atherosclerosis of aorta: Secondary | ICD-10-CM | POA: Diagnosis not present

## 2020-10-12 DIAGNOSIS — Z9221 Personal history of antineoplastic chemotherapy: Secondary | ICD-10-CM | POA: Diagnosis not present

## 2020-10-12 DIAGNOSIS — Z923 Personal history of irradiation: Secondary | ICD-10-CM | POA: Insufficient documentation

## 2020-10-12 DIAGNOSIS — C3432 Malignant neoplasm of lower lobe, left bronchus or lung: Secondary | ICD-10-CM | POA: Diagnosis not present

## 2020-10-12 DIAGNOSIS — C3490 Malignant neoplasm of unspecified part of unspecified bronchus or lung: Secondary | ICD-10-CM | POA: Insufficient documentation

## 2020-10-12 DIAGNOSIS — J479 Bronchiectasis, uncomplicated: Secondary | ICD-10-CM | POA: Diagnosis not present

## 2020-10-12 DIAGNOSIS — K769 Liver disease, unspecified: Secondary | ICD-10-CM | POA: Diagnosis not present

## 2020-10-12 DIAGNOSIS — C7951 Secondary malignant neoplasm of bone: Secondary | ICD-10-CM | POA: Insufficient documentation

## 2020-10-12 DIAGNOSIS — J90 Pleural effusion, not elsewhere classified: Secondary | ICD-10-CM | POA: Diagnosis not present

## 2020-10-12 DIAGNOSIS — N281 Cyst of kidney, acquired: Secondary | ICD-10-CM | POA: Diagnosis not present

## 2020-10-12 DIAGNOSIS — Z79899 Other long term (current) drug therapy: Secondary | ICD-10-CM | POA: Diagnosis not present

## 2020-10-12 DIAGNOSIS — R590 Localized enlarged lymph nodes: Secondary | ICD-10-CM | POA: Diagnosis not present

## 2020-10-12 DIAGNOSIS — E039 Hypothyroidism, unspecified: Secondary | ICD-10-CM

## 2020-10-12 DIAGNOSIS — K8020 Calculus of gallbladder without cholecystitis without obstruction: Secondary | ICD-10-CM | POA: Diagnosis not present

## 2020-10-12 DIAGNOSIS — J439 Emphysema, unspecified: Secondary | ICD-10-CM | POA: Diagnosis not present

## 2020-10-12 LAB — CBC WITH DIFFERENTIAL (CANCER CENTER ONLY)
Abs Immature Granulocytes: 0.02 10*3/uL (ref 0.00–0.07)
Basophils Absolute: 0 10*3/uL (ref 0.0–0.1)
Basophils Relative: 0 %
Eosinophils Absolute: 0.1 10*3/uL (ref 0.0–0.5)
Eosinophils Relative: 2 %
HCT: 42 % (ref 39.0–52.0)
Hemoglobin: 14 g/dL (ref 13.0–17.0)
Immature Granulocytes: 0 %
Lymphocytes Relative: 25 %
Lymphs Abs: 1.9 10*3/uL (ref 0.7–4.0)
MCH: 32.3 pg (ref 26.0–34.0)
MCHC: 33.3 g/dL (ref 30.0–36.0)
MCV: 96.8 fL (ref 80.0–100.0)
Monocytes Absolute: 0.6 10*3/uL (ref 0.1–1.0)
Monocytes Relative: 7 %
Neutro Abs: 5 10*3/uL (ref 1.7–7.7)
Neutrophils Relative %: 66 %
Platelet Count: 188 10*3/uL (ref 150–400)
RBC: 4.34 MIL/uL (ref 4.22–5.81)
RDW: 14.6 % (ref 11.5–15.5)
WBC Count: 7.6 10*3/uL (ref 4.0–10.5)
nRBC: 0 % (ref 0.0–0.2)

## 2020-10-12 LAB — CMP (CANCER CENTER ONLY)
ALT: 13 U/L (ref 0–44)
AST: 14 U/L — ABNORMAL LOW (ref 15–41)
Albumin: 3.5 g/dL (ref 3.5–5.0)
Alkaline Phosphatase: 100 U/L (ref 38–126)
Anion gap: 8 (ref 5–15)
BUN: 11 mg/dL (ref 8–23)
CO2: 28 mmol/L (ref 22–32)
Calcium: 9.4 mg/dL (ref 8.9–10.3)
Chloride: 104 mmol/L (ref 98–111)
Creatinine: 0.72 mg/dL (ref 0.61–1.24)
GFR, Estimated: >60 mL/min (ref >60)
Glucose, Bld: 131 mg/dL — ABNORMAL HIGH (ref 70–99)
Potassium: 4.3 mmol/L (ref 3.5–5.1)
Sodium: 140 mmol/L (ref 135–145)
Total Bilirubin: 0.6 mg/dL (ref 0.3–1.2)
Total Protein: 6.5 g/dL (ref 6.5–8.1)

## 2020-10-12 LAB — TSH: TSH: 0.668 u[IU]/mL (ref 0.320–4.118)

## 2020-10-12 MED ORDER — IOHEXOL 350 MG/ML SOLN
80.0000 mL | Freq: Once | INTRAVENOUS | Status: AC | PRN
  Administered 2020-10-12: 80 mL via INTRAVENOUS

## 2020-10-12 MED ORDER — IOHEXOL 9 MG/ML PO SOLN: 1000.0000 mL | ORAL | Status: DC

## 2020-10-12 MED ORDER — SODIUM CHLORIDE (PF) 0.9 % IJ SOLN
INTRAMUSCULAR | Status: AC
  Filled 2020-10-12: qty 50

## 2020-10-12 MED ORDER — IOHEXOL 9 MG/ML PO SOLN: 1000.0000 mL | ORAL | Status: AC

## 2020-10-12 MED ORDER — IOHEXOL 9 MG/ML PO SOLN
ORAL | Status: AC
  Administered 2020-10-12: 1000 mL
  Filled 2020-10-12: qty 1000

## 2020-10-13 ENCOUNTER — Other Ambulatory Visit: Payer: Self-pay

## 2020-10-13 ENCOUNTER — Inpatient Hospital Stay (HOSPITAL_BASED_OUTPATIENT_CLINIC_OR_DEPARTMENT_OTHER): Payer: Medicare HMO | Admitting: Internal Medicine

## 2020-10-13 ENCOUNTER — Other Ambulatory Visit: Payer: Medicare HMO

## 2020-10-13 VITALS — BP 104/70 | HR 98 | Temp 98.5°F | Resp 18 | Ht 73.0 in | Wt 233.9 lb

## 2020-10-13 DIAGNOSIS — C7951 Secondary malignant neoplasm of bone: Secondary | ICD-10-CM | POA: Diagnosis not present

## 2020-10-13 DIAGNOSIS — Z9221 Personal history of antineoplastic chemotherapy: Secondary | ICD-10-CM | POA: Diagnosis not present

## 2020-10-13 DIAGNOSIS — Z95828 Presence of other vascular implants and grafts: Secondary | ICD-10-CM | POA: Diagnosis not present

## 2020-10-13 DIAGNOSIS — C3432 Malignant neoplasm of lower lobe, left bronchus or lung: Secondary | ICD-10-CM | POA: Diagnosis not present

## 2020-10-13 DIAGNOSIS — C3490 Malignant neoplasm of unspecified part of unspecified bronchus or lung: Secondary | ICD-10-CM | POA: Diagnosis not present

## 2020-10-13 DIAGNOSIS — Z79899 Other long term (current) drug therapy: Secondary | ICD-10-CM | POA: Diagnosis not present

## 2020-10-13 DIAGNOSIS — Z923 Personal history of irradiation: Secondary | ICD-10-CM | POA: Diagnosis not present

## 2020-10-13 MED ORDER — SODIUM CHLORIDE 0.9% FLUSH
10.0000 mL | INTRAVENOUS | Status: DC | PRN
  Administered 2020-10-13: 10 mL
  Filled 2020-10-13: qty 10

## 2020-10-13 MED ORDER — HEPARIN SOD (PORK) LOCK FLUSH 100 UNIT/ML IV SOLN
500.0000 [IU] | Freq: Once | INTRAVENOUS | Status: AC | PRN
  Administered 2020-10-13: 500 [IU]
  Filled 2020-10-13: qty 5

## 2020-10-13 NOTE — Progress Notes (Signed)
[place] [phone]   Fax:([phone]  OFFICE PROGRESS NOTE  [name], MD [address] [place] [address]  DIAGNOSIS: Metastatic non-small cell lung cancer initially diagnosed as stage IIIA (T2a, N2, M0) non-small cell lung cancer, poorly differentiated adenocarcinoma presented with left lower lobe lung mass in addition to mediastinal lymphadenopathy.  The patient was diagnosed with metastatic disease involving the left femur as well as left supraclavicular nodal metastases and right paratracheal lymphadenopathy in October 2018.  Biomarker Findings Microsatellite Status - MS-Stable Tumor Mutational Burden - TMB-Low (3 Muts/Mb) Genomic Findings For a complete list of the genes assayed, please refer to the Appendix. STK11 P224f*6 [id number] site 1315+4M>GDAXX E374* MLL2 [id number] NBN K23310f NOTCH2 [id number] splice site [id number]+3O>IDisease relevant genes with no reportable alterations: EGFR, KRAS, ALK, BRAF, MET, RET, ERBB2, ROS1   PDL1 expression 5%  PRIOR THERAPY: 1) status post wedge resection of the left lower lobe lung mass as well as AP window lymph node dissection but there was residual metastatic mediastinal lymphadenopathy that could not be resected. 2) a course of concurrent chemoradiation [place] carbopl[place]  3) status post palliative radiotherapy to the left femur metastatic bone disease. 4)  Systemic chemotherapy with carboplatin for AUC of 5, Alimta 500 mg/M2 and Keytruda 200 mg IV every 3 weeks.  First dose March 07, 2017.  Carboplatin was discontinued during cycle #2 secondary to hypersensitivity reaction. 5) status post 2 cycles of maintenance treatment with Alimta and Ketruda (pembrolizumab).  Alimta was discontinued secondary to intolerance.  CURRENT THERAPY: Maintenance treatment with single agent Ketruda (pembrolizumab) status post 49  cycles.   His treatment has been on hold since January 2022 secondary to intolerance.  INTERVAL HISTORY: Victor Huber 36o. male returns to the clinic today for follow-up visit accompanied by his wife.  The patient is feeling very well today with no concerning complaints.  He continues to have mild dysphagia and scheduled for esophageal dilatation next months by Dr. ManRush LandmarkThe patient denied having any chest pain, shortness of breath, cough or hemoptysis.  He denied having any fever or chills.  He has no nausea, vomiting, diarrhea or constipation.  He has no headache or visual changes.  He had repeat CT scan of the chest, abdomen pelvis performed yesterday and he is here for evaluation and discussion of his scan results.  MEDICAL HISTORY: Past Medical History:  Diagnosis Date   Adenocarcinoma of left lung, stage 3 (Victor Huber/16/2018   Allergy    Anxiety    Arthritis    Atrial fibrillation (Victor Huber/16/2018   Atrial fibrillation (HCC)    Cancer, metastatic to bone (Victor Huber  Cataract    Waiting to schedule bilateral cataract surgery   COPD (chronic obstructive pulmonary disease) ([place]/16/2018   Depression    Dysphagia    Dysrhythmia    a-fib   GERD (gastroesophageal reflux disease)    Heart murmur    atenlol for A Fib/Eliquis   History of chemotherapy    History of radiation therapy    Hyperlipidemia    Hypothyroid 08/15/2016   Longstanding persistent atrial fibrillation (Victor Huber/30/2018   Pathologic fracture    left femur   Pneumonitis    S/P TURP 08/15/2016   Wears glasses    Wears hearing aid in both ears     ALLERGIES:  is allergic to carboplatin, flecainide, and debrox [  carbamide peroxide].  MEDICATIONS:  Current Outpatient Medications  Medication Sig Dispense Refill   acetaminophen (TYLENOL) 500 MG tablet Take 500 mg by mouth every 8 (eight) hours as needed for moderate pain.     albuterol (VENTOLIN HFA) 108 (90 Base) MCG/ACT inhaler INHALE 1 TO 2 PUFFS EVERY 4 HOURS AS  NEEDED FOR WHEEZING  OR FOR SHORTNESS OF BREATH 18 g 3   atenolol (TENORMIN) 50 MG tablet TAKE 1 TABLET EVERY DAY 90 tablet 3   atorvastatin (LIPITOR) 40 MG tablet TAKE 1 TABLET EVERY DAY 90 tablet 2   ELIQUIS 5 MG TABS tablet TAKE 1 TABLET TWICE DAILY 180 tablet 1   esomeprazole (NEXIUM) 40 MG capsule Take 1 capsule (40 mg total) by mouth 2 (two) times daily before a meal. 180 capsule 3   gabapentin (NEURONTIN) 300 MG capsule Take 2 capsules (600 mg total) by mouth at bedtime as needed. 180 capsule 3   ibuprofen (ADVIL,MOTRIN) 400 MG tablet Take 400 mg by mouth every 4 (four) hours as needed for moderate pain.     levothyroxine (SYNTHROID) 200 MCG tablet TAKE 1 TABLET ONE TIME DAILY BEFORE BREAKFAST 90 tablet 0   lipase/protease/amylase (CREON) 36000 UNITS CPEP capsule Take 4 capsules po during each meal. Take 1-2 capsules po during each snack.( up to 2 snacks daily). (Patient taking differently: Take 36,000-72,000 Units by mouth See admin instructions. Take 2 capsules po during each meal. Take 1 capsule po during each snack.( up to 2 snacks daily).) 480 capsule 3   Oxycodone HCl 10 MG TABS Take 1 tablet (10 mg total) by mouth daily as needed (pain). 30 tablet 0   sucralfate (CARAFATE) 1 g tablet TAKE 1 TABLET BY MOUTH 4 TIMES DAILY WITH MEALS AND AT BEDTIME 90 tablet 0   tamsulosin (FLOMAX) 0.4 MG CAPS capsule Take 1 capsule (0.4 mg total) by mouth daily. 90 capsule 4   venlafaxine XR (EFFEXOR-XR) 37.5 MG 24 hr capsule Take 37.5 mg by mouth 2 (two) times daily.     No current facility-administered medications for this visit.    SURGICAL HISTORY:  Past Surgical History:  Procedure Laterality Date   BIOPSY  12/21/2019   Procedure: BIOPSY;  Surgeon: Rush Landmark Telford Nab., MD;  Location: [place];  Service: Gastroenterology;;   BIOPSY  05/23/2020   Procedure: BIOPSY;  Surgeon: Irving Copas., MD;  Location: [facility];  Service: Gastroenterology;;   BRONCHOSCOPY  10/2014    CARDIAC CATHETERIZATION     05/07/12   CARDIOVERSION     x2   COLONOSCOPY     several yrs   DG BIOPSY LUNG Left 10/2014   FNA - Adenocarcinoma    ESOPHAGOGASTRODUODENOSCOPY (EGD) WITH PROPOFOL N/A 12/21/2019   Procedure: ESOPHAGOGASTRODUODENOSCOPY (EGD) WITH PROPOFOL;  Surgeon: Irving Copas., MD;  Location: [place];  Service: Gastroenterology;  Laterality: N/A;   ESOPHAGOGASTRODUODENOSCOPY (EGD) WITH PROPOFOL N/A 05/23/2020   Procedure: ESOPHAGOGASTRODUODENOSCOPY (EGD) WITH PROPOFOL;  Surgeon: Rush Landmark Telford Nab., MD;  Location: [facility];  Service: Gastroenterology;  Laterality: N/A;   ESOPHAGOGASTRODUODENOSCOPY (EGD) WITH PROPOFOL N/A 06/30/2020   Procedure: ESOPHAGOGASTRODUODENOSCOPY (EGD) WITH PROPOFOL;  Surgeon: Rush Landmark Telford Nab., MD;  Location: [place];  Service: Gastroenterology;  Laterality: N/A;  ultra slim scope avail   EUS N/A 12/21/2019   Procedure: UPPER ENDOSCOPIC ULTRASOUND (EUS) RADIAL;  Surgeon: Irving Copas., MD;  Location: [place];  Service: Gastroenterology;  Laterality: N/A;   FEMUR IM NAIL Left 02/19/2017   FEMUR IM NAIL Left 02/19/2017  Procedure: INTRAMEDULLARY (IM) NAIL FEMORAL;  Surgeon: Marchia Bond, MD;  Location: [place];  Service: Orthopedics;  Laterality: Left;   IR FLUORO GUIDE PORT INSERTION RIGHT  07/03/2017   IR US GUIDE VASC ACCESS RIGHT  07/03/2017   LUNG CANCER SURGERY Left 12/2014   Wedge Resection    MULTIPLE TOOTH EXTRACTIONS     SAVORY DILATION N/A 05/23/2020   Procedure: SAVORY DILATION;  Surgeon: Irving Copas., MD;  Location: [facility];  Service: Gastroenterology;  Laterality: N/A;   SAVORY DILATION N/A 06/30/2020   Procedure: SAVORY DILATION;  Surgeon: Rush Landmark Telford Nab., MD;  Location: [place];  Service: Gastroenterology;  Laterality: N/A;   Status post TURP     TONSILLECTOMY      REVIEW OF SYSTEMS:  Constitutional: positive for fatigue Eyes: negative Ears, nose, mouth, throat,  and face: negative Respiratory: negative Cardiovascular: negative Gastrointestinal: positive for dysphagia Genitourinary:negative Integument/breast: negative Hematologic/lymphatic: negative Musculoskeletal:negative Neurological: negative Behavioral/Psych: negative Endocrine: negative Allergic/Immunologic: negative   PHYSICAL EXAMINATION: General appearance: alert, cooperative, fatigued, and no distress Head: Normocephalic, without obvious abnormality, atraumatic Neck: no adenopathy, no JVD, supple, symmetrical, trachea midline, and thyroid not enlarged, symmetric, no tenderness/mass/nodules Lymph nodes: Cervical, supraclavicular, and axillary nodes normal. Resp: clear to auscultation bilaterally Back: symmetric, no curvature. ROM normal. No CVA tenderness. Cardio: regular rate and rhythm, S1, S2 normal, no murmur, click, rub or gallop GI: soft, non-tender; bowel sounds normal; no masses,  no organomegaly Extremities: extremities normal, atraumatic, no cyanosis or edema Neurologic: Alert and oriented X 3, normal strength and tone. Normal symmetric reflexes. Normal coordination and gait  ECOG PERFORMANCE STATUS: 1 - Symptomatic but completely ambulatory  Blood pressure 104/70, pulse 98, temperature 98.5 F (36.9 C), resp. rate 18, height 6' 1"  (1.854 m), weight 233 lb 14.4 oz (106.1 kg), SpO2 96 %.  LABORATORY DATA: Lab Results  Component Value Date   WBC 7.6 10/12/2020   HGB 14.0 10/12/2020   HCT 42.0 10/12/2020   MCV 96.8 10/12/2020   PLT 188 10/12/2020      Chemistry      Component Value Date/Time   NA 140 10/12/2020 1251   NA 136 04/04/2017 1141   K 4.3 10/12/2020 1251   K 4.4 04/04/2017 1141   CL 104 10/12/2020 1251   CO2 28 10/12/2020 1251   CO2 24 04/04/2017 1141   BUN 11 10/12/2020 1251   BUN 21.1 04/04/2017 1141   CREATININE 0.72 10/12/2020 1251   CREATININE 1.1 04/04/2017 1141      Component Value Date/Time   CALCIUM 9.4 10/12/2020 1251   CALCIUM 9.1  04/04/2017 1141   ALKPHOS 100 10/12/2020 1251   ALKPHOS 89 04/04/2017 1141   AST 14 (L) 10/12/2020 1251   AST 17 04/04/2017 1141   ALT 13 10/12/2020 1251   ALT 18 04/04/2017 1141   BILITOT 0.6 10/12/2020 1251   BILITOT 0.67 04/04/2017 1141       RADIOGRAPHIC STUDIES: DG Foot Complete Right  Result Date: 09/27/2020 CLINICAL DATA:  Right foot pain.  Follow-up fracture. EXAM: RIGHT FOOT COMPLETE - 3+ VIEW COMPARISON:  09/12/2020 FINDINGS: Fracture of the mid shaft of the first metatarsal. Comminuted fracture with mild displacement. Alignment unchanged. Progressive periosteal new bone formation around the fracture. No complication. No other fracture.  Degenerative change in spurring in the midfoot. IMPRESSION: Progressive healing of first metatarsal fracture. Mild displacement unchanged. Electronically Signed   By: Franchot Gallo M.D.   On: 09/27/2020 09:00     ASSESSMENT AND  PLAN: This is a 70 years old white male with metastatic non-small cell lung cancer, adenocarcinoma with no actionable mutations and PDL 1 expression of 5% that was initially diagnosed as stage IIIa non-small cell lung cancer, adenocarcinoma status post left lower lobectomy with lymph node dissection followed by a course of concurrent chemoradiation completed in January 2016.  The patient had evidence for disease metastasis in October 2018 with metastatic disease to the left femur as well as left supraclavicular and right paratracheal lymph nodes. The patient is currently on systemic chemotherapy initially was with carboplatin, Alimta and Keytruda.  Carboplatin was discontinued secondary to hypersensitivity reaction starting from cycle #2.   He was also treated with 3 cycles of maintenance Alimta and Ketruda (pembrolizumab) but Alimta was discontinued secondary to intolerance. He is currently undergoing treatment with maintenance Keytruda as a single agent status post 49 cycles.  The patient has been tolerating his treatment  fairly well with no concerning complaints but the patient continues to have a lot of issues with his arthritis especially in the knees and ankles.  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 His last scan showed no concerning findings for disease progression but there was new 8 mm nodule in the left lung that need close monitoring on the upcoming imaging studies.  The patient has been on observation since January 2022. The patient has been doing well for the last 7 months or so with no concerning complaints.  He actually felt much better after discontinuing his treatment with Keytruda. He had repeat CT scan of the chest, abdomen pelvis performed yesterday.  Unfortunately the final report of the scan is not available but I personally reviewed the scan and I did not see any clear significant abnormalities regarding disease progression but definitely will wait for the final report for confirmation. I recommended for the patient to continue on observation with repeat CT scan of the chest, abdomen pelvis in 3 months. I will arrange for the patient to have Port-A-Cath flush every 6 weeks starting today. For the hypothyroidism, he will continue his routine follow-up visit in management by his primary care physician. He was advised to call immediately if he has any other concerning symptoms in the interval. The patient voices understanding of current disease status and treatment options and is in agreement with the current care plan. All questions were answered. The patient knows to call the clinic with any problems, questions or concerns. We can certainly see the patient much sooner if necessary.  Disclaimer: This note was dictated with voice recognition software. Similar sounding words can inadvertently be transcribed and may not be corrected upon review.

## 2020-10-13 NOTE — Addendum Note (Signed)
Addended by: Ardeen Garland on: 10/13/2020 10:38 AM   Modules accepted: Orders

## 2020-10-24 ENCOUNTER — Other Ambulatory Visit: Payer: Self-pay | Admitting: Family Medicine

## 2020-10-24 DIAGNOSIS — E785 Hyperlipidemia, unspecified: Secondary | ICD-10-CM

## 2020-10-27 ENCOUNTER — Other Ambulatory Visit: Payer: Self-pay

## 2020-10-27 ENCOUNTER — Encounter: Payer: Self-pay | Admitting: Family Medicine

## 2020-10-27 DIAGNOSIS — E785 Hyperlipidemia, unspecified: Secondary | ICD-10-CM

## 2020-10-27 MED ORDER — ATORVASTATIN CALCIUM 40 MG PO TABS: 40.0000 mg | ORAL_TABLET | Freq: Every day | ORAL | 0 refills | Status: DC

## 2020-10-27 NOTE — Telephone Encounter (Signed)
Ok to send refills? Please advise

## 2020-10-28 ENCOUNTER — Other Ambulatory Visit: Payer: Self-pay

## 2020-10-28 ENCOUNTER — Ambulatory Visit (HOSPITAL_COMMUNITY)
Admission: RE | Admit: 2020-10-28 | Discharge: 2020-10-28 | Disposition: A | Discharge status: Home / Self Care | Payer: Medicare HMO | Source: Ambulatory Visit | Attending: Gastroenterology | Admitting: Gastroenterology

## 2020-10-28 DIAGNOSIS — K222 Esophageal obstruction: Secondary | ICD-10-CM | POA: Diagnosis not present

## 2020-10-28 DIAGNOSIS — K224 Dyskinesia of esophagus: Secondary | ICD-10-CM | POA: Diagnosis not present

## 2020-10-28 DIAGNOSIS — K209 Esophagitis, unspecified without bleeding: Secondary | ICD-10-CM | POA: Diagnosis not present

## 2020-10-28 DIAGNOSIS — K2091 Esophagitis, unspecified with bleeding: Secondary | ICD-10-CM | POA: Diagnosis not present

## 2020-11-02 ENCOUNTER — Other Ambulatory Visit: Payer: Self-pay

## 2020-11-02 ENCOUNTER — Encounter: Payer: Self-pay | Admitting: Gastroenterology

## 2020-11-02 ENCOUNTER — Ambulatory Visit (AMBULATORY_SURGERY_CENTER): Payer: Medicare HMO | Admitting: Gastroenterology

## 2020-11-02 VITALS — BP 126/53 | HR 78 | Temp 98.0°F | Resp 20 | Ht 73.0 in | Wt 233.0 lb

## 2020-11-02 DIAGNOSIS — R1310 Dysphagia, unspecified: Secondary | ICD-10-CM

## 2020-11-02 DIAGNOSIS — K209 Esophagitis, unspecified without bleeding: Secondary | ICD-10-CM

## 2020-11-02 DIAGNOSIS — K449 Diaphragmatic hernia without obstruction or gangrene: Secondary | ICD-10-CM | POA: Diagnosis not present

## 2020-11-02 DIAGNOSIS — K222 Esophageal obstruction: Secondary | ICD-10-CM | POA: Diagnosis not present

## 2020-11-02 MED ORDER — TRIAMCINOLONE ACETONIDE 40 MG/ML IJ SUSP
200.0000 mg | Freq: Once | INTRAMUSCULAR | Status: AC
  Administered 2020-11-02: 200 mg via INTRA_ARTICULAR

## 2020-11-02 MED ORDER — SODIUM CHLORIDE 0.9 % IV SOLN: 500.0000 mL | Freq: Once | INTRAVENOUS | Status: DC

## 2020-11-02 NOTE — Progress Notes (Signed)
pt tolerated well. VSS. awake and to recovery. Report given to RN.  

## 2020-11-02 NOTE — Progress Notes (Signed)
VS- Victor Huber  Pt [facility]  Per Dr. Rush Landmark- ok to hold Eliquis 2 days prior to procedure for 11-22-20

## 2020-11-02 NOTE — Patient Instructions (Addendum)
Handouts given for Post-dilation diet, esophagitis and stricture, gastritis and hiatal hernia.  Use Cepacol or Halls lozenges for the next 72-96 hrs for sore throat.  Restart Eliquis in 48 hrs.  Continue present medications.  YOU HAD AN ENDOSCOPIC PROCEDURE TODAY AT [facility]:   Refer to the procedure report that was given to you for any specific questions about what was found during the examination.  If the procedure report does not answer your questions, please call your gastroenterologist to clarify.  If you requested that your care partner not be given the details of your procedure findings, then the procedure report has been included in a sealed envelope for you to review at your convenience later.  YOU SHOULD EXPECT: Some feelings of bloating in the abdomen. Passage of more gas than usual.  Walking can help get rid of the air that was put into your GI tract during the procedure and reduce the bloating. If you had a lower endoscopy (such as a colonoscopy or flexible sigmoidoscopy) you may notice spotting of blood in your stool or on the toilet paper. If you underwent a bowel prep for your procedure, you may not have a normal bowel movement for a few days.  Please Note:  You might notice some irritation and congestion in your nose or some drainage.  This is from the oxygen used during your procedure.  There is no need for concern and it should clear up in a day or so.  SYMPTOMS TO REPORT IMMEDIATELY:  Following upper endoscopy (EGD)  Vomiting of blood or coffee ground material  New chest pain or pain under the shoulder blades  Painful or persistently difficult swallowing  New shortness of breath  Fever of 100F or higher  Black, tarry-looking stools  For urgent or emergent issues, a gastroenterologist can be reached at any hour by calling [phone]. Do not use MyChart messaging for urgent concerns.    DIET:  follow the post-dilation diet given to  you.  ACTIVITY:  You should plan to take it easy for the rest of today and you should NOT DRIVE or use heavy machinery until tomorrow (because of the sedation medicines used during the test).    FOLLOW UP: Our staff will call the number listed on your records 48-72 hours following your procedure to check on you and address any questions or concerns that you may have regarding the information given to you following your procedure. If we do not reach you, we will leave a message.  We will attempt to reach you two times.  During this call, we will ask if you have developed any symptoms of COVID 19. If you develop any symptoms (ie: fever, flu-like symptoms, shortness of breath, cough etc.) before then, please call [phone].  If you test positive for Covid 19 in the 2 weeks post procedure, please call and report this information to [place].    If any biopsies were taken you will be contacted by phone or by letter within the next 1-3 weeks.  Please call us at [phone] if you have not heard about the biopsies in 3 weeks.    SIGNATURES/CONFIDENTIALITY: You and/or your care partner have signed paperwork which will be entered into your electronic medical record.  These signatures attest to the fact that that the information above on your After Visit Summary has been reviewed and is understood.  Full responsibility of the confidentiality of this discharge information lies with you and/or your care-partner.

## 2020-11-02 NOTE — Op Note (Signed)
[place] Patient Name: Victor Huber Procedure Date: 11/02/2020 3:21 PM MRN: [id number] Endoscopist: Justice Britain , MD Age: 70 Referring MD:  Date of Birth: 1951-02-08 Gender: Male Account #: [id number] Procedure:                Upper GI endoscopy Indications:              Dysphagia, Abnormal UGI series, Esophagitis,                            Follow-up of esophagitis Medicines:                Monitored Anesthesia Care Procedure:                Pre-Anesthesia Assessment:                           - Prior to the procedure, a History and Physical                            was performed, and patient medications and                            allergies were reviewed. The patient's tolerance of                            previous anesthesia was also reviewed. The risks                            and benefits of the procedure and the sedation                            options and risks were discussed with the patient.                            All questions were answered, and informed consent                            was obtained. Prior Anticoagulants: The patient has                            taken Eliquis (apixaban), last dose was 3 days                            prior to procedure. ASA Grade Assessment: III - A                            patient with severe systemic disease. After                            reviewing the risks and benefits, the patient was                            deemed in satisfactory condition to undergo the  procedure.                           After obtaining informed consent, the endoscope was                            passed under direct vision. Throughout the                            procedure, the patient's blood pressure, pulse, and                            oxygen saturations were monitored continuously. The                            Endoscope was introduced through the mouth, and                             advanced to the second part of duodenum. The upper                            GI endoscopy was accomplished without difficulty.                            The patient tolerated the procedure. Scope In: Scope Out: Findings:                 No gross lesions were noted in the proximal                            esophagus.                           LA Grade C (one or more mucosal breaks continuous                            between tops of 2 or more mucosal folds, less than                            75% circumference) esophagitis with no bleeding was                            found 25 to 28 cm from the incisors this is where                            the noted stenosis below is found.                           One benign-appearing, intrinsic moderate                            (circumferential scarring or stenosis; an adult                            endoscope may pass with gentle pressure  to begin                            dilation) stenosis was found 25 to 28 cm from the                            incisors. This stenosis measured 1 cm (inner                            diameter) x 3-4 cm (in length). The stenosis was                            traversed. After the rest of the EGD was complete,                            a guidewire was placed and the scope was withdrawn.                            Dilation was performed with a Savary dilator with                            no resistance at 12 mm and 13 mm, mild resistance                            at 13 mm and 14 mm and moderate resistance at 15 mm                            and 16 mm. The dilation site was examined following                            endoscope reinsertion after every 2 dilations and                            on last dilation showed moderate mucosal                            disruption, moderate improvement in luminal                            narrowing and no perforation. There was evidence of                             bleeding as would be expected. Area was                            successfully injected with 3 mL of triamcinolone                            (10 mg/mL) for drug delivery.                           No gross lesions were noted in the distal esophagus.                             The Z-line was irregular and was found 39 cm from                            the incisors.                           A 3 cm hiatal hernia was present.                           Patchy mildly erythematous mucosa without bleeding                            was found in the gastric antrum - previously                            biopsied.                           No other gross lesions were noted in the entire                            examined stomach.                           No gross lesions were noted in the duodenal bulb,                            in the first portion of the duodenum and in the                            second portion of the duodenum. Complications:            No immediate complications. Estimated Blood Loss:     Estimated blood loss was minimal. Impression:               - No gross lesions in esophagus proximally. LA                            Grade C erosive esophagitis with no bleeding                            leading to esophageal stenosis was found from 25-28                            cm. Adult endoscope caused a mild dilation. Then                            dilated further as per notation above. Injected                            with steroids post dilation in effort of decreasing                            recurrence.                           -  No gross lesions in esophagus distally.                           - Z-line irregular, 39 cm from the incisors.                           - 3 cm hiatal hernia.                           - Erythematous mucosa in the antrum. No other gross                            lesions in the stomach. Has been biopsied                             previously.                           - No gross lesions in the duodenal bulb, in the                            first portion of the duodenum and in the second                            portion of the duodenum. Recommendation:           - The patient will be observed post-procedure,                            until all discharge criteria are met.                           - Discharge patient to home.                           - Patient has a contact number available for                            emergencies. The signs and symptoms of potential                            delayed complications were discussed with the                            patient. Return to normal activities tomorrow.                            Written discharge instructions were provided to the                            patient.                           - Dilation diet as per protocol. Liquids today and                              Soft diet tomorrow if doing well.                           - Please use Cepacol or Halls Lozenges +/-                            Chloraseptic spray for next 72-96 hours to aid in                            sore thoat should you experience this.                           - Restart Eliquis in 48 hours to decrease risk of                            post-interventional bleeding (5/22).                           - Continue present medications. Carafate twice                            daily. PPI twice daily.                           - Await pathology results and treat Candida if                            positive.                           - Recommend repeat EGD with dilation and possible                            repeat Triamcinolone therapy in 2-3 weeks in effort                            of decreasing recurrence. Things overall stable(now                            only 3-4 cm in length).                           - The findings and recommendations were discussed                             with the patient.                           - The findings and recommendations were discussed                            with the patient's family. Justice Britain, MD 11/02/2020 4:03:43 PM

## 2020-11-02 NOTE — Progress Notes (Signed)
Called to room to assist during endoscopic procedure.  Patient ID and intended procedure confirmed with present staff. Received instructions for my participation in the procedure from the performing physician.   Injected 29ml of Kenalog to esophageal stricture per Dr. Donneta Romberg orders and instructions.

## 2020-11-04 ENCOUNTER — Telehealth: Payer: Self-pay

## 2020-11-04 NOTE — Telephone Encounter (Signed)
  Follow up Call-  Call back number 11/02/2020 08/19/2020 03/11/2020  Post procedure Call Back phone  # [phone]  Permission to leave phone message Yes Yes Yes  Some recent data might be hidden     Patient questions:  Do you have a fever, pain , or abdominal swelling? No. Pain Score  0 *  Have you tolerated food without any problems? Yes.    Have you been able to return to your normal activities? Yes.    Do you have any questions about your discharge instructions: Diet   No. Medications  No. Follow up visit  No.  Do you have questions or concerns about your Care? No.  Actions: * If pain score is 4 or above: No action needed, pain <4.  Have you developed a fever since your procedure? No   2.   Have you had an respiratory symptoms (SOB or cough) since your procedure? No   3.   Have you tested positive for COVID 19 since your procedure no   4.   Have you had any family members/close contacts diagnosed with the COVID 19 since your procedure?  No    If yes to any of these questions please route to Joylene John, RN and Joella Prince, RN

## 2020-11-11 ENCOUNTER — Telehealth: Payer: Self-pay | Admitting: Lab

## 2020-11-11 NOTE — Chronic Care Management (AMB) (Signed)
  Chronic Care Management   Note  11/11/2020 Name: Victor Huber MRN: [id number] DOB: 1950-06-21  Victor Huber is a 70 y.o. year old male who is a primary care patient of [name], MD. I reached out to [name] by phone today in response to a referral sent by Victor Huber's PCP, [name], MD.   Victor Huber was given information about Chronic Care Management services today including:  CCM service includes personalized support from designated clinical staff supervised by his physician, including individualized plan of care and coordination with other care providers 24/7 contact phone numbers for assistance for urgent and routine care needs. Service will only be billed when office clinical staff spend 20 minutes or more in a month to coordinate care. Only one practitioner may furnish and bill the service in a calendar month. The patient may stop CCM services at any time (effective at the end of the month) by phone call to the office staff.   Victor Huber,Victor Huber verbally agreed to assistance and services provided by embedded care coordination/care management team today.  Follow up plan:   [place]

## 2020-11-18 ENCOUNTER — Telehealth: Payer: Self-pay | Admitting: Internal Medicine

## 2020-11-18 NOTE — Telephone Encounter (Signed)
Cancelled appts per 8/19 sch msg. Pt's wife is requested to cancel all appts except for the October appts.

## 2020-11-21 ENCOUNTER — Encounter: Payer: Self-pay | Admitting: Certified Registered Nurse Anesthetist

## 2020-11-22 ENCOUNTER — Encounter: Payer: Self-pay | Admitting: Gastroenterology

## 2020-11-22 ENCOUNTER — Other Ambulatory Visit: Payer: Self-pay

## 2020-11-22 ENCOUNTER — Ambulatory Visit (AMBULATORY_SURGERY_CENTER): Payer: Medicare HMO | Admitting: Gastroenterology

## 2020-11-22 VITALS — BP 119/75 | HR 84 | Temp 97.3°F | Resp 20 | Ht 73.0 in | Wt 204.0 lb

## 2020-11-22 DIAGNOSIS — K222 Esophageal obstruction: Secondary | ICD-10-CM

## 2020-11-22 DIAGNOSIS — K219 Gastro-esophageal reflux disease without esophagitis: Secondary | ICD-10-CM | POA: Diagnosis not present

## 2020-11-22 DIAGNOSIS — J449 Chronic obstructive pulmonary disease, unspecified: Secondary | ICD-10-CM | POA: Diagnosis not present

## 2020-11-22 DIAGNOSIS — I4891 Unspecified atrial fibrillation: Secondary | ICD-10-CM | POA: Diagnosis not present

## 2020-11-22 DIAGNOSIS — K209 Esophagitis, unspecified without bleeding: Secondary | ICD-10-CM

## 2020-11-22 MED ORDER — SODIUM CHLORIDE 0.9 % IV SOLN: 500.0000 mL | Freq: Once | INTRAVENOUS | Status: DC

## 2020-11-22 NOTE — Progress Notes (Signed)
0814 HR > 100 with esmolol 25 mg given IV, MD updated, vss Pt has history of chronic a -fib.

## 2020-11-22 NOTE — Progress Notes (Signed)
GASTROENTEROLOGY PROCEDURE H&P NOTE   Primary Care Physician: [name], MD  HPI: Victor Huber is a 70 y.o. male who presents for EGD with repeat dilation of esophageal stricture.  Past Medical History:  Diagnosis Date   Adenocarcinoma of left lung, stage 3 ([place]) 08/15/2016   Allergy    Anxiety    Arthritis    Atrial fibrillation ([place]) 08/15/2016   Atrial fibrillation ([facility])    Cancer, metastatic to bone [facility])    lung   Cataract    Waiting to schedule bilateral cataract surgery   COPD (chronic obstructive pulmonary disease) ([place]) 08/15/2016   Depression    Dysphagia    Dysrhythmia    a-fib   GERD (gastroesophageal reflux disease)    Heart murmur    atenlol for A Fib/Eliquis   History of chemotherapy    History of radiation therapy    Hyperlipidemia    Hypertension    Hypothyroid 08/15/2016   Longstanding persistent atrial fibrillation ([place]) 08/29/2016   Pathologic fracture    left femur   Pneumonitis    S/P TURP 08/15/2016   Wears glasses    Wears hearing aid in both ears    Past Surgical History:  Procedure Laterality Date   BIOPSY  12/21/2019   Procedure: BIOPSY;  Surgeon: Irving Copas., MD;  Location: [place];  Service: Gastroenterology;;   BIOPSY  05/23/2020   Procedure: BIOPSY;  Surgeon: Irving Copas., MD;  Location: [facility];  Service: Gastroenterology;;   BRONCHOSCOPY  10/2014   CARDIAC CATHETERIZATION     05/07/12   CARDIOVERSION     x2   COLONOSCOPY     several yrs   DG BIOPSY LUNG Left 10/2014   FNA - Adenocarcinoma    ESOPHAGOGASTRODUODENOSCOPY (EGD) WITH PROPOFOL N/A 12/21/2019   Procedure: ESOPHAGOGASTRODUODENOSCOPY (EGD) WITH PROPOFOL;  Surgeon: Irving Copas., MD;  [facility] END[facility]COPY;  Service: Gastroenterology;  Laterality: N/A;   ESOPHAGOGASTRODUODENOSCOPY (EGD) WITH PROPOFOL N/A 05/23/2020   Procedure: ESOPHAGOGASTRODUODENOSCOPY (EGD) WITH PROPOFOL;  Surgeon: Rush Landmark Telford Nab., MD;   Location: [facility] ENDOSCOPY;  Service: Gastroenterology;  Laterality: N/A;   ESOPHAGOGASTRODUODENOSCOPY (EGD) WITH PROPOFOL N/A 06/30/2020   Procedure: ESOPHAGOGASTRODUODENOSCOPY (EGD) WITH PROPOFOL;  Surgeon: Rush Landmark Telford Nab., MD;  Location: [place];  Service: Gastroenterology;  Laterality: N/A;  ultra slim scope avail   EUS N/A 12/21/2019   Procedure: UPPER ENDOSCOPIC ULTRASOUND (EUS) RADIAL;  Surgeon: Irving Copas., MD;  Location: [place];  Service: Gastroenterology;  Laterality: N/A;   FEMUR IM NAIL Left 02/19/2017   FEMUR IM NAIL Left 02/19/2017   Procedure: INTRAMEDULLARY (IM) NAIL FEMORAL;  Surgeon: Marchia Bond, MD;  Location: [place];  Service: Orthopedics;  Laterality: Left;   IR FLUORO GUIDE PORT INSERTION RIGHT  07/03/2017   IR US GUIDE VASC ACCESS RIGHT  07/03/2017   LUNG CANCER SURGERY Left 12/2014   Wedge Resection    MULTIPLE TOOTH EXTRACTIONS     SAVORY DILATION N/A 05/23/2020   Procedure: SAVORY DILATION;  Surgeon: Irving Copas., MD;  Location: [facility];  Service: Gastroenterology;  Laterality: N/A;   SAVORY DILATION N/A 06/30/2020   Procedure: SAVORY DILATION;  Surgeon: Rush Landmark Telford Nab., MD;  Location: [place];  Service: Gastroenterology;  Laterality: N/A;   Status post TURP     TONSILLECTOMY     Current Outpatient Medications  Medication Sig Dispense Refill   acetaminophen (TYLENOL) 500 MG tablet Take 500 mg by mouth every 8 (eight) hours as needed for  moderate pain.     albuterol (VENTOLIN HFA) 108 (90 Base) MCG/ACT inhaler INHALE 1 TO 2 PUFFS EVERY 4 HOURS AS NEEDED FOR WHEEZING  OR FOR SHORTNESS OF BREATH 18 g 3   atenolol (TENORMIN) 50 MG tablet TAKE 1 TABLET EVERY DAY 90 tablet 3   atorvastatin (LIPITOR) 40 MG tablet Take 1 tablet (40 mg total) by mouth daily. 90 tablet 0   ELIQUIS 5 MG TABS tablet TAKE 1 TABLET TWICE DAILY 180 tablet 1   esomeprazole (NEXIUM) 40 MG capsule Take 1 capsule (40 mg total) by mouth 2 (two) times  daily before a meal. 180 capsule 3   gabapentin (NEURONTIN) 300 MG capsule Take 2 capsules (600 mg total) by mouth at bedtime as needed. 180 capsule 3   ibuprofen (ADVIL,MOTRIN) 400 MG tablet Take 400 mg by mouth every 4 (four) hours as needed for moderate pain.     levothyroxine (SYNTHROID) 200 MCG tablet TAKE 1 TABLET ONE TIME DAILY BEFORE BREAKFAST 90 tablet 0   lipase/protease/amylase (CREON) 36000 UNITS CPEP capsule Take 4 capsules po during each meal. Take 1-2 capsules po during each snack.( up to 2 snacks daily). (Patient taking differently: Take 36,000-72,000 Units by mouth See admin instructions. Take 2 capsules po during each meal. Take 1 capsule po during each snack.( up to 2 snacks daily).) 480 capsule 3   Oxycodone HCl 10 MG TABS Take 1 tablet (10 mg total) by mouth daily as needed (pain). 30 tablet 0   sucralfate (CARAFATE) 1 g tablet TAKE 1 TABLET BY MOUTH 4 TIMES DAILY WITH MEALS AND AT BEDTIME 90 tablet 0   tamsulosin (FLOMAX) 0.4 MG CAPS capsule Take 1 capsule (0.4 mg total) by mouth daily. 90 capsule 4   venlafaxine XR (EFFEXOR-XR) 37.5 MG 24 hr capsule Take 37.5 mg by mouth 2 (two) times daily.     Current Facility-Administered Medications  Medication Dose Route Frequency Provider Last Rate Last Admin   0.9 %  sodium chloride infusion  500 mL Intravenous Once [name], Telford Nab., MD       Facility-Administered Medications Ordered in Other Visits  Medication Dose Route Frequency Provider Last Rate Last Admin   sodium chloride flush (NS) 0.9 % injection 10 mL  10 mL Intracatheter PRN Curt Bears, MD   10 mL at 10/13/20 1028    Current Outpatient Medications:    acetaminophen (TYLENOL) 500 MG tablet, Take 500 mg by mouth every 8 (eight) hours as needed for moderate pain., Disp: , Rfl:    albuterol (VENTOLIN HFA) 108 (90 Base) MCG/ACT inhaler, INHALE 1 TO 2 PUFFS EVERY 4 HOURS AS NEEDED FOR WHEEZING  OR FOR SHORTNESS OF BREATH, Disp: 18 g, Rfl: 3   atenolol (TENORMIN)  50 MG tablet, TAKE 1 TABLET EVERY DAY, Disp: 90 tablet, Rfl: 3   atorvastatin (LIPITOR) 40 MG tablet, Take 1 tablet (40 mg total) by mouth daily., Disp: 90 tablet, Rfl: 0   ELIQUIS 5 MG TABS tablet, TAKE 1 TABLET TWICE DAILY, Disp: 180 tablet, Rfl: 1   esomeprazole (NEXIUM) 40 MG capsule, Take 1 capsule (40 mg total) by mouth 2 (two) times daily before a meal., Disp: 180 capsule, Rfl: 3   gabapentin (NEURONTIN) 300 MG capsule, Take 2 capsules (600 mg total) by mouth at bedtime as needed., Disp: 180 capsule, Rfl: 3   ibuprofen (ADVIL,MOTRIN) 400 MG tablet, Take 400 mg by mouth every 4 (four) hours as needed for moderate pain., Disp: , Rfl:    levothyroxine (SYNTHROID)  200 MCG tablet, TAKE 1 TABLET ONE TIME DAILY BEFORE BREAKFAST, Disp: 90 tablet, Rfl: 0   lipase/protease/amylase (CREON) 36000 UNITS CPEP capsule, Take 4 capsules po during each meal. Take 1-2 capsules po during each snack.( up to 2 snacks daily). (Patient taking differently: Take 36,000-72,000 Units by mouth See admin instructions. Take 2 capsules po during each meal. Take 1 capsule po during each snack.( up to 2 snacks daily).), Disp: 480 capsule, Rfl: 3   Oxycodone HCl 10 MG TABS, Take 1 tablet (10 mg total) by mouth daily as needed (pain)., Disp: 30 tablet, Rfl: 0   sucralfate (CARAFATE) 1 g tablet, TAKE 1 TABLET BY MOUTH 4 TIMES DAILY WITH MEALS AND AT BEDTIME, Disp: 90 tablet, Rfl: 0   tamsulosin (FLOMAX) 0.4 MG CAPS capsule, Take 1 capsule (0.4 mg total) by mouth daily., Disp: 90 capsule, Rfl: 4   venlafaxine XR (EFFEXOR-XR) 37.5 MG 24 hr capsule, Take 37.5 mg by mouth 2 (two) times daily., Disp: , Rfl:   Current Facility-Administered Medications:    0.9 %  sodium chloride infusion, 500 mL, Intravenous, Once, [name], Telford Nab., MD  Facility-Administered Medications Ordered in Other Visits:    sodium chloride flush (NS) 0.9 % injection 10 mL, 10 mL, Intracatheter, PRN, Curt Bears, MD, 10 mL at 10/13/20 1028 Allergies   Allergen Reactions   Carboplatin Itching, Nausea And Vomiting and Other (See Comments)    Flushing   Flecainide Hypertension    CAUSED HEART ISSUES    Debrox [Carbamide Peroxide]     Swelling in ear canal.    Family History  Problem Relation Age of Onset   Breast cancer Mother    Breast cancer Maternal Aunt    Breast cancer Maternal Aunt    Lung disease Neg Hx    Colon cancer Neg Hx    Stomach cancer Neg Hx    Pancreatic cancer Neg Hx    Rectal cancer Neg Hx    Colon polyps Neg Hx    Esophageal cancer Neg Hx    Social History   Socioeconomic History   Marital status: Married    Spouse name: Not on file   Number of children: Not on file   Years of education: Not on file   Highest education level: Not on file  Occupational History    Comment: retired   Tobacco Use   Smoking status: Former    Packs/day: 1.00    Years: 50.00    Pack years: 50.00    Types: Cigarettes    Quit date: 08/15/2012    Years since quitting: 8.2   Smokeless tobacco: Never  Vaping Use   Vaping Use: Never used  Substance and Sexual Activity   Alcohol use: No   Drug use: No   Sexual activity: Not on file  Other Topics Concern   Not on file  Social History Narrative   [facility] (09/26/16):   Originally from [place]. Moved to [place] February 2018. Always lived in [place]. Moved to be closer to children & grandchildren. No international travel. 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 No mold exposure. No bird exposure or hot tub exposure. Enjoys reading. Previously enjoyed wood working with domestic woods.    Social Determinants of Health   Financial Resource Strain: Low Risk    Difficulty of Paying Living Expenses: Not hard at all  Food Insecurity: No Food Insecurity   Worried About Charity fundraiser in  the Last Year: Never true   Ran Out of Food in the Last Year: Never true  Transportation Needs: No Transportation  Needs   Lack of Transportation (Medical): No   Lack of Transportation (Non-Medical): No  Physical Activity: Inactive   Days of Exercise per Week: 0 days   Minutes of Exercise per Session: 0 min  Stress: No Stress Concern Present   Feeling of Stress : Not at all  Social Connections: Moderately Isolated   Frequency of Communication with Friends and Family: Twice a week   Frequency of Social Gatherings with Friends and Family: Twice a week   Attends Religious Services: Never   Marine scientist or Organizations: No   Attends Music therapist: Never   Marital Status: Married  Human resources officer Violence: Not At Risk   Fear of Current or Ex-Partner: No   Emotionally Abused: No   Physically Abused: No   Sexually Abused: No    Physical Exam: Vital signs in last 24 hours: @VSRANGES @   GEN: NAD EYE: Sclerae anicteric ENT: MMM CV: Non-tachycardic GI: Soft, NT/ND NEURO:  Alert & Oriented x 3  Lab Results: No results for input(s): WBC, HGB, HCT, PLT in the last 72 hours. BMET No results for input(s): NA, K, CL, CO2, GLUCOSE, BUN, CREATININE, CALCIUM in the last 72 hours. LFT No results for input(s): PROT, ALBUMIN, AST, ALT, ALKPHOS, BILITOT, BILIDIR, IBILI in the last 72 hours. PT/INR No results for input(s): LABPROT, INR in the last 72 hours.   Impression / Plan: This is a 70 y.o.male who presents for EGD with repeat dilation of esophageal stricture.  The risks and benefits of endoscopic evaluation/treatment were discussed with the patient and/or family; these include but are not limited to the risk of perforation, infection, bleeding, missed lesions, lack of diagnosis, severe illness requiring hospitalization, as well as anesthesia and sedation related illnesses.  The patient's history has been reviewed, patient examined, no change in status, and deemed stable for procedure.  The patient and/or family is agreeable to proceed.    Justice Britain, MD [place]  [facility]astro[facility] Advanced Endoscopy Office # [phone]

## 2020-11-22 NOTE — Progress Notes (Signed)
0818 HR > 100 with esmolol 25 mg given IV, MD updated, vss

## 2020-11-22 NOTE — Op Note (Addendum)
[facility] Patient Name: Victor Huber Procedure Date: 11/22/2020 7:38 AM MRN: [id number] Endoscopist: Justice Britain , MD Age: 70 Referring MD:  Date of Birth: 11/06/1950 Gender: Male Account #: [id number] Procedure:                Upper GI endoscopy Indications:              Stricture of the esophagus, For therapy of                            esophageal stricture Medicines:                Monitored Anesthesia Care Procedure:                Pre-Anesthesia Assessment:                           - Prior to the procedure, a History and Physical                            was performed, and patient medications and                            allergies were reviewed. The patient's tolerance of                            previous anesthesia was also reviewed. The risks                            and benefits of the procedure and the sedation                            options and risks were discussed with the patient.                            All questions were answered, and informed consent                            was obtained. Prior Anticoagulants: The patient has                            taken Eliquis (apixaban), last dose was 3 days                            prior to procedure. ASA Grade Assessment: III - A                            patient with severe systemic disease. After                            reviewing the risks and benefits, the patient was                            deemed in satisfactory condition to undergo the  procedure.                           After obtaining informed consent, the endoscope was                            passed under direct vision. Throughout the                            procedure, the patient's blood pressure, pulse, and                            oxygen saturations were monitored continuously. The                            Endoscope was introduced through the mouth, and                             advanced to the second part of duodenum. The upper                            GI endoscopy was accomplished without difficulty.                            The patient tolerated the procedure. Scope In: Scope Out: Findings:                 Normal mucosa was found in the proximal esophagus.                           LA Grade C (one or more mucosal breaks continuous                            between tops of 2 or more mucosal folds, less than                            75% circumference) esophagitis with no bleeding was                            found 25 to 28 cm from the incisors.                           One benign-appearing, intrinsic moderate                            (circumferential scarring or stenosis; an endoscope                            may pass) stenosis was found 25 to 28 cm from the                            incisors. This stenosis measured 1.1 cm (inner  diameter) x 3 cm (in length). The stenosis was                            traversed. After the rest of the EGD was completed,                            a guidewire was placed and the scope was withdrawn.                            Dilation was performed with a Savary dilator with                            mild resistance at 15 mm and moderate resistance at                            16 mm and 17 mm. The dilation site was examined                            following endoscope reinsertion and showed moderate                            mucosal disruption and complete resolution of                            luminal narrowing and bleeding/oozing as expected                            from previous interventions as well. No evidence of                            a perforation was noted.                           The Z-line was irregular and was found 41 cm from                            the incisors.                           A 3 cm hiatal hernia was present.                           A small  amount of food (residue) was found in the                            gastric body.                           No other gross lesions were noted in the entire                            examined stomach.  No gross lesions were noted in the duodenal bulb,                            in the first portion of the duodenum and in the                            second portion of the duodenum. Complications:            Atrial fibrillation Estimated Blood Loss:     Estimated blood loss was minimal. Impression:               - Normal mucosa was found in the proximal esophagus.                           - LA Grade C esophagitis with no bleeding from                            25-28 cm. In this same region was a                            benign-appearing esophageal stenosis. Dilated up to                            17 mm with moderate resistance. Oozing noted as                            expected that slowed. No evidence of perforation.                           - Z-line irregular, 41 cm from the incisors.                           - 3 cm hiatal hernia.                           - A small amount of food (residue) in the stomach.                            No other gross lesions in the stomach.                           - No gross lesions in the duodenal bulb, in the                            first portion of the duodenum and in the second                            portion of the duodenum. Recommendation:           - The patient will be observed post-procedure,                            until all discharge criteria are met. If he still  has AFib with RVR will likely need to go to the ED                            for further evaluation/treatment as I cannot send                            home safely. Otherwise if stable and improves then                            able to transition to home.                           - Patient has a contact number  available for                            emergencies. The signs and symptoms of potential                            delayed complications were discussed with the                            patient. Return to normal activities tomorrow.                            Written discharge instructions were provided to the                            patient.                           - Dilation diet as per protocol. Liquids today and                            then soft diet tomorrow.                           - Eliquis restart on 8/25.                           - Continue present medications.                           - Repeat upper endoscopy PRN for retreatment.                           - The findings and recommendations were discussed                            with the patient.                           - The findings and recommendations were discussed                            with the patient's family. Justice Britain, MD  11/22/2020 8:42:48 AM

## 2020-11-22 NOTE — Progress Notes (Signed)
0823 HR > 100 with esmolol 25 mg given IV, MD updated, vss

## 2020-11-22 NOTE — Progress Notes (Signed)
0830 HR > 100 with esmolol 25 mg given IV, MD requested, vss

## 2020-11-22 NOTE — Patient Instructions (Signed)
YOU HAD AN ENDOSCOPIC PROCEDURE TODAY AT [facility]:   Refer to the procedure report that was given to you for any specific questions about what was found during the examination.  If the procedure report does not answer your questions, please call your gastroenterologist to clarify.  If you requested that your care partner not be given the details of your procedure findings, then the procedure report has been included in a sealed envelope for you to review at your convenience later.  YOU SHOULD EXPECT: Some feelings of bloating in the abdomen. Passage of more gas than usual.  Walking can help get rid of the air that was put into your GI tract during the procedure and reduce the bloating. If you had a lower endoscopy (such as a colonoscopy or flexible sigmoidoscopy) you may notice spotting of blood in your stool or on the toilet paper. If you underwent a bowel prep for your procedure, you may not have a normal bowel movement for a few days.  Please Note:  You might notice some irritation and congestion in your nose or some drainage.  This is from the oxygen used during your procedure.  There is no need for concern and it should clear up in a day or so.  SYMPTOMS TO REPORT IMMEDIATELY:   Following upper endoscopy (EGD)  Vomiting of blood or coffee ground material  New chest pain or pain under the shoulder blades  Painful or persistently difficult swallowing  New shortness of breath  Fever of 100F or higher  Black, tarry-looking stools  For urgent or emergent issues, a gastroenterologist can be reached at any hour by calling [phone]. Do not use MyChart messaging for urgent concerns.    DIET:  Liquid diet today.  ACTIVITY:  You should plan to take it easy for the rest of today and you should NOT DRIVE or use heavy machinery until tomorrow (because of the sedation medicines used during the test).    FOLLOW UP: Our staff will call the number listed on your records 48-72  hours following your procedure to check on you and address any questions or concerns that you may have regarding the information given to you following your procedure. If we do not reach you, we will leave a message.  We will attempt to reach you two times.  During this call, we will ask if you have developed any symptoms of COVID 19. If you develop any symptoms (ie: fever, flu-like symptoms, shortness of breath, cough etc.) before then, please call [phone].  If you test positive for Covid 19 in the 2 weeks post procedure, please call and report this information to [place].    If any biopsies were taken you will be contacted by phone or by letter within the next 1-3 weeks.  Please call us at [phone] if you have not heard about the biopsies in 3 weeks.    SIGNATURES/CONFIDENTIALITY: You and/or your care partner have signed paperwork which will be entered into your electronic medical record.  These signatures attest to the fact that that the information above on your After Visit Summary has been reviewed and is understood.  Full responsibility of the confidentiality of this discharge information lies with you and/or your care-partner.    Restart Eliquis in 2 days,Resume remainder of medication today. Information given on Liquid diet.

## 2020-11-22 NOTE — Progress Notes (Signed)
Patient re-evaluated in recovery and speaking with his wife. Things are improving with HRs in the 100s-110s with SBPs in the 110-120s. Still recommended EMS evaluation due to the degree/amount of medications that he required during/after his procedure was completed. He still defers and his wife is with him. He understands risks of not going to ED and accepts the risks associated with not going to the ED including angina, cardiac complications, hypotension, death. He will sign AMA for in regards to issues post-procedure. He does understand that if he develops CP/Palpitations/SOB, he needs to call 911 emergently at home. I will forward a note to his primary Cardiologist, to see if follow up can be pursued as he has not been seen in >1 year, to ensure adequate dosing of his medications for his Afib.   Justice Britain, MD [facility] Office # [phone]

## 2020-11-22 NOTE — Progress Notes (Signed)
Pt awake and following commands. HR still 130s to 140s rapid a fib, vss Plan to send via EMS to hospital.

## 2020-11-22 NOTE — Progress Notes (Signed)
Spouse into recovery before pt. Arrival and dr. Into inform of pt. Status.0847-pt. Arrives to recovery and immediately ,"I received instructions to call EMS to transport pt. To hospital,called 911 for transport of pt. Doctor and crna [name] wall remained at bedside with pt. V/s 111/81-127 heart rate a-fib,14 resp,-97% on room air, pt.alert and responsive. 0853-v/s 120/58-120 heart rate,resp 17,oxygen sat on room air 97%. 0901-v/s 117/70,106 heart rate,resp.12 o2 sat-99% 0902- pt. Stated "I feel great". 0904-pt. Talking to doctor and refusing to go to hospital even with doctor advising him to go.Dr. Rush Landmark informs pt that if he does not want to go to hospital that he would need to sign against medical advice form and pt stated "I will sign it". 0905 EMS arrives to transport pt but continues to refuse to be transported. Dr. Rush Landmark informs EMS that pt. Refusing to go. 0910 v/s 120/67,hr-114,resp-10,02 sat-99% 0924 v/s 159/124,hr-104,resp-13,o2 sat-96 % 0927 v/s 110/73 hr-91,resp16,o2 sat-97% 0930 v/s 119/75-hr-89,resp12,o2 sat 98%.  Wife remained at bedside throughout recovery period and Aware of everything,pt. Signed against medical advice form,wife declined to sign amv form.verbal order received to d/c pt.    0939 upon d/c pt. Alert,oriented and responsive,skin warm and dry,respiration non-labored , no distress noted.

## 2020-11-22 NOTE — Progress Notes (Signed)
0810 Robinul 0.1 mg IV given due large amount of secretions upon assessment.  MD made aware, vss

## 2020-11-22 NOTE — Progress Notes (Signed)
VS completed by DT.   Medical history reviewed and updated.  Patient hard of hearing.

## 2020-11-22 NOTE — Progress Notes (Signed)
Patient was in Afib with RVR this AM prior to sedation. He had not taken his BB this morning but had taken it yesterday. Patient received 100 mg Esmolol this AM during procedure to keep him within limits. Procedure completed, but still having Afib with RVR and had to give another 25 mg Esmolol. He will go to recovery, but if Afib isn't rate controlled less than 120 he will need to go via EMS to ED to get his HR controlled for safety reasons. Will discuss and evaluate patient in recovery. Patient's wife made aware.  Justice Britain, MD [facility] Office # [phone]

## 2020-11-22 NOTE — Progress Notes (Signed)
Called to room to assist during endoscopic procedure.  Patient ID and intended procedure confirmed with present staff. Received instructions for my participation in the procedure from the performing physician.  

## 2020-11-24 ENCOUNTER — Inpatient Hospital Stay: Payer: Medicare HMO

## 2020-11-24 ENCOUNTER — Telehealth: Payer: Self-pay

## 2020-11-24 NOTE — Telephone Encounter (Signed)
  Follow up Call-  Call back number 11/22/2020 11/02/2020 08/19/2020 03/11/2020  Post procedure Call Back phone  # [phone]  Permission to leave phone message Yes Yes Yes Yes  Some recent data might be hidden     Patient questions:  Do you have a fever, pain , or abdominal swelling? No. Pain Score  0 *  Have you tolerated food without any problems? Yes.    Have you been able to return to your normal activities? Yes.    Do you have any questions about your discharge instructions: Diet   No. Medications  No. Follow up visit  No.  Do you have questions or concerns about your Care? No.  Actions: * If pain score is 4 or above: No action needed, pain <4.  Have you developed a fever since your procedure? No   2.   Have you had an respiratory symptoms (SOB or cough) since your procedure? No   3.   Have you tested positive for COVID 19 since your procedure no   4.   Have you had any family members/close contacts diagnosed with the COVID 19 since your procedure?  No    If yes to any of these questions please route to Joylene John, RN and Joella Prince, RN

## 2020-11-25 ENCOUNTER — Other Ambulatory Visit: Payer: Self-pay | Admitting: Gastroenterology

## 2020-11-30 ENCOUNTER — Telehealth (HOSPITAL_COMMUNITY): Payer: Self-pay | Admitting: Nurse Practitioner

## 2020-11-30 NOTE — Telephone Encounter (Signed)
Called and left message with patient's spouse to call back to [facility].  Pt needs appt within next 7 days for f/u after having increased heart rates during recent EGD per Dr. Bienville/Dr. Rush Landmark.

## 2020-12-20 ENCOUNTER — Encounter (HOSPITAL_COMMUNITY): Payer: Self-pay | Admitting: Nurse Practitioner

## 2020-12-20 ENCOUNTER — Ambulatory Visit (HOSPITAL_COMMUNITY)
Admission: RE | Admit: 2020-12-20 | Discharge: 2020-12-20 | Disposition: A | Discharge status: Home / Self Care | Payer: Medicare HMO | Source: Ambulatory Visit | Attending: Nurse Practitioner | Admitting: Nurse Practitioner

## 2020-12-20 ENCOUNTER — Other Ambulatory Visit: Payer: Self-pay

## 2020-12-20 VITALS — BP 106/60 | HR 81 | Ht 73.0 in | Wt 215.4 lb

## 2020-12-20 DIAGNOSIS — C3492 Malignant neoplasm of unspecified part of left bronchus or lung: Secondary | ICD-10-CM

## 2020-12-20 DIAGNOSIS — J449 Chronic obstructive pulmonary disease, unspecified: Secondary | ICD-10-CM | POA: Insufficient documentation

## 2020-12-20 DIAGNOSIS — E039 Hypothyroidism, unspecified: Secondary | ICD-10-CM | POA: Diagnosis not present

## 2020-12-20 DIAGNOSIS — Z85118 Personal history of other malignant neoplasm of bronchus and lung: Secondary | ICD-10-CM | POA: Insufficient documentation

## 2020-12-20 DIAGNOSIS — D6869 Other thrombophilia: Secondary | ICD-10-CM

## 2020-12-20 DIAGNOSIS — Z87891 Personal history of nicotine dependence: Secondary | ICD-10-CM | POA: Diagnosis not present

## 2020-12-20 DIAGNOSIS — C3490 Malignant neoplasm of unspecified part of unspecified bronchus or lung: Secondary | ICD-10-CM

## 2020-12-20 DIAGNOSIS — Z7901 Long term (current) use of anticoagulants: Secondary | ICD-10-CM | POA: Insufficient documentation

## 2020-12-20 DIAGNOSIS — Z79899 Other long term (current) drug therapy: Secondary | ICD-10-CM | POA: Diagnosis not present

## 2020-12-20 DIAGNOSIS — I4819 Other persistent atrial fibrillation: Secondary | ICD-10-CM | POA: Diagnosis not present

## 2020-12-20 MED ORDER — APIXABAN 5 MG PO TABS: 5.0000 mg | ORAL_TABLET | Freq: Two times a day (BID) | ORAL | 1 refills | Status: DC

## 2020-12-20 MED ORDER — ATENOLOL 50 MG PO TABS: 50.0000 mg | ORAL_TABLET | Freq: Every day | ORAL | 3 refills | Status: DC

## 2020-12-20 NOTE — Addendum Note (Signed)
Encounter addended by: Juluis Mire, RN on: 12/20/2020 3:56 PM  Actions taken: Pharmacy for encounter modified, Order list changed, Diagnosis association updated

## 2020-12-20 NOTE — Progress Notes (Signed)
Primary Care Physician: [name], MD Referring Physician: Dr. Jeoffrey Massed is a 70 y.o. male with a h/o COPD, previous long term smoker, hypothyroidism,  persistent afib since fall of 2017, small cell lung cancer treated in June 2016 He was initially  placed on flecainide but developed wide complex tachycardia, with subsequent cardiac arrest with out of hospital resuscitation. He was then placed on amiodarone but was this was stopped due to treatment of lung cancer in 2016 and concerns of lung toxicity. He was then hospitalized in October 2017 for sotalol load with cardioversion which was unsuccessful. He has been in rate controlled afib since then. Ablation/and or [name] was discussed with him after failing sotalol but he was not ready for a procedure and thought med would be too expensive, so he opted to continue to live in rate controlled afib.   He is in the afib clinic today as he had a recent endoscopy and his heart rates were elevated. This was reported to Dr. Oval Linsey and she asked for him to have f/u here. Today, he is rate controlled with a HR of 81 bpm. He did not take his atenolol am of endoscopy which the probable cause for elevation of HR for the procedure. Otherwise, he is at his baseline.    Today, he denies symptoms of palpitations, chest pain, shortness of breath, orthopnea, PND, lower extremity edema, dizziness, presyncope, syncope, or neurologic sequela. The patient is tolerating medications without difficulties and is otherwise without complaint today.   Past Medical History:  Diagnosis Date   Adenocarcinoma of left lung, stage 3 ([place]) 08/15/2016   Allergy    Anxiety    Arthritis    Atrial fibrillation ([place]) 08/15/2016   Atrial fibrillation (H[facility])    Cancer, metastatic to bone [facility])    lung   Cataract    Waiting to schedule bilateral cataract surgery   COPD (chronic obstructive pulmonary disease) ([place]) 08/15/2016   Depression    Dysphagia     Dysrhythmia    a-fib   GERD (gastroesophageal reflux disease)    Heart murmur    atenlol for A Fib/Eliquis   History of chemotherapy    History of radiation therapy    Hyperlipidemia    Hypertension    Hypothyroid 08/15/2016   Longstanding persistent atrial fibrillation ([place]) 08/29/2016   Pathologic fracture    left femur   Pneumonitis    S/P TURP 08/15/2016   Wears glasses    Wears hearing aid in both ears    Past Surgical History:  Procedure Laterality Date   BIOPSY  12/21/2019   Procedure: BIOPSY;  Surgeon: Irving Copas., MD;  Location: [place];  Service: Gastroenterology;;   BIOPSY  05/23/2020   Procedure: BIOPSY;  Surgeon: Irving Copas., MD;  Location: [facility];  Service: Gastroenterology;;   BRONCHOSCOPY  10/2014   CARDIAC CATHETERIZATION     05/07/12   CARDIOVERSION     x2   COLONOSCOPY     several yrs   DG BIOPSY LUNG Left 10/2014   FNA - Adenocarcinoma    ESOPHAGOGASTRODUODENOSCOPY (EGD) WITH PROPOFOL N/A 12/21/2019   Procedure: ESOPHAGOGASTRODUODENOSCOPY (EGD) WITH PROPOFOL;  Surgeon: Irving Copas., MD;  Location: [facility] ENDOSCOPY;  Service: Gastroenterology;  Laterality: N/A;   ESOPHAGOGASTRODUODENOSCOPY (EGD) WITH PROPOFOL N/A 05/23/2020   Procedure: ESOPHAGOGASTRODUODENOSCOPY (EGD) WITH PROPOFOL;  Surgeon: Rush Landmark Telford Nab., MD;  Location: [facility] ENDOSCOPY;  Service: Gastroenterology;  Laterality: N/A;   ESOPHAGOGASTRODUODENOSCOPY (EGD) WITH PROPOFOL  N/A 06/30/2020   Procedure: ESOPHAGOGASTRODUODENOSCOPY (EGD) WITH PROPOFOL;  Surgeon: Rush Landmark Telford Nab., MD;  Location: [place];  Service: Gastroenterology;  Laterality: N/A;  ultra slim scope avail   EUS N/A 12/21/2019   Procedure: UPPER ENDOSCOPIC ULTRASOUND (EUS) RADIAL;  Surgeon: Irving Copas., MD;  Location: [place];  Service: Gastroenterology;  Laterality: N/A;   FEMUR IM NAIL Left 02/19/2017   FEMUR IM NAIL Left 02/19/2017   Procedure:  INTRAMEDULLARY (IM) NAIL FEMORAL;  Surgeon: Marchia Bond, MD;  Location: [place];  Service: Orthopedics;  Laterality: Left;   IR FLUORO GUIDE PORT INSERTION RIGHT  07/03/2017   IR US GUIDE VASC ACCESS RIGHT  07/03/2017   LUNG CANCER SURGERY Left 12/2014   Wedge Resection    MULTIPLE TOOTH EXTRACTIONS     SAVORY DILATION N/A 05/23/2020   Procedure: SAVORY DILATION;  Surgeon: Irving Copas., MD;  Location: [facility];  Service: Gastroenterology;  Laterality: N/A;   SAVORY DILATION N/A 06/30/2020   Procedure: SAVORY DILATION;  Surgeon: Rush Landmark Telford Nab., MD;  Location: [place];  Service: Gastroenterology;  Laterality: N/A;   Status post TURP     TONSILLECTOMY     UPPER GASTROINTESTINAL ENDOSCOPY      Current Outpatient Medications  Medication Sig Dispense Refill   acetaminophen (TYLENOL) 500 MG tablet Take 500 mg by mouth every 8 (eight) hours as needed for moderate pain.     albuterol (VENTOLIN HFA) 108 (90 Base) MCG/ACT inhaler INHALE 1 TO 2 PUFFS EVERY 4 HOURS AS NEEDED FOR WHEEZING  OR FOR SHORTNESS OF BREATH 18 g 3   atenolol (TENORMIN) 50 MG tablet TAKE 1 TABLET EVERY DAY 90 tablet 3   atorvastatin (LIPITOR) 40 MG tablet Take 1 tablet (40 mg total) by mouth daily. 90 tablet 0   ELIQUIS 5 MG TABS tablet TAKE 1 TABLET TWICE DAILY 180 tablet 1   esomeprazole (NEXIUM) 40 MG capsule Take 1 capsule (40 mg total) by mouth 2 (two) times daily before a meal. 180 capsule 3   gabapentin (NEURONTIN) 300 MG capsule Take 2 capsules (600 mg total) by mouth at bedtime as needed. 180 capsule 3   ibuprofen (ADVIL,MOTRIN) 400 MG tablet Take 400 mg by mouth every 4 (four) hours as needed for moderate pain.     levothyroxine (SYNTHROID) 200 MCG tablet TAKE 1 TABLET ONE TIME DAILY BEFORE BREAKFAST 90 tablet 0   lipase/protease/amylase (CREON) 36000 UNITS CPEP capsule Take 4 capsules po during each meal. Take 1-2 capsules po during each snack.( up to 2 snacks daily). (Patient taking  differently: Take 36,000-72,000 Units by mouth See admin instructions. Take 2 capsules po during each meal. Take 1 capsule po during each snack.( up to 2 snacks daily).) 480 capsule 3   Oxycodone HCl 10 MG TABS Take 1 tablet (10 mg total) by mouth daily as needed (pain). 30 tablet 0   sucralfate (CARAFATE) 1 g tablet TAKE 1 TABLET BY MOUTH 4 TIMES DAILY WITH MEALS AND AT BEDTIME 90 tablet 0   tamsulosin (FLOMAX) 0.4 MG CAPS capsule Take 1 capsule (0.4 mg total) by mouth daily. 90 capsule 4   venlafaxine XR (EFFEXOR-XR) 37.5 MG 24 hr capsule Take 37.5 mg by mouth 2 (two) times daily.     No current facility-administered medications for this encounter.   Facility-Administered Medications Ordered in Other Encounters  Medication Dose Route Frequency Provider Last Rate Last Admin   sodium chloride flush (NS) 0.9 % injection 10 mL  10 mL Intracatheter PRN  Curt Bears, MD   10 mL at 10/13/20 1028    Allergies  Allergen Reactions   Carboplatin Itching, Nausea And Vomiting and Other (See Comments)    Flushing   Flecainide Hypertension    CAUSED HEART ISSUES    Debrox [Carbamide Peroxide]     Swelling in ear canal.     Social History   Socioeconomic History   Marital status: Married    Spouse name: Not on file   Number of children: Not on file   Years of education: Not on file   Highest education level: Not on file  Occupational History    Comment: retired   Tobacco Use   Smoking status: Former    Packs/day: 1.00    Years: 50.00    Pack years: 50.00    Types: Cigarettes    Quit date: 08/15/2012    Years since quitting: 8.3   Smokeless tobacco: Never  Vaping Use   Vaping Use: Never used  Substance and Sexual Activity   Alcohol use: No   Drug use: No   Sexual activity: Not on file  Other Topics Concern   Not on file  Social History Narrative   [facility] (09/26/16):   Originally from [place]. Moved to [facility] February 2018. Always lived in [place]. Moved to be closer to  children & grandchildren. No international travel. 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 No mold exposure. No bird exposure or hot tub exposure. Enjoys reading. Previously enjoyed wood working with domestic woods.    Social Determinants of Health   Financial Resource Strain: Low Risk    Difficulty of Paying Living Expenses: Not hard at all  Food Insecurity: No Food Insecurity   Worried About Charity fundraiser in the Last Year: Never true   [place] in the Last Year: Never true  Transportation Needs: No Transportation Needs   Lack of Transportation (Medical): No   Lack of Transportation (Non-Medical): No  Physical Activity: Inactive   Days of Exercise per Week: 0 days   Minutes of Exercise per Session: 0 min  Stress: No Stress Concern Present   Feeling of Stress : Not at all  Social Connections: Moderately Isolated   Frequency of Communication with Friends and Family: Twice a week   Frequency of Social Gatherings with Friends and Family: Twice a week   Attends Religious Services: Never   Marine scientist or Organizations: No   Attends Music therapist: Never   Marital Status: Married  Human resources officer Violence: Not At Risk   Fear of Current or Ex-Partner: No   Emotionally Abused: No   Physically Abused: No   Sexually Abused: No    Family History  Problem Relation Age of Onset   Breast cancer Mother    Breast cancer Maternal Aunt    Breast cancer Maternal Aunt    Lung disease Neg Hx    Colon cancer Neg Hx    Stomach cancer Neg Hx    Pancreatic cancer Neg Hx    Rectal cancer Neg Hx    Colon polyps Neg Hx    Esophageal cancer Neg Hx     ROS- All systems are reviewed and negative except as per the HPI above  Physical Exam: Vitals:   12/20/20 1428  BP: 106/60  Pulse: 81  Weight: 97.7 kg  Height: 6\' 1"  (1.854 m)   Wt Readings from Last 3  Encounters:  12/20/20 97.7  kg  11/22/20 92.5 kg  11/02/20 105.7 kg    Labs: Lab Results  Component Value Date   NA 140 10/12/2020   K 4.3 10/12/2020   CL 104 10/12/2020   CO2 28 10/12/2020   GLUCOSE 131 (H) 10/12/2020   BUN 11 10/12/2020   CREATININE 0.72 10/12/2020   CALCIUM 9.4 10/12/2020   Lab Results  Component Value Date   INR 0.98 07/03/2017   Lab Results  Component Value Date   CHOL 122 03/06/2019   HDL 35.90 (L) 03/06/2019   LDLCALC 34 12/19/2016   TRIG 164.0 (H) 09/10/2019     GEN- The patient is well appearing, alert and oriented x 3 today.   Head- normocephalic, atraumatic Eyes-  Sclera clear, conjunctiva pink Ears- hearing intact Oropharynx- clear Neck- supple, no JVP Lymph- no cervical lymphadenopathy Lungs- Clear to ausculation bilaterally, normal work of breathing Heart- irregular rate and rhythm, no murmurs, rubs or gallops, PMI not laterally displaced GI- soft, NT, ND, + BS Extremities- no clubbing, cyanosis, or edema MS- no significant deformity or atrophy Skin- no rash or lesion Psych- euthymic mood, full affect Neuro- strength and sensation are intact  EKG-afib with v rate of 81 with RBBB, qrs int 110  ms, qtc 480 ms Epic records reviewed   Assessment and Plan: 1. Persistent afib since fall 2017 Was initially on Flecainide with proarrythmia effect with wide complex tachycardia, cardiac arrest s/p defibrillation(2014), then treated with amiodarone and stopped(2016 due to concerns for lung toxicity), then hospitalized for sotalol/cardioversion which failed to convert pt  Ablation or Tikosyn were offered in 2018 and pt did not want to pursue either at that time  Recent elevation of HR at time of endoscopy Pt states he did not take his am atenolol day of procedure which is probable explanation of poorly controlled v rates at that time  He is now back on atenolol 50 mg daily with controlled v rates  Continue with eliquis 5 mg bid for chadsvasc score of at lest 2.  2.  S/p lung CA(2016) Per oncology   F/u with Dr. Oval Linsey as scheduled   Geroge Baseman. Kahil Agner, [facility] [address] [place], [place] [address] [phone]

## 2020-12-21 ENCOUNTER — Other Ambulatory Visit (HOSPITAL_COMMUNITY): Payer: Self-pay

## 2020-12-21 MED ORDER — APIXABAN 5 MG PO TABS: 5.0000 mg | ORAL_TABLET | Freq: Two times a day (BID) | ORAL | 0 refills | Status: DC

## 2020-12-27 ENCOUNTER — Ambulatory Visit: Payer: Medicare HMO

## 2020-12-29 ENCOUNTER — Encounter: Payer: Self-pay | Admitting: Family Medicine

## 2020-12-30 ENCOUNTER — Other Ambulatory Visit: Payer: Self-pay

## 2020-12-30 DIAGNOSIS — N401 Enlarged prostate with lower urinary tract symptoms: Secondary | ICD-10-CM

## 2020-12-30 DIAGNOSIS — R3911 Hesitancy of micturition: Secondary | ICD-10-CM

## 2020-12-30 MED ORDER — TAMSULOSIN HCL 0.4 MG PO CAPS
0.4000 mg | ORAL_CAPSULE | Freq: Every day | ORAL | 4 refills | Status: DC
Start: 2020-12-30 — End: 2022-01-22

## 2021-01-04 ENCOUNTER — Telehealth: Payer: Self-pay | Admitting: Physician Assistant

## 2021-01-04 NOTE — Progress Notes (Deleted)
[place] OFFICE PROGRESS NOTE  [name], MD [place] [address]  DIAGNOSIS: Metastatic non-small cell lung cancer initially diagnosed as stage IIIA (T2a, N2, M0) non-small cell lung cancer, poorly differentiated adenocarcinoma presented with left lower lobe lung mass in addition to mediastinal lymphadenopathy.  The patient was diagnosed with metastatic disease involving the left femur as well as left supraclavicular nodal metastases and right paratracheal lymphadenopathy in October 2018.   Biomarker Findings Microsatellite Status - MS-Stable Tumor Mutational Burden - TMB-Low (3 Muts/Mb) Genomic Findings For a complete list of the genes assayed, please refer to the Appendix. STK11 P247f*6 [id number] site 1233+0Q>TDAXX E374* MLL2 [id number] NBN K23354f NOTCH2 [id number] splice site 988354+5G>YDisease relevant genes with no reportable alterations: EGFR, KRAS, ALK, BRAF, MET, RET, ERBB2, ROS1    PDL1 expression 5%  PRIOR THERAPY: 1) status post wedge resection of the left lower lobe lung mass as well as AP window lymph node dissection but there was residual metastatic mediastinal lymphadenopathy that could not be resected. 2) a course of concurrent chemoradiation [place] carboplatin [place]  3) status post palliative radiotherapy to the left femur metastatic bone disease. 4)  Systemic chemotherapy with carboplatin for AUC of 5, Alimta 500 mg/M2 and Keytruda 200 mg IV every 3 weeks.  First dose March 07, 2017.  Carboplatin was discontinued during cycle #2 secondary to hypersensitivity reaction. 5) status post 2 cycles of maintenance treatment with Alimta and Ketruda (pembrolizumab).  Alimta was discontinued secondary to intolerance.  CURRENT THERAPY: Maintenance treatment with single agent Ketruda (pembrolizumab) status post 53 cycles.  His treatment has been on hold since January 2022  secondary to intolerance.  INTERVAL HISTORY: Victor Huber 75o. male returns to clinic today for follow-up visit.  The patient was last seen in clinic on 10/13/2020.  The patient [facility] having some difficulties with pancreatitis and weight loss.  The patient's treatment has been on hold since January 2022.  The patient is feeling ***today.  Dysphagia Dr. ManStefani Damaddy?  Patient denies any recent fever, chills, or night sweats.  He denies any chest pain, shortness of breath, cough, or hemoptysis.  Denies any nausea, vomiting, diarrhea, or constipation.  Denies any headache or visual changes.  The patient recently had a restaging CT scan performed.  He is here today for evaluation to review his scan results.    MEDICAL HISTORY: Past Medical History:  Diagnosis Date   Adenocarcinoma of left lung, stage 3 ([place]5/16/2018   Allergy    Anxiety    Arthritis    Atrial fibrillation ([place]CC[place]5/16/2018   Atrial fibrillation (HCC)    [facility]   Cataract    Waiting to schedule bilateral cataract surgery   COPD (chronic obstructive pulmonary disease) ([place]CC[place]5/16/2018   Depression    Dysphagia    Dysrhythmia    a-fib   GERD (gastroesophageal reflux disease)    Heart murmur    atenlol for A Fib/Eliquis   History of chemotherapy    History of radiation therapy    Hyperlipidemia    Hypertension    Hypothyroid 08/15/2016   Longstanding persistent atrial fibrillation ([place]5/30/2018   Pathologic fracture    left femur   Pneumonitis    S/P TURP 08/15/2016   Wears glasses    Wears hearing aid  in both ears     ALLERGIES:  is allergic to carboplatin, flecainide, and debrox [carbamide peroxide].  MEDICATIONS:  Current Outpatient Medications  Medication Sig Dispense Refill   acetaminophen (TYLENOL) 500 MG tablet Take 500 mg by mouth every 8 (eight) hours as needed for  moderate pain.     albuterol (VENTOLIN HFA) 108 (90 Base) MCG/ACT inhaler INHALE 1 TO 2 PUFFS EVERY 4 HOURS AS NEEDED FOR WHEEZING  OR FOR SHORTNESS OF BREATH 18 g 3   apixaban (ELIQUIS) 5 MG TABS tablet Take 1 tablet (5 mg total) by mouth 2 (two) times daily. 28 tablet 0   atenolol (TENORMIN) 50 MG tablet Take 1 tablet (50 mg total) by mouth daily. 90 tablet 3   atorvastatin (LIPITOR) 40 MG tablet Take 1 tablet (40 mg total) by mouth daily. 90 tablet 0   esomeprazole (NEXIUM) 40 MG capsule Take 1 capsule (40 mg total) by mouth 2 (two) times daily before a meal. 180 capsule 3   gabapentin (NEURONTIN) 300 MG capsule Take 2 capsules (600 mg total) by mouth at bedtime as needed. 180 capsule 3   ibuprofen (ADVIL,MOTRIN) 400 MG tablet Take 400 mg by mouth every 4 (four) hours as needed for moderate pain.     levothyroxine (SYNTHROID) 200 MCG tablet TAKE 1 TABLET ONE TIME DAILY BEFORE BREAKFAST 90 tablet 0   lipase/protease/amylase (CREON) 36000 UNITS CPEP capsule Take 4 capsules po during each meal. Take 1-2 capsules po during each snack.( up to 2 snacks daily). (Patient taking differently: Take 36,000-72,000 Units by mouth See admin instructions. Take 2 capsules po during each meal. Take 1 capsule po during each snack.( up to 2 snacks daily).) 480 capsule 3   Oxycodone HCl 10 MG TABS Take 1 tablet (10 mg total) by mouth daily as needed (pain). 30 tablet 0   sucralfate (CARAFATE) 1 g tablet TAKE 1 TABLET BY MOUTH 4 TIMES DAILY WITH MEALS AND AT BEDTIME 90 tablet 0   tamsulosin (FLOMAX) 0.4 MG CAPS capsule Take 1 capsule (0.4 mg total) by mouth daily. 90 capsule 4   venlafaxine XR (EFFEXOR-XR) 37.5 MG 24 hr capsule Take 37.5 mg by mouth 2 (two) times daily.     No current facility-administered medications for this visit.   Facility-Administered Medications Ordered in Other Visits  Medication Dose Route Frequency Provider Last Rate Last Admin   sodium chloride flush (NS) 0.9 % injection 10 mL  10 mL  Intracatheter PRN Curt Bears, MD   10 mL at 10/13/20 1028    SURGICAL HISTORY:  Past Surgical History:  Procedure Laterality Date   BIOPSY  12/21/2019   Procedure: BIOPSY;  Surgeon: Irving Copas., MD;  Location: [facility];  Service: Gastroenterology;;   BIOPSY  05/23/2020   Procedure: BIOPSY;  Surgeon: Irving Copas., MD;  Location: [facility];  Service: Gastroenterology;;   BRONCHOSCOPY  10/2014   CARDIAC CATHETERIZATION     05/07/12   CARDIOVERSION     x2   COLONOSCOPY     several yrs   DG BIOPSY LUNG Left 10/2014   FNA - Adenocarcinoma    ESOPHAGOGASTRODUODENOSCOPY (EGD) WITH PROPOFOL N/A 12/21/2019   Procedure: ESOPHAGOGASTRODUODENOSCOPY (EGD) WITH PROPOFOL;  Surgeon: Irving Copas., MD;  Location: [place];  Service: Gastroenterology;  Laterality: N/A;   ESOPHAGOGASTRODUODENOSCOPY (EGD) WITH PROPOFOL N/A 05/23/2020   Procedure: ESOPHAGOGASTRODUODENOSCOPY (EGD) WITH PROPOFOL;  Surgeon: Rush Landmark Telford Nab., MD;  Location: [facility];  Service: Gastroenterology;  Laterality: N/A;   ESOPHAGOGASTRODUODENOSCOPY (EGD)  WITH PROPOFOL N/A 06/30/2020   Procedure: ESOPHAGOGASTRODUODENOSCOPY (EGD) WITH PROPOFOL;  Surgeon: Rush Landmark Telford Nab., MD;  Location: [place];  Service: Gastroenterology;  Laterality: N/A;  ultra slim scope avail   EUS N/A 12/21/2019   Procedure: UPPER ENDOSCOPIC ULTRASOUND (EUS) RADIAL;  Surgeon: Irving Copas., MD;  Location: [place];  Service: Gastroenterology;  Laterality: N/A;   FEMUR IM NAIL Left 02/19/2017   FEMUR IM NAIL Left 02/19/2017   Procedure: INTRAMEDULLARY (IM) NAIL FEMORAL;  Surgeon: Marchia Bond, MD;  Location: [place];  Service: Orthopedics;  Laterality: Left;   IR FLUORO GUIDE PORT INSERTION RIGHT  07/03/2017   IR US GUIDE VASC ACCESS RIGHT  07/03/2017   LUNG CANCER SURGERY Left 12/2014   Wedge Resection    MULTIPLE TOOTH EXTRACTIONS     SAVORY DILATION N/A 05/23/2020    Procedure: SAVORY DILATION;  Surgeon: Irving Copas., MD;  Location: [facility];  Service: Gastroenterology;  Laterality: N/A;   SAVORY DILATION N/A 06/30/2020   Procedure: SAVORY DILATION;  Surgeon: Rush Landmark Telford Nab., MD;  Location: [facility];  Service: Gastroenterology;  Laterality: N/A;   Status post TURP     TONSILLECTOMY     UPPER GASTROINTESTINAL ENDOSCOPY      REVIEW OF SYSTEMS:   Review of Systems  Constitutional: Negative for appetite change, chills, fatigue, fever and unexpected weight change.  HENT:   Negative for mouth sores, nosebleeds, sore throat and trouble swallowing.   Eyes: Negative for eye problems and icterus.  Respiratory: Negative for cough, hemoptysis, shortness of breath and wheezing.   Cardiovascular: Negative for chest pain and leg swelling.  Gastrointestinal: Negative for abdominal pain, constipation, diarrhea, nausea and vomiting.  Genitourinary: Negative for bladder incontinence, difficulty urinating, dysuria, frequency and hematuria.   Musculoskeletal: Negative for back pain, gait problem, neck pain and neck stiffness.  Skin: Negative for itching and rash.  Neurological: Negative for dizziness, extremity weakness, gait problem, headaches, light-headedness and seizures.  Hematological: Negative for adenopathy. Does not bruise/bleed easily.  Psychiatric/Behavioral: Negative for confusion, depression and sleep disturbance. The patient is not nervous/anxious.     PHYSICAL EXAMINATION:  There were no vitals taken for this visit.  ECOG PERFORMANCE STATUS: {CHL ONC ECOG [id number]  Physical Exam  Constitutional: Oriented to person, place, and time and well-developed, well-nourished, and in no distress. No distress.  HENT:  Head: Normocephalic and atraumatic.  Mouth/Throat: Oropharynx is clear and moist. No oropharyngeal exudate.  Eyes: Conjunctivae are normal. Right eye exhibits no discharge. Left eye exhibits no discharge. No scleral  icterus.  Neck: Normal range of motion. Neck supple.  Cardiovascular: Normal rate, regular rhythm, normal heart sounds and intact distal pulses.   Pulmonary/Chest: Effort normal and breath sounds normal. No respiratory distress. No wheezes. No rales.  Abdominal: Soft. Bowel sounds are normal. Exhibits no distension and no mass. There is no tenderness.  Musculoskeletal: Normal range of motion. Exhibits no edema.  Lymphadenopathy:    No cervical adenopathy.  Neurological: Alert and oriented to person, place, and time. Exhibits normal muscle tone. Gait normal. Coordination normal.  Skin: Skin is warm and dry. No rash noted. Not diaphoretic. No erythema. No pallor.  Psychiatric: Mood, memory and judgment normal.  Vitals reviewed.  LABORATORY DATA: Lab Results  Component Value Date   WBC 7.6 10/12/2020   HGB 14.0 10/12/2020   HCT 42.0 10/12/2020   MCV 96.8 10/12/2020   PLT 188 10/12/2020      Chemistry      Component Value Date/Time  NA 140 10/12/2020 1251   NA 136 04/04/2017 1141   K 4.3 10/12/2020 1251   K 4.4 04/04/2017 1141   CL 104 10/12/2020 1251   CO2 28 10/12/2020 1251   CO2 24 04/04/2017 1141   BUN 11 10/12/2020 1251   BUN 21.1 04/04/2017 1141   CREATININE 0.72 10/12/2020 1251   CREATININE 1.1 04/04/2017 1141      Component Value Date/Time   CALCIUM 9.4 10/12/2020 1251   CALCIUM 9.1 04/04/2017 1141   ALKPHOS 100 10/12/2020 1251   ALKPHOS 89 04/04/2017 1141   AST 14 (L) 10/12/2020 1251   AST 17 04/04/2017 1141   ALT 13 10/12/2020 1251   ALT 18 04/04/2017 1141   BILITOT 0.6 10/12/2020 1251   BILITOT 0.67 04/04/2017 1141       RADIOGRAPHIC STUDIES:  No results found.   ASSESSMENT/PLAN:  This is a very pleasant 70 year old Caucasian male with metastatic non-small cell lung cancer, adenocarcinoma.  He was initially diagnosed as a stage IIIa.  He presented with a left lower lobe lung mass in addition to mediastinal lymphadenopathy.  He was diagnosed in  January 2016.  He is status post left lower lobectomy and lymph node dissection followed by concurrent chemoradiation.  His PD-L1 expression is 5%.  He was diagnosed with metastatic disease involving the left femur as well as a left supraclavicular nodal metastasis and right paratracheal lymphadenopathy in October 2018.  He was started on systemic chemotherapy with carboplatin, Alimta, and Keytruda.  Carboplatin was discontinued after cycle 2 due to hypersensitivity reaction.  Alimta was discontinued after cycle 4 due to intolerance.  The patient then was on maintenance single agent immunotherapy with [facility] for 53 cycles.  This was ultimately discontinued due to recurrent pancreatitis January 2022.  The patient was seen with Dr. Julien Nordmann today.  Labs were reviewed.  The patient recently had a restaging CT scan performed.  Dr. Julien Nordmann personally and independently reviewed the scan discussed results with the patient today.  The scan showed ***  Recommend that he **on observation with restaging CT scan of the chest in 3 months.  The patient was advised to call immediately if he has any concerning symptoms in the interval. The patient voices understanding of current disease status and treatment options and is in agreement with the current care plan. All questions were answered. The patient knows to call the clinic with any problems, questions or concerns. We can certainly see the patient much sooner if necessary      No orders of the defined types were placed in this encounter.    I spent {CHL ONC TIME VISIT - CZYSA:[id number]} counseling the patient face to face. The total time spent in the appointment was {CHL ONC TIME VISIT - ATFTD:[id number]}.  Krish Bailly L Nelissa Bolduc, PA-C 01/04/21

## 2021-01-04 NOTE — Telephone Encounter (Signed)
I am scheduled to see the patient next week on 01/11/2021.  He is supposed to have a restaging CT scan performed before his visit which has not been scheduled at this time.  I called the patient and left a detailed voicemail instructing him to call radiology scheduling to schedule his CT scan prior to his appointment next week.

## 2021-01-05 ENCOUNTER — Inpatient Hospital Stay: Payer: Medicare HMO

## 2021-01-10 ENCOUNTER — Other Ambulatory Visit: Payer: Self-pay

## 2021-01-10 ENCOUNTER — Ambulatory Visit (HOSPITAL_COMMUNITY)
Admission: RE | Admit: 2021-01-10 | Discharge: 2021-01-10 | Disposition: A | Discharge status: Home / Self Care | Payer: Medicare HMO | Source: Ambulatory Visit | Attending: Internal Medicine | Admitting: Internal Medicine

## 2021-01-10 ENCOUNTER — Inpatient Hospital Stay: Payer: Medicare HMO | Attending: Internal Medicine

## 2021-01-10 DIAGNOSIS — Z79890 Hormone replacement therapy (postmenopausal): Secondary | ICD-10-CM | POA: Diagnosis not present

## 2021-01-10 DIAGNOSIS — F419 Anxiety disorder, unspecified: Secondary | ICD-10-CM | POA: Diagnosis not present

## 2021-01-10 DIAGNOSIS — E039 Hypothyroidism, unspecified: Secondary | ICD-10-CM | POA: Diagnosis not present

## 2021-01-10 DIAGNOSIS — C771 Secondary and unspecified malignant neoplasm of intrathoracic lymph nodes: Secondary | ICD-10-CM | POA: Diagnosis not present

## 2021-01-10 DIAGNOSIS — I4891 Unspecified atrial fibrillation: Secondary | ICD-10-CM | POA: Diagnosis not present

## 2021-01-10 DIAGNOSIS — Z7901 Long term (current) use of anticoagulants: Secondary | ICD-10-CM | POA: Diagnosis not present

## 2021-01-10 DIAGNOSIS — C3432 Malignant neoplasm of lower lobe, left bronchus or lung: Secondary | ICD-10-CM | POA: Insufficient documentation

## 2021-01-10 DIAGNOSIS — Z79899 Other long term (current) drug therapy: Secondary | ICD-10-CM | POA: Insufficient documentation

## 2021-01-10 DIAGNOSIS — Z923 Personal history of irradiation: Secondary | ICD-10-CM | POA: Insufficient documentation

## 2021-01-10 DIAGNOSIS — I10 Essential (primary) hypertension: Secondary | ICD-10-CM | POA: Insufficient documentation

## 2021-01-10 DIAGNOSIS — K8020 Calculus of gallbladder without cholecystitis without obstruction: Secondary | ICD-10-CM | POA: Diagnosis not present

## 2021-01-10 DIAGNOSIS — Z791 Long term (current) use of non-steroidal anti-inflammatories (NSAID): Secondary | ICD-10-CM | POA: Diagnosis not present

## 2021-01-10 DIAGNOSIS — C3490 Malignant neoplasm of unspecified part of unspecified bronchus or lung: Secondary | ICD-10-CM

## 2021-01-10 DIAGNOSIS — F32A Depression, unspecified: Secondary | ICD-10-CM | POA: Diagnosis not present

## 2021-01-10 DIAGNOSIS — K5790 Diverticulosis of intestine, part unspecified, without perforation or abscess without bleeding: Secondary | ICD-10-CM | POA: Diagnosis not present

## 2021-01-10 DIAGNOSIS — J439 Emphysema, unspecified: Secondary | ICD-10-CM | POA: Insufficient documentation

## 2021-01-10 DIAGNOSIS — E785 Hyperlipidemia, unspecified: Secondary | ICD-10-CM | POA: Insufficient documentation

## 2021-01-10 DIAGNOSIS — Z9221 Personal history of antineoplastic chemotherapy: Secondary | ICD-10-CM | POA: Insufficient documentation

## 2021-01-10 DIAGNOSIS — C7951 Secondary malignant neoplasm of bone: Secondary | ICD-10-CM | POA: Insufficient documentation

## 2021-01-10 DIAGNOSIS — K219 Gastro-esophageal reflux disease without esophagitis: Secondary | ICD-10-CM | POA: Diagnosis not present

## 2021-01-10 DIAGNOSIS — J90 Pleural effusion, not elsewhere classified: Secondary | ICD-10-CM | POA: Diagnosis not present

## 2021-01-10 DIAGNOSIS — Z95828 Presence of other vascular implants and grafts: Secondary | ICD-10-CM

## 2021-01-10 LAB — CMP (CANCER CENTER ONLY)
ALT: 9 U/L (ref 0–44)
AST: 13 U/L — ABNORMAL LOW (ref 15–41)
Albumin: 3.9 g/dL (ref 3.5–5.0)
Alkaline Phosphatase: 83 U/L (ref 38–126)
Anion gap: 9 (ref 5–15)
BUN: 15 mg/dL (ref 8–23)
CO2: 24 mmol/L (ref 22–32)
Calcium: 9.4 mg/dL (ref 8.9–10.3)
Chloride: 106 mmol/L (ref 98–111)
Creatinine: 0.71 mg/dL (ref 0.61–1.24)
GFR, Estimated: >60 mL/min (ref >60)
Glucose, Bld: 107 mg/dL — ABNORMAL HIGH (ref 70–99)
Potassium: 4.4 mmol/L (ref 3.5–5.1)
Sodium: 139 mmol/L (ref 135–145)
Total Bilirubin: 0.7 mg/dL (ref 0.3–1.2)
Total Protein: 6.8 g/dL (ref 6.5–8.1)

## 2021-01-10 LAB — CBC WITH DIFFERENTIAL (CANCER CENTER ONLY)
Abs Immature Granulocytes: 0.02 10*3/uL (ref 0.00–0.07)
Basophils Absolute: 0 10*3/uL (ref 0.0–0.1)
Basophils Relative: 0 %
Eosinophils Absolute: 0.1 10*3/uL (ref 0.0–0.5)
Eosinophils Relative: 1 %
HCT: 41.9 % (ref 39.0–52.0)
Hemoglobin: 14.6 g/dL (ref 13.0–17.0)
Immature Granulocytes: 0 %
Lymphocytes Relative: 30 %
Lymphs Abs: 2.3 10*3/uL (ref 0.7–4.0)
MCH: 32.5 pg (ref 26.0–34.0)
MCHC: 34.8 g/dL (ref 30.0–36.0)
MCV: 93.3 fL (ref 80.0–100.0)
Monocytes Absolute: 0.6 10*3/uL (ref 0.1–1.0)
Monocytes Relative: 7 %
Neutro Abs: 4.8 10*3/uL (ref 1.7–7.7)
Neutrophils Relative %: 62 %
Platelet Count: 187 10*3/uL (ref 150–400)
RBC: 4.49 MIL/uL (ref 4.22–5.81)
RDW: 13.7 % (ref 11.5–15.5)
WBC Count: 7.7 10*3/uL (ref 4.0–10.5)
nRBC: 0 % (ref 0.0–0.2)

## 2021-01-10 MED ORDER — IOHEXOL 350 MG/ML SOLN
80.0000 mL | Freq: Once | INTRAVENOUS | Status: AC | PRN
  Administered 2021-01-10: 80 mL via INTRAVENOUS

## 2021-01-10 MED ORDER — HEPARIN SOD (PORK) LOCK FLUSH 100 UNIT/ML IV SOLN
INTRAVENOUS | Status: AC
  Administered 2021-01-10: 500 [IU] via INTRAVENOUS
  Filled 2021-01-10: qty 5

## 2021-01-10 MED ORDER — HEPARIN SOD (PORK) LOCK FLUSH 100 UNIT/ML IV SOLN: 500.0000 [IU] | Freq: Once | INTRAVENOUS | Status: AC

## 2021-01-10 MED ORDER — IOHEXOL 9 MG/ML PO SOLN
ORAL | Status: AC
  Administered 2021-01-10: 1000 mL via ORAL
  Filled 2021-01-10: qty 1000

## 2021-01-10 MED ORDER — IOHEXOL 9 MG/ML PO SOLN: 1000.0000 mL | ORAL | Status: AC

## 2021-01-10 MED ORDER — SODIUM CHLORIDE 0.9% FLUSH
10.0000 mL | INTRAVENOUS | Status: DC | PRN
  Administered 2021-01-10: 10 mL

## 2021-01-11 ENCOUNTER — Encounter: Payer: Self-pay | Admitting: Internal Medicine

## 2021-01-11 ENCOUNTER — Ambulatory Visit: Payer: Medicare HMO | Admitting: Internal Medicine

## 2021-01-11 ENCOUNTER — Inpatient Hospital Stay (HOSPITAL_BASED_OUTPATIENT_CLINIC_OR_DEPARTMENT_OTHER): Payer: Medicare HMO | Admitting: Internal Medicine

## 2021-01-11 VITALS — BP 111/83 | HR 45 | Temp 98.0°F | Resp 20 | Ht 73.0 in | Wt 214.3 lb

## 2021-01-11 DIAGNOSIS — C3490 Malignant neoplasm of unspecified part of unspecified bronchus or lung: Secondary | ICD-10-CM | POA: Diagnosis not present

## 2021-01-11 DIAGNOSIS — C771 Secondary and unspecified malignant neoplasm of intrathoracic lymph nodes: Secondary | ICD-10-CM | POA: Diagnosis not present

## 2021-01-11 DIAGNOSIS — F419 Anxiety disorder, unspecified: Secondary | ICD-10-CM | POA: Diagnosis not present

## 2021-01-11 DIAGNOSIS — Z923 Personal history of irradiation: Secondary | ICD-10-CM | POA: Diagnosis not present

## 2021-01-11 DIAGNOSIS — C3432 Malignant neoplasm of lower lobe, left bronchus or lung: Secondary | ICD-10-CM | POA: Diagnosis not present

## 2021-01-11 DIAGNOSIS — C7951 Secondary malignant neoplasm of bone: Secondary | ICD-10-CM | POA: Diagnosis not present

## 2021-01-11 DIAGNOSIS — E785 Hyperlipidemia, unspecified: Secondary | ICD-10-CM | POA: Diagnosis not present

## 2021-01-11 DIAGNOSIS — Z9221 Personal history of antineoplastic chemotherapy: Secondary | ICD-10-CM | POA: Diagnosis not present

## 2021-01-11 DIAGNOSIS — F32A Depression, unspecified: Secondary | ICD-10-CM | POA: Diagnosis not present

## 2021-01-11 DIAGNOSIS — E039 Hypothyroidism, unspecified: Secondary | ICD-10-CM | POA: Diagnosis not present

## 2021-01-11 NOTE — Progress Notes (Signed)
[place] [phone]   [phone]  OFFICE PROGRESS NOTE  Victor Barrack, MD [address] Victor Huber  DIAGNOSIS: Metastatic non-small cell lung cancer initially diagnosed as stage IIIA (T2a, N2, M0) non-small cell lung cancer, poorly differentiated adenocarcinoma presented with left lower lobe lung mass in addition to mediastinal lymphadenopathy.  The patient was diagnosed with metastatic disease involving the left femur as well as left supraclavicular nodal metastases and right paratracheal lymphadenopathy in October 2018.  Biomarker Findings Microsatellite Status - MS-Stable Tumor Mutational Burden - TMB-Low (3 Muts/Mb) Genomic Findings For a complete list of the genes assayed, please refer to the Appendix. STK11 P256f*6 [id number] site 1469+6E>XDAXX E374* MLL2 [id number] NBN [id number] NOTCH2 [id number] splice site [id number]>UDisease relevant genes with no reportable alterations: EGFR, KRAS, ALK, BRAF, MET, RET, ERBB2, ROS1   PDL1 expression 5%  PRIOR THERAPY: 1) status post wedge resection of the left lower lobe lung mass as well as AP window lymph node dissection but there was residual metastatic mediastinal lymphadenopathy that could not be resected. 2) a course of [place] chemorad[place] carb[place]  3) status post palliative radiotherapy to the left femur metastatic bone disease. 4)  Systemic chemotherapy with carboplatin for AUC of 5, Alimta 500 mg/M2 and Keytruda 200 mg IV every 3 weeks.  First dose March 07, 2017.  Carboplatin was discontinued during cycle #2 secondary to hypersensitivity reaction. 5) status post 2 cycles of maintenance treatment with Alimta and Ketruda (pembrolizumab).  Alimta was discontinued secondary to intolerance.  CURRENT THERAPY: Maintenance treatment with single agent Ketruda (pembrolizumab) status post 49  cycles.   His treatment has been on hold since January 2022 secondary to intolerance.  INTERVAL HISTORY: [name] 89o. male returns to the clinic today for follow-up visit.  The patient is feeling fine today with no concerning complaints except for the baseline fatigue and shortness of breath with exertion.  He denied having any current chest pain or hemoptysis.  He denied having any recent nausea, vomiting, diarrhea or constipation.  He has no headache or visual changes.  He has been in observation for the last 10 months and he has been doing fine with no concerning issues.  He had repeat CT scan of the chest, abdomen pelvis performed yesterday and he is here for evaluation and discussion of his discuss results.  MEDICAL HISTORY: Past Medical History:  Diagnosis Date   Adenocarcinoma of left lung, stage 3 (HCC[place]5/16/2018   Allergy    Anxiety    Arthritis    Atrial fibrillation ([place]CC[place]5/16/2018   Atrial fibrillation (HCC)    [facility]   Cataract    Waiting to schedule bilateral cataract surgery   COPD (chronic obstructive pulmonary disease) (HCCJessie5/16/2018   Depression    Dysphagia    Dysrhythmia    a-fib   GERD (gastroesophageal reflux disease)    Heart murmur    atenlol for A Fib/Eliquis   History of chemotherapy    History of radiation therapy    Hyperlipidemia    Hypertension    Hypothyroid 08/15/2016   Longstanding persistent atrial fibrillation (HCCDur[place]5/30/2018   Pathologic fracture    left femur   Pneumonitis    S/P TURP 08/15/2016   Wears glasses    Wears hearing aid in both ears     ALLERGIES:  is allergic to carboplatin, flecainide, and debrox [carbamide peroxide].  MEDICATIONS:  Current Outpatient Medications  Medication Sig Dispense Refill   acetaminophen (TYLENOL) 500 MG tablet Take 500 mg by mouth every 8 (eight) hours as needed for moderate pain.     albuterol (VENTOLIN HFA) 108 (90 Base) MCG/ACT inhaler INHALE 1 TO 2  PUFFS EVERY 4 HOURS AS NEEDED FOR WHEEZING  OR FOR SHORTNESS OF BREATH 18 g 3   apixaban (ELIQUIS) 5 MG TABS tablet Take 1 tablet (5 mg total) by mouth 2 (two) times daily. 28 tablet 0   atenolol (TENORMIN) 50 MG tablet Take 1 tablet (50 mg total) by mouth daily. 90 tablet 3   atorvastatin (LIPITOR) 40 MG tablet Take 1 tablet (40 mg total) by mouth daily. 90 tablet 0   esomeprazole (NEXIUM) 40 MG capsule Take 1 capsule (40 mg total) by mouth 2 (two) times daily before a meal. 180 capsule 3   gabapentin (NEURONTIN) 300 MG capsule Take 2 capsules (600 mg total) by mouth at bedtime as needed. 180 capsule 3   ibuprofen (ADVIL,MOTRIN) 400 MG tablet Take 400 mg by mouth every 4 (four) hours as needed for moderate pain.     levothyroxine (SYNTHROID) 200 MCG tablet TAKE 1 TABLET ONE TIME DAILY BEFORE BREAKFAST 90 tablet 0   lipase/protease/amylase (CREON) 36000 UNITS CPEP capsule Take 4 capsules po during each meal. Take 1-2 capsules po during each snack.( up to 2 snacks daily). (Patient taking differently: Take 36,000-72,000 Units by mouth See admin instructions. Take 2 capsules po during each meal. Take 1 capsule po during each snack.( up to 2 snacks daily).) 480 capsule 3   Oxycodone HCl 10 MG TABS Take 1 tablet (10 mg total) by mouth daily as needed (pain). 30 tablet 0   sucralfate (CARAFATE) 1 g tablet TAKE 1 TABLET BY MOUTH 4 TIMES DAILY WITH MEALS AND AT BEDTIME 90 tablet 0   tamsulosin (FLOMAX) 0.4 MG CAPS capsule Take 1 capsule (0.4 mg total) by mouth daily. 90 capsule 4   venlafaxine XR (EFFEXOR-XR) 37.5 MG 24 hr capsule Take 37.5 mg by mouth 2 (two) times daily.     No current facility-administered medications for this visit.   Facility-Administered Medications Ordered in Other Visits  Medication Dose Route Frequency Provider Last Rate Last Admin   sodium chloride flush (NS) 0.9 % injection 10 mL  10 mL Intracatheter PRN Curt Bears, MD   10 mL at 10/13/20 1028    SURGICAL HISTORY:   Past Surgical History:  Procedure Laterality Date   BIOPSY  12/21/2019   Procedure: BIOPSY;  Surgeon: Irving Copas., MD;  Location: [facility];  Service: Gastroenterology;;   BIOPSY  05/23/2020   Procedure: BIOPSY;  Surgeon: Irving Copas., MD;  Location: [facility];  Service: Gastroenterology;;   BRONCHOSCOPY  10/2014   CARDIAC CATHETERIZATION     05/07/12   CARDIOVERSION     x2   COLONOSCOPY     several yrs   DG BIOPSY LUNG Left 10/2014   FNA - Adenocarcinoma    ESOPHAGOGASTRODUODENOSCOPY (EGD) WITH PROPOFOL N/A 12/21/2019   Procedure: ESOPHAGOGASTRODUODENOSCOPY (EGD) WITH PROPOFOL;  Surgeon: Irving Copas., MD;  Location: [facility];  Service: Gastroenterology;  Laterality: N/A;   ESOPHAGOGASTRODUODENOSCOPY (EGD) WITH PROPOFOL N/A 05/23/2020   Procedure: ESOPHAGOGASTRODUODENOSCOPY (EGD) WITH PROPOFOL;  Surgeon: Rush Landmark Telford Nab., MD;  Location: [facility];  Service: Gastroenterology;  Laterality: N/A;   ESOPHAGOGASTRODUODENOSCOPY (EGD) WITH PROPOFOL N/A 06/30/2020   Procedure: ESOPHAGOGASTRODUODENOSCOPY (EGD)  WITH PROPOFOL;  Surgeon: [name], Telford Nab., MD;  Location: [facility];  Service: Gastroenterology;  Laterality: N/A;  ultra slim scope avail   EUS N/A 12/21/2019   Procedure: UPPER ENDOSCOPIC ULTRASOUND (EUS) RADIAL;  Surgeon: Irving Copas., MD;  Location: [facility];  Service: Gastroenterology;  Laterality: N/A;   FEMUR IM NAIL Left 02/19/2017   FEMUR IM NAIL Left 02/19/2017   Procedure: INTRAMEDULLARY (IM) NAIL FEMORAL;  Surgeon: Marchia Bond, MD;  Location: [facility];  Service: Orthopedics;  Laterality: Left;   IR FLUORO GUIDE PORT INSERTION RIGHT  07/03/2017   IR US GUIDE VASC ACCESS RIGHT  07/03/2017   LUNG CANCER SURGERY Left 12/2014   Wedge Resection    MULTIPLE TOOTH EXTRACTIONS     SAVORY DILATION N/A 05/23/2020   Procedure: SAVORY DILATION;  Surgeon: Irving Copas., MD;  Location: [facility];   Service: Gastroenterology;  Laterality: N/A;   SAVORY DILATION N/A 06/30/2020   Procedure: SAVORY DILATION;  Surgeon: Rush Landmark Telford Nab., MD;  Location: [facility];  Service: Gastroenterology;  Laterality: N/A;   Status post TURP     TONSILLECTOMY     UPPER GASTROINTESTINAL ENDOSCOPY      REVIEW OF SYSTEMS:  A comprehensive review of systems was negative except for: Constitutional: positive for fatigue Respiratory: positive for dyspnea on exertion   PHYSICAL EXAMINATION: General appearance: alert, cooperative, fatigued, and no distress Head: Normocephalic, without obvious abnormality, atraumatic Neck: no adenopathy, no JVD, supple, symmetrical, trachea midline, and thyroid not enlarged, symmetric, no tenderness/mass/nodules Lymph nodes: Cervical, supraclavicular, and axillary nodes normal. Resp: clear to auscultation bilaterally Back: symmetric, no curvature. ROM normal. No CVA tenderness. Cardio: regular rate and rhythm, S1, S2 normal, no murmur, click, rub or gallop GI: soft, non-tender; bowel sounds normal; no masses,  no organomegaly Extremities: extremities normal, atraumatic, no cyanosis or edema  ECOG PERFORMANCE STATUS: 1 - Symptomatic but completely ambulatory  Blood pressure 111/83, pulse (!) 45, temperature 98 F (36.7 C), temperature source Tympanic, resp. rate 20, height 6' 1"  (1.854 m), weight 214 lb 4.8 oz (97.2 kg), SpO2 94 %.  LABORATORY DATA: Lab Results  Component Value Date   WBC 7.7 01/10/2021   HGB 14.6 01/10/2021   HCT 41.9 01/10/2021   MCV 93.3 01/10/2021   PLT 187 01/10/2021      Chemistry      Component Value Date/Time   NA 139 01/10/2021 1546   NA 136 04/04/2017 1141   K 4.4 01/10/2021 1546   K 4.4 04/04/2017 1141   CL 106 01/10/2021 1546   CO2 24 01/10/2021 1546   CO2 24 04/04/2017 1141   BUN 15 01/10/2021 1546   BUN 21.1 04/04/2017 1141   CREATININE 0.71 01/10/2021 1546   CREATININE 1.1 04/04/2017 1141      Component Value  Date/Time   CALCIUM 9.4 01/10/2021 1546   CALCIUM 9.1 04/04/2017 1141   ALKPHOS 83 01/10/2021 1546   ALKPHOS 89 04/04/2017 1141   AST 13 (L) 01/10/2021 1546   AST 17 04/04/2017 1141   ALT 9 01/10/2021 1546   ALT 18 04/04/2017 1141   BILITOT 0.7 01/10/2021 1546   BILITOT 0.67 04/04/2017 1141       RADIOGRAPHIC STUDIES: CT Chest W Contrast  Result Date: 01/11/2021 CLINICAL DATA:  Non-small-cell lung cancer.  Restaging. EXAM: CT CHEST, ABDOMEN, AND PELVIS WITH CONTRAST TECHNIQUE: Multidetector CT imaging of the chest, abdomen and pelvis was performed following the standard protocol during bolus administration of intravenous contrast. CONTRAST:  28m  OMNIPAQUE IOHEXOL 350 MG/ML SOLN COMPARISON:  10/12/2020 FINDINGS: CT CHEST FINDINGS Cardiovascular: The heart size is normal. No substantial pericardial effusion. Coronary artery calcification is evident. Mild atherosclerotic calcification is noted in the wall of the thoracic aorta. Enlargement of the pulmonary outflow tract and main pulmonary arteries suggests pulmonary arterial hypertension. Right-sided Port-A-Cath tip is positioned in the upper right atrium. Mediastinum/Nodes: Index right paratracheal node measured 11 mm short axis on the prior study is 11 mm short axis again today on 18/2. Index subcarinal node is 16 mm short axis today on 34/2 compared to 16 mm when remeasured in a similar fashion on the prior study. No right hilar lymphadenopathy post treatment changes noted in the left hilum. No axillary lymphadenopathy. Lungs/Pleura: Advanced changes of emphysema bilaterally with bullous disease in the right lung apex. Radiation fibrosis noted parahilar left lung, similar to prior. New interstitial and consolidative airspace disease is seen in the anterior left lung (image 63/7) potentially of all vein radiation change or infectious/inflammatory etiology. Tiny left pleural effusion is similar to prior. Tiny right subpleural nodule on 98/7 is  unchanged in the interval. Musculoskeletal: No worrisome lytic or sclerotic osseous abnormality. CT ABDOMEN PELVIS FINDINGS Hepatobiliary: No suspicious focal abnormality within the liver parenchyma. Tiny hypodensity lateral segment left liver is stable in the interval, likely benign. Calcified gallstones again noted. No intrahepatic or extrahepatic biliary dilation. Pancreas: No focal mass lesion. No dilatation of the main duct. No intraparenchymal cyst. No peripancreatic edema. Spleen: No splenomegaly. No focal mass lesion. Adrenals/Urinary Tract: No adrenal nodule or mass. Small bilateral renal cysts again noted No evidence for hydroureter. The urinary bladder appears normal for the degree of distention. Stomach/Bowel: Stomach is unremarkable. No gastric wall thickening. No evidence of outlet obstruction. Duodenum is normally positioned as is the ligament of Treitz. No small bowel wall thickening. No small bowel dilatation. The terminal ileum is normal. The appendix is normal. No gross colonic mass. No colonic wall thickening. Diverticular changes are noted in the left colon without evidence of diverticulitis. Vascular/Lymphatic: There is moderate atherosclerotic calcification of the abdominal aorta without aneurysm. There is no gastrohepatic or hepatoduodenal ligament lymphadenopathy. No retroperitoneal or mesenteric lymphadenopathy. Upper normal portal caval node is stable. No pelvic sidewall lymphadenopathy. Reproductive: Prostate gland is enlarged. Other: No intraperitoneal free fluid. Musculoskeletal: No worrisome lytic or sclerotic osseous abnormality. IMPRESSION: 1. New interstitial and consolidative airspace disease in the anterior left lung, potentially evolution of radiation change or new infectious/inflammatory process. Recurrent disease not entirely excluded. Close attention on follow-up recommended. 2. Otherwise no new or progressive interval findings. Index mildly enlarged mediastinal lymph nodes  are stable. 3. Similar appearance of tiny left pleural effusion. 4. Enlargement of the pulmonary outflow tract and main pulmonary arteries suggests pulmonary arterial hypertension. 5. Cholelithiasis. 6. Prostatomegaly. 7. Aortic Atherosclerosis (ICD10-I70.0). Electronically Signed   By: Misty Stanley M.D.   On: 01/11/2021 15:29   CT Abdomen Pelvis W Contrast  Result Date: 01/11/2021 CLINICAL DATA:  Non-small-cell lung cancer.  Restaging. EXAM: CT CHEST, ABDOMEN, AND PELVIS WITH CONTRAST TECHNIQUE: Multidetector CT imaging of the chest, abdomen and pelvis was performed following the standard protocol during bolus administration of intravenous contrast. CONTRAST:  81m OMNIPAQUE IOHEXOL 350 MG/ML SOLN COMPARISON:  10/12/2020 FINDINGS: CT CHEST FINDINGS Cardiovascular: The heart size is normal. No substantial pericardial effusion. Coronary artery calcification is evident. Mild atherosclerotic calcification is noted in the wall of the thoracic aorta. Enlargement of the pulmonary outflow tract and main pulmonary arteries suggests pulmonary  arterial hypertension. Right-sided Port-A-Cath tip is positioned in the upper right atrium. Mediastinum/Nodes: Index right paratracheal node measured 11 mm short axis on the prior study is 11 mm short axis again today on 18/2. Index subcarinal node is 16 mm short axis today on 34/2 compared to 16 mm when remeasured in a similar fashion on the prior study. No right hilar lymphadenopathy post treatment changes noted in the left hilum. No axillary lymphadenopathy. Lungs/Pleura: Advanced changes of emphysema bilaterally with bullous disease in the right lung apex. Radiation fibrosis noted parahilar left lung, similar to prior. New interstitial and consolidative airspace disease is seen in the anterior left lung (image 63/7) potentially of all vein radiation change or infectious/inflammatory etiology. Tiny left pleural effusion is similar to prior. Tiny right subpleural nodule on 98/7  is unchanged in the interval. Musculoskeletal: No worrisome lytic or sclerotic osseous abnormality. CT ABDOMEN PELVIS FINDINGS Hepatobiliary: No suspicious focal abnormality within the liver parenchyma. Tiny hypodensity lateral segment left liver is stable in the interval, likely benign. Calcified gallstones again noted. No intrahepatic or extrahepatic biliary dilation. Pancreas: No focal mass lesion. No dilatation of the main duct. No intraparenchymal cyst. No peripancreatic edema. Spleen: No splenomegaly. No focal mass lesion. Adrenals/Urinary Tract: No adrenal nodule or mass. Small bilateral renal cysts again noted No evidence for hydroureter. The urinary bladder appears normal for the degree of distention. Stomach/Bowel: Stomach is unremarkable. No gastric wall thickening. No evidence of outlet obstruction. Duodenum is normally positioned as is the ligament of Treitz. No small bowel wall thickening. No small bowel dilatation. The terminal ileum is normal. The appendix is normal. No gross colonic mass. No colonic wall thickening. Diverticular changes are noted in the left colon without evidence of diverticulitis. Vascular/Lymphatic: There is moderate atherosclerotic calcification of the abdominal aorta without aneurysm. There is no gastrohepatic or hepatoduodenal ligament lymphadenopathy. No retroperitoneal or mesenteric lymphadenopathy. Upper normal portal caval node is stable. No pelvic sidewall lymphadenopathy. Reproductive: Prostate gland is enlarged. Other: No intraperitoneal free fluid. Musculoskeletal: No worrisome lytic or sclerotic osseous abnormality. IMPRESSION: 1. New interstitial and consolidative airspace disease in the anterior left lung, potentially evolution of radiation change or new infectious/inflammatory process. Recurrent disease not entirely excluded. Close attention on follow-up recommended. 2. Otherwise no new or progressive interval findings. Index mildly enlarged mediastinal lymph nodes  are stable. 3. Similar appearance of tiny left pleural effusion. 4. Enlargement of the pulmonary outflow tract and main pulmonary arteries suggests pulmonary arterial hypertension. 5. Cholelithiasis. 6. Prostatomegaly. 7. Aortic Atherosclerosis (ICD10-I70.0). Electronically Signed   By: Misty Stanley M.D.   On: 01/11/2021 15:29     ASSESSMENT AND PLAN: This is a 70 years old white male with metastatic non-small cell lung cancer, adenocarcinoma with no actionable mutations and PDL 1 expression of 5% that was initially diagnosed as stage IIIa non-small cell lung cancer, adenocarcinoma status post left lower lobectomy with lymph node dissection followed by a course of concurrent chemoradiation completed in January 2016.  The patient had evidence for disease metastasis in October 2018 with metastatic disease to the left femur as well as left supraclavicular and right paratracheal lymph nodes. The patient is currently on systemic chemotherapy initially was with carboplatin, Alimta and Keytruda.  Carboplatin was discontinued secondary to hypersensitivity reaction starting from cycle #2.   He was also treated with 3 cycles of maintenance Alimta and Ketruda (pembrolizumab) but Alimta was discontinued secondary to intolerance. He is currently undergoing treatment with maintenance Keytruda as a single agent status post  49 cycles.  The patient has been tolerating his treatment fairly well with no concerning complaints but the patient continues to have a lot of issues with his arthritis especially in the knees and ankles.  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 His last scan showed no concerning findings for disease progression but there was new 8 mm nodule in the left lung that need close monitoring on the upcoming imaging studies.  The patient has been on observation since January 2022. The patient has been doing well for the last 10 months or so with no concerning complaints.  He  actually felt much better after discontinuing his treatment with Keytruda. The patient has been doing fine on observation with no concerning issues. He had repeat CT scan of the chest, abdomen pelvis performed recently.  The final report of the scan was not available at the time of the visit but I personally and independently reviewed the scan and then he noticed some increased in the interstitial and airspace disease in the left lung that probably inflammatory in origin.  This was confirmed by the final report of the scan later today. I called the patient and discussed the results with him. Regarding the bradycardia he is currently on atenolol and followed by the atrial fibrillation clinic.  I advised him to reach out to his cardiologist for recommendation regarding his bradycardia and if there is any concerning issues or adjustment of his medication needed. For the Port-A-Cath, will continue Port-A-Cath flush every 6 weeks. For the hypothyroidism he is currently on levothyroxine by his primary care physician. The patient was advised to call immediately if he has any other concerning symptoms in the interval.  The patient voices understanding of current disease status and treatment options and is in agreement with the current care plan. All questions were answered. The patient knows to call the clinic with any problems, questions or concerns. We can certainly see the patient much sooner if necessary.  Disclaimer: This note was dictated with voice recognition software. Similar sounding words can inadvertently be transcribed and may not be corrected upon review.

## 2021-01-12 ENCOUNTER — Telehealth: Payer: Self-pay | Admitting: Internal Medicine

## 2021-01-12 NOTE — Telephone Encounter (Signed)
Scheduled follow-up appointments per 10/12 los. Patient is aware.

## 2021-01-25 ENCOUNTER — Other Ambulatory Visit: Payer: Self-pay | Admitting: Gastroenterology

## 2021-01-25 ENCOUNTER — Other Ambulatory Visit: Payer: Self-pay | Admitting: Family Medicine

## 2021-01-25 DIAGNOSIS — E785 Hyperlipidemia, unspecified: Secondary | ICD-10-CM

## 2021-02-07 ENCOUNTER — Other Ambulatory Visit: Payer: Self-pay

## 2021-02-07 NOTE — Telephone Encounter (Signed)
LAST APPOINTMENT DATE:  04/15/20  NEXT APPOINTMENT DATE: None  MEDICATION:venlafaxine XR (EFFEXOR-XR) 37.5 MG 24 hr capsule  PHARMACY: [facility] [place]

## 2021-02-08 MED ORDER — VENLAFAXINE HCL ER 37.5 MG PO CP24: 37.5000 mg | ORAL_CAPSULE | Freq: Two times a day (BID) | ORAL | 3 refills | Status: DC

## 2021-02-09 ENCOUNTER — Other Ambulatory Visit: Payer: Self-pay | Admitting: Gastroenterology

## 2021-02-14 ENCOUNTER — Other Ambulatory Visit: Payer: Self-pay

## 2021-02-14 MED ORDER — PANCRELIPASE (LIP-PROT-AMYL) 36000-114000 UNITS PO CPEP: ORAL_CAPSULE | ORAL | 3 refills | Status: DC

## 2021-02-14 NOTE — Progress Notes (Signed)
Rx has been printed and sent with patient patient assistance form to [facility].

## 2021-02-19 ENCOUNTER — Encounter: Payer: Self-pay | Admitting: Gastroenterology

## 2021-02-20 ENCOUNTER — Other Ambulatory Visit: Payer: Medicare HMO

## 2021-02-21 ENCOUNTER — Inpatient Hospital Stay: Payer: Medicare HMO

## 2021-04-04 ENCOUNTER — Other Ambulatory Visit: Payer: Medicare HMO

## 2021-04-11 ENCOUNTER — Other Ambulatory Visit: Payer: Self-pay

## 2021-04-11 ENCOUNTER — Ambulatory Visit (HOSPITAL_COMMUNITY)
Admission: RE | Admit: 2021-04-11 | Discharge: 2021-04-11 | Disposition: A | Discharge status: Home / Self Care | Payer: Medicare HMO | Source: Ambulatory Visit | Attending: Internal Medicine | Admitting: Internal Medicine

## 2021-04-11 ENCOUNTER — Inpatient Hospital Stay: Payer: Medicare HMO | Attending: Internal Medicine

## 2021-04-11 DIAGNOSIS — I700 Atherosclerosis of aorta: Secondary | ICD-10-CM | POA: Diagnosis not present

## 2021-04-11 DIAGNOSIS — C7951 Secondary malignant neoplasm of bone: Secondary | ICD-10-CM | POA: Diagnosis not present

## 2021-04-11 DIAGNOSIS — Z902 Acquired absence of lung [part of]: Secondary | ICD-10-CM | POA: Diagnosis not present

## 2021-04-11 DIAGNOSIS — M170 Bilateral primary osteoarthritis of knee: Secondary | ICD-10-CM | POA: Insufficient documentation

## 2021-04-11 DIAGNOSIS — J181 Lobar pneumonia, unspecified organism: Secondary | ICD-10-CM | POA: Diagnosis not present

## 2021-04-11 DIAGNOSIS — Z9221 Personal history of antineoplastic chemotherapy: Secondary | ICD-10-CM | POA: Diagnosis not present

## 2021-04-11 DIAGNOSIS — K8020 Calculus of gallbladder without cholecystitis without obstruction: Secondary | ICD-10-CM | POA: Diagnosis not present

## 2021-04-11 DIAGNOSIS — N400 Enlarged prostate without lower urinary tract symptoms: Secondary | ICD-10-CM | POA: Diagnosis not present

## 2021-04-11 DIAGNOSIS — C3490 Malignant neoplasm of unspecified part of unspecified bronchus or lung: Secondary | ICD-10-CM

## 2021-04-11 DIAGNOSIS — Z95828 Presence of other vascular implants and grafts: Secondary | ICD-10-CM

## 2021-04-11 DIAGNOSIS — C778 Secondary and unspecified malignant neoplasm of lymph nodes of multiple regions: Secondary | ICD-10-CM | POA: Insufficient documentation

## 2021-04-11 DIAGNOSIS — D3500 Benign neoplasm of unspecified adrenal gland: Secondary | ICD-10-CM | POA: Diagnosis not present

## 2021-04-11 DIAGNOSIS — E039 Hypothyroidism, unspecified: Secondary | ICD-10-CM | POA: Diagnosis not present

## 2021-04-11 DIAGNOSIS — R918 Other nonspecific abnormal finding of lung field: Secondary | ICD-10-CM | POA: Diagnosis not present

## 2021-04-11 DIAGNOSIS — J432 Centrilobular emphysema: Secondary | ICD-10-CM | POA: Diagnosis not present

## 2021-04-11 DIAGNOSIS — C3432 Malignant neoplasm of lower lobe, left bronchus or lung: Secondary | ICD-10-CM | POA: Insufficient documentation

## 2021-04-11 DIAGNOSIS — N281 Cyst of kidney, acquired: Secondary | ICD-10-CM | POA: Diagnosis not present

## 2021-04-11 LAB — CBC WITH DIFFERENTIAL (CANCER CENTER ONLY)
Abs Immature Granulocytes: 0.02 10*3/uL (ref 0.00–0.07)
Basophils Absolute: 0 10*3/uL (ref 0.0–0.1)
Basophils Relative: 0 %
Eosinophils Absolute: 0.1 10*3/uL (ref 0.0–0.5)
Eosinophils Relative: 1 %
HCT: 43.1 % (ref 39.0–52.0)
Hemoglobin: 15.4 g/dL (ref 13.0–17.0)
Immature Granulocytes: 0 %
Lymphocytes Relative: 26 %
Lymphs Abs: 2.2 10*3/uL (ref 0.7–4.0)
MCH: 33.3 pg (ref 26.0–34.0)
MCHC: 35.7 g/dL (ref 30.0–36.0)
MCV: 93.1 fL (ref 80.0–100.0)
Monocytes Absolute: 0.5 10*3/uL (ref 0.1–1.0)
Monocytes Relative: 6 %
Neutro Abs: 5.5 10*3/uL (ref 1.7–7.7)
Neutrophils Relative %: 67 %
Platelet Count: 160 10*3/uL (ref 150–400)
RBC: 4.63 MIL/uL (ref 4.22–5.81)
RDW: 13.3 % (ref 11.5–15.5)
WBC Count: 8.2 10*3/uL (ref 4.0–10.5)
nRBC: 0 % (ref 0.0–0.2)

## 2021-04-11 LAB — CMP (CANCER CENTER ONLY)
ALT: 18 U/L (ref 0–44)
AST: 17 U/L (ref 15–41)
Albumin: 4.3 g/dL (ref 3.5–5.0)
Alkaline Phosphatase: 90 U/L (ref 38–126)
Anion gap: 6 (ref 5–15)
BUN: 17 mg/dL (ref 8–23)
CO2: 28 mmol/L (ref 22–32)
Calcium: 9.3 mg/dL (ref 8.9–10.3)
Chloride: 102 mmol/L (ref 98–111)
Creatinine: 0.74 mg/dL (ref 0.61–1.24)
GFR, Estimated: >60 mL/min (ref >60)
Glucose, Bld: 124 mg/dL — ABNORMAL HIGH (ref 70–99)
Potassium: 4.5 mmol/L (ref 3.5–5.1)
Sodium: 136 mmol/L (ref 135–145)
Total Bilirubin: 0.9 mg/dL (ref 0.3–1.2)
Total Protein: 6.8 g/dL (ref 6.5–8.1)

## 2021-04-11 MED ORDER — SODIUM CHLORIDE (PF) 0.9 % IJ SOLN
INTRAMUSCULAR | Status: AC
  Filled 2021-04-11: qty 50

## 2021-04-11 MED ORDER — HEPARIN SOD (PORK) LOCK FLUSH 100 UNIT/ML IV SOLN
INTRAVENOUS | Status: AC
  Filled 2021-04-11: qty 5

## 2021-04-11 MED ORDER — IOHEXOL 9 MG/ML PO SOLN
ORAL | Status: AC
  Administered 2021-04-11: 500 mL via ORAL
  Filled 2021-04-11: qty 1000

## 2021-04-11 MED ORDER — HEPARIN SOD (PORK) LOCK FLUSH 100 UNIT/ML IV SOLN: 500.0000 [IU] | Freq: Once | INTRAVENOUS | Status: DC

## 2021-04-11 MED ORDER — SODIUM CHLORIDE 0.9% FLUSH
10.0000 mL | INTRAVENOUS | Status: DC | PRN
  Administered 2021-04-11: 10 mL

## 2021-04-11 MED ORDER — IOHEXOL 350 MG/ML SOLN
80.0000 mL | Freq: Once | INTRAVENOUS | Status: AC | PRN
  Administered 2021-04-11: 80 mL via INTRAVENOUS

## 2021-04-12 ENCOUNTER — Other Ambulatory Visit (HOSPITAL_COMMUNITY): Payer: Self-pay | Admitting: *Deleted

## 2021-04-12 MED ORDER — APIXABAN 5 MG PO TABS: 5.0000 mg | ORAL_TABLET | Freq: Two times a day (BID) | ORAL | 1 refills | Status: DC

## 2021-04-13 ENCOUNTER — Inpatient Hospital Stay (HOSPITAL_BASED_OUTPATIENT_CLINIC_OR_DEPARTMENT_OTHER): Payer: Medicare HMO | Admitting: Internal Medicine

## 2021-04-13 ENCOUNTER — Other Ambulatory Visit: Payer: Self-pay

## 2021-04-13 ENCOUNTER — Encounter: Payer: Self-pay | Admitting: Internal Medicine

## 2021-04-13 VITALS — BP 117/67 | HR 75 | Temp 97.8°F | Resp 20 | Ht 73.0 in | Wt 226.9 lb

## 2021-04-13 DIAGNOSIS — C3490 Malignant neoplasm of unspecified part of unspecified bronchus or lung: Secondary | ICD-10-CM

## 2021-04-13 DIAGNOSIS — Z902 Acquired absence of lung [part of]: Secondary | ICD-10-CM | POA: Diagnosis not present

## 2021-04-13 DIAGNOSIS — E039 Hypothyroidism, unspecified: Secondary | ICD-10-CM | POA: Diagnosis not present

## 2021-04-13 DIAGNOSIS — C3432 Malignant neoplasm of lower lobe, left bronchus or lung: Secondary | ICD-10-CM | POA: Diagnosis not present

## 2021-04-13 DIAGNOSIS — C3492 Malignant neoplasm of unspecified part of left bronchus or lung: Secondary | ICD-10-CM | POA: Diagnosis not present

## 2021-04-13 DIAGNOSIS — C7951 Secondary malignant neoplasm of bone: Secondary | ICD-10-CM

## 2021-04-13 DIAGNOSIS — Z9221 Personal history of antineoplastic chemotherapy: Secondary | ICD-10-CM | POA: Diagnosis not present

## 2021-04-13 DIAGNOSIS — C778 Secondary and unspecified malignant neoplasm of lymph nodes of multiple regions: Secondary | ICD-10-CM | POA: Diagnosis not present

## 2021-04-13 DIAGNOSIS — M170 Bilateral primary osteoarthritis of knee: Secondary | ICD-10-CM | POA: Diagnosis not present

## 2021-04-13 NOTE — Progress Notes (Signed)
Victor [phone]   [phone]  OFFICE PROGRESS NOTE  Victor Barrack, MD [address] [place] [address]  DIAGNOSIS: Metastatic non-small cell lung cancer initially diagnosed as stage IIIA (T2a, N2, M0) non-small cell lung cancer, poorly differentiated adenocarcinoma presented with left lower lobe lung mass in addition to mediastinal lymphadenopathy.  The patient was diagnosed with metastatic disease involving the left femur as well as left supraclavicular nodal metastases and right paratracheal lymphadenopathy in October 2018.  Biomarker Findings Microsatellite Status - MS-Stable Tumor Mutational Burden - TMB-Low (3 Muts/Mb) Genomic Findings For a complete list of the genes assayed, please refer to the Appendix. STK11 P269f*6 [id number] site 1213+0Q>MDAXX E374* MLL2 [id number] NBN K2332f NOTCH2 [id number] splice site [id number]+5M>WDisease relevant genes with no reportable alterations: EGFR, KRAS, ALK, BRAF, MET, RET, ERBB2, ROS1   PDL1 expression 5%  PRIOR THERAPY: 1) status post wedge resection of the left lower lobe lung mass as well as AP window lymph node dissection but there was residual metastatic mediastinal lymphadenopathy that could not be resected. 2) a course of concurrent chemorad[place] carboplatin [place]  3) status post palliative radiotherapy to the left femur metastatic bone disease. 4)  Systemic chemotherapy with carboplatin for AUC of 5, Alimta 500 mg/M2 and Keytruda 200 mg IV every 3 weeks.  First dose March 07, 2017.  Carboplatin was discontinued during cycle #2 secondary to hypersensitivity reaction. 5) status post 2 cycles of maintenance treatment with Alimta and Ketruda (pembrolizumab).  Alimta was discontinued secondary to intolerance.  CURRENT THERAPY: Maintenance treatment with single agent Ketruda (pembrolizumab) status post 73  cycles.   His treatment has been on hold since January 2022 secondary to intolerance.  INTERVAL HISTORY: Victor Huber 71o. male returns to the clinic today for follow-up visit.  The patient was accompanied by his wife.  He is feeling fine today with no concerning complaints.  He denied having any current chest pain, shortness of breath, cough or hemoptysis.  He has no nausea, vomiting, diarrhea or constipation.  He has no headache or visual changes.  He has no recent weight loss or night sweats.  The patient has been on observation for the last year and doing well.  He had repeat CT scan of the chest, abdomen pelvis performed recently and he is here for evaluation and discussion of his scan results.  MEDICAL HISTORY: Past Medical History:  Diagnosis Date   Adenocarcinoma of left lung, stage 3 ([place]   Allergy    Anxiety    Arthritis    Atrial fibrillation ([place]   Atrial fibrillation (HCC)    [facility]   Cataract    Waiting to schedule bilateral cataract surgery   COPD (chronic obstructive pulmonary disease) ([place]/16/2018   Depression    Dysphagia    Dysrhythmia    a-fib   GERD (gastroesophageal reflux disease)    Heart murmur    atenlol for A Fib/Eliquis   History of chemotherapy    History of radiation therapy    Hyperlipidemia    Hypertension    Hypothyroid 08/15/2016   Longstanding persistent atrial fibrillation ([place]/30/2018   Pathologic fracture    left femur   Pneumonitis    S/P TURP 08/15/2016   Wears glasses    Wears hearing aid in both ears     ALLERGIES:  is allergic to carboplatin, flecainide, and debrox [carbamide peroxide].  MEDICATIONS:  Current Outpatient Medications  Medication Sig Dispense Refill   acetaminophen (TYLENOL) 500 MG tablet Take 500 mg by mouth every 8 (eight) hours as needed for moderate pain.     albuterol (VENTOLIN HFA) 108 (90 Base) MCG/ACT inhaler INHALE 1 TO 2 PUFFS EVERY 4 HOURS  AS NEEDED FOR WHEEZING  OR FOR SHORTNESS OF BREATH 18 g 3   apixaban (ELIQUIS) 5 MG TABS tablet Take 1 tablet (5 mg total) by mouth 2 (two) times daily. 180 tablet 1   atenolol (TENORMIN) 50 MG tablet Take 1 tablet (50 mg total) by mouth daily. 90 tablet 3   atorvastatin (LIPITOR) 40 MG tablet TAKE 1 TABLET EVERY DAY 90 tablet 0   esomeprazole (NEXIUM) 40 MG capsule TAKE 1 CAPSULE (40 MG TOTAL) BY MOUTH 2 (TWO) TIMES DAILY BEFORE A MEAL. 180 capsule 3   gabapentin (NEURONTIN) 300 MG capsule Take 2 capsules (600 mg total) by mouth at bedtime as needed. 180 capsule 3   ibuprofen (ADVIL,MOTRIN) 400 MG tablet Take 400 mg by mouth every 4 (four) hours as needed for moderate pain.     levothyroxine (SYNTHROID) 200 MCG tablet TAKE 1 TABLET ONE TIME DAILY BEFORE BREAKFAST 90 tablet 0   lipase/protease/amylase (CREON) 36000 UNITS CPEP capsule Take 4 capsules po during each meal. Take 1-2 capsules po during each snack.( up to 2 snacks daily). 480 capsule 3   Oxycodone HCl 10 MG TABS Take 1 tablet (10 mg total) by mouth daily as needed (pain). 30 tablet 0   sucralfate (CARAFATE) 1 g tablet TAKE 1 TABLET BY MOUTH 4 TIMES DAILY WITH MEALS AND AT BEDTIME 90 tablet 0   tamsulosin (FLOMAX) 0.4 MG CAPS capsule Take 1 capsule (0.4 mg total) by mouth daily. 90 capsule 4   venlafaxine XR (EFFEXOR-XR) 37.5 MG 24 hr capsule Take 1 capsule (37.5 mg total) by mouth 2 (two) times daily. 180 capsule 3   No current facility-administered medications for this visit.   Facility-Administered Medications Ordered in Other Visits  Medication Dose Route Frequency Provider Last Rate Last Admin   sodium chloride flush (NS) 0.9 % injection 10 mL  10 mL Intracatheter PRN Curt Bears, MD   10 mL at 10/13/20 1028    SURGICAL HISTORY:  Past Surgical History:  Procedure Laterality Date   BIOPSY  12/21/2019   Procedure: BIOPSY;  Surgeon: Victor Huber., MD;  Location: [place];  Service: Gastroenterology;;   BIOPSY   05/23/2020   Procedure: BIOPSY;  Surgeon: Victor Huber., MD;  Location: [facility];  Service: Gastroenterology;;   BRONCHOSCOPY  10/2014   CARDIAC CATHETERIZATION     05/07/12   CARDIOVERSION     x2   COLONOSCOPY     several yrs   DG BIOPSY LUNG Left 10/2014   FNA - Adenocarcinoma    ESOPHAGOGASTRODUODENOSCOPY (EGD) WITH PROPOFOL N/A 12/21/2019   Procedure: ESOPHAGOGASTRODUODENOSCOPY (EGD) WITH PROPOFOL;  Surgeon: Victor Huber., MD;  Location: [place];  Service: Gastroenterology;  Laterality: N/A;   ESOPHAGOGASTRODUODENOSCOPY (EGD) WITH PROPOFOL N/A 05/23/2020   Procedure: ESOPHAGOGASTRODUODENOSCOPY (EGD) WITH PROPOFOL;  Surgeon: Rush Landmark Telford Nab., MD;  Location: [facility];  Service: Gastroenterology;  Laterality: N/A;   ESOPHAGOGASTRODUODENOSCOPY (EGD) WITH PROPOFOL N/A 06/30/2020   Procedure: ESOPHAGOGASTRODUODENOSCOPY (EGD) WITH PROPOFOL;  Surgeon: Rush Landmark Telford Nab., MD;  Location: [place];  Service: Gastroenterology;  Laterality: N/A;  ultra slim scope avail   EUS N/A 12/21/2019  Procedure: UPPER ENDOSCOPIC ULTRASOUND (EUS) RADIAL;  Surgeon: Rush Landmark Telford Nab., MD;  Location[place];  Service: Gastroenterology;  Laterality: N/A;   FEMUR IM NAIL Left 02/19/2017   FEMUR IM NAIL Left 02/19/2017   Procedure: INTRAMEDULLARY (IM) NAIL FEMORAL;  Surgeon: Marchia Bond, MD;  Location: [place];  Service: Orthopedics;  Laterality: Left;   IR FLUORO GUIDE PORT INSERTION RIGHT  07/03/2017   IR US GUIDE VASC ACCESS RIGHT  07/03/2017   LUNG CANCER SURGERY Left 12/2014   Wedge Resection    MULTIPLE TOOTH EXTRACTIONS     SAVORY DILATION N/A 05/23/2020   Procedure: SAVORY DILATION;  Surgeon: Victor Huber., MD;  Location: [facility];  Service: Gastroenterology;  Laterality: N/A;   SAVORY DILATION N/A 06/30/2020   Procedure: SAVORY DILATION;  Surgeon: Rush Landmark Telford Nab., MD;  Location: [place];  Service: Gastroenterology;   Laterality: N/A;   Status post TURP     TONSILLECTOMY     UPPER GASTROINTESTINAL ENDOSCOPY      REVIEW OF SYSTEMS:  A comprehensive review of systems was negative.   PHYSICAL EXAMINATION: General appearance: alert, cooperative, and no distress Head: Normocephalic, without obvious abnormality, atraumatic Neck: no adenopathy, no JVD, supple, symmetrical, trachea midline, and thyroid not enlarged, symmetric, no tenderness/mass/nodules Lymph nodes: Cervical, supraclavicular, and axillary nodes normal. Resp: clear to auscultation bilaterally Back: symmetric, no curvature. ROM normal. No CVA tenderness. Cardio: regular rate and rhythm, S1, S2 normal, no murmur, click, rub or gallop GI: soft, non-tender; bowel sounds normal; no masses,  no organomegaly Extremities: extremities normal, atraumatic, no cyanosis or edema  ECOG PERFORMANCE STATUS: 1 - Symptomatic but completely ambulatory  Blood pressure 117/67, pulse 75, temperature 97.8 F (36.6 C), temperature source Tympanic, resp. rate 20, height _0  (1.854 m), weight 226 lb 14.4 oz (102.9 kg), SpO2 96 %.  LABORATORY DATA: Lab Results  Component Value Date   WBC 8.2 04/11/2021   HGB 15.4 04/11/2021   HCT 43.1 04/11/2021   MCV 93.1 04/11/2021   PLT 160 04/11/2021      Chemistry      Component Value Date/Time   NA 136 04/11/2021 1416   NA 136 04/04/2017 1141   K 4.5 04/11/2021 1416   K 4.4 04/04/2017 1141   CL 102 04/11/2021 1416   CO2 28 04/11/2021 1416   CO2 24 04/04/2017 1141   BUN 17 04/11/2021 1416   BUN 21.1 04/04/2017 1141   CREATININE 0.74 04/11/2021 1416   CREATININE 1.1 04/04/2017 1141      Component Value Date/Time   CALCIUM 9.3 04/11/2021 1416   CALCIUM 9.1 04/04/2017 1141   ALKPHOS 90 04/11/2021 1416   ALKPHOS 89 04/04/2017 1141   AST 17 04/11/2021 1416   AST 17 04/04/2017 1141   ALT 18 04/11/2021 1416   ALT 18 04/04/2017 1141   BILITOT 0.9 04/11/2021 1416   BILITOT 0.67 04/04/2017 1141        RADIOGRAPHIC STUDIES: CT Chest W Contrast  Result Date: 04/12/2021 CLINICAL DATA:  Non-small cell lung cancer restaging EXAM: CT CHEST, ABDOMEN, AND PELVIS WITH CONTRAST TECHNIQUE: Multidetector CT imaging of the chest, abdomen and pelvis was performed following the standard protocol during bolus administration of intravenous contrast. RADIATION DOSE REDUCTION: This exam was performed according to the departmental dose-optimization program which includes automated exposure control, adjustment of the mA and/or kV according to patient size and/or use of iterative reconstruction technique. CONTRAST:  38m OMNIPAQUE IOHEXOL 350 MG/ML SOLN, additional oral enteric contrast COMPARISON:  01/10/2021 FINDINGS: CT CHEST FINDINGS Cardiovascular: Right chest port catheter. Aortic atherosclerosis. Cardiomegaly. Three-vessel coronary artery calcifications. Unchanged trace pericardial effusion. Enlargement of the main pulmonary artery, measuring up to 3.7 cm in caliber. Mediastinum/Nodes: Unchanged enlarged pretracheal, subcarinal, and right hilar lymph nodes, right hilar nodes measuring up to 2.0 x 1.7 cm (series 2, image 23). Atrophic or surgically absent thyroid. The trachea, and esophagus demonstrate no significant findings. Lungs/Pleura: Severe centrilobular emphysema. Unchanged post treatment appearance of the left lung, with perihilar consolidation and fibrosis centered in the superior segment left lower lobe. Most central, masslike component in the superior segment left lower lobe measures 5.5 x 4.7 cm, unchanged (series 4, image 68). No pleural effusion or pneumothorax. Musculoskeletal: No chest wall mass or suspicious osseous lesions identified. Chronic fracture of the lateral left seventh rib with nonunion (series 4, image 67). CT ABDOMEN PELVIS FINDINGS Hepatobiliary: No solid liver abnormality is seen. Numerous small gallstones in the gallbladder. No gallbladder wall thickening, or biliary dilatation.  Pancreas: Unremarkable. No pancreatic ductal dilatation or surrounding inflammatory changes. Spleen: Normal in size without significant abnormality. Adrenals/Urinary Tract: Stable, benign small medial limb right adrenal adenoma (series 2, image 58). Small bilateral renal cysts. Kidneys are otherwise normal, without renal calculi, solid lesion, or hydronephrosis. Bladder is unremarkable. Stomach/Bowel: Stomach is within normal limits. Appendix appears normal. No evidence of bowel wall thickening, distention, or inflammatory changes. Generally large burden of stool and stool balls throughout the colon and rectum. Descending and sigmoid diverticula without evidence of acute diverticulitis. Vascular/Lymphatic: Aortic atherosclerosis. No enlarged abdominal or pelvic lymph nodes. Reproductive: Mild prostatomegaly with TURP defect. Other: Small, fat containing bilateral inguinal hernias. No ascites. Musculoskeletal: No acute osseous findings. Intramedullary rod fixation of the proximal left femur. IMPRESSION: 1. Unchanged post treatment appearance of the left lung, with perihilar consolidation and fibrosis centered in the superior segment left lower lobe. 2. Unchanged enlarged mediastinal and right hilar lymph nodes. 3. No evidence of metastatic disease in the abdomen or pelvis. 4. Severe emphysema. 5. Cardiomegaly and coronary artery disease. 6. Enlargement of the main pulmonary artery, as can be seen in pulmonary hypertension. 7. Cholelithiasis. 8. Prostatomegaly. Aortic Atherosclerosis (ICD10-I70.0) and Emphysema (ICD10-J43.9). Electronically Signed   By: Delanna Ahmadi M.D.   On: 04/12/2021 07:46   CT Abdomen Pelvis W Contrast  Result Date: 04/12/2021 CLINICAL DATA:  Non-small cell lung cancer restaging EXAM: CT CHEST, ABDOMEN, AND PELVIS WITH CONTRAST TECHNIQUE: Multidetector CT imaging of the chest, abdomen and pelvis was performed following the standard protocol during bolus administration of intravenous  contrast. RADIATION DOSE REDUCTION: This exam was performed according to the departmental dose-optimization program which includes automated exposure control, adjustment of the mA and/or kV according to patient size and/or use of iterative reconstruction technique. CONTRAST:  79m OMNIPAQUE IOHEXOL 350 MG/ML SOLN, additional oral enteric contrast COMPARISON:  01/10/2021 FINDINGS: CT CHEST FINDINGS Cardiovascular: Right chest port catheter. Aortic atherosclerosis. Cardiomegaly. Three-vessel coronary artery calcifications. Unchanged trace pericardial effusion. Enlargement of the main pulmonary artery, measuring up to 3.7 cm in caliber. Mediastinum/Nodes: Unchanged enlarged pretracheal, subcarinal, and right hilar lymph nodes, right hilar nodes measuring up to 2.0 x 1.7 cm (series 2, image 23). Atrophic or surgically absent thyroid. The trachea, and esophagus demonstrate no significant findings. Lungs/Pleura: Severe centrilobular emphysema. Unchanged post treatment appearance of the left lung, with perihilar consolidation and fibrosis centered in the superior segment left lower lobe. Most central, masslike component in the superior segment left lower lobe measures 5.5 x 4.7 cm, unchanged (  series 4, image 68). No pleural effusion or pneumothorax. Musculoskeletal: No chest wall mass or suspicious osseous lesions identified. Chronic fracture of the lateral left seventh rib with nonunion (series 4, image 67). CT ABDOMEN PELVIS FINDINGS Hepatobiliary: No solid liver abnormality is seen. Numerous small gallstones in the gallbladder. No gallbladder wall thickening, or biliary dilatation. Pancreas: Unremarkable. No pancreatic ductal dilatation or surrounding inflammatory changes. Spleen: Normal in size without significant abnormality. Adrenals/Urinary Tract: Stable, benign small medial limb right adrenal adenoma (series 2, image 58). Small bilateral renal cysts. Kidneys are otherwise normal, without renal calculi, solid  lesion, or hydronephrosis. Bladder is unremarkable. Stomach/Bowel: Stomach is within normal limits. Appendix appears normal. No evidence of bowel wall thickening, distention, or inflammatory changes. Generally large burden of stool and stool balls throughout the colon and rectum. Descending and sigmoid diverticula without evidence of acute diverticulitis. Vascular/Lymphatic: Aortic atherosclerosis. No enlarged abdominal or pelvic lymph nodes. Reproductive: Mild prostatomegaly with TURP defect. Other: Small, fat containing bilateral inguinal hernias. No ascites. Musculoskeletal: No acute osseous findings. Intramedullary rod fixation of the proximal left femur. IMPRESSION: 1. Unchanged post treatment appearance of the left lung, with perihilar consolidation and fibrosis centered in the superior segment left lower lobe. 2. Unchanged enlarged mediastinal and right hilar lymph nodes. 3. No evidence of metastatic disease in the abdomen or pelvis. 4. Severe emphysema. 5. Cardiomegaly and coronary artery disease. 6. Enlargement of the main pulmonary artery, as can be seen in pulmonary hypertension. 7. Cholelithiasis. 8. Prostatomegaly. Aortic Atherosclerosis (ICD10-I70.0) and Emphysema (ICD10-J43.9). Electronically Signed   By: Delanna Ahmadi M.D.   On: 04/12/2021 07:46     ASSESSMENT AND PLAN: This is a 71 years old white male with metastatic non-small cell lung cancer, adenocarcinoma with no actionable mutations and PDL 1 expression of 5% that was initially diagnosed as stage IIIa non-small cell lung cancer, adenocarcinoma status post left lower lobectomy with lymph node dissection followed by a course of concurrent chemoradiation completed in January 2016.  The patient had evidence for disease metastasis in October 2018 with metastatic disease to the left femur as well as left supraclavicular and right paratracheal lymph nodes. The patient is currently on systemic chemotherapy initially was with carboplatin, Alimta  and Keytruda.  Carboplatin was discontinued secondary to hypersensitivity reaction starting from cycle #2.   He was also treated with 3 cycles of maintenance Alimta and Ketruda (pembrolizumab) but Alimta was discontinued secondary to intolerance. He is currently undergoing treatment with maintenance Keytruda as a single agent status post 49 cycles.  The patient has been tolerating his treatment fairly well with no concerning complaints but the patient continues to have a lot of issues with his arthritis especially in the knees and ankles.  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 His last scan showed no concerning findings for disease progression but there was new 8 mm nodule in the left lung that need close monitoring on the upcoming imaging studies.  The patient has been on observation since January 2022. The patient is feeling well today with no concerning complaints. He had repeat CT scan of the chest, abdomen pelvis performed recently.  I personally and independently reviewed the scans and discussed the results with the patient and his wife. His scan showed no concerning findings for disease progression. I recommended for him to continue on observation with repeat CT scan of the chest, abdomen pelvis and 4 months. For the hypothyroidism, he will continue his routine follow-up visit and evaluation by his  primary care physician. The patient was advised to call immediately if he has any other concerning symptoms in the interval.  The patient voices understanding of current disease status and treatment options and is in agreement with the current care plan. All questions were answered. The patient knows to call the clinic with any problems, questions or concerns. We can certainly see the patient much sooner if necessary.  Disclaimer: This note was dictated with voice recognition software. Similar sounding words can inadvertently be transcribed and may not be corrected  upon review.

## 2021-04-24 ENCOUNTER — Telehealth: Payer: Self-pay | Admitting: Gastroenterology

## 2021-04-24 MED ORDER — SUCRALFATE 1 G PO TABS: ORAL_TABLET | ORAL | 1 refills | Status: DC

## 2021-04-24 NOTE — Telephone Encounter (Signed)
Pharmacy called and stated that patient wanted his Carafate sent to their pharmacy. Please advise.

## 2021-04-24 NOTE — Telephone Encounter (Signed)
Refill for Carafate sent to [facility].

## 2021-05-05 DIAGNOSIS — H0288A Meibomian gland dysfunction right eye, upper and lower eyelids: Secondary | ICD-10-CM | POA: Diagnosis not present

## 2021-05-05 DIAGNOSIS — H16223 Keratoconjunctivitis sicca, not specified as Sjogren's, bilateral: Secondary | ICD-10-CM | POA: Diagnosis not present

## 2021-05-05 DIAGNOSIS — H0288B Meibomian gland dysfunction left eye, upper and lower eyelids: Secondary | ICD-10-CM | POA: Diagnosis not present

## 2021-05-15 ENCOUNTER — Other Ambulatory Visit: Payer: Medicare HMO

## 2021-05-29 ENCOUNTER — Telehealth: Payer: Self-pay | Admitting: Family Medicine

## 2021-05-29 NOTE — Chronic Care Management (AMB) (Signed)
°  Chronic Care Management   Outreach Note  05/29/2021 Name: Victor Huber MRN: [id number] DOB: 10-Oct-1950  Referred by: [name], MD Reason for referral : No chief complaint on file.   An unsuccessful telephone outreach was attempted today. The patient was referred to the pharmacist for assistance with care management and care coordination.    Follow Up Plan:  NO VM SET UP TO RESCHEDULE APT WITH CPP  Tatjana Dellinger Upstream Scheduler

## 2021-06-05 ENCOUNTER — Other Ambulatory Visit: Payer: Self-pay | Admitting: Gastroenterology

## 2021-06-08 ENCOUNTER — Inpatient Hospital Stay: Payer: Medicare HMO

## 2021-06-19 ENCOUNTER — Other Ambulatory Visit: Payer: Self-pay | Admitting: Family Medicine

## 2021-06-19 DIAGNOSIS — E785 Hyperlipidemia, unspecified: Secondary | ICD-10-CM

## 2021-06-23 ENCOUNTER — Telehealth: Payer: Self-pay | Admitting: Family Medicine

## 2021-06-23 NOTE — Progress Notes (Signed)
?  Chronic Care Management  ? ?Outreach Note ? ?06/23/2021 ?Name: Victor Huber MRN: [id number] DOB: 09-08-1950 ? ?Referred by: [name], MD ?Reason for referral : No chief complaint on file. ? ? ?A second unsuccessful telephone outreach was attempted today. The patient was referred to pharmacist for assistance with care management and care coordination. ? ?Follow Up Plan: PER SPOUSE PT WANTS TO SEE PCP FIRST ? ?Victor Huber ?Upstream Scheduler  ?

## 2021-06-26 ENCOUNTER — Other Ambulatory Visit: Payer: Medicare HMO

## 2021-07-02 ENCOUNTER — Encounter: Payer: Self-pay | Admitting: Internal Medicine

## 2021-07-10 IMAGING — DX DG FOOT COMPLETE 3+V*R*
3 series · 3 of 3 positions shown · non-contrast
Comparison: None.

CLINICAL DATA: Foot pain

EXAM:
RIGHT FOOT COMPLETE - 3+ VIEW

[foot ap]
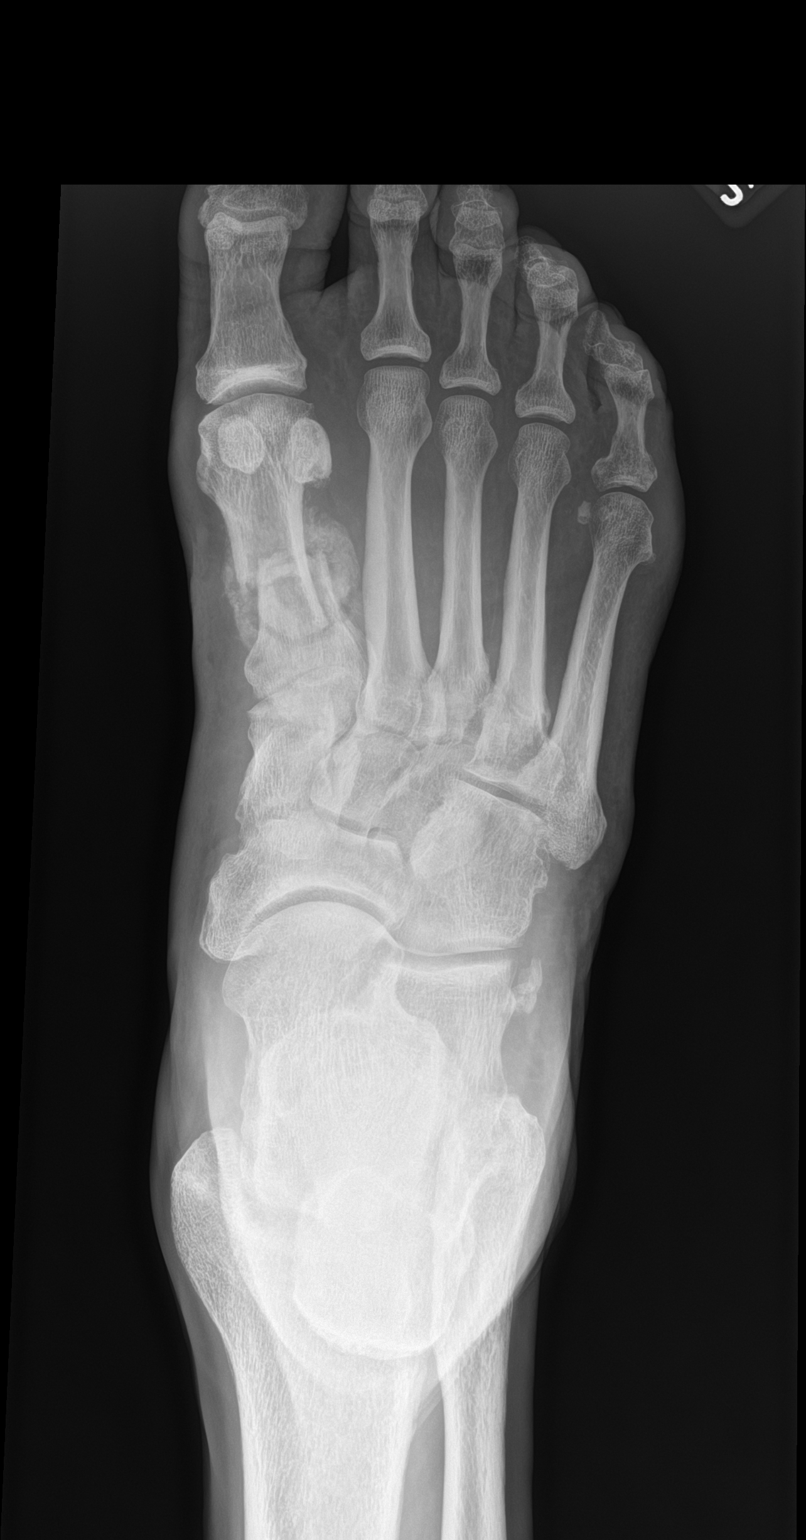

[foot obl]
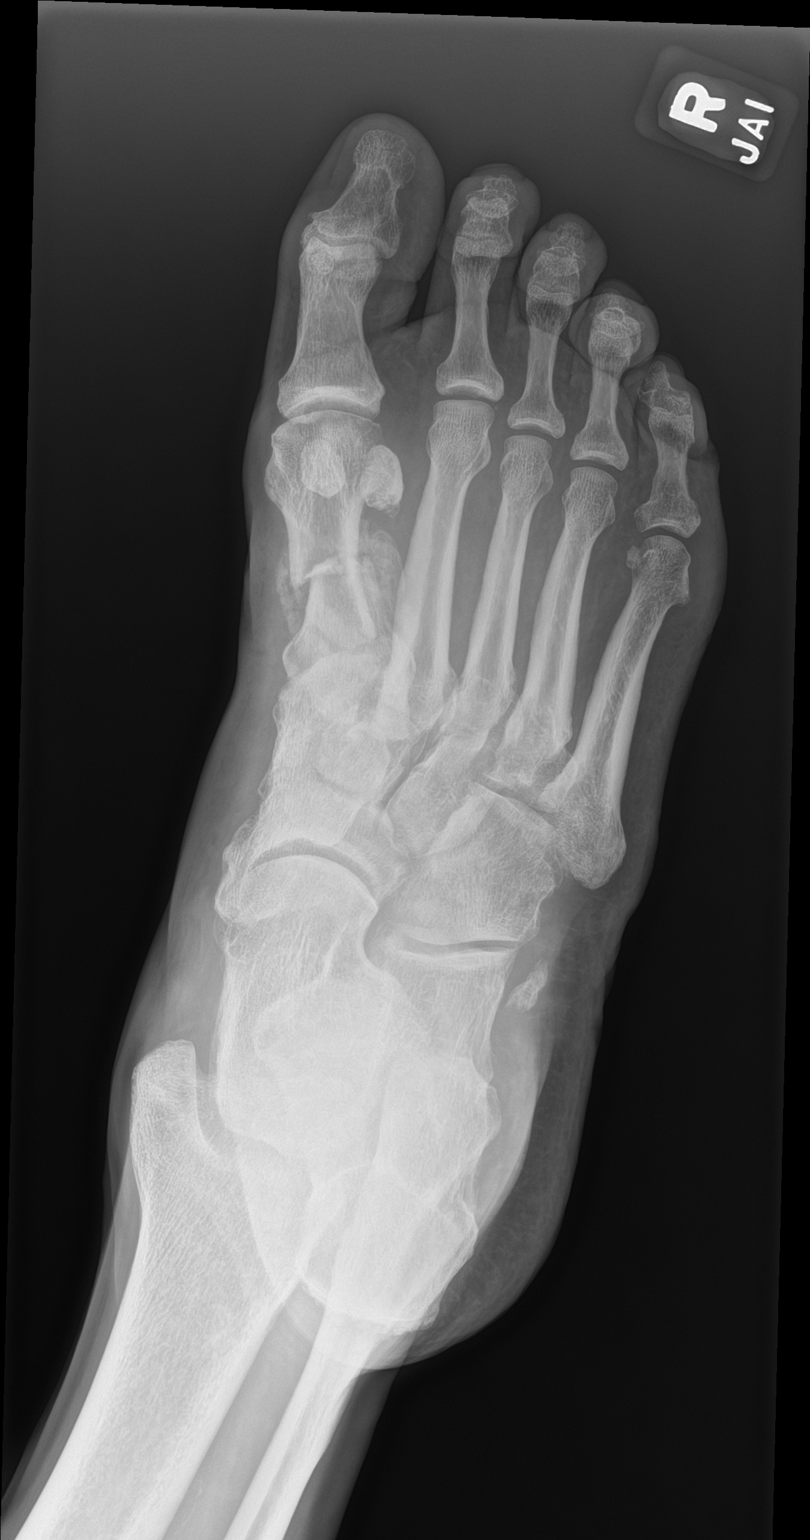

[foot lat]
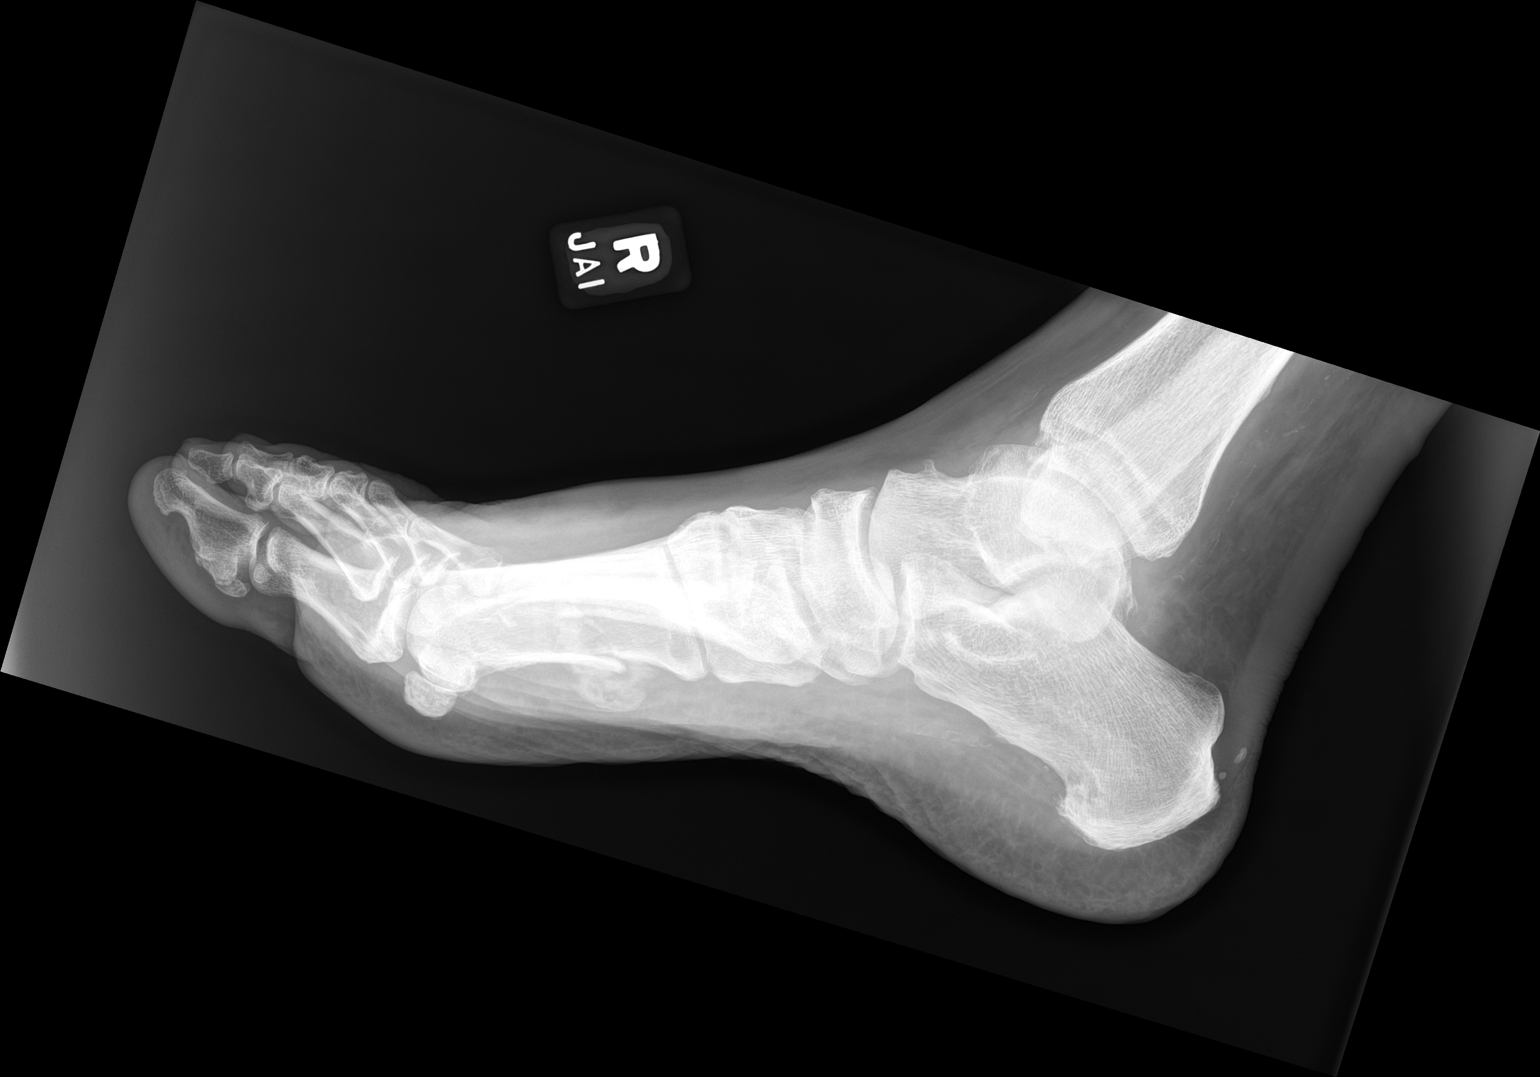

[3 of 3 positions shown; findings below may reference images not displayed]

FINDINGS: No subluxation. Fracture deformity involving the mi mid and proximal
shaft of the first metatarsal with about [DATE] shaft diameter medial
displacement of distal fracture fragment. Callus formation about the
fracture consistent with subacute process but fracture is
incompletely healed.
IMPRESSION: Subacute appearing fracture deformity involving the first metatarsal

## 2021-07-18 ENCOUNTER — Other Ambulatory Visit: Payer: Self-pay | Admitting: Gastroenterology

## 2021-08-09 ENCOUNTER — Telehealth: Payer: Self-pay | Admitting: Gastroenterology

## 2021-08-09 NOTE — Telephone Encounter (Signed)
Hi Victor Huber. Pt's wife Lelon Frohlich came to the office to drop off forms for pt's [employer]. I will bring them to you shortly. Thank you. ?

## 2021-08-11 ENCOUNTER — Inpatient Hospital Stay: Payer: Medicare HMO | Attending: Internal Medicine

## 2021-08-11 ENCOUNTER — Other Ambulatory Visit: Payer: Self-pay

## 2021-08-11 ENCOUNTER — Ambulatory Visit (HOSPITAL_COMMUNITY)
Admission: RE | Admit: 2021-08-11 | Discharge: 2021-08-11 | Disposition: A | Discharge status: Home / Self Care | Payer: Medicare HMO | Source: Ambulatory Visit | Attending: Internal Medicine | Admitting: Internal Medicine

## 2021-08-11 DIAGNOSIS — K8050 Calculus of bile duct without cholangitis or cholecystitis without obstruction: Secondary | ICD-10-CM | POA: Diagnosis not present

## 2021-08-11 DIAGNOSIS — S2232XK Fracture of one rib, left side, subsequent encounter for fracture with nonunion: Secondary | ICD-10-CM | POA: Diagnosis not present

## 2021-08-11 DIAGNOSIS — C7951 Secondary malignant neoplasm of bone: Secondary | ICD-10-CM | POA: Insufficient documentation

## 2021-08-11 DIAGNOSIS — Z923 Personal history of irradiation: Secondary | ICD-10-CM | POA: Insufficient documentation

## 2021-08-11 DIAGNOSIS — C3490 Malignant neoplasm of unspecified part of unspecified bronchus or lung: Secondary | ICD-10-CM | POA: Insufficient documentation

## 2021-08-11 DIAGNOSIS — Z95828 Presence of other vascular implants and grafts: Secondary | ICD-10-CM

## 2021-08-11 DIAGNOSIS — C3432 Malignant neoplasm of lower lobe, left bronchus or lung: Secondary | ICD-10-CM | POA: Insufficient documentation

## 2021-08-11 DIAGNOSIS — E039 Hypothyroidism, unspecified: Secondary | ICD-10-CM | POA: Insufficient documentation

## 2021-08-11 DIAGNOSIS — N281 Cyst of kidney, acquired: Secondary | ICD-10-CM | POA: Diagnosis not present

## 2021-08-11 DIAGNOSIS — K8020 Calculus of gallbladder without cholecystitis without obstruction: Secondary | ICD-10-CM | POA: Diagnosis not present

## 2021-08-11 DIAGNOSIS — Z9221 Personal history of antineoplastic chemotherapy: Secondary | ICD-10-CM | POA: Insufficient documentation

## 2021-08-11 DIAGNOSIS — Z79899 Other long term (current) drug therapy: Secondary | ICD-10-CM | POA: Insufficient documentation

## 2021-08-11 LAB — CMP (CANCER CENTER ONLY)
ALT: 18 U/L (ref 0–44)
AST: 19 U/L (ref 15–41)
Albumin: 4.1 g/dL (ref 3.5–5.0)
Alkaline Phosphatase: 88 U/L (ref 38–126)
Anion gap: 5 (ref 5–15)
BUN: 15 mg/dL (ref 8–23)
CO2: 28 mmol/L (ref 22–32)
Calcium: 9.1 mg/dL (ref 8.9–10.3)
Chloride: 105 mmol/L (ref 98–111)
Creatinine: 0.84 mg/dL (ref 0.61–1.24)
GFR, Estimated: >60 mL/min (ref >60)
Glucose, Bld: 158 mg/dL — ABNORMAL HIGH (ref 70–99)
Potassium: 4.5 mmol/L (ref 3.5–5.1)
Sodium: 138 mmol/L (ref 135–145)
Total Bilirubin: 0.8 mg/dL (ref 0.3–1.2)
Total Protein: 6.7 g/dL (ref 6.5–8.1)

## 2021-08-11 LAB — CBC WITH DIFFERENTIAL (CANCER CENTER ONLY)
Abs Immature Granulocytes: 0.02 10*3/uL (ref 0.00–0.07)
Basophils Absolute: 0 10*3/uL (ref 0.0–0.1)
Basophils Relative: 0 %
Eosinophils Absolute: 0.1 10*3/uL (ref 0.0–0.5)
Eosinophils Relative: 1 %
HCT: 43.5 % (ref 39.0–52.0)
Hemoglobin: 15.6 g/dL (ref 13.0–17.0)
Immature Granulocytes: 0 %
Lymphocytes Relative: 24 %
Lymphs Abs: 1.9 10*3/uL (ref 0.7–4.0)
MCH: 33.5 pg (ref 26.0–34.0)
MCHC: 35.9 g/dL (ref 30.0–36.0)
MCV: 93.3 fL (ref 80.0–100.0)
Monocytes Absolute: 0.5 10*3/uL (ref 0.1–1.0)
Monocytes Relative: 7 %
Neutro Abs: 5.3 10*3/uL (ref 1.7–7.7)
Neutrophils Relative %: 68 %
Platelet Count: 173 10*3/uL (ref 150–400)
RBC: 4.66 MIL/uL (ref 4.22–5.81)
RDW: 13.2 % (ref 11.5–15.5)
WBC Count: 7.8 10*3/uL (ref 4.0–10.5)
nRBC: 0 % (ref 0.0–0.2)

## 2021-08-11 MED ORDER — HEPARIN SOD (PORK) LOCK FLUSH 100 UNIT/ML IV SOLN
500.0000 [IU] | Freq: Once | INTRAVENOUS | Status: AC
  Administered 2021-08-11: 500 [IU] via INTRAVENOUS

## 2021-08-11 MED ORDER — SODIUM CHLORIDE (PF) 0.9 % IJ SOLN
INTRAMUSCULAR | Status: AC
  Filled 2021-08-11: qty 50

## 2021-08-11 MED ORDER — IOHEXOL 300 MG/ML  SOLN
100.0000 mL | Freq: Once | INTRAMUSCULAR | Status: AC | PRN
  Administered 2021-08-11: 100 mL via INTRAVENOUS

## 2021-08-11 MED ORDER — IOHEXOL 9 MG/ML PO SOLN
500.0000 mL | ORAL | Status: AC
  Administered 2021-08-11 (×2): 500 mL via ORAL

## 2021-08-11 MED ORDER — HEPARIN SOD (PORK) LOCK FLUSH 100 UNIT/ML IV SOLN
INTRAVENOUS | Status: AC
  Filled 2021-08-11: qty 5

## 2021-08-11 MED ORDER — SODIUM CHLORIDE 0.9% FLUSH
10.0000 mL | INTRAVENOUS | Status: DC | PRN
  Administered 2021-08-11: 10 mL

## 2021-08-14 ENCOUNTER — Telehealth: Payer: Self-pay | Admitting: Internal Medicine

## 2021-08-14 NOTE — Telephone Encounter (Signed)
Called patient regarding upcoming appointment, patient is notified. °

## 2021-08-15 ENCOUNTER — Inpatient Hospital Stay (HOSPITAL_BASED_OUTPATIENT_CLINIC_OR_DEPARTMENT_OTHER): Payer: Medicare HMO | Admitting: Internal Medicine

## 2021-08-15 ENCOUNTER — Other Ambulatory Visit: Payer: Self-pay

## 2021-08-15 VITALS — BP 127/81 | HR 82 | Temp 98.0°F | Resp 17 | Wt 229.4 lb

## 2021-08-15 DIAGNOSIS — E039 Hypothyroidism, unspecified: Secondary | ICD-10-CM | POA: Diagnosis not present

## 2021-08-15 DIAGNOSIS — C3432 Malignant neoplasm of lower lobe, left bronchus or lung: Secondary | ICD-10-CM | POA: Diagnosis not present

## 2021-08-15 DIAGNOSIS — C3490 Malignant neoplasm of unspecified part of unspecified bronchus or lung: Secondary | ICD-10-CM

## 2021-08-15 DIAGNOSIS — J449 Chronic obstructive pulmonary disease, unspecified: Secondary | ICD-10-CM | POA: Diagnosis not present

## 2021-08-15 DIAGNOSIS — Z923 Personal history of irradiation: Secondary | ICD-10-CM | POA: Diagnosis not present

## 2021-08-15 DIAGNOSIS — Z79899 Other long term (current) drug therapy: Secondary | ICD-10-CM | POA: Diagnosis not present

## 2021-08-15 DIAGNOSIS — C7951 Secondary malignant neoplasm of bone: Secondary | ICD-10-CM | POA: Diagnosis not present

## 2021-08-15 DIAGNOSIS — Z9221 Personal history of antineoplastic chemotherapy: Secondary | ICD-10-CM | POA: Diagnosis not present

## 2021-08-15 NOTE — Progress Notes (Signed)
?    [place] ?[phone]   [phone] ? ?OFFICE PROGRESS NOTE ? ?[name], MD ?[place] ?[place] [address] ? ?DIAGNOSIS: Metastatic non-small cell lung cancer initially diagnosed as stage IIIA (T2a, N2, M0) non-small cell lung cancer, poorly differentiated adenocarcinoma presented with left lower lobe lung mass in addition to mediastinal lymphadenopathy.  The patient was diagnosed with metastatic disease involving the left femur as well as left supraclavicular nodal metastases and right paratracheal lymphadenopathy in October 2018. ? ?Biomarker Findings ?Microsatellite Status - MS-Stable ?Tumor Mutational Burden - TMB-Low (3 Muts/Mb) ?Genomic Findings ?For a complete list of the genes assayed, please refer to the Appendix. ?STK11 P284f*6 ?CVCBS4HpQ75FFMsplice site 1384+6K>Z?DAXX EL935 ?MLL2 [id number] ?NBN K23361f ?NOTCH2 R1410H ?PMS2 splice site [id number]>L Disease relevant genes with no reportable alterations: EGFR, KRAS, ?ALK, BRAF, MET, RET, ERBB2, ROS1  ? ?PDL1 expression 5% ? ?PRIOR THERAPY: ?1) status post wedge resection of the left lower lobe lung mass as well as AP window lymph node dissection but there was residual metastatic mediastinal lymphadenopathy that could not be resected. ?2) a course of concurrent chemoradiation [place] carboplatin [place]  ?3) status post palliative radiotherapy to the left femur metastatic bone disease. ?4)  Systemic chemotherapy with carboplatin for AUC of 5, Alimta 500 mg/M2 and Keytruda 200 mg IV every 3 weeks.  First dose March 07, 2017.  Carboplatin was discontinued during cycle #2 secondary to hypersensitivity reaction. ?5) status post 2 cycles of maintenance treatment with Alimta and Ketruda (pembrolizumab).  Alimta was discontinued secondary to intolerance. ? ?CURRENT THERAPY: Maintenance treatment with single agent Ketruda (pembrolizumab) status post 49  cycles.   His treatment has been on hold since January 2022 secondary to intolerance. ? ?INTERVAL HISTORY: ?MicJUSTIS Huber 17o. male returns to the clinic today for follow-up visit accompanied by his wife.  The patient is feeling fine today with no concerning complaints except for fatigue and shortness of breath with exertion.  He is a little bit more depressed because of lack of energy secondary to his shortness of breath from COPD that is getting worse recently.  He is followed by Dr. ByrLamonte Sakait has not seen him for a while.  He also has a right ear infection for few days and he will see his primary care physician for evaluation of this problem.  For depression he eats THC that helps him mentally.  He is currently on Effexor Exar 37.5 mg by his primary care physician.  He denied having any current chest pain, cough or hemoptysis.  He has no nausea, vomiting, abdominal pain, diarrhea or constipation.  He has no recent weight loss or night sweats.  He has no headache or visual changes.  The patient had repeat CT scan of the chest, abdomen pelvis performed recently and is here for evaluation and discussion of his scan results and recommendation regarding his condition. ? ?MEDICAL HISTORY: ?Past Medical History:  ?Diagnosis Date  ? Adenocarcinoma of left lung, stage 3 ([facility]/16/2018  ? Allergy   ? Anxiety   ? Arthritis   ? Atrial fibrillation ([facility]  ? [place]rial fibrillation ([facility] ? [facility] ? lung  ? Cataract   ? Waiting to schedule bilateral cataract surgery  ? COPD (chronic obstructive pulmonary disease) ([place]/16/2018  ? Depression   ? Dysphagia   ? Dysrhythmia   ? a-fib  ?  GERD (gastroesophageal reflux disease)   ? Heart murmur   ? atenlol for A Fib/Eliquis  ? History of chemotherapy   ? History of radiation therapy   ? Hyperlipidemia   ? Hypertension   ? Hypothyroid 08/15/2016  ? Longstanding persistent atrial fibrillation (HCC) 08/29/2016  ? Pathologic fracture   ? left femur   ? Pneumonitis   ? S/P TURP 08/15/2016  ? Wears glasses   ? Wears hearing aid in both ears   ? ? ?ALLERGIES:  is allergic to carboplatin, flecainide, and debrox [carbamide peroxide]. ? ?MEDICATIONS:  ?Current Outpatient Medications  ?Medication Sig Dispense Refill  ? acetaminophen (TYLENOL) 500 MG tablet Take 500 mg by mouth every 8 (eight) hours as needed for moderate pain.    ? albuterol (VENTOLIN HFA) 108 (90 Base) MCG/ACT inhaler INHALE 1 TO 2 PUFFS EVERY 4 HOURS AS NEEDED FOR WHEEZING  OR FOR SHORTNESS OF BREATH 18 g 3  ? apixaban (ELIQUIS) 5 MG TABS tablet Take 1 tablet (5 mg total) by mouth 2 (two) times daily. 180 tablet 1  ? atenolol (TENORMIN) 50 MG tablet Take 1 tablet (50 mg total) by mouth daily. 90 tablet 3  ? atorvastatin (LIPITOR) 40 MG tablet TAKE 1 TABLET EVERY DAY 90 tablet 0  ? esomeprazole (NEXIUM) 40 MG capsule TAKE 1 CAPSULE (40 MG TOTAL) BY MOUTH 2 (TWO) TIMES DAILY BEFORE A MEAL. 180 capsule 3  ? gabapentin (NEURONTIN) 300 MG capsule Take 2 capsules (600 mg total) by mouth at bedtime as needed. 180 capsule 3  ? ibuprofen (ADVIL,MOTRIN) 400 MG tablet Take 400 mg by mouth every 4 (four) hours as needed for moderate pain.    ? levothyroxine (SYNTHROID) 200 MCG tablet TAKE 1 TABLET ONE TIME DAILY BEFORE BREAKFAST 90 tablet 0  ? lipase/protease/amylase (CREON) 36000 UNITS CPEP capsule Take 4 capsules po during each meal. ?Take 1-2 capsules po during each snack.( up to 2 snacks daily). 480 capsule 3  ? Oxycodone HCl 10 MG TABS Take 1 tablet (10 mg total) by mouth daily as needed (pain). 30 tablet 0  ? sucralfate (CARAFATE) 1 g tablet TAKE 1 TABLET FOUR TIMES DAILY WITH MEALS AND AT BEDTIME 360 tablet 1  ? tamsulosin (FLOMAX) 0.4 MG CAPS capsule Take 1 capsule (0.4 mg total) by mouth daily. 90 capsule 4  ? venlafaxine XR (EFFEXOR-XR) 37.5 MG 24 hr capsule Take 1 capsule (37.5 mg total) by mouth 2 (two) times daily. 180 capsule 3  ? ?No current facility-administered medications for this visit.   ? ?Facility-Administered Medications Ordered in Other Visits  ?Medication Dose Route Frequency Provider Last Rate Last Admin  ? sodium chloride flush (NS) 0.9 % injection 10 mL  10 mL Intracatheter PRN [name], [name], MD   10 mL at 10/13/20 1028  ? ? ?SURGICAL HISTORY:  ?Past Surgical History:  ?Procedure Laterality Date  ? BIOPSY  12/21/2019  ? Procedure: BIOPSY;  Surgeon: [name], Gabriel Jr., MD;  Location: [facility];  Service: Gastroenterology;;  ? BIOPSY  05/23/2020  ? Procedure: BIOPSY;  Surgeon: [name], Gabriel Jr., MD;  Location: [facility];  Service: Gastroenterology;;  ? BRONCHOSCOPY  10/2014  ? CARDIAC CATHETERIZATION    ? 05/07/12  ? CARDIOVERSION    ? x2  ? COLONOSCOPY    ? several yrs  ? DG BIOPSY LUNG Left 10/2014  ? FNA - Adenocarcinoma   ? ESOPHAGOGASTRODUODENOSCOPY (EGD) WITH PROPOFOL N/A 12/21/2019  ? Procedure: ESOPHAGOGASTRODUODENOSCOPY (EGD) WITH PROPOFOL;  Surgeon: [name], Gabriel Jr., MD;    Location: [facility];  Service: Gastroenterology;  Laterality: N/A;  ? ESOPHAGOGASTRODUODENOSCOPY (EGD) WITH PROPOFOL N/A 05/23/2020  ? Procedure: ESOPHAGOGASTRODUODENOSCOPY (EGD) WITH PROPOFOL;  Surgeon: Rush Landmark Telford Nab., MD;  Location[facility];  Service: Gastroenterology;  Laterality: N/A;  ? ESOPHAGOGASTRODUODENOSCOPY (EGD) WITH PROPOFOL N/A 06/30/2020  ? Procedure: ESOPHAGOGASTRODUODENOSCOPY (EGD) WITH PROPOFOL;  Surgeon: Rush Landmark Telford Nab., MD;  Location: [place];  Service: Gastroenterology;  Laterality: N/A;  ultra slim scope avail  ? EUS N/A 12/21/2019  ? Procedure: UPPER ENDOSCOPIC ULTRASOUND (EUS) RADIAL;  Surgeon: Rush Landmark Telford Nab., MD;  Location: [place];  Service: Gastroenterology;  Laterality: N/A;  ? FEMUR IM NAIL Left 02/19/2017  ? FEMUR IM NAIL Left 02/19/2017  ? Procedure: INTRAMEDULLARY (IM) NAIL FEMORAL;  Surgeon: Marchia Bond, MD;  Location: [place];  Service: Orthopedics;  Laterality: Left;  ? IR FLUORO GUIDE PORT INSERTION RIGHT   07/03/2017  ? IR US GUIDE VASC ACCESS RIGHT  07/03/2017  ? LUNG CANCER SURGERY Left 12/2014  ? Wedge Resection   ? MULTIPLE TOOTH EXTRACTIONS    ? SAVORY DILATION N/A 05/23/2020  ? Procedure: SAVORY DILATION;  S

## 2021-08-18 NOTE — Telephone Encounter (Signed)
Dina Rich Form has been completed and faxed back to Applications department. Wife has been informed.

## 2021-08-29 NOTE — Telephone Encounter (Signed)
Recd notice from [facility] Patient assistance that patient has been approved for Creon assistance through 04/01/2022. Shipment of Creon will be mailed to patient in 3-5 business days. Pt's wife has been informed. Letter has been sent to scanning.

## 2021-09-02 ENCOUNTER — Other Ambulatory Visit: Payer: Self-pay | Admitting: Nurse Practitioner

## 2021-09-05 ENCOUNTER — Encounter: Payer: Self-pay | Admitting: Family Medicine

## 2021-09-05 ENCOUNTER — Ambulatory Visit (INDEPENDENT_AMBULATORY_CARE_PROVIDER_SITE_OTHER): Payer: Medicare HMO | Admitting: Family Medicine

## 2021-09-05 VITALS — BP 101/69 | HR 66 | Temp 98.4°F | Ht 73.0 in | Wt 228.0 lb

## 2021-09-05 DIAGNOSIS — F321 Major depressive disorder, single episode, moderate: Secondary | ICD-10-CM | POA: Diagnosis not present

## 2021-09-05 DIAGNOSIS — E785 Hyperlipidemia, unspecified: Secondary | ICD-10-CM | POA: Diagnosis not present

## 2021-09-05 DIAGNOSIS — F419 Anxiety disorder, unspecified: Secondary | ICD-10-CM | POA: Diagnosis not present

## 2021-09-05 DIAGNOSIS — R739 Hyperglycemia, unspecified: Secondary | ICD-10-CM

## 2021-09-05 DIAGNOSIS — J441 Chronic obstructive pulmonary disease with (acute) exacerbation: Secondary | ICD-10-CM | POA: Diagnosis not present

## 2021-09-05 DIAGNOSIS — R202 Paresthesia of skin: Secondary | ICD-10-CM | POA: Diagnosis not present

## 2021-09-05 DIAGNOSIS — E032 Hypothyroidism due to medicaments and other exogenous substances: Secondary | ICD-10-CM | POA: Diagnosis not present

## 2021-09-05 MED ORDER — DULOXETINE HCL 60 MG PO CPEP: 60.0000 mg | ORAL_CAPSULE | Freq: Every day | ORAL | 3 refills | Status: DC

## 2021-09-05 MED ORDER — DIAZEPAM 5 MG PO TABS: 5.0000 mg | ORAL_TABLET | Freq: Two times a day (BID) | ORAL | 1 refills | Status: DC | PRN

## 2021-09-05 MED ORDER — CIPROFLOXACIN-DEXAMETHASONE 0.3-0.1 % OT SUSP: 4.0000 [drp] | Freq: Two times a day (BID) | OTIC | 0 refills | Status: DC

## 2021-09-05 MED ORDER — GABAPENTIN 300 MG PO CAPS: 600.0000 mg | ORAL_CAPSULE | Freq: Every evening | ORAL | 3 refills | Status: DC | PRN

## 2021-09-05 NOTE — Patient Instructions (Signed)
It was very nice to see you today!  We will switch your Effexor to Cymbalta.  It is okay for you to switch immediately when you gets your Cymbalta.  During the transition between these medications we will also start Valium 5 mg twice daily as needed for anxiety.  This should not be a long-term medication however you can do this for a few weeks while we are waiting for the Cymbalta to take effect.  Please send me a message in a few weeks to let me know how you are doing with this transition.  You have an ear infection in both of your ears.  Please start the Ciprodex drops.  Please come back soon for your blood work.  Take care, Dr Jerline Pain  PLEASE NOTE:  If you had any lab tests please let us know if you have not heard back within a few days. You may see your results on mychart before we have a chance to review them but we will give you a call once they are reviewed by [place]. If we ordered any referrals today, please let us know if you have not heard from their office within the next week.   Please try these tips to maintain a healthy lifestyle:  Eat at least 3 REAL meals and 1-2 snacks per day.  Aim for no more than 5 hours between eating.  If you eat breakfast, please do so within one hour of getting up.   Each meal should contain half fruits/vegetables, one quarter protein, and one quarter carbs (no bigger than a computer mouse)  Cut down on sweet beverages. This includes juice, soda, and sweet tea.   Drink at least 1 glass of water with each meal and aim for at least 8 glasses per day  Exercise at least 150 minutes every week.

## 2021-09-05 NOTE — Assessment & Plan Note (Signed)
Continue management per pulmonology.

## 2021-09-05 NOTE — Progress Notes (Signed)
Victor Huber is a 71 y.o. male who presents today for an office visit.  Assessment/Plan:  New/Acute Problems: Otitis Externa No red flags.  We will start Ciprodex drops.  If insurance would not pay for this we will try acetic acid-ordered cortisone drops.  Chronic Problems Addressed Today: Depression, major, single episode, moderate ([place]) Symptoms are currently not controlled.  Had a lengthy discussion with patient regarding treatment for his depression.  No red flags for SI or HI.  He has not had success with several different SSRIs.  He is not sure if Effexor has been effective however he is currently on a low-dose.  We discussed potentially increasing his dose of Effexor versus switching to alternative SNRI.  He is worried about withdrawal effects of Effexor and would like to avoid high-dose if possible.  We will switch to Cymbalta 60 mg daily.  He will send me a message in a few weeks via MyChart.  If symptoms are still not adequately controlled will consider augmenting with atypical antipsychotic versus switching to alternative SNRI or other atypical antidepressant.  Anxiety disorder We will be switching from Effexor to Cymbalta as above.  If symptoms or not well controlled.  He has a lot of stress due to his own medical conditions as well as the health of his wife.  We will be starting Valium 5 mg twice daily as needed while he transitions to Cymbalta.  Discussed with patient this would not be a long-term medication.  He will follow-up with me in a couple of weeks via MyChart.  Dyslipidemia Check lipids with blood work.  Hypothyroid He is on Synthroid 200 mcg daily.  We will check TSH with blood work.  Chronic obstructive pulmonary disease ([place]) Continue management per pulmonology.  Paresthesia Likely due to sequela of chemotherapy so we will check a B12 on labs.  Also check A1c.  We will be starting Cymbalta as above which should help with his neuropathic pain symptoms as  well.  We will refill his gabapentin 600 mg daily.     Subjective:  HPI:  See A/P for status of chronic conditions.  His most pressing concern today is worsening mood.  We last saw him over a year ago.  He has been under a lot of stress due to his stage IV lung cancer diagnosis and chronic pancreatitis.  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  He has been on several SSRIs in the past to help manage his anxiety and depression including Paxil, Lexapro, Zoloft.  He has also been on Wellbutrin is currently on Effexor.  Did not feel like any of these were particularly effective.  No current SI or HI.  He is not interested in seeing a therapist at this point however may be more open to this in the future.   Is also been having bilateral ear pain for the last several weeks.  Initially located on the right however has progressed to involve the left.        Objective:  Physical Exam: BP 101/69   Pulse 66   Temp 98.4 F (36.9 C) (Temporal)   Ht 6\' 1"  (1.854 m)   Wt 228 lb (103.4 kg)   SpO2 96%   BMI 30.08 kg/m   Gen: No acute distress, resting comfortably HEENT: TMs clear bilaterally however bilateral EAC edematous with clear exudate bilaterally. CV: Irregular rate and rhythm with no murmurs appreciated Pulm: Normal work of breathing,  clear to auscultation bilaterally with no crackles, wheezes, or rhonchi Neuro: Grossly normal, moves all extremities Psych: Normal affect and thought content  Time Spent: 41 minutes of total time was spent on the date of the encounter performing the following actions: chart review prior to seeing the patient including recent specialist visits, obtaining history, performing a medically necessary exam, counseling on the treatment plan, placing orders, and documenting in our EHR.        Algis Greenhouse. Jerline Pain, MD 09/05/2021 2:41 PM

## 2021-09-05 NOTE — Assessment & Plan Note (Signed)
He is on Synthroid 200 mcg daily.  We will check TSH with blood work.

## 2021-09-05 NOTE — Assessment & Plan Note (Signed)
We will be switching from Effexor to Cymbalta as above.  If symptoms or not well controlled.  He has a lot of stress due to his own medical conditions as well as the health of his wife.  We will be starting Valium 5 mg twice daily as needed while he transitions to Cymbalta.  Discussed with patient this would not be a long-term medication.  He will follow-up with me in a couple of weeks via MyChart.

## 2021-09-05 NOTE — Assessment & Plan Note (Signed)
Likely due to sequela of chemotherapy so we will check a B12 on labs.  Also check A1c.  We will be starting Cymbalta as above which should help with his neuropathic pain symptoms as well.  We will refill his gabapentin 600 mg daily.

## 2021-09-05 NOTE — Assessment & Plan Note (Signed)
Check lipids with blood work.

## 2021-09-05 NOTE — Assessment & Plan Note (Signed)
Symptoms are currently not controlled.  Had a lengthy discussion with patient regarding treatment for his depression.  No red flags for SI or HI.  He has not had success with several different SSRIs.  He is not sure if Effexor has been effective however he is currently on a low-dose.  We discussed potentially increasing his dose of Effexor versus switching to alternative SNRI.  He is worried about withdrawal effects of Effexor and would like to avoid high-dose if possible.  We will switch to Cymbalta 60 mg daily.  He will send me a message in a few weeks via MyChart.  If symptoms are still not adequately controlled will consider augmenting with atypical antipsychotic versus switching to alternative SNRI or other atypical antidepressant.

## 2021-09-07 ENCOUNTER — Telehealth: Payer: Self-pay | Admitting: *Deleted

## 2021-09-07 ENCOUNTER — Ambulatory Visit: Payer: Medicare HMO

## 2021-09-07 NOTE — Telephone Encounter (Signed)
[employer] called - provided patients last name incorrectly- provided DOB as [birth year]-  Victor Huber has been made aware.

## 2021-09-07 NOTE — Telephone Encounter (Signed)
PA Case ID: [id number] - Rx #: [id number] Status Sent to Plan today Drug Ciprofloxacin-Dexamethasone 0.3-0.1% suspension Waiting for determination

## 2021-09-08 ENCOUNTER — Telehealth: Payer: Self-pay | Admitting: Family Medicine

## 2021-09-08 ENCOUNTER — Other Ambulatory Visit: Payer: Self-pay | Admitting: *Deleted

## 2021-09-08 MED ORDER — HYDROCORTISONE-ACETIC ACID 1-2 % OT SOLN: 4.0000 [drp] | Freq: Two times a day (BID) | OTIC | 0 refills | Status: DC

## 2021-09-08 NOTE — Telephone Encounter (Signed)
Error

## 2021-09-08 NOTE — Telephone Encounter (Signed)
Pt's spouse called in stating the ciprofloxacin-dexamethasone (CIPRODEX) OTIC suspension were not covered by their insurance. She would like to know if some different drops could be called in as soon as possible. They will be leaving for vacation on 09/11/21.

## 2021-09-08 NOTE — Telephone Encounter (Signed)
PA denied Appeal will take 72 hr Ref #[id number] Alternative Rx neomycin-polymyxin-hydrocort ear drops,suspension, ofloxacin ear drops AND neomycin-polymyxin-hydrocort ear solution Requesting alternative send to pharmacy

## 2021-09-08 NOTE — Telephone Encounter (Signed)
Rx send to pharmacy  

## 2021-09-18 ENCOUNTER — Ambulatory Visit: Payer: Medicare HMO

## 2021-09-18 ENCOUNTER — Ambulatory Visit: Payer: Medicare HMO | Admitting: Family Medicine

## 2021-10-04 ENCOUNTER — Other Ambulatory Visit (HOSPITAL_COMMUNITY): Payer: Self-pay | Admitting: Nurse Practitioner

## 2021-10-05 NOTE — Telephone Encounter (Signed)
Prescription refill request for Eliquis received. Indication: Atrial Fib Last office visit: 12/20/20  Maximino Greenland NP Scr: 0.84 on 08/11/21 Age: 71 Weight: 97.7kg  Based on above findings Eliquis 5mg  twice daily is the appropriate dose.  Refill approved.

## 2021-10-12 ENCOUNTER — Inpatient Hospital Stay: Payer: Medicare HMO

## 2021-10-23 ENCOUNTER — Other Ambulatory Visit: Payer: Self-pay

## 2021-10-24 ENCOUNTER — Encounter: Payer: Self-pay | Admitting: Family Medicine

## 2021-10-25 NOTE — Telephone Encounter (Signed)
FYI

## 2021-10-26 NOTE — Telephone Encounter (Signed)
I appreciate the update. I am glad he is doing well. We can continue his current meds and he should let us know if he runs into any other issues  Algis Greenhouse. Jerline Pain, MD 10/26/2021 12:58 PM

## 2021-11-15 ENCOUNTER — Encounter (HOSPITAL_BASED_OUTPATIENT_CLINIC_OR_DEPARTMENT_OTHER): Payer: Self-pay

## 2021-11-15 DIAGNOSIS — Z974 Presence of external hearing-aid: Secondary | ICD-10-CM | POA: Insufficient documentation

## 2021-11-15 DIAGNOSIS — Z973 Presence of spectacles and contact lenses: Secondary | ICD-10-CM | POA: Insufficient documentation

## 2021-11-15 DIAGNOSIS — R011 Cardiac murmur, unspecified: Secondary | ICD-10-CM | POA: Insufficient documentation

## 2021-11-15 DIAGNOSIS — Z9221 Personal history of antineoplastic chemotherapy: Secondary | ICD-10-CM | POA: Insufficient documentation

## 2021-11-15 DIAGNOSIS — K219 Gastro-esophageal reflux disease without esophagitis: Secondary | ICD-10-CM | POA: Insufficient documentation

## 2021-11-15 DIAGNOSIS — F32A Depression, unspecified: Secondary | ICD-10-CM | POA: Insufficient documentation

## 2021-11-15 DIAGNOSIS — T7840XA Allergy, unspecified, initial encounter: Secondary | ICD-10-CM | POA: Insufficient documentation

## 2021-11-15 DIAGNOSIS — I499 Cardiac arrhythmia, unspecified: Secondary | ICD-10-CM | POA: Insufficient documentation

## 2021-11-15 DIAGNOSIS — M8440XA Pathological fracture, unspecified site, initial encounter for fracture: Secondary | ICD-10-CM | POA: Insufficient documentation

## 2021-11-15 DIAGNOSIS — R1310 Dysphagia, unspecified: Secondary | ICD-10-CM | POA: Insufficient documentation

## 2021-11-15 DIAGNOSIS — Z923 Personal history of irradiation: Secondary | ICD-10-CM | POA: Insufficient documentation

## 2021-11-20 ENCOUNTER — Ambulatory Visit (HOSPITAL_BASED_OUTPATIENT_CLINIC_OR_DEPARTMENT_OTHER): Payer: Medicare HMO | Admitting: Cardiovascular Disease

## 2021-11-20 ENCOUNTER — Encounter (HOSPITAL_BASED_OUTPATIENT_CLINIC_OR_DEPARTMENT_OTHER): Payer: Self-pay | Admitting: Cardiovascular Disease

## 2021-11-20 VITALS — BP 110/64 | HR 115 | Ht 73.0 in | Wt 234.7 lb

## 2021-11-20 DIAGNOSIS — R0609 Other forms of dyspnea: Secondary | ICD-10-CM

## 2021-11-20 DIAGNOSIS — E039 Hypothyroidism, unspecified: Secondary | ICD-10-CM | POA: Diagnosis not present

## 2021-11-20 DIAGNOSIS — I482 Chronic atrial fibrillation, unspecified: Secondary | ICD-10-CM

## 2021-11-20 DIAGNOSIS — R7989 Other specified abnormal findings of blood chemistry: Secondary | ICD-10-CM | POA: Diagnosis not present

## 2021-11-20 DIAGNOSIS — I4819 Other persistent atrial fibrillation: Secondary | ICD-10-CM

## 2021-11-20 MED ORDER — METOPROLOL SUCCINATE ER 50 MG PO TB24: 50.0000 mg | ORAL_TABLET | Freq: Every day | ORAL | 3 refills | Status: DC

## 2021-11-20 NOTE — Progress Notes (Signed)
Cardiology Office Note   Date:  11/20/2021   ID:  Victor Huber, DOB 01-11-1951, MRN [id number]  PCP:  [name], MD  Cardiologist:   Skeet Latch, MD   No chief complaint on file.    History of Present Illness: Victor Huber is a 71 y.o. male with persistent atrial fibrillation, COPD, hypothyroidism and metastatic non-small cell lung cancer s/p resection and on chemotherapy and prior tobacco abuse here for follow up.  He moved from [place] 05/2016 and established care with Dr. Julien Nordmann.  He was diagnosed with atrial fibrillation in 2012.  He has been chronically in atrial fibrillation lately.  In the past he was on amiodarone. However this was discontinued when he was diagnosed with lung cancer in 2016. After that he was switched to sotalol but failed subsequent DCCV.  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  Victor Huber reported fatigue and shortness of breath. He was switched from metoprolol to atenolol, given that he previously felt better on this medication.  He was also referred to the atrial fibrillation clinic where he discussed using dofetilide, amiodarone, and ablation. He was not felt to be a good candidate for ablation declined antiarrhythmics due to cost.  Victor Huber continues with [facility] therapy.  This was stopped for a while due to pancreatitis. He reported exertional dyspnea and fatigue. He had a nuclear stress test 03/2020 which revealed multiple artifacts but no ischemia. We discussed antiarrythmic options and felt dofetilide was best, but he declined due to the need for hospitalization. He was seen in Afib clinic 12/2020 after he was found to have Afib RDR after an endoscopy procedure. His atenolol had been held that morning. She recommended continuing his current therapy.   Today, he appears to be doing well. He states that he feels out of breath at times, and that he has become more aware of this ever since six months ago.  He reports that he has not had to use his inhaler. He does not participate in much formal exercise, but remains fairly active around his house. He believes his diet may contribute to some of his health problems. Of note, he mentions taking some THC on occasion to lower his stress levels. He denies any palpitations, chest pain, or peripheral edema. No lightheadedness, headaches, syncope, orthopnea, or PND.   Past Medical History:  Diagnosis Date   Adenocarcinoma of left lung, stage 3 ([place]) 08/15/2016   Allergy    Anxiety    Arthritis    Atrial fibrillation ([place]) 08/15/2016   Atrial fibrillation ([place])    Cancer, metastatic to bone [facility]    lung   Cataract    Waiting to schedule bilateral cataract surgery   COPD (chronic obstructive pulmonary disease) ([place]) 08/15/2016   Depression    Dysphagia    Dysrhythmia    a-fib   GERD (gastroesophageal reflux disease)    Heart murmur    atenlol for A Fib/Eliquis   History of chemotherapy    History of radiation therapy    Hyperlipidemia    Hypertension    Hypothyroid 08/15/2016   Longstanding persistent atrial fibrillation ([place]) 08/29/2016   Pathologic fracture    left femur   Pneumonitis    S/P TURP 08/15/2016   Wears glasses    Wears hearing aid in both ears     Past Surgical History:  Procedure Laterality Date   BIOPSY  12/21/2019   Procedure: BIOPSY;  Surgeon: Irving Copas.,  MD;  Location: [facility];  Service: Gastroenterology;;   BIOPSY  05/23/2020   Procedure: BIOPSY;  Surgeon: Irving Copas., MD;  Location: [facility];  Service: Gastroenterology;;   BRONCHOSCOPY  10/2014   CARDIAC CATHETERIZATION     05/07/12   CARDIOVERSION     x2   COLONOSCOPY     several yrs   DG BIOPSY LUNG Left 10/2014   FNA - Adenocarcinoma    ESOPHAGOGASTRODUODENOSCOPY (EGD) WITH PROPOFOL N/A 12/21/2019   Procedure: ESOPHAGOGASTRODUODENOSCOPY (EGD) WITH PROPOFOL;  Surgeon: Irving Copas., MD;  Location: [facility];   Service: Gastroenterology;  Laterality: N/A;   ESOPHAGOGASTRODUODENOSCOPY (EGD) WITH PROPOFOL N/A 05/23/2020   Procedure: ESOPHAGOGASTRODUODENOSCOPY (EGD) WITH PROPOFOL;  Surgeon: Rush Landmark Telford Nab., MD;  Location: [facility];  Service: Gastroenterology;  Laterality: N/A;   ESOPHAGOGASTRODUODENOSCOPY (EGD) WITH PROPOFOL N/A 06/30/2020   Procedure: ESOPHAGOGASTRODUODENOSCOPY (EGD) WITH PROPOFOL;  Surgeon: Rush Landmark Telford Nab., MD;  Location: [facility];  Service: Gastroenterology;  Laterality: N/A;  ultra slim scope avail   EUS N/A 12/21/2019   Procedure: UPPER ENDOSCOPIC ULTRASOUND (EUS) RADIAL;  Surgeon: Irving Copas., MD;  Location: [facility];  Service: Gastroenterology;  Laterality: N/A;   FEMUR IM NAIL Left 02/19/2017   FEMUR IM NAIL Left 02/19/2017   Procedure: INTRAMEDULLARY (IM) NAIL FEMORAL;  Surgeon: Marchia Bond, MD;  Location: [facility];  Service: Orthopedics;  Laterality: Left;   IR FLUORO GUIDE PORT INSERTION RIGHT  07/03/2017   IR US GUIDE VASC ACCESS RIGHT  07/03/2017   LUNG CANCER SURGERY Left 12/2014   [place] Resection    MULTIPLE TOOTH EXTRACTIONS     SAVORY DILATION N/A 05/23/2020   Procedure: SAVORY DILATION;  Surgeon: Irving Copas., MD;  Location: [facility];  Service: Gastroenterology;  Laterality: N/A;   SAVORY DILATION N/A 06/30/2020   Procedure: SAVORY DILATION;  Surgeon: Rush Landmark Telford Nab., MD;  Location: [facility];  Service: Gastroenterology;  Laterality: N/A;   Status post TURP     TONSILLECTOMY     UPPER GASTROINTESTINAL ENDOSCOPY       Current Outpatient Medications  Medication Sig Dispense Refill   acetaminophen (TYLENOL) 500 MG tablet Take 500 mg by mouth every 8 (eight) hours as needed for moderate pain.     albuterol (VENTOLIN HFA) 108 (90 Base) MCG/ACT inhaler INHALE 1 TO 2 PUFFS EVERY 4 HOURS AS NEEDED FOR WHEEZING  OR FOR SHORTNESS OF BREATH 18 g 3   atorvastatin (LIPITOR) 40 MG tablet TAKE 1 TABLET EVERY DAY  90 tablet 0   diazepam (VALIUM) 5 MG tablet Take 1 tablet (5 mg total) by mouth every 12 (twelve) hours as needed for anxiety. 30 tablet 1   DULoxetine (CYMBALTA) 60 MG capsule Take 1 capsule (60 mg total) by mouth daily. 30 capsule 3   ELIQUIS 5 MG TABS tablet TAKE 1 TABLET TWICE DAILY 180 tablet 1   esomeprazole (NEXIUM) 40 MG capsule TAKE 1 CAPSULE (40 MG TOTAL) BY MOUTH 2 (TWO) TIMES DAILY BEFORE A MEAL. 180 capsule 3   gabapentin (NEURONTIN) 300 MG capsule Take 2 capsules (600 mg total) by mouth at bedtime as needed. 180 capsule 3   ibuprofen (ADVIL,MOTRIN) 400 MG tablet Take 400 mg by mouth every 4 (four) hours as needed for moderate pain.     levothyroxine (SYNTHROID) 200 MCG tablet TAKE 1 TABLET ONE TIME DAILY BEFORE BREAKFAST 90 tablet 0   lipase/protease/amylase (CREON) 36000 UNITS CPEP capsule Take 4 capsules po during each meal. Take 1-2 capsules po during  each snack.( up to 2 snacks daily). 480 capsule 3   metoprolol succinate (TOPROL-XL) 50 MG 24 hr tablet Take 1 tablet (50 mg total) by mouth daily. Take with or immediately following a meal. 90 tablet 3   Oxycodone HCl 10 MG TABS Take 1 tablet (10 mg total) by mouth daily as needed (pain). 30 tablet 0   sucralfate (CARAFATE) 1 g tablet TAKE 1 TABLET FOUR TIMES DAILY WITH MEALS AND AT BEDTIME 360 tablet 1   tamsulosin (FLOMAX) 0.4 MG CAPS capsule Take 1 capsule (0.4 mg total) by mouth daily. 90 capsule 4   No current facility-administered medications for this visit.   Facility-Administered Medications Ordered in Other Visits  Medication Dose Route Frequency Provider Last Rate Last Admin   sodium chloride flush (NS) 0.9 % injection 10 mL  10 mL Intracatheter PRN Victor Bears, MD   10 mL at 10/13/20 1028    Allergies:   Carboplatin, Flecainide, and Debrox [carbamide peroxide]    Social History:  The patient  reports that he quit smoking about 9 years ago. His smoking use included cigarettes. He has a 50.00 pack-year smoking  history. He has never used smokeless tobacco. He reports that he does not drink alcohol and does not use drugs.   Family History:  The patient's family history includes Breast cancer in his maternal aunt, maternal aunt, and mother.    ROS:   Please see the history of present illness.  (+) Shortness of breath  (+) Stress All other systems are reviewed and negative.   PHYSICAL EXAM: VS:  BP 110/64 (BP Location: Right Arm, Patient Position: Sitting, Cuff Size: Large)   Pulse (!) 115   Ht 6\' 1"  (1.854 m)   Wt 234 lb 11.2 oz (106.5 kg)   BMI 30.96 kg/m  , BMI Body mass index is 30.96 kg/m. GENERAL:  Well-appearing.  No acute distress.  HEENT: Pupils equal round and reactive, fundi not visualized, oral mucosa unremarkable NECK:  No jugular venous distention, waveform within normal limits, carotid upstroke brisk and symmetric, no bruits, no thyromegaly LUNGS:  Clear to auscultation bilaterally HEART:  Tachycardic.  Irregularly irregular.  PMI not displaced or sustained,S1 and S2 within normal limits, no S3, no S4, no clicks, no rubs, no murmurs ABD:  Flat, positive bowel sounds normal in frequency in pitch, no bruits, no rebound, no guarding, no midline pulsatile mass, no hepatomegaly, no splenomegaly EXT:  2 plus pulses throughout, no edema, no cyanosis no clubbing SKIN:  No rashes no nodules NEURO:  Cranial nerves II through XII grossly intact, motor grossly intact throughout PSYCH:  Cognitively intact, oriented to person place and time   EKG: EKG is personally reviewed.   08/29/16: atrial fibrillation. Rate 92 bpm. PVCs. Right axis deviation. Incomplete RBBB. Nonspecific ST/T changes.  01/02/18: Atrial fibrillation.  Rate 95 bpm. 03/19/19: Atrial fibrillation.  Rate 104 bpm.  Incomplete RBBB.   03/18/2020: Atrial fibrillation.  Rate 83 bpm.  PVCs.  Nonspecific IVCD.  Nonspecific T wave abnormalities. 11/20/21: Atrial fibrillation. Rate 115 bpm. Incomplete RBBB.   Recent  Labs: 08/11/2021: ALT 18; BUN 15; Creatinine 0.84; Hemoglobin 15.6; Platelet Count 173; Potassium 4.5; Sodium 138    Lipid Panel    Component Value Date/Time   CHOL 122 03/06/2019 0956   CHOL 115 12/19/2016 0933   TRIG 164.0 (H) 09/10/2019 1640   HDL 35.90 (L) 03/06/2019 0956   HDL 33 (L) 12/19/2016 0933   CHOLHDL 3 03/06/2019 0956   VLDL 40.6 (  H) 03/06/2019 0956   LDLCALC 34 12/19/2016 0933   LDLDIRECT 55.0 03/06/2019 0956      Wt Readings from Last 3 Encounters:  11/20/21 234 lb 11.2 oz (106.5 kg)  09/05/21 228 lb (103.4 kg)  08/15/21 229 lb 7 oz (104.1 kg)      ASSESSMENT AND PLAN:  Chronic atrial fibrillation (HCC) He remains in atrial fibrillation.  Rates are uncontrolled.  We will switch his atenolol to metoprolol succinate 50 g daily.  He does not have any blood pressure room to further titrate his nodal agents at this time.  I have asked him to check his blood pressures and heart rates at home.  He has not had his TSH level checked recently.  We will get 1 today.  We will also check a CBC and CMP.  Continue Eliquis.  He seems very overwhelmed by the prospect of checking his blood pressure and heart rate.  Recommend that he keep working with his PCP on stress management.  He certainly has multiple medical issues that can be understandably overwhelming at times.  Repeat echo.  Dyspnea He has chronic shortness of breath that is multifactorial.  He is euvolemic on exam.  Now that he is in atrial fibrillation his rates are poorly controlled, this certainly is not helping.  He is also had worsening underlying COPD.  His metastatic lung adenocarcinoma has been stable on recent imaging.  Checking thyroid function and an echo as above.  No indication for repeat ischemic evaluation at this time.    Medication adjustments/Labs and Tests ordered: Current medicines are reviewed at length with the patient today.  The patient does not have concerns regarding medicines.  The following  changes have been made:  no change  Labs/ tests ordered today include:   Orders Placed This Encounter  Procedures   CBC with Differential/Platelet   TSH   Comprehensive metabolic panel   EKG 80-XKPV   ECHOCARDIOGRAM COMPLETE     Disposition: FU with Vicke Plotner C. Oval Linsey, MD, [facility] in 1 month.    I,Breanna Adamick,acting as a scribe for Skeet Latch, MD.,have documented all relevant documentation on the behalf of Skeet Latch, MD,as directed by  Skeet Latch, MD while in the presence of Skeet Latch, MD.   I, [place] Oval Linsey, MD have reviewed all documentation for this visit.  The documentation of the exam, diagnosis, procedures, and orders on 11/20/2021 are all accurate and complete.  Signed, Mikey Maffett C. Oval Linsey, MD, [facility]  11/20/2021 6:32 PM    [place]

## 2021-11-20 NOTE — Assessment & Plan Note (Addendum)
He remains in atrial fibrillation.  Rates are uncontrolled.  We will switch his atenolol to metoprolol succinate 50 g daily.  He does not have any blood pressure room to further titrate his nodal agents at this time.  I have asked him to check his blood pressures and heart rates at home.  He has not had his TSH level checked recently.  We will get 1 today.  We will also check a CBC and CMP.  Continue Eliquis.  He seems very overwhelmed by the prospect of checking his blood pressure and heart rate.  Recommend that he keep working with his PCP on stress management.  He certainly has multiple medical issues that can be understandably overwhelming at times.  Repeat echo.

## 2021-11-20 NOTE — Assessment & Plan Note (Signed)
He has chronic shortness of breath that is multifactorial.  He is euvolemic on exam.  Now that he is in atrial fibrillation his rates are poorly controlled, this certainly is not helping.  He is also had worsening underlying COPD.  His metastatic lung adenocarcinoma has been stable on recent imaging.  Checking thyroid function and an echo as above.  No indication for repeat ischemic evaluation at this time.

## 2021-11-20 NOTE — Patient Instructions (Addendum)
Medication Instructions:  STOP ATENOLOL   START METOPROLOL SUC 50 MG DAILY   *If you need a refill on your cardiac medications before your next appointment, please call your pharmacy*  Lab Work: CMET/CBC/TSH TODAY   If you have labs (blood work) drawn today and your tests are completely normal, you will receive your results only by: [place] (if you have MyChart) OR A paper copy in the mail If you have any lab test that is abnormal or we need to change your treatment, we will call you to review the results.  Testing/Procedures: Your physician has requested that you have an echocardiogram. Echocardiography is a painless test that uses sound waves to create images of your heart. It provides your doctor with information about the size and shape of your heart and how well your heart's chambers and valves are working. This procedure takes approximately one hour. There are no restrictions for this procedure.  Follow-Up: At [facility], you and your health needs are our priority.  As part of our continuing mission to provide you with exceptional heart care, we have created designated Provider Care Teams.  These Care Teams include your primary Cardiologist (physician) and Advanced Practice Providers (APPs -  Physician Assistants and Nurse Practitioners) who all work together to provide you with the care you need, when you need it.  We recommend signing up for the patient portal called "MyChart".  Sign up information is provided on this After Visit Summary.  MyChart is used to connect with patients for Virtual Visits (Telemedicine).  Patients are able to view lab/test results, encounter notes, upcoming appointments, etc.  Non-urgent messages can be sent to your provider as well.   To learn more about what you can do with MyChart, go to [email or web address].    Your next appointment:   4-5 week(s)  The format for your next appointment:   In Person  Provider:   Laurann Montana, NP    Other Instructions MONITOR AND LOG BLOOD PRESSURE/HEART RATE AT [place] [facility]

## 2021-11-21 ENCOUNTER — Other Ambulatory Visit: Payer: Self-pay | Admitting: Family Medicine

## 2021-11-21 ENCOUNTER — Encounter: Payer: Self-pay | Admitting: Family Medicine

## 2021-11-21 ENCOUNTER — Encounter: Payer: Self-pay | Admitting: Internal Medicine

## 2021-11-21 ENCOUNTER — Other Ambulatory Visit (HOSPITAL_BASED_OUTPATIENT_CLINIC_OR_DEPARTMENT_OTHER): Payer: Self-pay | Admitting: *Deleted

## 2021-11-21 DIAGNOSIS — E785 Hyperlipidemia, unspecified: Secondary | ICD-10-CM

## 2021-11-21 LAB — COMPREHENSIVE METABOLIC PANEL
ALT: 17 IU/L (ref 0–44)
AST: 16 IU/L (ref 0–40)
Albumin/Globulin Ratio: 1.9 (ref 1.2–2.2)
Albumin: 4.3 g/dL (ref 3.8–4.8)
Alkaline Phosphatase: 128 IU/L — ABNORMAL HIGH (ref 44–121)
BUN/Creatinine Ratio: 17 (ref 10–24)
BUN: 16 mg/dL (ref 8–27)
Bilirubin Total: 0.6 mg/dL (ref 0.0–1.2)
CO2: 24 mmol/L (ref 20–29)
Calcium: 9.7 mg/dL (ref 8.6–10.2)
Chloride: 99 mmol/L (ref 96–106)
Creatinine, Ser: 0.93 mg/dL (ref 0.76–1.27)
Globulin, Total: 2.3 g/dL (ref 1.5–4.5)
Glucose: 221 mg/dL — ABNORMAL HIGH (ref 70–99)
Potassium: 4.6 mmol/L (ref 3.5–5.2)
Sodium: 136 mmol/L (ref 134–144)
Total Protein: 6.6 g/dL (ref 6.0–8.5)
eGFR: 87 mL/min/{1.73_m2} (ref >59)

## 2021-11-21 LAB — CBC WITH DIFFERENTIAL/PLATELET
Basophils Absolute: 0 10*3/uL (ref 0.0–0.2)
Basos: 0 %
EOS (ABSOLUTE): 0.1 10*3/uL (ref 0.0–0.4)
Eos: 1 %
Hematocrit: 45.7 % (ref 37.5–51.0)
Hemoglobin: 16.2 g/dL (ref 13.0–17.7)
Immature Grans (Abs): 0 10*3/uL (ref 0.0–0.1)
Immature Granulocytes: 0 %
Lymphocytes Absolute: 2.8 10*3/uL (ref 0.7–3.1)
Lymphs: 31 %
MCH: 33.8 pg — ABNORMAL HIGH (ref 26.6–33.0)
MCHC: 35.4 g/dL (ref 31.5–35.7)
MCV: 95 fL (ref 79–97)
Monocytes Absolute: 0.7 10*3/uL (ref 0.1–0.9)
Monocytes: 8 %
Neutrophils Absolute: 5.4 10*3/uL (ref 1.4–7.0)
Neutrophils: 60 %
Platelets: 169 10*3/uL (ref 150–450)
RBC: 4.79 x10E6/uL (ref 4.14–5.80)
RDW: 12.1 % (ref 11.6–15.4)
WBC: 8.9 10*3/uL (ref 3.4–10.8)

## 2021-11-21 LAB — TSH: TSH: 0.278 u[IU]/mL — ABNORMAL LOW (ref 0.450–4.500)

## 2021-11-21 NOTE — Telephone Encounter (Signed)
See note

## 2021-11-23 LAB — T3: T3, Total: 96 ng/dL (ref 71–180)

## 2021-11-23 LAB — SPECIMEN STATUS REPORT

## 2021-11-23 LAB — T4: T4, Total: 8.2 ug/dL (ref 4.5–12.0)

## 2021-11-23 NOTE — Telephone Encounter (Signed)
His TSH was a bit low. Can we have him come back here to recheck in 1-2 weeks before we make any dose adjustments?  Algis Greenhouse. Jerline Pain, MD 11/23/2021 12:38 PM

## 2021-12-07 ENCOUNTER — Ambulatory Visit (INDEPENDENT_AMBULATORY_CARE_PROVIDER_SITE_OTHER): Payer: Medicare HMO

## 2021-12-07 DIAGNOSIS — I4819 Other persistent atrial fibrillation: Secondary | ICD-10-CM | POA: Diagnosis not present

## 2021-12-08 LAB — ECHOCARDIOGRAM COMPLETE
Area-P 1/2: 3.61 cm2
S' Lateral: 4.39 cm

## 2021-12-14 ENCOUNTER — Other Ambulatory Visit: Payer: Self-pay

## 2021-12-14 ENCOUNTER — Inpatient Hospital Stay: Payer: Medicare HMO | Attending: Internal Medicine

## 2021-12-14 DIAGNOSIS — C3432 Malignant neoplasm of lower lobe, left bronchus or lung: Secondary | ICD-10-CM | POA: Diagnosis not present

## 2021-12-14 DIAGNOSIS — Z452 Encounter for adjustment and management of vascular access device: Secondary | ICD-10-CM | POA: Insufficient documentation

## 2021-12-14 DIAGNOSIS — Z95828 Presence of other vascular implants and grafts: Secondary | ICD-10-CM

## 2021-12-14 MED ORDER — SODIUM CHLORIDE 0.9% FLUSH
10.0000 mL | INTRAVENOUS | Status: DC | PRN
  Administered 2021-12-14: 10 mL

## 2021-12-14 MED ORDER — HEPARIN SOD (PORK) LOCK FLUSH 100 UNIT/ML IV SOLN
500.0000 [IU] | Freq: Once | INTRAVENOUS | Status: AC | PRN
  Administered 2021-12-14: 500 [IU]

## 2021-12-15 ENCOUNTER — Telehealth: Payer: Self-pay | Admitting: Family Medicine

## 2021-12-15 NOTE — Telephone Encounter (Signed)
Spoke with spouse she stated he WCB this afternoon to sched AWV

## 2021-12-18 ENCOUNTER — Telehealth: Payer: Self-pay | Admitting: Family Medicine

## 2021-12-18 NOTE — Telephone Encounter (Signed)
Copied from [place] [phone]. Topic: Medicare AWV >> Dec 18, 2021 10:34 AM Devoria Glassing wrote: Reason for CRM: Left message for patient to schedule Annual Wellness Visit.  Please schedule with Nurse Health Advisor Charlott Rakes, RN at [facility]. This appt can be telephone or office visit. Please call [phone] ask for [facility]

## 2021-12-21 ENCOUNTER — Ambulatory Visit (HOSPITAL_BASED_OUTPATIENT_CLINIC_OR_DEPARTMENT_OTHER): Payer: Medicare HMO | Admitting: Family

## 2021-12-21 ENCOUNTER — Encounter (HOSPITAL_BASED_OUTPATIENT_CLINIC_OR_DEPARTMENT_OTHER): Payer: Self-pay | Admitting: Family

## 2021-12-21 VITALS — BP 120/72 | HR 98 | Ht 73.0 in | Wt 239.0 lb

## 2021-12-21 DIAGNOSIS — I4821 Permanent atrial fibrillation: Secondary | ICD-10-CM | POA: Diagnosis not present

## 2021-12-21 DIAGNOSIS — J449 Chronic obstructive pulmonary disease, unspecified: Secondary | ICD-10-CM

## 2021-12-21 DIAGNOSIS — E039 Hypothyroidism, unspecified: Secondary | ICD-10-CM

## 2021-12-21 DIAGNOSIS — C3492 Malignant neoplasm of unspecified part of left bronchus or lung: Secondary | ICD-10-CM | POA: Diagnosis not present

## 2021-12-21 DIAGNOSIS — D6859 Other primary thrombophilia: Secondary | ICD-10-CM | POA: Diagnosis not present

## 2021-12-21 DIAGNOSIS — I5020 Unspecified systolic (congestive) heart failure: Secondary | ICD-10-CM | POA: Diagnosis not present

## 2021-12-21 MED ORDER — FUROSEMIDE 20 MG PO TABS: ORAL_TABLET | ORAL | 0 refills | Status: DC

## 2021-12-21 MED ORDER — BISOPROLOL FUMARATE 5 MG PO TABS: 5.0000 mg | ORAL_TABLET | Freq: Every day | ORAL | 2 refills | Status: DC

## 2021-12-21 NOTE — Progress Notes (Signed)
Office Visit    Patient Name: Victor Huber Date of Encounter: 12/21/2021  PCP:  [name], MD   [place]  Cardiologist:  Skeet Latch, MD  Advanced Practice Provider:  No care team member to display Electrophysiologist:  None      Chief Complaint    Victor Huber is a 71 y.o. male presents today for follow up after transition to Metoprolol   Past Medical History    Past Medical History:  Diagnosis Date   Adenocarcinoma of left lung, stage 3 ([place]) 08/15/2016   Allergy    Anxiety    Arthritis    Atrial fibrillation ([place]) 08/15/2016   Atrial fibrillation ([place])    Cancer, metastatic to bone [facility])    lung   Cataract    Waiting to schedule bilateral cataract surgery   COPD (chronic obstructive pulmonary disease) ([place]) 08/15/2016   Depression    Dysphagia    Dysrhythmia    a-fib   GERD (gastroesophageal reflux disease)    Heart murmur    atenlol for A Fib/Eliquis   History of chemotherapy    History of radiation therapy    Hyperlipidemia    Hypertension    Hypothyroid 08/15/2016   Longstanding persistent atrial fibrillation ([place]) 08/29/2016   Pathologic fracture    left femur   Pneumonitis    S/P TURP 08/15/2016   Wears glasses    Wears hearing aid in both ears    Past Surgical History:  Procedure Laterality Date   BIOPSY  12/21/2019   Procedure: BIOPSY;  Surgeon: Irving Copas., MD;  Location: [place];  Service: Gastroenterology;;   BIOPSY  05/23/2020   Procedure: BIOPSY;  Surgeon: Irving Copas., MD;  Location: [facility];  Service: Gastroenterology;;   BRONCHOSCOPY  10/2014   CARDIAC CATHETERIZATION     05/07/12   CARDIOVERSION     x2   COLONOSCOPY     several yrs   DG BIOPSY LUNG Left 10/2014   FNA - Adenocarcinoma    ESOPHAGOGASTRODUODENOSCOPY (EGD) WITH PROPOFOL N/A 12/21/2019   Procedure: ESOPHAGOGASTRODUODENOSCOPY (EGD) WITH PROPOFOL;  Surgeon: Irving Copas., MD;   Location: [facility] ENDOSCOPY;  Service: Gastroenterology;  Laterality: N/A;   ESOPHAGOGASTRODUODENOSCOPY (EGD) WITH PROPOFOL N/A 05/23/2020   Procedure: ESOPHAGOGASTRODUODENOSCOPY (EGD) WITH PROPOFOL;  Surgeon: Rush Landmark Telford Nab., MD;  Location: [facility] ENDOSCOPY;  Service: Gastroenterology;  Laterality: N/A;   ESOPHAGOGASTRODUODENOSCOPY (EGD) WITH PROPOFOL N/A 06/30/2020   Procedure: ESOPHAGOGASTRODUODENOSCOPY (EGD) WITH PROPOFOL;  Surgeon: Rush Landmark Telford Nab., MD;  Location: [place];  Service: Gastroenterology;  Laterality: N/A;  ultra slim scope avail   EUS N/A 12/21/2019   Procedure: UPPER ENDOSCOPIC ULTRASOUND (EUS) RADIAL;  Surgeon: Irving Copas., MD;  Location: [place];  Service: Gastroenterology;  Laterality: N/A;   FEMUR IM NAIL Left 02/19/2017   FEMUR IM NAIL Left 02/19/2017   Procedure: INTRAMEDULLARY (IM) NAIL FEMORAL;  Surgeon: Marchia Bond, MD;  Location: [place];  Service: Orthopedics;  Laterality: Left;   IR FLUORO GUIDE PORT INSERTION RIGHT  07/03/2017   IR US GUIDE VASC ACCESS RIGHT  07/03/2017   LUNG CANCER SURGERY Left 12/2014   Wedge Resection    MULTIPLE TOOTH EXTRACTIONS     SAVORY DILATION N/A 05/23/2020   Procedure: SAVORY DILATION;  Surgeon: Irving Copas., MD;  Location: [facility] ENDOSCOPY;  Service: Gastroenterology;  Laterality: N/A;   SAVORY DILATION N/A 06/30/2020   Procedure: SAVORY DILATION;  Surgeon: Rush Landmark Telford Nab., MD;  Location:  [facility];  Service: Gastroenterology;  Laterality: N/A;   Status post TURP     TONSILLECTOMY     UPPER GASTROINTESTINAL ENDOSCOPY      Allergies  Allergies  Allergen Reactions   Carboplatin Itching, Nausea And Vomiting and Other (See Comments)    Flushing   Flecainide Hypertension    CAUSED HEART ISSUES    Debrox [Carbamide Peroxide]     Swelling in ear canal.     History of Present Illness    Victor Huber is a 71 y.o. male with a hx of atrial fibrillation, COPD, hypothyroidism,  metastatic non-small cell cancer s/p resection on chemotherapy, prior tobacco use last seen 11/20/21.  Previously lived in [place].  Diagnosed with atrial fibrillation in 2012.  05/2012 LHC at [facility] with no coronary atherosclerosis. Previously on amiodarone but discontinued when diagnosed with lung cancer in 2016.  Switched to sotalol but failed subsequent DCCV.  Tried flecainide but developed a WCT and subsequent cardiac arrest.  Metoprolol switched to atenolol due to previously feeling better on the medication.  He was referred to atrial fibrillation clinic and not felt to be a good candidate for ablation and declined antiarrhythmics due to cost.Nuclear stress test 12/2019 multiple artifacts no ischemia.  Lest seen 11/20/21 by Dr. Oval Linsey.  Noting more short of breath at times.  CBC without anemia, CMP normal kidney function though mild elevation alkaline phosphatase.  TSH low but T3/T4 normal. HR not at goal - Atenolol switched to Metoprolol. Recommended to follow-up with primary care.  Echocardiogram 12/07/2021 with LVEF 40-45%, LV global hypokinesis, mild LVH, RV SF mildly reduced. (Prior 2017 EF 45-50%)  Since last seen tells me he feels short of breath with as little activity as tying his shoes. Notes orthopnea. Feels breathing worse since last seen. His weight is up 5 pounds over 1 month. Endorses often eating high salt snacks and drinking >64 oz fluid per day. We reviewed his echocardiogram today. Checking BP at home with upper arm cuff. Has switched to new BP cuff. Readings overall 110s/60s. No lightheadedness dizziness. Notes persistent palpitations.   Home cuff accuracy assessment: Manual 120/72 Home cuff initial 144/102 and repeat 105/75  BP home cuff 144/102 and home cuff 105/75 our manual reading 120/72   EKGs/Labs/Other Studies Reviewed:   The following studies were reviewed today: Echo 12/07/21   1. Techinically difficult study. Left ventricular ejection  fraction, by  estimation, is 40 to 45%. The left ventricle has mildly decreased  function. The left ventricle demonstrates global hypokinesis. There is  mild left ventricular hypertrophy. Left  ventricular diastolic parameters are indeterminate.   2. Right ventricular systolic function is mildly reduced. The right  ventricular size is moderately enlarged. Tricuspid regurgitation signal is  inadequate for assessing PA pressure.   3. The mitral valve is normal in structure. Trivial mitral valve  regurgitation. No evidence of mitral stenosis.   4. The aortic valve was not well visualized. Aortic valve regurgitation  is not visualized. Aortic valve sclerosis/calcification is present,  without any evidence of aortic stenosis.   5. The inferior vena cava is normal in size with greater than 50%  respiratory variability, suggesting right atrial pressure of 3 mmHg.   EKG:  No EKG today.   Recent Labs: 11/20/2021: ALT 17; BUN 16; Creatinine, Ser 0.93; Hemoglobin 16.2; Platelets 169; Potassium 4.6; Sodium 136; TSH 0.278  Recent Lipid Panel    Component Value Date/Time   CHOL 122 03/06/2019 0956   CHOL  115 12/19/2016 0933   TRIG 164.0 (H) 09/10/2019 1640   HDL 35.90 (L) 03/06/2019 0956   HDL 33 (L) 12/19/2016 0933   CHOLHDL 3 03/06/2019 0956   VLDL 40.6 (H) 03/06/2019 0956   LDLCALC 34 12/19/2016 0933   LDLDIRECT 55.0 03/06/2019 0956    Risk Assessment/Calculations:   CHA2DS2-VASc Score = 3   This indicates a 3.2% annual risk of stroke. The patient's score is based upon: CHF History: 1 HTN History: 0 Diabetes History: 0 Stroke History: 0 Vascular Disease History: 1 Age Score: 1 Gender Score: 0     Home Medications   Current Meds  Medication Sig   acetaminophen (TYLENOL) 500 MG tablet Take 500 mg by mouth every 8 (eight) hours as needed for moderate pain.   albuterol (VENTOLIN HFA) 108 (90 Base) MCG/ACT inhaler INHALE 1 TO 2 PUFFS EVERY 4 HOURS AS NEEDED FOR WHEEZING  OR FOR  SHORTNESS OF BREATH   atorvastatin (LIPITOR) 40 MG tablet TAKE 1 TABLET EVERY DAY   diazepam (VALIUM) 5 MG tablet Take 1 tablet (5 mg total) by mouth every 12 (twelve) hours as needed for anxiety.   DULoxetine (CYMBALTA) 60 MG capsule TAKE 1 CAPSULE EVERY DAY   ELIQUIS 5 MG TABS tablet TAKE 1 TABLET TWICE DAILY   esomeprazole (NEXIUM) 40 MG capsule TAKE 1 CAPSULE (40 MG TOTAL) BY MOUTH 2 (TWO) TIMES DAILY BEFORE A MEAL.   gabapentin (NEURONTIN) 300 MG capsule Take 2 capsules (600 mg total) by mouth at bedtime as needed.   ibuprofen (ADVIL,MOTRIN) 400 MG tablet Take 400 mg by mouth every 4 (four) hours as needed for moderate pain.   levothyroxine (SYNTHROID) 200 MCG tablet TAKE 1 TABLET ONE TIME DAILY BEFORE BREAKFAST   lipase/protease/amylase (CREON) 36000 UNITS CPEP capsule Take 4 capsules po during each meal. Take 1-2 capsules po during each snack.( up to 2 snacks daily).   metoprolol succinate (TOPROL-XL) 50 MG 24 hr tablet Take 1 tablet (50 mg total) by mouth daily. Take with or immediately following a meal.   Oxycodone HCl 10 MG TABS Take 1 tablet (10 mg total) by mouth daily as needed (pain).   sucralfate (CARAFATE) 1 g tablet TAKE 1 TABLET FOUR TIMES DAILY WITH MEALS AND AT BEDTIME   tamsulosin (FLOMAX) 0.4 MG CAPS capsule Take 1 capsule (0.4 mg total) by mouth daily.     Review of Systems      All other systems reviewed and are otherwise negative except as noted above.  Physical Exam    VS:  BP 120/72   Pulse 98   Ht 6\' 1"  (1.854 m)   Wt 239 lb (108.4 kg)   BMI 31.53 kg/m  , BMI Body mass index is 31.53 kg/m.  Wt Readings from Last 3 Encounters:  12/21/21 239 lb (108.4 kg)  11/20/21 234 lb 11.2 oz (106.5 kg)  09/05/21 228 lb (103.4 kg)     GEN: Well nourished, overweight,well developed, in no acute distress. HEENT: normal. Neck: Supple, no JVD, carotid bruits, or masses. Cardiac: IRIR, no murmurs, rubs, or gallops. No clubbing, cyanosis, edema.  Radials/PT 2+ and equal  bilaterally.  Respiratory:  Respirations regular and unlabored, clear to auscultation bilaterally. GI: Soft, nontender, nondistended. MS: No deformity or atrophy. Skin: Warm and dry, no rash. Neuro:  Strength and sensation are intact. Psych: Normal affect.  Assessment & Plan    HFmrEF - 12/2021 EF 40-45%, RWMA (prior 2017 45-50%). Weight up 5 pounds over the last month.  Discussed  this is likely the etiology of his dyspnea, orthopnea.  Rx Lasix 40 mg daily for 2 days then 20 mg daily.  BMP in 2 weeks.  Future considerations include Entresto, MRA, SGLT2i.  We will have to be cautious to avoid hypotension. Low sodium diet, fluid restriction <2L, and daily weights encouraged. Educated to contact our office for weight gain of 2 lbs overnight or 5 lbs in one week.   Chronic atrial fibrillation / Hypercoagulable state - Since Toprol 50mg  QD feels this dyspnea is worsened.  We discussed this is likely reduced to reduced EF and fluid retention but he preferred to trial alternate agent.  We will stop metoprolol and start bisoprolol 5 mg daily.. Continue Eliquis 5mg  BID.   COPD / Metastatic lung adenocarcinoma - Stable by recent imaging.  Encouraged to follow overdue follow-up with pulmonology as I believe with his dyspnea he may benefit from a bronchodilator.  Hypothyroidism - Continue to follow with PCP.  Update thyroid panel today as was unable to collect PCP office recently as phlebotomist not available.       Disposition: Follow up in 6 week(s) with Skeet Latch, MD or APP.  Signed, [name], NP 12/21/2021, 3:03 PM [place]

## 2021-12-21 NOTE — Patient Instructions (Addendum)
Medication Instructions:  Your physician has recommended you make the following change in your medication:   START Furosemide (Lasix) take 2 tablets (40mg ) for two days then change to 1 tablet (20mg ) daily in the morning  STOP Metoprolol  START Bisoprolol one 5mg  tablet daily  *If you need a refill on your cardiac medications before your next appointment, please call your pharmacy*  Lab Work: Your physician recommends that you return for lab work today: thyroid panel  Your physician recommends that you return for lab work in 2 weeks for BMP  If you have labs (blood work) drawn today and your tests are completely normal, you will receive your results only by: [place] (if you have MyChart) OR A paper copy in the mail If you have any lab test that is abnormal or we need to change your treatment, we will call you to review the results.  Testing/Procedures: Your echocardiogram showed your heart muscle function is mildly reduced.    Follow-Up: At [facility], you and your health needs are our priority.  As part of our continuing mission to provide you with exceptional heart care, we have created designated Provider Care Teams.  These Care Teams include your primary Cardiologist (physician) and Advanced Practice Providers (APPs -  Physician Assistants and Nurse Practitioners) who all work together to provide you with the care you need, when you need it.  We recommend signing up for the patient portal called "MyChart".  Sign up information is provided on this After Visit Summary.  MyChart is used to connect with patients for Virtual Visits (Telemedicine).  Patients are able to view lab/test results, encounter notes, upcoming appointments, etc.  Non-urgent messages can be sent to your provider as well.   To learn more about what you can do with MyChart, go to [email or web address].    Your next appointment:   6 week(s)  The format for your next appointment:   In  Person  Provider:   Skeet Latch, MD or Laurann Montana, NP  Other Instructions  Recommend calling pulmonology to schedule an appointment:  [phone]  Heart Healthy Diet Recommendations: A low-salt diet is recommended. Meats should be grilled, baked, or boiled. Avoid fried foods. Focus on lean protein sources like fish or chicken with vegetables and fruits. The American Heart Association is a Microbiologist!  American Heart Association Diet and Lifeystyle Recommendations  Try to drink less than 2 liters (64 oz) of fluid per day.   Exercise recommendations: The American Heart Association recommends 150 minutes of moderate intensity exercise weekly. Try 30 minutes of moderate intensity exercise 4-5 times per week. This could include walking, jogging, or swimming.  Recommend weighing daily and keeping a log. Please call our office if you have weight gain of 2 pounds overnight or 5 pounds in 1 week.   Date  Time Weight

## 2021-12-22 ENCOUNTER — Encounter (HOSPITAL_BASED_OUTPATIENT_CLINIC_OR_DEPARTMENT_OTHER): Payer: Self-pay | Admitting: Family

## 2021-12-22 LAB — THYROID PANEL WITH TSH
Free Thyroxine Index: 2.4 (ref 1.2–4.9)
T3 Uptake Ratio: 28 % (ref 24–39)
T4, Total: 8.4 ug/dL (ref 4.5–12.0)
TSH: 0.64 u[IU]/mL (ref 0.450–4.500)

## 2021-12-25 ENCOUNTER — Encounter: Payer: Self-pay | Admitting: *Deleted

## 2022-01-02 ENCOUNTER — Encounter: Payer: Self-pay | Admitting: Internal Medicine

## 2022-01-04 ENCOUNTER — Ambulatory Visit: Payer: Medicare HMO | Admitting: Nurse Practitioner

## 2022-01-04 ENCOUNTER — Encounter: Payer: Self-pay | Admitting: Nurse Practitioner

## 2022-01-04 VITALS — BP 144/72 | HR 61 | Temp 97.9°F | Ht 73.0 in | Wt 234.4 lb

## 2022-01-04 DIAGNOSIS — C3492 Malignant neoplasm of unspecified part of left bronchus or lung: Secondary | ICD-10-CM | POA: Diagnosis not present

## 2022-01-04 DIAGNOSIS — J432 Centrilobular emphysema: Secondary | ICD-10-CM

## 2022-01-04 DIAGNOSIS — R0602 Shortness of breath: Secondary | ICD-10-CM | POA: Diagnosis not present

## 2022-01-04 DIAGNOSIS — I5042 Chronic combined systolic (congestive) and diastolic (congestive) heart failure: Secondary | ICD-10-CM

## 2022-01-04 DIAGNOSIS — I509 Heart failure, unspecified: Secondary | ICD-10-CM | POA: Insufficient documentation

## 2022-01-04 DIAGNOSIS — I482 Chronic atrial fibrillation, unspecified: Secondary | ICD-10-CM | POA: Diagnosis not present

## 2022-01-04 HISTORY — DX: Heart failure, unspecified: I50.9

## 2022-01-04 MED ORDER — SPIRIVA RESPIMAT 2.5 MCG/ACT IN AERS: 2.0000 | INHALATION_SPRAY | Freq: Every day | RESPIRATORY_TRACT | 5 refills | Status: DC

## 2022-01-04 NOTE — Assessment & Plan Note (Signed)
EF 40-45% from echo 12/2021. He also has some RV dysfunction; likely cor pulmonale. He is euvolemic today. Hasn't noticed much response form a respiratory standpoint with diuretic therapy. Follow up with cardiology as scheduled and monitor weights.

## 2022-01-04 NOTE — Progress Notes (Signed)
@Patient  ID: Victor Huber, male    DOB: [birth year], 71 y.o.   MRN: [id number]  No chief complaint on file.   Referring provider: [name], MD  HPI: 71 year old male, former smoker followed for emphysema and chronic cough. He is a patient of Dr. Agustina Caroli and last seen in office 05/18/2020. He has adenocarcinoma of the left lung, stage IV, s/p LLL wedge resection, XRT/chemo and keytruda, and currently under surveillance with Dr. Julien Nordmann. Past medical history significant for HFpEF, PAF on Eliquis, HTN, chronic pancreatitis, GERD, esophageal stricture, hypothyroid, BPH, HLD, anxiety  TEST/EVENTS:  08/11/2021 CT chest w con: mild atherosclerosis. There are similar appearing right paratracheal and subcarinal LAD. There is post treatment scarring in the left hilum. Advanced emphysematous changes.  LUL and LLL scarring, consistent with previous radiation therapy. Nonunion fx lateral left seventh rib.  01/04/2022: Today - follow up Patient presents today for overdue follow up. He was recently seen by cardiology and reported worsening dyspnea. Suspected to be multifactorial related to his CHF and underlying COPD. He was started on lasix, which he is taking daily, but hasn't noticed much change yet. He tells me his breathing has been declining for at least the past two years. No acute worsening. Feels like he's been very limited in what he can do and has noticed it more this year. Can't tie his shoes without getting winded. He wants to be able to exercise but doesn't have the stamina to be able to. He does have an occasional dry cough, which is stable. Doesn't ever notice any wheezing. He was having some swelling in his legs, but this is better. He's not currently on any maintenance inhaler. He rarely uses his albuterol as he keeps it in his bathroom and feels like it's more work to go get it. Instead he just sits to rest and catch his breath. Lives a relatively sedentary lifestyle because of his  dyspnea. He denies hemoptysis, weight loss, anorexia, fevers, chills, PND. He does occasionally have some orthopnea. Last CT chest was May 2023; symptoms relatively unchanged compared to then per his report.   Allergies  Allergen Reactions   Carboplatin Itching, Nausea And Vomiting and Other (See Comments)    Flushing   Flecainide Hypertension    CAUSED HEART ISSUES    Debrox [Carbamide Peroxide]     Swelling in ear canal.     Immunization History  Administered Date(s) Administered   Fluad Quad(high Dose 65+) 03/10/2019   Influenza-Unspecified 01/16/2017, 02/01/2020, 01/23/2021   PFIZER(Purple Top)SARS-COV-2 Vaccination 05/11/2019, 06/05/2019, 11/19/2019   Pfizer Covid-19 Vaccine Bivalent Booster 80yrs & up 01/23/2021   Pneumococcal Conjugate-13 11/28/2016   Pneumococcal-Unspecified 09/27/2014    Past Medical History:  Diagnosis Date   Adenocarcinoma of left lung, stage 3 ([place]) 08/15/2016   Allergy    Anxiety    Arthritis    Atrial fibrillation ([place]) 08/15/2016   Atrial fibrillation (HCC)    Cancer, metastatic to bone [facility])    lung   Cataract    Waiting to schedule bilateral cataract surgery   CHF (congestive heart failure) ([place]) 01/04/2022   COPD (chronic obstructive pulmonary disease) ([place]) 08/15/2016   Depression    Dysphagia    Dysrhythmia    a-fib   GERD (gastroesophageal reflux disease)    Heart murmur    atenlol for A Fib/Eliquis   History of chemotherapy    History of radiation therapy    Hyperlipidemia    Hypertension    Hypothyroid 08/15/2016  Longstanding persistent atrial fibrillation ([place]) 08/29/2016   Pathologic fracture    left femur   Pneumonitis    S/P TURP 08/15/2016   Wears glasses    Wears hearing aid in both ears     Tobacco History: Social History   Tobacco Use  Smoking Status Former   Packs/day: 1.00   Years: 50.00   Total pack years: 50.00   Types: Cigarettes   Quit date: 08/15/2012   Years since quitting: 9.3  Smokeless  Tobacco Never   Counseling given: Not Answered   Outpatient Medications Prior to Visit  Medication Sig Dispense Refill   acetaminophen (TYLENOL) 500 MG tablet Take 500 mg by mouth every 8 (eight) hours as needed for moderate pain.     albuterol (VENTOLIN HFA) 108 (90 Base) MCG/ACT inhaler INHALE 1 TO 2 PUFFS EVERY 4 HOURS AS NEEDED FOR WHEEZING  OR FOR SHORTNESS OF BREATH 18 g 3   atorvastatin (LIPITOR) 40 MG tablet TAKE 1 TABLET EVERY DAY 90 tablet 0   bisoprolol (ZEBETA) 5 MG tablet Take 1 tablet (5 mg total) by mouth daily. 30 tablet 2   diazepam (VALIUM) 5 MG tablet Take 1 tablet (5 mg total) by mouth every 12 (twelve) hours as needed for anxiety. 30 tablet 1   DULoxetine (CYMBALTA) 60 MG capsule TAKE 1 CAPSULE EVERY DAY 90 capsule 0   ELIQUIS 5 MG TABS tablet TAKE 1 TABLET TWICE DAILY 180 tablet 1   esomeprazole (NEXIUM) 40 MG capsule TAKE 1 CAPSULE (40 MG TOTAL) BY MOUTH 2 (TWO) TIMES DAILY BEFORE A MEAL. 180 capsule 3   furosemide (LASIX) 20 MG tablet Take 2 tablets (40 mg total) by mouth daily for 2 days, THEN 1 tablet (20 mg total) daily. 32 tablet 0   gabapentin (NEURONTIN) 300 MG capsule Take 2 capsules (600 mg total) by mouth at bedtime as needed. 180 capsule 3   ibuprofen (ADVIL,MOTRIN) 400 MG tablet Take 400 mg by mouth every 4 (four) hours as needed for moderate pain.     levothyroxine (SYNTHROID) 200 MCG tablet TAKE 1 TABLET ONE TIME DAILY BEFORE BREAKFAST 90 tablet 0   lipase/protease/amylase (CREON) 36000 UNITS CPEP capsule Take 4 capsules po during each meal. Take 1-2 capsules po during each snack.( up to 2 snacks daily). 480 capsule 3   Oxycodone HCl 10 MG TABS Take 1 tablet (10 mg total) by mouth daily as needed (pain). 30 tablet 0   sucralfate (CARAFATE) 1 g tablet TAKE 1 TABLET FOUR TIMES DAILY WITH MEALS AND AT BEDTIME 360 tablet 1   tamsulosin (FLOMAX) 0.4 MG CAPS capsule Take 1 capsule (0.4 mg total) by mouth daily. 90 capsule 4   Facility-Administered Medications  Prior to Visit  Medication Dose Route Frequency Provider Last Rate Last Admin   sodium chloride flush (NS) 0.9 % injection 10 mL  10 mL Intracatheter PRN Curt Bears, MD   10 mL at 10/13/20 1028     Review of Systems:   Constitutional: No weight loss or gain, night sweats, fevers, chills, or lassitude. +fatigue HEENT: No headaches, difficulty swallowing, tooth/dental problems, or sore throat. No sneezing, itching, ear ache, nasal congestion, or post nasal drip CV:  +swelling in lower extremities (improved); occasional orthopnea. No chest pain, PND, anasarca, dizziness, palpitations, syncope Resp: +shortness of breath with exertion; chronic cough. No excess mucus or change in color of mucus. No hemoptysis. No wheezing.  No chest wall deformity GI:  No heartburn, indigestion, abdominal pain, nausea, vomiting, diarrhea, change  in bowel habits, loss of appetite, bloody stools.  MSK:  No joint pain or swelling.  No decreased range of motion.  No back pain. Neuro: No dizziness or lightheadedness.  Psych: No depression or anxiety. Mood stable.     Physical Exam:  BP (!) 144/72 (BP Location: Right Arm, Patient Position: Sitting, Cuff Size: Large)   Pulse 61   Temp 97.9 F (36.6 C) (Oral)   Ht 6\' 1"  (1.854 m)   Wt 234 lb 6.4 oz (106.3 kg)   SpO2 95%   BMI 30.93 kg/m   GEN: Pleasant, interactive, well-developed; obese; in no acute distress. HEENT:  Normocephalic and atraumatic. PERRLA. Sclera white. Nasal turbinates pink, moist and patent bilaterally. No rhinorrhea present. Oropharynx pink and moist, without exudate or edema. No lesions, ulcerations, or postnasal drip.  NECK:  Supple w/ fair ROM. No JVD present. Normal carotid impulses w/o bruits. Thyroid symmetrical with no goiter or nodules palpated. No lymphadenopathy.   CV: RRR, no m/r/g, no peripheral edema. Pulses intact, +2 bilaterally. No cyanosis, pallor or clubbing. PULMONARY:  Unlabored, regular breathing. Clear bilaterally  A&P w/o wheezes/rales/rhonchi. No accessory muscle use.  GI: BS present and normoactive. Soft, non-tender to palpation. No organomegaly or masses detected. MSK: No erythema, warmth or tenderness. Cap refil <2 sec all extrem. No deformities or joint swelling noted.  Neuro: A/Ox3. No focal deficits noted.   Skin: Warm, no lesions or rashe Psych: Normal affect and behavior. Judgement and thought content appropriate.     Lab Results:  CBC    Component Value Date/Time   WBC 8.9 11/20/2021 1621   WBC 7.8 08/11/2021 1244   WBC 7.6 09/10/2019 1640   RBC 4.79 11/20/2021 1621   RBC 4.66 08/11/2021 1244   HGB 16.2 11/20/2021 1621   HGB 14.2 04/04/2017 1141   HCT 45.7 11/20/2021 1621   HCT 41.3 04/04/2017 1141   PLT 169 11/20/2021 1621   MCV 95 11/20/2021 1621   MCV 94.5 04/04/2017 1141   MCH 33.8 (H) 11/20/2021 1621   MCH 33.5 08/11/2021 1244   MCHC 35.4 11/20/2021 1621   MCHC 35.9 08/11/2021 1244   RDW 12.1 11/20/2021 1621   RDW 14.6 04/04/2017 1141   LYMPHSABS 2.8 11/20/2021 1621   LYMPHSABS 0.9 04/04/2017 1141   MONOABS 0.5 08/11/2021 1244   MONOABS 0.3 04/04/2017 1141   EOSABS 0.1 11/20/2021 1621   BASOSABS 0.0 11/20/2021 1621   BASOSABS 0.0 04/04/2017 1141    BMET    Component Value Date/Time   NA 136 11/20/2021 1621   NA 136 04/04/2017 1141   K 4.6 11/20/2021 1621   K 4.4 04/04/2017 1141   CL 99 11/20/2021 1621   CO2 24 11/20/2021 1621   CO2 24 04/04/2017 1141   GLUCOSE 221 (H) 11/20/2021 1621   GLUCOSE 158 (H) 08/11/2021 1244   GLUCOSE 116 04/04/2017 1141   BUN 16 11/20/2021 1621   BUN 21.1 04/04/2017 1141   CREATININE 0.93 11/20/2021 1621   CREATININE 0.84 08/11/2021 1244   CREATININE 1.1 04/04/2017 1141   CALCIUM 9.7 11/20/2021 1621   CALCIUM 9.1 04/04/2017 1141   GFRNONAA >60 08/11/2021 1244   GFRAA >60 12/31/2019 1130    BNP No results found for: "BNP"   Imaging:  ECHOCARDIOGRAM COMPLETE  Result Date: 12/08/2021    ECHOCARDIOGRAM REPORT    Patient Name:   NORVELL CASWELL Date of Exam: 12/07/2021 Medical Rec #:  [id number]         Height:  73.0 in Accession #:    [id number]        Weight:       234.7 lb Date of Birth:  July 01, 1950         BSA:          2.303 m Patient Age:    1 years          BP:           120/60 mmHg Patient Gender: M                 HR:           103 bpm. Exam Location:  Outpatient Procedure: 2D Echo, 3D Echo, Color Doppler, Cardiac Doppler and Strain Analysis Indications:    Persistent atrial fibrillation  History:        Patient has prior history of Echocardiogram examinations, most                 recent 01/02/2016. COPD, Arrythmias:Atrial Fibrillation; Risk                 Factors:Former Smoker and Dyslipidemia. Metastatic non-small                 cell lung cancer s/p resection.  Sonographer:    Leavy Cella RDCS Referring Phys: [id number] TIFFANY Oscoda IMPRESSIONS  1. Techinically difficult study. Left ventricular ejection fraction, by estimation, is 40 to 45%. The left ventricle has mildly decreased function. The left ventricle demonstrates global hypokinesis. There is mild left ventricular hypertrophy. Left ventricular diastolic parameters are indeterminate.  2. Right ventricular systolic function is mildly reduced. The right ventricular size is moderately enlarged. Tricuspid regurgitation signal is inadequate for assessing PA pressure.  3. The mitral valve is normal in structure. Trivial mitral valve regurgitation. No evidence of mitral stenosis.  4. The aortic valve was not well visualized. Aortic valve regurgitation is not visualized. Aortic valve sclerosis/calcification is present, without any evidence of aortic stenosis.  5. The inferior vena cava is normal in size with greater than 50% respiratory variability, suggesting right atrial pressure of 3 mmHg. FINDINGS  Left Ventricle: Left ventricular ejection fraction, by estimation, is 40 to 45%. The left ventricle has mildly decreased function. The left ventricle  demonstrates global hypokinesis. The left ventricular internal cavity size was normal in size. There is  mild left ventricular hypertrophy. Left ventricular diastolic parameters are indeterminate. Right Ventricle: The right ventricular size is moderately enlarged. Right vetricular wall thickness was not well visualized. Right ventricular systolic function is mildly reduced. Tricuspid regurgitation signal is inadequate for assessing PA pressure. Left Atrium: Left atrial size was normal in size. Right Atrium: Right atrial size was normal in size. Pericardium: Trivial pericardial effusion is present. Mitral Valve: The mitral valve is normal in structure. Trivial mitral valve regurgitation. No evidence of mitral valve stenosis. Tricuspid Valve: The tricuspid valve is normal in structure. Tricuspid valve regurgitation is trivial. Aortic Valve: The aortic valve was not well visualized. Aortic valve regurgitation is not visualized. Aortic valve sclerosis/calcification is present, without any evidence of aortic stenosis. Pulmonic Valve: The pulmonic valve was not well visualized. Pulmonic valve regurgitation is not visualized. Aorta: The aortic root and ascending aorta are structurally normal, with no evidence of dilitation. Venous: The inferior vena cava is normal in size with greater than 50% respiratory variability, suggesting right atrial pressure of 3 mmHg. IAS/Shunts: The interatrial septum was not well visualized.  LEFT VENTRICLE PLAX 2D LVIDd:  5.57 cm   Diastology LVIDs:         4.39 cm   LV e' medial:    8.92 cm/s LV PW:         1.22 cm   LV E/e' medial:  9.2 LV IVS:        1.02 cm   LV e' lateral:   8.38 cm/s LVOT diam:     2.20 cm   LV E/e' lateral: 9.8 LV SV:         38 LV SV Index:   16 LVOT Area:     3.80 cm                           3D Volume EF:                          3D EF:        43 %                          LV EDV:       143 ml                          LV ESV:       82 ml                           LV SV:        61 ml RIGHT VENTRICLE RV Basal diam:  5.19 cm RV Mid diam:    4.01 cm RV S prime:     10.70 cm/s TAPSE (M-mode): 2.3 cm LEFT ATRIUM             Index        RIGHT ATRIUM           Index LA diam:        5.50 cm 2.39 cm/m   RA Area:     16.20 cm LA Vol (A2C):   78.2 ml 33.96 ml/m  RA Volume:   42.00 ml  18.24 ml/m LA Vol (A4C):   54.5 ml 23.66 ml/m LA Biplane Vol: 66.6 ml 28.92 ml/m  AORTIC VALVE LVOT Vmax:   64.00 cm/s LVOT Vmean:  42.700 cm/s LVOT VTI:    0.100 m  AORTA Ao Root diam: 3.40 cm Ao Asc diam:  3.60 cm MITRAL VALVE MV Area (PHT): 3.61 cm    SHUNTS MV Decel Time: 210 msec    Systemic VTI:  0.10 m MV E velocity: 81.80 cm/s  Systemic Diam: 2.20 cm Oswaldo Milian MD Electronically signed by Oswaldo Milian MD Signature Date/Time: 12/08/2021/12:41:00 PM    Final     heparin lock flush 100 unit/mL     Date Action Dose Route User   12/14/2021 1444 Given 500 Units Intracatheter Hillery Jacks V, LPN      sodium chloride flush (NS) 0.9 % injection 10 mL     Date Action Dose Route User   12/14/2021 1444 Given 10 mL Intracatheter Azzie Almas, LPN           No data to display          No results found for: "NITRICOXIDE"      Assessment & Plan:   Centrilobular emphysema ([place]) Advanced emphysema on imaging with previous moderately severe diffusion defect from  2018 of 55%. He had maintained normal spirometry at the time. Today, he has a decline in FVC and FEV1 and ratio 71% on office spiro. We will order full PFTs for further evaluation. Recommended starting him on maintenance bronchodilators with LAMA therapy. Provided with sample of Spiriva with teach back performed. He showed proper inhaler technique. Encouraged him to remain as active as possible. DOE likely multifactorial r/t severe emphysema, HFpEF, chronic a fib, deconditioning and stage IV adenocarcinoma.   Patient Instructions  Start Spiriva 2 puffs daily Continue Albuterol inhaler 2 puffs  every 6 hours as needed for shortness of breath or wheezing. Notify if symptoms persist despite rescue inhaler/neb use.  Continue lasix 1 tab daily as prescribed by cardiology   Pulmonary function testing ordered today  Follow up after PFTs with Dr. Lamonte Sakai or Alanson Aly. If symptoms do not improve or worsen, please contact office for sooner follow up or seek emergency care.     CHF (congestive heart failure) ([place]) EF 40-45% from echo 12/2021. He also has some RV dysfunction; likely cor pulmonale. He is euvolemic today. Hasn't noticed much response form a respiratory standpoint with diuretic therapy. Follow up with cardiology as scheduled and monitor weights.   Chronic atrial fibrillation (HCC) Chronically anticoagulated with Eliquis. He is rate controlled today.   Adenocarcinoma of left lung, stage 4 ([place]) Supportive therapy with XRT/chemo and Keytruda. Currently under surveillance. Follow up with Dr. Julien Nordmann as scheduled.    I spent 38 minutes of dedicated to the care of this patient on the date of this encounter to include pre-visit review of records, face-to-face time with the patient discussing conditions above, post visit ordering of testing, clinical documentation with the electronic health record, making appropriate referrals as documented, and communicating necessary findings to members of the patients care team.  Clayton Bibles, NP 01/04/2022  Pt aware and understands NP's role.

## 2022-01-04 NOTE — Assessment & Plan Note (Signed)
Chronically anticoagulated with Eliquis. He is rate controlled today.

## 2022-01-04 NOTE — Patient Instructions (Addendum)
Start Spiriva 2 puffs daily Continue Albuterol inhaler 2 puffs every 6 hours as needed for shortness of breath or wheezing. Notify if symptoms persist despite rescue inhaler/neb use.  Continue lasix 1 tab daily as prescribed by cardiology   Pulmonary function testing ordered today  Follow up after PFTs with Dr. Lamonte Sakai or Alanson Aly. If symptoms do not improve or worsen, please contact office for sooner follow up or seek emergency care.

## 2022-01-04 NOTE — Assessment & Plan Note (Addendum)
Supportive therapy with XRT/chemo and Keytruda. Currently under surveillance. Follow up with Dr. Julien Nordmann as scheduled.

## 2022-01-04 NOTE — Assessment & Plan Note (Addendum)
Advanced emphysema on imaging with previous moderately severe diffusion defect from 2018 of 55%. He had maintained normal spirometry at the time. Today, he has a decline in FVC and FEV1 and ratio 71% on office spiro. We will order full PFTs for further evaluation. Recommended starting him on maintenance bronchodilators with LAMA therapy. Provided with sample of Spiriva with teach back performed. He showed proper inhaler technique. Encouraged him to remain as active as possible. DOE likely multifactorial r/t severe emphysema, HFpEF, chronic a fib, deconditioning and stage IV adenocarcinoma.   Patient Instructions  Start Spiriva 2 puffs daily Continue Albuterol inhaler 2 puffs every 6 hours as needed for shortness of breath or wheezing. Notify if symptoms persist despite rescue inhaler/neb use.  Continue lasix 1 tab daily as prescribed by cardiology   Pulmonary function testing ordered today  Follow up after PFTs with Dr. Lamonte Sakai or Alanson Aly. If symptoms do not improve or worsen, please contact office for sooner follow up or seek emergency care.

## 2022-01-19 ENCOUNTER — Ambulatory Visit (INDEPENDENT_AMBULATORY_CARE_PROVIDER_SITE_OTHER): Payer: Medicare HMO

## 2022-01-19 VITALS — Wt 234.0 lb

## 2022-01-19 DIAGNOSIS — Z0000 Encounter for general adult medical examination without abnormal findings: Secondary | ICD-10-CM | POA: Diagnosis not present

## 2022-01-19 NOTE — Patient Instructions (Signed)
Victor Huber , Thank you for taking time to come for your Medicare Wellness Visit. I appreciate your ongoing commitment to your health goals. Please review the following plan we discussed and let me know if I can assist you in the future.   These are the goals we discussed:  Goals      Increase physical activity     Patient Stated     Live  long and pain free     Patient Stated     Patient Stated     Maintain good health         This is a list of the screening recommended for you and due dates:  Health Maintenance  Topic Date Due   Hepatitis C Screening: USPSTF Recommendation to screen - Ages 23-79 yo.  Never done   Tetanus Vaccine  Never done   Zoster (Shingles) Vaccine (1 of 2) Never done   Pneumonia Vaccine (2 - PPSV23 or PCV20) 01/23/2017   COVID-19 Vaccine (5 - Pfizer risk series) 03/20/2021   Flu Shot  10/31/2021   Colon Cancer Screening  01/20/2023*   HPV Vaccine  Aged Out  *Topic was postponed. The date shown is not the original due date.    Advanced directives: Advance directive discussed with you today. Even though you declined this today please call our office should you change your mind and we can give you the proper paperwork for you to fill out.  Conditions/risks identified: maintain good health   Next appointment: Follow up in one year for your annual wellness visit.   Preventive Care 67 Years and Older, Male  Preventive care refers to lifestyle choices and visits with your health care provider that can promote health and wellness. What does preventive care include? A yearly physical exam. This is also called an annual well check. Dental exams once or twice a year. Routine eye exams. Ask your health care provider how often you should have your eyes checked. Personal lifestyle choices, including: Daily care of your teeth and gums. Regular physical activity. Eating a healthy diet. Avoiding tobacco and drug use. Limiting alcohol use. Practicing safe  sex. Taking low doses of aspirin every day. Taking vitamin and mineral supplements as recommended by your health care provider. What happens during an annual well check? The services and screenings done by your health care provider during your annual well check will depend on your age, overall health, lifestyle risk factors, and family history of disease. Counseling  Your health care provider may ask you questions about your: Alcohol use. Tobacco use. Drug use. Emotional well-being. Home and relationship well-being. Sexual activity. Eating habits. History of falls. Memory and ability to understand (cognition). Work and work Statistician. Screening  You may have the following tests or measurements: Height, weight, and BMI. Blood pressure. Lipid and cholesterol levels. These may be checked every 5 years, or more frequently if you are over 68 years old. Skin check. Lung cancer screening. You may have this screening every year starting at age 78 if you have a 30-pack-year history of smoking and currently smoke or have quit within the past 15 years. Fecal occult blood test (FOBT) of the stool. You may have this test every year starting at age 73. Flexible sigmoidoscopy or colonoscopy. You may have a sigmoidoscopy every 5 years or a colonoscopy every 10 years starting at age 37. Prostate cancer screening. Recommendations will vary depending on your family history and other risks. Hepatitis C blood test. Hepatitis B blood test.  Sexually transmitted disease (STD) testing. Diabetes screening. This is done by checking your blood sugar (glucose) after you have not eaten for a while (fasting). You may have this done every 1-3 years. Abdominal aortic aneurysm (AAA) screening. You may need this if you are a current or former smoker. Osteoporosis. You may be screened starting at age 43 if you are at high risk. Talk with your health care provider about your test results, treatment options, and if  necessary, the need for more tests. Vaccines  Your health care provider may recommend certain vaccines, such as: Influenza vaccine. This is recommended every year. Tetanus, diphtheria, and acellular pertussis (Tdap, Td) vaccine. You may need a Td booster every 10 years. Zoster vaccine. You may need this after age 42. Pneumococcal 13-valent conjugate (PCV13) vaccine. One dose is recommended after age 72. Pneumococcal polysaccharide (PPSV23) vaccine. One dose is recommended after age 38. Talk to your health care provider about which screenings and vaccines you need and how often you need them. This information is not intended to replace advice given to you by your health care provider. Make sure you discuss any questions you have with your health care provider. Document Released: 04/15/2015 Document Revised: 12/07/2015 Document Reviewed: 01/18/2015 Elsevier Interactive Patient Education  2017 [place] Prevention in the Home Falls can cause injuries. They can happen to people of all ages. There are many things you can do to make your home safe and to help prevent falls. What can I do on the outside of my home? Regularly fix the edges of walkways and driveways and fix any cracks. Remove anything that might make you trip as you walk through a door, such as a raised step or threshold. Trim any bushes or trees on the path to your home. Use bright outdoor lighting. Clear any walking paths of anything that might make someone trip, such as rocks or tools. Regularly check to see if handrails are loose or broken. Make sure that both sides of any steps have handrails. Any raised decks and porches should have guardrails on the edges. Have any leaves, snow, or ice cleared regularly. Use sand or salt on walking paths during winter. Clean up any spills in your garage right away. This includes oil or grease spills. What can I do in the bathroom? Use night lights. Install grab bars by the toilet  and in the tub and shower. Do not use towel bars as grab bars. Use non-skid mats or decals in the tub or shower. If you need to sit down in the shower, use a plastic, non-slip stool. Keep the floor dry. Clean up any water that spills on the floor as soon as it happens. Remove soap buildup in the tub or shower regularly. Attach bath mats securely with double-sided non-slip rug tape. Do not have throw rugs and other things on the floor that can make you trip. What can I do in the bedroom? Use night lights. Make sure that you have a light by your bed that is easy to reach. Do not use any sheets or blankets that are too big for your bed. They should not hang down onto the floor. Have a firm chair that has side arms. You can use this for support while you get dressed. Do not have throw rugs and other things on the floor that can make you trip. What can I do in the kitchen? Clean up any spills right away. Avoid walking on wet floors. Keep items that you  use a lot in easy-to-reach places. If you need to reach something above you, use a strong step stool that has a grab bar. Keep electrical cords out of the way. Do not use floor polish or wax that makes floors slippery. If you must use wax, use non-skid floor wax. Do not have throw rugs and other things on the floor that can make you trip. What can I do with my stairs? Do not leave any items on the stairs. Make sure that there are handrails on both sides of the stairs and use them. Fix handrails that are broken or loose. Make sure that handrails are as long as the stairways. Check any carpeting to make sure that it is firmly attached to the stairs. Fix any carpet that is loose or worn. Avoid having throw rugs at the top or bottom of the stairs. If you do have throw rugs, attach them to the floor with carpet tape. Make sure that you have a light switch at the top of the stairs and the bottom of the stairs. If you do not have them, ask someone to add  them for you. What else can I do to help prevent falls? Wear shoes that: Do not have high heels. Have rubber bottoms. Are comfortable and fit you well. Are closed at the toe. Do not wear sandals. If you use a stepladder: Make sure that it is fully opened. Do not climb a closed stepladder. Make sure that both sides of the stepladder are locked into place. Ask someone to hold it for you, if possible. Clearly mark and make sure that you can see: Any grab bars or handrails. First and last steps. Where the edge of each step is. Use tools that help you move around (mobility aids) if they are needed. These include: Canes. Walkers. Scooters. Crutches. Turn on the lights when you go into a dark area. Replace any light bulbs as soon as they burn out. Set up your furniture so you have a clear path. Avoid moving your furniture around. If any of your floors are uneven, fix them. If there are any pets around you, be aware of where they are. Review your medicines with your doctor. Some medicines can make you feel dizzy. This can increase your chance of falling. Ask your doctor what other things that you can do to help prevent falls. This information is not intended to replace advice given to you by your health care provider. Make sure you discuss any questions you have with your health care provider. Document Released: 01/13/2009 Document Revised: 08/25/2015 Document Reviewed: 04/23/2014 Elsevier Interactive Patient Education  2017 [employer].

## 2022-01-19 NOTE — Progress Notes (Addendum)
I connected with  Victor Huber on 01/19/22 by a audio enabled telemedicine application and verified that I am speaking with the correct person using two identifiers.along with  wife Victor Huber   Patient Location: Home  Provider Location: Office/Clinic  I discussed the limitations of evaluation and management by telemedicine. The patient expressed understanding and agreed to proceed.   Subjective:   Victor Huber is a 71 y.o. male who presents for an Initial Medicare Annual Wellness Visit.  Review of Systems     Cardiac Risk Factors include: advanced age (>88men, >34 women);hypertension;dyslipidemia;male gender;obesity (BMI >30kg/m2);sedentary lifestyle     Objective:    Today's Vitals   01/19/22 1416  Weight: 234 lb (106.1 kg)   Body mass index is 30.87 kg/m.     01/19/2022    2:24 PM 04/13/2021   10:57 AM 01/11/2021    3:09 PM 09/05/2020    3:26 PM 06/30/2020    6:32 AM 05/29/2020    9:51 AM 05/23/2020    8:07 AM  Advanced Directives  Does Patient Have a Medical Advance Directive? No No No No No No No  Would patient like information on creating a medical advance directive? No - Patient declined No - Patient declined No - Patient declined  No - Patient declined No - Patient declined     Current Medications (verified) Outpatient Encounter Medications as of 01/19/2022  Medication Sig   acetaminophen (TYLENOL) 500 MG tablet Take 500 mg by mouth every 8 (eight) hours as needed for moderate pain.   albuterol (VENTOLIN HFA) 108 (90 Base) MCG/ACT inhaler INHALE 1 TO 2 PUFFS EVERY 4 HOURS AS NEEDED FOR WHEEZING  OR FOR SHORTNESS OF BREATH   atorvastatin (LIPITOR) 40 MG tablet TAKE 1 TABLET EVERY DAY   bisoprolol (ZEBETA) 5 MG tablet Take 1 tablet (5 mg total) by mouth daily.   diazepam (VALIUM) 5 MG tablet Take 1 tablet (5 mg total) by mouth every 12 (twelve) hours as needed for anxiety.   DULoxetine (CYMBALTA) 60 MG capsule TAKE 1 CAPSULE EVERY DAY   ELIQUIS 5 MG TABS  tablet TAKE 1 TABLET TWICE DAILY   esomeprazole (NEXIUM) 40 MG capsule TAKE 1 CAPSULE (40 MG TOTAL) BY MOUTH 2 (TWO) TIMES DAILY BEFORE A MEAL.   furosemide (LASIX) 20 MG tablet Take 2 tablets (40 mg total) by mouth daily for 2 days, THEN 1 tablet (20 mg total) daily.   gabapentin (NEURONTIN) 300 MG capsule Take 2 capsules (600 mg total) by mouth at bedtime as needed.   ibuprofen (ADVIL,MOTRIN) 400 MG tablet Take 400 mg by mouth every 4 (four) hours as needed for moderate pain.   levothyroxine (SYNTHROID) 200 MCG tablet TAKE 1 TABLET ONE TIME DAILY BEFORE BREAKFAST   lipase/protease/amylase (CREON) 36000 UNITS CPEP capsule Take 4 capsules po during each meal. Take 1-2 capsules po during each snack.( up to 2 snacks daily).   Oxycodone HCl 10 MG TABS Take 1 tablet (10 mg total) by mouth daily as needed (pain).   sucralfate (CARAFATE) 1 g tablet TAKE 1 TABLET FOUR TIMES DAILY WITH MEALS AND AT BEDTIME   tamsulosin (FLOMAX) 0.4 MG CAPS capsule Take 1 capsule (0.4 mg total) by mouth daily.   Tiotropium Bromide Monohydrate (SPIRIVA RESPIMAT) 2.5 MCG/ACT AERS Inhale 2 puffs into the lungs daily.   Facility-Administered Encounter Medications as of 01/19/2022  Medication   sodium chloride flush (NS) 0.9 % injection 10 mL    Allergies (verified) Carboplatin, Flecainide, and Debrox [carbamide  peroxide]   History: Past Medical History:  Diagnosis Date   Adenocarcinoma of left lung, stage 3 ([place]) 08/15/2016   Allergy    Anxiety    Arthritis    Atrial fibrillation ([place]) 08/15/2016   Atrial fibrillation ([facility])    Cancer, metastatic to bone [facility])    lung   Cataract    Waiting to schedule bilateral cataract surgery   CHF (congestive heart failure) ([place]) 01/04/2022   COPD (chronic obstructive pulmonary disease) ([place]) 08/15/2016   Depression    Dysphagia    Dysrhythmia    a-fib   GERD (gastroesophageal reflux disease)    Heart murmur    atenlol for A Fib/Eliquis   History of chemotherapy     History of radiation therapy    Hyperlipidemia    Hypertension    Hypothyroid 08/15/2016   Longstanding persistent atrial fibrillation ([place]) 08/29/2016   Pathologic fracture    left femur   Pneumonitis    S/P TURP 08/15/2016   Wears glasses    Wears hearing aid in both ears    Past Surgical History:  Procedure Laterality Date   BIOPSY  12/21/2019   Procedure: BIOPSY;  Surgeon: Irving Copas., MD;  Location: [place];  Service: Gastroenterology;;   BIOPSY  05/23/2020   Procedure: BIOPSY;  Surgeon: Irving Copas., MD;  Location: [facility];  Service: Gastroenterology;;   BRONCHOSCOPY  10/2014   CARDIAC CATHETERIZATION     05/07/12   CARDIOVERSION     x2   COLONOSCOPY     several yrs   DG BIOPSY LUNG Left 10/2014   FNA - Adenocarcinoma    ESOPHAGOGASTRODUODENOSCOPY (EGD) WITH PROPOFOL N/A 12/21/2019   Procedure: ESOPHAGOGASTRODUODENOSCOPY (EGD) WITH PROPOFOL;  Surgeon: Irving Copas., MD;  Location: [facility];  Service: Gastroenterology;  Laterality: N/A;   ESOPHAGOGASTRODUODENOSCOPY (EGD) WITH PROPOFOL N/A 05/23/2020   Procedure: ESOPHAGOGASTRODUODENOSCOPY (EGD) WITH PROPOFOL;  Surgeon: Rush Landmark Telford Nab., MD;  Location: [facility];  Service: Gastroenterology;  Laterality: N/A;   ESOPHAGOGASTRODUODENOSCOPY (EGD) WITH PROPOFOL N/A 06/30/2020   Procedure: ESOPHAGOGASTRODUODENOSCOPY (EGD) WITH PROPOFOL;  Surgeon: Rush Landmark Telford Nab., MD;  Location: [place];  Service: Gastroenterology;  Laterality: N/A;  ultra slim scope avail   EUS N/A 12/21/2019   Procedure: UPPER ENDOSCOPIC ULTRASOUND (EUS) RADIAL;  Surgeon: Irving Copas., MD;  Location: [place];  Service: Gastroenterology;  Laterality: N/A;   FEMUR IM NAIL Left 02/19/2017   FEMUR IM NAIL Left 02/19/2017   Procedure: INTRAMEDULLARY (IM) NAIL FEMORAL;  Surgeon: Marchia Bond, MD;  Location: [place];  Service: Orthopedics;  Laterality: Left;   IR FLUORO GUIDE PORT  INSERTION RIGHT  07/03/2017   IR US GUIDE VASC ACCESS RIGHT  07/03/2017   LUNG CANCER SURGERY Left 12/2014   Wedge Resection    MULTIPLE TOOTH EXTRACTIONS     SAVORY DILATION N/A 05/23/2020   Procedure: SAVORY DILATION;  Surgeon: Irving Copas., MD;  Location: [facility];  Service: Gastroenterology;  Laterality: N/A;   SAVORY DILATION N/A 06/30/2020   Procedure: SAVORY DILATION;  Surgeon: Rush Landmark Telford Nab., MD;  Location: [place];  Service: Gastroenterology;  Laterality: N/A;   Status post TURP     TONSILLECTOMY     UPPER GASTROINTESTINAL ENDOSCOPY     Family History  Problem Relation Age of Onset   Breast cancer Mother    Breast cancer Maternal Aunt    Breast cancer Maternal Aunt    Lung disease Neg Hx    Colon cancer Neg Hx  Stomach cancer Neg Hx    Pancreatic cancer Neg Hx    Rectal cancer Neg Hx    Colon polyps Neg Hx    Esophageal cancer Neg Hx    Social History   Socioeconomic History   Marital status: Married    Spouse name: Not on file   Number of children: Not on file   Years of education: Not on file   Highest education level: Not on file  Occupational History    Comment: retired   Tobacco Use   Smoking status: Former    Packs/day: 1.00    Years: 50.00    Total pack years: 50.00    Types: Cigarettes    Quit date: 08/15/2012    Years since quitting: 9.4   Smokeless tobacco: Never  Vaping Use   Vaping Use: Never used  Substance and Sexual Activity   Alcohol use: No   Drug use: No   Sexual activity: Not on file  Other Topics Concern   Not on file  Social History Narrative   [facility] (09/26/16):   Originally from [place]. Moved to [place] February 2018. Always lived in [place]. Moved to be closer to children & grandchildren. No international travel. 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 No mold exposure. No bird exposure or hot tub exposure. Enjoys reading.  Previously enjoyed wood working with domestic woods.    Social Determinants of Health   Financial Resource Strain: Low Risk  (01/19/2022)   Overall Financial Resource Strain (CARDIA)    Difficulty of Paying Living Expenses: Not hard at all  Food Insecurity: No Food Insecurity (01/19/2022)   Hunger Vital Sign    Worried About Running Out of Food in the Last Year: Never true    Ran Out of Food in the Last Year: Never true  Transportation Needs: No Transportation Needs (01/19/2022)   PRAPARE - Hydrologist (Medical): No    Lack of Transportation (Non-Medical): No  Physical Activity: Inactive (01/19/2022)   Exercise Vital Sign    Days of Exercise per Week: 0 days    Minutes of Exercise per Session: 0 min  Stress: No Stress Concern Present (01/19/2022)   [place]    Feeling of Stress : Not at all  Social Connections: Socially Isolated (01/19/2022)   Social Connection and Isolation Panel [NHANES]    Frequency of Communication with Friends and Family: Once a week    Frequency of Social Gatherings with Friends and Family: Once a week    Attends Religious Services: Never    Marine scientist or Organizations: No    Attends Music therapist: Never    Marital Status: Married    Tobacco Counseling Counseling given: Not Answered   Clinical Intake:  Pre-visit preparation completed: Yes  Pain : No/denies pain     BMI - recorded: 30.87 Nutritional Status: BMI > 30  Obese Nutritional Risks: None Diabetes: No  How often do you need to have someone help you when you read instructions, pamphlets, or other written materials from your doctor or pharmacy?: 1 - Never  Diabetic?no  Interpreter Needed?: No  Information entered by :: Charlott Rakes, LPN   Activities of Daily Living    01/19/2022    2:27 PM  In your present state of health, do you have any difficulty  performing the following activities:  Hearing? 0  Vision? 0  Difficulty concentrating or making decisions? 0  Walking or climbing stairs? 0  Dressing or bathing? 0  Doing errands, shopping? 0  Preparing Food and eating ? N  Using the Toilet? N  In the past six months, have you accidently leaked urine? N  Do you have problems with loss of bowel control? N  Managing your Medications? N  Managing your Finances? N  Housekeeping or managing your Housekeeping? N    Patient Care Team: [name], MD as PCP - General (Family Medicine) Skeet Latch, MD as PCP - Cardiology (Cardiology) Edythe Clarity, [facility] as Pharmacist (Pharmacist)  Indicate any recent Medical Services you may have received from other than [facility] providers in the past year (date may be approximate).     Assessment:   This is a routine wellness examination for Victor Huber.  Hearing/Vision screen Hearing Screening - Comments:: Pt wears hearing aids  Vision Screening - Comments:: Encouraged to follow up with provider   Dietary issues and exercise activities discussed: Current Exercise Habits: The patient does not participate in regular exercise at present   Goals Addressed             This Visit's Progress    Patient Stated       Patient Stated       Maintain good health        Depression Screen    01/19/2022    2:22 PM 09/05/2021    4:33 PM 09/05/2020    3:24 PM 07/01/2019    3:28 PM 03/10/2019    2:02 PM 01/05/2019    1:36 PM  PHQ 2/9 Scores  PHQ - 2 Score 6 6 0 0 0 0  PHQ- 9 Score 20 19        Fall Risk    01/19/2022    2:26 PM 09/05/2021    1:52 PM 09/05/2020    3:27 PM 03/08/2020    1:10 PM 07/06/2019    2:40 PM  Fall Risk   Falls in the past year? 1 0 0 1 0  Number falls in past yr: 1 0 0 1 1  Comment     hurt shoulder  Injury with Fall? 1 0 0 1 0  Comment left side rib pain      Risk for fall due to : No Fall Risks;Impaired vision No Fall Risks     Follow up Falls prevention discussed   Falls prevention discussed      FALL RISK PREVENTION PERTAINING TO THE HOME:  Any stairs in or around the home? No  If so, are there any without handrails? No  Home free of loose throw rugs in walkways, pet beds, electrical cords, etc? Yes  Adequate lighting in your home to reduce risk of falls? Yes   ASSISTIVE DEVICES UTILIZED TO PREVENT FALLS:  Life alert? No  Use of a cane, walker or w/c? No  Grab bars in the bathroom? Yes  Shower chair or bench in shower? Yes  Elevated toilet seat or a handicapped toilet? Yes   TIMED UP AND GO:  Was the test performed? No .   Cognitive Function:        01/19/2022    2:28 PM 09/05/2020    3:29 PM  6CIT Screen  What Year? 0 points 0 points  What month? 0 points 0 points  What time? 0 points 0 points  Count back from 20 0 points 0 points  Months in reverse 0 points 0  points  Repeat phrase 6 points 2 points  Total Score 6 points 2 points    Immunizations Immunization History  Administered Date(s) Administered   Fluad Quad(high Dose 65+) 03/10/2019   Influenza-Unspecified 01/16/2017, 02/01/2020, 01/23/2021   PFIZER(Purple Top)SARS-COV-2 Vaccination 05/11/2019, 06/05/2019, 11/19/2019   Pfizer Covid-19 Vaccine Bivalent Booster 18yrs & up 01/23/2021   Pneumococcal Conjugate-13 11/28/2016   Pneumococcal-Unspecified 09/27/2014    TDAP status: Due, Education has been provided regarding the importance of this vaccine. Advised may receive this vaccine at local pharmacy or Health Dept. Aware to provide a copy of the vaccination record if obtained from local pharmacy or Health Dept. Verbalized acceptance and understanding.  Flu Vaccine status: Due, Education has been provided regarding the importance of this vaccine. Advised may receive this vaccine at local pharmacy or Health Dept. Aware to provide a copy of the vaccination record if obtained from local pharmacy or Health Dept. Verbalized acceptance and understanding.  Pneumococcal vaccine  status: Due, Education has been provided regarding the importance of this vaccine. Advised may receive this vaccine at local pharmacy or Health Dept. Aware to provide a copy of the vaccination record if obtained from local pharmacy or Health Dept. Verbalized acceptance and understanding.  Covid-19 vaccine status: Completed vaccines  Qualifies for Shingles Vaccine? Yes   Zostavax completed No   Shingrix Completed?: No.    Education has been provided regarding the importance of this vaccine. Patient has been advised to call insurance company to determine out of pocket expense if they have not yet received this vaccine. Advised may also receive vaccine at local pharmacy or Health Dept. Verbalized acceptance and understanding.  Screening Tests Health Maintenance  Topic Date Due   Hepatitis C Screening  Never done   TETANUS/TDAP  Never done   Zoster Vaccines- Shingrix (1 of 2) Never done   Pneumonia Vaccine 65+ Years old (2 - PPSV23 or PCV20) 01/23/2017   COVID-19 Vaccine (5 - Pfizer risk series) 03/20/2021   INFLUENZA VACCINE  10/31/2021   COLONOSCOPY (Pts 45-42yrs Insurance coverage will need to be confirmed)  01/20/2023 (Originally 09/28/1995)   HPV VACCINES  Aged Out    Health Maintenance  Health Maintenance Due  Topic Date Due   Hepatitis C Screening  Never done   TETANUS/TDAP  Never done   Zoster Vaccines- Shingrix (1 of 2) Never done   Pneumonia Vaccine 57+ Years old (2 - PPSV23 or PCV20) 01/23/2017   COVID-19 Vaccine (5 - Pfizer risk series) 03/20/2021   INFLUENZA VACCINE  10/31/2021    Colorectal cancer screening: No longer required.    Additional Screening:  Hepatitis C Screening: does qualify;  Vision Screening: Recommended annual ophthalmology exams for early detection of glaucoma and other disorders of the eye. Is the patient up to date with their annual eye exam?  No  Who is the provider or what is the name of the office in which the patient attends annual eye exams?  Encouraged to follow up  If pt is not established with a provider, would they like to be referred to a provider to establish care? No .   Dental Screening: Recommended annual dental exams for proper oral hygiene  Community Resource Referral / Chronic Care Management: CRR required this visit?  No   CCM required this visit?  No      Plan:     I have personally reviewed and noted the following in the patient's chart:   Medical and social history Use of alcohol, tobacco or illicit  drugs  Current medications and supplements including opioid prescriptions. Patient is currently taking opioid prescriptions. Information provided to patient regarding non-opioid alternatives. Patient advised to discuss non-opioid treatment plan with their provider. Functional ability and status Nutritional status Physical activity Advanced directives List of other physicians Hospitalizations, surgeries, and ER visits in previous 12 months Vitals Screenings to include cognitive, depression, and falls Referrals and appointments  In addition, I have reviewed and discussed with patient certain preventive protocols, quality metrics, and best practice recommendations. A written personalized care plan for preventive services as well as general preventive health recommendations were provided to patient.     Willette Brace, LPN   02/33/4356   Nurse Notes: Pt is requesting to increase Cymbalta related to he doesn't feel it is working well, Also requesting eliquis be decreased to half related to cost of medication., pt also stated SOB upon exertion and that pulmonary and cardiologist have given test  and he wanted you to be aware he has no results please advise

## 2022-01-22 ENCOUNTER — Other Ambulatory Visit: Payer: Self-pay | Admitting: Family Medicine

## 2022-01-22 DIAGNOSIS — N401 Enlarged prostate with lower urinary tract symptoms: Secondary | ICD-10-CM

## 2022-02-02 ENCOUNTER — Ambulatory Visit (HOSPITAL_BASED_OUTPATIENT_CLINIC_OR_DEPARTMENT_OTHER): Payer: Medicare HMO | Admitting: Family

## 2022-02-02 ENCOUNTER — Encounter (HOSPITAL_BASED_OUTPATIENT_CLINIC_OR_DEPARTMENT_OTHER): Payer: Self-pay | Admitting: Family

## 2022-02-02 VITALS — BP 114/78 | HR 81 | Ht 73.0 in | Wt 235.0 lb

## 2022-02-02 DIAGNOSIS — C3490 Malignant neoplasm of unspecified part of unspecified bronchus or lung: Secondary | ICD-10-CM | POA: Diagnosis not present

## 2022-02-02 DIAGNOSIS — D6859 Other primary thrombophilia: Secondary | ICD-10-CM | POA: Diagnosis not present

## 2022-02-02 DIAGNOSIS — R0609 Other forms of dyspnea: Secondary | ICD-10-CM

## 2022-02-02 DIAGNOSIS — J449 Chronic obstructive pulmonary disease, unspecified: Secondary | ICD-10-CM | POA: Diagnosis not present

## 2022-02-02 DIAGNOSIS — I5020 Unspecified systolic (congestive) heart failure: Secondary | ICD-10-CM | POA: Diagnosis not present

## 2022-02-02 DIAGNOSIS — I482 Chronic atrial fibrillation, unspecified: Secondary | ICD-10-CM

## 2022-02-02 MED ORDER — ENTRESTO 24-26 MG PO TABS: 1.0000 | ORAL_TABLET | Freq: Two times a day (BID) | ORAL | 5 refills | Status: DC

## 2022-02-02 NOTE — Patient Instructions (Signed)
Medication Instructions:  Your physician has recommended you make the following change in your medication:   START Entresto 24-26mg  twice daily   *If you need a refill on your cardiac medications before your next appointment, please call your pharmacy*  Lab Work/Testing/Procedures: None ordered today.   Follow-Up: At [facility], you and your health needs are our priority.  As part of our continuing mission to provide you with exceptional heart care, we have created designated Provider Care Teams.  These Care Teams include your primary Cardiologist (physician) and Advanced Practice Providers (APPs -  Physician Assistants and Nurse Practitioners) who all work together to provide you with the care you need, when you need it.  We recommend signing up for the patient portal called "MyChart".  Sign up information is provided on this After Visit Summary.  MyChart is used to connect with patients for Virtual Visits (Telemedicine).  Patients are able to view lab/test results, encounter notes, upcoming appointments, etc.  Non-urgent messages can be sent to your provider as well.   To learn more about what you can do with MyChart, go to [email or web address].    Your next appointment:   3-4 month(s)  The format for your next appointment:   In Person  Provider:   Skeet Latch, MD    Other Instructions  Heart Healthy Diet Recommendations: A low-salt diet is recommended. Meats should be grilled, baked, or boiled. Avoid fried foods. Focus on lean protein sources like fish or chicken with vegetables and fruits. The American Heart Association is a Microbiologist!  American Heart Association Diet and Lifeystyle Recommendations   Exercise recommendations: The American Heart Association recommends 150 minutes of moderate intensity exercise weekly. Try 30 minutes of moderate intensity exercise 4-5 times per week. This could include walking, jogging, or swimming.

## 2022-02-02 NOTE — Progress Notes (Unsigned)
Office Visit    Patient Name: Victor Huber Date of Encounter: 02/03/2022  PCP:  Victor Barrack, MD   [place]  Cardiologist:  Victor Latch, MD  Advanced Practice Provider:  No care team member to display Electrophysiologist:  None      Chief Complaint    Victor Huber is a 71 y.o. male presents today for follow up after transition to Bisoprolol   Past Medical History    Past Medical History:  Diagnosis Date   Adenocarcinoma of left lung, stage 3 ([place]) 08/15/2016   Allergy    Anxiety    Arthritis    Atrial fibrillation ([place]) 08/15/2016   Atrial fibrillation ([place])    Cancer, metastatic to bone [place] [facility])    lung   Cataract    Waiting to schedule bilateral cataract surgery   CHF (congestive heart failure) ([place]) 01/04/2022   COPD (chronic obstructive pulmonary disease) ([place]) 08/15/2016   Depression    Dysphagia    Dysrhythmia    a-fib   GERD (gastroesophageal reflux disease)    Heart murmur    atenlol for A Fib/Eliquis   History of chemotherapy    History of radiation therapy    Hyperlipidemia    Hypertension    Hypothyroid 08/15/2016   Longstanding persistent atrial fibrillation ([place]) 08/29/2016   Pathologic fracture    left femur   Pneumonitis    S/P TURP 08/15/2016   Wears glasses    Wears hearing aid in both ears    Past Surgical History:  Procedure Laterality Date   BIOPSY  12/21/2019   Procedure: BIOPSY;  Surgeon: Victor Huber., MD;  Location: [place];  Service: Gastroenterology;;   BIOPSY  05/23/2020   Procedure: BIOPSY;  Surgeon: Victor Huber., MD;  Location: [facility];  Service: Gastroenterology;;   BRONCHOSCOPY  10/2014   CARDIAC CATHETERIZATION     05/07/12   CARDIOVERSION     x2   COLONOSCOPY     several yrs   DG BIOPSY LUNG Left 10/2014   FNA - Adenocarcinoma    ESOPHAGOGASTRODUODENOSCOPY (EGD) WITH PROPOFOL N/A 12/21/2019   Procedure: ESOPHAGOGASTRODUODENOSCOPY (EGD) WITH  PROPOFOL;  Surgeon: Victor Huber., MD;  Location: [facility];  Service: Gastroenterology;  Laterality: N/A;   ESOPHAGOGASTRODUODENOSCOPY (EGD) WITH PROPOFOL N/A 05/23/2020   Procedure: ESOPHAGOGASTRODUODENOSCOPY (EGD) WITH PROPOFOL;  Surgeon: Victor Landmark Telford Nab., MD;  Location: [facility];  Service: Gastroenterology;  Laterality: N/A;   ESOPHAGOGASTRODUODENOSCOPY (EGD) WITH PROPOFOL N/A 06/30/2020   Procedure: ESOPHAGOGASTRODUODENOSCOPY (EGD) WITH PROPOFOL;  Surgeon: Victor Landmark Telford Nab., MD;  Location: [place];  Service: Gastroenterology;  Laterality: N/A;  ultra slim scope avail   EUS N/A 12/21/2019   Procedure: UPPER ENDOSCOPIC ULTRASOUND (EUS) RADIAL;  Surgeon: Victor Huber., MD;  Location: [place];  Service: Gastroenterology;  Laterality: N/A;   FEMUR IM NAIL Left 02/19/2017   FEMUR IM NAIL Left 02/19/2017   Procedure: INTRAMEDULLARY (IM) NAIL FEMORAL;  Surgeon: Victor Bond, MD;  Location: [place];  Service: Orthopedics;  Laterality: Left;   IR FLUORO GUIDE PORT INSERTION RIGHT  07/03/2017   IR US GUIDE VASC ACCESS RIGHT  07/03/2017   LUNG CANCER SURGERY Left 12/2014   Wedge Resection    MULTIPLE TOOTH EXTRACTIONS     SAVORY DILATION N/A 05/23/2020   Procedure: SAVORY DILATION;  Surgeon: Victor Huber., MD;  Location: [facility];  Service: Gastroenterology;  Laterality: N/A;   SAVORY DILATION N/A 06/30/2020   Procedure: SAVORY DILATION;  Surgeon: Victor Huber., MD;  Location: [place];  Service: Gastroenterology;  Laterality: N/A;   Status post TURP     TONSILLECTOMY     UPPER GASTROINTESTINAL ENDOSCOPY      Allergies  Allergies  Allergen Reactions   Carboplatin Itching, Nausea And Vomiting and Other (See Comments)    Flushing   Flecainide Hypertension    CAUSED HEART ISSUES    Debrox [Carbamide Peroxide]     Swelling in ear canal.     History of Present Illness    Victor Huber is a 71 y.o. male with a  hx of atrial fibrillation, COPD, hypothyroidism, metastatic non-small cell cancer s/p resection on chemotherapy, prior tobacco use last seen 11/20/21.  Previously lived in [place].  Diagnosed with atrial fibrillation in 2012.  05/2012 LHC at [facility] with no coronary atherosclerosis. Previously on amiodarone but discontinued when diagnosed with lung cancer in 2016.  Switched to sotalol but failed subsequent DCCV.  Tried flecainide but developed a WCT and subsequent cardiac arrest.  Metoprolol switched to atenolol due to previously feeling better on the medication.  He was referred to atrial fibrillation clinic and not felt to be a good candidate for ablation and declined antiarrhythmics due to cost.Nuclear stress test 12/2019 multiple artifacts no ischemia.  Seen 11/20/21 by Victor Huber noting dyspnea - CBC without anemia, CMP normal kidney function though mild elevation alkaline phosphatase.  TSH low but T3/T4 normal. HR not at goal - Atenolol switched to Metoprolol. Recommended to follow-up with primary care.  Echocardiogram 12/07/2021 with LVEF 40-45%, LV global hypokinesis, mild LVH, RV SF mildly reduced. (Prior 2017 EF 45-50%)  At follow up 12/21/21 he was transitioned from Metoprolol to Bisoprolol as he felt Metoprolol was making his breathing worse.   Presents today for follow up. Weight down 4 pounds compared to last clinic visit. Tells me he feels about the same. Feels about the same since switching from Metoprolol to Bisoprolol feels about the same - no worse. Still with significant exertional dyspnea with minimal activity. He is understandably frustrated regarding symptoms and we discussed that dyspnea is likely multifactorial heart and lung etiology.   EKGs/Labs/Other Studies Reviewed:   The following studies were reviewed today: Echo 12/07/21   1. Techinically difficult study. Left ventricular ejection fraction, by  estimation, is 40 to 45%. The left ventricle has mildly  decreased  function. The left ventricle demonstrates global hypokinesis. There is  mild left ventricular hypertrophy. Left  ventricular diastolic parameters are indeterminate.   2. Right ventricular systolic function is mildly reduced. The right  ventricular size is moderately enlarged. Tricuspid regurgitation signal is  inadequate for assessing PA pressure.   3. The mitral valve is normal in structure. Trivial mitral valve  regurgitation. No evidence of mitral stenosis.   4. The aortic valve was not well visualized. Aortic valve regurgitation  is not visualized. Aortic valve sclerosis/calcification is present,  without any evidence of aortic stenosis.   5. The inferior vena cava is normal in size with greater than 50%  respiratory variability, suggesting right atrial pressure of 3 mmHg.   EKG:  No EKG today.   Recent Labs: 11/20/2021: ALT 17; BUN 16; Creatinine, Ser 0.93; Hemoglobin 16.2; Platelets 169; Potassium 4.6; Sodium 136 12/21/2021: TSH 0.640  Recent Lipid Panel    Component Value Date/Time   CHOL 122 03/06/2019 0956   CHOL 115 12/19/2016 0933   TRIG 164.0 (H) 09/10/2019 1640   HDL 35.90 (L) 03/06/2019 9379  HDL 33 (L) 12/19/2016 0933   CHOLHDL 3 03/06/2019 0956   VLDL 40.6 (H) 03/06/2019 0956   LDLCALC 34 12/19/2016 0933   LDLDIRECT 55.0 03/06/2019 0956    Risk Assessment/Calculations:   CHA2DS2-VASc Score = 3   This indicates a 3.2% annual risk of stroke. The patient's score is based upon: CHF History: 1 HTN History: 0 Diabetes History: 0 Stroke History: 0 Vascular Disease History: 1 Age Score: 1 Gender Score: 0     Home Medications   Current Meds  Medication Sig   acetaminophen (TYLENOL) 500 MG tablet Take 500 mg by mouth every 8 (eight) hours as needed for moderate pain.   atorvastatin (LIPITOR) 40 MG tablet TAKE 1 TABLET EVERY DAY   bisoprolol (ZEBETA) 5 MG tablet Take 1 tablet (5 mg total) by mouth daily.   diazepam (VALIUM) 5 MG tablet Take 1  tablet (5 mg total) by mouth every 12 (twelve) hours as needed for anxiety.   DULoxetine (CYMBALTA) 60 MG capsule TAKE 1 CAPSULE EVERY DAY   ELIQUIS 5 MG TABS tablet TAKE 1 TABLET TWICE DAILY   esomeprazole (NEXIUM) 40 MG capsule TAKE 1 CAPSULE (40 MG TOTAL) BY MOUTH 2 (TWO) TIMES DAILY BEFORE A MEAL.   furosemide (LASIX) 20 MG tablet Take 2 tablets (40 mg total) by mouth daily for 2 days, THEN 1 tablet (20 mg total) daily.   gabapentin (NEURONTIN) 300 MG capsule Take 2 capsules (600 mg total) by mouth at bedtime as needed.   ibuprofen (ADVIL,MOTRIN) 400 MG tablet Take 400 mg by mouth every 4 (four) hours as needed for moderate pain.   levothyroxine (SYNTHROID) 200 MCG tablet TAKE 1 TABLET ONE TIME DAILY BEFORE BREAKFAST   lipase/protease/amylase (CREON) 36000 UNITS CPEP capsule Take 4 capsules po during each meal. Take 1-2 capsules po during each snack.( up to 2 snacks daily).   sacubitril-valsartan (ENTRESTO) 24-26 MG Take 1 tablet by mouth 2 (two) times daily.   sucralfate (CARAFATE) 1 g tablet TAKE 1 TABLET FOUR TIMES DAILY WITH MEALS AND AT BEDTIME   tamsulosin (FLOMAX) 0.4 MG CAPS capsule TAKE 1 CAPSULE EVERY DAY   Tiotropium Bromide Monohydrate (SPIRIVA RESPIMAT) 2.5 MCG/ACT AERS Inhale 2 puffs into the lungs daily.     Review of Systems      All other systems reviewed and are otherwise negative except as noted above.  Physical Exam    VS:  BP 114/78   Pulse 81   Ht 6\' 1"  (1.854 m)   Wt 235 lb (106.6 kg)   BMI 31.00 kg/m  , BMI Body mass index is 31 kg/m.  Wt Readings from Last 3 Encounters:  02/02/22 235 lb (106.6 kg)  01/19/22 234 lb (106.1 kg)  01/04/22 234 lb 6.4 oz (106.3 kg)     GEN: Well nourished, overweight,well developed, in no acute distress. HEENT: normal. Neck: Supple, no JVD, carotid bruits, or masses. Cardiac: IRIR, no murmurs, rubs, or gallops. No clubbing, cyanosis, edema.  Radials/PT 2+ and equal bilaterally.  Respiratory:  Respirations regular and  unlabored, clear to auscultation bilaterally. GI: Soft, nontender, nondistended. MS: No deformity or atrophy. Skin: Warm and dry, no rash. Neuro:  Strength and sensation are intact. Psych: Normal affect.  Assessment & Plan    HFmrEF - 12/2021 EF 40-45%, RWMA (prior 2017 45-50%). Weight down since last visit.  GDMT Bisoprolol, Lasix. He is understandably hesitant regarding additional medications but agreeable to trial Entresto 24-26mg  BID. Samples and free 30 day card provided. Future considerations  include  MRA, SGLT2i.  We will have to be cautious to avoid hypotension. Low sodium diet, fluid restriction <2L, and daily weights encouraged. Educated to contact our office for weight gain of 2 lbs overnight or 5 lbs in one week.   Chronic atrial fibrillation / Hypercoagulable state - CHA2DS2-VASc Score = 3 [CHF History: 1, HTN History: 0, Diabetes History: 0, Stroke History: 0, Vascular Disease History: 1, Age Score: 1, Gender Score: 0].  Therefore, the patient's annual risk of stroke is 3.2 %.    Continue Bisoprolol, Eliquis. Denies bleeding complications. 1 week sample of Eliquis provided in clinic as he is in donut hole and it is becoming expensive. Patient assistance paperwork provided for him to fill out.   COPD / Metastatic lung adenocarcinoma - FOllows with pulmonology. CT scan upcoming for monitoring.   Hypothyroidism - Continue to follow with PCP.         Disposition: Follow up in 3-4 month(s)  with Victor Latch, MD or APP.  Signed, [name], NP 02/03/2022, 9:40 AM [place]

## 2022-02-03 ENCOUNTER — Encounter (HOSPITAL_BASED_OUTPATIENT_CLINIC_OR_DEPARTMENT_OTHER): Payer: Self-pay | Admitting: Family

## 2022-02-03 ENCOUNTER — Other Ambulatory Visit: Payer: Self-pay

## 2022-02-05 ENCOUNTER — Other Ambulatory Visit: Payer: Self-pay | Admitting: Gastroenterology

## 2022-02-05 ENCOUNTER — Telehealth (HOSPITAL_BASED_OUTPATIENT_CLINIC_OR_DEPARTMENT_OTHER): Payer: Self-pay

## 2022-02-05 ENCOUNTER — Other Ambulatory Visit: Payer: Self-pay | Admitting: Family Medicine

## 2022-02-05 NOTE — Telephone Encounter (Signed)
ENTRESTO PRIOR AUTH STARTED VIA COVER MY MEDS,   PER COVER MY MEDS, ENTRESTO PRIOR AUTH NOT REQUIRED.  PATIENT UPDATED VIA MYCHART MESSAGE.

## 2022-02-09 ENCOUNTER — Inpatient Hospital Stay: Payer: Medicare HMO | Attending: Internal Medicine

## 2022-02-09 ENCOUNTER — Ambulatory Visit (HOSPITAL_COMMUNITY)
Admission: RE | Admit: 2022-02-09 | Discharge: 2022-02-09 | Disposition: A | Discharge status: Home / Self Care | Payer: Medicare HMO | Source: Ambulatory Visit | Attending: Internal Medicine | Admitting: Internal Medicine

## 2022-02-09 ENCOUNTER — Other Ambulatory Visit: Payer: Self-pay

## 2022-02-09 DIAGNOSIS — Z452 Encounter for adjustment and management of vascular access device: Secondary | ICD-10-CM | POA: Diagnosis not present

## 2022-02-09 DIAGNOSIS — Z95828 Presence of other vascular implants and grafts: Secondary | ICD-10-CM

## 2022-02-09 DIAGNOSIS — C3490 Malignant neoplasm of unspecified part of unspecified bronchus or lung: Secondary | ICD-10-CM

## 2022-02-09 DIAGNOSIS — J439 Emphysema, unspecified: Secondary | ICD-10-CM | POA: Diagnosis not present

## 2022-02-09 DIAGNOSIS — C3432 Malignant neoplasm of lower lobe, left bronchus or lung: Secondary | ICD-10-CM | POA: Diagnosis not present

## 2022-02-09 LAB — CBC WITH DIFFERENTIAL (CANCER CENTER ONLY)
Abs Immature Granulocytes: 0.01 10*3/uL (ref 0.00–0.07)
Basophils Absolute: 0 10*3/uL (ref 0.0–0.1)
Basophils Relative: 0 %
Eosinophils Absolute: 0.1 10*3/uL (ref 0.0–0.5)
Eosinophils Relative: 1 %
HCT: 42.6 % (ref 39.0–52.0)
Hemoglobin: 15.3 g/dL (ref 13.0–17.0)
Immature Granulocytes: 0 %
Lymphocytes Relative: 21 %
Lymphs Abs: 1.4 10*3/uL (ref 0.7–4.0)
MCH: 33.7 pg (ref 26.0–34.0)
MCHC: 35.9 g/dL (ref 30.0–36.0)
MCV: 93.8 fL (ref 80.0–100.0)
Monocytes Absolute: 0.5 10*3/uL (ref 0.1–1.0)
Monocytes Relative: 7 %
Neutro Abs: 4.8 10*3/uL (ref 1.7–7.7)
Neutrophils Relative %: 71 %
Platelet Count: 166 10*3/uL (ref 150–400)
RBC: 4.54 MIL/uL (ref 4.22–5.81)
RDW: 13.3 % (ref 11.5–15.5)
WBC Count: 6.8 10*3/uL (ref 4.0–10.5)
nRBC: 0 % (ref 0.0–0.2)

## 2022-02-09 LAB — CMP (CANCER CENTER ONLY)
ALT: 14 U/L (ref 0–44)
AST: 19 U/L (ref 15–41)
Albumin: 4 g/dL (ref 3.5–5.0)
Alkaline Phosphatase: 95 U/L (ref 38–126)
Anion gap: 4 — ABNORMAL LOW (ref 5–15)
BUN: 13 mg/dL (ref 8–23)
CO2: 27 mmol/L (ref 22–32)
Calcium: 8.8 mg/dL — ABNORMAL LOW (ref 8.9–10.3)
Chloride: 105 mmol/L (ref 98–111)
Creatinine: 0.86 mg/dL (ref 0.61–1.24)
GFR, Estimated: >60 mL/min (ref >60)
Glucose, Bld: 254 mg/dL — ABNORMAL HIGH (ref 70–99)
Potassium: 4.7 mmol/L (ref 3.5–5.1)
Sodium: 136 mmol/L (ref 135–145)
Total Bilirubin: 0.8 mg/dL (ref 0.3–1.2)
Total Protein: 6.8 g/dL (ref 6.5–8.1)

## 2022-02-09 MED ORDER — SODIUM CHLORIDE (PF) 0.9 % IJ SOLN
INTRAMUSCULAR | Status: AC
  Filled 2022-02-09: qty 50

## 2022-02-09 MED ORDER — HEPARIN SOD (PORK) LOCK FLUSH 100 UNIT/ML IV SOLN
INTRAVENOUS | Status: AC
  Filled 2022-02-09: qty 5

## 2022-02-09 MED ORDER — HEPARIN SOD (PORK) LOCK FLUSH 100 UNIT/ML IV SOLN
500.0000 [IU] | Freq: Once | INTRAVENOUS | Status: AC
  Administered 2022-02-09: 500 [IU] via INTRAVENOUS

## 2022-02-09 MED ORDER — IOHEXOL 9 MG/ML PO SOLN
500.0000 mL | ORAL | Status: AC
  Administered 2022-02-09: 1000 mL via ORAL

## 2022-02-09 MED ORDER — IOHEXOL 300 MG/ML  SOLN
100.0000 mL | Freq: Once | INTRAMUSCULAR | Status: AC | PRN
  Administered 2022-02-09: 100 mL via INTRAVENOUS

## 2022-02-09 MED ORDER — IOHEXOL 9 MG/ML PO SOLN
ORAL | Status: AC
  Filled 2022-02-09: qty 1000

## 2022-02-09 MED ORDER — SODIUM CHLORIDE 0.9% FLUSH
10.0000 mL | INTRAVENOUS | Status: DC | PRN
  Administered 2022-02-09: 10 mL

## 2022-02-11 ENCOUNTER — Encounter: Payer: Self-pay | Admitting: Internal Medicine

## 2022-02-12 ENCOUNTER — Encounter: Payer: Self-pay | Admitting: Medical Oncology

## 2022-02-12 ENCOUNTER — Telehealth (HOSPITAL_BASED_OUTPATIENT_CLINIC_OR_DEPARTMENT_OTHER): Payer: Self-pay

## 2022-02-12 DIAGNOSIS — I5020 Unspecified systolic (congestive) heart failure: Secondary | ICD-10-CM

## 2022-02-12 MED ORDER — BISOPROLOL FUMARATE 5 MG PO TABS: 5.0000 mg | ORAL_TABLET | Freq: Every day | ORAL | 2 refills | Status: DC

## 2022-02-12 NOTE — Telephone Encounter (Signed)
Received fax from [facility] requesting refills for Bisoprolol. Rx request sent to pharmacy.

## 2022-02-16 ENCOUNTER — Other Ambulatory Visit: Payer: Self-pay | Admitting: Physician Assistant

## 2022-02-16 ENCOUNTER — Encounter (HOSPITAL_BASED_OUTPATIENT_CLINIC_OR_DEPARTMENT_OTHER): Payer: Self-pay

## 2022-02-16 NOTE — Telephone Encounter (Signed)
Not much change with Victor Huber would you like to continue?

## 2022-02-19 ENCOUNTER — Inpatient Hospital Stay (HOSPITAL_BASED_OUTPATIENT_CLINIC_OR_DEPARTMENT_OTHER): Payer: Medicare HMO | Admitting: Internal Medicine

## 2022-02-19 DIAGNOSIS — C3490 Malignant neoplasm of unspecified part of unspecified bronchus or lung: Secondary | ICD-10-CM | POA: Diagnosis not present

## 2022-02-19 NOTE — Progress Notes (Signed)
[place] [phone]   Fax:([phone]  PROGRESS NOTE FOR TELEMEDICINE VISITS  Victor Huber, [place] [address]  I connected withNAME@ on 02/19/22 at 10:15 AM EST by telephone visit and verified that I am speaking with the correct person using two identifiers.   I discussed the limitations, risks, security and privacy concerns of performing an evaluation and management service by telemedicine and the availability of in-person appointments. I also discussed with the patient that there may be a patient responsible charge related to this service. The patient expressed understanding and agreed to proceed.  Other persons participating in the visit and their role in the encounter: Wife  Patient's location: Home Provider's location: [place]  DIAGNOSIS: Metastatic non-small cell lung cancer initially diagnosed as stage IIIA (T2a, N2, M0) non-small cell lung cancer, poorly differentiated adenocarcinoma presented with left lower lobe lung mass in addition to mediastinal lymphadenopathy.  The patient was diagnosed with metastatic disease involving the left femur as well as left supraclavicular nodal metastases and right paratracheal lymphadenopathy in October 2018.   Biomarker Findings Microsatellite Status - MS-Stable Tumor Mutational Burden - TMB-Low (3 Muts/Mb) Genomic Findings For a complete list of the genes assayed, please refer to the Appendix. STK11 P225f*6 [id number] site 1176+1Y>WDAXX E374* MLL2 [id number] NBN K23350f NOTCH2 [id number] splice site [id number]>NDisease relevant genes with no reportable alterations: EGFR, KRAS, ALK, BRAF, MET, RET, ERBB2, ROS1    PDL1 expression 5%   PRIOR THERAPY: 1) status post wedge resection of the left lower lobe lung mass as well as AP window lymph node dissection but there was residual metastatic mediastinal lymphadenopathy that could not be resected. 2) a  course of concurrent chemoradiation [place] carbopl[place]  3) status post palliative radiotherapy to the left femur metastatic bone disease. 4)  Systemic chemotherapy with carboplatin for AUC of 5, Alimta 500 mg/M2 and Keytruda 200 mg IV every 3 weeks.  First dose March 07, 2017.  Carboplatin was discontinued during cycle #2 secondary to hypersensitivity reaction. 5) status post 2 cycles of maintenance treatment with Alimta and Ketruda (pembrolizumab).  Alimta was discontinued secondary to intolerance.   CURRENT THERAPY: Maintenance treatment with single agent Ketruda (pembrolizumab) status post 49  cycles.  His treatment has been on hold since January 2022 secondary to intolerance.  INTERVAL HISTORY: Victor Huber 71. male has a telephone virtual visit with me today for evaluation and discussion of his recent scan results.  The patient is feeling fine today with no concerning complaints except for the shortness of breath at baseline increased with exertion and he is followed by Dr. ByrLamonte Sakair his COPD.  He denied having any current chest pain, cough or hemoptysis.  He has no nausea, vomiting, diarrhea or constipation.  He has no headache or visual changes.  He has no recent weight loss or night sweats.  The patient is currently on observation.  He had repeat CT scan of the chest, abdomen and pelvis performed recently and he is here for evaluation and discussion of his scan results.  MEDICAL HISTORY: Past Medical History:  Diagnosis Date   Aden[place] of left lung, stage 3 [place]   Allergy    Anxiety    Arthritis    Atrial fibrillation ([facility]   Atrial fibrillation (HCC)    [facility]   Cataract  Waiting to schedule bilateral cataract surgery   CHF (congestive heart failure) ([place]) 01/04/2022   COPD (chronic obstructive pulmonary disease) ([place]) 08/15/2016   Depression    Dysphagia     Dysrhythmia    a-fib   GERD (gastroesophageal reflux disease)    Heart murmur    atenlol for A Fib/Eliquis   History of chemotherapy    History of radiation therapy    Hyperlipidemia    Hypertension    Hypothyroid 08/15/2016   Longstanding persistent atrial fibrillation ([place]) 08/29/2016   Pathologic fracture    left femur   Pneumonitis    S/P TURP 08/15/2016   Wears glasses    Wears hearing aid in both ears     ALLERGIES:  is allergic to carboplatin, flecainide, and debrox [carbamide peroxide].  MEDICATIONS:  Current Outpatient Medications  Medication Sig Dispense Refill   acetaminophen (TYLENOL) 500 MG tablet Take 500 mg by mouth every 8 (eight) hours as needed for moderate pain.     atorvastatin (LIPITOR) 40 MG tablet TAKE 1 TABLET EVERY DAY 90 tablet 0   bisoprolol (ZEBETA) 5 MG tablet Take 1 tablet (5 mg total) by mouth daily. 90 tablet 2   diazepam (VALIUM) 5 MG tablet Take 1 tablet (5 mg total) by mouth every 12 (twelve) hours as needed for anxiety. 30 tablet 1   DULoxetine (CYMBALTA) 60 MG capsule TAKE 1 CAPSULE EVERY DAY 90 capsule 0   ELIQUIS 5 MG TABS tablet TAKE 1 TABLET TWICE DAILY 180 tablet 1   esomeprazole (NEXIUM) 40 MG capsule TAKE 1 CAPSULE (40 MG TOTAL) BY MOUTH 2 (TWO) TIMES DAILY BEFORE A MEAL. 180 capsule 10   furosemide (LASIX) 20 MG tablet Take 2 tablets (40 mg total) by mouth daily for 2 days, THEN 1 tablet (20 mg total) daily. 32 tablet 0   gabapentin (NEURONTIN) 300 MG capsule Take 2 capsules (600 mg total) by mouth at bedtime as needed. 180 capsule 3   ibuprofen (ADVIL,MOTRIN) 400 MG tablet Take 400 mg by mouth every 4 (four) hours as needed for moderate pain.     levothyroxine (SYNTHROID) 200 MCG tablet TAKE 1 TABLET ONE TIME DAILY BEFORE BREAKFAST 90 tablet 10   lipase/protease/amylase (CREON) 36000 UNITS CPEP capsule Take 4 capsules po during each meal. Take 1-2 capsules po during each snack.( up to 2 snacks daily). 480 capsule 3    sacubitril-valsartan (ENTRESTO) 24-26 MG Take 1 tablet by mouth 2 (two) times daily. 60 tablet 5   sucralfate (CARAFATE) 1 g tablet TAKE 1 TABLET FOUR TIMES DAILY WITH MEALS AND AT BEDTIME 360 tablet 1   tamsulosin (FLOMAX) 0.4 MG CAPS capsule TAKE 1 CAPSULE EVERY DAY 90 capsule 10   Tiotropium Bromide Monohydrate (SPIRIVA RESPIMAT) 2.5 MCG/ACT AERS Inhale 2 puffs into the lungs daily. 4 g 5   No current facility-administered medications for this visit.   Facility-Administered Medications Ordered in Other Visits  Medication Dose Route Frequency Provider Last Rate Last Admin   sodium chloride flush (NS) 0.9 % injection 10 mL  10 mL Intracatheter PRN Curt Bears, MD   10 mL at 10/13/20 1028    SURGICAL HISTORY:  Past Surgical History:  Procedure Laterality Date   BIOPSY  12/21/2019   Procedure: BIOPSY;  Surgeon: Irving Copas., MD;  Location: [facility];  Service: Gastroenterology;;   BIOPSY  05/23/2020   Procedure: BIOPSY;  Surgeon: Irving Copas., MD;  Location: [facility];  Service: Gastroenterology;;   [facility]  10/2014  CARDIAC CATHETERIZATION     05/07/12   CARDIOVERSION     x2   COLONOSCOPY     several yrs   DG BIOPSY LUNG Left 10/2014   FNA - Adenocarcinoma    ESOPHAGOGASTRODUODENOSCOPY (EGD) WITH PROPOFOL N/A 12/21/2019   Procedure: ESOPHAGOGASTRODUODENOSCOPY (EGD) WITH PROPOFOL;  Surgeon: Irving Copas., MD;  Location: [facility];  Service: Gastroenterology;  Laterality: N/A;   ESOPHAGOGASTRODUODENOSCOPY (EGD) WITH PROPOFOL N/A 05/23/2020   Procedure: ESOPHAGOGASTRODUODENOSCOPY (EGD) WITH PROPOFOL;  Surgeon: Rush Landmark Telford Nab., MD;  Location: [facility];  Service: Gastroenterology;  Laterality: N/A;   ESOPHAGOGASTRODUODENOSCOPY (EGD) WITH PROPOFOL N/A 06/30/2020   Procedure: ESOPHAGOGASTRODUODENOSCOPY (EGD) WITH PROPOFOL;  Surgeon: Rush Landmark Telford Nab., MD;  Location: [facility];  Service: Gastroenterology;  Laterality:  N/A;  ultra slim scope avail   EUS N/A 12/21/2019   Procedure: UPPER ENDOSCOPIC ULTRASOUND (EUS) RADIAL;  Surgeon: Irving Copas., MD;  Location: [facility];  Service: Gastroenterology;  Laterality: N/A;   FEMUR IM NAIL Left 02/19/2017   FEMUR IM NAIL Left 02/19/2017   Procedure: INTRAMEDULLARY (IM) NAIL FEMORAL;  Surgeon: Marchia Bond, MD;  Location: [facility];  Service: Orthopedics;  Laterality: Left;   IR FLUORO GUIDE PORT INSERTION RIGHT  07/03/2017   IR US GUIDE VASC ACCESS RIGHT  07/03/2017   LUNG CANCER SURGERY Left 12/2014   Wedge Resection    MULTIPLE TOOTH EXTRACTIONS     SAVORY DILATION N/A 05/23/2020   Procedure: SAVORY DILATION;  Surgeon: Irving Copas., MD;  Location: [facility];  Service: Gastroenterology;  Laterality: N/A;   SAVORY DILATION N/A 06/30/2020   Procedure: SAVORY DILATION;  Surgeon: Rush Landmark Telford Nab., MD;  Location: [facility];  Service: Gastroenterology;  Laterality: N/A;   Status post TURP     TONSILLECTOMY     UPPER GASTROINTESTINAL ENDOSCOPY      REVIEW OF SYSTEMS:  A comprehensive review of systems was negative except for: Constitutional: positive for fatigue Respiratory: positive for dyspnea on exertion    LABORATORY DATA: Lab Results  Component Value Date   WBC 6.8 02/09/2022   HGB 15.3 02/09/2022   HCT 42.6 02/09/2022   MCV 93.8 02/09/2022   PLT 166 02/09/2022      Chemistry      Component Value Date/Time   NA 136 02/09/2022 1231   NA 136 11/20/2021 1621   NA 136 04/04/2017 1141   K 4.7 02/09/2022 1231   K 4.4 04/04/2017 1141   CL 105 02/09/2022 1231   CO2 27 02/09/2022 1231   CO2 24 04/04/2017 1141   BUN 13 02/09/2022 1231   BUN 16 11/20/2021 1621   BUN 21.1 04/04/2017 1141   CREATININE 0.86 02/09/2022 1231   CREATININE 1.1 04/04/2017 1141      Component Value Date/Time   CALCIUM 8.8 (L) 02/09/2022 1231   CALCIUM 9.1 04/04/2017 1141   ALKPHOS 95 02/09/2022 1231   ALKPHOS 89 04/04/2017 1141   AST  19 02/09/2022 1231   AST 17 04/04/2017 1141   ALT 14 02/09/2022 1231   ALT 18 04/04/2017 1141   BILITOT 0.8 02/09/2022 1231   BILITOT 0.67 04/04/2017 1141       RADIOGRAPHIC STUDIES: CT Chest W Contrast  Result Date: 02/12/2022 CLINICAL DATA:  Non-small cell lung cancer staging evaluation in a 71 year old male. * Tracking Code: BO * EXAM: CT CHEST, ABDOMEN, AND PELVIS WITH CONTRAST TECHNIQUE: Multidetector CT imaging of the chest, abdomen and pelvis was performed following the standard protocol during bolus administration of intravenous  contrast. RADIATION DOSE REDUCTION: This exam was performed according to the departmental dose-optimization program which includes automated exposure control, adjustment of the mA and/or kV according to patient size and/or use of iterative reconstruction technique. CONTRAST:  161m OMNIPAQUE IOHEXOL 300 MG/ML  SOLN COMPARISON:  Comparison is made with Aug 11, 2021. FINDINGS: CT CHEST FINDINGS Cardiovascular: Cardiac enlargement is moderate and similar to previous imaging. Small volume pericardial effusion is similar to previous imaging. RIGHT-sided Port-A-Cath terminating in the upper RIGHT atrium as before. Calcified aortic atherosclerosis and noncalcified plaque in the thoracic aorta similar to prior imaging. Central pulmonary vasculature is unremarkable on venous phase. Mediastinum/Nodes: Subcarinal nodal tissue (image 29/2) 16 mm as compared to 15 mm on the previous study. This was 15 mm in July of 2022. no adenopathy elsewhere in the chest or substantial change from prior imaging. Esophagus is unremarkable. Lungs/Pleura: LEFT perihilar and infrahilar scarring compatible with post treatment changes and volume loss with bronchiectasis. Bilateral basilar septal thickening is stable. There is no pleural effusion. Severe pulmonary emphysema in the RIGHT upper lobe similar to previous imaging. Musculoskeletal: See below for full musculoskeletal details. No chest wall  masses. CT ABDOMEN PELVIS FINDINGS Hepatobiliary: No focal, suspicious hepatic lesion. No pericholecystic stranding. No biliary duct dilation. Portal vein is patent. Cholelithiasis. Pancreas: Moderate to marked atrophy of the pancreas without ductal dilation, inflammation or visible lesion. Spleen: Normal. Adrenals/Urinary Tract: Adrenal glands are normal. Symmetric renal enhancement without signs of hydronephrosis, perinephric or perivesical stranding. No suspicious renal lesion with stable benign cysts of the LEFT kidney for which no additional dedicated follow-up imaging is recommended. Stomach/Bowel: No acute gastrointestinal findings. Sigmoid diverticulosis with mild diverticular changes. Normal appendix. Vascular/Lymphatic: Calcified atheromatous plaque of the abdominal aorta without aneurysmal dilation. Smooth contour of the IVC. There is no gastrohepatic or hepatoduodenal ligament lymphadenopathy. No retroperitoneal or mesenteric lymphadenopathy. No pelvic sidewall lymphadenopathy. Reproductive: Post TURP suggested in the prostate it would mild to moderate prostatomegaly. Otherwise unremarkable by CT. Other: Small fat containing umbilical hernia and bilateral fat containing inguinal hernias also small. Musculoskeletal: No acute bone finding. No destructive bone process. Spinal degenerative changes. ORIF of the LEFT hip, incompletely imaged. IMPRESSION: 1. Post treatment changes in the LEFT chest without new or progressive findings. 2. Subcarinal nodal tissue, mildly enlarged but within 1 mm of previous size dating back to imaging acquired in 2022, suggest attention on follow-up. 3. Incidental findings including atherosclerosis and pulmonary emphysema as outlined above. Aortic Atherosclerosis (ICD10-I70.0) and Emphysema (ICD10-J43.9). Electronically Signed   By: GZetta BillsM.D.   On: 02/12/2022 15:03   CT Abdomen Pelvis W Contrast  Result Date: 02/12/2022 CLINICAL DATA:  Non-small cell lung cancer  staging evaluation in a 71year old male. * Tracking Code: BO * EXAM: CT CHEST, ABDOMEN, AND PELVIS WITH CONTRAST TECHNIQUE: Multidetector CT imaging of the chest, abdomen and pelvis was performed following the standard protocol during bolus administration of intravenous contrast. RADIATION DOSE REDUCTION: This exam was performed according to the departmental dose-optimization program which includes automated exposure control, adjustment of the mA and/or kV according to patient size and/or use of iterative reconstruction technique. CONTRAST:  1044mOMNIPAQUE IOHEXOL 300 MG/ML  SOLN COMPARISON:  Comparison is made with Aug 11, 2021. FINDINGS: CT CHEST FINDINGS Cardiovascular: Cardiac enlargement is moderate and similar to previous imaging. Small volume pericardial effusion is similar to previous imaging. RIGHT-sided Port-A-Cath terminating in the upper RIGHT atrium as before. Calcified aortic atherosclerosis and noncalcified plaque in the thoracic aorta similar to prior imaging.  Central pulmonary vasculature is unremarkable on venous phase. Mediastinum/Nodes: Subcarinal nodal tissue (image 29/2) 16 mm as compared to 15 mm on the previous study. This was 15 mm in July of 2022. no adenopathy elsewhere in the chest or substantial change from prior imaging. Esophagus is unremarkable. Lungs/Pleura: LEFT perihilar and infrahilar scarring compatible with post treatment changes and volume loss with bronchiectasis. Bilateral basilar septal thickening is stable. There is no pleural effusion. Severe pulmonary emphysema in the RIGHT upper lobe similar to previous imaging. Musculoskeletal: See below for full musculoskeletal details. No chest wall masses. CT ABDOMEN PELVIS FINDINGS Hepatobiliary: No focal, suspicious hepatic lesion. No pericholecystic stranding. No biliary duct dilation. Portal vein is patent. Cholelithiasis. Pancreas: Moderate to marked atrophy of the pancreas without ductal dilation, inflammation or visible  lesion. Spleen: Normal. Adrenals/Urinary Tract: Adrenal glands are normal. Symmetric renal enhancement without signs of hydronephrosis, perinephric or perivesical stranding. No suspicious renal lesion with stable benign cysts of the LEFT kidney for which no additional dedicated follow-up imaging is recommended. Stomach/Bowel: No acute gastrointestinal findings. Sigmoid diverticulosis with mild diverticular changes. Normal appendix. Vascular/Lymphatic: Calcified atheromatous plaque of the abdominal aorta without aneurysmal dilation. Smooth contour of the IVC. There is no gastrohepatic or hepatoduodenal ligament lymphadenopathy. No retroperitoneal or mesenteric lymphadenopathy. No pelvic sidewall lymphadenopathy. Reproductive: Post TURP suggested in the prostate it would mild to moderate prostatomegaly. Otherwise unremarkable by CT. Other: Small fat containing umbilical hernia and bilateral fat containing inguinal hernias also small. Musculoskeletal: No acute bone finding. No destructive bone process. Spinal degenerative changes. ORIF of the LEFT hip, incompletely imaged. IMPRESSION: 1. Post treatment changes in the LEFT chest without new or progressive findings. 2. Subcarinal nodal tissue, mildly enlarged but within 1 mm of previous size dating back to imaging acquired in 2022, suggest attention on follow-up. 3. Incidental findings including atherosclerosis and pulmonary emphysema as outlined above. Aortic Atherosclerosis (ICD10-I70.0) and Emphysema (ICD10-J43.9). Electronically Signed   By: Zetta Bills M.D.   On: 02/12/2022 15:03    ASSESSMENT AND PLAN:  This is a 71 years old white male with metastatic non-small cell lung cancer, adenocarcinoma with no actionable mutations and PDL 1 expression of 5% that was initially diagnosed as stage IIIa non-small cell lung cancer, adenocarcinoma status post left lower lobectomy with lymph node dissection followed by a course of concurrent chemoradiation completed in  January 2016.  The patient had evidence for disease metastasis in October 2018 with metastatic disease to the left femur as well as left supraclavicular and right paratracheal lymph nodes. The patient is currently on systemic chemotherapy initially was with carboplatin, Alimta and Keytruda.  Carboplatin was discontinued secondary to hypersensitivity reaction starting from cycle #2.   He was also treated with 3 cycles of maintenance Alimta and Ketruda (pembrolizumab) but Alimta was discontinued secondary to intolerance. He is currently undergoing treatment with maintenance Keytruda as a single agent status post 49 cycles.  The patient has been tolerating his treatment fairly well with no concerning complaints but the patient continues to have a lot of issues with his arthritis especially in the knees and ankles.  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 His last scan showed no concerning findings for disease progression but there was new 8 mm nodule in the left lung that need close monitoring on the upcoming imaging studies.  The patient has been on observation since January 2022. The patient is doing fine today with no concerning complaints except for the baseline shortness of breath. He  had repeat CT scan of the chest, abdomen and pelvis performed recently.  I personally and independently reviewed the scan and discussed the result with the patient today. His scan showed no concerning findings for disease progression except for very slight increase in subcarinal lymph node by 1 mm. I recommended for the patient to continue on observation with repeat CT scan of the chest, abdomen and pelvis in 6 months. For the COPD, he will continue his current management by Dr. Lamonte Sakai. The patient was advised to call immediately if he has any other concerning symptoms in the interval. I discussed the assessment and treatment plan with the patient. The patient was provided an opportunity to  ask questions and all were answered. The patient agreed with the plan and demonstrated an understanding of the instructions.   The patient was advised to call back or seek an in-person evaluation if the symptoms worsen or if the condition fails to improve as anticipated.  I provided 15 minutes of non face-to-face telephone visit time during this encounter, and > 50% was spent counseling as documented under my assessment & plan.  Eilleen Kempf, MD 02/19/2022 9:06 AM  Disclaimer: This note was dictated with voice recognition software. Similar sounding words can inadvertently be transcribed and may not be corrected upon review.

## 2022-02-23 ENCOUNTER — Telehealth: Payer: Self-pay | Admitting: Medical Oncology

## 2022-02-23 NOTE — Telephone Encounter (Signed)
CT oral prep- Pt was told it is up to the ordering physician

## 2022-03-05 ENCOUNTER — Ambulatory Visit: Payer: Medicare HMO | Admitting: Internal Medicine

## 2022-03-13 ENCOUNTER — Encounter (HOSPITAL_BASED_OUTPATIENT_CLINIC_OR_DEPARTMENT_OTHER): Payer: Self-pay

## 2022-03-15 ENCOUNTER — Encounter: Payer: Self-pay | Admitting: *Deleted

## 2022-03-23 ENCOUNTER — Ambulatory Visit (INDEPENDENT_AMBULATORY_CARE_PROVIDER_SITE_OTHER): Payer: Medicare HMO | Admitting: Emergency Medicine

## 2022-03-23 ENCOUNTER — Encounter: Payer: Self-pay | Admitting: Emergency Medicine

## 2022-03-23 ENCOUNTER — Ambulatory Visit: Payer: Medicare HMO | Admitting: Emergency Medicine

## 2022-03-23 VITALS — BP 128/74 | HR 78 | Temp 97.9°F | Ht 72.0 in | Wt 237.4 lb

## 2022-03-23 DIAGNOSIS — J441 Chronic obstructive pulmonary disease with (acute) exacerbation: Secondary | ICD-10-CM | POA: Diagnosis not present

## 2022-03-23 DIAGNOSIS — J432 Centrilobular emphysema: Secondary | ICD-10-CM

## 2022-03-23 DIAGNOSIS — C3492 Malignant neoplasm of unspecified part of left bronchus or lung: Secondary | ICD-10-CM | POA: Diagnosis not present

## 2022-03-23 DIAGNOSIS — R0602 Shortness of breath: Secondary | ICD-10-CM | POA: Diagnosis not present

## 2022-03-23 LAB — PULMONARY FUNCTION TEST
DL/VA % pred: 56 %
DL/VA: 2.26 ml/min/mmHg/L
DLCO cor % pred: 42 %
DLCO cor: 11.42 ml/min/mmHg
DLCO unc % pred: 42 %
DLCO unc: 11.42 ml/min/mmHg
FEF 25-75 Post: 1.09 L/sec
FEF 25-75 Pre: 1.75 L/sec
FEF2575-%Change-Post: -37 %
FEF2575-%Pred-Post: 43 %
FEF2575-%Pred-Pre: 69 %
FEV1-%Change-Post: -8 %
FEV1-%Pred-Post: 71 %
FEV1-%Pred-Pre: 78 %
FEV1-Post: 2.44 L
FEV1-Pre: 2.67 L
FEV1FVC-%Change-Post: -2 %
FEV1FVC-%Pred-Pre: 99 %
FEV6-%Change-Post: -7 %
FEV6-%Pred-Post: 77 %
FEV6-%Pred-Pre: 83 %
FEV6-Post: 3.42 L
FEV6-Pre: 3.69 L
FEV6FVC-%Change-Post: -1 %
FEV6FVC-%Pred-Post: 104 %
FEV6FVC-%Pred-Pre: 106 %
FVC-%Change-Post: -6 %
FVC-%Pred-Post: 74 %
FVC-%Pred-Pre: 79 %
FVC-Post: 3.47 L
FVC-Pre: 3.69 L
Post FEV1/FVC ratio: 71 %
Post FEV6/FVC ratio: 99 %
Pre FEV1/FVC ratio: 72 %
Pre FEV6/FVC Ratio: 100 %
RV % pred: 73 %
RV: 1.92 L
TLC % pred: 73 %
TLC: 5.48 L

## 2022-03-23 MED ORDER — STIOLTO RESPIMAT 2.5-2.5 MCG/ACT IN AERS: 2.0000 | INHALATION_SPRAY | Freq: Every day | RESPIRATORY_TRACT | 2 refills | Status: DC

## 2022-03-23 NOTE — Patient Instructions (Addendum)
We will perform walking oximetry on room air today to see if you qualify for oxygen We will try starting Stiolto 2 puffs once daily.  We will fill out financial assistance paperwork for this Keep albuterol available to use 2 puffs to be needed for shortness of breath, chest tightness, wheezing. Continue to follow with Dr. Julien Nordmann and get your CT scan of the chest as planned Follow Dr. Lamonte Sakai in 3 months to assess your breathing on the new medication.  Call sooner if you have any problems.

## 2022-03-23 NOTE — Progress Notes (Signed)
Subjective:    Patient ID: Victor Huber, male    DOB: [birth year], 71 y.o.   MRN: [id number]  HPI  ROV 05/18/20 --follow-up visit 71 year old man with COPD and stage IV adenocarcinoma of the lung for which she underwent left lower lobe wedge resection, XRT/chemo, Keytruda.  He stopped the [facility] about 9 weeks ago as it was concerned that it may be contributing to pancreatitis and arthralgias.  His most recent CT chest was 04/12/2020.  His left lower lung was unchanged.  There was a new 8 mm more inferior medial left lower lobe pulmonary nodule of unknown significance.  No other evidence of distant disease was seen.  He developed sore throat and cough about 3 weeks ago, minimal nasal drainage. Some hoarseness. COVID test was negative 1/28.  Has used some decongestant, cough drops. He was having swallowing difficulty, is being seen by Dr Rush Landmark and is having a repeat EGD this month. He is on nexium and carafate. He is using albuterol daily. Does sometimes help his cough, also facilitates clearance of some mucous from his chest. He coughs at night even in his sleep.   ROV 03/23/22 --Victor Huber is 6 and follows up today for COPD.  He has a history of stage IV adenocarcinoma of the lung, was treated with left lower lobe wedge resection and then chemotherapy.  He was on maintenance Keytruda.  Was stopped due to concern that he may have had immunotherapy related pancreatitis in the past.  His most recent CT chest showed a very slight increase in the size of the subcarinal lymph node (only 1 mm) done 02/09/2022.  He has been experiencing progressive dyspnea with exertion.  Was seen in our office 01/04/2022.  Has been tried on diuretics for possible cardiac contribution.  Notes that he does have a sedentary lifestyle.  PFTs were ordered and he was started empirically on Spiriva  Today reports that he continues to have exertional SOB, did believe that he benefited some from the [place]. He has some occasional  dry cough, some swallowing difficulty following his XRT.   Pulmonary function testing performed today and reviewed by me show mixed obstruction and restriction on spirometry with a moderate decrease in FEV1 at 2.67 L (78% predicted).  There is no bronchodilator response.  The lung volumes confirm restriction.  He has decreased diffusion capacity that does not correct when adjusted for his alveolar volume.   Review of Systems  Constitutional:  Negative for fever and unexpected weight change.  HENT:  Negative for congestion, dental problem, ear pain, nosebleeds, postnasal drip, rhinorrhea, sinus pressure, sneezing, sore throat and trouble swallowing.   Eyes:  Negative for redness and itching.  Respiratory:  Positive for cough and shortness of breath. Negative for chest tightness and wheezing.   Cardiovascular:  Positive for palpitations. Negative for leg swelling.  Gastrointestinal:  Negative for nausea and vomiting.  Genitourinary:  Negative for dysuria.  Musculoskeletal:  Negative for joint swelling.  Skin:  Positive for rash.  Neurological:  Negative for headaches.  Hematological:  Does not bruise/bleed easily.  Psychiatric/Behavioral:  Negative for dysphoric mood. The patient is not nervous/anxious.     Past Medical History:  Diagnosis Date   Adenocarcinoma of left lung, stage 3 ([place]) 08/15/2016   Allergy    Anxiety    Arthritis    Atrial fibrillation ([place]) 08/15/2016   Atrial fibrillation ([place])    Cancer, metastatic to bone [facility])    lung   Cataract    Waiting  to schedule bilateral cataract surgery   CHF (congestive heart failure) ([place]) 01/04/2022   COPD (chronic obstructive pulmonary disease) ([place]) 08/15/2016   Depression    Dysphagia    Dysrhythmia    a-fib   GERD (gastroesophageal reflux disease)    Heart murmur    atenlol for A Fib/Eliquis   History of chemotherapy    History of radiation therapy    Hyperlipidemia    Hypertension    Hypothyroid 08/15/2016    Longstanding persistent atrial fibrillation ([place]) 08/29/2016   Pathologic fracture    left femur   Pneumonitis    S/P TURP 08/15/2016   Wears glasses    Wears hearing aid in both ears      Family History  Problem Relation Age of Onset   Breast cancer Mother    Breast cancer Maternal Aunt    Breast cancer Maternal Aunt    Lung disease Neg Hx    Colon cancer Neg Hx    Stomach cancer Neg Hx    Pancreatic cancer Neg Hx    Rectal cancer Neg Hx    Colon polyps Neg Hx    Esophageal cancer Neg Hx      Social History   Socioeconomic History   Marital status: Married    Spouse name: Not on file   Number of children: Not on file   Years of education: Not on file   Highest education level: Not on file  Occupational History    Comment: retired   Tobacco Use   Smoking status: Former    Packs/day: 1.00    Years: 50.00    Total pack years: 50.00    Types: Cigarettes    Quit date: 08/15/2012    Years since quitting: 9.6   Smokeless tobacco: Never  Vaping Use   Vaping Use: Never used  Substance and Sexual Activity   Alcohol use: No   Drug use: No   Sexual activity: Not on file  Other Topics Concern   Not on file  Social History Narrative   [facility] (09/26/16):   Originally from [place]. Moved to [facility] February 2018. Always lived in [place]. Moved to be closer to children & grandchildren. No international travel. 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 No mold exposure. No bird exposure or hot tub exposure. Enjoys reading. Previously enjoyed wood working with domestic woods.    Social Determinants of Health   Financial Resource Strain: Low Risk  (01/19/2022)   Overall Financial Resource Strain (CARDIA)    Difficulty of Paying Living Expenses: Not hard at all  Food Insecurity: No Food Insecurity (01/19/2022)   Hunger Vital Sign    Worried About Running Out of Food in the Last Year: Never true    Ran  Out of Food in the Last Year: Never true  Transportation Needs: No Transportation Needs (01/19/2022)   PRAPARE - Hydrologist (Medical): No    Lack of Transportation (Non-Medical): No  Physical Activity: Inactive (01/19/2022)   Exercise Vital Sign    Days of Exercise per Week: 0 days    Minutes of Exercise per Session: 0 min  Stress: No Stress Concern Present (01/19/2022)   [place]    Feeling of Stress : Not at all  Social Connections: Socially Isolated (01/19/2022)   Social Connection and Isolation Panel [NHANES]    Frequency of Communication with Friends  and Family: Once a week    Frequency of Social Gatherings with Friends and Family: Once a week    Attends Religious Services: Never    Marine scientist or Organizations: No    Attends Archivist Meetings: Never    Marital Status: Married  Human resources officer Violence: Not At Risk (01/19/2022)   Humiliation, Afraid, Rape, and Kick questionnaire    Fear of Current or Ex-Partner: No    Emotionally Abused: No    Physically Abused: No    Sexually Abused: No  Lots of sawdust exposure.  No military  Allergies  Allergen Reactions   Carboplatin Itching, Nausea And Vomiting and Other (See Comments)    Flushing   Flecainide Hypertension    CAUSED HEART ISSUES    Debrox [Carbamide Peroxide]     Swelling in ear canal.      Outpatient Medications Prior to Visit  Medication Sig Dispense Refill   acetaminophen (TYLENOL) 500 MG tablet Take 500 mg by mouth every 8 (eight) hours as needed for moderate pain.     atorvastatin (LIPITOR) 40 MG tablet TAKE 1 TABLET EVERY DAY 90 tablet 0   bisoprolol (ZEBETA) 5 MG tablet Take 1 tablet (5 mg total) by mouth daily. 90 tablet 2   diazepam (VALIUM) 5 MG tablet Take 1 tablet (5 mg total) by mouth every 12 (twelve) hours as needed for anxiety. 30 tablet 1   DULoxetine (CYMBALTA) 60 MG capsule  TAKE 1 CAPSULE EVERY DAY 90 capsule 0   ELIQUIS 5 MG TABS tablet TAKE 1 TABLET TWICE DAILY 180 tablet 1   esomeprazole (NEXIUM) 40 MG capsule TAKE 1 CAPSULE (40 MG TOTAL) BY MOUTH 2 (TWO) TIMES DAILY BEFORE A MEAL. 180 capsule 10   furosemide (LASIX) 20 MG tablet Take 2 tablets (40 mg total) by mouth daily for 2 days, THEN 1 tablet (20 mg total) daily. 32 tablet 0   gabapentin (NEURONTIN) 300 MG capsule Take 2 capsules (600 mg total) by mouth at bedtime as needed. 180 capsule 3   ibuprofen (ADVIL,MOTRIN) 400 MG tablet Take 400 mg by mouth every 4 (four) hours as needed for moderate pain.     levothyroxine (SYNTHROID) 200 MCG tablet TAKE 1 TABLET ONE TIME DAILY BEFORE BREAKFAST 90 tablet 10   lipase/protease/amylase (CREON) 36000 UNITS CPEP capsule Take 4 capsules po during each meal. Take 1-2 capsules po during each snack.( up to 2 snacks daily). 480 capsule 3   sacubitril-valsartan (ENTRESTO) 24-26 MG Take 1 tablet by mouth 2 (two) times daily. 60 tablet 5   sucralfate (CARAFATE) 1 g tablet TAKE 1 TABLET FOUR TIMES DAILY WITH MEALS AND AT BEDTIME 360 tablet 1   tamsulosin (FLOMAX) 0.4 MG CAPS capsule TAKE 1 CAPSULE EVERY DAY 90 capsule 10   Tiotropium Bromide Monohydrate (SPIRIVA RESPIMAT) 2.5 MCG/ACT AERS Inhale 2 puffs into the lungs daily. 4 g 5   Facility-Administered Medications Prior to Visit  Medication Dose Route Frequency Provider Last Rate Last Admin   sodium chloride flush (NS) 0.9 % injection 10 mL  10 mL Intracatheter PRN Curt Bears, MD   10 mL at 10/13/20 1028         Objective:   Physical Exam  Today's Vitals   03/23/22 1426  BP: 128/74  Pulse: 78  Temp: 97.9 F (36.6 C)  TempSrc: Oral  SpO2: 95%  Weight: 237 lb 6.4 oz (107.7 kg)  Height: 6' (1.829 m)   Body mass index is 32.2 kg/m.  Gen: Pleasant, overweight man, in no distress,  normal affect  ENT: No lesions,  mouth clear,  oropharynx clear, no postnasal drip  Neck: No JVD, no stridor  Lungs: No  use of accessory muscles, good air movement, no crackles, no wheezing on a normal breath, no wheeze on a forced exp.   Cardiovascular: RRR, heart sounds normal, no murmur or gallops, no peripheral edema  Musculoskeletal: No deformities, no cyanosis or clubbing  Neuro: alert, awake, non focal  Skin: Warm, no lesions or rash     Assessment & Plan:  Chronic obstructive pulmonary disease (HCC) Mixed restriction and obstruction on pulmonary function testing with a moderate decrease in his FEV1.  He did seem to get clinical benefit from the Spiriva.  I like to keep him on scheduled BD therapy, change him to [employer].  Will have to see how well this is covered on his prescription formulary.  Suspect he might benefit from completing financial assistance paperwork we will work on this today.  Need to rule out occult hypoxemia today.  Adenocarcinoma of left lung, stage 4 (HCC) Followed by Dr. Julien Nordmann.  On observation, no longer on Keytruda due to side effects.  His next CT chest is scheduled for May    Baltazar Apo, MD, PhD 03/23/2022, 3:03 PM [facility] [phone] or if no answer [phone]

## 2022-03-23 NOTE — Assessment & Plan Note (Signed)
Followed by Dr. Julien Nordmann.  On observation, no longer on Keytruda due to side effects.  His next CT chest is scheduled for May

## 2022-03-23 NOTE — Progress Notes (Signed)
PFT done today. 

## 2022-03-23 NOTE — Addendum Note (Signed)
Addended by: Dierdre Highman on: 03/23/2022 03:38 PM   Modules accepted: Orders

## 2022-03-23 NOTE — Assessment & Plan Note (Addendum)
Mixed restriction and obstruction on pulmonary function testing with a moderate decrease in his FEV1.  He did seem to get clinical benefit from the Spiriva.  I like to keep him on scheduled BD therapy, change him to [employer].  Will have to see how well this is covered on his prescription formulary.  Suspect he might benefit from completing financial assistance paperwork we will work on this today.  Need to rule out occult hypoxemia today.

## 2022-03-28 ENCOUNTER — Encounter (HOSPITAL_BASED_OUTPATIENT_CLINIC_OR_DEPARTMENT_OTHER): Payer: Self-pay

## 2022-04-10 NOTE — Telephone Encounter (Signed)
Patient dropped of filled out patient assistance documents, will fill out provider portion and fax to [employer].

## 2022-04-17 ENCOUNTER — Encounter (HOSPITAL_BASED_OUTPATIENT_CLINIC_OR_DEPARTMENT_OTHER): Payer: Self-pay

## 2022-04-17 ENCOUNTER — Encounter: Payer: Self-pay | Admitting: Emergency Medicine

## 2022-04-17 NOTE — Telephone Encounter (Signed)
We did review his testing but we can clarify today  His pulmonary function testing showed some evidence for abnormal airflow consistent with COPD as well as some restriction on the size of the each breath he takes.  His FEV1 was moderately decreased, consistent with moderate COPD.  That is why I have him on inhaled medication.  He underwent a walking oximetry that showed that he did not desaturate on room air with exertion.  This was good news.  He does not need to use oxygen when he is exerting himself.  Regard to the cardiac medications in question, sometimes they can cause side effects of lethargy, weakness, decreased energy.  All of these can affect are functional capacity and possibly even his breathing.  He should talk to his cardiologist about the possibility that he is having side effects.

## 2022-04-18 ENCOUNTER — Ambulatory Visit (INDEPENDENT_AMBULATORY_CARE_PROVIDER_SITE_OTHER): Payer: Medicare HMO | Admitting: Family Medicine

## 2022-04-18 ENCOUNTER — Encounter: Payer: Self-pay | Admitting: Family Medicine

## 2022-04-18 VITALS — BP 113/71 | HR 87 | Temp 97.5°F | Ht 72.0 in | Wt 236.2 lb

## 2022-04-18 DIAGNOSIS — R202 Paresthesia of skin: Secondary | ICD-10-CM

## 2022-04-18 DIAGNOSIS — E032 Hypothyroidism due to medicaments and other exogenous substances: Secondary | ICD-10-CM

## 2022-04-18 DIAGNOSIS — F419 Anxiety disorder, unspecified: Secondary | ICD-10-CM

## 2022-04-18 DIAGNOSIS — F321 Major depressive disorder, single episode, moderate: Secondary | ICD-10-CM

## 2022-04-18 DIAGNOSIS — C3492 Malignant neoplasm of unspecified part of left bronchus or lung: Secondary | ICD-10-CM | POA: Diagnosis not present

## 2022-04-18 DIAGNOSIS — K861 Other chronic pancreatitis: Secondary | ICD-10-CM | POA: Diagnosis not present

## 2022-04-18 DIAGNOSIS — R739 Hyperglycemia, unspecified: Secondary | ICD-10-CM

## 2022-04-18 DIAGNOSIS — E785 Hyperlipidemia, unspecified: Secondary | ICD-10-CM | POA: Diagnosis not present

## 2022-04-18 LAB — LIPID PANEL
Cholesterol: 103 mg/dL (ref 0–200)
HDL: 36.7 mg/dL — ABNORMAL LOW (ref >39.00)
LDL Cholesterol: 33 mg/dL (ref 0–99)
NonHDL: 66.27
Total CHOL/HDL Ratio: 3
Triglycerides: 168 mg/dL — ABNORMAL HIGH (ref 0.0–149.0)
VLDL: 33.6 mg/dL (ref 0.0–40.0)

## 2022-04-18 LAB — COMPREHENSIVE METABOLIC PANEL
ALT: 17 U/L (ref 0–53)
AST: 16 U/L (ref 0–37)
Albumin: 4.3 g/dL (ref 3.5–5.2)
Alkaline Phosphatase: 93 U/L (ref 39–117)
BUN: 18 mg/dL (ref 6–23)
CO2: 28 mEq/L (ref 19–32)
Calcium: 9.2 mg/dL (ref 8.4–10.5)
Chloride: 101 mEq/L (ref 96–112)
Creatinine, Ser: 0.91 mg/dL (ref 0.40–1.50)
GFR: 83.59 mL/min (ref >60.00)
Glucose, Bld: 207 mg/dL — ABNORMAL HIGH (ref 70–99)
Potassium: 4.5 mEq/L (ref 3.5–5.1)
Sodium: 136 mEq/L (ref 135–145)
Total Bilirubin: 1 mg/dL (ref 0.2–1.2)
Total Protein: 6.6 g/dL (ref 6.0–8.3)

## 2022-04-18 LAB — VITAMIN B12: Vitamin B-12: 287 pg/mL (ref 211–911)

## 2022-04-18 LAB — LIPASE: Lipase: 8 U/L — ABNORMAL LOW (ref 11.0–59.0)

## 2022-04-18 LAB — CBC
HCT: 44.6 % (ref 39.0–52.0)
Hemoglobin: 15.8 g/dL (ref 13.0–17.0)
MCHC: 35.3 g/dL (ref 30.0–36.0)
MCV: 95.5 fl (ref 78.0–100.0)
Platelets: 173 10*3/uL (ref 150.0–400.0)
RBC: 4.67 Mil/uL (ref 4.22–5.81)
RDW: 13.2 % (ref 11.5–15.5)
WBC: 9.7 10*3/uL (ref 4.0–10.5)

## 2022-04-18 LAB — AMYLASE: Amylase: 23 U/L — ABNORMAL LOW (ref 27–131)

## 2022-04-18 LAB — TSH: TSH: 0.25 u[IU]/mL — ABNORMAL LOW (ref 0.35–5.50)

## 2022-04-18 LAB — HEMOGLOBIN A1C: Hgb A1c MFr Bld: 9 % — ABNORMAL HIGH (ref 4.6–6.5)

## 2022-04-18 MED ORDER — DIAZEPAM 5 MG PO TABS: 5.0000 mg | ORAL_TABLET | Freq: Two times a day (BID) | ORAL | 1 refills | Status: DC | PRN

## 2022-04-18 NOTE — Assessment & Plan Note (Signed)
Check lipids today 

## 2022-04-18 NOTE — Telephone Encounter (Signed)
Per recent note by Dr. Lamonte Sakai of pulmonology regarding recent PFT "His pulmonary function testing showed some evidence for abnormal airflow consistent with COPD as well as some restriction on the size of the each breath he takes.  His FEV1 was moderately decreased, consistent with moderate COPD" he has been started on inhalers for this. His walking oximetry showed he did not need oxygen. As Victor Huber states in his message, dyspnea may be related more to lung function.  Bisoprolol is a cardioselective beta blocker which means it should not negatively affect his breathing. His heart rate and blood pressure have been well controlled in recent clinic visits. Would hesitate to switch Bisoprolol. If he can check BP and heart rate at home for a week and provide [place] a log that would be helpful. Dr. Oval Linsey stopped Atenolol back in August as it was not controlling his heart rate well.   Okay to remain off Entresto if he prefers.   If he is noting increased weight or swelling suggestive of volume overload we could double his Lasix for 3 days to see if it improves symptoms. He is presently taking 20mg  daily.   [name], NP

## 2022-04-18 NOTE — Assessment & Plan Note (Signed)
Check TSH.  He is on Synthroid 200 mcg daily.

## 2022-04-18 NOTE — Assessment & Plan Note (Signed)
Overall feels like symptoms are able.  No reported SI or HI.  We did adjust medications last time he was here and he has been doing well on Cymbalta 60 mg daily.

## 2022-04-18 NOTE — Assessment & Plan Note (Signed)
Symptoms are overall stable.  Doing well on Cymbalta 60 mg daily.  Does use Valium very infrequently as needed.  Will refill today.  Tolerating well without any significant side effects.

## 2022-04-18 NOTE — Assessment & Plan Note (Signed)
Follows with oncology.  No longer on Keytruda due to side effects.  Recent CT scan was stable.

## 2022-04-18 NOTE — Assessment & Plan Note (Signed)
We saw him for this about 7 months ago recommended labs at that time however these were not done.  We will check labs as planned previously.  Check B12 and A1c.  It is possible that symptoms could be due to sequela of chemotherapy.  Is very likely that his A1c will be elevated however if it is labs are normal would consider referral to neurology for further testing.

## 2022-04-18 NOTE — Progress Notes (Addendum)
   Victor Huber is a 72 y.o. male who presents today for an office visit.  Assessment/Plan:  Chronic Problems Addressed Today: Paresthesia We saw him for this about 7 months ago recommended labs at that time however these were not done.  We will check labs as planned previously.  Check B12 and A1c.  It is possible that symptoms could be due to sequela of chemotherapy.  Is very likely that his A1c will be elevated however if it is labs are normal would consider referral to neurology for further testing.  Dyslipidemia Check lipids today.  Hypothyroid Check TSH.  He is on Synthroid 200 mcg daily.  Depression, major, single episode, moderate (HCC) Overall feels like symptoms are able.  No reported SI or HI.  We did adjust medications last time he was here and he has been doing well on Cymbalta 60 mg daily.  Anxiety disorder Symptoms are overall stable.  Doing well on Cymbalta 60 mg daily.  Does use Valium very infrequently as needed.  Will refill today.  Tolerating well without any significant side effects.  Chronic pancreatitis (HCC) Has been seeing GI for this.  He is currently on Creon.  Does feel like he has had a little mild flareup recently.  He requests for [place] to check pancreas enzymes today.  Will check amylase and lipase.  Adenocarcinoma of left lung, stage 4 (HCC) Follows with oncology.  No longer on Keytruda due to side effects.  Recent CT scan was stable.  Hyperglycemia Recently found on c-Met obtained by oncology.  We will be checking A1c today as above.  He does have a history of pancreatitis and may have some pancreatic endocrine dysfunction as well.  Be checking C-peptide.  Depending on results may consider referral to endocrinology.      Subjective:  HPI:  See A/P for status of chronic conditions.  Patient is here for follow-up.  Last seen here about 7 months ago.  Since his last visit he has followed with multiple specialist including cardiology, pulmonology, and  oncology.  His main concern today is numbness and tingling in his feet.  This has been going on for quite all but seems to be worsening recently.  He is worried about diabetes.  He has noticed increased thirst.  Increased urination.  He has also had some high sugar readings at his oncologist office.  He has never been diagnosed with diabetes in the past.  Does have a history of chronic pancreatitis and has follow-up with GI that this in the past.  Currently on Creon for this.  He has noticed little more shortness of breath and has been following with pulmonology and cardiology for this.         Objective:  Physical Exam: BP 113/71   Pulse 87   Temp (!) 97.5 F (36.4 C) (Temporal)   Ht 6' (1.829 m)   Wt 236 lb 3.2 oz (107.1 kg)   SpO2 100%   BMI 32.03 kg/m   Gen: No acute distress, resting comfortably CV: Irregular rate and rhythm with no murmurs appreciated Pulm: Normal work of breathing, clear to auscultation bilaterally with no crackles, wheezes, or rhonchi Neuro: Grossly normal, moves all extremities Psych: Normal affect and thought content      Victor Menard M. Jimmey Ralph, MD 04/18/2022 10:51 AM

## 2022-04-18 NOTE — Patient Instructions (Signed)
It was very nice to see you today!  I will refill your medication today.  We will check blood work today.  We may need to have you see an endocrinologist depending on the results.  Take care, Dr Jimmey Ralph  PLEASE NOTE:  If you had any lab tests, please let us know if you have not heard back within a few days. You may see your results on mychart before we have a chance to review them but we will give you a call once they are reviewed by [place].   If we ordered any referrals today, please let us know if you have not heard from their office within the next week.   If you had any urgent prescriptions sent in today, please check with the pharmacy within an hour of our visit to make sure the prescription was transmitted appropriately.   Please try these tips to maintain a healthy lifestyle:  Eat at least 3 REAL meals and 1-2 snacks per day.  Aim for no more than 5 hours between eating.  If you eat breakfast, please do so within one hour of getting up.   Each meal should contain half fruits/vegetables, one quarter protein, and one quarter carbs (no bigger than a computer mouse)  Cut down on sweet beverages. This includes juice, soda, and sweet tea.   Drink at least 1 glass of water with each meal and aim for at least 8 glasses per day  Exercise at least 150 minutes every week.

## 2022-04-18 NOTE — Assessment & Plan Note (Signed)
Has been seeing GI for this.  He is currently on Creon.  Does feel like he has had a little mild flareup recently.  He requests for [place] to check pancreas enzymes today.  Will check amylase and lipase.

## 2022-04-19 ENCOUNTER — Other Ambulatory Visit: Payer: Self-pay | Admitting: Family Medicine

## 2022-04-19 LAB — C-PEPTIDE: C-Peptide: 2.96 ng/mL (ref 0.80–3.85)

## 2022-04-19 NOTE — Telephone Encounter (Signed)
Please advise 

## 2022-04-20 NOTE — Progress Notes (Signed)
Please inform patient of the following:  His blood sugar is in the diabetic range.  This could be causing quite a bit of his symptoms.  Recommend starting metformin XR 500 mg daily.  Please send new prescription if he is agreeable.  We should see him back in 3 months to recheck his A1c.  Looks like he is getting too much thyroid.  Please have him come back in a couple weeks to recheck TSH before we make any dose adjustments.    His pancreas levels are stable.  The rest of his labs are all stable.  We can recheck everything else in a year.

## 2022-04-25 ENCOUNTER — Telehealth: Payer: Self-pay | Admitting: Family Medicine

## 2022-04-25 NOTE — Telephone Encounter (Signed)
Schedule OV lab F/u with PCP

## 2022-04-25 NOTE — Telephone Encounter (Signed)
Pt would like a call back with lab results

## 2022-05-03 ENCOUNTER — Encounter: Payer: Self-pay | Admitting: Family Medicine

## 2022-05-03 ENCOUNTER — Ambulatory Visit (INDEPENDENT_AMBULATORY_CARE_PROVIDER_SITE_OTHER): Payer: Medicare HMO | Admitting: Family Medicine

## 2022-05-03 ENCOUNTER — Telehealth: Payer: Self-pay | Admitting: Family Medicine

## 2022-05-03 VITALS — BP 121/78 | HR 97 | Temp 98.0°F | Ht 72.0 in | Wt 235.2 lb

## 2022-05-03 DIAGNOSIS — E1169 Type 2 diabetes mellitus with other specified complication: Secondary | ICD-10-CM | POA: Diagnosis not present

## 2022-05-03 DIAGNOSIS — E032 Hypothyroidism due to medicaments and other exogenous substances: Secondary | ICD-10-CM

## 2022-05-03 DIAGNOSIS — R1310 Dysphagia, unspecified: Secondary | ICD-10-CM | POA: Diagnosis not present

## 2022-05-03 DIAGNOSIS — E119 Type 2 diabetes mellitus without complications: Secondary | ICD-10-CM

## 2022-05-03 HISTORY — DX: Type 2 diabetes mellitus without complications: E11.9

## 2022-05-03 LAB — TSH: TSH: 0.47 u[IU]/mL (ref 0.35–5.50)

## 2022-05-03 MED ORDER — METFORMIN HCL ER 500 MG PO TB24: 500.0000 mg | ORAL_TABLET | Freq: Every day | ORAL | 3 refills | Status: DC

## 2022-05-03 NOTE — Assessment & Plan Note (Signed)
Has been following with GI for dysphagia.  Thought to be likely secondary to treatment for cancer.  He has underwent a few different EGDs with dilations though still has persistent issues.  Will place referral to SLP for further evaluation and management.  Does not wish to undergo any more EGDs at this point.

## 2022-05-03 NOTE — Patient Instructions (Signed)
It was very nice to see you today!  Please start the metformin.  We know if you have any side effects.  I will see you back in 3 months to recheck your A1c.  I will refer you to speech therapist to help with your swallowing.  Please let me know if you have any issues with your hernia.  We can continue to keep an eye on it for now.  We will check blood work today.  Take care, Dr Jerline Pain  PLEASE NOTE:  If you had any lab tests, please let us know if you have not heard back within a few days. You may see your results on mychart before we have a chance to review them but we will give you a call once they are reviewed by [place].   If we ordered any referrals today, please let us know if you have not heard from their office within the next week.   If you had any urgent prescriptions sent in today, please check with the pharmacy within an hour of our visit to make sure the prescription was transmitted appropriately.   Please try these tips to maintain a healthy lifestyle:  Eat at least 3 REAL meals and 1-2 snacks per day.  Aim for no more than 5 hours between eating.  If you eat breakfast, please do so within one hour of getting up.   Each meal should contain half fruits/vegetables, one quarter protein, and one quarter carbs (no bigger than a computer mouse)  Cut down on sweet beverages. This includes juice, soda, and sweet tea.   Drink at least 1 glass of water with each meal and aim for at least 8 glasses per day  Exercise at least 150 minutes every week.

## 2022-05-03 NOTE — Telephone Encounter (Signed)
Pt states the med  metFORMIN (GLUCOPHAGE-XR) 500 MG 24 hr tablet  Was sent to the wrong pharmacy. It should have been sent to  [place], [place]. Phone: [phone]  Fax: [phone]

## 2022-05-03 NOTE — Progress Notes (Signed)
   Victor Huber is a 72 y.o. male who presents today for an office visit.  Assessment/Plan:  New/Acute Problems: Umbilical Hernia No red flags.  Easily reducible on exam.  He is not interested in surgery at this point.  It is reassuring symptoms have not changed for the last several years.  Will continue with watchful waiting.  Chronic Problems Addressed Today: T2DM (type 2 diabetes mellitus) ([place]) Discussed recent labs with patient.  A1c 9.0.  C-peptide was within normal ranges.  We discussed treatment options.  He would like to start metformin.  Will start 500 mg daily.  We discussed potential side effects.  We also discussed lifestyle modifications.  He will come back in 3 months to recheck A1c.  Does have history of chronic pancreatitis which could be contributing.  May consider referral to endocrinology depending on response to metformin.  Hypothyroid On Synthroid 200 mcg daily.  Last TSH was 0.25.  Will recheck today before making any dose dressings.  Dysphagia Has been following with GI for dysphagia.  Thought to be likely secondary to treatment for cancer.  He has underwent a few different EGDs with dilations though still has persistent issues.  Will place referral to SLP for further evaluation and management.  Does not wish to undergo any more EGDs at this point.     Subjective:  HPI:  Patient here for follow up. We last saw him a couple of weeks ago he was concerned about high blood sugar.  Labs at that time showed an A1c of 9.  TSH was Eleva 0.25.  He is here today to discuss these.  He is also been having ongoing issues with umbilical hernia.  This is been present for years.  No pain.  He is able to push it back in place.  No significant changes recently.       Objective:  Physical Exam: BP 121/78   Pulse 97   Temp 98 F (36.7 C) (Temporal)   Ht 6' (1.829 m)   Wt 235 lb 3.2 oz (106.7 kg)   SpO2 94%   BMI 31.90 kg/m   Wt Readings from Last 3 Encounters:   05/03/22 235 lb 3.2 oz (106.7 kg)  04/18/22 236 lb 3.2 oz (107.1 kg)  03/23/22 237 lb 6.4 oz (107.7 kg)    Gen: No acute distress, resting comfortably CV: Regular rate and rhythm with no murmurs appreciated Pulm: Normal work of breathing, clear to auscultation bilaterally with no crackles, wheezes, or rhonchi GI: Easily reducible umbilical hernia. Neuro: Grossly normal, moves all extremities Psych: Normal affect and thought content      Jezelle Gullick M. Jerline Pain, MD 05/03/2022 2:12 PM

## 2022-05-03 NOTE — Assessment & Plan Note (Signed)
On Synthroid 200 mcg daily.  Last TSH was 0.25.  Will recheck today before making any dose dressings.

## 2022-05-03 NOTE — Assessment & Plan Note (Signed)
Discussed recent labs with patient.  A1c 9.0.  C-peptide was within normal ranges.  We discussed treatment options.  He would like to start metformin.  Will start 500 mg daily.  We discussed potential side effects.  We also discussed lifestyle modifications.  He will come back in 3 months to recheck A1c.  Does have history of chronic pancreatitis which could be contributing.  May consider referral to endocrinology depending on response to metformin.

## 2022-05-04 NOTE — Progress Notes (Signed)
Please inform patient of the following:  Thyroid level back to normal.  He can continue current dose and we can recheck in 6 to 12 months.

## 2022-05-07 ENCOUNTER — Encounter: Payer: Self-pay | Admitting: Internal Medicine

## 2022-05-07 ENCOUNTER — Other Ambulatory Visit: Payer: Self-pay | Admitting: *Deleted

## 2022-05-07 ENCOUNTER — Other Ambulatory Visit: Payer: Self-pay

## 2022-05-07 MED ORDER — METFORMIN HCL ER 500 MG PO TB24: 500.0000 mg | ORAL_TABLET | Freq: Every day | ORAL | 3 refills | Status: DC

## 2022-05-07 NOTE — Telephone Encounter (Signed)
Refill sent to [facility]

## 2022-05-10 ENCOUNTER — Encounter (HOSPITAL_COMMUNITY): Payer: Self-pay | Admitting: *Deleted

## 2022-05-15 NOTE — Telephone Encounter (Signed)
Patient has appt 05/22/22 with Dr. Oval Linsey. Concerns can be addressed at that visit.   Victor Dubonnet, NP

## 2022-05-22 ENCOUNTER — Encounter (HOSPITAL_BASED_OUTPATIENT_CLINIC_OR_DEPARTMENT_OTHER): Payer: Self-pay | Admitting: Cardiovascular Disease

## 2022-05-22 ENCOUNTER — Ambulatory Visit (HOSPITAL_BASED_OUTPATIENT_CLINIC_OR_DEPARTMENT_OTHER): Payer: Medicare HMO | Admitting: Cardiovascular Disease

## 2022-05-22 VITALS — BP 110/70 | HR 116 | Ht 72.0 in | Wt 239.6 lb

## 2022-05-22 DIAGNOSIS — Z79899 Other long term (current) drug therapy: Secondary | ICD-10-CM

## 2022-05-22 DIAGNOSIS — R0602 Shortness of breath: Secondary | ICD-10-CM

## 2022-05-22 DIAGNOSIS — Z5181 Encounter for therapeutic drug level monitoring: Secondary | ICD-10-CM | POA: Diagnosis not present

## 2022-05-22 DIAGNOSIS — I482 Chronic atrial fibrillation, unspecified: Secondary | ICD-10-CM | POA: Diagnosis not present

## 2022-05-22 DIAGNOSIS — I4819 Other persistent atrial fibrillation: Secondary | ICD-10-CM

## 2022-05-22 DIAGNOSIS — E785 Hyperlipidemia, unspecified: Secondary | ICD-10-CM | POA: Diagnosis not present

## 2022-05-22 MED ORDER — DIGOXIN 125 MCG PO TABS: 0.1250 mg | ORAL_TABLET | Freq: Every day | ORAL | 3 refills | Status: DC

## 2022-05-22 NOTE — Patient Instructions (Signed)
Medication Instructions:  START DIGOXIN 0.125 MG DAILY   *If you need a refill on your cardiac medications before your next appointment, please call your pharmacy*  Lab Work: DIGOXIN LEVEL/BMET/MAGNESIUM/BNP IN 1 WEEK [place] AM, DO NOT TAKE MORNING DOSE OF   If you have labs (blood work) drawn today and your tests are completely normal, you will receive your results only by: MyChart Message (if you have MyChart) OR A paper copy in the mail If you have any lab test that is abnormal or we need to change your treatment, we will call you to review the results.  Testing/Procedures: NONE   Follow-Up: 09/25/2022 1:30 WITH Overton Mam NP

## 2022-05-22 NOTE — Progress Notes (Signed)
Cardiology Office Note  Date:  06/29/2022   ID:  ARRINGTON FAWSON, DOB [birth year], MRN [id number]  PCP:  [name], MD  Cardiologist:   Skeet Latch, MD   No chief complaint on file.    History of Present Illness: Victor Huber is a 72 y.o. male with persistent atrial fibrillation, diabetes, COPD, hypothyroidism and metastatic non-small cell lung cancer s/p resection and on chemotherapy and prior tobacco abuse here for follow up.  He moved from [place] 05/2016 and established care with Dr. Julien Nordmann.  He was diagnosed with atrial fibrillation in 2012.  He has been chronically in atrial fibrillation lately.  In the past he was on amiodarone. However this was discontinued when he was diagnosed with lung cancer in 2016. After that he was switched to sotalol but failed subsequent DCCV.  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  Mr. Paddack reported fatigue and shortness of breath. He was switched from metoprolol to atenolol, given that he previously felt better on this medication.  He was also referred to the atrial fibrillation clinic where he discussed using dofetilide, amiodarone, and ablation. He was not felt to be a good candidate for ablation declined antiarrhythmics due to cost.  Mr. Sterle continues with [facility] therapy.  This was stopped for a while due to pancreatitis. He reported exertional dyspnea and fatigue. He had a nuclear stress test 03/2020 which revealed multiple artifacts but no ischemia. We discussed antiarrythmic options and felt dofetilide was best, but he declined due to the need for hospitalization. He was seen in Afib clinic 12/2020 after he was found to have Afib RDR after an endoscopy procedure. His atenolol had been held that morning. She recommended continuing his current therapy.   At his last appointment he reported increased shortness of breath. He was in atrial fibrillation and rates were uncontrolled. Atenolol was switched  to metoprolol. TSH was abnormal. Echo revealed LVEF 40-45% with global hypokinesis and mild LVH. RV function was mildly reduced. He saw Laurann Montana, NP 12/2021 and his dyspnea had increased. He switched to bisoprolol and diuresed with Lasix due to volume overload. He followed up 01/2022 and his weight was down 4 pounds. He continued to have exertional dyspnea. He was started on Entresto.  Today, he states he is feeling out of breath most of the time with low energy levels. He is barely able to walk half a block without needing to stop and rest. He attributes this to his heart skipping which happens frequently. Generally he has not felt differently since starting Entresto, although he believes his skipped heart beats may be more frequent with Entresto. Recently he was diagnosed with type 2 diabetes. Within the past month he has started exercising more and eating healthier, but is frustrated that he continues to gain weight. He is monitoring his carbs and sugars. More often he is eating lean proteins, fruits, and vegetables. He feels as though his abdomen is distended. He denies any chest pain, or peripheral edema. No lightheadedness, headaches, syncope, orthopnea, or PND.   Past Medical History:  Diagnosis Date   Adenocarcinoma of left lung, stage 3 ([place]) 08/15/2016   Allergy    Anxiety    Arthritis    Atrial fibrillation ([place]) 08/15/2016   Atrial fibrillation (HCC)    Cancer, metastatic to bone [facility])    lung   Cataract    Waiting to schedule bilateral cataract surgery   CHF (congestive heart failure) ([place]) 01/04/2022  COPD (chronic obstructive pulmonary disease) ([place]) 08/15/2016   Depression    Dysphagia    Dysrhythmia    a-fib   GERD (gastroesophageal reflux disease)    Heart murmur    atenlol for A Fib/Eliquis   History of chemotherapy    History of radiation therapy    Hyperlipidemia    Hypertension    Hypothyroid 08/15/2016   Longstanding persistent atrial fibrillation ([place])  08/29/2016   Pathologic fracture    left femur   Pneumonitis    S/P TURP 08/15/2016   T2DM (type 2 diabetes mellitus) ([place]) 05/03/2022   Wears glasses    Wears hearing aid in both ears     Past Surgical History:  Procedure Laterality Date   BIOPSY  12/21/2019   Procedure: BIOPSY;  Surgeon: Irving Copas., MD;  Location: [place];  Service: Gastroenterology;;   BIOPSY  05/23/2020   Procedure: BIOPSY;  Surgeon: Irving Copas., MD;  Location: [facility];  Service: Gastroenterology;;   BRONCHOSCOPY  10/2014   CARDIAC CATHETERIZATION     05/07/12   CARDIOVERSION     x2   COLONOSCOPY     several yrs   DG BIOPSY LUNG Left 10/2014   FNA - Adenocarcinoma    ESOPHAGOGASTRODUODENOSCOPY (EGD) WITH PROPOFOL N/A 12/21/2019   Procedure: ESOPHAGOGASTRODUODENOSCOPY (EGD) WITH PROPOFOL;  Surgeon: Irving Copas., MD;  Location: [place];  Service: Gastroenterology;  Laterality: N/A;   ESOPHAGOGASTRODUODENOSCOPY (EGD) WITH PROPOFOL N/A 05/23/2020   Procedure: ESOPHAGOGASTRODUODENOSCOPY (EGD) WITH PROPOFOL;  Surgeon: Rush Landmark Telford Nab., MD;  Location: [facility];  Service: Gastroenterology;  Laterality: N/A;   ESOPHAGOGASTRODUODENOSCOPY (EGD) WITH PROPOFOL N/A 06/30/2020   Procedure: ESOPHAGOGASTRODUODENOSCOPY (EGD) WITH PROPOFOL;  Surgeon: Rush Landmark Telford Nab., MD;  Location: [place];  Service: Gastroenterology;  Laterality: N/A;  ultra slim scope avail   EUS N/A 12/21/2019   Procedure: UPPER ENDOSCOPIC ULTRASOUND (EUS) RADIAL;  Surgeon: Irving Copas., MD;  Location: [place];  Service: Gastroenterology;  Laterality: N/A;   FEMUR IM NAIL Left 02/19/2017   FEMUR IM NAIL Left 02/19/2017   Procedure: INTRAMEDULLARY (IM) NAIL FEMORAL;  Surgeon: Marchia Bond, MD;  Location: [place];  Service: Orthopedics;  Laterality: Left;   IR FLUORO GUIDE PORT INSERTION RIGHT  07/03/2017   IR US GUIDE VASC ACCESS RIGHT  07/03/2017   LUNG CANCER SURGERY  Left 12/2014   [place] Resection    MULTIPLE TOOTH EXTRACTIONS     SAVORY DILATION N/A 05/23/2020   Procedure: SAVORY DILATION;  Surgeon: Irving Copas., MD;  Location: [facility];  Service: Gastroenterology;  Laterality: N/A;   SAVORY DILATION N/A 06/30/2020   Procedure: SAVORY DILATION;  Surgeon: Rush Landmark Telford Nab., MD;  Location: [place];  Service: Gastroenterology;  Laterality: N/A;   Status post TURP     TONSILLECTOMY     UPPER GASTROINTESTINAL ENDOSCOPY       Current Outpatient Medications  Medication Sig Dispense Refill   acetaminophen (TYLENOL) 500 MG tablet Take 500 mg by mouth every 8 (eight) hours as needed for moderate pain.     bisoprolol (ZEBETA) 5 MG tablet Take 1 tablet (5 mg total) by mouth daily. 90 tablet 2   diazepam (VALIUM) 5 MG tablet Take 1 tablet (5 mg total) by mouth every 12 (twelve) hours as needed for anxiety. 30 tablet 1   DULoxetine (CYMBALTA) 60 MG capsule TAKE 1 CAPSULE EVERY DAY 90 capsule 3   esomeprazole (NEXIUM) 40 MG capsule TAKE 1 CAPSULE (40 MG TOTAL) BY MOUTH 2 (  TWO) TIMES DAILY BEFORE A MEAL. 180 capsule 10   gabapentin (NEURONTIN) 300 MG capsule Take 2 capsules (600 mg total) by mouth at bedtime as needed. 180 capsule 3   levothyroxine (SYNTHROID) 200 MCG tablet TAKE 1 TABLET ONE TIME DAILY BEFORE BREAKFAST 90 tablet 10   lipase/protease/amylase (CREON) 36000 UNITS CPEP capsule Take 4 capsules po during each meal. Take 1-2 capsules po during each snack.( up to 2 snacks daily). 480 capsule 3   metFORMIN (GLUCOPHAGE-XR) 500 MG 24 hr tablet Take 1 tablet (500 mg total) by mouth daily with breakfast. 90 tablet 3   sucralfate (CARAFATE) 1 g tablet TAKE 1 TABLET FOUR TIMES DAILY WITH MEALS AND AT BEDTIME 360 tablet 1   tamsulosin (FLOMAX) 0.4 MG CAPS capsule TAKE 1 CAPSULE EVERY DAY 90 capsule 10   Tiotropium Bromide Monohydrate (SPIRIVA RESPIMAT) 2.5 MCG/ACT AERS Inhale 2 puffs into the lungs daily. 4 g 5   Tiotropium  Bromide-Olodaterol (STIOLTO RESPIMAT) 2.5-2.5 MCG/ACT AERS Inhale 2 puffs into the lungs daily. 4 g 2   apixaban (ELIQUIS) 5 MG TABS tablet TAKE 1 TABLET TWICE DAILY 180 tablet 1   atorvastatin (LIPITOR) 40 MG tablet TAKE 1 TABLET EVERY DAY 90 tablet 3   digoxin (LANOXIN) 0.125 MG tablet Take 1 tablet (0.125 mg total) by mouth daily. 90 tablet 3   No current facility-administered medications for this visit.    Allergies:   Carboplatin, Flecainide, and Debrox [carbamide peroxide]    Social History:  The patient  reports that he quit smoking about 9 years ago. His smoking use included cigarettes. He has a 50.00 pack-year smoking history. He has never used smokeless tobacco. He reports that he does not drink alcohol and does not use drugs.   Family History:  The patient's family history includes Breast cancer in his maternal aunt, maternal aunt, and mother.    ROS:   Please see the history of present illness.  (+) Shortness of breath (+) Fatigue (+) Palpitations All other systems are reviewed and negative.   PHYSICAL EXAM: VS:  BP 110/70 (BP Location: Left Arm, Patient Position: Sitting, Cuff Size: Large)   Pulse (!) 116   Ht 6' (1.829 m)   Wt 239 lb 9.6 oz (108.7 kg)   BMI 32.50 kg/m  , BMI Body mass index is 32.5 kg/m. GENERAL:  Well-appearing.  No acute distress.  HEENT: Pupils equal round and reactive, fundi not visualized, oral mucosa unremarkable NECK:  No jugular venous distention, waveform within normal limits, carotid upstroke brisk and symmetric, no bruits, no thyromegaly LUNGS:  Clear to auscultation bilaterally HEART:  Tachycardic.  Irregularly irregular.  PMI not displaced or sustained,S1 and S2 within normal limits, no S3, no S4, no clicks, no rubs, no murmurs ABD:  Flat, positive bowel sounds normal in frequency in pitch, no bruits, no rebound, no guarding, no midline pulsatile mass, no hepatomegaly, no splenomegaly EXT:  2 plus pulses throughout, no edema, no  cyanosis no clubbing SKIN:  No rashes no nodules NEURO:  Cranial nerves II through XII grossly intact, motor grossly intact throughout PSYCH:  Cognitively intact, oriented to person place and time   EKG: EKG is personally reviewed.   05/22/2022:  Atrial fibrillation. Rate 116 bpm. 11/20/21: Atrial fibrillation. Rate 115 bpm. Incomplete RBBB. 03/18/2020: Atrial fibrillation.  Rate 83 bpm.  PVCs.  Nonspecific IVCD.  Nonspecific T wave abnormalities. 03/19/19: Atrial fibrillation.  Rate 104 bpm.  Incomplete RBBB.   01/02/18: Atrial fibrillation.  Rate 95 bpm. 08/29/16:  atrial fibrillation. Rate 92 bpm. PVCs. Right axis deviation. Incomplete RBBB. Nonspecific ST/T changes.    Recent Labs: 04/18/2022: ALT 17; Hemoglobin 15.8; Platelets 173.0 05/03/2022: TSH 0.47 05/31/2022: BNP 106.3; BUN 12; Creatinine, Ser 0.92; Magnesium 1.8; Potassium 4.7; Sodium 140    Lipid Panel    Component Value Date/Time   CHOL 103 04/18/2022 1046   CHOL 115 12/19/2016 0933   TRIG 168.0 (H) 04/18/2022 1046   HDL 36.70 (L) 04/18/2022 1046   HDL 33 (L) 12/19/2016 0933   CHOLHDL 3 04/18/2022 1046   VLDL 33.6 04/18/2022 1046   LDLCALC 33 04/18/2022 1046   LDLCALC 34 12/19/2016 0933   LDLDIRECT 55.0 03/06/2019 0956      Wt Readings from Last 3 Encounters:  05/22/22 239 lb 9.6 oz (108.7 kg)  05/03/22 235 lb 3.2 oz (106.7 kg)  04/18/22 236 lb 3.2 oz (107.1 kg)      ASSESSMENT AND PLAN:  Chronic atrial fibrillation [facility]) Mr. Sollie remains in atrial fibrillation.  Rates are uncontrolled.  He has been in atrial fibrillation for years.  I suspect that is current dyspnea is more due to the tachycardia than the atrial fibrillation.  He does not have much BP room for additional rate control.  We will add digoxin 0.125mg  daily.  Check a trough digoxin level, BMP , magnesium, and BNP in one week.  Continue bisoprolol and Eliquis.  His underlying metastatic lung cancer is certainly contributing to his exertional dyspnea  as well.  No plan to pursue a rhythm control strategy.  Hyperlipidemia Continue atorvastatin.  Encouraged him to keep working on diet and exercise.    Disposition: FU with Molleigh Huot C. Oval Linsey, MD, [facility] in 4 months.   Medication Adjustments/Labs and Tests Ordered: Current medicines are reviewed at length with the patient today.  Concerns regarding medicines are outlined above.   Orders Placed This Encounter  Procedures   Basic metabolic panel   Magnesium   B Nat Peptide   Digoxin level   EKG 12-Lead   Meds ordered this encounter  Medications   DISCONTD: digoxin (LANOXIN) 0.125 MG tablet    Sig: Take 1 tablet (0.125 mg total) by mouth daily.    Dispense:  90 tablet    Refill:  3   Patient Instructions  Medication Instructions:  START DIGOXIN 0.125 MG DAILY   *If you need a refill on your cardiac medications before your next appointment, please call your pharmacy*  Lab Work: DIGOXIN LEVEL/BMET/MAGNESIUM/BNP IN 1 WEEK [place] AM, DO NOT TAKE MORNING DOSE OF   If you have labs (blood work) drawn today and your tests are completely normal, you will receive your results only by: [place] (if you have MyChart) OR A paper copy in the mail If you have any lab test that is abnormal or we need to change your treatment, we will call you to review the results.  Testing/Procedures: NONE   Follow-Up: 09/25/2022 1:30 WITH CAITLIN W NP   Time spent: 40 minutes-Greater than 50% of this time was spent in counseling, explanation of diagnosis, planning of further management, and coordination of care.   I,Mathew Stumpf,acting as a Education administrator for Skeet Latch, MD.,have documented all relevant documentation on the behalf of Skeet Latch, MD,as directed by  Skeet Latch, MD while in the presence of Skeet Latch, MD.  I, Watkins Oval Linsey, MD have reviewed all documentation for this visit.  The documentation of the exam, diagnosis, procedures, and orders on  06/29/2022 are all  accurate and complete.  Signed, Zoelle Markus C. Oval Linsey, MD, [facility]  06/29/2022 12:52 PM    [facility]

## 2022-05-30 ENCOUNTER — Telehealth (HOSPITAL_BASED_OUTPATIENT_CLINIC_OR_DEPARTMENT_OTHER): Payer: Self-pay | Admitting: *Deleted

## 2022-05-30 NOTE — Telephone Encounter (Signed)
Medication send into pt [place]

## 2022-05-31 DIAGNOSIS — I4819 Other persistent atrial fibrillation: Secondary | ICD-10-CM | POA: Diagnosis not present

## 2022-05-31 DIAGNOSIS — R0602 Shortness of breath: Secondary | ICD-10-CM | POA: Diagnosis not present

## 2022-05-31 DIAGNOSIS — Z5181 Encounter for therapeutic drug level monitoring: Secondary | ICD-10-CM | POA: Diagnosis not present

## 2022-06-01 ENCOUNTER — Telehealth (HOSPITAL_BASED_OUTPATIENT_CLINIC_OR_DEPARTMENT_OTHER): Payer: Self-pay

## 2022-06-01 LAB — BASIC METABOLIC PANEL
BUN/Creatinine Ratio: 13 (ref 10–24)
BUN: 12 mg/dL (ref 8–27)
CO2: 23 mmol/L (ref 20–29)
Calcium: 9.4 mg/dL (ref 8.6–10.2)
Chloride: 100 mmol/L (ref 96–106)
Creatinine, Ser: 0.92 mg/dL (ref 0.76–1.27)
Glucose: 252 mg/dL — ABNORMAL HIGH (ref 70–99)
Potassium: 4.7 mmol/L (ref 3.5–5.2)
Sodium: 140 mmol/L (ref 134–144)
eGFR: 88 mL/min/{1.73_m2} (ref >59)

## 2022-06-01 LAB — MAGNESIUM: Magnesium: 1.8 mg/dL (ref 1.6–2.3)

## 2022-06-01 LAB — DIGOXIN LEVEL: Digoxin, Serum: 0.4 ng/mL — ABNORMAL LOW (ref 0.5–0.9)

## 2022-06-01 LAB — BRAIN NATRIURETIC PEPTIDE: BNP: 106.3 pg/mL — ABNORMAL HIGH (ref 0.0–100.0)

## 2022-06-01 MED ORDER — DIGOXIN 125 MCG PO TABS: 0.1250 mg | ORAL_TABLET | Freq: Every day | ORAL | 3 refills | Status: DC

## 2022-06-01 NOTE — Telephone Encounter (Signed)
Received fax from [facility] requesting refills for Digoxin. Rx request sent to pharmacy.

## 2022-06-11 ENCOUNTER — Other Ambulatory Visit (HOSPITAL_COMMUNITY): Payer: Self-pay | Admitting: Cardiovascular Disease

## 2022-06-11 ENCOUNTER — Other Ambulatory Visit: Payer: Self-pay | Admitting: Family Medicine

## 2022-06-11 DIAGNOSIS — E785 Hyperlipidemia, unspecified: Secondary | ICD-10-CM

## 2022-06-11 NOTE — Telephone Encounter (Signed)
Please review for refill. Thank you! 

## 2022-06-11 NOTE — Telephone Encounter (Signed)
Prescription refill request for Eliquis received.  Indication: afib  Last office visit: Oval Linsey, 05/22/2022 Scr: 0.92, 05/31/2022 Age: 72 yo  Weight: 108.7 kg   Refill sent.

## 2022-06-15 ENCOUNTER — Encounter (HOSPITAL_BASED_OUTPATIENT_CLINIC_OR_DEPARTMENT_OTHER): Payer: Self-pay | Admitting: *Deleted

## 2022-06-29 ENCOUNTER — Encounter (HOSPITAL_BASED_OUTPATIENT_CLINIC_OR_DEPARTMENT_OTHER): Payer: Self-pay | Admitting: Cardiovascular Disease

## 2022-06-29 ENCOUNTER — Inpatient Hospital Stay: Payer: Medicare HMO | Attending: Emergency Medicine | Admitting: Licensed Clinical Social Worker

## 2022-06-29 DIAGNOSIS — C3490 Malignant neoplasm of unspecified part of unspecified bronchus or lung: Secondary | ICD-10-CM

## 2022-06-29 NOTE — Progress Notes (Signed)
[place] CSW Progress Note  Holiday representative  spoke w/ pt's wife to provide w/ a list of counselors who may be suitable to provide couples counseling.  Pt's wife informed all counselors can be found on the [employer] website to check biographies.  CSW reiterated availability for short term counseling at the cancer center if it would be beneficial.  CSW to remain available as appropriate to provide support throughout duration of treatment.        Henriette Combs, LCSW

## 2022-06-29 NOTE — Assessment & Plan Note (Signed)
Continue atorvastatin.  Encouraged him to keep working on diet and exercise.

## 2022-06-29 NOTE — Assessment & Plan Note (Addendum)
[name] remains in atrial fibrillation.  Rates are uncontrolled.  He has been in atrial fibrillation for years.  I suspect that is current dyspnea is more due to the tachycardia than the atrial fibrillation.  He does not have much BP room for additional rate control.  We will add digoxin 0.125mg  daily.  Check a trough digoxin level, BMP , magnesium, and BNP in one week.  Continue bisoprolol and Eliquis.  His underlying metastatic lung cancer is certainly contributing to his exertional dyspnea as well.  No plan to pursue a rhythm control strategy.

## 2022-07-02 ENCOUNTER — Ambulatory Visit: Payer: Medicare HMO | Attending: Family Medicine

## 2022-07-02 DIAGNOSIS — R490 Dysphonia: Secondary | ICD-10-CM | POA: Diagnosis not present

## 2022-07-02 DIAGNOSIS — R1313 Dysphagia, pharyngeal phase: Secondary | ICD-10-CM | POA: Insufficient documentation

## 2022-07-02 DIAGNOSIS — R1310 Dysphagia, unspecified: Secondary | ICD-10-CM | POA: Diagnosis not present

## 2022-07-02 NOTE — Patient Instructions (Signed)
  I am recommending a swallow test for you called a modified barium swallow where they will look at food and liquid as it moves down your throat - and give you some tips for having food move easier. They may recommend swallow therapy.  Do a hard swallow - 15-20 times twice a day   WHEN YOU EAT: Turn your head to the right Take smaller bites Take a few sips before your meal Keep a simple food diary :  what you ate, how easy to pass through (1-10), and did you cough y/n - bring this to swallow test    What to Do Instead of Clearing Your Throat Use these strategies instead of clearing your throat: 1. Swallow your saliva. Swallow as hard as you can.  2. Take a drink of lukewarm water. 3. Suck on ice chips. 4. Use a silent cough. Whisper the word "huh" from your belly without making a sound and then swallow. This is like a cough but without using your voice. 5. Hum on an "M" and then swallow. 6. Use a light, gentle cough (like tapping your vocal folds together) and then swallow. 7. Silently count to 10 and then swallow

## 2022-07-02 NOTE — Therapy (Signed)
OUTPATIENT SPEECH LANGUAGE PATHOLOGY SWALLOW EVALUATION   Patient Name: Victor Huber MRN: [id number] DOB:Sep 24, 1950, 72 y.o., male Today's Date: 07/02/2022  PCP: Dimas Chyle MD REFERRING PROVIDER: same as PCP  END OF SESSION:  End of Session - 07/02/22 1559     Visit Number 1    Number of Visits 17    Date for SLP Re-Evaluation 09/10/22    SLP Start Time 53    SLP Stop Time  62    SLP Time Calculation (min) 43 min    Activity Tolerance Patient tolerated treatment well             Past Medical History:  Diagnosis Date   Adenocarcinoma of left lung, stage 3 ([place]) 08/15/2016   Allergy    Anxiety    Arthritis    Atrial fibrillation ([place]) 08/15/2016   Atrial fibrillation ([place])    Cancer, metastatic to bone ([place])    lung   Cataract    Waiting to schedule bilateral cataract surgery   CHF (congestive heart failure) ([place]) 01/04/2022   COPD (chronic obstructive pulmonary disease) ([place]) 08/15/2016   Depression    Dysphagia    Dysrhythmia    a-fib   GERD (gastroesophageal reflux disease)    Heart murmur    atenlol for A Fib/Eliquis   History of chemotherapy    History of radiation therapy    Hyperlipidemia    Hypertension    Hypothyroid 08/15/2016   Longstanding persistent atrial fibrillation ([place]) 08/29/2016   Pathologic fracture    left femur   Pneumonitis    S/P TURP 08/15/2016   T2DM (type 2 diabetes mellitus) ([place]) 05/03/2022   Wears glasses    Wears hearing aid in both ears    Past Surgical History:  Procedure Laterality Date   BIOPSY  12/21/2019   Procedure: BIOPSY;  Surgeon: Irving Copas., MD;  Location: [facility];  Service: Gastroenterology;;   BIOPSY  05/23/2020   Procedure: BIOPSY;  Surgeon: Irving Copas., MD;  Location: [facility];  Service: Gastroenterology;;   BRONCHOSCOPY  10/2014   CARDIAC CATHETERIZATION     05/07/12   CARDIOVERSION     x2   COLONOSCOPY     several yrs   DG BIOPSY LUNG Left 10/2014   FNA -  Adenocarcinoma    ESOPHAGOGASTRODUODENOSCOPY (EGD) WITH PROPOFOL N/A 12/21/2019   Procedure: ESOPHAGOGASTRODUODENOSCOPY (EGD) WITH PROPOFOL;  Surgeon: Irving Copas., MD;  Location: [facility];  Service: Gastroenterology;  Laterality: N/A;   ESOPHAGOGASTRODUODENOSCOPY (EGD) WITH PROPOFOL N/A 05/23/2020   Procedure: ESOPHAGOGASTRODUODENOSCOPY (EGD) WITH PROPOFOL;  Surgeon: Rush Landmark Telford Nab., MD;  Location: [facility];  Service: Gastroenterology;  Laterality: N/A;   ESOPHAGOGASTRODUODENOSCOPY (EGD) WITH PROPOFOL N/A 06/30/2020   Procedure: ESOPHAGOGASTRODUODENOSCOPY (EGD) WITH PROPOFOL;  Surgeon: Rush Landmark Telford Nab., MD;  Location: [facility];  Service: Gastroenterology;  Laterality: N/A;  ultra slim scope avail   EUS N/A 12/21/2019   Procedure: UPPER ENDOSCOPIC ULTRASOUND (EUS) RADIAL;  Surgeon: Irving Copas., MD;  Location: [facility];  Service: Gastroenterology;  Laterality: N/A;   FEMUR IM NAIL Left 02/19/2017   FEMUR IM NAIL Left 02/19/2017   Procedure: INTRAMEDULLARY (IM) NAIL FEMORAL;  Surgeon: Marchia Bond, MD;  Location: [place];  Service: Orthopedics;  Laterality: Left;   IR FLUORO GUIDE PORT INSERTION RIGHT  07/03/2017   IR US GUIDE VASC ACCESS RIGHT  07/03/2017   LUNG CANCER SURGERY Left 12/2014   Wedge Resection    MULTIPLE TOOTH EXTRACTIONS     SAVORY  DILATION N/A 05/23/2020   Procedure: SAVORY DILATION;  Surgeon: Rush Landmark Telford Nab., MD;  Location: [facility];  Service: Gastroenterology;  Laterality: N/A;   SAVORY DILATION N/A 06/30/2020   Procedure: SAVORY DILATION;  Surgeon: Rush Landmark Telford Nab., MD;  Location: [facility];  Service: Gastroenterology;  Laterality: N/A;   Status post TURP     TONSILLECTOMY     UPPER GASTROINTESTINAL ENDOSCOPY     Patient Active Problem List   Diagnosis Date Noted   Dysphagia 05/03/2022   T2DM (type 2 diabetes mellitus) 05/03/2022   CHF (congestive heart failure) 01/04/2022   Depression, major,  single episode, moderate 09/05/2021   Paresthesia 09/05/2021   Cough 05/18/2020   Esophagitis 01/31/2020   Exocrine pancreatic insufficiency 01/31/2020   Chronic pancreatitis 01/31/2020   Dyspnea 09/09/2019   Slow transit constipation 08/17/2019   BPH s/p TURP 03/10/2019   Chronic low back pain 02/03/2019   Malignant neoplasm of unspecified part of unspecified bronchus or lung 02/03/2019   Port-A-Cath in place 08/22/2017   Adenocarcinoma of left lung, stage 4 02/26/2017   Metastasis to bone 02/19/2017   Malignant neoplasm metastatic to bone 01/24/2017   Centrilobular emphysema 09/26/2016   Hyperlipidemia 09/26/2016   Anxiety disorder 09/26/2016   Chronic atrial fibrillation 08/15/2016   Chronic obstructive pulmonary disease 08/15/2016   Hypothyroid 08/15/2016    ONSET DATE: Script dated 05/03/22  REFERRING DIAG: R13.10  THERAPY DIAG:  Dysphagia, pharyngeal phase  Rationale for Evaluation and Treatment: Rehabilitation  SUBJECTIVE:   SUBJECTIVE STATEMENT: "It feels like there is a cramp in (lt side of) my throat." (For last 3-4 weeks) Pt cleared throat x15 during session (including x3 after SLP telling pt about throat clearing excessively 32 minutes into session) Pt accompanied by: significant other  PERTINENT HISTORY: Dysphagia Has been following with GI for dysphagia.  Thought to be likely secondary to treatment for cancer.  He has underwent a few different EGDs with dilations though still has persistent issues.  Will place referral to SLP for further evaluation and management.  Does not wish to undergo any more EGDs at this point.  PAIN:  Are you having pain?  Pt reports pain in lt throat/neck as "a cramp" today  FALLS: Has patient fallen in last 6 months?  No  LIVING ENVIRONMENT: Lives with: lives with their spouse Lives in: House/apartment  PLOF:  Level of assistance: Independent with ADLs, Independent with IADLs Employment: Retired  PATIENT GOALS: "Get this  taken care of."  OBJECTIVE:   DIAGNOSTIC FINDINGS:  July 2022 DG Esophagus FINDINGS: Normal esophageal motility. There is a approximately 4 cm long stricture in the mid esophagus with mild mucosal irregularity. No ulceration. Barium tablet did not pass the stricture. There is a small hiatal hernia with moderate gastroesophageal reflux. No aspiration identified. IMPRESSION: Small hiatal hernia with moderate gastroesophageal reflux Long segment stricture with mucosal irregularity in the mid esophagus. Barium tablet did not pass this area. Possible Barrett's esophagus. Recommend endoscopy and biopsy to rule out neoplasm.  COGNITION: Overall cognitive status: Within functional limits for tasks assessed  ORAL MOTOR EXAMINATION: Overall status: Impaired:   Lingual: Bilateral (Strength and Coordination) Comments: Lingual strength to lateral sides appears slightly weak with isometrics, alternate motion laterally to labial margins appears less-coordinated than expected.  CLINICAL SWALLOW ASSESSMENT:   Current diet: regular and thin liquids however is avoiding dense meats Dentition: adequate natural dentition Patient directly observed with POs: Yes: dysphagia 3 (soft), dysphagia 1 (puree), and thin liquids  Feeding: able to  feed self Liquids provided by: cup Oral phase signs and symptoms:  none noted Pharyngeal phase signs and symptoms: delayed throat clear, immediate cough, and complaints of residue Immediate cough with one swallow of water after pt swished in oral cavity.  Delayed throat clear 2/14 boluses, once after applesauce and once after cereal bar and water, were unable to be attributed to pharyngeal dysphagia due to pt throat clearing throughout session. When pt turned head to lt, complained of incr'd residue, with head turn to rt x2 boluses pt reported less residue (still present but less). SLP told pt to turn head to rt and take smaller bites, and recommended MBS to further  assess swallow function objectively.   Pt presents today with mildly hoarse voice, >6 months duration. Could be related to due to chronic throat clearing. May want to consider referral to ENT.  PATIENT REPORTED OUTCOME MEASURES (PROM): EAT-10: provided pt at first therapy session (PRN)   TODAY'S TREATMENT:                                                                                                                                         DATE:  07/02/22: Discussed ramifications of habitual throat clearing and role it my play with pt's mild hoarseness. SLP completed diagnostic therapy to ascertain a posture that may result in more efficient and safer swallowing for pt, based upon his report. (Head turn rt while swallowing). SLP also taught pt effortful swallow.  PATIENT EDUCATION: Education details: see "pt instructions" Person educated: Patient and Spouse Education method: Explanation, Verbal cues, and Handouts Education comprehension: verbalized understanding, returned demonstration, and verbal cues required   ASSESSMENT:  CLINICAL IMPRESSION: Patient is a 72 y.o. male who was seen today for clinical swallow assessment. Pt coughed on one swallow H2O of fourteen. He complained of pharygneal residue consistently and used water wash consistently. See "clinical swallow assessment" above.  If pt's hoarseness does not resolve after concerted effort at throat clearing alternatives (which SLP provided today) may want to consider referral to ENT.  Unsure of etiology of pt's deficits in lingual movement in non-verbal tasks at this time - they may be playing a role in pt's dysphagia.  SLP suggested pt talk further with his PCP about the "cramp" in his lt neck. Lastly SLP taught pt effortful swallow today due to his inquiry into exercises for swallowing.  OBJECTIVE IMPAIRMENTS: include voice disorder and dysphagia. These impairments are limiting patient from effectively communicating at home and in  community and safety when swallowing. Factors affecting potential to achieve goals and functional outcome are  none observed today . Patient will benefit from skilled SLP services to address above impairments and improve overall function.  REHAB POTENTIAL: Good   GOALS: Goals reviewed with patient? Yes  SHORT TERM GOALS: Target date: 08/17/22  Perform swallow exercises with rare min A x2 visits Baseline: Goal status: INITIAL  2.  Follow precautions from MBS with modified independence x3 visits Baseline:  Goal status: INITIAL  3.  Pt will undergo MBS to fully assess swallow function Baseline:  Goal status: INITIAL   LONG TERM GOALS: Target date: 09/10/22  HEP performed with modified indpendence x3 sessions Baseline:  Goal status: INITIAL  2.  Follow precautions from MBS with independence x3 visits Baseline:  Goal status: INITIAL  3.  Improve EAT-10 PROM score Baseline:  Goal status: INITIAL  PLAN:  SLP FREQUENCY: 1-2x/week  SLP DURATION: 8 weeks  PLANNED INTERVENTIONS: Aspiration precaution training, Diet toleration management , Trials of upgraded texture/liquids, SLP instruction and feedback, Compensatory strategies, Patient/family education, Re-evaluation, and swallow exercises    [place], CCC-SLP 07/02/2022, 4:07 PM

## 2022-07-20 ENCOUNTER — Encounter: Payer: Self-pay | Admitting: Emergency Medicine

## 2022-07-20 MED ORDER — STIOLTO RESPIMAT 2.5-2.5 MCG/ACT IN AERS: 2.0000 | INHALATION_SPRAY | Freq: Every day | RESPIRATORY_TRACT | 2 refills | Status: DC

## 2022-07-30 ENCOUNTER — Ambulatory Visit: Payer: Medicare HMO

## 2022-08-02 ENCOUNTER — Encounter: Payer: Self-pay | Admitting: Family Medicine

## 2022-08-02 ENCOUNTER — Ambulatory Visit (INDEPENDENT_AMBULATORY_CARE_PROVIDER_SITE_OTHER): Payer: Medicare HMO | Admitting: Family Medicine

## 2022-08-02 VITALS — BP 107/69 | HR 71 | Temp 97.8°F | Ht 72.0 in | Wt 222.2 lb

## 2022-08-02 DIAGNOSIS — E1169 Type 2 diabetes mellitus with other specified complication: Secondary | ICD-10-CM | POA: Diagnosis not present

## 2022-08-02 DIAGNOSIS — Z7984 Long term (current) use of oral hypoglycemic drugs: Secondary | ICD-10-CM | POA: Diagnosis not present

## 2022-08-02 LAB — POCT GLYCOSYLATED HEMOGLOBIN (HGB A1C): Hemoglobin A1C: 10.7 % — AB (ref 4.0–5.6)

## 2022-08-02 MED ORDER — SYNJARDY 5-500 MG PO TABS: 1.0000 | ORAL_TABLET | Freq: Two times a day (BID) | ORAL | 5 refills | Status: DC

## 2022-08-02 NOTE — Assessment & Plan Note (Addendum)
A1c today 10.7.  I had a lengthy discussion with patient and his wife regarding treatment options.  He has been compliant with metformin 500 mg daily has been doing well with this.  He has made great changes with lifestyle modifications as well.  He does have a history of chronic pancreatitis which is likely contributing to his poor glycemic control.We did discuss referral to endocrinology however they would like to defer for today.  Due to history of pancreatitis we will avoid starting GLP-1 agonist at this time.  Due to comorbidities including CHF he would be a good candidate for stating a SGLT2 inhibitor.  We will start Synjardy 5-500 twice daily.  Discussed side effects.  They will come back in 3 months to recheck A1c.  If A1c is not at goal at that time will need to be referred to endocrinology.

## 2022-08-02 NOTE — Patient Instructions (Signed)
It was very nice to see you today!  Your A1c today is 10.7.  We need to work on getting this lower.  We will switch your metformin to Synjardy 5-500 twice daily.  Please let us know how you are doing with this in a few weeks.  Will also refer you to see the diabetes educator.  Return in about 3 months (around 11/02/2022) for Follow Up.   Take care, Dr Jimmey Ralph  PLEASE NOTE:  If you had any lab tests, please let us know if you have not heard back within a few days. You may see your results on mychart before we have a chance to review them but we will give you a call once they are reviewed by [place].   If we ordered any referrals today, please let us know if you have not heard from their office within the next week.   If you had any urgent prescriptions sent in today, please check with the pharmacy within an hour of our visit to make sure the prescription was transmitted appropriately.   Please try these tips to maintain a healthy lifestyle:  Eat at least 3 REAL meals and 1-2 snacks per day.  Aim for no more than 5 hours between eating.  If you eat breakfast, please do so within one hour of getting up.   Each meal should contain half fruits/vegetables, one quarter protein, and one quarter carbs (no bigger than a computer mouse)  Cut down on sweet beverages. This includes juice, soda, and sweet tea.   Drink at least 1 glass of water with each meal and aim for at least 8 glasses per day  Exercise at least 150 minutes every week.

## 2022-08-02 NOTE — Progress Notes (Signed)
   Victor Huber is a 72 y.o. male who presents today for an office visit.  Assessment/Plan:  Chronic Problems Addressed Today: T2DM (type 2 diabetes mellitus) ([facility]) A1c today 10.7.  I had a lengthy discussion with patient and his wife regarding treatment options.  He has been compliant with metformin 500 mg daily has been doing well with this.  He has made great changes with lifestyle modifications as well.  He does have a history of chronic pancreatitis which is likely contributing to his poor glycemic control.We did discuss referral to endocrinology however they would like to defer for today.  Due to history of pancreatitis we will avoid starting GLP-1 agonist at this time.  Due to comorbidities including CHF he would be a good candidate for stating a SGLT2 inhibitor.  We will start Synjardy 5-500 twice daily.  Discussed side effects.  They will come back in 3 months to recheck A1c.  If A1c is not at goal at that time will need to be referred to endocrinology.     Subjective:  HPI:  See A/p for status of chronic conditions.  Patient is here today for follow-up.  We last saw him over 3 months ago.  A1c at that time was 7.0.  We started metformin 500 mg daily.  Since her last visit he has changed his diet completely.  Cutting down on sugar and fried foods.  Cutting down on carbs.  He has been tolerating metformin well without any side effects.       Objective:  Physical Exam: BP 107/69   Pulse 71   Temp 97.8 F (36.6 C) (Temporal)   Ht 6' (1.829 m)   Wt 222 lb 3.2 oz (100.8 kg)   SpO2 97%   BMI 30.14 kg/m   Wt Readings from Last 3 Encounters:  08/02/22 222 lb 3.2 oz (100.8 kg)  05/22/22 239 lb 9.6 oz (108.7 kg)  05/03/22 235 lb 3.2 oz (106.7 kg)    Gen: No acute distress, resting comfortably Neuro: Grossly normal, moves all extremities Psych: Normal affect and thought content      Katilyn Miltenberger M. Jimmey Ralph, MD 08/02/2022 1:34 PM

## 2022-08-16 ENCOUNTER — Other Ambulatory Visit: Payer: Self-pay

## 2022-08-16 ENCOUNTER — Inpatient Hospital Stay: Payer: Medicare HMO

## 2022-08-16 ENCOUNTER — Other Ambulatory Visit: Payer: Medicare HMO

## 2022-08-16 ENCOUNTER — Inpatient Hospital Stay: Payer: Medicare HMO | Attending: Emergency Medicine

## 2022-08-16 ENCOUNTER — Ambulatory Visit (HOSPITAL_COMMUNITY)
Admission: RE | Admit: 2022-08-16 | Discharge: 2022-08-16 | Disposition: A | Discharge status: Home / Self Care | Payer: Medicare HMO | Source: Ambulatory Visit | Attending: Internal Medicine | Admitting: Internal Medicine

## 2022-08-16 DIAGNOSIS — J432 Centrilobular emphysema: Secondary | ICD-10-CM | POA: Diagnosis not present

## 2022-08-16 DIAGNOSIS — C3490 Malignant neoplasm of unspecified part of unspecified bronchus or lung: Secondary | ICD-10-CM

## 2022-08-16 DIAGNOSIS — Z902 Acquired absence of lung [part of]: Secondary | ICD-10-CM | POA: Insufficient documentation

## 2022-08-16 DIAGNOSIS — N281 Cyst of kidney, acquired: Secondary | ICD-10-CM | POA: Diagnosis not present

## 2022-08-16 DIAGNOSIS — Z95828 Presence of other vascular implants and grafts: Secondary | ICD-10-CM

## 2022-08-16 DIAGNOSIS — Z85118 Personal history of other malignant neoplasm of bronchus and lung: Secondary | ICD-10-CM | POA: Insufficient documentation

## 2022-08-16 DIAGNOSIS — Z9221 Personal history of antineoplastic chemotherapy: Secondary | ICD-10-CM | POA: Insufficient documentation

## 2022-08-16 DIAGNOSIS — Z923 Personal history of irradiation: Secondary | ICD-10-CM | POA: Insufficient documentation

## 2022-08-16 LAB — CMP (CANCER CENTER ONLY)
ALT: 22 U/L (ref 0–44)
AST: 17 U/L (ref 15–41)
Albumin: 4.2 g/dL (ref 3.5–5.0)
Alkaline Phosphatase: 96 U/L (ref 38–126)
Anion gap: 7 (ref 5–15)
BUN: 20 mg/dL (ref 8–23)
CO2: 28 mmol/L (ref 22–32)
Calcium: 9.2 mg/dL (ref 8.9–10.3)
Chloride: 98 mmol/L (ref 98–111)
Creatinine: 0.91 mg/dL (ref 0.61–1.24)
GFR, Estimated: >60 mL/min (ref >60)
Glucose, Bld: 407 mg/dL — ABNORMAL HIGH (ref 70–99)
Potassium: 4.2 mmol/L (ref 3.5–5.1)
Sodium: 133 mmol/L — ABNORMAL LOW (ref 135–145)
Total Bilirubin: 1.1 mg/dL (ref 0.3–1.2)
Total Protein: 6.6 g/dL (ref 6.5–8.1)

## 2022-08-16 LAB — CBC WITH DIFFERENTIAL (CANCER CENTER ONLY)
Abs Immature Granulocytes: 0.02 10*3/uL (ref 0.00–0.07)
Basophils Absolute: 0 10*3/uL (ref 0.0–0.1)
Basophils Relative: 0 %
Eosinophils Absolute: 0 10*3/uL (ref 0.0–0.5)
Eosinophils Relative: 1 %
HCT: 43.7 % (ref 39.0–52.0)
Hemoglobin: 16 g/dL (ref 13.0–17.0)
Immature Granulocytes: 0 %
Lymphocytes Relative: 24 %
Lymphs Abs: 1.7 10*3/uL (ref 0.7–4.0)
MCH: 33.3 pg (ref 26.0–34.0)
MCHC: 36.6 g/dL — ABNORMAL HIGH (ref 30.0–36.0)
MCV: 91 fL (ref 80.0–100.0)
Monocytes Absolute: 0.6 10*3/uL (ref 0.1–1.0)
Monocytes Relative: 8 %
Neutro Abs: 4.7 10*3/uL (ref 1.7–7.7)
Neutrophils Relative %: 67 %
Platelet Count: 170 10*3/uL (ref 150–400)
RBC: 4.8 MIL/uL (ref 4.22–5.81)
RDW: 12.6 % (ref 11.5–15.5)
WBC Count: 7 10*3/uL (ref 4.0–10.5)
nRBC: 0 % (ref 0.0–0.2)

## 2022-08-16 MED ORDER — HEPARIN SOD (PORK) LOCK FLUSH 100 UNIT/ML IV SOLN
500.0000 [IU] | Freq: Once | INTRAVENOUS | Status: AC
  Administered 2022-08-16: 500 [IU] via INTRAVENOUS

## 2022-08-16 MED ORDER — HEPARIN SOD (PORK) LOCK FLUSH 100 UNIT/ML IV SOLN
INTRAVENOUS | Status: AC
  Filled 2022-08-16: qty 5

## 2022-08-16 MED ORDER — SODIUM CHLORIDE 0.9% FLUSH
10.0000 mL | INTRAVENOUS | Status: DC | PRN
  Administered 2022-08-16: 10 mL

## 2022-08-16 MED ORDER — SODIUM CHLORIDE (PF) 0.9 % IJ SOLN
INTRAMUSCULAR | Status: AC
  Filled 2022-08-16: qty 50

## 2022-08-16 MED ORDER — IOHEXOL 300 MG/ML  SOLN
100.0000 mL | Freq: Once | INTRAMUSCULAR | Status: AC | PRN
  Administered 2022-08-16: 100 mL via INTRAVENOUS

## 2022-08-20 ENCOUNTER — Inpatient Hospital Stay (HOSPITAL_BASED_OUTPATIENT_CLINIC_OR_DEPARTMENT_OTHER): Payer: Medicare HMO | Admitting: Internal Medicine

## 2022-08-20 ENCOUNTER — Other Ambulatory Visit: Payer: Self-pay

## 2022-08-20 VITALS — BP 124/78 | HR 60 | Temp 97.9°F | Resp 15 | Wt 216.6 lb

## 2022-08-20 DIAGNOSIS — C3490 Malignant neoplasm of unspecified part of unspecified bronchus or lung: Secondary | ICD-10-CM | POA: Diagnosis not present

## 2022-08-20 DIAGNOSIS — Z9221 Personal history of antineoplastic chemotherapy: Secondary | ICD-10-CM | POA: Diagnosis not present

## 2022-08-20 DIAGNOSIS — Z923 Personal history of irradiation: Secondary | ICD-10-CM | POA: Diagnosis not present

## 2022-08-20 DIAGNOSIS — Z85118 Personal history of other malignant neoplasm of bronchus and lung: Secondary | ICD-10-CM | POA: Diagnosis not present

## 2022-08-20 DIAGNOSIS — Z902 Acquired absence of lung [part of]: Secondary | ICD-10-CM | POA: Diagnosis not present
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
## 2022-09-03 ENCOUNTER — Other Ambulatory Visit: Payer: Self-pay | Admitting: Family Medicine

## 2022-09-12 ENCOUNTER — Other Ambulatory Visit (HOSPITAL_BASED_OUTPATIENT_CLINIC_OR_DEPARTMENT_OTHER): Payer: Self-pay | Admitting: Cardiovascular Disease

## 2022-09-12 DIAGNOSIS — I5020 Unspecified systolic (congestive) heart failure: Secondary | ICD-10-CM

## 2022-09-12 NOTE — Telephone Encounter (Signed)
Rx request sent to pharmacy.  

## 2022-09-13 DIAGNOSIS — H524 Presbyopia: Secondary | ICD-10-CM | POA: Diagnosis not present

## 2022-09-17 ENCOUNTER — Other Ambulatory Visit: Payer: Self-pay | Admitting: *Deleted

## 2022-09-17 ENCOUNTER — Encounter: Payer: Self-pay | Admitting: Dietician

## 2022-09-17 ENCOUNTER — Encounter: Payer: Medicare HMO | Attending: Family Medicine | Admitting: Dietician

## 2022-09-17 VITALS — Ht 72.0 in | Wt 214.0 lb

## 2022-09-17 DIAGNOSIS — E1169 Type 2 diabetes mellitus with other specified complication: Secondary | ICD-10-CM | POA: Diagnosis not present

## 2022-09-17 MED ORDER — GLUCOSE BLOOD VI STRP: ORAL_STRIP | 12 refills | Status: DC

## 2022-09-17 MED ORDER — ACCU-CHEK SOFTCLIX LANCETS MISC: 12 refills | Status: DC

## 2022-09-17 NOTE — Progress Notes (Signed)
Diabetes Self-Management Education  Visit Type: First/Initial  Appt. Start Time: 1405 Appt. End Time: 1520  09/17/2022  [name], identified by name and date of birth, is a 72 y.o. male with a diagnosis of Diabetes: Type 2.   ASSESSMENT Patient is here today with his wife.  He has been trying to lose weight and has lost 8 lbs in the past 6 weeks. He has been mindfully reducing his intake to help control his blood glucose.  - Discussed importance of maintaining nutritional status and  Noted that his A1C increased depite metformin and lifestyle change.  Jardiance was added.   He is not checking his blood glucose.  Blood Glucose monitor was provided :  Accu Chek Guide Me Lot [id number] Expiration 10/19/2023 Patient was able to demonstrate use.  Blood glucose was 180 three hours after a protein shake. Request for strips and lancets sent to his MD.  Referral reason:  Type 2 diabetes History includes:  Type 2 diabetes (2024), dysphagia, chronic pancreatitis, lung cancer (2016) - currently stable, COPD, CHF, depression/anxiety, HTN, HLD Medication includes:  Synjardy, Creon Labs noted to include:  A1C 10.7% 08/02/2022 increased from 9% 04/18/22, C-Peptide 2.96 04/18/2022  Weight hx: 6" 214 lbs 09/17/2022 222 lbs 08/02/2022 225 lbs 05/2022 190 lbs lowest adult weight 210 lbs current goal weight  Moved from [place].  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 Walks, pool walking, exercise bands.  Feels tired frequently. Lower carb diet (does include daily dessert that he does not wish to give up and has decreased this)  Height 6' (1.829 m), weight 214 lb (97.1 kg). Body mass index is 29.02 kg/m.   Diabetes Self-Management Education - 09/17/22 1438       Visit Information   Visit Type First/Initial      Initial Visit   Diabetes Type Type 2    Date Diagnosed 2024    Are you currently following a meal plan? No    Are  you taking your medications as prescribed? Yes      Health Coping   How would you rate your overall health? Fair      Psychosocial Assessment   Patient Belief/Attitude about Diabetes Afraid    Self-care barriers None    Self-management support Doctor's office;Family    Other persons present Patient;Spouse/SO    Patient Concerns Nutrition/Meal planning;Glycemic Control;Healthy Lifestyle    Special Needs None    Preferred Learning Style No preference indicated    Learning Readiness Ready    How often do you need to have someone help you when you read instructions, pamphlets, or other written materials from your doctor or pharmacy? 1 - Never    What is the last grade level you completed in school? 12      Pre-Education Assessment   Patient understands the diabetes disease and treatment process. Needs Instruction    Patient understands incorporating nutritional management into lifestyle. Needs Instruction    Patient undertands incorporating physical activity into lifestyle. Needs Instruction    Patient understands using medications safely. Needs Instruction    Patient understands monitoring blood glucose, interpreting and using results Needs Instruction    Patient understands prevention, detection, and treatment of acute complications. Needs Instruction    Patient understands prevention, detection, and treatment of chronic complications. Needs Instruction    Patient understands how to develop strategies to address psychosocial issues. Needs Instruction    Patient understands how to  develop strategies to promote health/change behavior. Needs Instruction      Complications   Last HgB A1C per patient/outside source 10.7 %   08/02/22 increased from 9% 04/18/2022   How often do you check your blood sugar? 0 times/day (not testing)    Have you had a dilated eye exam in the past 12 months? Yes    Have you had a dental exam in the past 12 months? Yes    Are you checking your feet? Yes    How many  days per week are you checking your feet? 1      Dietary Intake   Breakfast Fairlife Protein shake   11 am   Lunch skips    Snack (afternoon) string cheese or Toast    Dinner lean steak, vegetables which included potatoes, chocolate ice cream cone   4-7   Snack (evening) potato chips, sugar free hard candy    Beverage(s) water, sugar free gingerale, occasional coffee with non-dairy creamer      Activity / Exercise   Activity / Exercise Type Light (walking / raking leaves)    How many days per week do you exercise? 2    How many minutes per day do you exercise? 30    Total minutes per week of exercise 60      Patient Education   Previous Diabetes Education No    Disease Pathophysiology Definition of diabetes, type 1 and 2, and the diagnosis of diabetes    Healthy Eating Role of diet in the treatment of diabetes and the relationship between the three main macronutrients and blood glucose level;Food label reading, portion sizes and measuring food.;Plate Method;Meal options for control of blood glucose level and chronic complications.    Being Active Role of exercise on diabetes management, blood pressure control and cardiac health.;Helped patient identify appropriate exercises in relation to his/her diabetes, diabetes complications and other health issue.    Medications Reviewed patients medication for diabetes, action, purpose, timing of dose and side effects.    Monitoring Taught/evaluated SMBG meter.    Acute complications Taught prevention, symptoms, and  treatment of hypoglycemia - the 15 rule.;Discussed and identified patients' prevention, symptoms, and treatment of hyperglycemia.    Chronic complications Relationship between chronic complications and blood glucose control    Diabetes Stress and Support Identified and addressed patients feelings and concerns about diabetes;Worked with patient to identify barriers to care and solutions;Role of stress on diabetes      Individualized Goals  (developed by patient)   Nutrition General guidelines for healthy choices and portions discussed    Physical Activity Exercise 3-5 times per week;15 minutes per day    Medications take my medication as prescribed    Monitoring  Test my blood glucose as discussed    Problem Solving Addressing barriers to behavior change    Reducing Risk do foot checks daily;treat hypoglycemia with 15 grams of carbs if blood glucose less than 70mg /dL      Post-Education Assessment   Patient understands the diabetes disease and treatment process. Demonstrates understanding / competency    Patient understands incorporating nutritional management into lifestyle. Comprehends key points    Patient undertands incorporating physical activity into lifestyle. Demonstrates understanding / competency    Patient understands using medications safely. Demonstrates understanding / competency    Patient understands monitoring blood glucose, interpreting and using results Demonstrates understanding / competency    Patient understands prevention, detection, and treatment of acute complications. Demonstrates understanding / competency  Patient understands prevention, detection, and treatment of chronic complications. Demonstrates understanding / competency    Patient understands how to develop strategies to address psychosocial issues. Demonstrates understanding / competency    Patient understands how to develop strategies to promote health/change behavior. Comprehends key points      Outcomes   Expected Outcomes Demonstrated interest in learning. Expect positive outcomes    Future DMSE PRN    Program Status Completed             Individualized Plan for Diabetes Self-Management Training:   Learning Objective:  Patient will have a greater understanding of diabetes self-management. Patient education plan is to attend individual and/or group sessions per assessed needs and concerns.   Plan:   Patient Instructions   Continue to stay as active as you feel well doing  Bands, pool, walking  Plan:  Aim for 3-4 Carb Choices per meal (45-60 grams) +/- 1 either way  Aim for 0-2 Carbs per snack if hungry  Include protein in moderation with your meals and snacks Consider reading food labels for Total Carbohydrate of foods Consider  increasing your activity level by (see above) for (as tolerated) minutes daily as tolerated Consider checking BG at alternate times per day  Consider taking medication as directed by MD    Expected Outcomes:  Demonstrated interest in learning. Expect positive outcomes  Education material provided: ADA - How to Thrive: A Guide for Your Journey with Diabetes, Meal plan card, Snack sheet, and Diabetes Resources  If problems or questions, patient to contact team via:  Phone  Future DSME appointment: PRN

## 2022-09-17 NOTE — Patient Instructions (Signed)
Continue to stay as active as you feel well doing  Bands, pool, walking  Plan:  Aim for 3-4 Carb Choices per meal (45-60 grams) +/- 1 either way  Aim for 0-2 Carbs per snack if hungry  Include protein in moderation with your meals and snacks Consider reading food labels for Total Carbohydrate of foods Consider  increasing your activity level by (see above) for (as tolerated) minutes daily as tolerated Consider checking BG at alternate times per day  Consider taking medication as directed by MD

## 2022-09-24 NOTE — Progress Notes (Unsigned)
Cardiology Office Note:  .   Date:  09/25/2022  ID:  Victor Huber, DOB 1950/11/13, MRN [id number] PCP: [name], MD  [facility] Providers Cardiologist:  Chilton Si, MD    History of Present Illness: Victor Huber   Victor Huber is a 72 y.o. male with history of persistent atrial fibrillation, diabetes, COPD, hypothyroidism and metastatic non-small cell lung cancer s/p resection and on chemotherapy and prior tobacco abuse, he is here today for follow up. Previously diagnosed with atrial fibrillation in 2012, now chronic. Previously on amiodarone, discontinued when diagnosed with lung cancer in 2016. Following he was switched to sotalol but failed subsequent DCCV. He then tried flecainide but developed a WCT with subsequent cardiac arrest. Previously switched from metoprolol to atenolol, due to fatigue and having previously tolerating atenolol better. Referred to the atrial fibrillation clinic, discussed dofetilide, amiodarone and ablation. Not felt to be a good candidate for ablation, declined antiarrhythmics due to cost.   He had a nuclear stress test 03/2020 that revealed multiple infarcts but no ischemia. He was seen in Afib clinic 12/2020 after he was found in afib RVR following an endocscopy, his atenolol had been held that morning, it was recommended he follow his regiment at that time.   At OV on 10/2021 he reported increased shortness of breath, found in atrial fibrillation with RVR. Switched atenolol to metoprolol, his TSH was abnormal. Echo reveal LVEF of 40-45% with global hypokinesis and mild LVH. RV function mildly reduced. Following he reported increased shortness of breath, he was switched to bisoprolol and diuresed with lasix for fluid overload. In 01/2022 he was started on entresto.   His last OV was on 05/22/22. At that time he was noting worsening dyspnea and low energy levels. He was barely able to walk half a block without needing to stop. He was noting his "heart  skipping" frequently. He had recently been diagnosed with diabetes, had started monitoring his carb and sugar intake. He was started on Digoxin 0.125mg  daily.   Today he reports he has been doing better, dyspnea has improved but still occurs with exercise. Feels this is related to his atrial fibrillation. He has been trying to exercise more with resistance bands and improving his diet following his diagnosis of diabetes. He denies anginal symptoms.   ROS: He denies chest pain, palpitations, dyspnea, pnd, orthopnea, dizziness, syncope, edema, weight gain, or early satiety. All other systems reviewed and are otherwise negative except as noted above.    Studies Reviewed: .       Cardiac Studies & Procedures     STRESS TESTS  MYOCARDIAL PERFUSION IMAGING 03/31/2020  Narrative  Defect 1: There is a small defect of mild severity present in the basal inferolateral and mid inferolateral location that improves with stress suggestive of artifact.  Defect 2: There is a small defect of mild severity present in the apical inferior and apex location in rest and stress- infarct is on differential.  Defect 3: There is a small defect of mild severity present in the basal inferoseptal and basal inferior location in rest and stress- infarct is on differential.  This is an intermediate risk study given multiple small defects.  Findings consistent with prior myocardial infarction or sub diaphragmatic attenuation (unable to assess gating of walls in the setting of atrial fibrillation).  Possible inferior infarct.  Intermediate risk study.   ECHOCARDIOGRAM  ECHOCARDIOGRAM COMPLETE 12/08/2021  Narrative ECHOCARDIOGRAM REPORT    Patient Name:   Victor Huber  Date of Exam: 12/07/2021 Medical Rec #:  [id number]         Height:       73.0 in Accession #:    [id number]        Weight:       234.7 lb Date of Birth:  1951/02/08         BSA:          2.303 m Patient Age:    71 years          BP:            120/60 mmHg Patient Gender: M                 HR:           103 bpm. Exam Location:  Outpatient  Procedure: 2D Echo, 3D Echo, Color Doppler, Cardiac Doppler and Strain Analysis  Indications:    Persistent atrial fibrillation  History:        Patient has prior history of Echocardiogram examinations, most recent 01/02/2016. COPD, Arrythmias:Atrial Fibrillation; Risk Factors:Former Smoker and Dyslipidemia. Metastatic non-small cell lung cancer s/p resection.  Sonographer:    Jeryl Columbia RDCS Referring Phys: [id number] TIFFANY [place]  IMPRESSIONS   1. Techinically difficult study. Left ventricular ejection fraction, by estimation, is 40 to 45%. The left ventricle has mildly decreased function. The left ventricle demonstrates global hypokinesis. There is mild left ventricular hypertrophy. Left ventricular diastolic parameters are indeterminate. 2. Right ventricular systolic function is mildly reduced. The right ventricular size is moderately enlarged. Tricuspid regurgitation signal is inadequate for assessing PA pressure. 3. The mitral valve is normal in structure. Trivial mitral valve regurgitation. No evidence of mitral stenosis. 4. The aortic valve was not well visualized. Aortic valve regurgitation is not visualized. Aortic valve sclerosis/calcification is present, without any evidence of aortic stenosis. 5. The inferior vena cava is normal in size with greater than 50% respiratory variability, suggesting right atrial pressure of 3 mmHg.  FINDINGS Left Ventricle: Left ventricular ejection fraction, by estimation, is 40 to 45%. The left ventricle has mildly decreased function. The left ventricle demonstrates global hypokinesis. The left ventricular internal cavity size was normal in size. There is mild left ventricular hypertrophy. Left ventricular diastolic parameters are indeterminate.  Right Ventricle: The right ventricular size is moderately enlarged. Right vetricular wall thickness  was not well visualized. Right ventricular systolic function is mildly reduced. Tricuspid regurgitation signal is inadequate for assessing PA pressure.  Left Atrium: Left atrial size was normal in size.  Right Atrium: Right atrial size was normal in size.  Pericardium: Trivial pericardial effusion is present.  Mitral Valve: The mitral valve is normal in structure. Trivial mitral valve regurgitation. No evidence of mitral valve stenosis.  Tricuspid Valve: The tricuspid valve is normal in structure. Tricuspid valve regurgitation is trivial.  Aortic Valve: The aortic valve was not well visualized. Aortic valve regurgitation is not visualized. Aortic valve sclerosis/calcification is present, without any evidence of aortic stenosis.  Pulmonic Valve: The pulmonic valve was not well visualized. Pulmonic valve regurgitation is not visualized.  Aorta: The aortic root and ascending aorta are structurally normal, with no evidence of dilitation.  Venous: The inferior vena cava is normal in size with greater than 50% respiratory variability, suggesting right atrial pressure of 3 mmHg.  IAS/Shunts: The interatrial septum was not well visualized.   LEFT VENTRICLE PLAX 2D LVIDd:         5.57 cm   Diastology LVIDs:  4.39 cm   LV e' medial:    8.92 cm/s LV PW:         1.22 cm   LV E/e' medial:  9.2 LV IVS:        1.02 cm   LV e' lateral:   8.38 cm/s LVOT diam:     2.20 cm   LV E/e' lateral: 9.8 LV SV:         38 LV SV Index:   16 LVOT Area:     3.80 cm  3D Volume EF: 3D EF:        43 % LV EDV:       143 ml LV ESV:       82 ml LV SV:        61 ml  RIGHT VENTRICLE RV Basal diam:  5.19 cm RV Mid diam:    4.01 cm RV S prime:     10.70 cm/s TAPSE (M-mode): 2.3 cm  LEFT ATRIUM             Index        RIGHT ATRIUM           Index LA diam:        5.50 cm 2.39 cm/m   RA Area:     16.20 cm LA Vol (A2C):   78.2 ml 33.96 ml/m  RA Volume:   42.00 ml  18.24 ml/m LA Vol (A4C):   54.5 ml  23.66 ml/m LA Biplane Vol: 66.6 ml 28.92 ml/m AORTIC VALVE LVOT Vmax:   64.00 cm/s LVOT Vmean:  42.700 cm/s LVOT VTI:    0.100 m  AORTA Ao Root diam: 3.40 cm Ao Asc diam:  3.60 cm  MITRAL VALVE MV Area (PHT): 3.61 cm    SHUNTS MV Decel Time: 210 msec    Systemic VTI:  0.10 m MV E velocity: 81.80 cm/s  Systemic Diam: 2.20 cm  Epifanio Lesches MD Electronically signed by Epifanio Lesches MD Signature Date/Time: 12/08/2021/12:41:00 PM    Final              Risk Assessment/Calculations:    CHA2DS2-VASc Score = 4   This indicates a 4.8% annual risk of stroke. The patient's score is based upon: CHF History: 1 HTN History: 0 Diabetes History: 1 Stroke History: 0 Vascular Disease History: 1 Age Score: 1 Gender Score: 0            Physical Exam:   VS:  BP 110/64 (BP Location: Right Arm, Patient Position: Sitting, Cuff Size: Large)   Pulse 86   Ht 6' (1.829 m)   Wt 217 lb 1.6 oz (98.5 kg)   BMI 29.44 kg/m    Wt Readings from Last 3 Encounters:  09/25/22 217 lb 1.6 oz (98.5 kg)  09/17/22 214 lb (97.1 kg)  08/20/22 216 lb 9.6 oz (98.2 kg)    GEN: Well nourished, well developed in no acute distress NECK: No JVD; No carotid bruits CARDIAC: irregular rate and rhythm, no murmurs, rubs, gallops RESPIRATORY:  Clear to auscultation without rales, wheezing or rhonchi  ABDOMEN: Soft, non-tender, non-distended EXTREMITIES:  No edema; No deformity   EKG 09/25/22: Atrial fibrillation at 86bpm with incomplete right bundle branch block   ASSESSMENT AND PLAN: .   Chronic atrial fibrillation/hypercoagulable state:Rate uncontrolled at last office visit in February, started on Digoxin 0.125mg  daily. EKG today shows atrial fibrillation at 86bpm. Now only short of breath with exertion. Continue eliquis 5mg  twice daily, digoxin, and bisoprolol.  CHA2DS2-VASc Score = 4 ( CHF history, Diabetes history, vascular disease history, age)  Digoxin level 05/31/22: 0.4  HFmrEF: 12/2021  EF 40-45%, RWMA (prior 2017 45-50%). Started on synjardy for diabetes by PCP in February. Euvolemic and well compensated on exam. Low sodium diet, fluid restriction <2L, and daily weights encouraged. Educated to contact our office for weight gain of 2 lbs overnight or 5 lbs in one week. GDMT bisoprolol, digoxin, empagliflozin (as part of synjardy per PCP). Previously did not note benefit with Entresto and has previously discontinued.  Diabetes mellitus: Diagnosed in February 2024, recently started on Synjardy 5-500 twice daily.   COPD/Metastatic lung adenocarcinoma: Follows with pulmonology and oncology.   Hypothyroidism: Continue to follow with PCP.        Dispo: Follow up with Dr. Duke Salvia or Gillian Shields, NP in 3 months.   Signed, Rip Harbour, NP

## 2022-09-25 ENCOUNTER — Encounter (HOSPITAL_BASED_OUTPATIENT_CLINIC_OR_DEPARTMENT_OTHER): Payer: Self-pay | Admitting: Cardiology

## 2022-09-25 ENCOUNTER — Ambulatory Visit (HOSPITAL_BASED_OUTPATIENT_CLINIC_OR_DEPARTMENT_OTHER): Payer: Medicare HMO | Admitting: Cardiology

## 2022-09-25 VITALS — BP 110/64 | HR 86 | Ht 72.0 in | Wt 217.1 lb

## 2022-09-25 DIAGNOSIS — I482 Chronic atrial fibrillation, unspecified: Secondary | ICD-10-CM

## 2022-09-25 DIAGNOSIS — Z7984 Long term (current) use of oral hypoglycemic drugs: Secondary | ICD-10-CM

## 2022-09-25 DIAGNOSIS — J441 Chronic obstructive pulmonary disease with (acute) exacerbation: Secondary | ICD-10-CM | POA: Diagnosis not present

## 2022-09-25 DIAGNOSIS — E032 Hypothyroidism due to medicaments and other exogenous substances: Secondary | ICD-10-CM

## 2022-09-25 DIAGNOSIS — D6859 Other primary thrombophilia: Secondary | ICD-10-CM

## 2022-09-25 DIAGNOSIS — I5042 Chronic combined systolic (congestive) and diastolic (congestive) heart failure: Secondary | ICD-10-CM

## 2022-09-25 DIAGNOSIS — E1169 Type 2 diabetes mellitus with other specified complication: Secondary | ICD-10-CM | POA: Diagnosis not present

## 2022-09-25 NOTE — Patient Instructions (Signed)
Medication Instructions:  Your physician recommends that you continue on your current medications as directed. Please refer to the Current Medication list given to you today.  *If you need a refill on your cardiac medications before your next appointment, please call your pharmacy*  Follow-Up: At [facility], you and your health needs are our priority.  As part of our continuing mission to provide you with exceptional heart care, we have created designated Provider Care Teams.  These Care Teams include your primary Cardiologist (physician) and Advanced Practice Providers (APPs -  Physician Assistants and Nurse Practitioners) who all work together to provide you with the care you need, when you need it.  We recommend signing up for the patient portal called "MyChart".  Sign up information is provided on this After Visit Summary.  MyChart is used to connect with patients for Virtual Visits (Telemedicine).  Patients are able to view lab/test results, encounter notes, upcoming appointments, etc.  Non-urgent messages can be sent to your provider as well.   To learn more about what you can do with MyChart, go to [email or web address].    Your next appointment:   3 months with Dr. Duke Salvia or Gillian Shields, NP

## 2022-11-01 ENCOUNTER — Ambulatory Visit: Payer: Medicare HMO | Admitting: Emergency Medicine

## 2022-11-01 ENCOUNTER — Encounter: Payer: Self-pay | Admitting: Emergency Medicine

## 2022-11-01 VITALS — BP 116/72 | HR 77 | Ht 72.0 in | Wt 216.0 lb

## 2022-11-01 DIAGNOSIS — C3492 Malignant neoplasm of unspecified part of left bronchus or lung: Secondary | ICD-10-CM

## 2022-11-01 DIAGNOSIS — J441 Chronic obstructive pulmonary disease with (acute) exacerbation: Secondary | ICD-10-CM | POA: Diagnosis not present

## 2022-11-01 MED ORDER — ALBUTEROL SULFATE HFA 108 (90 BASE) MCG/ACT IN AERS: 2.0000 | INHALATION_SPRAY | Freq: Four times a day (QID) | RESPIRATORY_TRACT | 3 refills | Status: DC | PRN

## 2022-11-01 NOTE — Assessment & Plan Note (Signed)
Following with Dr. Arbutus Ped.  Currently on observation.  He is getting serial imaging every 6 months.

## 2022-11-01 NOTE — Progress Notes (Signed)
Subjective:    Patient ID: Victor Huber, male    DOB: 1950-07-07, 72 y.o.   MRN: [id number]  HPI  ROV 11/01/2022 --follow-up visit for 71 year old man with a history of COPD and stage IV lung adenocarcinoma.  He is undergone left lower lobe wedge resection, chemotherapy.  He was on maintenance Keytruda but this had to be stopped given concern for possible associated pancreatitis.  He has mixed obstruction and restriction on his pulmonary function testing.  He was having persistent dyspnea at our last visit and I changed Sp[employer] to [employer].  Walking oximetry 03/2022 did not show desaturation on room air. PMH also significant for chronic A-fib, diabetes, hypothyroidism Today he reports that his breathing is better. His exertional tolerance is better. He has lost 15 lbs intentionally, has benefited from this as well. Exercising some. He doesn't have an albuterol.   CT chest 08/16/2022 reviewed by me shows a 1.1 cm subcarinal node, slightly smaller than prior.  No other significant lymphadenopathy.  Chronic masslike area of architectural distortion in the perihilar left lower lobe similar to priors.  Diffuse bronchial wall thickening and moderate to severe centrilobular emphysema.   Review of Systems  Constitutional:  Negative for fever and unexpected weight change.  HENT:  Negative for congestion, dental problem, ear pain, nosebleeds, postnasal drip, rhinorrhea, sinus pressure, sneezing, sore throat and trouble swallowing.   Eyes:  Negative for redness and itching.  Respiratory:  Positive for cough and shortness of breath. Negative for chest tightness and wheezing.   Cardiovascular:  Positive for palpitations. Negative for leg swelling.  Gastrointestinal:  Negative for nausea and vomiting.  Genitourinary:  Negative for dysuria.  Musculoskeletal:  Negative for joint swelling.  Skin:  Positive for rash.  Neurological:  Negative for headaches.  Hematological:  Does not bruise/bleed easily.   Psychiatric/Behavioral:  Negative for dysphoric mood. The patient is not nervous/anxious.     Past Medical History:  Diagnosis Date   Adenocarcinoma of left lung, stage 3 (HCC) 08/15/2016   Allergy    Anxiety    Arthritis    Atrial fibrillation (HCC) 08/15/2016   Atrial fibrillation (HCC)    Cancer, metastatic to bone [facility])    lung   Cataract    Waiting to schedule bilateral cataract surgery   CHF (congestive heart failure) (HCC) 01/04/2022   COPD (chronic obstructive pulmonary disease) (HCC) 08/15/2016   Depression    Diabetes mellitus, type 2 (HCC)    Dysphagia    Dysrhythmia    a-fib   GERD (gastroesophageal reflux disease)    Heart murmur    atenlol for A Fib/Eliquis   History of chemotherapy    History of radiation therapy    Hyperlipidemia    Hypertension    Hypothyroid 08/15/2016   Longstanding persistent atrial fibrillation (HCC) 08/29/2016   Pathologic fracture    left femur   Pneumonitis    S/P TURP 08/15/2016   T2DM (type 2 diabetes mellitus) (HCC) 05/03/2022   Wears glasses    Wears hearing aid in both ears      Family History  Problem Relation Age of Onset   Breast cancer Mother    Breast cancer Maternal Aunt    Breast cancer Maternal Aunt    Lung disease Neg Hx    Colon cancer Neg Hx    Stomach cancer Neg Hx    Pancreatic cancer Neg Hx    Rectal cancer Neg Hx    Colon polyps Neg Hx  Esophageal cancer Neg Hx      Social History   Socioeconomic History   Marital status: Married    Spouse name: Not on file   Number of children: Not on file   Years of education: Not on file   Highest education level: Not on file  Occupational History    Comment: retired   Tobacco Use   Smoking status: Former    Current packs/day: 0.00    Average packs/day: 1 pack/day for 50.0 years (50.0 ttl pk-yrs)    Types: Cigarettes    Start date: 08/16/1962    Quit date: 08/15/2012    Years since quitting: 10.2   Smokeless tobacco: Never  Vaping Use   Vaping  status: Never Used  Substance and Sexual Activity   Alcohol use: No   Drug use: No   Sexual activity: Not on file  Other Topics Concern   Not on file  Social History Narrative   [facility] (09/26/16):   Originally from [place]. Moved to [facility] February 2018. Always lived in [place]. Moved to be closer to children & grandchildren. No international travel. 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 No mold exposure. No bird exposure or hot tub exposure. Enjoys reading. Previously enjoyed wood working with domestic woods.    Social Determinants of Health   Financial Resource Strain: Low Risk  (01/19/2022)   Overall Financial Resource Strain (CARDIA)    Difficulty of Paying Living Expenses: Not hard at all  Food Insecurity: No Food Insecurity (01/19/2022)   Hunger Vital Sign    Worried About Running Out of Food in the Last Year: Never true    Ran Out of Food in the Last Year: Never true  Transportation Needs: No Transportation Needs (01/19/2022)   PRAPARE - Administrator, Civil Service (Medical): No    Lack of Transportation (Non-Medical): No  Physical Activity: Inactive (01/19/2022)   Exercise Vital Sign    Days of Exercise per Week: 0 days    Minutes of Exercise per Session: 0 min  Stress: No Stress Concern Present (01/19/2022)   [employer] of Occupational Health - Occupational Stress Questionnaire    Feeling of Stress : Not at all  Social Connections: Socially Isolated (01/19/2022)   Social Connection and Isolation Panel [NHANES]    Frequency of Communication with Friends and Family: Once a week    Frequency of Social Gatherings with Friends and Family: Once a week    Attends Religious Services: Never    Database administrator or Organizations: No    Attends Banker Meetings: Never    Marital Status: Married  Catering manager Violence: Not At Risk (01/19/2022)   Humiliation,  Afraid, Rape, and Kick questionnaire    Fear of Current or Ex-Partner: No    Emotionally Abused: No    Physically Abused: No    Sexually Abused: No  Lots of sawdust exposure.  No military  Allergies  Allergen Reactions   Carboplatin Itching, Nausea And Vomiting and Other (See Comments)    Flushing   Flecainide Hypertension    CAUSED HEART ISSUES    Debrox [Carbamide Peroxide]     Swelling in ear canal.      Outpatient Medications Prior to Visit  Medication Sig Dispense Refill   Accu-Chek Softclix Lancets lancets Use as instructed 100 each 12   acetaminophen (TYLENOL) 500 MG tablet Take 500 mg by mouth every 8 (eight)  hours as needed for moderate pain.     apixaban (ELIQUIS) 5 MG TABS tablet TAKE 1 TABLET TWICE DAILY 180 tablet 1   atorvastatin (LIPITOR) 40 MG tablet TAKE 1 TABLET EVERY DAY 90 tablet 3   bisoprolol (ZEBETA) 5 MG tablet TAKE 1 TABLET EVERY DAY 90 tablet 3   diazepam (VALIUM) 5 MG tablet Take 1 tablet (5 mg total) by mouth every 12 (twelve) hours as needed for anxiety. 30 tablet 1   digoxin (LANOXIN) 0.125 MG tablet Take 1 tablet (0.125 mg total) by mouth daily. 90 tablet 3   DULoxetine (CYMBALTA) 60 MG capsule TAKE 1 CAPSULE EVERY DAY 90 capsule 3   Empagliflozin-metFORMIN HCl (SYNJARDY) 5-500 MG TABS Take 1 tablet by mouth in the morning and at bedtime. 60 tablet 5   esomeprazole (NEXIUM) 40 MG capsule TAKE 1 CAPSULE (40 MG TOTAL) BY MOUTH 2 (TWO) TIMES DAILY BEFORE A MEAL. 180 capsule 10   gabapentin (NEURONTIN) 300 MG capsule TAKE 2 CAPSULES AT BEDTIME AS NEEDED 180 capsule 3   glucose blood test strip Use as instructed 100 each 12   levothyroxine (SYNTHROID) 200 MCG tablet TAKE 1 TABLET ONE TIME DAILY BEFORE BREAKFAST 90 tablet 10   lipase/protease/amylase (CREON) 36000 UNITS CPEP capsule Take 4 capsules po during each meal. Take 1-2 capsules po during each snack.( up to 2 snacks daily). 480 capsule 3   sucralfate (CARAFATE) 1 g tablet TAKE 1 TABLET FOUR TIMES  DAILY WITH MEALS AND AT BEDTIME 360 tablet 1   tamsulosin (FLOMAX) 0.4 MG CAPS capsule TAKE 1 CAPSULE EVERY DAY 90 capsule 10   Tiotropium Bromide-Olodaterol (STIOLTO RESPIMAT) 2.5-2.5 MCG/ACT AERS Inhale 2 puffs into the lungs daily. 12 g 2   No facility-administered medications prior to visit.         Objective:   Physical Exam  Today's Vitals   11/01/22 1438  BP: 116/72  Pulse: 77  SpO2: 96%  Weight: 216 lb (98 kg)  Height: 6' (1.829 m)    Body mass index is 29.29 kg/m.   Gen: Pleasant, overweight man, in no distress,  normal affect  ENT: No lesions,  mouth clear,  oropharynx clear, no postnasal drip  Neck: No JVD, no stridor  Lungs: No use of accessory muscles, good air movement, no crackles, no wheezing on a normal breath, no wheeze on a forced exp.   Cardiovascular: RRR, heart sounds normal, no murmur or gallops, no peripheral edema  Musculoskeletal: No deformities, no cyanosis or clubbing  Neuro: alert, awake, non focal  Skin: Warm, no lesions or rash     Assessment & Plan:  Chronic obstructive pulmonary disease (HCC) Overall doing well.  He is working on exercise and weight loss, conditioning.  He has benefited from the change to [facility] and we will continue this.  No flares.  He needs an albuterol inhaler to have available to use as needed.  He is good about getting his vaccinations, encouraged him to get the flu shot and the COVID-19 vaccine this fall.  His pneumococcal vaccine is up-to-date.  Adenocarcinoma of left lung, stage 4 (HCC) Following with Dr. Arbutus Ped.  Currently on observation.  He is getting serial imaging every 6 months.     Levy Pupa, MD, PhD 11/01/2022, 2:54 PM [facility] [phone] or if no answer [phone]

## 2022-11-01 NOTE — Assessment & Plan Note (Signed)
Overall doing well.  He is working on exercise and weight loss, conditioning.  He has benefited from the change to [facility] and we will continue this.  No flares.  He needs an albuterol inhaler to have available to use as needed.  He is good about getting his vaccinations, encouraged him to get the flu shot and the COVID-19 vaccine this fall.  His pneumococcal vaccine is up-to-date.

## 2022-11-01 NOTE — Patient Instructions (Addendum)
Please continue Stiolto 2 puffs once daily. Keep albuterol available to use 2 puffs up to every 4 hours if needed for shortness of breath, chest tightness, wheezing.  Get your flu shot and your COVID shot this fall Continue to follow with Dr. Arbutus Ped and get your repeat CT scans as per his plans. Follow with Dr. Delton Coombes in 1 year or sooner if you have any problems.

## 2022-11-06 ENCOUNTER — Encounter: Payer: Self-pay | Admitting: Family Medicine

## 2022-11-06 ENCOUNTER — Ambulatory Visit (INDEPENDENT_AMBULATORY_CARE_PROVIDER_SITE_OTHER): Payer: Medicare HMO | Admitting: Family Medicine

## 2022-11-06 VITALS — BP 95/68 | HR 60 | Temp 98.0°F | Ht 72.0 in | Wt 215.2 lb

## 2022-11-06 DIAGNOSIS — F419 Anxiety disorder, unspecified: Secondary | ICD-10-CM | POA: Diagnosis not present

## 2022-11-06 DIAGNOSIS — E1169 Type 2 diabetes mellitus with other specified complication: Secondary | ICD-10-CM

## 2022-11-06 DIAGNOSIS — F321 Major depressive disorder, single episode, moderate: Secondary | ICD-10-CM | POA: Diagnosis not present

## 2022-11-06 DIAGNOSIS — Z1159 Encounter for screening for other viral diseases: Secondary | ICD-10-CM | POA: Diagnosis not present

## 2022-11-06 DIAGNOSIS — E032 Hypothyroidism due to medicaments and other exogenous substances: Secondary | ICD-10-CM

## 2022-11-06 DIAGNOSIS — Z7984 Long term (current) use of oral hypoglycemic drugs: Secondary | ICD-10-CM

## 2022-11-06 LAB — POCT GLYCOSYLATED HEMOGLOBIN (HGB A1C): Hemoglobin A1C: 8.7 % — AB (ref 4.0–5.6)

## 2022-11-06 MED ORDER — BUPROPION HCL ER (XL) 150 MG PO TB24: 150.0000 mg | ORAL_TABLET | Freq: Every day | ORAL | 3 refills | Status: DC

## 2022-11-06 NOTE — Assessment & Plan Note (Signed)
Symptoms are not controlled.  On Cymbalta 60 mg daily but is having worsening anxiety and depression symptoms.  He has Valium very infrequently as needed.  He is not sure if his symptoms are more anxiety or depression predominant.  Will place referral to therapist.  He has done well with this previously in the past.  We did discuss medication changes today as well.  Has been on several other SSRIs and SNRIs in the past without much improvement.  Previously has done well with Wellbutrin to improve his depression symptoms.  Will add this on today at 150 mg daily.  He is aware of potential side effects.  He will follow-up in a few weeks via MyChart and we can titrate the dose as needed.

## 2022-11-06 NOTE — Assessment & Plan Note (Signed)
See depression A/P.  Continue Cymbalta 60 mg daily and Valium as needed.  We are adding on Wellbutrin for his depression symptoms.  He will follow-up me in a few weeks and we can adjust meds as needed.  Will be placing referral to therapy today as well.

## 2022-11-06 NOTE — Progress Notes (Signed)
   Victor Huber is a 72 y.o. male who presents today for an office visit.  Assessment/Plan:  New/Acute Problems: Neck Pain  Possibly muscular strain.  We discussed imaging and referrals however he declined for today.  He mostly wishes to make sure is nothing going on with his thyroid.  Will check labs today.  He will let us know if symptoms change or if he wishes to pursue further workup.  Chronic Problems Addressed Today: T2DM (type 2 diabetes mellitus) (HCC) A1c improved to 8.7.  We will continue Synjardy 5-500 twice daily.  He will continue working on lifestyle modifications.  Recheck A1c in 3 months.  If not at goal we will increase dose of Synjardy.  Anxiety disorder See depression A/P.  Continue Cymbalta 60 mg daily and Valium as needed.  We are adding on Wellbutrin for his depression symptoms.  He will follow-up me in a few weeks and we can adjust meds as needed.  Will be placing referral to therapy today as well.  Depression, major, single episode, moderate (HCC) Symptoms are not controlled.  On Cymbalta 60 mg daily but is having worsening anxiety and depression symptoms.  He has Valium very infrequently as needed.  He is not sure if his symptoms are more anxiety or depression predominant.  Will place referral to therapist.  He has done well with this previously in the past.  We did discuss medication changes today as well.  Has been on several other SSRIs and SNRIs in the past without much improvement.  Previously has done well with Wellbutrin to improve his depression symptoms.  Will add this on today at 150 mg daily.  He is aware of potential side effects.  He will follow-up in a few weeks via MyChart and we can titrate the dose as needed.  Hypothyroid On Synthroid 200 mcg daily.  Check TSH.     Subjective:  HPI:  See A/P for status of chronic condition.  Patient is here today for follow-up.  Was last seen here 3 months ago.  At our last visit his A1c was 10.7.  We started  Synjardy 5-500 twice daily in place of his metformin 500 mg daily.  He has been having neck pain for the last several months. This started happening randomly. Feels like an intense spasm. Usually when turning head to the left. Comes and goes.        Objective:  Physical Exam: BP 95/68   Pulse 60   Temp 98 F (36.7 C) (Temporal)   Ht 6' (1.829 m)   Wt 215 lb 3.2 oz (97.6 kg)   SpO2 95%   BMI 29.19 kg/m   Gen: No acute distress, resting comfortably Neuro: Grossly normal, moves all extremities Psych: Normal affect and thought content       M. Jimmey Ralph, MD 11/06/2022 2:28 PM

## 2022-11-06 NOTE — Patient Instructions (Signed)
It was very nice to see you today!  Your sugar looks better.  Please continue to work on your diet and exercise.  We will start Wellbutrin.  Sending message in a few weeks to let me know if this is working for you.  I will also refer you to see a therapist.  Will check blood work today.  Return in about 3 months (around 02/06/2023) for Follow Up.   Take care, Dr Jimmey Ralph  PLEASE NOTE:  If you had any lab tests, please let us know if you have not heard back within a few days. You may see your results on mychart before we have a chance to review them but we will give you a call once they are reviewed by [place].   If we ordered any referrals today, please let us know if you have not heard from their office within the next week.   If you had any urgent prescriptions sent in today, please check with the pharmacy within an hour of our visit to make sure the prescription was transmitted appropriately.   Please try these tips to maintain a healthy lifestyle:  Eat at least 3 REAL meals and 1-2 snacks per day.  Aim for no more than 5 hours between eating.  If you eat breakfast, please do so within one hour of getting up.   Each meal should contain half fruits/vegetables, one quarter protein, and one quarter carbs (no bigger than a computer mouse)  Cut down on sweet beverages. This includes juice, soda, and sweet tea.   Drink at least 1 glass of water with each meal and aim for at least 8 glasses per day  Exercise at least 150 minutes every week.

## 2022-11-06 NOTE — Assessment & Plan Note (Signed)
A1c improved to 8.7.  We will continue Synjardy 5-500 twice daily.  He will continue working on lifestyle modifications.  Recheck A1c in 3 months.  If not at goal we will increase dose of Synjardy.

## 2022-11-06 NOTE — Assessment & Plan Note (Signed)
-   On Synthroid 200 mcg daily  - Check TSH.

## 2022-11-08 ENCOUNTER — Encounter: Payer: Self-pay | Admitting: Family Medicine

## 2022-11-12 ENCOUNTER — Other Ambulatory Visit: Payer: Self-pay | Admitting: *Deleted

## 2022-11-12 DIAGNOSIS — E032 Hypothyroidism due to medicaments and other exogenous substances: Secondary | ICD-10-CM

## 2022-11-12 MED ORDER — LEVOTHYROXINE SODIUM 175 MCG PO TABS: 175.0000 ug | ORAL_TABLET | Freq: Every day | ORAL | 3 refills | Status: DC

## 2022-11-12 NOTE — Progress Notes (Signed)
Thyroid numbers are slightly off.  Looks like may be getting slightly too much thyroid.  Recommend we decrease his Synthroid to 175 mcg daily and recheck again in 6 weeks.  Please place future order.  The rest of his labs are all stable.  All his hepatitis test are negative.

## 2022-11-27 ENCOUNTER — Ambulatory Visit (INDEPENDENT_AMBULATORY_CARE_PROVIDER_SITE_OTHER): Payer: Medicare HMO | Admitting: Licensed Clinical Social Worker

## 2022-11-27 DIAGNOSIS — F411 Generalized anxiety disorder: Secondary | ICD-10-CM | POA: Diagnosis not present

## 2022-11-27 DIAGNOSIS — F331 Major depressive disorder, recurrent, moderate: Secondary | ICD-10-CM | POA: Diagnosis not present
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
## 2022-12-03 ENCOUNTER — Other Ambulatory Visit (HOSPITAL_COMMUNITY): Payer: Self-pay | Admitting: Cardiovascular Disease

## 2022-12-04 NOTE — Telephone Encounter (Signed)
Please review for refill. Thank you! 

## 2022-12-04 NOTE — Telephone Encounter (Signed)
Prescription refill request for Eliquis received. Indication:afib Last office visit:6/24 Scr:0.98  8/24 Age: 72 Weight:97.6  kg  Prescription refilled

## 2022-12-16 ENCOUNTER — Other Ambulatory Visit: Payer: Self-pay | Admitting: Gastroenterology

## 2022-12-16 ENCOUNTER — Other Ambulatory Visit: Payer: Self-pay | Admitting: Family Medicine

## 2022-12-18 ENCOUNTER — Ambulatory Visit: Payer: Medicare HMO | Admitting: Licensed Clinical Social Worker

## 2022-12-18 DIAGNOSIS — F331 Major depressive disorder, recurrent, moderate: Secondary | ICD-10-CM | POA: Diagnosis not present

## 2022-12-18 NOTE — Progress Notes (Signed)
[facility] Counselor/Therapist Progress Note  Patient ID: Victor Huber, MRN: [id number]    Date: 12/18/22  Time Spent: 0205  pm - 0300 pm : 55 Minutes  Treatment Type: Individual Therapy.  Reported Symptoms: Symptoms of depression  Mental Status Exam: Appearance:  Casual     Behavior: Blaming  Motor: Normal  Speech/Language:  Clear and Coherent  Affect: Flat  Mood: depressed  Thought process: normal  Thought content:   WNL  Sensory/Perceptual disturbances:   WNL  Orientation: oriented to person, place, time/date, situation, day of week, month of year, and year  Attention: Good  Concentration: Good  Memory: WNL  Fund of knowledge:  Good  Insight:   Good  Judgment:  Good  Impulse Control: Good   Risk Assessment: Danger to Self:  No Self-injurious Behavior: No Danger to Others: No Duty to Warn:no Physical Aggression / Violence:No  Access to Firearms a concern: No  Gang Involvement:No   Subjective:   [name] participated from office located at [facility] with Clinician present.  [name]  consented to treatment. [name] presented for his session and was engaged in discussion with Clinician. Patient reports that there is nothing new with him and reports that his wife is his problem. Patient was clearly agitated at his spouse and reports that she has mental issues. He stated that they argue and she will yell at him once she is angry. He reports that she is the reason they moved to [place] and he has never been happy here.  Clinician encouraged patient to try to focus on the good things in his life and attempt to communicate with his spouse in a healthy manner utilizing "I" statements and a calm tone. Patient stated he would think about it and consider it but isn't sure he wants to at this time.  Interventions: Cognitive Behavioral Therapy  Diagnosis: Major Depressive Disorder, recurrent, moderate.   Plan: [name]  is to use  CBT, mindfulness and coping skills to help manage decrease symptoms associated with their diagnosis.   Long-term goal:   [name] will reduce overall level, frequency, and intensity of the feelings of depression evidenced by decreased irritability, negative self talk, and helpless feelings from 6 to 7 days/week to 0 to 2 days/week per client report for at least 3 consecutive months. Treatment plan to be reviewed by 11/27/2023.  Short-term goal:  [name] will verbally express understanding of the relationship between feelings of depression and their impact on thinking patterns and behaviors. Verbalize an understanding of the role that distorted thinking plays in creating fears, excessive worry, and ruminations.  [name] MSW, LCSW DATE: 12/18/2022

## 2022-12-19 ENCOUNTER — Other Ambulatory Visit: Payer: Self-pay | Admitting: *Deleted

## 2022-12-19 MED ORDER — BUPROPION HCL ER (XL) 150 MG PO TB24: 150.0000 mg | ORAL_TABLET | Freq: Every day | ORAL | 3 refills | Status: DC

## 2022-12-19 MED ORDER — DIAZEPAM 5 MG PO TABS: 5.0000 mg | ORAL_TABLET | Freq: Two times a day (BID) | ORAL | 1 refills | Status: DC | PRN

## 2022-12-19 NOTE — Telephone Encounter (Signed)
[facility] pharmacy requesting refills

## 2022-12-20 ENCOUNTER — Ambulatory Visit (INDEPENDENT_AMBULATORY_CARE_PROVIDER_SITE_OTHER): Payer: Medicare HMO | Admitting: Physician Assistant

## 2022-12-20 VITALS — BP 110/70 | HR 70 | Temp 97.3°F | Wt 215.8 lb

## 2022-12-20 DIAGNOSIS — K5909 Other constipation: Secondary | ICD-10-CM

## 2022-12-20 DIAGNOSIS — R339 Retention of urine, unspecified: Secondary | ICD-10-CM

## 2022-12-20 LAB — POCT URINALYSIS DIPSTICK
Bilirubin, UA: NEGATIVE
Blood, UA: NEGATIVE
Glucose, UA: POSITIVE — AB
Ketones, UA: NEGATIVE
Leukocytes, UA: NEGATIVE
Nitrite, UA: NEGATIVE
Protein, UA: NEGATIVE
Spec Grav, UA: 1.015 (ref 1.010–1.025)
Urobilinogen, UA: 0.2 E.U./dL
pH, UA: 6 (ref 5.0–8.0)

## 2022-12-20 NOTE — Patient Instructions (Addendum)
Your urine shows no signs of infection. Your urine retention may be caused by the constipation.  Please start on Miralax one to two times daily to help get cleaned out over the next week, then daily consistently to stay regular if needed. Drink plenty of water (80 oz at least).  You may also double your Flomax the next few days to help with symptoms.  Referral to urology placed if you need follow-up with them.  ER if any pain with urination, blood in urine, severe abdominal pain, or confusion.  Call next week with update please!

## 2022-12-20 NOTE — Progress Notes (Signed)
Subjective:    Patient ID: Victor Huber, male    DOB: 1950-08-10, 72 y.o.   MRN: [id number]  Chief Complaint  Patient presents with   Urinary Retention    He has been having issues urinating for about a week along with constipation. He denies burning. He started an anti depressant at his last visit and is wondering if its causing balance and tremors issues.    HPI Patient is in today for urinary retention concerns. Waking up in the morning last few days - hasn't been able to urinate right away, takes about 2-3 hours. Normal urine the rest of the day. No nocturia, maybe once per night occasionally.  No leakage. No dysuria.   Constipated x 2 weeks. Has been straining. Does go at least once per day, last went yesterday. Wanting to start on Miralax today.   Takes Flomax 0.4 mg every night.   No stomach pain, just feels full quicker.    Past Medical History:  Diagnosis Date   Adenocarcinoma of left lung, stage 3 (HCC) 08/15/2016   Allergy    Anxiety    Arthritis    Atrial fibrillation (HCC) 08/15/2016   Atrial fibrillation (HCC)    Cancer, metastatic to bone [facility])    lung   Cataract    Waiting to schedule bilateral cataract surgery   CHF (congestive heart failure) (HCC) 01/04/2022   COPD (chronic obstructive pulmonary disease) (HCC) 08/15/2016   Depression    Diabetes mellitus, type 2 (HCC)    Dysphagia    Dysrhythmia    a-fib   GERD (gastroesophageal reflux disease)    Heart murmur    atenlol for A Fib/Eliquis   History of chemotherapy    History of radiation therapy    Hyperlipidemia    Hypertension    Hypothyroid 08/15/2016   Longstanding persistent atrial fibrillation (HCC) 08/29/2016   Pathologic fracture    left femur   Pneumonitis    S/P TURP 08/15/2016   T2DM (type 2 diabetes mellitus) (HCC) 05/03/2022   Wears glasses    Wears hearing aid in both ears     Past Surgical History:  Procedure Laterality Date   BIOPSY  12/21/2019   Procedure: BIOPSY;   Surgeon: Lemar Lofty., MD;  Location: [facility] ENDOSCOPY;  Service: Gastroenterology;;   BIOPSY  05/23/2020   Procedure: BIOPSY;  Surgeon: Lemar Lofty., MD;  Location: [facility] ENDOSCOPY;  Service: Gastroenterology;;   BRONCHOSCOPY  10/2014   CARDIAC CATHETERIZATION     05/07/12   CARDIOVERSION     x2   COLONOSCOPY     several yrs   DG BIOPSY LUNG Left 10/2014   FNA - Adenocarcinoma    ESOPHAGOGASTRODUODENOSCOPY (EGD) WITH PROPOFOL N/A 12/21/2019   Procedure: ESOPHAGOGASTRODUODENOSCOPY (EGD) WITH PROPOFOL;  Surgeon: Lemar Lofty., MD;  Location: [facility] ENDOSCOPY;  Service: Gastroenterology;  Laterality: N/A;   ESOPHAGOGASTRODUODENOSCOPY (EGD) WITH PROPOFOL N/A 05/23/2020   Procedure: ESOPHAGOGASTRODUODENOSCOPY (EGD) WITH PROPOFOL;  Surgeon: Meridee Score [name], MD;  Location: [facility] ENDOSCOPY;  Service: Gastroenterology;  Laterality: N/A;   ESOPHAGOGASTRODUODENOSCOPY (EGD) WITH PROPOFOL N/A 06/30/2020   Procedure: ESOPHAGOGASTRODUODENOSCOPY (EGD) WITH PROPOFOL;  Surgeon: Meridee Score [name], MD;  Location: [facility] ENDOSCOPY;  Service: Gastroenterology;  Laterality: N/A;  ultra slim scope avail   EUS N/A 12/21/2019   Procedure: UPPER ENDOSCOPIC ULTRASOUND (EUS) RADIAL;  Surgeon: Lemar Lofty., MD;  Location: [facility] ENDOSCOPY;  Service: Gastroenterology;  Laterality: N/A;   FEMUR IM NAIL Left 02/19/2017   FEMUR IM NAIL  Left 02/19/2017   Procedure: INTRAMEDULLARY (IM) NAIL FEMORAL;  Surgeon: Teryl Lucy, MD;  Location: [facility] OR;  Service: Orthopedics;  Laterality: Left;   IR FLUORO GUIDE PORT INSERTION RIGHT  07/03/2017   IR US GUIDE VASC ACCESS RIGHT  07/03/2017   LUNG CANCER SURGERY Left 12/2014   Wedge Resection    MULTIPLE TOOTH EXTRACTIONS     SAVORY DILATION N/A 05/23/2020   Procedure: SAVORY DILATION;  Surgeon: Lemar Lofty., MD;  Location: [facility] ENDOSCOPY;  Service: Gastroenterology;  Laterality: N/A;   SAVORY DILATION N/A 06/30/2020   Procedure: SAVORY  DILATION;  Surgeon: Meridee Score [name], MD;  Location: [facility] ENDOSCOPY;  Service: Gastroenterology;  Laterality: N/A;   Status post TURP     TONSILLECTOMY     UPPER GASTROINTESTINAL ENDOSCOPY      Family History  Problem Relation Age of Onset   Breast cancer Mother    Breast cancer Maternal Aunt    Breast cancer Maternal Aunt    Lung disease Neg Hx    Colon cancer Neg Hx    Stomach cancer Neg Hx    Pancreatic cancer Neg Hx    Rectal cancer Neg Hx    Colon polyps Neg Hx    Esophageal cancer Neg Hx     Social History   Tobacco Use   Smoking status: Former    Current packs/day: 0.00    Average packs/day: 1 pack/day for 50.0 years (50.0 ttl pk-yrs)    Types: Cigarettes    Start date: 08/16/1962    Quit date: 08/15/2012    Years since quitting: 10.3   Smokeless tobacco: Never  Vaping Use   Vaping status: Never Used  Substance Use Topics   Alcohol use: No   Drug use: No     Allergies  Allergen Reactions   Carboplatin Itching, Nausea And Vomiting and Other (See Comments)    Flushing   Flecainide Hypertension    CAUSED HEART ISSUES    Debrox [Carbamide Peroxide]     Swelling in ear canal.     Review of Systems NEGATIVE UNLESS OTHERWISE INDICATED IN HPI      Objective:     BP 110/70   Pulse 70   Temp (!) 97.3 F (36.3 C) (Temporal)   Wt 215 lb 12.8 oz (97.9 kg)   SpO2 98%   BMI 29.27 kg/m   Wt Readings from Last 3 Encounters:  12/20/22 215 lb 12.8 oz (97.9 kg)  11/06/22 215 lb 3.2 oz (97.6 kg)  11/01/22 216 lb (98 kg)    BP Readings from Last 3 Encounters:  12/20/22 110/70  11/06/22 95/68  11/01/22 116/72     Physical Exam Vitals and nursing note reviewed.  Constitutional:      Appearance: Normal appearance.  Cardiovascular:     Rate and Rhythm: Normal rate and regular rhythm.  Pulmonary:     Effort: Pulmonary effort is normal.     Breath sounds: Normal breath sounds.  Abdominal:     General: Abdomen is flat. Bowel sounds are normal.      Palpations: Abdomen is soft. There is no mass.     Tenderness: There is abdominal tenderness (dull suprapubic pain). There is no right CVA tenderness or left CVA tenderness.  Neurological:     General: No focal deficit present.     Mental Status: He is alert.  Psychiatric:        Mood and Affect: Mood normal.        Assessment &  Plan:  Urinary retention -     POCT urinalysis dipstick -     Ambulatory referral to Urology  Other constipation   72 yo male with hx of TURP, lung cancer, with urinary retention only in mornings the last few days.  UA showing glucose (pt currently on Synjardy), no other abnormal findings on urine, he was able to provide about half-specimen cup full of urine today. No acute distress on exam. Not an acute abdomen.  Discussed plan of care with my supervising physician. Likely 2/2 constipation - will treat with Miralax and increasing fluids. May increase flomax to 0.8 mg daily for short time as well, watch for hypotension.  Urology recommended.  Last CT 08/16/22 reviewed - no bladder masses noted at that time; still need to consider further imaging if symptoms persistent.  ER if any red flags as discussed (in AVS as well).     Vollie Aaron M Elen Acero, PA-C

## 2022-12-27 ENCOUNTER — Ambulatory Visit (HOSPITAL_BASED_OUTPATIENT_CLINIC_OR_DEPARTMENT_OTHER): Payer: Medicare HMO | Admitting: Family

## 2022-12-27 ENCOUNTER — Encounter (HOSPITAL_BASED_OUTPATIENT_CLINIC_OR_DEPARTMENT_OTHER): Payer: Self-pay | Admitting: Family

## 2022-12-27 VITALS — BP 106/72 | HR 92 | Ht 73.0 in | Wt 215.8 lb

## 2022-12-27 DIAGNOSIS — I482 Chronic atrial fibrillation, unspecified: Secondary | ICD-10-CM | POA: Diagnosis not present

## 2022-12-27 DIAGNOSIS — D6859 Other primary thrombophilia: Secondary | ICD-10-CM | POA: Diagnosis not present

## 2022-12-27 DIAGNOSIS — I5022 Chronic systolic (congestive) heart failure: Secondary | ICD-10-CM | POA: Diagnosis not present

## 2022-12-27 DIAGNOSIS — J441 Chronic obstructive pulmonary disease with (acute) exacerbation: Secondary | ICD-10-CM

## 2022-12-27 NOTE — Patient Instructions (Signed)
Medication Instructions:  Your physician recommends that you continue on your current medications as directed. Please refer to the Current Medication list given to you today.  *If you need a refill on your cardiac medications before your next appointment, please call your pharmacy*   Lab Work: Your physician recommends that you return for lab work in the next two weeks- digoxin- get labs in am and then take morning morning dose    Follow-Up: At [facility], you and your health needs are our priority.  As part of our continuing mission to provide you with exceptional heart care, we have created designated Provider Care Teams.  These Care Teams include your primary Cardiologist (physician) and Advanced Practice Providers (APPs -  Physician Assistants and Nurse Practitioners) who all work together to provide you with the care you need, when you need it.  We recommend signing up for the patient portal called "MyChart".  Sign up information is provided on this After Visit Summary.  MyChart is used to connect with patients for Virtual Visits (Telemedicine).  Patients are able to view lab/test results, encounter notes, upcoming appointments, etc.  Non-urgent messages can be sent to your provider as well.   To learn more about what you can do with MyChart, go to [email or web address].    Your next appointment:   6 months with Dr. Duke Salvia

## 2022-12-27 NOTE — Progress Notes (Signed)
Cardiology Office Note:  .   Date:  12/27/2022  ID:  Victor Huber, DOB [birth year], MRN [id number] PCP: [name], MD  [facility] Providers Cardiologist:  Chilton Si, MD    History of Present Illness: Victor Huber Kitchen   Victor Huber is a 72 y.o. male with history of persistent atrial fibrillation, diabetes, COPD, HFmrEF, hypothyroidism, metastatic non-small cell lung cancer s/p resection on chemotherapy, prior tobacco use.    Previously lived in [place].  Diagnosed with atrial fibrillation in 2012.  05/2012 LHC at [facility] with no coronary atherosclerosis. Previously on amiodarone but discontinued when diagnosed with lung cancer in 2016.  Switched to sotalol but failed subsequent DCCV.  Tried flecainide but developed a WCT and subsequent cardiac arrest.  Metoprolol switched to atenolol due to fatigue.  He was referred to atrial fibrillation clinic and not felt to be a good candidate for ablation and declined antiarrhythmics due to cost.Nuclear stress test 12/2019 multiple artifacts no ischemia.   Seen 11/20/21 by Dr. Duke Salvia noting dyspnea, HR not at goal - Atenolol switched to Metoprolol.  Echocardiogram 12/07/2021 with LVEF 40-45%, LV global hypokinesis, mild LVH, RV SF mildly reduced. (Prior 2017 EF 45-50%). Later switched to Biosprolol and diuresed with Lasix for volume overload. At visit 01/2022 started on Entresto.   At visit 05/2022 due to dyspnea, fatigue, and palpitations Digoxin 0.125mg  daily was initiated.  05/22/2022 digoxin level 0.4.  Last seen 09/25/2022 with dyspnea improved but still present with exercise.  He was increasing physical activity.  He was recommended to continue his current regimen.  Today for follow-up. Feeling overall well since last seen. No shortness of breath at rest, dyspnea on exertion stable at baseline. Rare lightheadedness if he changes positions quickly. Has made dietary changes focusing more on fruits/veggies regarding his  diabetes. Still eating an ice cream cone most evenings. Exercisign by riding stationary bicycle. Reports no chest pain, pressure, or tightness. No edema, orthopnea, PND. Reports no palpitations.    ROS: Please see the history of present illness.    All other systems reviewed and are negative.   Studies Reviewed: .        Cardiac Studies & Procedures     STRESS TESTS  MYOCARDIAL PERFUSION IMAGING 03/30/2020  Narrative  Defect 1: There is a small defect of mild severity present in the basal inferolateral and mid inferolateral location that improves with stress suggestive of artifact.  Defect 2: There is a small defect of mild severity present in the apical inferior and apex location in rest and stress- infarct is on differential.  Defect 3: There is a small defect of mild severity present in the basal inferoseptal and basal inferior location in rest and stress- infarct is on differential.  This is an intermediate risk study given multiple small defects.  Findings consistent with prior myocardial infarction or sub diaphragmatic attenuation (unable to assess gating of walls in the setting of atrial fibrillation).  Possible inferior infarct.  Intermediate risk study.   ECHOCARDIOGRAM  ECHOCARDIOGRAM COMPLETE 12/07/2021  Narrative ECHOCARDIOGRAM REPORT    Patient Name:   Victor Huber Date of Exam: 12/07/2021 Medical Rec #:  [id number]         Height:       73.0 in Accession #:    [id number]        Weight:       234.7 lb Date of Birth:  02/13/1951         BSA:  2.303 m Patient Age:    71 years          BP:           120/60 mmHg Patient Gender: M                 HR:           103 bpm. Exam Location:  Outpatient  Procedure: 2D Echo, 3D Echo, Color Doppler, Cardiac Doppler and Strain Analysis  Indications:    Persistent atrial fibrillation  History:        Patient has prior history of Echocardiogram examinations, most recent 01/02/2016. COPD, Arrythmias:Atrial  Fibrillation; Risk Factors:Former Smoker and Dyslipidemia. Metastatic non-small cell lung cancer s/p resection.  Sonographer:    Jeryl Columbia RDCS Referring Phys: [id number] TIFFANY Ford  IMPRESSIONS   1. Techinically difficult study. Left ventricular ejection fraction, by estimation, is 40 to 45%. The left ventricle has mildly decreased function. The left ventricle demonstrates global hypokinesis. There is mild left ventricular hypertrophy. Left ventricular diastolic parameters are indeterminate. 2. Right ventricular systolic function is mildly reduced. The right ventricular size is moderately enlarged. Tricuspid regurgitation signal is inadequate for assessing PA pressure. 3. The mitral valve is normal in structure. Trivial mitral valve regurgitation. No evidence of mitral stenosis. 4. The aortic valve was not well visualized. Aortic valve regurgitation is not visualized. Aortic valve sclerosis/calcification is present, without any evidence of aortic stenosis. 5. The inferior vena cava is normal in size with greater than 50% respiratory variability, suggesting right atrial pressure of 3 mmHg.  FINDINGS Left Ventricle: Left ventricular ejection fraction, by estimation, is 40 to 45%. The left ventricle has mildly decreased function. The left ventricle demonstrates global hypokinesis. The left ventricular internal cavity size was normal in size. There is mild left ventricular hypertrophy. Left ventricular diastolic parameters are indeterminate.  Right Ventricle: The right ventricular size is moderately enlarged. Right vetricular wall thickness was not well visualized. Right ventricular systolic function is mildly reduced. Tricuspid regurgitation signal is inadequate for assessing PA pressure.  Left Atrium: Left atrial size was normal in size.  Right Atrium: Right atrial size was normal in size.  Pericardium: Trivial pericardial effusion is present.  Mitral Valve: The mitral valve is  normal in structure. Trivial mitral valve regurgitation. No evidence of mitral valve stenosis.  Tricuspid Valve: The tricuspid valve is normal in structure. Tricuspid valve regurgitation is trivial.  Aortic Valve: The aortic valve was not well visualized. Aortic valve regurgitation is not visualized. Aortic valve sclerosis/calcification is present, without any evidence of aortic stenosis.  Pulmonic Valve: The pulmonic valve was not well visualized. Pulmonic valve regurgitation is not visualized.  Aorta: The aortic root and ascending aorta are structurally normal, with no evidence of dilitation.  Venous: The inferior vena cava is normal in size with greater than 50% respiratory variability, suggesting right atrial pressure of 3 mmHg.  IAS/Shunts: The interatrial septum was not well visualized.   LEFT VENTRICLE PLAX 2D LVIDd:         5.57 cm   Diastology LVIDs:         4.39 cm   LV e' medial:    8.92 cm/s LV PW:         1.22 cm   LV E/e' medial:  9.2 LV IVS:        1.02 cm   LV e' lateral:   8.38 cm/s LVOT diam:     2.20 cm   LV E/e' lateral: 9.8 LV SV:  38 LV SV Index:   16 LVOT Area:     3.80 cm  3D Volume EF: 3D EF:        43 % LV EDV:       143 ml LV ESV:       82 ml LV SV:        61 ml  RIGHT VENTRICLE RV Basal diam:  5.19 cm RV Mid diam:    4.01 cm RV S prime:     10.70 cm/s TAPSE (M-mode): 2.3 cm  LEFT ATRIUM             Index        RIGHT ATRIUM           Index LA diam:        5.50 cm 2.39 cm/m   RA Area:     16.20 cm LA Vol (A2C):   78.2 ml 33.96 ml/m  RA Volume:   42.00 ml  18.24 ml/m LA Vol (A4C):   54.5 ml 23.66 ml/m LA Biplane Vol: 66.6 ml 28.92 ml/m AORTIC VALVE LVOT Vmax:   64.00 cm/s LVOT Vmean:  42.700 cm/s LVOT VTI:    0.100 m  AORTA Ao Root diam: 3.40 cm Ao Asc diam:  3.60 cm  MITRAL VALVE MV Area (PHT): 3.61 cm    SHUNTS MV Decel Time: 210 msec    Systemic VTI:  0.10 m MV E velocity: 81.80 cm/s  Systemic Diam: 2.20  cm  Epifanio Lesches MD Electronically signed by Epifanio Lesches MD Signature Date/Time: 12/08/2021/12:41:00 PM    Final             Risk Assessment/Calculations:    CHA2DS2-VASc Score = 4   This indicates a 4.8% annual risk of stroke. The patient's score is based upon: CHF History: 1 HTN History: 0 Diabetes History: 1 Stroke History: 0 Vascular Disease History: 1 Age Score: 1 Gender Score: 0            Physical Exam:   VS:  BP 106/72   Pulse 92   Ht 6\' 1"  (1.854 m)   Wt 215 lb 12.8 oz (97.9 kg)   SpO2 96%   BMI 28.47 kg/m    Wt Readings from Last 3 Encounters:  12/27/22 215 lb 12.8 oz (97.9 kg)  12/20/22 215 lb 12.8 oz (97.9 kg)  11/06/22 215 lb 3.2 oz (97.6 kg)    GEN: Well nourished, well developed in no acute distress NECK: No JVD; No carotid bruits CARDIAC: IRIR, no murmurs, rubs, gallops RESPIRATORY:  Clear to auscultation without rales, wheezing or rhonchi  ABDOMEN: Soft, non-tender, non-distended EXTREMITIES:  No edema; No deformity   ASSESSMENT AND PLAN: .    Chronic atrial fibrillation/hypercoagulable state/High risk medication use - Rate controlled today by auscultations. CHA2DS2-VASc Score = 4 [CHF History: 1, HTN History: 0, Diabetes History: 1, Stroke History: 0, Vascular Disease History: 1, Age Score: 1, Gender Score: 0].  Therefore, the patient's annual risk of stroke is 4.8 %.   Denies bleeding complications 11/2022 normal renal function and Hb. Continue Digoxin 0.125mg  daily, Eliquis 5mg  BID, Bisoprolol 5mg  daily. 05/2022 digoxin 0.4. Update digoxin level.  HFmrEF -12/2021 LVEF 40 to 45%, RWMA. Euvolemic and well compensated on exam. GDMT bisoprolol, digoxin, empagliflozin (as part of Synjardy). No indication for loop diuretic.   DM2 - Continue to follow with PCP.   COPD /metastatic lung adenocarcinoma -follows with pulmonology and oncology.       Dispo: follow up in  6 months with Dr. Duke Salvia  Signed, Alver Sorrow, NP

## 2023-01-03 ENCOUNTER — Ambulatory Visit: Payer: Medicare HMO | Admitting: Licensed Clinical Social Worker

## 2023-01-03 ENCOUNTER — Other Ambulatory Visit: Payer: Self-pay

## 2023-01-03 DIAGNOSIS — F331 Major depressive disorder, recurrent, moderate: Secondary | ICD-10-CM

## 2023-01-03 MED ORDER — PANCRELIPASE (LIP-PROT-AMYL) 36000-114000 UNITS PO CPEP: ORAL_CAPSULE | ORAL | 3 refills | Status: AC

## 2023-01-03 NOTE — Progress Notes (Signed)
[facility] Counselor/Therapist Progress Note  Patient ID: Victor Huber, MRN: [id number]    Date: 01/03/23  Time Spent: 105  pm - 215 pm : 70 Minutes  Treatment Type: Individual Therapy.  Reported Symptoms: Symptoms of depression, loss of interest, irritability  Mental Status Exam: Appearance:  Neat     Behavior: Agitated  Motor: Shuffling Gait  Speech/Language:  Clear and Coherent  Affect: Flat  Mood: irritable  Thought process: normal  Thought content:   WNL  Sensory/Perceptual disturbances:   WNL  Orientation: oriented to person, place, time/date, situation, day of week, month of year, and year  Attention: Good  Concentration: Fair  Memory: Fair  Fund of knowledge:  Good  Insight:   Fair  Judgment:  Fair  Impulse Control: Good   Risk Assessment: Danger to Self:  No Self-injurious Behavior: No Danger to Others: No Duty to Warn:no Physical Aggression / Violence:No  Access to Firearms a concern: No  Gang Involvement:No   Subjective:   [name] participated from office located at [facility] with Clinician present. Cordaro consented to treatment.   Patient presented for his session in an irritable mood. Patient discussed that he and his  wife are getting along better and he has been doing his own thing and she does hers. He stated that being apart is better for them. Patient was insightful in identifying that both of them have too much time on their hands. Clinician processed with patient that there several activities and organizations in the area that they could become involved in. Patient declined and stated that he doesn't want to be around people. Patient reported that he wants to feel better and feels that he needs medication and that he needs to see a psychiatrist versus a therapist. Clinician assisted in finding a provider through [facility] at patient request. Patient was given an appointment via Zoom in November. Patient was reminded  that he was on medication already but he stated it wasn't helping.   Interventions: Cognitive Behavioral Therapy and Motivational Interviewing  Diagnosis: Major Depressive Disorder recurrent moderate   Plan: Park is to use CBT, mindfulness and coping skills to help manage decrease symptoms associated with their diagnosis.   Long-term goal:   Abriel will reduce overall level, frequency, and intensity of the feelings of depression, anxiety and panic evidenced by decreased irritability, negative self talk, and helpless feelings from 6 to 7 days/week to 0 to 2 days/week per client report for at least 3 consecutive months. Treatment plan to be reviewed by 11/27/2023.  Short-term goal:  Jamaree will verbally express understanding of the relationship between feelings of depression, anxiety and their impact on thinking patterns and behaviors. Verbalize an understanding of the role that distorted thinking plays in creating fears, excessive worry, and ruminations.  [name] MSW, LCSW DATE: 01/03/2023

## 2023-01-04 DIAGNOSIS — R338 Other retention of urine: Secondary | ICD-10-CM | POA: Diagnosis not present

## 2023-01-08 DIAGNOSIS — R338 Other retention of urine: Secondary | ICD-10-CM | POA: Diagnosis not present

## 2023-01-09 ENCOUNTER — Inpatient Hospital Stay (HOSPITAL_COMMUNITY): Payer: Medicare HMO

## 2023-01-09 ENCOUNTER — Emergency Department (HOSPITAL_COMMUNITY): Payer: Medicare HMO

## 2023-01-09 ENCOUNTER — Other Ambulatory Visit: Payer: Self-pay

## 2023-01-09 ENCOUNTER — Inpatient Hospital Stay (HOSPITAL_COMMUNITY)
Admission: EM | Admit: 2023-01-09 | Discharge: 2023-01-13 | DRG: 698 | Disposition: A | Discharge status: Home / Self Care | Payer: Medicare HMO | Attending: Internal Medicine | Admitting: Internal Medicine

## 2023-01-09 DIAGNOSIS — E1169 Type 2 diabetes mellitus with other specified complication: Secondary | ICD-10-CM | POA: Diagnosis not present

## 2023-01-09 DIAGNOSIS — R0602 Shortness of breath: Secondary | ICD-10-CM | POA: Diagnosis not present

## 2023-01-09 DIAGNOSIS — R069 Unspecified abnormalities of breathing: Secondary | ICD-10-CM | POA: Diagnosis not present

## 2023-01-09 DIAGNOSIS — F321 Major depressive disorder, single episode, moderate: Secondary | ICD-10-CM | POA: Diagnosis not present

## 2023-01-09 DIAGNOSIS — K5900 Constipation, unspecified: Secondary | ICD-10-CM | POA: Diagnosis not present

## 2023-01-09 DIAGNOSIS — Z1152 Encounter for screening for COVID-19: Secondary | ICD-10-CM

## 2023-01-09 DIAGNOSIS — I482 Chronic atrial fibrillation, unspecified: Secondary | ICD-10-CM | POA: Diagnosis not present

## 2023-01-09 DIAGNOSIS — A4159 Other Gram-negative sepsis: Secondary | ICD-10-CM | POA: Diagnosis present

## 2023-01-09 DIAGNOSIS — J449 Chronic obstructive pulmonary disease, unspecified: Secondary | ICD-10-CM | POA: Diagnosis present

## 2023-01-09 DIAGNOSIS — I5042 Chronic combined systolic (congestive) and diastolic (congestive) heart failure: Secondary | ICD-10-CM | POA: Diagnosis not present

## 2023-01-09 DIAGNOSIS — J9601 Acute respiratory failure with hypoxia: Secondary | ICD-10-CM | POA: Diagnosis not present

## 2023-01-09 DIAGNOSIS — I4821 Permanent atrial fibrillation: Secondary | ICD-10-CM | POA: Diagnosis present

## 2023-01-09 DIAGNOSIS — B961 Klebsiella pneumoniae [K. pneumoniae] as the cause of diseases classified elsewhere: Secondary | ICD-10-CM | POA: Diagnosis not present

## 2023-01-09 DIAGNOSIS — B952 Enterococcus as the cause of diseases classified elsewhere: Secondary | ICD-10-CM | POA: Diagnosis present

## 2023-01-09 DIAGNOSIS — F419 Anxiety disorder, unspecified: Secondary | ICD-10-CM | POA: Diagnosis present

## 2023-01-09 DIAGNOSIS — Z7984 Long term (current) use of oral hypoglycemic drugs: Secondary | ICD-10-CM

## 2023-01-09 DIAGNOSIS — R591 Generalized enlarged lymph nodes: Secondary | ICD-10-CM | POA: Diagnosis present

## 2023-01-09 DIAGNOSIS — K8689 Other specified diseases of pancreas: Secondary | ICD-10-CM | POA: Diagnosis not present

## 2023-01-09 DIAGNOSIS — N401 Enlarged prostate with lower urinary tract symptoms: Secondary | ICD-10-CM | POA: Diagnosis not present

## 2023-01-09 DIAGNOSIS — R1310 Dysphagia, unspecified: Secondary | ICD-10-CM | POA: Diagnosis present

## 2023-01-09 DIAGNOSIS — E119 Type 2 diabetes mellitus without complications: Secondary | ICD-10-CM | POA: Diagnosis present

## 2023-01-09 DIAGNOSIS — C3492 Malignant neoplasm of unspecified part of left bronchus or lung: Secondary | ICD-10-CM | POA: Diagnosis present

## 2023-01-09 DIAGNOSIS — R0902 Hypoxemia: Secondary | ICD-10-CM | POA: Diagnosis present

## 2023-01-09 DIAGNOSIS — I4891 Unspecified atrial fibrillation: Secondary | ICD-10-CM | POA: Diagnosis not present

## 2023-01-09 DIAGNOSIS — R6520 Severe sepsis without septic shock: Secondary | ICD-10-CM | POA: Diagnosis present

## 2023-01-09 DIAGNOSIS — Z888 Allergy status to other drugs, medicaments and biological substances status: Secondary | ICD-10-CM

## 2023-01-09 DIAGNOSIS — E860 Dehydration: Secondary | ICD-10-CM | POA: Diagnosis present

## 2023-01-09 DIAGNOSIS — I700 Atherosclerosis of aorta: Secondary | ICD-10-CM | POA: Diagnosis not present

## 2023-01-09 DIAGNOSIS — J441 Chronic obstructive pulmonary disease with (acute) exacerbation: Secondary | ICD-10-CM | POA: Diagnosis not present

## 2023-01-09 DIAGNOSIS — I2510 Atherosclerotic heart disease of native coronary artery without angina pectoris: Secondary | ICD-10-CM | POA: Diagnosis not present

## 2023-01-09 DIAGNOSIS — Z9221 Personal history of antineoplastic chemotherapy: Secondary | ICD-10-CM

## 2023-01-09 DIAGNOSIS — I499 Cardiac arrhythmia, unspecified: Secondary | ICD-10-CM | POA: Diagnosis not present

## 2023-01-09 DIAGNOSIS — K219 Gastro-esophageal reflux disease without esophagitis: Secondary | ICD-10-CM | POA: Diagnosis present

## 2023-01-09 DIAGNOSIS — N3001 Acute cystitis with hematuria: Secondary | ICD-10-CM | POA: Diagnosis present

## 2023-01-09 DIAGNOSIS — T83518A Infection and inflammatory reaction due to other urinary catheter, initial encounter: Secondary | ICD-10-CM | POA: Diagnosis not present

## 2023-01-09 DIAGNOSIS — A419 Sepsis, unspecified organism: Secondary | ICD-10-CM | POA: Diagnosis present

## 2023-01-09 DIAGNOSIS — K861 Other chronic pancreatitis: Secondary | ICD-10-CM | POA: Diagnosis present

## 2023-01-09 DIAGNOSIS — J439 Emphysema, unspecified: Secondary | ICD-10-CM | POA: Diagnosis not present

## 2023-01-09 DIAGNOSIS — I110 Hypertensive heart disease with heart failure: Secondary | ICD-10-CM | POA: Diagnosis present

## 2023-01-09 DIAGNOSIS — N179 Acute kidney failure, unspecified: Secondary | ICD-10-CM | POA: Diagnosis present

## 2023-01-09 DIAGNOSIS — C7951 Secondary malignant neoplasm of bone: Secondary | ICD-10-CM | POA: Diagnosis present

## 2023-01-09 DIAGNOSIS — N390 Urinary tract infection, site not specified: Secondary | ICD-10-CM | POA: Diagnosis not present

## 2023-01-09 DIAGNOSIS — I959 Hypotension, unspecified: Secondary | ICD-10-CM | POA: Diagnosis present

## 2023-01-09 DIAGNOSIS — R338 Other retention of urine: Secondary | ICD-10-CM | POA: Diagnosis not present

## 2023-01-09 DIAGNOSIS — Z23 Encounter for immunization: Secondary | ICD-10-CM

## 2023-01-09 DIAGNOSIS — Z87891 Personal history of nicotine dependence: Secondary | ICD-10-CM

## 2023-01-09 DIAGNOSIS — R231 Pallor: Secondary | ICD-10-CM | POA: Diagnosis not present

## 2023-01-09 DIAGNOSIS — E785 Hyperlipidemia, unspecified: Secondary | ICD-10-CM | POA: Diagnosis present

## 2023-01-09 DIAGNOSIS — E039 Hypothyroidism, unspecified: Secondary | ICD-10-CM | POA: Diagnosis present

## 2023-01-09 DIAGNOSIS — K8020 Calculus of gallbladder without cholecystitis without obstruction: Secondary | ICD-10-CM | POA: Diagnosis not present

## 2023-01-09 DIAGNOSIS — J432 Centrilobular emphysema: Secondary | ICD-10-CM | POA: Diagnosis not present

## 2023-01-09 DIAGNOSIS — N320 Bladder-neck obstruction: Secondary | ICD-10-CM | POA: Diagnosis not present

## 2023-01-09 DIAGNOSIS — Z9079 Acquired absence of other genital organ(s): Secondary | ICD-10-CM

## 2023-01-09 DIAGNOSIS — Z79899 Other long term (current) drug therapy: Secondary | ICD-10-CM

## 2023-01-09 DIAGNOSIS — N400 Enlarged prostate without lower urinary tract symptoms: Secondary | ICD-10-CM | POA: Diagnosis present

## 2023-01-09 DIAGNOSIS — J9859 Other diseases of mediastinum, not elsewhere classified: Secondary | ICD-10-CM | POA: Diagnosis not present

## 2023-01-09 DIAGNOSIS — Z923 Personal history of irradiation: Secondary | ICD-10-CM

## 2023-01-09 DIAGNOSIS — I509 Heart failure, unspecified: Secondary | ICD-10-CM

## 2023-01-09 DIAGNOSIS — R918 Other nonspecific abnormal finding of lung field: Secondary | ICD-10-CM | POA: Diagnosis not present

## 2023-01-09 DIAGNOSIS — Z79890 Hormone replacement therapy (postmenopausal): Secondary | ICD-10-CM

## 2023-01-09 DIAGNOSIS — Z974 Presence of external hearing-aid: Secondary | ICD-10-CM

## 2023-01-09 DIAGNOSIS — Z7901 Long term (current) use of anticoagulants: Secondary | ICD-10-CM

## 2023-01-09 DIAGNOSIS — Y846 Urinary catheterization as the cause of abnormal reaction of the patient, or of later complication, without mention of misadventure at the time of the procedure: Secondary | ICD-10-CM | POA: Diagnosis present

## 2023-01-09 DIAGNOSIS — R590 Localized enlarged lymph nodes: Secondary | ICD-10-CM | POA: Diagnosis not present

## 2023-01-09 LAB — COMPREHENSIVE METABOLIC PANEL
ALT: 22 U/L (ref 0–44)
AST: 27 U/L (ref 15–41)
Albumin: 4 g/dL (ref 3.5–5.0)
Alkaline Phosphatase: 74 U/L (ref 38–126)
Anion gap: 17 — ABNORMAL HIGH (ref 5–15)
BUN: 18 mg/dL (ref 8–23)
CO2: 18 mmol/L — ABNORMAL LOW (ref 22–32)
Calcium: 9.4 mg/dL (ref 8.9–10.3)
Chloride: 105 mmol/L (ref 98–111)
Creatinine, Ser: 1.39 mg/dL — ABNORMAL HIGH (ref 0.61–1.24)
GFR, Estimated: 53 mL/min — ABNORMAL LOW (ref >60)
Glucose, Bld: 220 mg/dL — ABNORMAL HIGH (ref 70–99)
Potassium: 4.5 mmol/L (ref 3.5–5.1)
Sodium: 140 mmol/L (ref 135–145)
Total Bilirubin: 0.7 mg/dL (ref 0.3–1.2)
Total Protein: 6.7 g/dL (ref 6.5–8.1)

## 2023-01-09 LAB — CBC WITH DIFFERENTIAL/PLATELET
Abs Immature Granulocytes: 0.04 10*3/uL (ref 0.00–0.07)
Basophils Absolute: 0 10*3/uL (ref 0.0–0.1)
Basophils Relative: 0 %
Eosinophils Absolute: 0 10*3/uL (ref 0.0–0.5)
Eosinophils Relative: 0 %
HCT: 47 % (ref 39.0–52.0)
Hemoglobin: 16.1 g/dL (ref 13.0–17.0)
Immature Granulocytes: 0 %
Lymphocytes Relative: 9 %
Lymphs Abs: 1.1 10*3/uL (ref 0.7–4.0)
MCH: 32.2 pg (ref 26.0–34.0)
MCHC: 34.3 g/dL (ref 30.0–36.0)
MCV: 94 fL (ref 80.0–100.0)
Monocytes Absolute: 0.1 10*3/uL (ref 0.1–1.0)
Monocytes Relative: 1 %
Neutro Abs: 11.3 10*3/uL — ABNORMAL HIGH (ref 1.7–7.7)
Neutrophils Relative %: 90 %
Platelets: 135 10*3/uL — ABNORMAL LOW (ref 150–400)
RBC: 5 MIL/uL (ref 4.22–5.81)
RDW: 13.5 % (ref 11.5–15.5)
WBC: 12.6 10*3/uL — ABNORMAL HIGH (ref 4.0–10.5)
nRBC: 0 % (ref 0.0–0.2)

## 2023-01-09 LAB — I-STAT VENOUS BLOOD GAS, ED
Acid-base deficit: 7 mmol/L — ABNORMAL HIGH (ref 0.0–2.0)
Bicarbonate: 19.1 mmol/L — ABNORMAL LOW (ref 20.0–28.0)
Calcium, Ion: 1.17 mmol/L (ref 1.15–1.40)
HCT: 47 % (ref 39.0–52.0)
Hemoglobin: 16 g/dL (ref 13.0–17.0)
O2 Saturation: 92 %
Potassium: 4.4 mmol/L (ref 3.5–5.1)
Sodium: 140 mmol/L (ref 135–145)
TCO2: 20 mmol/L — ABNORMAL LOW (ref 22–32)
pCO2, Ven: 40 mm[Hg] — ABNORMAL LOW (ref 44–60)
pH, Ven: 7.288 (ref 7.25–7.43)
pO2, Ven: 71 mm[Hg] — ABNORMAL HIGH (ref 32–45)

## 2023-01-09 LAB — I-STAT ARTERIAL BLOOD GAS, ED
Acid-base deficit: 4 mmol/L — ABNORMAL HIGH (ref 0.0–2.0)
Bicarbonate: 18.4 mmol/L — ABNORMAL LOW (ref 20.0–28.0)
Calcium, Ion: 1.14 mmol/L — ABNORMAL LOW (ref 1.15–1.40)
HCT: 37 % — ABNORMAL LOW (ref 39.0–52.0)
Hemoglobin: 12.6 g/dL — ABNORMAL LOW (ref 13.0–17.0)
O2 Saturation: 99 %
Potassium: 4 mmol/L (ref 3.5–5.1)
Sodium: 136 mmol/L (ref 135–145)
TCO2: 19 mmol/L — ABNORMAL LOW (ref 22–32)
pCO2 arterial: 26.4 mm[Hg] — ABNORMAL LOW (ref 32–48)
pH, Arterial: 7.451 — ABNORMAL HIGH (ref 7.35–7.45)
pO2, Arterial: 146 mm[Hg] — ABNORMAL HIGH (ref 83–108)

## 2023-01-09 LAB — MAGNESIUM: Magnesium: 1.6 mg/dL — ABNORMAL LOW (ref 1.7–2.4)

## 2023-01-09 LAB — URINALYSIS, W/ REFLEX TO CULTURE (INFECTION SUSPECTED)
Bilirubin Urine: NEGATIVE
Glucose, UA: >=500 mg/dL — AB
Ketones, ur: 5 mg/dL — AB
Nitrite: POSITIVE — AB
Protein, ur: NEGATIVE mg/dL
Specific Gravity, Urine: 1.021 (ref 1.005–1.030)
pH: 5 (ref 5.0–8.0)

## 2023-01-09 LAB — PROTIME-INR
INR: 1 (ref 0.8–1.2)
Prothrombin Time: 13.6 s (ref 11.4–15.2)

## 2023-01-09 LAB — APTT: aPTT: 25 s (ref 24–36)

## 2023-01-09 LAB — I-STAT CG4 LACTIC ACID, ED
Lactic Acid, Venous: 3.5 mmol/L (ref 0.5–1.9)
Lactic Acid, Venous: 1.8 mmol/L (ref 0.5–1.9)

## 2023-01-09 LAB — LACTIC ACID, PLASMA: Lactic Acid, Venous: 7.7 mmol/L (ref 0.5–1.9)

## 2023-01-09 LAB — BRAIN NATRIURETIC PEPTIDE: B Natriuretic Peptide: 127.2 pg/mL — ABNORMAL HIGH (ref 0.0–100.0)

## 2023-01-09 LAB — SARS CORONAVIRUS 2 BY RT PCR: SARS Coronavirus 2 by RT PCR: NEGATIVE

## 2023-01-09 LAB — CBG MONITORING, ED: Glucose-Capillary: 227 mg/dL — ABNORMAL HIGH (ref 70–99)

## 2023-01-09 MED ORDER — SODIUM CHLORIDE 0.9 % IV SOLN
2.0000 g | Freq: Two times a day (BID) | INTRAVENOUS | Status: DC
  Administered 2023-01-09: 2 g via INTRAVENOUS
  Filled 2023-01-09: qty 12.5

## 2023-01-09 MED ORDER — VANCOMYCIN HCL 2000 MG/400ML IV SOLN
2000.0000 mg | Freq: Once | INTRAVENOUS | Status: AC
  Administered 2023-01-09: 2000 mg via INTRAVENOUS
  Filled 2023-01-09: qty 400

## 2023-01-09 MED ORDER — SODIUM CHLORIDE 0.9% FLUSH
3.0000 mL | Freq: Two times a day (BID) | INTRAVENOUS | Status: DC
  Administered 2023-01-09 – 2023-01-12 (×6): 3 mL via INTRAVENOUS

## 2023-01-09 MED ORDER — LACTATED RINGERS IV BOLUS (SEPSIS)
500.0000 mL | Freq: Once | INTRAVENOUS | Status: AC
  Administered 2023-01-09: 500 mL via INTRAVENOUS

## 2023-01-09 MED ORDER — POLYETHYLENE GLYCOL 3350 17 G PO PACK: 17.0000 g | PACK | Freq: Every day | ORAL | Status: DC | PRN

## 2023-01-09 MED ORDER — LACTATED RINGERS IV SOLN
150.0000 mL/h | INTRAVENOUS | Status: AC
  Administered 2023-01-09 – 2023-01-10 (×3): 150 mL/h via INTRAVENOUS

## 2023-01-09 MED ORDER — ALBUTEROL SULFATE (2.5 MG/3ML) 0.083% IN NEBU: 2.5000 mg | INHALATION_SOLUTION | Freq: Four times a day (QID) | RESPIRATORY_TRACT | Status: DC | PRN

## 2023-01-09 MED ORDER — ACETAMINOPHEN 650 MG RE SUPP: 650.0000 mg | Freq: Four times a day (QID) | RECTAL | Status: DC | PRN

## 2023-01-09 MED ORDER — ARFORMOTEROL TARTRATE 15 MCG/2ML IN NEBU
15.0000 ug | INHALATION_SOLUTION | Freq: Two times a day (BID) | RESPIRATORY_TRACT | Status: DC
  Administered 2023-01-09 – 2023-01-13 (×8): 15 ug via RESPIRATORY_TRACT
  Filled 2023-01-09 (×8): qty 2

## 2023-01-09 MED ORDER — SORBITOL 70 % SOLN: 30.0000 mL | Freq: Every day | Status: DC | PRN

## 2023-01-09 MED ORDER — GABAPENTIN 300 MG PO CAPS
300.0000 mg | ORAL_CAPSULE | Freq: Every day | ORAL | Status: DC
  Administered 2023-01-09 – 2023-01-12 (×4): 300 mg via ORAL
  Filled 2023-01-09 (×4): qty 1

## 2023-01-09 MED ORDER — LEVALBUTEROL HCL 0.63 MG/3ML IN NEBU: 0.6300 mg | INHALATION_SOLUTION | Freq: Four times a day (QID) | RESPIRATORY_TRACT | Status: DC | PRN

## 2023-01-09 MED ORDER — DIGOXIN 125 MCG PO TABS: 0.1250 mg | ORAL_TABLET | Freq: Every day | ORAL | Status: DC

## 2023-01-09 MED ORDER — TAMSULOSIN HCL 0.4 MG PO CAPS
0.4000 mg | ORAL_CAPSULE | Freq: Every day | ORAL | Status: DC
  Administered 2023-01-10 – 2023-01-13 (×4): 0.4 mg via ORAL
  Filled 2023-01-09 (×4): qty 1

## 2023-01-09 MED ORDER — DILTIAZEM HCL 25 MG/5ML IV SOLN
INTRAVENOUS | Status: AC
  Administered 2023-01-09: 15 mg
  Filled 2023-01-09: qty 5

## 2023-01-09 MED ORDER — METRONIDAZOLE 500 MG/100ML IV SOLN
500.0000 mg | Freq: Once | INTRAVENOUS | Status: AC
  Administered 2023-01-09: 500 mg via INTRAVENOUS
  Filled 2023-01-09: qty 100

## 2023-01-09 MED ORDER — BISOPROLOL FUMARATE 5 MG PO TABS: 5.0000 mg | ORAL_TABLET | Freq: Every day | ORAL | Status: DC

## 2023-01-09 MED ORDER — METOPROLOL TARTRATE 5 MG/5ML IV SOLN
2.5000 mg | Freq: Once | INTRAVENOUS | Status: AC
  Administered 2023-01-09: 2.5 mg via INTRAVENOUS
  Filled 2023-01-09: qty 5

## 2023-01-09 MED ORDER — BISOPROLOL FUMARATE 5 MG PO TABS
5.0000 mg | ORAL_TABLET | Freq: Every day | ORAL | Status: DC
  Administered 2023-01-09 – 2023-01-12 (×4): 5 mg via ORAL
  Filled 2023-01-09 (×4): qty 1

## 2023-01-09 MED ORDER — AMIODARONE LOAD VIA INFUSION
150.0000 mg | Freq: Once | INTRAVENOUS | Status: AC
  Administered 2023-01-09: 150 mg via INTRAVENOUS
  Filled 2023-01-09: qty 83.34

## 2023-01-09 MED ORDER — APIXABAN 5 MG PO TABS
5.0000 mg | ORAL_TABLET | Freq: Two times a day (BID) | ORAL | Status: DC
  Administered 2023-01-09 – 2023-01-13 (×6): 5 mg via ORAL
  Filled 2023-01-09 (×7): qty 1

## 2023-01-09 MED ORDER — LACTATED RINGERS IV BOLUS
500.0000 mL | Freq: Once | INTRAVENOUS | Status: AC
  Administered 2023-01-09: 500 mL via INTRAVENOUS

## 2023-01-09 MED ORDER — BUPROPION HCL ER (XL) 150 MG PO TB24
150.0000 mg | ORAL_TABLET | Freq: Every day | ORAL | Status: DC
  Administered 2023-01-09 – 2023-01-13 (×5): 150 mg via ORAL
  Filled 2023-01-09 (×5): qty 1

## 2023-01-09 MED ORDER — SODIUM CHLORIDE 0.9 % IV SOLN
2.0000 g | Freq: Once | INTRAVENOUS | Status: AC
  Administered 2023-01-09: 2 g via INTRAVENOUS
  Filled 2023-01-09: qty 12.5

## 2023-01-09 MED ORDER — DIAZEPAM 5 MG PO TABS: 5.0000 mg | ORAL_TABLET | Freq: Two times a day (BID) | ORAL | Status: DC | PRN

## 2023-01-09 MED ORDER — INSULIN ASPART 100 UNIT/ML IJ SOLN
0.0000 [IU] | Freq: Three times a day (TID) | INTRAMUSCULAR | Status: DC
  Administered 2023-01-09: 3 [IU] via SUBCUTANEOUS
  Administered 2023-01-10 – 2023-01-11 (×4): 2 [IU] via SUBCUTANEOUS
  Administered 2023-01-11 (×2): 5 [IU] via SUBCUTANEOUS
  Administered 2023-01-12 (×2): 2 [IU] via SUBCUTANEOUS
  Administered 2023-01-12 – 2023-01-13 (×2): 1 [IU] via SUBCUTANEOUS

## 2023-01-09 MED ORDER — SORBITOL 70 % SOLN
30.0000 mL | Freq: Once | Status: DC
  Filled 2023-01-09: qty 30

## 2023-01-09 MED ORDER — ATORVASTATIN CALCIUM 40 MG PO TABS
40.0000 mg | ORAL_TABLET | Freq: Every day | ORAL | Status: DC
  Administered 2023-01-10 – 2023-01-13 (×4): 40 mg via ORAL
  Filled 2023-01-09 (×4): qty 1

## 2023-01-09 MED ORDER — UMECLIDINIUM BROMIDE 62.5 MCG/ACT IN AEPB
1.0000 | INHALATION_SPRAY | Freq: Every day | RESPIRATORY_TRACT | Status: DC
  Administered 2023-01-10 – 2023-01-13 (×3): 1 via RESPIRATORY_TRACT
  Filled 2023-01-09 (×3): qty 7

## 2023-01-09 MED ORDER — ACETAMINOPHEN 325 MG PO TABS
650.0000 mg | ORAL_TABLET | Freq: Four times a day (QID) | ORAL | Status: DC | PRN
  Administered 2023-01-09 – 2023-01-10 (×4): 650 mg via ORAL
  Filled 2023-01-09 (×5): qty 2

## 2023-01-09 MED ORDER — ACETAMINOPHEN 500 MG PO TABS
1000.0000 mg | ORAL_TABLET | Freq: Once | ORAL | Status: AC
  Administered 2023-01-09: 1000 mg via ORAL
  Filled 2023-01-09: qty 2

## 2023-01-09 MED ORDER — PANTOPRAZOLE SODIUM 40 MG PO TBEC
40.0000 mg | DELAYED_RELEASE_TABLET | Freq: Every day | ORAL | Status: DC
  Administered 2023-01-10 – 2023-01-13 (×4): 40 mg via ORAL
  Filled 2023-01-09 (×4): qty 1

## 2023-01-09 MED ORDER — FLEET ENEMA RE ENEM: 1.0000 | ENEMA | Freq: Every day | RECTAL | Status: DC | PRN

## 2023-01-09 MED ORDER — AMIODARONE HCL IN DEXTROSE 360-4.14 MG/200ML-% IV SOLN
30.0000 mg/h | INTRAVENOUS | Status: DC
  Administered 2023-01-10: 30 mg/h via INTRAVENOUS
  Filled 2023-01-09: qty 200

## 2023-01-09 MED ORDER — PANCRELIPASE (LIP-PROT-AMYL) 36000-114000 UNITS PO CPEP
36000.0000 [IU] | ORAL_CAPSULE | Freq: Three times a day (TID) | ORAL | Status: DC
  Administered 2023-01-09 – 2023-01-13 (×11): 36000 [IU] via ORAL
  Filled 2023-01-09 (×11): qty 1

## 2023-01-09 MED ORDER — DILTIAZEM HCL-DEXTROSE 125-5 MG/125ML-% IV SOLN (PREMIX)
5.0000 mg/h | INTRAVENOUS | Status: DC
  Administered 2023-01-09: 5 mg/h via INTRAVENOUS
  Filled 2023-01-09: qty 125

## 2023-01-09 MED ORDER — ENOXAPARIN SODIUM 40 MG/0.4ML IJ SOSY: 40.0000 mg | PREFILLED_SYRINGE | INTRAMUSCULAR | Status: DC

## 2023-01-09 MED ORDER — FUROSEMIDE 10 MG/ML IJ SOLN
20.0000 mg | Freq: Once | INTRAMUSCULAR | Status: AC
  Administered 2023-01-09: 20 mg via INTRAVENOUS
  Filled 2023-01-09: qty 2

## 2023-01-09 MED ORDER — VANCOMYCIN HCL 1500 MG/300ML IV SOLN
1500.0000 mg | INTRAVENOUS | Status: DC
  Filled 2023-01-09: qty 300

## 2023-01-09 MED ORDER — LACTATED RINGERS IV BOLUS (SEPSIS)
1000.0000 mL | Freq: Once | INTRAVENOUS | Status: AC
  Administered 2023-01-09: 1000 mL via INTRAVENOUS

## 2023-01-09 MED ORDER — SODIUM CHLORIDE 0.9 % IV SOLN
2.0000 g | INTRAVENOUS | Status: DC
  Administered 2023-01-09: 2 g via INTRAVENOUS
  Filled 2023-01-09: qty 20

## 2023-01-09 MED ORDER — MAGNESIUM SULFATE 2 GM/50ML IV SOLN
2.0000 g | Freq: Once | INTRAVENOUS | Status: AC
  Administered 2023-01-09: 2 g via INTRAVENOUS
  Filled 2023-01-09: qty 50

## 2023-01-09 MED ORDER — VANCOMYCIN HCL IN DEXTROSE 1-5 GM/200ML-% IV SOLN: 1000.0000 mg | Freq: Once | INTRAVENOUS | Status: DC

## 2023-01-09 MED ORDER — DULOXETINE HCL 60 MG PO CPEP
60.0000 mg | ORAL_CAPSULE | Freq: Every day | ORAL | Status: DC
  Administered 2023-01-10 – 2023-01-13 (×4): 60 mg via ORAL
  Filled 2023-01-09: qty 2
  Filled 2023-01-09 (×3): qty 1

## 2023-01-09 MED ORDER — LEVOTHYROXINE SODIUM 75 MCG PO TABS
175.0000 ug | ORAL_TABLET | Freq: Every day | ORAL | Status: DC
  Administered 2023-01-09 – 2023-01-13 (×5): 175 ug via ORAL
  Filled 2023-01-09 (×6): qty 1

## 2023-01-09 MED ORDER — AMIODARONE HCL IN DEXTROSE 360-4.14 MG/200ML-% IV SOLN
60.0000 mg/h | INTRAVENOUS | Status: DC
  Administered 2023-01-09 – 2023-01-10 (×2): 60 mg/h via INTRAVENOUS
  Filled 2023-01-09 (×2): qty 200

## 2023-01-09 MED ORDER — IOHEXOL 350 MG/ML SOLN
75.0000 mL | Freq: Once | INTRAVENOUS | Status: AC | PRN
  Administered 2023-01-09: 75 mL via INTRAVENOUS

## 2023-01-09 MED ORDER — DIGOXIN 125 MCG PO TABS
0.1250 mg | ORAL_TABLET | Freq: Every day | ORAL | Status: DC
  Administered 2023-01-09 – 2023-01-13 (×5): 0.125 mg via ORAL
  Filled 2023-01-09 (×5): qty 1

## 2023-01-09 NOTE — Sepsis Progress Note (Signed)
Code Sepsis protocol being monitored by e[employer]. 

## 2023-01-09 NOTE — ED Provider Notes (Signed)
[place] EMERGENCY DEPARTMENT AT [facility] Provider Note   CSN: [id number] Arrival date & time: 01/09/23  4098     History  Chief Complaint  Patient presents with   Shortness of Breath    Victor Huber is a 72 y.o. male with metastatic lung cancer, CHF, COPD, Afib on eliquis, T2DM, chronic pancreatitis, HTN, HLD, hypothyroidism who presents with SOB. Patient was sob and clammy, causing him to slide out of the bed and wife lowered him to the ground. Wife at bedside denies any fall or head trauma. Patient also denies head trauma/headache. He denies any pain from the "fall." Patient found by EMS to be hypoxic 88% on room air and in afib at a rate of 208 bpm. They administered 20mg  of IV diltiazem with improvement in rate to 140s. Patient denies SOB on arrival to ED. Pt's wife reports fever of 101 at home this morning. Has h/o TURP w/ urinary retention. He had foley catheter removed yesterday.  Has had subsequent polyuria since that time. Denies cough, CP, SOB, abd pain, nausea/vomiting/diarrhea, leg swelling.   On presentation patient T 104F rectally and severe rigoring/chills, cold sweats. He is intermittently confused, trying to get out of bed.   Past Medical History:  Diagnosis Date   Adenocarcinoma of left lung, stage 3 (HCC) 08/15/2016   Allergy    Anxiety    Arthritis    Atrial fibrillation (HCC) 08/15/2016   Atrial fibrillation (HCC)    Cancer, metastatic to bone [facility])    lung   Cataract    Waiting to schedule bilateral cataract surgery   CHF (congestive heart failure) (HCC) 01/04/2022   COPD (chronic obstructive pulmonary disease) (HCC) 08/15/2016   Depression    Diabetes mellitus, type 2 (HCC)    Dysphagia    Dysrhythmia    a-fib   GERD (gastroesophageal reflux disease)    Heart murmur    atenlol for A Fib/Eliquis   History of chemotherapy    History of radiation therapy    Hyperlipidemia    Hypertension    Hypothyroid 08/15/2016   Longstanding  persistent atrial fibrillation (HCC) 08/29/2016   Pathologic fracture    left femur   Pneumonitis    S/P TURP 08/15/2016   T2DM (type 2 diabetes mellitus) (HCC) 05/03/2022   Wears glasses    Wears hearing aid in both ears        Home Medications Prior to Admission medications   Medication Sig Start Date End Date Taking? Authorizing Provider  Accu-Chek Softclix Lancets lancets Use as instructed 09/17/22  Yes [name], MD  acetaminophen (TYLENOL) 500 MG tablet Take 500 mg by mouth every 8 (eight) hours as needed for moderate pain.   Yes [provider]  albuterol (VENTOLIN HFA) 108 (90 Base) MCG/ACT inhaler Inhale 2 puffs into the lungs every 6 (six) hours as needed for wheezing or shortness of breath. 11/01/22  Yes Leslye Peer, MD  atorvastatin (LIPITOR) 40 MG tablet TAKE 1 TABLET EVERY DAY 06/11/22  Yes [name], MD  bisoprolol (ZEBETA) 5 MG tablet TAKE 1 TABLET EVERY DAY 09/12/22  Yes Chilton Si, MD  buPROPion (WELLBUTRIN XL) 150 MG 24 hr tablet Take 1 tablet (150 mg total) by mouth daily. 12/19/22  Yes [name], MD  diazepam (VALIUM) 5 MG tablet Take 1 tablet (5 mg total) by mouth every 12 (twelve) hours as needed for anxiety. 12/19/22  Yes [name], MD  digoxin (LANOXIN) 0.125 MG  tablet Take 1 tablet (0.125 mg total) by mouth daily. 06/01/22  Yes Chilton Si, MD  DULoxetine (CYMBALTA) 60 MG capsule TAKE 1 CAPSULE EVERY DAY 04/20/22  Yes [name], MD  ELIQUIS 5 MG TABS tablet TAKE 1 TABLET TWICE DAILY 12/04/22  Yes Chilton Si, MD  Empagliflozin-metFORMIN HCl (SYNJARDY) 5-500 MG TABS Take 1 tablet by mouth in the morning and at bedtime. 08/02/22  Yes [name], MD  esomeprazole (NEXIUM) 40 MG capsule TAKE 1 CAPSULE (40 MG TOTAL) BY MOUTH 2 (TWO) TIMES DAILY BEFORE A MEAL. 02/06/22  Yes [name], [name], MD  gabapentin (NEURONTIN) 300 MG capsule TAKE 2 CAPSULES AT BEDTIME AS NEEDED 09/04/22  Yes [name], MD  glucose  blood test strip Use as instructed 09/17/22  Yes [name], MD  levothyroxine (SYNTHROID) 175 MCG tablet TAKE 1 TABLET EVERY DAY 12/17/22  Yes [name], MD  lipase/protease/amylase (CREON) 36000 UNITS CPEP capsule Take 4 capsules po during each meal. Take 1-2 capsules po during each snack.( up to 2 snacks daily). 01/03/23  Yes [name], [name], MD  sucralfate (CARAFATE) 1 g tablet TAKE 1 TABLET FOUR TIMES DAILY WITH MEALS AND AT BEDTIME 12/17/22  Yes [name], [name], MD  tamsulosin (FLOMAX) 0.4 MG CAPS capsule TAKE 1 CAPSULE EVERY DAY 01/22/22  Yes [name], MD  Tiotropium Bromide-Olodaterol (STIOLTO RESPIMAT) 2.5-2.5 MCG/ACT AERS Inhale 2 puffs into the lungs daily. 07/20/22  Yes Leslye Peer, MD      Allergies    Carboplatin, Flecainide, and Debrox [carbamide peroxide]    Review of Systems   Review of Systems A 10 point review of systems was performed and is negative unless otherwise reported in HPI.  Physical Exam Updated Vital Signs BP 102/68   Pulse (!) 124   Temp 99.6 F (37.6 C) (Oral)   Resp (!) 22   Ht 6\' 1"  (1.854 m)   Wt 97.5 kg   SpO2 92%   BMI 28.37 kg/m  Physical Exam General: Acutely toxic and ill-appearing male, lying in bed. Diaphoretic. Trying to get out of bed and sit up repeatedly. HEENT: PERRLA, Sclera anicteric, MMM, trachea midline.  Cardiology: irregularly irregular tachycardic rate, no murmurs/rubs/gallops.  Resp: Normal respiratory effort. Mildly tachypneic. CTAB, no wheezes, rhonchi, crackles.  Abd: TTP in suprapubic region. Soft, non-distended. No rebound tenderness or guarding.  GU: Deferred. MSK: No peripheral edema or signs of trauma. Extremities without deformity or TTP. No cyanosis or clubbing. Skin: warm, wet. Back: No CVA tenderness Neuro: A&Ox3 (intermittently confused to situation), CNs II-XII grossly intact. MAEs. Sensation grossly intact.  Psych: Cooperative and at times jovial but also intermittently  confused and extremely uncomfortable  ED Results / Procedures / Treatments   Labs (all labs ordered are listed, but only abnormal results are displayed) Labs Reviewed  CBC WITH DIFFERENTIAL/PLATELET - Abnormal; Notable for the following components:      Result Value   WBC 12.6 (*)    Platelets 135 (*)    Neutro Abs 11.3 (*)    All other components within normal limits  BRAIN NATRIURETIC PEPTIDE - Abnormal; Notable for the following components:   B Natriuretic Peptide 127.2 (*)    All other components within normal limits  MAGNESIUM - Abnormal; Notable for the following components:   Magnesium 1.6 (*)    All other components within normal limits  COMPREHENSIVE METABOLIC PANEL - Abnormal; Notable for the following components:   CO2 18 (*)  Glucose, Bld 220 (*)    Creatinine, Ser 1.39 (*)    GFR, Estimated 53 (*)    Anion gap 17 (*)    All other components within normal limits  URINALYSIS, W/ REFLEX TO CULTURE (INFECTION SUSPECTED) - Abnormal; Notable for the following components:   APPearance HAZY (*)    Glucose, UA >=500 (*)    Hgb urine dipstick MODERATE (*)    Ketones, ur 5 (*)    Nitrite POSITIVE (*)    Leukocytes,Ua MODERATE (*)    Bacteria, UA MANY (*)    All other components within normal limits  I-STAT VENOUS BLOOD GAS, ED - Abnormal; Notable for the following components:   pCO2, Ven 40.0 (*)    pO2, Ven 71 (*)    Bicarbonate 19.1 (*)    TCO2 20 (*)    Acid-base deficit 7.0 (*)    All other components within normal limits  I-STAT CG4 LACTIC ACID, ED - Abnormal; Notable for the following components:   Lactic Acid, Venous 3.5 (*)    All other components within normal limits  SARS CORONAVIRUS 2 BY RT PCR  CULTURE, BLOOD (ROUTINE X 2)  CULTURE, BLOOD (ROUTINE X 2)  URINE CULTURE  PROTIME-INR  APTT  I-STAT CG4 LACTIC ACID, ED    EKG EKG Interpretation Date/Time:  Wednesday January 09 2023 10:40:25 EDT Ventricular Rate:  153 PR Interval:    QRS  Duration:  117 QT Interval:  279 QTC Calculation: 446 R Axis:   123  Text Interpretation: Atrial fibrillation with rapid V-rate Right bundle branch block Repolarization abnormality, prob rate related Confirmed by [name] [phone]) on 01/09/2023 10:52:18 AM  Radiology CT ABDOMEN PELVIS W CONTRAST  Result Date: 01/09/2023 CLINICAL DATA:  Sepsis, abdominal pain, PE suspected, hypoxia, history of lung cancer * Tracking Code: BO * EXAM: CT ANGIOGRAPHY CHEST WITH CONTRAST CT ABDOMEN PELVIS WITH CONTRAST TECHNIQUE: Multidetector CT imaging of the chest was performed using the standard protocol during bolus administration of intravenous contrast. Multiplanar CT image reconstructions and MIPs were obtained to evaluate the vascular anatomy. Multidetector CT imaging of the abdomen and pelvis was performed using the standard protocol following bolus administration of intravenous contrast. RADIATION DOSE REDUCTION: This exam was performed according to the departmental dose-optimization program which includes automated exposure control, adjustment of the mA and/or kV according to patient size and/or use of iterative reconstruction technique. CONTRAST:  75mL OMNIPAQUE IOHEXOL 350 MG/ML SOLN COMPARISON:  CT chest abdomen pelvis, 08/16/2022 FINDINGS: CT CHEST ANGIOGRAM FINDINGS Cardiovascular: Satisfactory opacification of the pulmonary arteries to the segmental level. No evidence of pulmonary embolism. Cardiomegaly. Three-vessel coronary artery calcifications. No pericardial effusion. Aortic atherosclerosis. Right chest port catheter. Mediastinum/Nodes: Interval enlargement of right paratracheal nodes measuring up to 2.4 x 1.2 cm, previously 1.8 x 0.7 cm (series 8, image 55). Interval enlargement of subcarinal nodes measuring up to 2.6 x 1.7 cm, previously 2.3 x 1.1 cm (series 8, image 89). Thyroid gland, trachea, and esophagus demonstrate no significant findings. Lungs/Pleura: Unchanged post treatment/postoperative  appearance of the dependent left lower lobe with dense consolidation and fibrosis about a suture line (series 9, image 81). Severe emphysema. No pleural effusion or pneumothorax. Musculoskeletal: No chest wall abnormality. No acute osseous findings. Review of the MIP images confirms the above findings. CT ABDOMEN PELVIS FINDINGS Hepatobiliary: No solid liver abnormality is seen. Gallstones. Gallbladder wall thickening, or biliary dilatation. Pancreas: Diffusely atrophic pancreas. No pancreatic ductal dilatation or surrounding inflammatory changes. Spleen: Normal in size without significant  abnormality. Adrenals/Urinary Tract: Unchanged benign adenoma of the medial limb of the right adrenal gland requiring no specific further follow-up or characterization. (Series 5, image 29). Simple parapelvic and renal cortical cysts, for which no further follow-up or characterization is required. Kidneys are otherwise normal, without renal calculi, solid lesion, or hydronephrosis. Bladder is unremarkable. Stomach/Bowel: Stomach is within normal limits. Appendix appears normal. No evidence of bowel wall thickening, distention, or inflammatory changes. Moderate burden of stool and stool balls throughout the colon and rectum. Vascular/Lymphatic: No significant vascular findings are present. No enlarged abdominal or pelvic lymph nodes. Reproductive: Mild prostatomegaly.  TURP defect. Other: Small fat containing epigastric hernia (series 5, image 54). Small fat containing bilateral inguinal hernias. No ascites. Musculoskeletal: No acute or significant osseous findings. IMPRESSION: 1. Negative examination for pulmonary embolism. 2. Unchanged post treatment/postoperative appearance of the dependent left lower lobe. 3. Interval enlargement of right paratracheal and subcarinal lymph nodes, nonspecific, possibly reactive although worrisome for nodal metastatic disease. These could be characterized for abnormal metabolic activity by PET-CT  on a nonemergent, outpatient basis if desired. 4. No acute CT findings of the abdomen or pelvis. 5. Cholelithiasis without evidence of acute cholecystitis. 6. Severe emphysema. 7. Coronary artery disease. Aortic Atherosclerosis (ICD10-I70.0) and Emphysema (ICD10-J43.9). Electronically Signed   By: Jearld Lesch M.D.   On: 01/09/2023 13:01   CT Angio Chest PE W and/or Wo Contrast  Result Date: 01/09/2023 CLINICAL DATA:  Sepsis, abdominal pain, PE suspected, hypoxia, history of lung cancer * Tracking Code: BO * EXAM: CT ANGIOGRAPHY CHEST WITH CONTRAST CT ABDOMEN PELVIS WITH CONTRAST TECHNIQUE: Multidetector CT imaging of the chest was performed using the standard protocol during bolus administration of intravenous contrast. Multiplanar CT image reconstructions and MIPs were obtained to evaluate the vascular anatomy. Multidetector CT imaging of the abdomen and pelvis was performed using the standard protocol following bolus administration of intravenous contrast. RADIATION DOSE REDUCTION: This exam was performed according to the departmental dose-optimization program which includes automated exposure control, adjustment of the mA and/or kV according to patient size and/or use of iterative reconstruction technique. CONTRAST:  75mL OMNIPAQUE IOHEXOL 350 MG/ML SOLN COMPARISON:  CT chest abdomen pelvis, 08/16/2022 FINDINGS: CT CHEST ANGIOGRAM FINDINGS Cardiovascular: Satisfactory opacification of the pulmonary arteries to the segmental level. No evidence of pulmonary embolism. Cardiomegaly. Three-vessel coronary artery calcifications. No pericardial effusion. Aortic atherosclerosis. Right chest port catheter. Mediastinum/Nodes: Interval enlargement of right paratracheal nodes measuring up to 2.4 x 1.2 cm, previously 1.8 x 0.7 cm (series 8, image 55). Interval enlargement of subcarinal nodes measuring up to 2.6 x 1.7 cm, previously 2.3 x 1.1 cm (series 8, image 89). Thyroid gland, trachea, and esophagus demonstrate no  significant findings. Lungs/Pleura: Unchanged post treatment/postoperative appearance of the dependent left lower lobe with dense consolidation and fibrosis about a suture line (series 9, image 81). Severe emphysema. No pleural effusion or pneumothorax. Musculoskeletal: No chest wall abnormality. No acute osseous findings. Review of the MIP images confirms the above findings. CT ABDOMEN PELVIS FINDINGS Hepatobiliary: No solid liver abnormality is seen. Gallstones. Gallbladder wall thickening, or biliary dilatation. Pancreas: Diffusely atrophic pancreas. No pancreatic ductal dilatation or surrounding inflammatory changes. Spleen: Normal in size without significant abnormality. Adrenals/Urinary Tract: Unchanged benign adenoma of the medial limb of the right adrenal gland requiring no specific further follow-up or characterization. (Series 5, image 29). Simple parapelvic and renal cortical cysts, for which no further follow-up or characterization is required. Kidneys are otherwise normal, without renal calculi, solid lesion, or hydronephrosis.  Bladder is unremarkable. Stomach/Bowel: Stomach is within normal limits. Appendix appears normal. No evidence of bowel wall thickening, distention, or inflammatory changes. Moderate burden of stool and stool balls throughout the colon and rectum. Vascular/Lymphatic: No significant vascular findings are present. No enlarged abdominal or pelvic lymph nodes. Reproductive: Mild prostatomegaly.  TURP defect. Other: Small fat containing epigastric hernia (series 5, image 54). Small fat containing bilateral inguinal hernias. No ascites. Musculoskeletal: No acute or significant osseous findings. IMPRESSION: 1. Negative examination for pulmonary embolism. 2. Unchanged post treatment/postoperative appearance of the dependent left lower lobe. 3. Interval enlargement of right paratracheal and subcarinal lymph nodes, nonspecific, possibly reactive although worrisome for nodal metastatic  disease. These could be characterized for abnormal metabolic activity by PET-CT on a nonemergent, outpatient basis if desired. 4. No acute CT findings of the abdomen or pelvis. 5. Cholelithiasis without evidence of acute cholecystitis. 6. Severe emphysema. 7. Coronary artery disease. Aortic Atherosclerosis (ICD10-I70.0) and Emphysema (ICD10-J43.9). Electronically Signed   By: Jearld Lesch M.D.   On: 01/09/2023 13:01   DG Chest Portable 1 View  Result Date: 01/09/2023 CLINICAL DATA:  sepsis.  Shortness of breath.  Fall. EXAM: PORTABLE CHEST 1 VIEW COMPARISON:  04/04/2017. FINDINGS: Bilateral lungs appear hyperlucent with coarse bronchovascular markings, in keeping with COPD. There are probable atelectatic changes at the left lung base. Bilateral lungs otherwise appear clear. No dense consolidation or lung collapse. Subtle blunting of left lateral costophrenic angle may be due to trace pleural effusion versus overlying lung parenchymal opacities. Right lateral costophrenic angle is clear. Mildly enlarged cardio-mediastinal silhouette, which is accentuated by AP technique. No acute osseous abnormalities. The soft tissues are within normal limits. Right-sided CT Port-A-Cath is seen with its tip overlying the midportion of superior vena cava. IMPRESSION: 1. COPD.  Probable atelectatic changes at the left lung base. 2. Subtle blunting of the left lateral costophrenic angle may be due to trace pleural effusion versus overlying lung parenchymal opacities. 3. Mild cardiomegaly. Electronically Signed   By: Jules Schick M.D.   On: 01/09/2023 12:18    Procedures .Critical Care  Performed by: Loetta Rough, MD Authorized by: Loetta Rough, MD   Critical care provider statement:    Critical care time (minutes):  60   Critical care was necessary to treat or prevent imminent or life-threatening deterioration of the following conditions:  Circulatory failure and sepsis   Critical care was time spent personally  by me on the following activities:  Development of treatment plan with patient or surrogate, evaluation of patient's response to treatment, examination of patient, ordering and review of laboratory studies, ordering and review of radiographic studies, ordering and performing treatments and interventions, pulse oximetry, re-evaluation of patient's condition, review of old charts and obtaining history from patient or surrogate   Care discussed with: admitting provider       Medications Ordered in ED Medications  vancomycin (VANCOREADY) IVPB 2000 mg/400 mL (2,000 mg Intravenous New Bag/Given 01/09/23 1150)  diltiazem (CARDIZEM) 125 mg in dextrose 5% 125 mL (1 mg/mL) infusion (5 mg/hr Intravenous New Bag/Given 01/09/23 1142)  lactated ringers bolus 1,000 mL (0 mLs Intravenous Stopped 01/09/23 1144)  ceFEPIme (MAXIPIME) 2 g in sodium chloride 0.9 % 100 mL IVPB (0 g Intravenous Stopped 01/09/23 1114)  metroNIDAZOLE (FLAGYL) IVPB 500 mg (0 mg Intravenous Stopped 01/09/23 1143)  acetaminophen (TYLENOL) tablet 1,000 mg (1,000 mg Oral Given 01/09/23 1040)  diltiazem (CARDIZEM) 25 MG/5ML injection (15 mg  Given 01/09/23 1117)  iohexol (OMNIPAQUE) 350  MG/ML injection 75 mL (75 mLs Intravenous Contrast Given 01/09/23 1216)    ED Course/ Medical Decision Making/ A&P                          Medical Decision Making Amount and/or Complexity of Data Reviewed Labs: ordered. Decision-making details documented in ED Course. Radiology: ordered. Decision-making details documented in ED Course.  Risk OTC drugs. Prescription drug management. Decision regarding hospitalization.    This patient presents to the ED for concern of fever, rigoring, SOB; this involves an extensive number of treatment options, and is a complaint that carries with it a high risk of complications and morbidity.  I considered the following differential and admission for this acute, potentially life threatening condition.   MDM:    Patient  presents acutely ill-appearing, febrile to 104, with heart rates in the 160s to 180s A-fib with RVR on the monitor.  With fever, tachycardia, tachypnea he is SIRS positive and will initiate sepsis protocol.  Gratefully he is not hypotensive.  Given that he Foley catheter removed yesterday and has suprapubic tenderness palpation I suspect likely urosepsis is the cause.  Blood cultures are drawn and vancomycin/cefepime/metronidazole was initiated.  Given his severity of his illness and abdominal tenderness palpation will get a CT of and pelvis as well to investigate for intra-abdominal source of infection such as diverticulitis, appendicitis, or septic stone.  He also initially reported shortness of breath and was reportedly hypoxic with EMS.  He is tachypneic here not hypoxic.  Also consider respiratory source of infection such as COVID, pneumonia.  Will obtain a CT chest for further evaluation.  For his heart rate, he does have a history of permanent A-fib.  Likely sepsis and fever are contributing to his heart rate and will treat for any underlying infection as a cause of A-fib with RVR with fluids, Tylenol, and antibiotics.  He did already see if diltiazem with EMS and his heart rate intermittently goes up still more than 200 which is not sustainable for patient, will start a diltiazem bolus and infusion but will allow some tachycardia up to 120 bpm.  Clinical Course as of 01/09/23 1310  Wed Jan 09, 2023  1116 HR as high as 180 bpm-100 bpm afib w/ RVR. Gave a diltiazem bolus 15 mg IV. HR down to 130s-160s. Will start diltiazem infusion with goal HR 100-120 bpm, as tachycardia likely also due to sepsis [HN]  1130 WBC(!): 12.6 +leukocytosis [HN]  1131 SARS Coronavirus 2 by RT PCR: NEGATIVE [HN]  1137 B Natriuretic Peptide(!): 127.2 Mildly elevated [HN]  1137 Creatinine(!): 1.39 +AKI [HN]  1217 Urinalysis, w/ Reflex to Culture (Infection Suspected) -Urine, Clean Catch(!) +UTI [HN]  1244 Pt much more  comfortable appearing. Defervesced to 99.98F, coherent conversations with family, no longer rigoring. HR 110 bpm on dlitiazem 5 mg /hr.  [HN]  1245 Lactic Acid, Venous(!!): 3.5 Prior to fluids [HN]  1306 CT ABDOMEN PELVIS W CONTRAST 1. Negative examination for pulmonary embolism. 2. Unchanged post treatment/postoperative appearance of the dependent left lower lobe. 3. Interval enlargement of right paratracheal and subcarinal lymph nodes, nonspecific, possibly reactive although worrisome for nodal metastatic disease. These could be characterized for abnormal metabolic activity by PET-CT on a nonemergent, outpatient basis if desired. 4. No acute CT findings of the abdomen or pelvis. 5. Cholelithiasis without evidence of acute cholecystitis. 6. Severe emphysema. 7. Coronary artery disease.   [HN]    Clinical Course User Index [HN]  Loetta Rough, MD    Labs: I Ordered, and personally interpreted labs.  The pertinent results include:  those listed above  Imaging Studies ordered: I ordered imaging studies including CXR, CT PE, Ct abd pelvis I independently visualized and interpreted imaging. I agree with the radiologist interpretation  Additional history obtained from chart review, family at bedside.  External records from outside source obtained and reviewed including urology  Cardiac Monitoring: The patient was maintained on a cardiac monitor.  I personally viewed and interpreted the cardiac monitored which showed an underlying rhythm of: Afib w/ RVR  Reevaluation: After the interventions noted above, I reevaluated the patient and found that they have :improved  Social Determinants of Health: Lives with family  Disposition:  Admitted to medicine  Co morbidities that complicate the patient evaluation  Past Medical History:  Diagnosis Date   Adenocarcinoma of left lung, stage 3 (HCC) 08/15/2016   Allergy    Anxiety    Arthritis    Atrial fibrillation (HCC) 08/15/2016    Atrial fibrillation (HCC)    Cancer, metastatic to bone (HCC)    lung   Cataract    Waiting to schedule bilateral cataract surgery   CHF (congestive heart failure) (HCC) 01/04/2022   COPD (chronic obstructive pulmonary disease) (HCC) 08/15/2016   Depression    Diabetes mellitus, type 2 (HCC)    Dysphagia    Dysrhythmia    a-fib   GERD (gastroesophageal reflux disease)    Heart murmur    atenlol for A Fib/Eliquis   History of chemotherapy    History of radiation therapy    Hyperlipidemia    Hypertension    Hypothyroid 08/15/2016   Longstanding persistent atrial fibrillation (HCC) 08/29/2016   Pathologic fracture    left femur   Pneumonitis    S/P TURP 08/15/2016   T2DM (type 2 diabetes mellitus) (HCC) 05/03/2022   Wears glasses    Wears hearing aid in both ears      Medicines Meds ordered this encounter  Medications   lactated ringers bolus 1,000 mL    Order Specific Question:   Reason 30 mL/kg dose is not being ordered    Answer:   First Lactic Acid Pending   ceFEPIme (MAXIPIME) 2 g in sodium chloride 0.9 % 100 mL IVPB    Order Specific Question:   Antibiotic Indication:    Answer:   Other Indication (list below)    Order Specific Question:   Other Indication:    Answer:   Unknown source   metroNIDAZOLE (FLAGYL) IVPB 500 mg    Order Specific Question:   Antibiotic Indication:    Answer:   Other Indication (list below)    Order Specific Question:   Other Indication:    Answer:   Unknown source   DISCONTD: vancomycin (VANCOCIN) IVPB 1000 mg/200 mL premix    Order Specific Question:   Indication:    Answer:   Other Indication (list below)    Order Specific Question:   Other Indication:    Answer:   Unknown source   acetaminophen (TYLENOL) tablet 1,000 mg   vancomycin (VANCOREADY) IVPB 2000 mg/400 mL    Order Specific Question:   Indication:    Answer:   Other Indication (list below)    Order Specific Question:   Other Indication:    Answer:   Unknown source    diltiazem (CARDIZEM) 25 MG/5ML injection    Signa Kell L: cabinet override   diltiazem (CARDIZEM) 125 mg in dextrose  5% 125 mL (1 mg/mL) infusion   iohexol (OMNIPAQUE) 350 MG/ML injection 75 mL    I have reviewed the patients home medicines and have made adjustments as needed  Problem List / ED Course: Problem List Items Addressed This Visit   None Visit Diagnoses     Atrial fibrillation with rapid ventricular response (HCC)    -  Primary   Relevant Medications   diltiazem (CARDIZEM) 25 MG/5ML injection (Completed)   diltiazem (CARDIZEM) 125 mg in dextrose 5% 125 mL (1 mg/mL) infusion   Acute cystitis with hematuria       Sepsis, due to unspecified organism, unspecified whether acute organ dysfunction present [facility])                       This note was created using dictation software, which may contain spelling or grammatical errors.    Loetta Rough, MD 01/09/23 1316

## 2023-01-09 NOTE — Progress Notes (Signed)
Pharmacy Antibiotic Note  Victor Huber is a 72 y.o. male admitted on 01/09/2023 with sepsis.  Pharmacy has been consulted for vancomycin dosing.  Plan: Cefepime 2g q12h.  Patient has received 2g vancomycin load this morning, will start 1500mg  q24h for eAUC: 443. Vd: 0.72, Scr: 1.39. Follow culture data for de-escalation.  Monitor renal function for dose adjustments as indicated.  F/u MRSA PCR   Height: 6\' 1"  (185.4 cm) Weight: 97.5 kg (215 lb) IBW/kg (Calculated) : 79.9  Temp (24hrs), Avg:99.3 F (37.4 C), Min:98.4 F (36.9 C), Max:100.4 F (38 C)  Recent Labs  Lab 01/09/23 1028 01/09/23 1238 01/09/23 1536  WBC 12.6*  --   --   CREATININE 1.39*  --   --   LATICACIDVEN  --  3.5* 1.8    Estimated Creatinine Clearance: 57.3 mL/min (A) (by C-G formula based on SCr of 1.39 mg/dL (H)).    Allergies  Allergen Reactions   Carboplatin Itching, Nausea And Vomiting and Other (See Comments)    Flushing   Flecainide Hypertension    CAUSED HEART ISSUES    Debrox [Carbamide Peroxide]     Swelling in ear canal.     Thank you for allowing pharmacy to be a part of this patient's care.  Estill Batten, PharmD, BCCCP  01/09/2023 10:01 PM

## 2023-01-09 NOTE — Significant Event (Addendum)
HMS significant event note:  Called in to evaluate patient by RNs, due to rigors.  On my evaluation vital signs temp 100.4 F, BP 90 over 50s with MAP 67, heart rate up to 130s, desaturation to 86% on room air.  On exam rigoring, tachycardic and irregular, breath sounds clear bilaterally, abdomen without peritoneal signs.   Assessment: Worsening sepsis, source urinary A-fib recurrent RVR Acute hypoxic respiratory failure  Plan: -Broaden antibiotics to vancomycin, cefepime -Blood cultures were already drawn, and urine cultures also pending -Repeat lactate, ABG -Chest x-ray -LR 500 cc bolus (prior to this has gotten approx 2.4 L with 1.5 L bolus and 900 cc on rate.); will reevaluate additional IVF based on CXR considering his AHRF  -If remains in RVR and borderline hypotensive may need amiodarone -EKG -Notified cross covering MD   Dolly Rias, MD  [facility] Medicine [facility]

## 2023-01-09 NOTE — Progress Notes (Addendum)
Patient has been admitted earlier today for management of sepsis in the setting of UTI.  Patient bedside nurse reported that patient has high temperature 100 F.  Also he was found to have low blood pressure 99/51 and A-fib RVR heart rate in between 120-130.  Earlier he was on Cardizem drip which has been stopped due to hypotension.  Resumed home digoxin and bisoprolol.  Also patient became hypoxic O2 sat dropped to 84% on room air.  Placed facemask O2 sat has been improved to 99% on 4 L. - Checking a stat chest x-ray.  CT chest obtained earlier today which ruled out PE and no evidence of pneumonia. -Ordered 500 mL of LR bolus blood pressure has been improved 110/77.  Continue LR 150 cc/h. -EKG showed A-fib RVR heart rate 157.  At the bedside patient heart rate ranging between 140-160.  He received 1 dose of Lopressor 2.5 mg without any improvement of heart rate.  In the setting of hypotension cannot continue Cardizem drip in the case starting Amiodarone bolus and plan to continue amiodarone drip. -Patient received total 2.4 L of LR so far.  Patient's lactic acid is elevated 7.7.  If chest x-ray showed evidence of pulmonary vascular congestion we will give low-dose of Lasix and plan to continue LR 150 cc/h. -Broaden antibiotic to vancomycin and cefepime.  Addendum: -Chest x-ray showing COPD and atelectasis change of the lung base.  Subtle blunting of the left costophrenic angle due to pleural effusion versus parenchymal opacity.  Mild cardiomegaly. Plan: -Giving patient 1 dose of Lasix 20 mg IV and continue Xopenex as needed for wheezing shortness of breath  Tereasa Coop, MD [facility] 01/09/2023, 10:16 PM

## 2023-01-09 NOTE — H&P (Signed)
History and Physical   Victor Huber YQM:[id number] DOB: [birth year] DOA: 01/09/2023  PCP: [name], MD   Patient coming from: Home  Chief Complaint: Shortness of breath, fever, weakness  HPI: Victor Huber is a 72 y.o. male with medical history significant of hyperlipidemia, diabetes, atrial fibrillation, hypothyroidism, CHF, BPH, lung cancer, anxiety, depression, COPD, chronic pancreatitis presenting after episode of shortness of breath and weakness at home.  Patient has history of BPH status post TURP and had Foley catheter removed yesterday.  Since that time has had increased urinary frequency.  Noted to have fever today to 101 per family and was noted to have some shortness of breath and appeared clammy this morning and slid out of bed with assistance from wife, no true fall.  EMS was called and patient noted to be hypoxic at around 80% on room air with atrial fibrillation at a rate of 208 bpm.  He was given some IV diltiazem with improvement of rate to the 140s.  Brought to the ED for further evaluation.  Arrived with chills. Denies chest pain, abdominal pain, constipation, diarrhea, nausea, vomiting.  ED Course: Vital signs in the ED notable for fever as high as 104, heart rate in the 70s to 180s, blood pressure in the 100s to 140 systolic, respiratory rate in the 20s.  Lab workup included CMP with bicarb 18, gap 17, creatinine elevated to 1.39 from baseline of 0.9, glucose 220.  Magnesium 1.6.  PT, PTT, INR within normal limits.  BNP indeterminate at 127.  Lactic acid elevated to 3.5 with repeat pending.  COVID screen negative.  Urinalysis with glucose, hemoglobin, nitrates, leukocytes, bacteria.  Urine culture and blood culture pending.  Chest x-ray consistent with COPD with suspected left basilar atelectasis and questionable trace left pleural effusion.  Mild cardiomegaly.  CTA PE study showed evidence of severe emphysema, no PE, lymphadenopathy which may need further  nonemergent PET follow-up.  CT of the pelvis showed no acute abnormality.  Patient received vancomycin, cefepime, Flagyl in the ED.  Also started on diltiazem infusion and received Tylenol and a liter of fluids.  Review of Systems: As per HPI otherwise all other systems reviewed and are negative.  Past Medical History:  Diagnosis Date   Adenocarcinoma of left lung, stage 3 (HCC) 08/15/2016   Allergy    Anxiety    Arthritis    Atrial fibrillation (HCC) 08/15/2016   Atrial fibrillation (HCC)    Cancer, metastatic to bone [facility])    lung   Cataract    Waiting to schedule bilateral cataract surgery   CHF (congestive heart failure) (HCC) 01/04/2022   COPD (chronic obstructive pulmonary disease) (HCC) 08/15/2016   Depression    Diabetes mellitus, type 2 (HCC)    Dysphagia    Dysrhythmia    a-fib   GERD (gastroesophageal reflux disease)    Heart murmur    atenlol for A Fib/Eliquis   History of chemotherapy    History of radiation therapy    Hyperlipidemia    Hypertension    Hypothyroid 08/15/2016   Longstanding persistent atrial fibrillation (HCC) 08/29/2016   Pathologic fracture    left femur   Pneumonitis    S/P TURP 08/15/2016   T2DM (type 2 diabetes mellitus) (HCC) 05/03/2022   Wears glasses    Wears hearing aid in both ears     Past Surgical History:  Procedure Laterality Date   BIOPSY  12/21/2019   Procedure: BIOPSY;  Surgeon: Lemar Lofty., MD;  Location: [facility] ENDOSCOPY;  Service: Gastroenterology;;   BIOPSY  05/23/2020   Procedure: BIOPSY;  Surgeon: Lemar Lofty., MD;  Location: [facility]COPY;  Service: Gastroenterology;;   BRONCHOSCOPY  10/2014   CARDIAC CATHETERIZATION     05/07/12   CARDIOVERSION     x2   COLONOSCOPY     several yrs   DG BIOPSY LUNG Left 10/2014   FNA - Adenocarcinoma    ESOPHAGOGASTRODUODENOSCOPY (EGD) WITH PROPOFOL N/A 12/21/2019   Procedure: ESOPHAGOGASTRODUODENOSCOPY (EGD) WITH PROPOFOL;  Surgeon: Lemar Lofty.,  MD;  Location: [facility] ENDOSCOPY;  Service: Gastroenterology;  Laterality: N/A;   ESOPHAGOGASTRODUODENOSCOPY (EGD) WITH PROPOFOL N/A 05/23/2020   Procedure: ESOPHAGOGASTRODUODENOSCOPY (EGD) WITH PROPOFOL;  Surgeon: Meridee Score [name], MD;  Location: [facility] ENDOSCOPY;  Service: Gastroenterology;  Laterality: N/A;   ESOPHAGOGASTRODUODENOSCOPY (EGD) WITH PROPOFOL N/A 06/30/2020   Procedure: ESOPHAGOGASTRODUODENOSCOPY (EGD) WITH PROPOFOL;  Surgeon: Meridee Score [name], MD;  Location: [facility] ENDOSCOPY;  Service: Gastroenterology;  Laterality: N/A;  ultra slim scope avail   EUS N/A 12/21/2019   Procedure: UPPER ENDOSCOPIC ULTRASOUND (EUS) RADIAL;  Surgeon: Lemar Lofty., MD;  Location: [facility] ENDOSCOPY;  Service: Gastroenterology;  Laterality: N/A;   FEMUR IM NAIL Left 02/19/2017   FEMUR IM NAIL Left 02/19/2017   Procedure: INTRAMEDULLARY (IM) NAIL FEMORAL;  Surgeon: Teryl Lucy, MD;  Location: [facility] OR;  Service: Orthopedics;  Laterality: Left;   IR FLUORO GUIDE PORT INSERTION RIGHT  07/03/2017   IR US GUIDE VASC ACCESS RIGHT  07/03/2017   LUNG CANCER SURGERY Left 12/2014   Wedge Resection    MULTIPLE TOOTH EXTRACTIONS     SAVORY DILATION N/A 05/23/2020   Procedure: SAVORY DILATION;  Surgeon: Lemar Lofty., MD;  Location: [facility] ENDOSCOPY;  Service: Gastroenterology;  Laterality: N/A;   SAVORY DILATION N/A 06/30/2020   Procedure: SAVORY DILATION;  Surgeon: Meridee Score [name], MD;  Location: [facility] ENDOSCOPY;  Service: Gastroenterology;  Laterality: N/A;   Status post TURP     TONSILLECTOMY     UPPER GASTROINTESTINAL ENDOSCOPY      Social History  reports that he quit smoking about 10 years ago. His smoking use included cigarettes. He started smoking about 60 years ago. He has a 50 pack-year smoking history. He has never used smokeless tobacco. He reports that he does not drink alcohol and does not use drugs.  Allergies  Allergen Reactions   Carboplatin Itching, Nausea And Vomiting  and Other (See Comments)    Flushing   Flecainide Hypertension    CAUSED HEART ISSUES    Debrox [Carbamide Peroxide]     Swelling in ear canal.     Family History  Problem Relation Age of Onset   Breast cancer Mother    Breast cancer Maternal Aunt    Breast cancer Maternal Aunt    Lung disease Neg Hx    Colon cancer Neg Hx    Stomach cancer Neg Hx    Pancreatic cancer Neg Hx    Rectal cancer Neg Hx    Colon polyps Neg Hx    Esophageal cancer Neg Hx   Reviewed on admission  Prior to Admission medications   Medication Sig Start Date End Date Taking? Authorizing Provider  Accu-Chek Softclix Lancets lancets Use as instructed 09/17/22  Yes [name], MD  acetaminophen (TYLENOL) 500 MG tablet Take 500 mg by mouth every 8 (eight) hours as needed for moderate pain.   Yes [provider]  albuterol (VENTOLIN HFA) 108 (90 Base) MCG/ACT inhaler Inhale 2 puffs  into the lungs every 6 (six) hours as needed for wheezing or shortness of breath. 11/01/22  Yes Leslye Peer, MD  atorvastatin (LIPITOR) 40 MG tablet TAKE 1 TABLET EVERY DAY 06/11/22  Yes [name], MD  bisoprolol (ZEBETA) 5 MG tablet TAKE 1 TABLET EVERY DAY 09/12/22  Yes Chilton Si, MD  buPROPion (WELLBUTRIN XL) 150 MG 24 hr tablet Take 1 tablet (150 mg total) by mouth daily. 12/19/22  Yes [name], MD  diazepam (VALIUM) 5 MG tablet Take 1 tablet (5 mg total) by mouth every 12 (twelve) hours as needed for anxiety. 12/19/22  Yes [name], MD  digoxin (LANOXIN) 0.125 MG tablet Take 1 tablet (0.125 mg total) by mouth daily. 06/01/22  Yes Chilton Si, MD  DULoxetine (CYMBALTA) 60 MG capsule TAKE 1 CAPSULE EVERY DAY 04/20/22  Yes [name], MD  ELIQUIS 5 MG TABS tablet TAKE 1 TABLET TWICE DAILY 12/04/22  Yes Chilton Si, MD  Empagliflozin-metFORMIN HCl (SYNJARDY) 5-500 MG TABS Take 1 tablet by mouth in the morning and at bedtime. 08/02/22  Yes [name], MD  esomeprazole (NEXIUM) 40 MG  capsule TAKE 1 CAPSULE (40 MG TOTAL) BY MOUTH 2 (TWO) TIMES DAILY BEFORE A MEAL. 02/06/22  Yes [name], [name], MD  gabapentin (NEURONTIN) 300 MG capsule TAKE 2 CAPSULES AT BEDTIME AS NEEDED 09/04/22  Yes [name], MD  glucose blood test strip Use as instructed 09/17/22  Yes [name], MD  levothyroxine (SYNTHROID) 175 MCG tablet TAKE 1 TABLET EVERY DAY 12/17/22  Yes [name], MD  lipase/protease/amylase (CREON) 36000 UNITS CPEP capsule Take 4 capsules po during each meal. Take 1-2 capsules po during each snack.( up to 2 snacks daily). 01/03/23  Yes [name], [name], MD  sucralfate (CARAFATE) 1 g tablet TAKE 1 TABLET FOUR TIMES DAILY WITH MEALS AND AT BEDTIME 12/17/22  Yes [name], [name], MD  tamsulosin (FLOMAX) 0.4 MG CAPS capsule TAKE 1 CAPSULE EVERY DAY 01/22/22  Yes [name], MD  Tiotropium Bromide-Olodaterol (STIOLTO RESPIMAT) 2.5-2.5 MCG/ACT AERS Inhale 2 puffs into the lungs daily. 07/20/22  Yes Leslye Peer, MD    Physical Exam: Vitals:   01/09/23 1215 01/09/23 1226 01/09/23 1230 01/09/23 1330  BP: 115/63  102/68 (!) 100/56  Pulse: 82  (!) 124 (!) 104  Resp: (!) 22   20  Temp:  99.6 F (37.6 C)    TempSrc:  Oral    SpO2: 96%  92% 95%  Weight:      Height:        Physical Exam Constitutional:      General: He is not in acute distress.    Appearance: Normal appearance. He is ill-appearing.  HENT:     Head: Normocephalic and atraumatic.     Mouth/Throat:     Mouth: Mucous membranes are moist.     Pharynx: Oropharynx is clear.  Eyes:     Extraocular Movements: Extraocular movements intact.     Pupils: Pupils are equal, round, and reactive to light.  Cardiovascular:     Rate and Rhythm: Tachycardia present. Rhythm irregular.     Pulses: Normal pulses.     Heart sounds: Normal heart sounds.  Pulmonary:     Effort: Pulmonary effort is normal. No respiratory distress.     Breath sounds: Normal breath sounds.  Abdominal:      General: Bowel sounds are normal. There is no distension.     Palpations: Abdomen  is soft.     Tenderness: There is no abdominal tenderness.  Musculoskeletal:        General: No swelling or deformity.  Skin:    General: Skin is warm and dry.  Neurological:     General: No focal deficit present.     Mental Status: Mental status is at baseline.     Labs on Admission: I have personally reviewed following labs and imaging studies  CBC: Recent Labs  Lab 01/09/23 1028 01/09/23 1101  WBC 12.6*  --   NEUTROABS 11.3*  --   HGB 16.1 16.0  HCT 47.0 47.0  MCV 94.0  --   PLT 135*  --     Basic Metabolic Panel: Recent Labs  Lab 01/09/23 1028 01/09/23 1101  NA 140 140  K 4.5 4.4  CL 105  --   CO2 18*  --   GLUCOSE 220*  --   BUN 18  --   CREATININE 1.39*  --   CALCIUM 9.4  --   MG 1.6*  --     GFR: Estimated Creatinine Clearance: 57.3 mL/min (A) (by C-G formula based on SCr of 1.39 mg/dL (H)).  Liver Function Tests: Recent Labs  Lab 01/09/23 1028  AST 27  ALT 22  ALKPHOS 74  BILITOT 0.7  PROT 6.7  ALBUMIN 4.0    Urine analysis:    Component Value Date/Time   COLORURINE YELLOW 01/09/2023 1120   APPEARANCEUR HAZY (A) 01/09/2023 1120   LABSPEC 1.021 01/09/2023 1120   PHURINE 5.0 01/09/2023 1120   GLUCOSEU >=500 (A) 01/09/2023 1120   GLUCOSEU NEGATIVE 03/06/2019 0956   HGBUR MODERATE (A) 01/09/2023 1120   BILIRUBINUR NEGATIVE 01/09/2023 1120   BILIRUBINUR negative 12/20/2022 1119   KETONESUR 5 (A) 01/09/2023 1120   PROTEINUR NEGATIVE 01/09/2023 1120   UROBILINOGEN 0.2 12/20/2022 1119   UROBILINOGEN 0.2 03/06/2019 0956   NITRITE POSITIVE (A) 01/09/2023 1120   LEUKOCYTESUR MODERATE (A) 01/09/2023 1120    Radiological Exams on Admission: CT ABDOMEN PELVIS W CONTRAST  Result Date: 01/09/2023 CLINICAL DATA:  Sepsis, abdominal pain, PE suspected, hypoxia, history of lung cancer * Tracking Code: BO * EXAM: CT ANGIOGRAPHY CHEST WITH CONTRAST CT ABDOMEN PELVIS  WITH CONTRAST TECHNIQUE: Multidetector CT imaging of the chest was performed using the standard protocol during bolus administration of intravenous contrast. Multiplanar CT image reconstructions and MIPs were obtained to evaluate the vascular anatomy. Multidetector CT imaging of the abdomen and pelvis was performed using the standard protocol following bolus administration of intravenous contrast. RADIATION DOSE REDUCTION: This exam was performed according to the departmental dose-optimization program which includes automated exposure control, adjustment of the mA and/or kV according to patient size and/or use of iterative reconstruction technique. CONTRAST:  75mL OMNIPAQUE IOHEXOL 350 MG/ML SOLN COMPARISON:  CT chest abdomen pelvis, 08/16/2022 FINDINGS: CT CHEST ANGIOGRAM FINDINGS Cardiovascular: Satisfactory opacification of the pulmonary arteries to the segmental level. No evidence of pulmonary embolism. Cardiomegaly. Three-vessel coronary artery calcifications. No pericardial effusion. Aortic atherosclerosis. Right chest port catheter. Mediastinum/Nodes: Interval enlargement of right paratracheal nodes measuring up to 2.4 x 1.2 cm, previously 1.8 x 0.7 cm (series 8, image 55). Interval enlargement of subcarinal nodes measuring up to 2.6 x 1.7 cm, previously 2.3 x 1.1 cm (series 8, image 89). Thyroid gland, trachea, and esophagus demonstrate no significant findings. Lungs/Pleura: Unchanged post treatment/postoperative appearance of the dependent left lower lobe with dense consolidation and fibrosis about a suture line (series 9, image 81). Severe emphysema. No pleural effusion  or pneumothorax. Musculoskeletal: No chest wall abnormality. No acute osseous findings. Review of the MIP images confirms the above findings. CT ABDOMEN PELVIS FINDINGS Hepatobiliary: No solid liver abnormality is seen. Gallstones. Gallbladder wall thickening, or biliary dilatation. Pancreas: Diffusely atrophic pancreas. No pancreatic  ductal dilatation or surrounding inflammatory changes. Spleen: Normal in size without significant abnormality. Adrenals/Urinary Tract: Unchanged benign adenoma of the medial limb of the right adrenal gland requiring no specific further follow-up or characterization. (Series 5, image 29). Simple parapelvic and renal cortical cysts, for which no further follow-up or characterization is required. Kidneys are otherwise normal, without renal calculi, solid lesion, or hydronephrosis. Bladder is unremarkable. Stomach/Bowel: Stomach is within normal limits. Appendix appears normal. No evidence of bowel wall thickening, distention, or inflammatory changes. Moderate burden of stool and stool balls throughout the colon and rectum. Vascular/Lymphatic: No significant vascular findings are present. No enlarged abdominal or pelvic lymph nodes. Reproductive: Mild prostatomegaly.  TURP defect. Other: Small fat containing epigastric hernia (series 5, image 54). Small fat containing bilateral inguinal hernias. No ascites. Musculoskeletal: No acute or significant osseous findings. IMPRESSION: 1. Negative examination for pulmonary embolism. 2. Unchanged post treatment/postoperative appearance of the dependent left lower lobe. 3. Interval enlargement of right paratracheal and subcarinal lymph nodes, nonspecific, possibly reactive although worrisome for nodal metastatic disease. These could be characterized for abnormal metabolic activity by PET-CT on a nonemergent, outpatient basis if desired. 4. No acute CT findings of the abdomen or pelvis. 5. Cholelithiasis without evidence of acute cholecystitis. 6. Severe emphysema. 7. Coronary artery disease. Aortic Atherosclerosis (ICD10-I70.0) and Emphysema (ICD10-J43.9). Electronically Signed   By: Jearld Lesch M.D.   On: 01/09/2023 13:01   CT Angio Chest PE W and/or Wo Contrast  Result Date: 01/09/2023 CLINICAL DATA:  Sepsis, abdominal pain, PE suspected, hypoxia, history of lung cancer *  Tracking Code: BO * EXAM: CT ANGIOGRAPHY CHEST WITH CONTRAST CT ABDOMEN PELVIS WITH CONTRAST TECHNIQUE: Multidetector CT imaging of the chest was performed using the standard protocol during bolus administration of intravenous contrast. Multiplanar CT image reconstructions and MIPs were obtained to evaluate the vascular anatomy. Multidetector CT imaging of the abdomen and pelvis was performed using the standard protocol following bolus administration of intravenous contrast. RADIATION DOSE REDUCTION: This exam was performed according to the departmental dose-optimization program which includes automated exposure control, adjustment of the mA and/or kV according to patient size and/or use of iterative reconstruction technique. CONTRAST:  75mL OMNIPAQUE IOHEXOL 350 MG/ML SOLN COMPARISON:  CT chest abdomen pelvis, 08/16/2022 FINDINGS: CT CHEST ANGIOGRAM FINDINGS Cardiovascular: Satisfactory opacification of the pulmonary arteries to the segmental level. No evidence of pulmonary embolism. Cardiomegaly. Three-vessel coronary artery calcifications. No pericardial effusion. Aortic atherosclerosis. Right chest port catheter. Mediastinum/Nodes: Interval enlargement of right paratracheal nodes measuring up to 2.4 x 1.2 cm, previously 1.8 x 0.7 cm (series 8, image 55). Interval enlargement of subcarinal nodes measuring up to 2.6 x 1.7 cm, previously 2.3 x 1.1 cm (series 8, image 89). Thyroid gland, trachea, and esophagus demonstrate no significant findings. Lungs/Pleura: Unchanged post treatment/postoperative appearance of the dependent left lower lobe with dense consolidation and fibrosis about a suture line (series 9, image 81). Severe emphysema. No pleural effusion or pneumothorax. Musculoskeletal: No chest wall abnormality. No acute osseous findings. Review of the MIP images confirms the above findings. CT ABDOMEN PELVIS FINDINGS Hepatobiliary: No solid liver abnormality is seen. Gallstones. Gallbladder wall thickening, or  biliary dilatation. Pancreas: Diffusely atrophic pancreas. No pancreatic ductal dilatation or surrounding inflammatory changes. Spleen:  Normal in size without significant abnormality. Adrenals/Urinary Tract: Unchanged benign adenoma of the medial limb of the right adrenal gland requiring no specific further follow-up or characterization. (Series 5, image 29). Simple parapelvic and renal cortical cysts, for which no further follow-up or characterization is required. Kidneys are otherwise normal, without renal calculi, solid lesion, or hydronephrosis. Bladder is unremarkable. Stomach/Bowel: Stomach is within normal limits. Appendix appears normal. No evidence of bowel wall thickening, distention, or inflammatory changes. Moderate burden of stool and stool balls throughout the colon and rectum. Vascular/Lymphatic: No significant vascular findings are present. No enlarged abdominal or pelvic lymph nodes. Reproductive: Mild prostatomegaly.  TURP defect. Other: Small fat containing epigastric hernia (series 5, image 54). Small fat containing bilateral inguinal hernias. No ascites. Musculoskeletal: No acute or significant osseous findings. IMPRESSION: 1. Negative examination for pulmonary embolism. 2. Unchanged post treatment/postoperative appearance of the dependent left lower lobe. 3. Interval enlargement of right paratracheal and subcarinal lymph nodes, nonspecific, possibly reactive although worrisome for nodal metastatic disease. These could be characterized for abnormal metabolic activity by PET-CT on a nonemergent, outpatient basis if desired. 4. No acute CT findings of the abdomen or pelvis. 5. Cholelithiasis without evidence of acute cholecystitis. 6. Severe emphysema. 7. Coronary artery disease. Aortic Atherosclerosis (ICD10-I70.0) and Emphysema (ICD10-J43.9). Electronically Signed   By: Jearld Lesch M.D.   On: 01/09/2023 13:01   DG Chest Portable 1 View  Result Date: 01/09/2023 CLINICAL DATA:  sepsis.   Shortness of breath.  Fall. EXAM: PORTABLE CHEST 1 VIEW COMPARISON:  04/04/2017. FINDINGS: Bilateral lungs appear hyperlucent with coarse bronchovascular markings, in keeping with COPD. There are probable atelectatic changes at the left lung base. Bilateral lungs otherwise appear clear. No dense consolidation or lung collapse. Subtle blunting of left lateral costophrenic angle may be due to trace pleural effusion versus overlying lung parenchymal opacities. Right lateral costophrenic angle is clear. Mildly enlarged cardio-mediastinal silhouette, which is accentuated by AP technique. No acute osseous abnormalities. The soft tissues are within normal limits. Right-sided CT Port-A-Cath is seen with its tip overlying the midportion of superior vena cava. IMPRESSION: 1. COPD.  Probable atelectatic changes at the left lung base. 2. Subtle blunting of the left lateral costophrenic angle may be due to trace pleural effusion versus overlying lung parenchymal opacities. 3. Mild cardiomegaly. Electronically Signed   By: Jules Schick M.D.   On: 01/09/2023 12:18    EKG: Independently reviewed.  Atrial fibrillation with RVR to 153 bpm.  Assessment/Plan Active Problems:   Chronic atrial fibrillation (HCC)   Chronic obstructive pulmonary disease (HCC)   Centrilobular emphysema (HCC)   Hyperlipidemia   Anxiety disorder   Malignant neoplasm metastatic to bone (HCC)   Adenocarcinoma of left lung, stage 4 (HCC)   BPH s/p TURP   Chronic pancreatitis (HCC)   Depression, major, single episode, moderate (HCC)   CHF (congestive heart failure) (HCC)   T2DM (type 2 diabetes mellitus) (HCC)   Sepsis secondary to UTI > Presenting after fever, urinary frequency, shortness of breath, chills for about a day. > Symptoms started after having Foley catheter removed yesterday. > Found to be in A-fib with RVR as well and AKI as below.  Meets sepsis criteria given this, fever to 104, tachycardia, tachypnea, leukocytosis to  12.6. > urinalysis with hemoglobin, nitrates, leukocytes, bacteria. > No evidence of infection on CT chest abdomen or pelvis. > Has received broad-spectrum antibiotics including vancomycin, cefepime, Flagyl in the ED.  Also received 1 L IV fluids and blood pressure  remains greater than 100 systolic. > Initial lactic acid 3.5 with repeat to be drawn shortly. - Monitor on progressive unit - Narrow antibiotics to ceftriaxone - Continue with IV fluids - Follow-up repeat lactic acid - Trend fever curve and WBC - Supportive care  Atrial fibrillation with RVR > Rates initially as high as 208 upon EMS evaluation.  Did improve somewhat with IV diltiazem and route.  Has since required initiation of diltiazem infusion with rates now improved though blood pressure low normal in the 100s systolic. > Given the setting of sepsis above, if blood pressure is consistently in the 90s we will plan to transition to IV amiodarone infusion. - Monitor on progressive unit as above - Continue with diltiazem infusion for now - Continue home Eliquis - Continue home bisoprolol  AKI Hypomagnesemia > Creatinine elevated to 1.39 from baseline 0.9 necessitating of sepsis as above.  Magnesium noted to be 3.9. - Continue with IV fluids as above - Trend renal function and electrolytes - 2 g IV magnesium  Hyperlipidemia - Continue atorvastatin  Diabetes - SSI  Hypothyroidism - Continue home Synthroid  Chronic combined systolic CHF > Last echo showed EF 40-45%, indeterminate diastolic function, mildly reduced RV function. Not currently on diuretic. - Continue home digoxin, bisoprolol - On IV fluids for sepsis as above - Trend renal function and electrolytes - Strict I's and O's, daily weights  BPH > Status post TURP > Foley catheter out yesterday > Admission for  sepsis secondary to UTI today - Continue home Flomax  Lung cancer > Non-small cell lung cancer metastatic to bone with lymphadenopathy.  Status  post wedge resection of left lower lobe mass and lymph node dissection.  Status post chemoradiation.  Status post further palliative radiotherapy of metastatic bone disease.  Had been on Keytruda, this was held since January 2022. > Enlarged lymphadenopathy on CT chest.  Possibly reactive in the setting of sepsis, nonemergent PET follow-up recommended, discussed with family. - Noted  Anxiety Depression - Continue home Wellbutrin, duloxetine, as needed Valium  COPD - Replace home Stiolto with formulary Brovana and Incruse - As needed albuterol  Chronic pancreatitis - Continue home Creon  DVT prophylaxis: Eliquis Code Status:   Full Family Communication:  Updated bedside Disposition Plan:   Patient is from:  Home  Anticipated DC to:  Home  Anticipated DC date:  2 to 5 days  Anticipated DC barriers: None  Consults called:  None Admission status:  Inpatient, progressive  Severity of Illness: The appropriate patient status for this patient is INPATIENT. Inpatient status is judged to be reasonable and necessary in order to provide the required intensity of service to ensure the patient's safety. The patient's presenting symptoms, physical exam findings, and initial radiographic and laboratory data in the context of their chronic comorbidities is felt to place them at high risk for further clinical deterioration. Furthermore, it is not anticipated that the patient will be medically stable for discharge from the hospital within 2 midnights of admission.   * I certify that at the point of admission it is my clinical judgment that the patient will require inpatient hospital care spanning beyond 2 midnights from the point of admission due to high intensity of service, high risk for further deterioration and high frequency of surveillance required.Synetta Fail MD [facility]  How to contact the [facility] Attending or Consulting provider 7A - 7P or covering provider during after  hours 7P -7A, for this patient?  Check the care team in [facility] and look for a) attending/consulting [facility] provider listed and b) the [facility] team listed Log into [email or web address] and use [facility] universal password to access. If you do not have the password, please contact the hospital operator. Locate the [facility] ([facility]) provider you are looking for under [facility] and page to a number that you can be directly reached. If you still have difficulty reaching the provider, please page the [facility] (Director on Call) for the Hospitalists listed on amion for assistance.  01/09/2023, 2:15 PM

## 2023-01-09 NOTE — Progress Notes (Signed)
ED Pharmacy Antibiotic Sign Off An antibiotic consult was received from an ED provider for vancomycin and cefepime per pharmacy dosing for unknown source. A chart review was completed to assess appropriateness.   The following one time order(s) were placed:  Vancomycin 2000 mg x1  Cefepime 2000 mg x1   Further antibiotic and/or antibiotic pharmacy consults should be ordered by the admitting provider if indicated.   Thank you for allowing pharmacy to be a part of this patient's care.   Cedric Fishman, [facility]  Clinical Pharmacist 01/09/23 10:22 AM

## 2023-01-09 NOTE — ED Triage Notes (Signed)
Patient arrives from home with EMS after wife called 911 because her patient was sob and clammy, causing him to fall. No injury from the fall, as she lowered him to the ground. Patient found by EMS to be hypoxic 88% on room air and in afib at a rate of 208. They administered 20mg  of diltiazem with improvement in rate to 140s. Patient denies sob on arrival to ED. Pt's wife reports fever of 101 at home this morning and had foley catheter removed yesterday. Polyuria since then.

## 2023-01-10 ENCOUNTER — Encounter (HOSPITAL_COMMUNITY): Payer: Self-pay | Admitting: Internal Medicine

## 2023-01-10 DIAGNOSIS — C3492 Malignant neoplasm of unspecified part of left bronchus or lung: Secondary | ICD-10-CM | POA: Diagnosis not present

## 2023-01-10 DIAGNOSIS — E1169 Type 2 diabetes mellitus with other specified complication: Secondary | ICD-10-CM

## 2023-01-10 DIAGNOSIS — N401 Enlarged prostate with lower urinary tract symptoms: Secondary | ICD-10-CM

## 2023-01-10 DIAGNOSIS — I4891 Unspecified atrial fibrillation: Secondary | ICD-10-CM

## 2023-01-10 DIAGNOSIS — F419 Anxiety disorder, unspecified: Secondary | ICD-10-CM

## 2023-01-10 DIAGNOSIS — J441 Chronic obstructive pulmonary disease with (acute) exacerbation: Secondary | ICD-10-CM

## 2023-01-10 DIAGNOSIS — A419 Sepsis, unspecified organism: Secondary | ICD-10-CM

## 2023-01-10 DIAGNOSIS — I482 Chronic atrial fibrillation, unspecified: Secondary | ICD-10-CM

## 2023-01-10 DIAGNOSIS — N390 Urinary tract infection, site not specified: Secondary | ICD-10-CM

## 2023-01-10 DIAGNOSIS — C7951 Secondary malignant neoplasm of bone: Secondary | ICD-10-CM

## 2023-01-10 DIAGNOSIS — F321 Major depressive disorder, single episode, moderate: Secondary | ICD-10-CM

## 2023-01-10 DIAGNOSIS — J432 Centrilobular emphysema: Secondary | ICD-10-CM

## 2023-01-10 DIAGNOSIS — I5042 Chronic combined systolic (congestive) and diastolic (congestive) heart failure: Secondary | ICD-10-CM

## 2023-01-10 DIAGNOSIS — R3911 Hesitancy of micturition: Secondary | ICD-10-CM

## 2023-01-10 LAB — COMPREHENSIVE METABOLIC PANEL
ALT: 23 U/L (ref 0–44)
AST: 30 U/L (ref 15–41)
Albumin: 3.2 g/dL — ABNORMAL LOW (ref 3.5–5.0)
Alkaline Phosphatase: 56 U/L (ref 38–126)
Anion gap: 12 (ref 5–15)
BUN: 21 mg/dL (ref 8–23)
CO2: 20 mmol/L — ABNORMAL LOW (ref 22–32)
Calcium: 8.6 mg/dL — ABNORMAL LOW (ref 8.9–10.3)
Chloride: 103 mmol/L (ref 98–111)
Creatinine, Ser: 1.19 mg/dL (ref 0.61–1.24)
GFR, Estimated: >60 mL/min (ref >60)
Glucose, Bld: 210 mg/dL — ABNORMAL HIGH (ref 70–99)
Potassium: 3.4 mmol/L — ABNORMAL LOW (ref 3.5–5.1)
Sodium: 135 mmol/L (ref 135–145)
Total Bilirubin: 0.9 mg/dL (ref 0.3–1.2)
Total Protein: 5.5 g/dL — ABNORMAL LOW (ref 6.5–8.1)

## 2023-01-10 LAB — GLUCOSE, CAPILLARY
Glucose-Capillary: 181 mg/dL — ABNORMAL HIGH (ref 70–99)
Glucose-Capillary: 186 mg/dL — ABNORMAL HIGH (ref 70–99)
Glucose-Capillary: 196 mg/dL — ABNORMAL HIGH (ref 70–99)

## 2023-01-10 LAB — PROTIME-INR
INR: 1.3 — ABNORMAL HIGH (ref 0.8–1.2)
Prothrombin Time: 16.7 s — ABNORMAL HIGH (ref 11.4–15.2)

## 2023-01-10 LAB — CBC
HCT: 36.3 % — ABNORMAL LOW (ref 39.0–52.0)
Hemoglobin: 13.3 g/dL (ref 13.0–17.0)
MCH: 34.5 pg — ABNORMAL HIGH (ref 26.0–34.0)
MCHC: 36.6 g/dL — ABNORMAL HIGH (ref 30.0–36.0)
MCV: 94 fL (ref 80.0–100.0)
Platelets: 114 10*3/uL — ABNORMAL LOW (ref 150–400)
RBC: 3.86 MIL/uL — ABNORMAL LOW (ref 4.22–5.81)
RDW: 14.5 % (ref 11.5–15.5)
WBC: 15.6 10*3/uL — ABNORMAL HIGH (ref 4.0–10.5)
nRBC: 0 % (ref 0.0–0.2)

## 2023-01-10 LAB — HEMOGLOBIN AND HEMATOCRIT, BLOOD
HCT: 34.5 % — ABNORMAL LOW (ref 39.0–52.0)
Hemoglobin: 12.2 g/dL — ABNORMAL LOW (ref 13.0–17.0)

## 2023-01-10 LAB — MRSA NEXT GEN BY PCR, NASAL: MRSA by PCR Next Gen: NOT DETECTED

## 2023-01-10 LAB — CORTISOL-AM, BLOOD: Cortisol - AM: 24.8 ug/dL — ABNORMAL HIGH (ref 6.7–22.6)

## 2023-01-10 LAB — LACTIC ACID, PLASMA
Lactic Acid, Venous: 1.7 mmol/L (ref 0.5–1.9)
Lactic Acid, Venous: 1.4 mmol/L (ref 0.5–1.9)
Lactic Acid, Venous: 1.7 mmol/L (ref 0.5–1.9)

## 2023-01-10 LAB — PROCALCITONIN: Procalcitonin: 37.76 ng/mL

## 2023-01-10 LAB — CBG MONITORING, ED
Glucose-Capillary: 234 mg/dL — ABNORMAL HIGH (ref 70–99)
Glucose-Capillary: 186 mg/dL — ABNORMAL HIGH (ref 70–99)

## 2023-01-10 LAB — MAGNESIUM: Magnesium: 2.1 mg/dL (ref 1.7–2.4)

## 2023-01-10 MED ORDER — CHLORHEXIDINE GLUCONATE CLOTH 2 % EX PADS
6.0000 | MEDICATED_PAD | Freq: Every day | CUTANEOUS | Status: DC
  Administered 2023-01-10 – 2023-01-13 (×4): 6 via TOPICAL

## 2023-01-10 MED ORDER — SODIUM CHLORIDE 0.9 % IV SOLN
2.0000 g | Freq: Three times a day (TID) | INTRAVENOUS | Status: DC
  Administered 2023-01-10 – 2023-01-11 (×4): 2 g via INTRAVENOUS
  Filled 2023-01-10 (×4): qty 12.5

## 2023-01-10 MED ORDER — INSULIN GLARGINE-YFGN 100 UNIT/ML ~~LOC~~ SOLN
10.0000 [IU] | Freq: Every day | SUBCUTANEOUS | Status: DC
  Administered 2023-01-10 – 2023-01-13 (×4): 10 [IU] via SUBCUTANEOUS
  Filled 2023-01-10 (×4): qty 0.1

## 2023-01-10 MED ORDER — LACTATED RINGERS IV SOLN: INTRAVENOUS | Status: DC

## 2023-01-10 MED ORDER — INFLUENZA VAC A&B SURF ANT ADJ 0.5 ML IM SUSY
0.5000 mL | PREFILLED_SYRINGE | INTRAMUSCULAR | Status: DC
  Filled 2023-01-10: qty 0.5

## 2023-01-10 MED ORDER — VANCOMYCIN HCL IN DEXTROSE 1-5 GM/200ML-% IV SOLN
1000.0000 mg | Freq: Two times a day (BID) | INTRAVENOUS | Status: DC
  Administered 2023-01-10 – 2023-01-11 (×2): 1000 mg via INTRAVENOUS
  Filled 2023-01-10 (×2): qty 200

## 2023-01-10 NOTE — ED Notes (Signed)
ED TO INPATIENT HANDOFF REPORT  ED Nurse Name and Phone #: Osvaldo Shipper RN [phone]  S Name/Age/Gender Victor Huber 72 y.o. male Room/Bed: 024C/024C  Code Status   Code Status: Full Code  Home/SNF/Other Home Patient oriented to: self, place, time, and situation Is this baseline? Yes   Triage Complete: Triage complete  Chief Complaint Sepsis secondary to UTI (HCC) [A41.9, N39.0]  Triage Note Patient arrives from home with EMS after wife called 911 because her patient was sob and clammy, causing him to fall. No injury from the fall, as she lowered him to the ground. Patient found by EMS to be hypoxic 88% on room air and in afib at a rate of 208. They administered 20mg  of diltiazem with improvement in rate to 140s. Patient denies sob on arrival to ED. Pt's wife reports fever of 101 at home this morning and had foley catheter removed yesterday. Polyuria since then.    Allergies Allergies  Allergen Reactions   Carboplatin Itching, Nausea And Vomiting and Other (See Comments)    Flushing   Flecainide Hypertension    CAUSED HEART ISSUES    Debrox [Carbamide Peroxide]     Swelling in ear canal.     Level of Care/Admitting Diagnosis ED Disposition     ED Disposition  Admit   Condition  --   Comment  Hospital Area: [facility] [[id number]]  Level of Care: Progressive [102]  Admit to Progressive based on following criteria: CARDIOVASCULAR & THORACIC of moderate stability with acute coronary syndrome symptoms/low risk myocardial infarction/hypertensive urgency/arrhythmias/heart failure potentially compromising stability and stable post cardiovascular intervention patients.  Admit to Progressive based on following criteria: MULTISYSTEM THREATS such as stable sepsis, metabolic/electrolyte imbalance with or without encephalopathy that is responding to early treatment.  May admit patient to [facility] or [facility] if equivalent level of care is available:: No   Covid Evaluation: Confirmed COVID Negative  Diagnosis: Sepsis secondary to UTI [facility]) [[id number]]  Admitting Physician: Synetta Fail [[id number]]  Attending Physician: Synetta Fail [[id number]]  Certification:: I certify this patient will need inpatient services for at least 2 midnights  Expected Medical Readiness: 01/11/2023          B Medical/Surgery History Past Medical History:  Diagnosis Date   Adenocarcinoma of left lung, stage 3 (HCC) 08/15/2016   Allergy    Anxiety    Arthritis    Atrial fibrillation (HCC) 08/15/2016   Atrial fibrillation (HCC)    Cancer, metastatic to bone (HCC)    lung   Cataract    Waiting to schedule bilateral cataract surgery   CHF (congestive heart failure) (HCC) 01/04/2022   COPD (chronic obstructive pulmonary disease) (HCC) 08/15/2016   Depression    Diabetes mellitus, type 2 (HCC)    Dysphagia    Dysrhythmia    a-fib   GERD (gastroesophageal reflux disease)    Heart murmur    atenlol for A Fib/Eliquis   History of chemotherapy    History of radiation therapy    Hyperlipidemia    Hypertension    Hypothyroid 08/15/2016   Longstanding persistent atrial fibrillation (HCC) 08/29/2016   Pathologic fracture    left femur   Pneumonitis    S/P TURP 08/15/2016   T2DM (type 2 diabetes mellitus) (HCC) 05/03/2022   Wears glasses    Wears hearing aid in both ears    Past Surgical History:  Procedure Laterality Date   BIOPSY  12/21/2019   Procedure: BIOPSY;  Surgeon: Corliss Parish  Montez Hageman., MD;  Location: [facility] ENDOSCOPY;  Service: Gastroenterology;;   BIOPSY  05/23/2020   Procedure: BIOPSY;  Surgeon: Lemar Lofty., MD;  Location: [facility] ENDOSCOPY;  Service: Gastroenterology;;   BRONCHOSCOPY  10/2014   CARDIAC CATHETERIZATION     05/07/12   CARDIOVERSION     x2   COLONOSCOPY     several yrs   DG BIOPSY LUNG Left 10/2014   FNA - Adenocarcinoma    ESOPHAGOGASTRODUODENOSCOPY (EGD) WITH PROPOFOL N/A 12/21/2019   Procedure:  ESOPHAGOGASTRODUODENOSCOPY (EGD) WITH PROPOFOL;  Surgeon: Lemar Lofty., MD;  Location[facility] ENDOSCOPY;  Service: Gastroenterology;  Laterality: N/A;   ESOPHAGOGASTRODUODENOSCOPY (EGD) WITH PROPOFOL N/A 05/23/2020   Procedure: ESOPHAGOGASTRODUODENOSCOPY (EGD) WITH PROPOFOL;  Surgeon: Meridee Score [name], MD;  Location: [facility] ENDOSCOPY;  Service: Gastroenterology;  Laterality: N/A;   ESOPHAGOGASTRODUODENOSCOPY (EGD) WITH PROPOFOL N/A 06/30/2020   Procedure: ESOPHAGOGASTRODUODENOSCOPY (EGD) WITH PROPOFOL;  Surgeon: Meridee Score [name], MD;  Location: [facility] ENDOSCOPY;  Service: Gastroenterology;  Laterality: N/A;  ultra slim scope avail   EUS N/A 12/21/2019   Procedure: UPPER ENDOSCOPIC ULTRASOUND (EUS) RADIAL;  Surgeon: Lemar Lofty., MD;  Location: [facility] ENDOSCOPY;  Service: Gastroenterology;  Laterality: N/A;   FEMUR IM NAIL Left 02/19/2017   FEMUR IM NAIL Left 02/19/2017   Procedure: INTRAMEDULLARY (IM) NAIL FEMORAL;  Surgeon: Teryl Lucy, MD;  Location: [facility] OR;  Service: Orthopedics;  Laterality: Left;   IR FLUORO GUIDE PORT INSERTION RIGHT  07/03/2017   IR US GUIDE VASC ACCESS RIGHT  07/03/2017   LUNG CANCER SURGERY Left 12/2014   Wedge Resection    MULTIPLE TOOTH EXTRACTIONS     SAVORY DILATION N/A 05/23/2020   Procedure: SAVORY DILATION;  Surgeon: Lemar Lofty., MD;  Location: [facility] ENDOSCOPY;  Service: Gastroenterology;  Laterality: N/A;   SAVORY DILATION N/A 06/30/2020   Procedure: SAVORY DILATION;  Surgeon: Meridee Score [name], MD;  Location: [facility] ENDOSCOPY;  Service: Gastroenterology;  Laterality: N/A;   Status post TURP     TONSILLECTOMY     UPPER GASTROINTESTINAL ENDOSCOPY       A IV Location/Drains/Wounds Patient Lines/Drains/Airways Status     Active Line/Drains/Airways     Name Placement date Placement time Site Days   Implanted Port 07/03/17 Right Chest 07/03/17  1256  Chest  2017   Peripheral IV 01/09/23 20 G Anterior;Left Forearm 01/09/23   1002  Forearm  1            Intake/Output Last 24 hours  Intake/Output Summary (Last 24 hours) at 01/10/2023 1102 Last data filed at 01/10/2023 0444 Gross per 24 hour  Intake 2317.96 ml  Output 600 ml  Net 1717.96 ml    Labs/Imaging Results for orders placed or performed during the hospital encounter of 01/09/23 (from the past 48 hour(s))  CBC with Differential     Status: Abnormal   Collection Time: 01/09/23 10:28 AM  Result Value Ref Range   WBC 12.6 (H) 4.0 - 10.5 K/uL   RBC 5.00 4.22 - 5.81 MIL/uL   Hemoglobin 16.1 13.0 - 17.0 g/dL   HCT 16.1 09.6 - 04.5 %   MCV 94.0 80.0 - 100.0 fL   MCH 32.2 26.0 - 34.0 pg   MCHC 34.3 30.0 - 36.0 g/dL   RDW 40.9 81.1 - 91.4 %   Platelets 135 (L) 150 - 400 K/uL   nRBC 0.0 0.0 - 0.2 %   Neutrophils Relative % 90 %   Neutro Abs 11.3 (H) 1.7 - 7.7 K/uL   Lymphocytes  Relative 9 %   Lymphs Abs 1.1 0.7 - 4.0 K/uL   Monocytes Relative 1 %   Monocytes Absolute 0.1 0.1 - 1.0 K/uL   Eosinophils Relative 0 %   Eosinophils Absolute 0.0 0.0 - 0.5 K/uL   Basophils Relative 0 %   Basophils Absolute 0.0 0.0 - 0.1 K/uL   Immature Granulocytes 0 %   Abs Immature Granulocytes 0.04 0.00 - 0.07 K/uL    Comment: Performed at [facility], [address] [place], [place] [address]  Brain natriuretic peptide     Status: Abnormal   Collection Time: 01/09/23 10:28 AM  Result Value Ref Range   B Natriuretic Peptide 127.2 (H) 0.0 - 100.0 pg/mL    Comment: Performed at [facility], [address], [place], [place] [address]  SARS Coronavirus 2 by RT PCR (hospital order, performed in [facility] hospital lab) *cepheid single result test* Line, Central     Status: None   Collection Time: 01/09/23 10:28 AM   Specimen: Line, Central; Nasal Swab  Result Value Ref Range   SARS Coronavirus 2 by RT PCR NEGATIVE NEGATIVE    Comment: Performed at [facility] Lab, [address], [place], [place] [address]  Magnesium     Status: Abnormal    Collection Time: 01/09/23 10:28 AM  Result Value Ref Range   Magnesium 1.6 (L) 1.7 - 2.4 mg/dL    Comment: Performed at [facility], [address] [place], [place] [address]  Comprehensive metabolic panel     Status: Abnormal   Collection Time: 01/09/23 10:28 AM  Result Value Ref Range   Sodium 140 135 - 145 mmol/L   Potassium 4.5 3.5 - 5.1 mmol/L   Chloride 105 98 - 111 mmol/L   CO2 18 (L) 22 - 32 mmol/L   Glucose, Bld 220 (H) 70 - 99 mg/dL    Comment: Glucose reference range applies only to samples taken after fasting for at least 8 hours.   BUN 18 8 - 23 mg/dL   Creatinine, Ser 9.52 (H) 0.61 - 1.24 mg/dL   Calcium 9.4 8.9 - 84.1 mg/dL   Total Protein 6.7 6.5 - 8.1 g/dL   Albumin 4.0 3.5 - 5.0 g/dL   AST 27 15 - 41 U/L   ALT 22 0 - 44 U/L   Alkaline Phosphatase 74 38 - 126 U/L   Total Bilirubin 0.7 0.3 - 1.2 mg/dL   GFR, Estimated 53 (L) >60 mL/min    Comment: (NOTE) Calculated using the CKD-EPI Creatinine Equation (2021)    Anion gap 17 (H) 5 - 15    Comment: Performed at [facility], [address], [place], [place] [address]  Protime-INR     Status: None   Collection Time: 01/09/23 10:28 AM  Result Value Ref Range   Prothrombin Time 13.6 11.4 - 15.2 seconds   INR 1.0 0.8 - 1.2    Comment: (NOTE) INR goal varies based on device and disease states. Performed at [facility], [address] [place], [place] [address]   APTT     Status: None   Collection Time: 01/09/23 10:28 AM  Result Value Ref Range   aPTT 25 24 - 36 seconds    Comment: Performed at [facility], [address], [place], [place] [address]  Blood culture (routine x 2)     Status: None (Preliminary result)   Collection Time: 01/09/23 10:32 AM  Specimen: BLOOD LEFT ARM  Result Value Ref Range   Specimen Description BLOOD LEFT ARM    Special Requests      BOTTLES DRAWN AEROBIC AND ANAEROBIC Blood Culture adequate volume Performed at [facility], [address] [place], [place] [address]    Culture PENDING    Report Status PENDING   I-Stat venous blood gas, ([facility] ED, [facility], [facility])     Status: Abnormal   Collection Time: 01/09/23 11:01 AM  Result Value Ref Range   pH, Ven 7.288 7.25 - 7.43   pCO2, Ven 40.0 (L) 44 - 60 mmHg   pO2, Ven 71 (H) 32 - 45 mmHg   Bicarbonate 19.1 (L) 20.0 - 28.0 mmol/L   TCO2 20 (L) 22 - 32 mmol/L   O2 Saturation 92 %   Acid-base deficit 7.0 (H) 0.0 - 2.0 mmol/L   Sodium 140 135 - 145 mmol/L   Potassium 4.4 3.5 - 5.1 mmol/L   Calcium, Ion 1.17 1.15 - 1.40 mmol/L   HCT 47.0 39.0 - 52.0 %   Hemoglobin 16.0 13.0 - 17.0 g/dL   Sample type VENOUS   Urinalysis, w/ Reflex to Culture (Infection Suspected) -Urine, Clean Catch     Status: Abnormal   Collection Time: 01/09/23 11:20 AM  Result Value Ref Range   Specimen Source URINE, CLEAN CATCH    Color, Urine YELLOW YELLOW   APPearance HAZY (A) CLEAR   Specific Gravity, Urine 1.021 1.005 - 1.030   pH 5.0 5.0 - 8.0   Glucose, UA >=500 (A) NEGATIVE mg/dL   Hgb urine dipstick MODERATE (A) NEGATIVE   Bilirubin Urine NEGATIVE NEGATIVE   Ketones, ur 5 (A) NEGATIVE mg/dL   Protein, ur NEGATIVE NEGATIVE mg/dL   Nitrite POSITIVE (A) NEGATIVE   Leukocytes,Ua MODERATE (A) NEGATIVE   RBC / HPF 11-20 0 - 5 RBC/hpf   WBC, UA 21-50 0 - 5 WBC/hpf    Comment:        Reflex urine culture not performed if WBC <=10, OR if Squamous epithelial cells >5. If Squamous epithelial cells >5 suggest recollection.    Bacteria, UA MANY (A) NONE SEEN   Squamous Epithelial / HPF 0-5 0 - 5 /HPF   WBC Clumps PRESENT     Comment: Performed at [facility], [address] [place], [place] [address]  Urine Culture     Status: Abnormal (Preliminary result)   Collection Time: 01/09/23 11:20 AM   Specimen: Urine, Random  Result Value Ref Range   Specimen Description URINE, RANDOM    Special Requests NONE Reflexed from [id number]    Culture (A)     >=100,000 COLONIES/mL GRAM NEGATIVE RODS CULTURE  REINCUBATED FOR BETTER GROWTH SUSCEPTIBILITIES TO FOLLOW Performed at [facility], [address], [place], [place] [address]    Report Status PENDING   I-Stat Lactic Acid, ED     Status: Abnormal   Collection Time: 01/09/23 12:38 PM  Result Value Ref Range   Lactic Acid, Venous 3.5 (HH) 0.5 - 1.9 mmol/L   Comment NOTIFIED PHYSICIAN   I-Stat Lactic Acid, ED     Status: None   Collection Time: 01/09/23  3:36 PM  Result Value Ref Range   Lactic Acid, Venous 1.8 0.5 - 1.9 mmol/L  CBG monitoring, ED     Status: Abnormal   Collection Time: 01/09/23  4:29 PM  Result Value Ref Range   Glucose-Capillary 227 (H) 70 - 99 mg/dL  Comment: Glucose reference range applies only to samples taken after fasting for at least 8 hours.  Lactic acid, plasma     Status: Abnormal   Collection Time: 01/09/23  9:59 PM  Result Value Ref Range   Lactic Acid, Venous 7.7 (HH) 0.5 - 1.9 mmol/L    Comment: CRITICAL RESULT CALLED TO, READ BACK BY AND VERIFIED WITH MERRICKS. Elbert Ewings RN @ [phone] 01/09/23 JBUTLER Performed at [facility], [address], [place], [place] [address]   I-Stat arterial blood gas, ED     Status: Abnormal   Collection Time: 01/09/23 10:23 PM  Result Value Ref Range   pH, Arterial 7.451 (H) 7.35 - 7.45   pCO2 arterial 26.4 (L) 32 - 48 mmHg   pO2, Arterial 146 (H) 83 - 108 mmHg   Bicarbonate 18.4 (L) 20.0 - 28.0 mmol/L   TCO2 19 (L) 22 - 32 mmol/L   O2 Saturation 99 %   Acid-base deficit 4.0 (H) 0.0 - 2.0 mmol/L   Sodium 136 135 - 145 mmol/L   Potassium 4.0 3.5 - 5.1 mmol/L   Calcium, Ion 1.14 (L) 1.15 - 1.40 mmol/L   HCT 37.0 (L) 39.0 - 52.0 %   Hemoglobin 12.6 (L) 13.0 - 17.0 g/dL   Sample type ARTERIAL   Lactic acid, plasma     Status: None   Collection Time: 01/10/23 12:37 AM  Result Value Ref Range   Lactic Acid, Venous 1.7 0.5 - 1.9 mmol/L    Comment: Performed at [facility], [address] [place], [place] [address]  Protime-INR     Status: Abnormal    Collection Time: 01/10/23  3:03 AM  Result Value Ref Range   Prothrombin Time 16.7 (H) 11.4 - 15.2 seconds   INR 1.3 (H) 0.8 - 1.2    Comment: (NOTE) INR goal varies based on device and disease states. Performed at [facility], [address], [place], [place] [address]   Cortisol-am, blood     Status: Abnormal   Collection Time: 01/10/23  3:03 AM  Result Value Ref Range   Cortisol - AM 24.8 (H) 6.7 - 22.6 ug/dL    Comment: Performed at [facility], [address], [place], [place] [address]  Procalcitonin     Status: None   Collection Time: 01/10/23  3:03 AM  Result Value Ref Range   Procalcitonin 37.76 ng/mL    Comment:        Interpretation: PCT >= 10 ng/mL: Important systemic inflammatory response, almost exclusively due to severe bacterial sepsis or septic shock. (NOTE)       Sepsis PCT Algorithm           Lower Respiratory Tract                                      Infection PCT Algorithm    ----------------------------     ----------------------------         PCT < 0.25 ng/mL                PCT < 0.10 ng/mL          Strongly encourage             Strongly discourage   discontinuation of antibiotics    initiation of antibiotics    ----------------------------     -----------------------------       PCT  0.25 - 0.50 ng/mL            PCT 0.10 - 0.25 ng/mL               OR       >80% decrease in PCT            Discourage initiation of                                            antibiotics      Encourage discontinuation           of antibiotics    ----------------------------     -----------------------------         PCT >= 0.50 ng/mL              PCT 0.26 - 0.50 ng/mL                AND       <80% decrease in PCT             Encourage initiation of                                             antibiotics       Encourage continuation           of antibiotics    ----------------------------     -----------------------------        PCT >= 0.50 ng/mL                   PCT > 0.50 ng/mL               AND         increase in PCT                  Strongly encourage                                      initiation of antibiotics    Strongly encourage escalation           of antibiotics                                     -----------------------------                                           PCT <= 0.25 ng/mL                                                 OR                                        > 80% decrease in PCT  Discontinue / Do not initiate                                             antibiotics  Performed at [facility], [address] [place], [place] [address]   Comprehensive metabolic panel     Status: Abnormal   Collection Time: 01/10/23  3:03 AM  Result Value Ref Range   Sodium 135 135 - 145 mmol/L   Potassium 3.4 (L) 3.5 - 5.1 mmol/L   Chloride 103 98 - 111 mmol/L   CO2 20 (L) 22 - 32 mmol/L   Glucose, Bld 210 (H) 70 - 99 mg/dL    Comment: Glucose reference range applies only to samples taken after fasting for at least 8 hours.   BUN 21 8 - 23 mg/dL   Creatinine, Ser 4.78 0.61 - 1.24 mg/dL   Calcium 8.6 (L) 8.9 - 10.3 mg/dL   Total Protein 5.5 (L) 6.5 - 8.1 g/dL   Albumin 3.2 (L) 3.5 - 5.0 g/dL   AST 30 15 - 41 U/L   ALT 23 0 - 44 U/L   Alkaline Phosphatase 56 38 - 126 U/L   Total Bilirubin 0.9 0.3 - 1.2 mg/dL   GFR, Estimated >29 >56 mL/min    Comment: (NOTE) Calculated using the CKD-EPI Creatinine Equation (2021)    Anion gap 12 5 - 15    Comment: Performed at [facility] Lab, [address], [place], [place] [address]  CBC     Status: Abnormal   Collection Time: 01/10/23  3:03 AM  Result Value Ref Range   WBC 15.6 (H) 4.0 - 10.5 K/uL   RBC 3.86 (L) 4.22 - 5.81 MIL/uL   Hemoglobin 13.3 13.0 - 17.0 g/dL   HCT 65.7 (L) 84.6 - 96.2 %   MCV 94.0 80.0 - 100.0 fL   MCH 34.5 (H) 26.0 - 34.0 pg   MCHC 36.6 (H) 30.0 - 36.0 g/dL   RDW 95.2 84.1 - 32.4 %   Platelets 114 (L)  150 - 400 K/uL    Comment: Immature Platelet Fraction may be clinically indicated, consider ordering this additional test [id number] REPEATED TO VERIFY    nRBC 0.0 0.0 - 0.2 %    Comment: Performed at [facility], [address], [place], [place] [address]  Lactic acid, plasma     Status: None   Collection Time: 01/10/23  3:03 AM  Result Value Ref Range   Lactic Acid, Venous 1.4 0.5 - 1.9 mmol/L    Comment: Performed at [facility] Lab, [address] [place], [place] [address]  CBG monitoring, ED     Status: Abnormal   Collection Time: 01/10/23  3:29 AM  Result Value Ref Range   Glucose-Capillary 234 (H) 70 - 99 mg/dL    Comment: Glucose reference range applies only to samples taken after fasting for at least 8 hours.  Lactic acid, plasma     Status: None   Collection Time: 01/10/23  4:54 AM  Result Value Ref Range   Lactic Acid, Venous 1.7 0.5 - 1.9 mmol/L    Comment: Performed at [facility], [address] [place], [place] [address]  MRSA Next Gen by PCR, Nasal     Status: None   Collection Time: 01/10/23  6:09 AM   Specimen: Nasal Mucosa; Nasal Swab  Result Value Ref Range   MRSA by PCR Next Gen NOT DETECTED NOT DETECTED    Comment: (NOTE) The GeneXpert MRSA Assay (FDA approved for NASAL specimens only), is one component of a comprehensive MRSA colonization surveillance program. It is not intended to diagnose MRSA infection nor to guide or monitor treatment for MRSA infections. Test performance is not FDA approved in patients less than 8 years old. Performed at [facility], [address] [place], [place] [address]   CBG monitoring, ED     Status: Abnormal   Collection Time: 01/10/23  8:08 AM  Result Value Ref Range   Glucose-Capillary 186 (H) 70 - 99 mg/dL    Comment: Glucose reference range applies only to samples taken after fasting for at least 8 hours.   *Note: Due to a large number of results and/or encounters for the requested time  period, some results have not been displayed. A complete set of results can be found in Results Review.   DG CHEST PORT 1 VIEW  Result Date: 01/09/2023 CLINICAL DATA:  [id number] Acute hypoxic respiratory failure (HCC) [id number] EXAM: PORTABLE CHEST 1 VIEW COMPARISON:  Chest x-ray 10/09/2022 11:23 a.m. FINDINGS: Accessed right chest wall catheter with tip overlying the expected region super cavoatrial junction. Widened mediastinum with component likely due to AP portable technique. Persistent cardiomegaly. The heart and mediastinal contours are unchanged. Prominent hilar vasculature. Atherosclerotic plaque. No focal consolidation. Chronic coarsened markings. No pleural effusion. No pneumothorax. No acute osseous abnormality. IMPRESSION: 1. Widened mediastinum with component likely due to AP portable technique. Recommend repeat PA and lateral x-ray view of the chest for re-evaluation 2. Persistent cardiomegaly with underlying pericardial effusion not excluded. 3. Central venous congestion. 4. Aortic Atherosclerosis (ICD10-I70.0) and Emphysema (ICD10-J43.9). Electronically Signed   By: Tish Frederickson M.D.   On: 01/09/2023 23:40   CT ABDOMEN PELVIS W CONTRAST  Result Date: 01/09/2023 CLINICAL DATA:  Sepsis, abdominal pain, PE suspected, hypoxia, history of lung cancer * Tracking Code: BO * EXAM: CT ANGIOGRAPHY CHEST WITH CONTRAST CT ABDOMEN PELVIS WITH CONTRAST TECHNIQUE: Multidetector CT imaging of the chest was performed using the standard protocol during bolus administration of intravenous contrast. Multiplanar CT image reconstructions and MIPs were obtained to evaluate the vascular anatomy. Multidetector CT imaging of the abdomen and pelvis was performed using the standard protocol following bolus administration of intravenous contrast. RADIATION DOSE REDUCTION: This exam was performed according to the departmental dose-optimization program which includes automated exposure control, adjustment of the mA and/or  kV according to patient size and/or use of iterative reconstruction technique. CONTRAST:  75mL OMNIPAQUE IOHEXOL 350 MG/ML SOLN COMPARISON:  CT chest abdomen pelvis, 08/16/2022 FINDINGS: CT CHEST ANGIOGRAM FINDINGS Cardiovascular: Satisfactory opacification of the pulmonary arteries to the segmental level. No evidence of pulmonary embolism. Cardiomegaly. Three-vessel coronary artery calcifications. No pericardial effusion. Aortic atherosclerosis. Right chest port catheter. Mediastinum/Nodes: Interval enlargement of right paratracheal nodes measuring up to 2.4 x 1.2 cm, previously 1.8 x 0.7 cm (series 8, image 55). Interval enlargement of subcarinal nodes measuring up to 2.6 x 1.7 cm, previously 2.3 x 1.1 cm (series 8, image 89). Thyroid gland, trachea, and esophagus demonstrate no significant findings. Lungs/Pleura: Unchanged post treatment/postoperative appearance of the dependent left lower lobe with dense consolidation and fibrosis about a suture line (series 9, image 81). Severe emphysema. No pleural effusion or pneumothorax. Musculoskeletal: No chest wall abnormality. No acute osseous findings. Review of the MIP images confirms the above findings. CT ABDOMEN PELVIS FINDINGS Hepatobiliary: No  solid liver abnormality is seen. Gallstones. Gallbladder wall thickening, or biliary dilatation. Pancreas: Diffusely atrophic pancreas. No pancreatic ductal dilatation or surrounding inflammatory changes. Spleen: Normal in size without significant abnormality. Adrenals/Urinary Tract: Unchanged benign adenoma of the medial limb of the right adrenal gland requiring no specific further follow-up or characterization. (Series 5, image 29). Simple parapelvic and renal cortical cysts, for which no further follow-up or characterization is required. Kidneys are otherwise normal, without renal calculi, solid lesion, or hydronephrosis. Bladder is unremarkable. Stomach/Bowel: Stomach is within normal limits. Appendix appears normal. No  evidence of bowel wall thickening, distention, or inflammatory changes. Moderate burden of stool and stool balls throughout the colon and rectum. Vascular/Lymphatic: No significant vascular findings are present. No enlarged abdominal or pelvic lymph nodes. Reproductive: Mild prostatomegaly.  TURP defect. Other: Small fat containing epigastric hernia (series 5, image 54). Small fat containing bilateral inguinal hernias. No ascites. Musculoskeletal: No acute or significant osseous findings. IMPRESSION: 1. Negative examination for pulmonary embolism. 2. Unchanged post treatment/postoperative appearance of the dependent left lower lobe. 3. Interval enlargement of right paratracheal and subcarinal lymph nodes, nonspecific, possibly reactive although worrisome for nodal metastatic disease. These could be characterized for abnormal metabolic activity by PET-CT on a nonemergent, outpatient basis if desired. 4. No acute CT findings of the abdomen or pelvis. 5. Cholelithiasis without evidence of acute cholecystitis. 6. Severe emphysema. 7. Coronary artery disease. Aortic Atherosclerosis (ICD10-I70.0) and Emphysema (ICD10-J43.9). Electronically Signed   By: Jearld Lesch M.D.   On: 01/09/2023 13:01   CT Angio Chest PE W and/or Wo Contrast  Result Date: 01/09/2023 CLINICAL DATA:  Sepsis, abdominal pain, PE suspected, hypoxia, history of lung cancer * Tracking Code: BO * EXAM: CT ANGIOGRAPHY CHEST WITH CONTRAST CT ABDOMEN PELVIS WITH CONTRAST TECHNIQUE: Multidetector CT imaging of the chest was performed using the standard protocol during bolus administration of intravenous contrast. Multiplanar CT image reconstructions and MIPs were obtained to evaluate the vascular anatomy. Multidetector CT imaging of the abdomen and pelvis was performed using the standard protocol following bolus administration of intravenous contrast. RADIATION DOSE REDUCTION: This exam was performed according to the departmental dose-optimization  program which includes automated exposure control, adjustment of the mA and/or kV according to patient size and/or use of iterative reconstruction technique. CONTRAST:  75mL OMNIPAQUE IOHEXOL 350 MG/ML SOLN COMPARISON:  CT chest abdomen pelvis, 08/16/2022 FINDINGS: CT CHEST ANGIOGRAM FINDINGS Cardiovascular: Satisfactory opacification of the pulmonary arteries to the segmental level. No evidence of pulmonary embolism. Cardiomegaly. Three-vessel coronary artery calcifications. No pericardial effusion. Aortic atherosclerosis. Right chest port catheter. Mediastinum/Nodes: Interval enlargement of right paratracheal nodes measuring up to 2.4 x 1.2 cm, previously 1.8 x 0.7 cm (series 8, image 55). Interval enlargement of subcarinal nodes measuring up to 2.6 x 1.7 cm, previously 2.3 x 1.1 cm (series 8, image 89). Thyroid gland, trachea, and esophagus demonstrate no significant findings. Lungs/Pleura: Unchanged post treatment/postoperative appearance of the dependent left lower lobe with dense consolidation and fibrosis about a suture line (series 9, image 81). Severe emphysema. No pleural effusion or pneumothorax. Musculoskeletal: No chest wall abnormality. No acute osseous findings. Review of the MIP images confirms the above findings. CT ABDOMEN PELVIS FINDINGS Hepatobiliary: No solid liver abnormality is seen. Gallstones. Gallbladder wall thickening, or biliary dilatation. Pancreas: Diffusely atrophic pancreas. No pancreatic ductal dilatation or surrounding inflammatory changes. Spleen: Normal in size without significant abnormality. Adrenals/Urinary Tract: Unchanged benign adenoma of the medial limb of the right adrenal gland requiring no specific further follow-up or characterization. (  Series 5, image 29). Simple parapelvic and renal cortical cysts, for which no further follow-up or characterization is required. Kidneys are otherwise normal, without renal calculi, solid lesion, or hydronephrosis. Bladder is  unremarkable. Stomach/Bowel: Stomach is within normal limits. Appendix appears normal. No evidence of bowel wall thickening, distention, or inflammatory changes. Moderate burden of stool and stool balls throughout the colon and rectum. Vascular/Lymphatic: No significant vascular findings are present. No enlarged abdominal or pelvic lymph nodes. Reproductive: Mild prostatomegaly.  TURP defect. Other: Small fat containing epigastric hernia (series 5, image 54). Small fat containing bilateral inguinal hernias. No ascites. Musculoskeletal: No acute or significant osseous findings. IMPRESSION: 1. Negative examination for pulmonary embolism. 2. Unchanged post treatment/postoperative appearance of the dependent left lower lobe. 3. Interval enlargement of right paratracheal and subcarinal lymph nodes, nonspecific, possibly reactive although worrisome for nodal metastatic disease. These could be characterized for abnormal metabolic activity by PET-CT on a nonemergent, outpatient basis if desired. 4. No acute CT findings of the abdomen or pelvis. 5. Cholelithiasis without evidence of acute cholecystitis. 6. Severe emphysema. 7. Coronary artery disease. Aortic Atherosclerosis (ICD10-I70.0) and Emphysema (ICD10-J43.9). Electronically Signed   By: Jearld Lesch M.D.   On: 01/09/2023 13:01   DG Chest Portable 1 View  Result Date: 01/09/2023 CLINICAL DATA:  sepsis.  Shortness of breath.  Fall. EXAM: PORTABLE CHEST 1 VIEW COMPARISON:  04/04/2017. FINDINGS: Bilateral lungs appear hyperlucent with coarse bronchovascular markings, in keeping with COPD. There are probable atelectatic changes at the left lung base. Bilateral lungs otherwise appear clear. No dense consolidation or lung collapse. Subtle blunting of left lateral costophrenic angle may be due to trace pleural effusion versus overlying lung parenchymal opacities. Right lateral costophrenic angle is clear. Mildly enlarged cardio-mediastinal silhouette, which is  accentuated by AP technique. No acute osseous abnormalities. The soft tissues are within normal limits. Right-sided CT Port-A-Cath is seen with its tip overlying the midportion of superior vena cava. IMPRESSION: 1. COPD.  Probable atelectatic changes at the left lung base. 2. Subtle blunting of the left lateral costophrenic angle may be due to trace pleural effusion versus overlying lung parenchymal opacities. 3. Mild cardiomegaly. Electronically Signed   By: Jules Schick M.D.   On: 01/09/2023 12:18    Pending Labs Unresulted Labs (From admission, onward)     Start     Ordered   01/10/23 0801  Magnesium  Add-on,   AD        01/10/23 0800   01/09/23 2159  Blood gas, arterial  Once,   R        01/09/23 2159   01/09/23 1010  Blood culture (routine x 2)  BLOOD CULTURE X 2,   R (with STAT occurrences)      01/09/23 1011            Vitals/Pain Today's Vitals   01/10/23 0930 01/10/23 0947 01/10/23 1000 01/10/23 1025  BP: (!) 102/56  (!) 92/58   Pulse: 62  65 69  Resp: 17  16   Temp:  (!) 97.4 F (36.3 C)    TempSrc:  Oral    SpO2: 100%  100%   Weight:      Height:      PainSc:        Isolation Precautions No active isolations  Medications Medications  atorvastatin (LIPITOR) tablet 40 mg (40 mg Oral Given 01/10/23 1024)  diazepam (VALIUM) tablet 5 mg (has no administration in time range)  buPROPion (WELLBUTRIN XL) 24 hr tablet 150  mg (150 mg Oral Given 01/10/23 1025)  DULoxetine (CYMBALTA) DR capsule 60 mg (60 mg Oral Given 01/10/23 1025)  levothyroxine (SYNTHROID) tablet 175 mcg (175 mcg Oral Given 01/10/23 0610)  lipase/protease/amylase (CREON) capsule 36,000 Units (36,000 Units Oral Given 01/10/23 0909)  pantoprazole (PROTONIX) EC tablet 40 mg (40 mg Oral Given 01/10/23 1024)  tamsulosin (FLOMAX) capsule 0.4 mg (0.4 mg Oral Given 01/10/23 1025)  gabapentin (NEURONTIN) capsule 300 mg (300 mg Oral Given 01/09/23 2226)  arformoterol (BROVANA) nebulizer solution 15 mcg (15 mcg  Nebulization Given 01/10/23 0907)    And  umeclidinium bromide (INCRUSE ELLIPTA) 62.5 MCG/ACT 1 puff (1 puff Inhalation Given 01/10/23 0908)  lactated ringers infusion (150 mL/hr Intravenous New Bag/Given 01/10/23 0545)  sodium chloride flush (NS) 0.9 % injection 3 mL (3 mLs Intravenous Given 01/10/23 1027)  acetaminophen (TYLENOL) tablet 650 mg (650 mg Oral Given 01/10/23 0453)    Or  acetaminophen (TYLENOL) suppository 650 mg ( Rectal See Alternative 01/10/23 0453)  polyethylene glycol (MIRALAX / GLYCOLAX) packet 17 g (has no administration in time range)  insulin aspart (novoLOG) injection 0-9 Units (2 Units Subcutaneous Given 01/10/23 0907)  apixaban (ELIQUIS) tablet 5 mg (5 mg Oral Given 01/10/23 1025)  sorbitol 70 % solution 30 mL (30 mLs Oral Not Given 01/09/23 1822)    Followed by  sorbitol 70 % solution 30 mL (has no administration in time range)  sodium phosphate (FLEET) enema 1 enema (has no administration in time range)  bisoprolol (ZEBETA) tablet 5 mg (5 mg Oral Given 01/10/23 1024)  digoxin (LANOXIN) tablet 0.125 mg (0.125 mg Oral Given 01/10/23 1025)  amiodarone (NEXTERONE) 1.8 mg/mL load via infusion 150 mg (150 mg Intravenous Bolus from Bag 01/09/23 2247)    Followed by  amiodarone (NEXTERONE PREMIX) 360-4.14 MG/200ML-% (1.8 mg/mL) IV infusion (0 mg/hr Intravenous Stopped 01/10/23 0444)    Followed by  amiodarone (NEXTERONE PREMIX) 360-4.14 MG/200ML-% (1.8 mg/mL) IV infusion (30 mg/hr Intravenous New Bag/Given 01/10/23 0445)  levalbuterol (XOPENEX) nebulizer solution 0.63 mg (has no administration in time range)  vancomycin (VANCOCIN) IVPB 1000 mg/200 mL premix (has no administration in time range)  ceFEPIme (MAXIPIME) 2 g in sodium chloride 0.9 % 100 mL IVPB (0 g Intravenous Stopped 01/10/23 1027)  lactated ringers bolus 1,000 mL (0 mLs Intravenous Stopped 01/09/23 1144)  ceFEPIme (MAXIPIME) 2 g in sodium chloride 0.9 % 100 mL IVPB (0 g Intravenous Stopped 01/09/23 1114)   metroNIDAZOLE (FLAGYL) IVPB 500 mg (0 mg Intravenous Stopped 01/09/23 1143)  acetaminophen (TYLENOL) tablet 1,000 mg (1,000 mg Oral Given 01/09/23 1040)  vancomycin (VANCOREADY) IVPB 2000 mg/400 mL (0 mg Intravenous Stopped 01/09/23 1350)  diltiazem (CARDIZEM) 25 MG/5ML injection (15 mg  Given 01/09/23 1117)  iohexol (OMNIPAQUE) 350 MG/ML injection 75 mL (75 mLs Intravenous Contrast Given 01/09/23 1216)  lactated ringers bolus 500 mL (0 mLs Intravenous Stopped 01/09/23 1613)  magnesium sulfate IVPB 2 g 50 mL (0 g Intravenous Stopped 01/09/23 1617)  metoprolol tartrate (LOPRESSOR) injection 2.5 mg (2.5 mg Intravenous Given 01/09/23 2219)  lactated ringers bolus 500 mL (0 mLs Intravenous Stopped 01/09/23 2350)  furosemide (LASIX) injection 20 mg (20 mg Intravenous Given 01/09/23 2345)    Mobility Walks with person assist     Focused Assessments Pulmonary Assessment Handoff:  Lung sounds: Bilateral Breath Sounds: Diminished O2 Device: Nasal Cannula O2 Flow Rate (L/min): 3 L/min    R Recommendations: See Admitting Provider Note  Report given to:   Additional Notes:

## 2023-01-10 NOTE — Care Management (Signed)
Transition of Care [facility]) - Inpatient Brief Assessment   Patient Details  Name: Victor Huber MRN: [id number] Date of Birth: Jul 18, 1950  Transition of Care [facility]) CM/SW Contact:    Lockie Pares, RN Phone Number: 01/10/2023, 3:02 PM   Clinical Narrative:  72 yo history of Lung ca, port, urinary issues, foley just DC, CHF with EF 40-45% presents with fever and sepsis. He lives at home with wife. No needs anticipated at this time. Please place a TOC consult if needs identified.  Transition of Care Asessment: Insurance and Status: Insurance coverage has been reviewed Patient has primary care physician: Yes Home environment has been reviewed: yes Prior level of function:: Independent Prior/Current Home Services: No current home services Social Determinants of Health Reivew: SDOH reviewed no interventions necessary Readmission risk has been reviewed: Yes Transition of care needs: no transition of care needs at this time

## 2023-01-10 NOTE — ED Notes (Signed)
Pt is resting, has been sweating, is comfortable at the moment.

## 2023-01-10 NOTE — Progress Notes (Addendum)
  Acute cystitis associated with hematuria: Patient's bedside nurse reported rim of blood around his urinal.  Per chart review patient has history of paroxysmal A-fib on Eliquis.   Patient has been admitted for sepsis secondary to UTI and of A-fib RVR earlier this morning which has been subsided with Cardizem and amiodarone drip.  Currently he is on Eliquis 5 mg twice daily. -UA from morning showing dipstick hemoglobin positive and RBC 11-20. -Rechecking UA and following up with H&H. -As benefit overweight risk I am planning to continue Eliquis for the management of atrial fibrillation to prevent stroke.  However if patient continues to have frequent hematuria or hemoglobin continues to drop significantly in that case we will decide to hold Eliquis.  Sepsis secondary to UTI - Patient's blood pressure is low 91/54.  Initially he has been resuscitated with 2.4 L of IV fluid and then continued LR 150 cc/h throughout the day which has been expired 4 hours ago. - Reinitiating LR 100 cc/h for next 24 hours and encouraging oral hydration. -Currently on broad-spectrum antibiotic coverage with Vanco and cefepime.  Pending blood culture results and urine culture results.  Tereasa Coop, MD [facility] 01/10/2023, 8:59 PM

## 2023-01-10 NOTE — ED Notes (Signed)
IP provider at bedside.

## 2023-01-10 NOTE — ED Notes (Signed)
Pt starting to relax, hr responding to amiodarone, and breathing has evened out put on 4 ltrs.

## 2023-01-10 NOTE — ED Notes (Signed)
Pt started with rigors, temperature spiked to 103.8, Md notified, multiple medications given.

## 2023-01-10 NOTE — Inpatient Diabetes Management (Signed)
Inpatient Diabetes Program Recommendations  AACE/ADA: New Consensus Statement on Inpatient Glycemic Control (2015)  Target Ranges:  Prepandial:   less than 140 mg/dL      Peak postprandial:   less than 180 mg/dL (1-2 hours)      Critically ill patients:  140 - 180 mg/dL   Lab Results  Component Value Date   GLUCAP 181 (H) 01/10/2023   HGBA1C 8.7 (A) 11/06/2022    Review of Glycemic Control  Latest Reference Range & Units 01/09/23 16:29 01/10/23 03:29 01/10/23 08:08 01/10/23 12:06  Glucose-Capillary 70 - 99 mg/dL 295 (H) 621 (H) 308 (H) 181 (H)   Diabetes history: DM 2 Outpatient Diabetes medications:  Synjardy 5-500 mg bid  Current orders for Inpatient glycemic control:  Novolog sensitive tid with meals   Inpatient Diabetes Program Recommendations:    Note that patient admitted with UTI.  He was on Synjardy which does have SGLT-2 which can increase risk for UTI?  May need adjustment in this medication or possibly another oral agent or insulin? Consider adding Semglee 10 units daily while in the hospital.   Thanks,  Beryl Meager, RN, BC-ADM Inpatient Diabetes Coordinator Pager [phone]  (8a-5p)

## 2023-01-10 NOTE — Progress Notes (Signed)
Progress Note   Patient: Victor Huber WGN:[id number] DOB: 29-Sep-1950 DOA: 01/09/2023     1 DOS: the patient was seen and examined on 01/10/2023   Brief hospital course: Victor Huber is a 72 y.o. male with medical history significant of hyperlipidemia, diabetes, atrial fibrillation, hypothyroidism, CHF, BPH, lung cancer, anxiety, depression, COPD, chronic pancreatitis presenting after episode of shortness of breath and weakness at home. Had recent TURP and had Foley catheter removed yesterday since then had increased urinary frequency.  Fever of 101, had fall, slid from bed.  EMS was called and patient noted to be hypoxic at around 80% on room air with atrial fibrillation at a rate of 208 bpm.  He was given some IV diltiazem with improvement of rate to the 140s.  Brought to the ED where he got vancomycin, cefepime, Flagyl, started on diltiazem infusion and received Tylenol and a liter of fluids.   Patient is started on Amiodarone gtt admitted to hospitalist service for further management evaluation of sepsis secondary to UTI, A-fib, acute kidney injury.  Assessment and Plan: Sepsis secondary to UTI Meets sepsis criteria given fever to 104, tachycardia, tachypnea, leukocytosis to 12.6. Urinalysis abnormal. No evidence of infection on CT chest abdomen or pelvis. Continue vancomycin, cefepime. Did receive IV fluids in ED. BP lower side, HR improved. Continue to monitor vitals closely. Gentle IV hydration.  Encourage oral fluids.   Atrial fibrillation with RVR In the setting of sepsis. HR improved. Will taper off IV amiodarone infusion. Continue digoxin, home bisoprolol. Restarted eliquis.   AKI Creatinine elevated to 1.39 from baseline 0.9. Continue IV hydration. Monitor daily renal function. Avoid nephrotoxins.  Hypomagnesemia Magnesium replaced.   Hyperlipidemia Continue atorvastatin   Diabetes Accucheks, sliding scale insulin. Sugars high, started semglee 10 units  daily. He is on Synjardy at home which can increase risk of UTI.   Hypothyroidism Continue home Synthroid   Chronic combined systolic CHF Last echo showed EF 40-45%, indeterminate diastolic function, mildly reduced RV function. Not currently on diuretic. Continue home digoxin, bisoprolol IV fluids for sepsis will be stopped. Caution with fluid overload. Monitor renal function and electrolytes Strict I's and O's, daily weights   BPH Status post TURP Foley catheter removed 2 days ago. Continue home Flomax   H/o Lung cancer Non-small cell lung cancer metastatic to bone with lymphadenopathy.  Status post wedge resection of left lower lobe mass and lymph node dissection.  Status post chemoradiation.  Status post further palliative radiotherapy of metastatic bone disease.  Had been on Keytruda, this was held since January 2022. Possibly reactive lymphadenopathy in the setting of sepsis, nonemergent PET follow-up recommended. Outpatient oncology follow up.   Anxiety Depression Continue home Wellbutrin, duloxetine, as needed Valium   COPD formulary Brovana and Incruse As needed albuterol   Chronic pancreatitis Resume Creon        Out of bed to chair. Incentive spirometry. PT evaluation. Fall, aspiration precautions. DVT prophylaxis   Code Status: Full Code  Subjective: Patient is seen and examined today morning. He is lyin in bed, does have dyuria since 2 days. Feels weak, appetite better. Did not get out of bed.  Physical Exam: Vitals:   01/10/23 1000 01/10/23 1025 01/10/23 1100 01/10/23 1209  BP: (!) 92/58  96/60 (!) 108/59  Pulse: 65 69 (!) 54 64  Resp: 16  17 17   Temp:    (!) 97.5 F (36.4 C)  TempSrc:    Oral  SpO2: 100%  100% 96%  Weight:  Height:        General - Elderly Caucasian male, no apparent distress HEENT - PERRLA, EOMI, atraumatic head, non tender sinuses. Lung - Clear, diffuse rhonchi, no wheezes. Heart - S1, S2 heard, no murmurs, rubs,  trace pedal edema. Abdomen - Soft, non tender, distended, bowel sounds good Neuro - Alert, awake and oriented x 3, non focal exam. Skin - Warm and dry.  Data Reviewed:      Latest Ref Rng & Units 01/10/2023    3:03 AM 01/09/2023   10:23 PM 01/09/2023   11:01 AM  CBC  WBC 4.0 - 10.5 K/uL 15.6     Hemoglobin 13.0 - 17.0 g/dL 16.1  09.6  04.5   Hematocrit 39.0 - 52.0 % 36.3  37.0  47.0   Platelets 150 - 400 K/uL 114         Latest Ref Rng & Units 01/10/2023    3:03 AM 01/09/2023   10:23 PM 01/09/2023   11:01 AM  BMP  Glucose 70 - 99 mg/dL 409     BUN 8 - 23 mg/dL 21     Creatinine 8.11 - 1.24 mg/dL 9.14     Sodium 782 - 956 mmol/L 135  136  140   Potassium 3.5 - 5.1 mmol/L 3.4  4.0  4.4   Chloride 98 - 111 mmol/L 103     CO2 22 - 32 mmol/L 20     Calcium 8.9 - 10.3 mg/dL 8.6      DG CHEST PORT 1 VIEW  Result Date: 01/09/2023 CLINICAL DATA:  [id number] Acute hypoxic respiratory failure (HCC) [id number] EXAM: PORTABLE CHEST 1 VIEW COMPARISON:  Chest x-ray 10/09/2022 11:23 a.m. FINDINGS: Accessed right chest wall catheter with tip overlying the expected region super cavoatrial junction. Widened mediastinum with component likely due to AP portable technique. Persistent cardiomegaly. The heart and mediastinal contours are unchanged. Prominent hilar vasculature. Atherosclerotic plaque. No focal consolidation. Chronic coarsened markings. No pleural effusion. No pneumothorax. No acute osseous abnormality. IMPRESSION: 1. Widened mediastinum with component likely due to AP portable technique. Recommend repeat PA and lateral x-ray view of the chest for re-evaluation 2. Persistent cardiomegaly with underlying pericardial effusion not excluded. 3. Central venous congestion. 4. Aortic Atherosclerosis (ICD10-I70.0) and Emphysema (ICD10-J43.9). Electronically Signed   By: Tish Frederickson M.D.   On: 01/09/2023 23:40   CT ABDOMEN PELVIS W CONTRAST  Result Date: 01/09/2023 CLINICAL DATA:  Sepsis, abdominal  pain, PE suspected, hypoxia, history of lung cancer * Tracking Code: BO * EXAM: CT ANGIOGRAPHY CHEST WITH CONTRAST CT ABDOMEN PELVIS WITH CONTRAST TECHNIQUE: Multidetector CT imaging of the chest was performed using the standard protocol during bolus administration of intravenous contrast. Multiplanar CT image reconstructions and MIPs were obtained to evaluate the vascular anatomy. Multidetector CT imaging of the abdomen and pelvis was performed using the standard protocol following bolus administration of intravenous contrast. RADIATION DOSE REDUCTION: This exam was performed according to the departmental dose-optimization program which includes automated exposure control, adjustment of the mA and/or kV according to patient size and/or use of iterative reconstruction technique. CONTRAST:  75mL OMNIPAQUE IOHEXOL 350 MG/ML SOLN COMPARISON:  CT chest abdomen pelvis, 08/16/2022 FINDINGS: CT CHEST ANGIOGRAM FINDINGS Cardiovascular: Satisfactory opacification of the pulmonary arteries to the segmental level. No evidence of pulmonary embolism. Cardiomegaly. Three-vessel coronary artery calcifications. No pericardial effusion. Aortic atherosclerosis. Right chest port catheter. Mediastinum/Nodes: Interval enlargement of right paratracheal nodes measuring up to 2.4 x 1.2 cm, previously 1.8 x 0.7 cm (series 8, image  55). Interval enlargement of subcarinal nodes measuring up to 2.6 x 1.7 cm, previously 2.3 x 1.1 cm (series 8, image 89). Thyroid gland, trachea, and esophagus demonstrate no significant findings. Lungs/Pleura: Unchanged post treatment/postoperative appearance of the dependent left lower lobe with dense consolidation and fibrosis about a suture line (series 9, image 81). Severe emphysema. No pleural effusion or pneumothorax. Musculoskeletal: No chest wall abnormality. No acute osseous findings. Review of the MIP images confirms the above findings. CT ABDOMEN PELVIS FINDINGS Hepatobiliary: No solid liver  abnormality is seen. Gallstones. Gallbladder wall thickening, or biliary dilatation. Pancreas: Diffusely atrophic pancreas. No pancreatic ductal dilatation or surrounding inflammatory changes. Spleen: Normal in size without significant abnormality. Adrenals/Urinary Tract: Unchanged benign adenoma of the medial limb of the right adrenal gland requiring no specific further follow-up or characterization. (Series 5, image 29). Simple parapelvic and renal cortical cysts, for which no further follow-up or characterization is required. Kidneys are otherwise normal, without renal calculi, solid lesion, or hydronephrosis. Bladder is unremarkable. Stomach/Bowel: Stomach is within normal limits. Appendix appears normal. No evidence of bowel wall thickening, distention, or inflammatory changes. Moderate burden of stool and stool balls throughout the colon and rectum. Vascular/Lymphatic: No significant vascular findings are present. No enlarged abdominal or pelvic lymph nodes. Reproductive: Mild prostatomegaly.  TURP defect. Other: Small fat containing epigastric hernia (series 5, image 54). Small fat containing bilateral inguinal hernias. No ascites. Musculoskeletal: No acute or significant osseous findings. IMPRESSION: 1. Negative examination for pulmonary embolism. 2. Unchanged post treatment/postoperative appearance of the dependent left lower lobe. 3. Interval enlargement of right paratracheal and subcarinal lymph nodes, nonspecific, possibly reactive although worrisome for nodal metastatic disease. These could be characterized for abnormal metabolic activity by PET-CT on a nonemergent, outpatient basis if desired. 4. No acute CT findings of the abdomen or pelvis. 5. Cholelithiasis without evidence of acute cholecystitis. 6. Severe emphysema. 7. Coronary artery disease. Aortic Atherosclerosis (ICD10-I70.0) and Emphysema (ICD10-J43.9). Electronically Signed   By: Jearld Lesch M.D.   On: 01/09/2023 13:01   CT Angio Chest  PE W and/or Wo Contrast  Result Date: 01/09/2023 CLINICAL DATA:  Sepsis, abdominal pain, PE suspected, hypoxia, history of lung cancer * Tracking Code: BO * EXAM: CT ANGIOGRAPHY CHEST WITH CONTRAST CT ABDOMEN PELVIS WITH CONTRAST TECHNIQUE: Multidetector CT imaging of the chest was performed using the standard protocol during bolus administration of intravenous contrast. Multiplanar CT image reconstructions and MIPs were obtained to evaluate the vascular anatomy. Multidetector CT imaging of the abdomen and pelvis was performed using the standard protocol following bolus administration of intravenous contrast. RADIATION DOSE REDUCTION: This exam was performed according to the departmental dose-optimization program which includes automated exposure control, adjustment of the mA and/or kV according to patient size and/or use of iterative reconstruction technique. CONTRAST:  75mL OMNIPAQUE IOHEXOL 350 MG/ML SOLN COMPARISON:  CT chest abdomen pelvis, 08/16/2022 FINDINGS: CT CHEST ANGIOGRAM FINDINGS Cardiovascular: Satisfactory opacification of the pulmonary arteries to the segmental level. No evidence of pulmonary embolism. Cardiomegaly. Three-vessel coronary artery calcifications. No pericardial effusion. Aortic atherosclerosis. Right chest port catheter. Mediastinum/Nodes: Interval enlargement of right paratracheal nodes measuring up to 2.4 x 1.2 cm, previously 1.8 x 0.7 cm (series 8, image 55). Interval enlargement of subcarinal nodes measuring up to 2.6 x 1.7 cm, previously 2.3 x 1.1 cm (series 8, image 89). Thyroid gland, trachea, and esophagus demonstrate no significant findings. Lungs/Pleura: Unchanged post treatment/postoperative appearance of the dependent left lower lobe with dense consolidation and fibrosis about a suture line (series  9, image 81). Severe emphysema. No pleural effusion or pneumothorax. Musculoskeletal: No chest wall abnormality. No acute osseous findings. Review of the MIP images confirms  the above findings. CT ABDOMEN PELVIS FINDINGS Hepatobiliary: No solid liver abnormality is seen. Gallstones. Gallbladder wall thickening, or biliary dilatation. Pancreas: Diffusely atrophic pancreas. No pancreatic ductal dilatation or surrounding inflammatory changes. Spleen: Normal in size without significant abnormality. Adrenals/Urinary Tract: Unchanged benign adenoma of the medial limb of the right adrenal gland requiring no specific further follow-up or characterization. (Series 5, image 29). Simple parapelvic and renal cortical cysts, for which no further follow-up or characterization is required. Kidneys are otherwise normal, without renal calculi, solid lesion, or hydronephrosis. Bladder is unremarkable. Stomach/Bowel: Stomach is within normal limits. Appendix appears normal. No evidence of bowel wall thickening, distention, or inflammatory changes. Moderate burden of stool and stool balls throughout the colon and rectum. Vascular/Lymphatic: No significant vascular findings are present. No enlarged abdominal or pelvic lymph nodes. Reproductive: Mild prostatomegaly.  TURP defect. Other: Small fat containing epigastric hernia (series 5, image 54). Small fat containing bilateral inguinal hernias. No ascites. Musculoskeletal: No acute or significant osseous findings. IMPRESSION: 1. Negative examination for pulmonary embolism. 2. Unchanged post treatment/postoperative appearance of the dependent left lower lobe. 3. Interval enlargement of right paratracheal and subcarinal lymph nodes, nonspecific, possibly reactive although worrisome for nodal metastatic disease. These could be characterized for abnormal metabolic activity by PET-CT on a nonemergent, outpatient basis if desired. 4. No acute CT findings of the abdomen or pelvis. 5. Cholelithiasis without evidence of acute cholecystitis. 6. Severe emphysema. 7. Coronary artery disease. Aortic Atherosclerosis (ICD10-I70.0) and Emphysema (ICD10-J43.9).  Electronically Signed   By: Jearld Lesch M.D.   On: 01/09/2023 13:01   DG Chest Portable 1 View  Result Date: 01/09/2023 CLINICAL DATA:  sepsis.  Shortness of breath.  Fall. EXAM: PORTABLE CHEST 1 VIEW COMPARISON:  04/04/2017. FINDINGS: Bilateral lungs appear hyperlucent with coarse bronchovascular markings, in keeping with COPD. There are probable atelectatic changes at the left lung base. Bilateral lungs otherwise appear clear. No dense consolidation or lung collapse. Subtle blunting of left lateral costophrenic angle may be due to trace pleural effusion versus overlying lung parenchymal opacities. Right lateral costophrenic angle is clear. Mildly enlarged cardio-mediastinal silhouette, which is accentuated by AP technique. No acute osseous abnormalities. The soft tissues are within normal limits. Right-sided CT Port-A-Cath is seen with its tip overlying the midportion of superior vena cava. IMPRESSION: 1. COPD.  Probable atelectatic changes at the left lung base. 2. Subtle blunting of the left lateral costophrenic angle may be due to trace pleural effusion versus overlying lung parenchymal opacities. 3. Mild cardiomegaly. Electronically Signed   By: Jules Schick M.D.   On: 01/09/2023 12:18      Disposition: Status is: Inpatient Remains inpatient appropriate because: sepsis, uti, afib with RVR  Planned Discharge Destination: Home with Home Health     MDM level 3- Patient presented with sepsis, afib with RVR requiring IV hydration, amiodarone drip. He will need close hemodynamic, telemetry, neurologic monitoring. He is at high risk for sudden clinical deterioration.  Author: Marcelino Duster, MD 01/10/2023 1:37 PM Secure chat 7am to 7pm For on call review [email or web address].

## 2023-01-11 ENCOUNTER — Encounter (HOSPITAL_COMMUNITY): Payer: Self-pay | Admitting: Internal Medicine

## 2023-01-11 DIAGNOSIS — N390 Urinary tract infection, site not specified: Secondary | ICD-10-CM | POA: Diagnosis not present

## 2023-01-11 DIAGNOSIS — A419 Sepsis, unspecified organism: Secondary | ICD-10-CM | POA: Diagnosis not present

## 2023-01-11 LAB — BLOOD CULTURE ID PANEL (REFLEXED) - BCID2

## 2023-01-11 LAB — BASIC METABOLIC PANEL
Anion gap: 8 (ref 5–15)
BUN: 19 mg/dL (ref 8–23)
CO2: 24 mmol/L (ref 22–32)
Calcium: 8.1 mg/dL — ABNORMAL LOW (ref 8.9–10.3)
Chloride: 105 mmol/L (ref 98–111)
Creatinine, Ser: 0.99 mg/dL (ref 0.61–1.24)
GFR, Estimated: >60 mL/min (ref >60)
Glucose, Bld: 166 mg/dL — ABNORMAL HIGH (ref 70–99)
Potassium: 3.7 mmol/L (ref 3.5–5.1)
Sodium: 137 mmol/L (ref 135–145)

## 2023-01-11 LAB — GLUCOSE, CAPILLARY
Glucose-Capillary: 156 mg/dL — ABNORMAL HIGH (ref 70–99)
Glucose-Capillary: 286 mg/dL — ABNORMAL HIGH (ref 70–99)
Glucose-Capillary: 278 mg/dL — ABNORMAL HIGH (ref 70–99)
Glucose-Capillary: 204 mg/dL — ABNORMAL HIGH (ref 70–99)

## 2023-01-11 LAB — PROCALCITONIN: Procalcitonin: 33.49 ng/mL

## 2023-01-11 LAB — CBC
HCT: 36.5 % — ABNORMAL LOW (ref 39.0–52.0)
Hemoglobin: 12.5 g/dL — ABNORMAL LOW (ref 13.0–17.0)
MCH: 31.6 pg (ref 26.0–34.0)
MCHC: 34.2 g/dL (ref 30.0–36.0)
MCV: 92.2 fL (ref 80.0–100.0)
Platelets: 112 10*3/uL — ABNORMAL LOW (ref 150–400)
RBC: 3.96 MIL/uL — ABNORMAL LOW (ref 4.22–5.81)
RDW: 13.8 % (ref 11.5–15.5)
WBC: 8.8 10*3/uL (ref 4.0–10.5)
nRBC: 0 % (ref 0.0–0.2)

## 2023-01-11 LAB — URINALYSIS, ROUTINE W REFLEX MICROSCOPIC
Bacteria, UA: NONE SEEN
Bilirubin Urine: NEGATIVE
Glucose, UA: >=500 mg/dL — AB
Ketones, ur: NEGATIVE mg/dL
Nitrite: NEGATIVE
Protein, ur: NEGATIVE mg/dL
Specific Gravity, Urine: 1.006 (ref 1.005–1.030)
pH: 6 (ref 5.0–8.0)

## 2023-01-11 LAB — URINE CULTURE: Culture: >=100000 — AB

## 2023-01-11 LAB — C-REACTIVE PROTEIN: CRP: 11.2 mg/dL — ABNORMAL HIGH (ref <1.0)

## 2023-01-11 MED ORDER — SODIUM CHLORIDE 0.9 % IV SOLN
3.0000 g | Freq: Four times a day (QID) | INTRAVENOUS | Status: DC
  Administered 2023-01-11 – 2023-01-13 (×8): 3 g via INTRAVENOUS
  Filled 2023-01-11 (×8): qty 8

## 2023-01-11 MED ORDER — MIDODRINE HCL 5 MG PO TABS
10.0000 mg | ORAL_TABLET | Freq: Three times a day (TID) | ORAL | Status: DC
  Administered 2023-01-11 (×3): 10 mg via ORAL
  Filled 2023-01-11 (×3): qty 2

## 2023-01-11 NOTE — Evaluation (Signed)
Physical Therapy Evaluation Patient Details Name: Victor Huber MRN: [id number] DOB: 26-May-1950 Today's Date: 01/11/2023  History of Present Illness  Pt is a 72 y/o M admitted on 01/09/23 after presenting with c/o SOB & weakness. Pt is being treated for sepsis 2/2 UTI. PMH: HLD, DM, a-fib, hypothyroidism, CHF, BPH, lung CA, anxiety, depression, COPD, chronic pancreatitis  Clinical Impression  Pt seen for PT evaluation with pt agreeable, wife present but exiting shortly after PT arrival. Prior to admission pt was independent without AD, living in a 1 level town home with level entry. On this date, pt is able to ambulate in room/bathroom without AD with mod I, no overt LOB. Pt toilets without assistance. Pt reports he feels close to his baseline, aside from the slight weakness 2/2 lying in bed since admission. Pt reports he is limited by SOB at baseline but if he pushes it, he can walk around the block at home. PT educates pt on pursed lip breathing, SpO2 >/=90% when good pleth waveform reading. Pt declines further mobility at this time. Do not feel pt requires formal PT services while in acute setting but recommend pt continue mobilizing with nursing staff, family, and/or mobility specialists. PT to complete current orders at this time, please re-consult if new needs arise.        If plan is discharge home, recommend the following: Assistance with cooking/housework;Assist for transportation   Can travel by private vehicle        Equipment Recommendations None recommended by PT  Recommendations for Other Services       Functional Status Assessment Patient has not had a recent decline in their functional status     Precautions / Restrictions Precautions Precautions: None Restrictions Weight Bearing Restrictions: No      Mobility  Bed Mobility Overal bed mobility: Modified Independent Bed Mobility: Supine to Sit     Supine to sit: Modified independent (Device/Increase time), HOB  elevated, Used rails          Transfers Overall transfer level: Needs assistance Equipment used: None Transfers: Sit to/from Stand Sit to Stand: Independent           General transfer comment: STS from EOB, toilet in bathroom    Ambulation/Gait Ambulation/Gait assistance: Modified independent (Device/Increase time) Gait Distance (Feet): 10 Feet (+ 10 ft + 5 ft) Assistive device: None Gait Pattern/deviations: Decreased stride length, Decreased step length - right, Decreased step length - left Gait velocity: decreased        Stairs            Wheelchair Mobility     Tilt Bed    Modified Rankin (Stroke Patients Only)       Balance Overall balance assessment: Mild deficits observed, not formally tested   Sitting balance-Leahy Scale: Good     Standing balance support: During functional activity, No upper extremity supported Standing balance-Leahy Scale: Good                               Pertinent Vitals/Pain Pain Assessment Pain Assessment: No/denies pain    Home Living Family/patient expects to be discharged to:: Private residence Living Arrangements: Spouse/significant other Available Help at Discharge: Family Type of Home: House (townhome) Home Access: Level entry       Home Layout: One level        Prior Function Prior Level of Function : Independent/Modified Independent  Mobility Comments: only the 1 fall leading to admission in the past 6 months       Extremity/Trunk Assessment   Upper Extremity Assessment Upper Extremity Assessment: Overall WFL for tasks assessed    Lower Extremity Assessment Lower Extremity Assessment: Overall WFL for tasks assessed       Communication      Cognition Arousal: Alert Behavior During Therapy: WFL for tasks assessed/performed Overall Cognitive Status: Within Functional Limits for tasks assessed                                           General Comments General comments (skin integrity, edema, etc.): Pt toileted without assistance.    Exercises     Assessment/Plan    PT Assessment Patient does not need any further PT services  PT Problem List         PT Treatment Interventions      PT Goals (Current goals can be found in the Care Plan section)  Acute Rehab PT Goals Patient Stated Goal: get better, go home PT Goal Formulation: With patient Time For Goal Achievement: 01/25/23 Potential to Achieve Goals: Good    Frequency       Co-evaluation               AM-PAC PT "6 Clicks" Mobility  Outcome Measure Help needed turning from your back to your side while in a flat bed without using bedrails?: None Help needed moving from lying on your back to sitting on the side of a flat bed without using bedrails?: None Help needed moving to and from a bed to a chair (including a wheelchair)?: None Help needed standing up from a chair using your arms (e.g., wheelchair or bedside chair)?: None Help needed to walk in hospital room?: None Help needed climbing 3-5 steps with a railing? : None 6 Click Score: 24    End of Session   Activity Tolerance: Patient tolerated treatment well (limited by SOB) Patient left: in chair;with chair alarm set;with call bell/phone within reach Nurse Communication: Mobility status      Time: 9147-8295 PT Time Calculation (min) (ACUTE ONLY): 22 min   Charges:   PT Evaluation $PT Eval Low Complexity: 1 Low   PT General Charges $$ ACUTE PT VISIT: 1 Visit         Aleda Grana, PT, DPT 01/11/23, 11:14 AM   Sandi Mariscal 01/11/2023, 11:11 AM

## 2023-01-11 NOTE — Plan of Care (Signed)
Pt has rested quietly throughout the night with no distress noted. Alert and oriented. On room air. Afib on the monitor. Voids per urinal. Medicated for pain with relief noted. Pt had blood in urine at beginning of shift when voiding. MD notified. Pt refused eliquis because of bleeding. UA sent to lab. IVF's started. H&H done. Wife at bedside. No other complaints voiced.    Problem: Fluid Volume: Goal: Hemodynamic stability will improve Outcome: Progressing   Problem: Clinical Measurements: Goal: Diagnostic test results will improve Outcome: Progressing Goal: Signs and symptoms of infection will decrease Outcome: Progressing   Problem: Respiratory: Goal: Ability to maintain adequate ventilation will improve Outcome: Progressing   Problem: Education: Goal: Ability to describe self-care measures that may prevent or decrease complications (Diabetes Survival Skills Education) will improve Outcome: Progressing Goal: Individualized Educational Video(s) Outcome: Progressing   Problem: Coping: Goal: Ability to adjust to condition or change in health will improve Outcome: Progressing   Problem: Fluid Volume: Goal: Ability to maintain a balanced intake and output will improve Outcome: Progressing   Problem: Health Behavior/Discharge Planning: Goal: Ability to identify and utilize available resources and services will improve Outcome: Progressing Goal: Ability to manage health-related needs will improve Outcome: Progressing   Problem: Metabolic: Goal: Ability to maintain appropriate glucose levels will improve Outcome: Progressing   Problem: Nutritional: Goal: Maintenance of adequate nutrition will improve Outcome: Progressing Goal: Progress toward achieving an optimal weight will improve Outcome: Progressing   Problem: Skin Integrity: Goal: Risk for impaired skin integrity will decrease Outcome: Progressing   Problem: Tissue Perfusion: Goal: Adequacy of tissue perfusion  will improve Outcome: Progressing   Problem: Education: Goal: Knowledge of General Education information will improve Description: Including pain rating scale, medication(s)/side effects and non-pharmacologic comfort measures Outcome: Progressing   Problem: Health Behavior/Discharge Planning: Goal: Ability to manage health-related needs will improve Outcome: Progressing   Problem: Clinical Measurements: Goal: Ability to maintain clinical measurements within normal limits will improve Outcome: Progressing Goal: Will remain free from infection Outcome: Progressing Goal: Diagnostic test results will improve Outcome: Progressing Goal: Respiratory complications will improve Outcome: Progressing Goal: Cardiovascular complication will be avoided Outcome: Progressing   Problem: Activity: Goal: Risk for activity intolerance will decrease Outcome: Progressing   Problem: Nutrition: Goal: Adequate nutrition will be maintained Outcome: Progressing   Problem: Coping: Goal: Level of anxiety will decrease Outcome: Progressing   Problem: Elimination: Goal: Will not experience complications related to bowel motility Outcome: Progressing Goal: Will not experience complications related to urinary retention Outcome: Progressing   Problem: Pain Managment: Goal: General experience of comfort will improve Outcome: Progressing   Problem: Safety: Goal: Ability to remain free from injury will improve Outcome: Progressing   Problem: Skin Integrity: Goal: Risk for impaired skin integrity will decrease Outcome: Progressing

## 2023-01-11 NOTE — Consult Note (Signed)
Urology Consult Note   Requesting Attending Physician:  [name], MD Service Providing Consult: Urology  Consulting Attending: Dr. Berneice Heinrich   Reason for Consult:  Urinary retention, urosepsis  HPI: Victor Huber is seen in consultation for reasons noted above at the request of [name], MD.   ------------------  Assessment:  72 y.o. male s/p TURP 20 years ago, recently see by our office for undifferentiated urinary retention. Flomax was doubled 10/4.   Recommendations: #urinary retention #bladder outlet obstruction # Pelvic stool burden  Continue twice daily tamsulosin  Significant pelvic stool burden again noted on CT, likely contributing to bladder outlet restriction if not obstruction. This was discussed with him in clinic last week.  Recommend aggressive bowel regimen while in hospital.   PVR in hospital in the low 300s, this is in keeping with his numbers recorded in clinic and is likely chronic.  As long as his volumes do not increase significantly from where he is now, we can avoid replacement of Foley catheter.  # UTI  UCx positive for Klebsiella pneumoniae and Enterococcus facialis.  Presently on Unasyn Initial UA on 10/9.  Repeat UA on 10/11 shows no bacteria but nitrite negative. Blood glucose was elevated again on arrival and he is spilling a fair amount into his urine.  Tight control of his blood sugar will significantly help prevent against future urinary tract infections.  Urology will not need to follow.  Please call with questions, concerns, or acute urologic needs.  Case and plan discussed with Dr. Berneice Heinrich  Past Medical History: Past Medical History:  Diagnosis Date   Adenocarcinoma of left lung, stage 3 (HCC) 08/15/2016   Allergy    Anxiety    Arthritis    Atrial fibrillation (HCC) 08/15/2016   Atrial fibrillation (HCC)    Cancer, metastatic to bone [facility])    lung   Cataract    Waiting to schedule bilateral cataract surgery    CHF (congestive heart failure) (HCC) 01/04/2022   COPD (chronic obstructive pulmonary disease) (HCC) 08/15/2016   Depression    Diabetes mellitus, type 2 (HCC)    Dysphagia    Dysrhythmia    a-fib   GERD (gastroesophageal reflux disease)    Heart murmur    atenlol for A Fib/Eliquis   History of chemotherapy    History of radiation therapy    Hyperlipidemia    Hypertension    Hypothyroid 08/15/2016   Longstanding persistent atrial fibrillation (HCC) 08/29/2016   Pathologic fracture    left femur   Pneumonitis    S/P TURP 08/15/2016   T2DM (type 2 diabetes mellitus) (HCC) 05/03/2022   Wears glasses    Wears hearing aid in both ears     Past Surgical History:  Past Surgical History:  Procedure Laterality Date   BIOPSY  12/21/2019   Procedure: BIOPSY;  Surgeon: Lemar Lofty., MD;  Location: [facility] ENDOSCOPY;  Service: Gastroenterology;;   BIOPSY  05/23/2020   Procedure: BIOPSY;  Surgeon: Lemar Lofty., MD;  Location: [facility] ENDOSCOPY;  Service: Gastroenterology;;   BRONCHOSCOPY  10/2014   CARDIAC CATHETERIZATION     05/07/12   CARDIOVERSION     x2   COLONOSCOPY     several yrs   DG BIOPSY LUNG Left 10/2014   FNA - Adenocarcinoma    ESOPHAGOGASTRODUODENOSCOPY (EGD) WITH PROPOFOL N/A 12/21/2019   Procedure: ESOPHAGOGASTRODUODENOSCOPY (EGD) WITH PROPOFOL;  Surgeon: Lemar Lofty., MD;  Location: [facility] ENDOSCOPY;  Service: Gastroenterology;  Laterality: N/A;  ESOPHAGOGASTRODUODENOSCOPY (EGD) WITH PROPOFOL N/A 05/23/2020   Procedure: ESOPHAGOGASTRODUODENOSCOPY (EGD) WITH PROPOFOL;  Surgeon: Meridee Score [name], MD;  Location: [facility] ENDOSCOPY;  Service: Gastroenterology;  Laterality: N/A;   ESOPHAGOGASTRODUODENOSCOPY (EGD) WITH PROPOFOL N/A 06/30/2020   Procedure: ESOPHAGOGASTRODUODENOSCOPY (EGD) WITH PROPOFOL;  Surgeon: Meridee Score [name], MD;  Location: [facility] ENDOSCOPY;  Service: Gastroenterology;  Laterality: N/A;  ultra slim scope avail   EUS N/A  12/21/2019   Procedure: UPPER ENDOSCOPIC ULTRASOUND (EUS) RADIAL;  Surgeon: Lemar Lofty., MD;  Location: [facility] ENDOSCOPY;  Service: Gastroenterology;  Laterality: N/A;   FEMUR IM NAIL Left 02/19/2017   FEMUR IM NAIL Left 02/19/2017   Procedure: INTRAMEDULLARY (IM) NAIL FEMORAL;  Surgeon: Teryl Lucy, MD;  Location: [facility] OR;  Service: Orthopedics;  Laterality: Left;   IR FLUORO GUIDE PORT INSERTION RIGHT  07/03/2017   IR US GUIDE VASC ACCESS RIGHT  07/03/2017   LUNG CANCER SURGERY Left 12/2014   Wedge Resection    MULTIPLE TOOTH EXTRACTIONS     SAVORY DILATION N/A 05/23/2020   Procedure: SAVORY DILATION;  Surgeon: Lemar Lofty., MD;  Location: [facility] ENDOSCOPY;  Service: Gastroenterology;  Laterality: N/A;   SAVORY DILATION N/A 06/30/2020   Procedure: SAVORY DILATION;  Surgeon: Meridee Score [name], MD;  Location: [facility] ENDOSCOPY;  Service: Gastroenterology;  Laterality: N/A;   Status post TURP     TONSILLECTOMY     UPPER GASTROINTESTINAL ENDOSCOPY      Medication: Current Facility-Administered Medications  Medication Dose Route Frequency Provider Last Rate Last Admin   acetaminophen (TYLENOL) tablet 650 mg  650 mg Oral Q6H PRN Synetta Fail, MD   650 mg at 01/10/23 2027   Or   acetaminophen (TYLENOL) suppository 650 mg  650 mg Rectal Q6H PRN Synetta Fail, MD       apixaban Everlene Balls) tablet 5 mg  5 mg Oral BID Synetta Fail, MD   5 mg at 01/10/23 1025   arformoterol (BROVANA) nebulizer solution 15 mcg  15 mcg Nebulization BID Synetta Fail, MD   15 mcg at 01/10/23 2043   And   umeclidinium bromide (INCRUSE ELLIPTA) 62.5 MCG/ACT 1 puff  1 puff Inhalation Daily Synetta Fail, MD   1 puff at 01/10/23 0908   atorvastatin (LIPITOR) tablet 40 mg  40 mg Oral Daily Synetta Fail, MD   40 mg at 01/11/23 0756   bisoprolol (ZEBETA) tablet 5 mg  5 mg Oral Daily Synetta Fail, MD   5 mg at 01/11/23 0803   buPROPion (WELLBUTRIN XL) 24 hr  tablet 150 mg  150 mg Oral Daily Synetta Fail, MD   150 mg at 01/11/23 0756   ceFEPIme (MAXIPIME) 2 g in sodium chloride 0.9 % 100 mL IVPB  2 g Intravenous Q8H Daylene Posey, RPH 200 mL/hr at 01/11/23 0754 2 g at 01/11/23 0754   Chlorhexidine Gluconate Cloth 2 % PADS 6 each  6 each Topical Daily Marcelino Duster, MD   6 each at 01/11/23 0800   diazepam (VALIUM) tablet 5 mg  5 mg Oral Q12H PRN Synetta Fail, MD       digoxin Margit Banda) tablet 0.125 mg  0.125 mg Oral Daily Synetta Fail, MD   0.125 mg at 01/11/23 0756   DULoxetine (CYMBALTA) DR capsule 60 mg  60 mg Oral Daily Synetta Fail, MD   60 mg at 01/11/23 0756   gabapentin (NEURONTIN) capsule 300 mg  300 mg Oral QHS Synetta Fail, MD  300 mg at 01/10/23 2242   influenza vaccine adjuvanted (FLUAD) injection 0.5 mL  0.5 mL Intramuscular Tomorrow-1000 Sreeram, Narendranath, MD       insulin aspart (novoLOG) injection 0-9 Units  0-9 Units Subcutaneous TID WC Synetta Fail, MD   2 Units at 01/10/23 1738   insulin glargine-yfgn [facility]) injection 10 Units  10 Units Subcutaneous Daily Marcelino Duster, MD   10 Units at 01/10/23 1431   lactated ringers infusion   Intravenous Continuous Sundil, Subrina, MD 100 mL/hr at 01/10/23 2247 New Bag at 01/10/23 2247   levalbuterol (XOPENEX) nebulizer solution 0.63 mg  0.63 mg Nebulization Q6H PRN Sundil, Subrina, MD       levothyroxine (SYNTHROID) tablet 175 mcg  175 mcg Oral Daily Synetta Fail, MD   175 mcg at 01/11/23 1610   lipase/protease/amylase (CREON) capsule 36,000 Units  36,000 Units Oral TID Marcy Siren, MD   36,000 Units at 01/11/23 0755   midodrine (PROAMATINE) tablet 10 mg  10 mg Oral TID WC [name], MD   10 mg at 01/11/23 0619   pantoprazole (PROTONIX) EC tablet 40 mg  40 mg Oral Daily Synetta Fail, MD   40 mg at 01/11/23 0756   polyethylene glycol (MIRALAX / GLYCOLAX) packet 17 g  17 g Oral Daily PRN Synetta Fail, MD       sodium chloride flush (NS) 0.9 % injection 3 mL  3 mL Intravenous Q12H Synetta Fail, MD   3 mL at 01/10/23 2244   sodium phosphate (FLEET) enema 1 enema  1 enema Rectal Daily PRN Synetta Fail, MD       sorbitol 70 % solution 30 mL  30 mL Oral Once Synetta Fail, MD       Followed by   sorbitol 70 % solution 30 mL  30 mL Oral Daily PRN Synetta Fail, MD       tamsulosin [facility]) capsule 0.4 mg  0.4 mg Oral Daily Synetta Fail, MD   0.4 mg at 01/11/23 0756    Allergies: Allergies  Allergen Reactions   Carboplatin Itching, Nausea And Vomiting and Other (See Comments)    Flushing   Flecainide Hypertension    CAUSED HEART ISSUES    Debrox [Carbamide Peroxide]     Swelling in ear canal.     Social History: Social History   Tobacco Use   Smoking status: Former    Current packs/day: 0.00    Average packs/day: 1 pack/day for 50.0 years (50.0 ttl pk-yrs)    Types: Cigarettes    Start date: 08/16/1962    Quit date: 08/15/2012    Years since quitting: 10.4   Smokeless tobacco: Never  Vaping Use   Vaping status: Never Used  Substance Use Topics   Alcohol use: No   Drug use: No    Family History Family History  Problem Relation Age of Onset   Breast cancer Mother    Breast cancer Maternal Aunt    Breast cancer Maternal Aunt    Lung disease Neg Hx    Colon cancer Neg Hx    Stomach cancer Neg Hx    Pancreatic cancer Neg Hx    Rectal cancer Neg Hx    Colon polyps Neg Hx    Esophageal cancer Neg Hx     Review of Systems  Gastrointestinal:  Positive for constipation.     Objective   Vital signs in last 24 hours: BP 105/68 (  BP Location: Right Arm)   Pulse 91   Temp 98.4 F (36.9 C) (Oral)   Resp 18   Ht 6\' 1"  (1.854 m)   Wt 100 kg   SpO2 95%   BMI 29.09 kg/m   Physical Exam General: NAD, A&O, resting, appropriate HEENT: [place]/AT Pulmonary: Normal work of breathing Cardiovascular: no cyanosis   Most Recent  Labs: Lab Results  Component Value Date   WBC 8.8 01/11/2023   HGB 12.5 (L) 01/11/2023   HCT 36.5 (L) 01/11/2023   PLT 112 (L) 01/11/2023    Lab Results  Component Value Date   NA 137 01/11/2023   K 3.7 01/11/2023   CL 105 01/11/2023   CO2 24 01/11/2023   BUN 19 01/11/2023   CREATININE 0.99 01/11/2023   CALCIUM 8.1 (L) 01/11/2023   MG 2.1 01/10/2023    Lab Results  Component Value Date   INR 1.3 (H) 01/10/2023   APTT 25 01/09/2023     Urine Culture: @[id number] (laburin,org,[id number])@   IMAGING: DG CHEST PORT 1 VIEW  Result Date: 01/09/2023 CLINICAL DATA:  [id number] Acute hypoxic respiratory failure (HCC) [id number] EXAM: PORTABLE CHEST 1 VIEW COMPARISON:  Chest x-ray 10/09/2022 11:23 a.m. FINDINGS: Accessed right chest wall catheter with tip overlying the expected region super cavoatrial junction. Widened mediastinum with component likely due to AP portable technique. Persistent cardiomegaly. The heart and mediastinal contours are unchanged. Prominent hilar vasculature. Atherosclerotic plaque. No focal consolidation. Chronic coarsened markings. No pleural effusion. No pneumothorax. No acute osseous abnormality. IMPRESSION: 1. Widened mediastinum with component likely due to AP portable technique. Recommend repeat PA and lateral x-ray view of the chest for re-evaluation 2. Persistent cardiomegaly with underlying pericardial effusion not excluded. 3. Central venous congestion. 4. Aortic Atherosclerosis (ICD10-I70.0) and Emphysema (ICD10-J43.9). Electronically Signed   By: Tish Frederickson M.D.   On: 01/09/2023 23:40   CT ABDOMEN PELVIS W CONTRAST  Result Date: 01/09/2023 CLINICAL DATA:  Sepsis, abdominal pain, PE suspected, hypoxia, history of lung cancer * Tracking Code: BO * EXAM: CT ANGIOGRAPHY CHEST WITH CONTRAST CT ABDOMEN PELVIS WITH CONTRAST TECHNIQUE: Multidetector CT imaging of the chest was performed using the standard protocol during bolus administration of intravenous  contrast. Multiplanar CT image reconstructions and MIPs were obtained to evaluate the vascular anatomy. Multidetector CT imaging of the abdomen and pelvis was performed using the standard protocol following bolus administration of intravenous contrast. RADIATION DOSE REDUCTION: This exam was performed according to the departmental dose-optimization program which includes automated exposure control, adjustment of the mA and/or kV according to patient size and/or use of iterative reconstruction technique. CONTRAST:  75mL OMNIPAQUE IOHEXOL 350 MG/ML SOLN COMPARISON:  CT chest abdomen pelvis, 08/16/2022 FINDINGS: CT CHEST ANGIOGRAM FINDINGS Cardiovascular: Satisfactory opacification of the pulmonary arteries to the segmental level. No evidence of pulmonary embolism. Cardiomegaly. Three-vessel coronary artery calcifications. No pericardial effusion. Aortic atherosclerosis. Right chest port catheter. Mediastinum/Nodes: Interval enlargement of right paratracheal nodes measuring up to 2.4 x 1.2 cm, previously 1.8 x 0.7 cm (series 8, image 55). Interval enlargement of subcarinal nodes measuring up to 2.6 x 1.7 cm, previously 2.3 x 1.1 cm (series 8, image 89). Thyroid gland, trachea, and esophagus demonstrate no significant findings. Lungs/Pleura: Unchanged post treatment/postoperative appearance of the dependent left lower lobe with dense consolidation and fibrosis about a suture line (series 9, image 81). Severe emphysema. No pleural effusion or pneumothorax. Musculoskeletal: No chest wall abnormality. No acute osseous findings. Review of the MIP images confirms the above findings. CT ABDOMEN PELVIS  FINDINGS Hepatobiliary: No solid liver abnormality is seen. Gallstones. Gallbladder wall thickening, or biliary dilatation. Pancreas: Diffusely atrophic pancreas. No pancreatic ductal dilatation or surrounding inflammatory changes. Spleen: Normal in size without significant abnormality. Adrenals/Urinary Tract: Unchanged benign  adenoma of the medial limb of the right adrenal gland requiring no specific further follow-up or characterization. (Series 5, image 29). Simple parapelvic and renal cortical cysts, for which no further follow-up or characterization is required. Kidneys are otherwise normal, without renal calculi, solid lesion, or hydronephrosis. Bladder is unremarkable. Stomach/Bowel: Stomach is within normal limits. Appendix appears normal. No evidence of bowel wall thickening, distention, or inflammatory changes. Moderate burden of stool and stool balls throughout the colon and rectum. Vascular/Lymphatic: No significant vascular findings are present. No enlarged abdominal or pelvic lymph nodes. Reproductive: Mild prostatomegaly.  TURP defect. Other: Small fat containing epigastric hernia (series 5, image 54). Small fat containing bilateral inguinal hernias. No ascites. Musculoskeletal: No acute or significant osseous findings. IMPRESSION: 1. Negative examination for pulmonary embolism. 2. Unchanged post treatment/postoperative appearance of the dependent left lower lobe. 3. Interval enlargement of right paratracheal and subcarinal lymph nodes, nonspecific, possibly reactive although worrisome for nodal metastatic disease. These could be characterized for abnormal metabolic activity by PET-CT on a nonemergent, outpatient basis if desired. 4. No acute CT findings of the abdomen or pelvis. 5. Cholelithiasis without evidence of acute cholecystitis. 6. Severe emphysema. 7. Coronary artery disease. Aortic Atherosclerosis (ICD10-I70.0) and Emphysema (ICD10-J43.9). Electronically Signed   By: Jearld Lesch M.D.   On: 01/09/2023 13:01   CT Angio Chest PE W and/or Wo Contrast  Result Date: 01/09/2023 CLINICAL DATA:  Sepsis, abdominal pain, PE suspected, hypoxia, history of lung cancer * Tracking Code: BO * EXAM: CT ANGIOGRAPHY CHEST WITH CONTRAST CT ABDOMEN PELVIS WITH CONTRAST TECHNIQUE: Multidetector CT imaging of the chest was  performed using the standard protocol during bolus administration of intravenous contrast. Multiplanar CT image reconstructions and MIPs were obtained to evaluate the vascular anatomy. Multidetector CT imaging of the abdomen and pelvis was performed using the standard protocol following bolus administration of intravenous contrast. RADIATION DOSE REDUCTION: This exam was performed according to the departmental dose-optimization program which includes automated exposure control, adjustment of the mA and/or kV according to patient size and/or use of iterative reconstruction technique. CONTRAST:  75mL OMNIPAQUE IOHEXOL 350 MG/ML SOLN COMPARISON:  CT chest abdomen pelvis, 08/16/2022 FINDINGS: CT CHEST ANGIOGRAM FINDINGS Cardiovascular: Satisfactory opacification of the pulmonary arteries to the segmental level. No evidence of pulmonary embolism. Cardiomegaly. Three-vessel coronary artery calcifications. No pericardial effusion. Aortic atherosclerosis. Right chest port catheter. Mediastinum/Nodes: Interval enlargement of right paratracheal nodes measuring up to 2.4 x 1.2 cm, previously 1.8 x 0.7 cm (series 8, image 55). Interval enlargement of subcarinal nodes measuring up to 2.6 x 1.7 cm, previously 2.3 x 1.1 cm (series 8, image 89). Thyroid gland, trachea, and esophagus demonstrate no significant findings. Lungs/Pleura: Unchanged post treatment/postoperative appearance of the dependent left lower lobe with dense consolidation and fibrosis about a suture line (series 9, image 81). Severe emphysema. No pleural effusion or pneumothorax. Musculoskeletal: No chest wall abnormality. No acute osseous findings. Review of the MIP images confirms the above findings. CT ABDOMEN PELVIS FINDINGS Hepatobiliary: No solid liver abnormality is seen. Gallstones. Gallbladder wall thickening, or biliary dilatation. Pancreas: Diffusely atrophic pancreas. No pancreatic ductal dilatation or surrounding inflammatory changes. Spleen: Normal in  size without significant abnormality. Adrenals/Urinary Tract: Unchanged benign adenoma of the medial limb of the right adrenal gland requiring no specific  further follow-up or characterization. (Series 5, image 29). Simple parapelvic and renal cortical cysts, for which no further follow-up or characterization is required. Kidneys are otherwise normal, without renal calculi, solid lesion, or hydronephrosis. Bladder is unremarkable. Stomach/Bowel: Stomach is within normal limits. Appendix appears normal. No evidence of bowel wall thickening, distention, or inflammatory changes. Moderate burden of stool and stool balls throughout the colon and rectum. Vascular/Lymphatic: No significant vascular findings are present. No enlarged abdominal or pelvic lymph nodes. Reproductive: Mild prostatomegaly.  TURP defect. Other: Small fat containing epigastric hernia (series 5, image 54). Small fat containing bilateral inguinal hernias. No ascites. Musculoskeletal: No acute or significant osseous findings. IMPRESSION: 1. Negative examination for pulmonary embolism. 2. Unchanged post treatment/postoperative appearance of the dependent left lower lobe. 3. Interval enlargement of right paratracheal and subcarinal lymph nodes, nonspecific, possibly reactive although worrisome for nodal metastatic disease. These could be characterized for abnormal metabolic activity by PET-CT on a nonemergent, outpatient basis if desired. 4. No acute CT findings of the abdomen or pelvis. 5. Cholelithiasis without evidence of acute cholecystitis. 6. Severe emphysema. 7. Coronary artery disease. Aortic Atherosclerosis (ICD10-I70.0) and Emphysema (ICD10-J43.9). Electronically Signed   By: Jearld Lesch M.D.   On: 01/09/2023 13:01   DG Chest Portable 1 View  Result Date: 01/09/2023 CLINICAL DATA:  sepsis.  Shortness of breath.  Fall. EXAM: PORTABLE CHEST 1 VIEW COMPARISON:  04/04/2017. FINDINGS: Bilateral lungs appear hyperlucent with coarse  bronchovascular markings, in keeping with COPD. There are probable atelectatic changes at the left lung base. Bilateral lungs otherwise appear clear. No dense consolidation or lung collapse. Subtle blunting of left lateral costophrenic angle may be due to trace pleural effusion versus overlying lung parenchymal opacities. Right lateral costophrenic angle is clear. Mildly enlarged cardio-mediastinal silhouette, which is accentuated by AP technique. No acute osseous abnormalities. The soft tissues are within normal limits. Right-sided CT Port-A-Cath is seen with its tip overlying the midportion of superior vena cava. IMPRESSION: 1. COPD.  Probable atelectatic changes at the left lung base. 2. Subtle blunting of the left lateral costophrenic angle may be due to trace pleural effusion versus overlying lung parenchymal opacities. 3. Mild cardiomegaly. Electronically Signed   By: Jules Schick M.D.   On: 01/09/2023 12:18    ------  Elmon Kirschner, NP Pager: [phone]   Please contact the urology consult pager with any further questions/concerns.

## 2023-01-11 NOTE — Progress Notes (Signed)
Mobility Specialist Progress Note;   01/11/23 1130  Mobility  Activity Transferred from chair to bed  Level of Assistance Modified independent, requires aide device or extra time  Assistive Device None  Distance Ambulated (ft) 3 ft  Activity Response Tolerated well  Mobility Referral Yes  $Mobility charge 1 Mobility  Mobility Specialist Start Time (ACUTE ONLY) 1130  Mobility Specialist Stop Time (ACUTE ONLY) 1140  Mobility Specialist Time Calculation (min) (ACUTE ONLY) 10 min   Pt agreeable to mobility. Requested assistance for transfer C>B. Required no physical assistance for transfer. Asx throughout session and no c/o. Pt left in bed with all needs met.   Caesar Bookman Mobility Specialist Please contact via SecureChat or Rehab Office [phone]

## 2023-01-11 NOTE — Progress Notes (Signed)
PHARMACY - PHYSICIAN COMMUNICATION CRITICAL VALUE ALERT - BLOOD CULTURE IDENTIFICATION (BCID)  Victor Huber is an 72 y.o. male who presented to [facility] on 01/09/2023 with a chief complaint of shortness of breath and weakness at home  Assessment:  Klebsiella pneumoniae, likely urinary source  Name of physician (or Provider) Contacted: Dr. Susa Raring  Current antibiotics: Unasyn  Changes to prescribed antibiotics recommended: Urine culture with K. Pneumo, sensitive to Unasyn.  Patient is on recommended antibiotics - No changes needed  Results for orders placed or performed during the hospital encounter of 01/09/23  Blood Culture ID Panel (Reflexed) (Collected: 01/09/2023 10:32 AM)  Result Value Ref Range   Enterococcus faecalis NOT DETECTED NOT DETECTED   Enterococcus Faecium NOT DETECTED NOT DETECTED   Listeria monocytogenes NOT DETECTED NOT DETECTED   Staphylococcus species NOT DETECTED NOT DETECTED   Staphylococcus aureus (BCID) NOT DETECTED NOT DETECTED   Staphylococcus epidermidis NOT DETECTED NOT DETECTED   Staphylococcus lugdunensis NOT DETECTED NOT DETECTED   Streptococcus species NOT DETECTED NOT DETECTED   Streptococcus agalactiae NOT DETECTED NOT DETECTED   Streptococcus pneumoniae NOT DETECTED NOT DETECTED   Streptococcus pyogenes NOT DETECTED NOT DETECTED   A.calcoaceticus-baumannii NOT DETECTED NOT DETECTED   Bacteroides fragilis NOT DETECTED NOT DETECTED   Enterobacterales DETECTED (A) NOT DETECTED   Enterobacter cloacae complex NOT DETECTED NOT DETECTED   Escherichia coli NOT DETECTED NOT DETECTED   Klebsiella aerogenes NOT DETECTED NOT DETECTED   Klebsiella oxytoca NOT DETECTED NOT DETECTED   Klebsiella pneumoniae DETECTED (A) NOT DETECTED   Proteus species NOT DETECTED NOT DETECTED   Salmonella species NOT DETECTED NOT DETECTED   Serratia marcescens NOT DETECTED NOT DETECTED   Haemophilus influenzae NOT DETECTED NOT DETECTED   Neisseria meningitidis  NOT DETECTED NOT DETECTED   Pseudomonas aeruginosa NOT DETECTED NOT DETECTED   Stenotrophomonas maltophilia NOT DETECTED NOT DETECTED   Candida albicans NOT DETECTED NOT DETECTED   Candida auris NOT DETECTED NOT DETECTED   Candida glabrata NOT DETECTED NOT DETECTED   Candida krusei NOT DETECTED NOT DETECTED   Candida parapsilosis NOT DETECTED NOT DETECTED   Candida tropicalis NOT DETECTED NOT DETECTED   Cryptococcus neoformans/gattii NOT DETECTED NOT DETECTED   CTX-M ESBL NOT DETECTED NOT DETECTED   Carbapenem resistance IMP NOT DETECTED NOT DETECTED   Carbapenem resistance KPC NOT DETECTED NOT DETECTED   Carbapenem resistance NDM NOT DETECTED NOT DETECTED   Carbapenem resist OXA 48 LIKE NOT DETECTED NOT DETECTED   Carbapenem resistance VIM NOT DETECTED NOT DETECTED    [name] 01/11/2023  1:35 PM

## 2023-01-11 NOTE — Progress Notes (Addendum)
Urine culture came back with klebsiella and enterococcus. Ok to change to Unasyn before transition to Augmentin per Dr. Thedore Mins.  Addendum  Blood culture is positive for GNR. Possible same  klebsiella from urine. F/u with [facility]   Ulyses Southward, PharmD, BCIDP, AAHIVP, CPP Infectious Disease Pharmacist 01/11/2023 10:08 AM

## 2023-01-11 NOTE — Progress Notes (Signed)
PROGRESS NOTE                                                                                                                                                                                                             Patient Demographics:    Victor Huber, is a 72 y.o. male, DOB - 1951/03/04, QIO:[id number]  Outpatient Primary MD for the patient is [name], MD    LOS - 2  Admit date - 01/09/2023    Chief Complaint  Patient presents with   Shortness of Breath       Brief Narrative (HPI from H&P)   72 y.o. male with medical history significant of hyperlipidemia, diabetes, atrial fibrillation, hypothyroidism, CHF, BPH, lung cancer, anxiety, depression, COPD, chronic pancreatitis presenting after episode of shortness of breath and weakness at home.   Patient has history of BPH status post TURP and had Foley catheter removed yesterday.  Since that time has had increased urinary frequency.  Noted to have fever today to 101 per family and was noted to have some shortness of breath and appeared clammy this morning and slid out of bed with assistance from wife, no true fall.  The ER he was found to be hypotensive, febrile, A-fib and RVR and admitted to the hospital.   Subjective:    Victor Huber today has, No headache, No chest pain, No abdominal pain - No Nausea, No new weakness tingling or numbness, no SOB   Assessment  & Plan :    Sepsis secondary to UTI > Presenting after fever, urinary frequency, shortness of breath, chills for about a day.  Recently had TURP and Foley catheter was removed few days, urine cultures growing Enterococcus faecalis and Klebsiella.  Antibiotics adjusted to Unasyn on 01/11/2023 for better control, continue IV fluids, sepsis pathophysiology is improving, monitor inflammatory markers and final cultures.  Urology has been consulted as well.   BPH -See #1 above, continue home  Flomax  Paroxysmal atrial fibrillation with RVR advised to score of greater than 3. Continue Eliquis, beta-blocker if tolerated by blood pressure, digoxin continued   AKI Hypomagnesemia > Creatinine elevated to 1.39 from baseline 0.9 necessitating of sepsis as above.  Magnesium noted to be 3.9. - Continue with IV fluids as above - Trend renal function and electrolytes - 2 g IV magnesium   Hyperlipidemia -  Continue atorvastatin   Diabetes - SSI   Hypothyroidism - Continue home Synthroid   Chronic combined systolic CHF > Last echo showed EF 40-45%, indeterminate diastolic function, mildly reduced RV function. Not currently on diuretic. - Continue home digoxin, bisoprolol -IV fluids with caution.  Monitor currently appears dehydrated.    Lung cancer > Non-small cell lung cancer metastatic to bone with lymphadenopathy.  Status post wedge resection of left lower lobe mass and lymph node dissection.  Status post chemoradiation.  Status post further palliative radiotherapy of metastatic bone disease.  Had been on Keytruda, this was held since January 2022. > Enlarged lymphadenopathy on CT chest.  Possibly reactive in the setting of sepsis, nonemergent PET follow-up recommended, discussed with family.    Anxiety Depression - Continue home Wellbutrin, duloxetine, as needed Valium   COPD - Replace home Stiolto with formulary Brovana and Incruse - As needed albuterol   Chronic pancreatitis - Continue home Creon         Condition - Extremely Guarded  Family Communication  : Wife bedside on 01/11/2023  Code Status : Full code  Consults  : Urology  PUD Prophylaxis : PPI   Procedures  :     CT chest abdomen pelvis with IV contrast.  1. Negative examination for pulmonary embolism. 2. Unchanged post treatment/postoperative appearance of the dependent left lower lobe. 3. Interval enlargement of right paratracheal and subcarinal lymph nodes, nonspecific, possibly reactive  although worrisome for nodal metastatic disease. These could be characterized for abnormal metabolic activity by PET-CT on a nonemergent, outpatient basis if desired. 4. No acute CT findings of the abdomen or pelvis. 5. Cholelithiasis without evidence of acute cholecystitis. 6. Severe emphysema. 7. Coronary artery disease. Aortic Atherosclerosis (ICD10-I70.0) and Emphysema      Disposition Plan  :    Status is: Inpatient   DVT Prophylaxis  :     apixaban (ELIQUIS) tablet 5 mg     Lab Results  Component Value Date   PLT 112 (L) 01/11/2023    Diet :  Diet Order             Diet Carb Modified Fluid consistency: Thin; Room service appropriate? Yes  Diet effective now                    Inpatient Medications  Scheduled Meds:  apixaban  5 mg Oral BID   arformoterol  15 mcg Nebulization BID   And   umeclidinium bromide  1 puff Inhalation Daily   atorvastatin  40 mg Oral Daily   bisoprolol  5 mg Oral Daily   buPROPion  150 mg Oral Daily   Chlorhexidine Gluconate Cloth  6 each Topical Daily   digoxin  0.125 mg Oral Daily   DULoxetine  60 mg Oral Daily   gabapentin  300 mg Oral QHS   influenza vaccine adjuvanted  0.5 mL Intramuscular Tomorrow-1000   insulin aspart  0-9 Units Subcutaneous TID WC   insulin glargine-yfgn  10 Units Subcutaneous Daily   levothyroxine  175 mcg Oral Daily   lipase/protease/amylase  36,000 Units Oral TID AC   midodrine  10 mg Oral TID WC   pantoprazole  40 mg Oral Daily   sodium chloride flush  3 mL Intravenous Q12H   sorbitol  30 mL Oral Once   tamsulosin  0.4 mg Oral Daily   Continuous Infusions:  ceFEPime (MAXIPIME) IV 2 g (01/11/23 0754)   lactated ringers 100 mL/hr at 01/11/23 0931  PRN Meds:.acetaminophen **OR** acetaminophen, diazepam, levalbuterol, polyethylene glycol, sodium phosphate, sorbitol **FOLLOWED BY** sorbitol   Objective:   Vitals:   01/11/23 0200 01/11/23 0400 01/11/23 0754 01/11/23 0903  BP:  (!) 94/56 105/68    Pulse:  91  87  Resp: (!) 21 20 18 20   Temp:  98.6 F (37 C) 98.4 F (36.9 C)   TempSrc:  Oral Oral   SpO2:  95%  94%  Weight:  100 kg    Height:        Wt Readings from Last 3 Encounters:  01/11/23 100 kg  12/27/22 97.9 kg  12/20/22 97.9 kg     Intake/Output Summary (Last 24 hours) at 01/11/2023 1002 Last data filed at 01/11/2023 0754 Gross per 24 hour  Intake 2848.33 ml  Output 1300 ml  Net 1548.33 ml     Physical Exam  Awake Alert, No new F.N deficits, Normal affect [place].AT,PERRAL Supple Neck, No JVD,   Symmetrical Chest wall movement, Good air movement bilaterally, CTAB RRR,No Gallops,Rubs or new Murmurs,  +ve B.Sounds, Abd Soft, No tenderness,   No Cyanosis, Clubbing or edema      Data Review:    Recent Labs  Lab 01/09/23 1028 01/09/23 1101 01/09/23 2223 01/10/23 0303 01/10/23 2118 01/11/23 0357  WBC 12.6*  --   --  15.6*  --  8.8  HGB 16.1 16.0 12.6* 13.3 12.2* 12.5*  HCT 47.0 47.0 37.0* 36.3* 34.5* 36.5*  PLT 135*  --   --  114*  --  112*  MCV 94.0  --   --  94.0  --  92.2  MCH 32.2  --   --  34.5*  --  31.6  MCHC 34.3  --   --  36.6*  --  34.2  RDW 13.5  --   --  14.5  --  13.8  LYMPHSABS 1.1  --   --   --   --   --   MONOABS 0.1  --   --   --   --   --   EOSABS 0.0  --   --   --   --   --   BASOSABS 0.0  --   --   --   --   --     Recent Labs  Lab 01/09/23 1028 01/09/23 1101 01/09/23 1238 01/09/23 1536 01/09/23 2159 01/09/23 2223 01/10/23 0037 01/10/23 0303 01/10/23 0454 01/11/23 0353 01/11/23 0357  NA 140 140  --   --   --  136  --  135  --   --  137  K 4.5 4.4  --   --   --  4.0  --  3.4*  --   --  3.7  CL 105  --   --   --   --   --   --  103  --   --  105  CO2 18*  --   --   --   --   --   --  20*  --   --  24  ANIONGAP 17*  --   --   --   --   --   --  12  --   --  8  GLUCOSE 220*  --   --   --   --   --   --  210*  --   --  166*  BUN 18  --   --   --   --   --   --  21  --   --  19  CREATININE 1.39*  --   --   --   --    --   --  1.19  --   --  0.99  AST 27  --   --   --   --   --   --  30  --   --   --   ALT 22  --   --   --   --   --   --  23  --   --   --   ALKPHOS 74  --   --   --   --   --   --  56  --   --   --   BILITOT 0.7  --   --   --   --   --   --  0.9  --   --   --   ALBUMIN 4.0  --   --   --   --   --   --  3.2*  --   --   --   CRP  --   --   --   --   --   --   --   --   --  11.2*  --   PROCALCITON  --   --   --   --   --   --   --  37.76  --  33.49  --   LATICACIDVEN  --   --    < > 1.8 7.7*  --  1.7 1.4 1.7  --   --   INR 1.0  --   --   --   --   --   --  1.3*  --   --   --   BNP 127.2*  --   --   --   --   --   --   --   --   --   --   MG 1.6*  --   --   --   --   --   --  2.1  --   --   --   CALCIUM 9.4  --   --   --   --   --   --  8.6*  --   --  8.1*   < > = values in this interval not displayed.      Recent Labs  Lab 01/09/23 1028 01/09/23 1238 01/09/23 1536 01/09/23 2159 01/10/23 0037 01/10/23 0303 01/10/23 0454 01/11/23 0353 01/11/23 0357  CRP  --   --   --   --   --   --   --  11.2*  --   PROCALCITON  --   --   --   --   --  37.76  --  33.49  --   LATICACIDVEN  --    < > 1.8 7.7* 1.7 1.4 1.7  --   --   INR 1.0  --   --   --   --  1.3*  --   --   --   BNP 127.2*  --   --   --   --   --   --   --   --   MG 1.6*  --   --   --   --  2.1  --   --   --   CALCIUM 9.4  --   --   --   --  8.6*  --   --  8.1*   < > = values in this interval not displayed.    --------------------------------------------------------------------------------------------------------------- Lab Results  Component Value Date   CHOL 103 04/18/2022   HDL 36.70 (L) 04/18/2022   LDLCALC 33 04/18/2022   LDLDIRECT 55.0 03/06/2019   TRIG 168.0 (H) 04/18/2022   CHOLHDL 3 04/18/2022    Lab Results  Component Value Date   HGBA1C 8.7 (A) 11/06/2022   No results for input(s): "TSH", "T4TOTAL", "FREET4", "T3FREE", "THYROIDAB" in the last 72 hours. No results for input(s): "VITAMINB12", "FOLATE",  "FERRITIN", "TIBC", "IRON", "RETICCTPCT" in the last 72 hours. ------------------------------------------------------------------------------------------------------------------ Cardiac Enzymes No results for input(s): "CKMB", "TROPONINI", "MYOGLOBIN" in the last 168 hours.  Invalid input(s): "CK"   Radiology Reports DG CHEST PORT 1 VIEW  Result Date: 01/09/2023 CLINICAL DATA:  [id number] Acute hypoxic respiratory failure (HCC) [id number] EXAM: PORTABLE CHEST 1 VIEW COMPARISON:  Chest x-ray 10/09/2022 11:23 a.m. FINDINGS: Accessed right chest wall catheter with tip overlying the expected region super cavoatrial junction. Widened mediastinum with component likely due to AP portable technique. Persistent cardiomegaly. The heart and mediastinal contours are unchanged. Prominent hilar vasculature. Atherosclerotic plaque. No focal consolidation. Chronic coarsened markings. No pleural effusion. No pneumothorax. No acute osseous abnormality. IMPRESSION: 1. Widened mediastinum with component likely due to AP portable technique. Recommend repeat PA and lateral x-ray view of the chest for re-evaluation 2. Persistent cardiomegaly with underlying pericardial effusion not excluded. 3. Central venous congestion. 4. Aortic Atherosclerosis (ICD10-I70.0) and Emphysema (ICD10-J43.9). Electronically Signed   By: Tish Frederickson M.D.   On: 01/09/2023 23:40   CT ABDOMEN PELVIS W CONTRAST  Result Date: 01/09/2023 CLINICAL DATA:  Sepsis, abdominal pain, PE suspected, hypoxia, history of lung cancer * Tracking Code: BO * EXAM: CT ANGIOGRAPHY CHEST WITH CONTRAST CT ABDOMEN PELVIS WITH CONTRAST TECHNIQUE: Multidetector CT imaging of the chest was performed using the standard protocol during bolus administration of intravenous contrast. Multiplanar CT image reconstructions and MIPs were obtained to evaluate the vascular anatomy. Multidetector CT imaging of the abdomen and pelvis was performed using the standard protocol following  bolus administration of intravenous contrast. RADIATION DOSE REDUCTION: This exam was performed according to the departmental dose-optimization program which includes automated exposure control, adjustment of the mA and/or kV according to patient size and/or use of iterative reconstruction technique. CONTRAST:  75mL OMNIPAQUE IOHEXOL 350 MG/ML SOLN COMPARISON:  CT chest abdomen pelvis, 08/16/2022 FINDINGS: CT CHEST ANGIOGRAM FINDINGS Cardiovascular: Satisfactory opacification of the pulmonary arteries to the segmental level. No evidence of pulmonary embolism. Cardiomegaly. Three-vessel coronary artery calcifications. No pericardial effusion. Aortic atherosclerosis. Right chest port catheter. Mediastinum/Nodes: Interval enlargement of right paratracheal nodes measuring up to 2.4 x 1.2 cm, previously 1.8 x 0.7 cm (series 8, image 55). Interval enlargement of subcarinal nodes measuring up to 2.6 x 1.7 cm, previously 2.3 x 1.1 cm (series 8, image 89). Thyroid gland, trachea, and esophagus demonstrate no significant findings. Lungs/Pleura: Unchanged post treatment/postoperative appearance of the dependent left lower lobe with dense consolidation and fibrosis about a suture line (series 9, image 81). Severe emphysema. No pleural effusion or pneumothorax. Musculoskeletal: No chest wall abnormality. No acute osseous findings. Review of the MIP images confirms the above findings. CT ABDOMEN PELVIS FINDINGS Hepatobiliary: No solid liver abnormality is seen. Gallstones. Gallbladder wall thickening, or biliary dilatation. Pancreas: Diffusely atrophic pancreas. No pancreatic ductal dilatation or surrounding inflammatory changes. Spleen: Normal in size without significant abnormality. Adrenals/Urinary Tract: Unchanged benign adenoma of the medial limb of  the right adrenal gland requiring no specific further follow-up or characterization. (Series 5, image 29). Simple parapelvic and renal cortical cysts, for which no further  follow-up or characterization is required. Kidneys are otherwise normal, without renal calculi, solid lesion, or hydronephrosis. Bladder is unremarkable. Stomach/Bowel: Stomach is within normal limits. Appendix appears normal. No evidence of bowel wall thickening, distention, or inflammatory changes. Moderate burden of stool and stool balls throughout the colon and rectum. Vascular/Lymphatic: No significant vascular findings are present. No enlarged abdominal or pelvic lymph nodes. Reproductive: Mild prostatomegaly.  TURP defect. Other: Small fat containing epigastric hernia (series 5, image 54). Small fat containing bilateral inguinal hernias. No ascites. Musculoskeletal: No acute or significant osseous findings. IMPRESSION: 1. Negative examination for pulmonary embolism. 2. Unchanged post treatment/postoperative appearance of the dependent left lower lobe. 3. Interval enlargement of right paratracheal and subcarinal lymph nodes, nonspecific, possibly reactive although worrisome for nodal metastatic disease. These could be characterized for abnormal metabolic activity by PET-CT on a nonemergent, outpatient basis if desired. 4. No acute CT findings of the abdomen or pelvis. 5. Cholelithiasis without evidence of acute cholecystitis. 6. Severe emphysema. 7. Coronary artery disease. Aortic Atherosclerosis (ICD10-I70.0) and Emphysema (ICD10-J43.9). Electronically Signed   By: Jearld Lesch M.D.   On: 01/09/2023 13:01   CT Angio Chest PE W and/or Wo Contrast  Result Date: 01/09/2023 CLINICAL DATA:  Sepsis, abdominal pain, PE suspected, hypoxia, history of lung cancer * Tracking Code: BO * EXAM: CT ANGIOGRAPHY CHEST WITH CONTRAST CT ABDOMEN PELVIS WITH CONTRAST TECHNIQUE: Multidetector CT imaging of the chest was performed using the standard protocol during bolus administration of intravenous contrast. Multiplanar CT image reconstructions and MIPs were obtained to evaluate the vascular anatomy. Multidetector CT  imaging of the abdomen and pelvis was performed using the standard protocol following bolus administration of intravenous contrast. RADIATION DOSE REDUCTION: This exam was performed according to the departmental dose-optimization program which includes automated exposure control, adjustment of the mA and/or kV according to patient size and/or use of iterative reconstruction technique. CONTRAST:  75mL OMNIPAQUE IOHEXOL 350 MG/ML SOLN COMPARISON:  CT chest abdomen pelvis, 08/16/2022 FINDINGS: CT CHEST ANGIOGRAM FINDINGS Cardiovascular: Satisfactory opacification of the pulmonary arteries to the segmental level. No evidence of pulmonary embolism. Cardiomegaly. Three-vessel coronary artery calcifications. No pericardial effusion. Aortic atherosclerosis. Right chest port catheter. Mediastinum/Nodes: Interval enlargement of right paratracheal nodes measuring up to 2.4 x 1.2 cm, previously 1.8 x 0.7 cm (series 8, image 55). Interval enlargement of subcarinal nodes measuring up to 2.6 x 1.7 cm, previously 2.3 x 1.1 cm (series 8, image 89). Thyroid gland, trachea, and esophagus demonstrate no significant findings. Lungs/Pleura: Unchanged post treatment/postoperative appearance of the dependent left lower lobe with dense consolidation and fibrosis about a suture line (series 9, image 81). Severe emphysema. No pleural effusion or pneumothorax. Musculoskeletal: No chest wall abnormality. No acute osseous findings. Review of the MIP images confirms the above findings. CT ABDOMEN PELVIS FINDINGS Hepatobiliary: No solid liver abnormality is seen. Gallstones. Gallbladder wall thickening, or biliary dilatation. Pancreas: Diffusely atrophic pancreas. No pancreatic ductal dilatation or surrounding inflammatory changes. Spleen: Normal in size without significant abnormality. Adrenals/Urinary Tract: Unchanged benign adenoma of the medial limb of the right adrenal gland requiring no specific further follow-up or characterization. (Series  5, image 29). Simple parapelvic and renal cortical cysts, for which no further follow-up or characterization is required. Kidneys are otherwise normal, without renal calculi, solid lesion, or hydronephrosis. Bladder is unremarkable. Stomach/Bowel: Stomach is within normal limits. Appendix  appears normal. No evidence of bowel wall thickening, distention, or inflammatory changes. Moderate burden of stool and stool balls throughout the colon and rectum. Vascular/Lymphatic: No significant vascular findings are present. No enlarged abdominal or pelvic lymph nodes. Reproductive: Mild prostatomegaly.  TURP defect. Other: Small fat containing epigastric hernia (series 5, image 54). Small fat containing bilateral inguinal hernias. No ascites. Musculoskeletal: No acute or significant osseous findings. IMPRESSION: 1. Negative examination for pulmonary embolism. 2. Unchanged post treatment/postoperative appearance of the dependent left lower lobe. 3. Interval enlargement of right paratracheal and subcarinal lymph nodes, nonspecific, possibly reactive although worrisome for nodal metastatic disease. These could be characterized for abnormal metabolic activity by PET-CT on a nonemergent, outpatient basis if desired. 4. No acute CT findings of the abdomen or pelvis. 5. Cholelithiasis without evidence of acute cholecystitis. 6. Severe emphysema. 7. Coronary artery disease. Aortic Atherosclerosis (ICD10-I70.0) and Emphysema (ICD10-J43.9). Electronically Signed   By: Jearld Lesch M.D.   On: 01/09/2023 13:01   DG Chest Portable 1 View  Result Date: 01/09/2023 CLINICAL DATA:  sepsis.  Shortness of breath.  Fall. EXAM: PORTABLE CHEST 1 VIEW COMPARISON:  04/04/2017. FINDINGS: Bilateral lungs appear hyperlucent with coarse bronchovascular markings, in keeping with COPD. There are probable atelectatic changes at the left lung base. Bilateral lungs otherwise appear clear. No dense consolidation or lung collapse. Subtle blunting of left  lateral costophrenic angle may be due to trace pleural effusion versus overlying lung parenchymal opacities. Right lateral costophrenic angle is clear. Mildly enlarged cardio-mediastinal silhouette, which is accentuated by AP technique. No acute osseous abnormalities. The soft tissues are within normal limits. Right-sided CT Port-A-Cath is seen with its tip overlying the midportion of superior vena cava. IMPRESSION: 1. COPD.  Probable atelectatic changes at the left lung base. 2. Subtle blunting of the left lateral costophrenic angle may be due to trace pleural effusion versus overlying lung parenchymal opacities. 3. Mild cardiomegaly. Electronically Signed   By: Jules Schick M.D.   On: 01/09/2023 12:18      Signature  -   Susa Raring M.D on 01/11/2023 at 10:03 AM   -  To page go to [email or web address]

## 2023-01-12 DIAGNOSIS — A419 Sepsis, unspecified organism: Secondary | ICD-10-CM | POA: Diagnosis not present

## 2023-01-12 DIAGNOSIS — N390 Urinary tract infection, site not specified: Secondary | ICD-10-CM | POA: Diagnosis not present

## 2023-01-12 LAB — CBC WITH DIFFERENTIAL/PLATELET
Abs Immature Granulocytes: 0.02 10*3/uL (ref 0.00–0.07)
Basophils Absolute: 0 10*3/uL (ref 0.0–0.1)
Basophils Relative: 0 %
Eosinophils Absolute: 0.1 10*3/uL (ref 0.0–0.5)
Eosinophils Relative: 1 %
HCT: 34.7 % — ABNORMAL LOW (ref 39.0–52.0)
Hemoglobin: 12.2 g/dL — ABNORMAL LOW (ref 13.0–17.0)
Immature Granulocytes: 0 %
Lymphocytes Relative: 21 %
Lymphs Abs: 1.2 10*3/uL (ref 0.7–4.0)
MCH: 31.9 pg (ref 26.0–34.0)
MCHC: 35.2 g/dL (ref 30.0–36.0)
MCV: 90.8 fL (ref 80.0–100.0)
Monocytes Absolute: 0.6 10*3/uL (ref 0.1–1.0)
Monocytes Relative: 11 %
Neutro Abs: 3.6 10*3/uL (ref 1.7–7.7)
Neutrophils Relative %: 67 %
Platelets: 113 10*3/uL — ABNORMAL LOW (ref 150–400)
RBC: 3.82 MIL/uL — ABNORMAL LOW (ref 4.22–5.81)
RDW: 13.8 % (ref 11.5–15.5)
WBC: 5.5 10*3/uL (ref 4.0–10.5)
nRBC: 0 % (ref 0.0–0.2)

## 2023-01-12 LAB — BASIC METABOLIC PANEL
Anion gap: 6 (ref 5–15)
BUN: 11 mg/dL (ref 8–23)
CO2: 25 mmol/L (ref 22–32)
Calcium: 8.2 mg/dL — ABNORMAL LOW (ref 8.9–10.3)
Chloride: 108 mmol/L (ref 98–111)
Creatinine, Ser: 0.82 mg/dL (ref 0.61–1.24)
GFR, Estimated: >60 mL/min (ref >60)
Glucose, Bld: 172 mg/dL — ABNORMAL HIGH (ref 70–99)
Potassium: 3.7 mmol/L (ref 3.5–5.1)
Sodium: 139 mmol/L (ref 135–145)

## 2023-01-12 LAB — GLUCOSE, CAPILLARY
Glucose-Capillary: 195 mg/dL — ABNORMAL HIGH (ref 70–99)
Glucose-Capillary: 157 mg/dL — ABNORMAL HIGH (ref 70–99)
Glucose-Capillary: 262 mg/dL — ABNORMAL HIGH (ref 70–99)
Glucose-Capillary: 198 mg/dL — ABNORMAL HIGH (ref 70–99)
Glucose-Capillary: 245 mg/dL — ABNORMAL HIGH (ref 70–99)

## 2023-01-12 LAB — C-REACTIVE PROTEIN: CRP: 7.3 mg/dL — ABNORMAL HIGH (ref <1.0)

## 2023-01-12 LAB — TSH: TSH: 2.871 u[IU]/mL (ref 0.350–4.500)

## 2023-01-12 LAB — PROCALCITONIN: Procalcitonin: 16.05 ng/mL

## 2023-01-12 LAB — T4, FREE: Free T4: 1.09 ng/dL (ref 0.61–1.12)

## 2023-01-12 LAB — BRAIN NATRIURETIC PEPTIDE: B Natriuretic Peptide: 253.3 pg/mL — ABNORMAL HIGH (ref 0.0–100.0)

## 2023-01-12 LAB — MAGNESIUM: Magnesium: 2 mg/dL (ref 1.7–2.4)

## 2023-01-12 MED ORDER — BISOPROLOL FUMARATE 5 MG PO TABS
5.0000 mg | ORAL_TABLET | Freq: Once | ORAL | Status: DC
  Filled 2023-01-12 (×2): qty 1

## 2023-01-12 MED ORDER — MIDODRINE HCL 5 MG PO TABS
5.0000 mg | ORAL_TABLET | Freq: Three times a day (TID) | ORAL | Status: DC
  Administered 2023-01-12 (×3): 5 mg via ORAL
  Filled 2023-01-12 (×3): qty 1

## 2023-01-12 MED ORDER — BISOPROLOL FUMARATE 10 MG PO TABS
10.0000 mg | ORAL_TABLET | Freq: Every day | ORAL | Status: DC
  Administered 2023-01-13: 10 mg via ORAL
  Filled 2023-01-12: qty 1

## 2023-01-12 MED ORDER — DILTIAZEM HCL 25 MG/5ML IV SOLN: 10.0000 mg | Freq: Four times a day (QID) | INTRAVENOUS | Status: DC | PRN

## 2023-01-12 NOTE — Plan of Care (Signed)

## 2023-01-12 NOTE — Progress Notes (Signed)
PROGRESS NOTE                                                                                                                                                                                                             Patient Demographics:    Victor Huber, is a 72 y.o. male, DOB - May 22, 1950, NWG:[id number]  Outpatient Primary MD for the patient is [name], MD    LOS - 3  Admit date - 01/09/2023    Chief Complaint  Patient presents with   Shortness of Breath       Brief Narrative (HPI from H&P)   72 y.o. male with medical history significant of hyperlipidemia, diabetes, atrial fibrillation, hypothyroidism, CHF, BPH, lung cancer, anxiety, depression, COPD, chronic pancreatitis presenting after episode of shortness of breath and weakness at home.   Patient has history of BPH status post TURP and had Foley catheter removed yesterday.  Since that time has had increased urinary frequency.  Noted to have fever today to 101 per family and was noted to have some shortness of breath and appeared clammy this morning and slid out of bed with assistance from wife, no true fall.  The ER he was found to be hypotensive, febrile, A-fib and RVR and admitted to the hospital.   Subjective:   Patient in bed, appears comfortable, denies any headache, no fever, no chest pain or pressure, no shortness of breath , no abdominal pain. No focal weakness.   Assessment  & Plan :    Sepsis secondary to UTI > Presenting after fever, urinary frequency, shortness of breath, chills for about a day.  Recently had TURP and Foley catheter was removed few days, urine cultures growing Enterococcus faecalis and Klebsiella Klebsiella bacteremia.  Antibiotics adjusted to Unasyn on 01/11/2023 for better control, continue IV fluids, sepsis pathophysiology is improving, monitor inflammatory markers and final cultures.  Urology has been consulted as well.   Continue to monitor.   BPH -See #1 above, continue home Flomax  Paroxysmal atrial fibrillation with RVR advised to score of greater than 3. Continue Eliquis, beta-blocker was increased for better rate control, digoxin continued, as needed IV Cardizem added   AKI Hypomagnesemia > Creatinine elevated to 1.39 from baseline 0.9 necessitating of sepsis as above.  Magnesium noted to be 3.9. - Continue with IV fluids as above -  Trend renal function and electrolytes - 2 g IV magnesium   Hyperlipidemia - Continue atorvastatin   Diabetes - SSI   Hypothyroidism - Continue home Synthroid   Chronic combined systolic CHF > Last echo showed EF 40-45%, indeterminate diastolic function, mildly reduced RV function. Not currently on diuretic. - Continue home digoxin, bisoprolol -IV fluids with caution.  Monitor currently appears dehydrated.    Lung cancer > Non-small cell lung cancer metastatic to bone with lymphadenopathy.  Status post wedge resection of left lower lobe mass and lymph node dissection.  Status post chemoradiation.  Status post further palliative radiotherapy of metastatic bone disease.  Had been on Keytruda, this was held since January 2022. > Enlarged lymphadenopathy on CT chest.  Possibly reactive in the setting of sepsis, nonemergent PET follow-up recommended, discussed with family.    Anxiety Depression - Continue home Wellbutrin, duloxetine, as needed Valium   COPD - Replace home Stiolto with formulary Brovana and Incruse - As needed albuterol   Chronic pancreatitis - Continue home Creon         Condition - Extremely Guarded  Family Communication  : Wife bedside on 01/11/2023  Code Status : Full code  Consults  : Urology  PUD Prophylaxis : PPI   Procedures  :     CT chest abdomen pelvis with IV contrast.  1. Negative examination for pulmonary embolism. 2. Unchanged post treatment/postoperative appearance of the dependent left lower lobe. 3. Interval  enlargement of right paratracheal and subcarinal lymph nodes, nonspecific, possibly reactive although worrisome for nodal metastatic disease. These could be characterized for abnormal metabolic activity by PET-CT on a nonemergent, outpatient basis if desired. 4. No acute CT findings of the abdomen or pelvis. 5. Cholelithiasis without evidence of acute cholecystitis. 6. Severe emphysema. 7. Coronary artery disease. Aortic Atherosclerosis (ICD10-I70.0) and Emphysema      Disposition Plan  :    Status is: Inpatient   DVT Prophylaxis  :     apixaban (ELIQUIS) tablet 5 mg     Lab Results  Component Value Date   PLT 113 (L) 01/12/2023    Diet :  Diet Order             Diet Carb Modified Fluid consistency: Thin; Room service appropriate? Yes  Diet effective now                    Inpatient Medications  Scheduled Meds:  apixaban  5 mg Oral BID   arformoterol  15 mcg Nebulization BID   And   umeclidinium bromide  1 puff Inhalation Daily   atorvastatin  40 mg Oral Daily   [START ON 01/13/2023] bisoprolol  10 mg Oral Daily   bisoprolol  5 mg Oral Once   buPROPion  150 mg Oral Daily   Chlorhexidine Gluconate Cloth  6 each Topical Daily   digoxin  0.125 mg Oral Daily   DULoxetine  60 mg Oral Daily   gabapentin  300 mg Oral QHS   influenza vaccine adjuvanted  0.5 mL Intramuscular Tomorrow-1000   insulin aspart  0-9 Units Subcutaneous TID WC   insulin glargine-yfgn  10 Units Subcutaneous Daily   levothyroxine  175 mcg Oral Daily   lipase/protease/amylase  36,000 Units Oral TID AC   midodrine  5 mg Oral TID WC   pantoprazole  40 mg Oral Daily   sodium chloride flush  3 mL Intravenous Q12H   sorbitol  30 mL Oral Once   tamsulosin  0.4  mg Oral Daily   Continuous Infusions:  ampicillin-sulbactam (UNASYN) IV 3 g (01/12/23 0511)   PRN Meds:.acetaminophen **OR** acetaminophen, diazepam, diltiazem, levalbuterol, polyethylene glycol, sodium phosphate, sorbitol **FOLLOWED BY**  sorbitol   Objective:   Vitals:   01/11/23 2000 01/11/23 2035 01/12/23 0232 01/12/23 0759  BP:    130/81  Pulse:      Resp:    20  Temp: 97.9 F (36.6 C)     TempSrc: Oral     SpO2: 98% 97%  94%  Weight:   101.2 kg   Height:        Wt Readings from Last 3 Encounters:  01/12/23 101.2 kg  12/27/22 97.9 kg  12/20/22 97.9 kg     Intake/Output Summary (Last 24 hours) at 01/12/2023 0953 Last data filed at 01/12/2023 0300 Gross per 24 hour  Intake 696.68 ml  Output 300 ml  Net 396.68 ml     Physical Exam  Awake Alert, No new F.N deficits, Normal affect [place].AT,PERRAL Supple Neck, No JVD,   Symmetrical Chest wall movement, Good air movement bilaterally, CTAB iRRR,No Gallops,Rubs or new Murmurs,  +ve B.Sounds, Abd Soft, No tenderness,   No Cyanosis, Clubbing or edema      Data Review:    Recent Labs  Lab 01/09/23 1028 01/09/23 1101 01/09/23 2223 01/10/23 0303 01/10/23 2118 01/11/23 0357 01/12/23 0304  WBC 12.6*  --   --  15.6*  --  8.8 5.5  HGB 16.1   < > 12.6* 13.3 12.2* 12.5* 12.2*  HCT 47.0   < > 37.0* 36.3* 34.5* 36.5* 34.7*  PLT 135*  --   --  114*  --  112* 113*  MCV 94.0  --   --  94.0  --  92.2 90.8  MCH 32.2  --   --  34.5*  --  31.6 31.9  MCHC 34.3  --   --  36.6*  --  34.2 35.2  RDW 13.5  --   --  14.5  --  13.8 13.8  LYMPHSABS 1.1  --   --   --   --   --  1.2  MONOABS 0.1  --   --   --   --   --  0.6  EOSABS 0.0  --   --   --   --   --  0.1  BASOSABS 0.0  --   --   --   --   --  0.0   < > = values in this interval not displayed.    Recent Labs  Lab 01/09/23 1028 01/09/23 1101 01/09/23 1238 01/09/23 1536 01/09/23 2159 01/09/23 2223 01/10/23 0037 01/10/23 0303 01/10/23 0454 01/11/23 0353 01/11/23 0357 01/12/23 0304  NA 140 140  --   --   --  136  --  135  --   --  137 139  K 4.5 4.4  --   --   --  4.0  --  3.4*  --   --  3.7 3.7  CL 105  --   --   --   --   --   --  103  --   --  105 108  CO2 18*  --   --   --   --   --   --  20*   --   --  24 25  ANIONGAP 17*  --   --   --   --   --   --  12  --   --  8 6  GLUCOSE 220*  --   --   --   --   --   --  210*  --   --  166* 172*  BUN 18  --   --   --   --   --   --  21  --   --  19 11  CREATININE 1.39*  --   --   --   --   --   --  1.19  --   --  0.99 0.82  AST 27  --   --   --   --   --   --  30  --   --   --   --   ALT 22  --   --   --   --   --   --  23  --   --   --   --   ALKPHOS 74  --   --   --   --   --   --  56  --   --   --   --   BILITOT 0.7  --   --   --   --   --   --  0.9  --   --   --   --   ALBUMIN 4.0  --   --   --   --   --   --  3.2*  --   --   --   --   CRP  --   --   --   --   --   --   --   --   --  11.2*  --  7.3*  PROCALCITON  --   --   --   --   --   --   --  37.76  --  33.49  --  16.05  LATICACIDVEN  --   --    < > 1.8 7.7*  --  1.7 1.4 1.7  --   --   --   INR 1.0  --   --   --   --   --   --  1.3*  --   --   --   --   BNP 127.2*  --   --   --   --   --   --   --   --   --   --  253.3*  MG 1.6*  --   --   --   --   --   --  2.1  --   --   --  2.0  CALCIUM 9.4  --   --   --   --   --   --  8.6*  --   --  8.1* 8.2*   < > = values in this interval not displayed.      Recent Labs  Lab 01/09/23 1028 01/09/23 1238 01/09/23 1536 01/09/23 2159 01/10/23 0037 01/10/23 0303 01/10/23 0454 01/11/23 0353 01/11/23 0357 01/12/23 0304  CRP  --   --   --   --   --   --   --  11.2*  --  7.3*  PROCALCITON  --   --   --   --   --  37.76  --  33.49  --  16.05  LATICACIDVEN  --    < > 1.8 7.7* 1.7  1.4 1.7  --   --   --   INR 1.0  --   --   --   --  1.3*  --   --   --   --   BNP 127.2*  --   --   --   --   --   --   --   --  253.3*  MG 1.6*  --   --   --   --  2.1  --   --   --  2.0  CALCIUM 9.4  --   --   --   --  8.6*  --   --  8.1* 8.2*   < > = values in this interval not displayed.    --------------------------------------------------------------------------------------------------------------- Lab Results  Component Value Date   CHOL 103  04/18/2022   HDL 36.70 (L) 04/18/2022   LDLCALC 33 04/18/2022   LDLDIRECT 55.0 03/06/2019   TRIG 168.0 (H) 04/18/2022   CHOLHDL 3 04/18/2022    Lab Results  Component Value Date   HGBA1C 8.7 (A) 11/06/2022   No results for input(s): "TSH", "T4TOTAL", "FREET4", "T3FREE", "THYROIDAB" in the last 72 hours. No results for input(s): "VITAMINB12", "FOLATE", "FERRITIN", "TIBC", "IRON", "RETICCTPCT" in the last 72 hours. ------------------------------------------------------------------------------------------------------------------ Cardiac Enzymes No results for input(s): "CKMB", "TROPONINI", "MYOGLOBIN" in the last 168 hours.  Invalid input(s): "CK"   Radiology Reports DG CHEST PORT 1 VIEW  Result Date: 01/09/2023 CLINICAL DATA:  [id number] Acute hypoxic respiratory failure (HCC) [id number] EXAM: PORTABLE CHEST 1 VIEW COMPARISON:  Chest x-ray 10/09/2022 11:23 a.m. FINDINGS: Accessed right chest wall catheter with tip overlying the expected region super cavoatrial junction. Widened mediastinum with component likely due to AP portable technique. Persistent cardiomegaly. The heart and mediastinal contours are unchanged. Prominent hilar vasculature. Atherosclerotic plaque. No focal consolidation. Chronic coarsened markings. No pleural effusion. No pneumothorax. No acute osseous abnormality. IMPRESSION: 1. Widened mediastinum with component likely due to AP portable technique. Recommend repeat PA and lateral x-ray view of the chest for re-evaluation 2. Persistent cardiomegaly with underlying pericardial effusion not excluded. 3. Central venous congestion. 4. Aortic Atherosclerosis (ICD10-I70.0) and Emphysema (ICD10-J43.9). Electronically Signed   By: Tish Frederickson M.D.   On: 01/09/2023 23:40   CT ABDOMEN PELVIS W CONTRAST  Result Date: 01/09/2023 CLINICAL DATA:  Sepsis, abdominal pain, PE suspected, hypoxia, history of lung cancer * Tracking Code: BO * EXAM: CT ANGIOGRAPHY CHEST WITH CONTRAST CT  ABDOMEN PELVIS WITH CONTRAST TECHNIQUE: Multidetector CT imaging of the chest was performed using the standard protocol during bolus administration of intravenous contrast. Multiplanar CT image reconstructions and MIPs were obtained to evaluate the vascular anatomy. Multidetector CT imaging of the abdomen and pelvis was performed using the standard protocol following bolus administration of intravenous contrast. RADIATION DOSE REDUCTION: This exam was performed according to the departmental dose-optimization program which includes automated exposure control, adjustment of the mA and/or kV according to patient size and/or use of iterative reconstruction technique. CONTRAST:  75mL OMNIPAQUE IOHEXOL 350 MG/ML SOLN COMPARISON:  CT chest abdomen pelvis, 08/16/2022 FINDINGS: CT CHEST ANGIOGRAM FINDINGS Cardiovascular: Satisfactory opacification of the pulmonary arteries to the segmental level. No evidence of pulmonary embolism. Cardiomegaly. Three-vessel coronary artery calcifications. No pericardial effusion. Aortic atherosclerosis. Right chest port catheter. Mediastinum/Nodes: Interval enlargement of right paratracheal nodes measuring up to 2.4 x 1.2 cm, previously 1.8 x 0.7 cm (series 8, image 55). Interval enlargement of subcarinal nodes measuring up to 2.6 x 1.7 cm, previously 2.3 x 1.1 cm (  series 8, image 89). Thyroid gland, trachea, and esophagus demonstrate no significant findings. Lungs/Pleura: Unchanged post treatment/postoperative appearance of the dependent left lower lobe with dense consolidation and fibrosis about a suture line (series 9, image 81). Severe emphysema. No pleural effusion or pneumothorax. Musculoskeletal: No chest wall abnormality. No acute osseous findings. Review of the MIP images confirms the above findings. CT ABDOMEN PELVIS FINDINGS Hepatobiliary: No solid liver abnormality is seen. Gallstones. Gallbladder wall thickening, or biliary dilatation. Pancreas: Diffusely atrophic pancreas. No  pancreatic ductal dilatation or surrounding inflammatory changes. Spleen: Normal in size without significant abnormality. Adrenals/Urinary Tract: Unchanged benign adenoma of the medial limb of the right adrenal gland requiring no specific further follow-up or characterization. (Series 5, image 29). Simple parapelvic and renal cortical cysts, for which no further follow-up or characterization is required. Kidneys are otherwise normal, without renal calculi, solid lesion, or hydronephrosis. Bladder is unremarkable. Stomach/Bowel: Stomach is within normal limits. Appendix appears normal. No evidence of bowel wall thickening, distention, or inflammatory changes. Moderate burden of stool and stool balls throughout the colon and rectum. Vascular/Lymphatic: No significant vascular findings are present. No enlarged abdominal or pelvic lymph nodes. Reproductive: Mild prostatomegaly.  TURP defect. Other: Small fat containing epigastric hernia (series 5, image 54). Small fat containing bilateral inguinal hernias. No ascites. Musculoskeletal: No acute or significant osseous findings. IMPRESSION: 1. Negative examination for pulmonary embolism. 2. Unchanged post treatment/postoperative appearance of the dependent left lower lobe. 3. Interval enlargement of right paratracheal and subcarinal lymph nodes, nonspecific, possibly reactive although worrisome for nodal metastatic disease. These could be characterized for abnormal metabolic activity by PET-CT on a nonemergent, outpatient basis if desired. 4. No acute CT findings of the abdomen or pelvis. 5. Cholelithiasis without evidence of acute cholecystitis. 6. Severe emphysema. 7. Coronary artery disease. Aortic Atherosclerosis (ICD10-I70.0) and Emphysema (ICD10-J43.9). Electronically Signed   By: Jearld Lesch M.D.   On: 01/09/2023 13:01   CT Angio Chest PE W and/or Wo Contrast  Result Date: 01/09/2023 CLINICAL DATA:  Sepsis, abdominal pain, PE suspected, hypoxia, history of  lung cancer * Tracking Code: BO * EXAM: CT ANGIOGRAPHY CHEST WITH CONTRAST CT ABDOMEN PELVIS WITH CONTRAST TECHNIQUE: Multidetector CT imaging of the chest was performed using the standard protocol during bolus administration of intravenous contrast. Multiplanar CT image reconstructions and MIPs were obtained to evaluate the vascular anatomy. Multidetector CT imaging of the abdomen and pelvis was performed using the standard protocol following bolus administration of intravenous contrast. RADIATION DOSE REDUCTION: This exam was performed according to the departmental dose-optimization program which includes automated exposure control, adjustment of the mA and/or kV according to patient size and/or use of iterative reconstruction technique. CONTRAST:  75mL OMNIPAQUE IOHEXOL 350 MG/ML SOLN COMPARISON:  CT chest abdomen pelvis, 08/16/2022 FINDINGS: CT CHEST ANGIOGRAM FINDINGS Cardiovascular: Satisfactory opacification of the pulmonary arteries to the segmental level. No evidence of pulmonary embolism. Cardiomegaly. Three-vessel coronary artery calcifications. No pericardial effusion. Aortic atherosclerosis. Right chest port catheter. Mediastinum/Nodes: Interval enlargement of right paratracheal nodes measuring up to 2.4 x 1.2 cm, previously 1.8 x 0.7 cm (series 8, image 55). Interval enlargement of subcarinal nodes measuring up to 2.6 x 1.7 cm, previously 2.3 x 1.1 cm (series 8, image 89). Thyroid gland, trachea, and esophagus demonstrate no significant findings. Lungs/Pleura: Unchanged post treatment/postoperative appearance of the dependent left lower lobe with dense consolidation and fibrosis about a suture line (series 9, image 81). Severe emphysema. No pleural effusion or pneumothorax. Musculoskeletal: No chest wall abnormality. No acute osseous  findings. Review of the MIP images confirms the above findings. CT ABDOMEN PELVIS FINDINGS Hepatobiliary: No solid liver abnormality is seen. Gallstones. Gallbladder wall  thickening, or biliary dilatation. Pancreas: Diffusely atrophic pancreas. No pancreatic ductal dilatation or surrounding inflammatory changes. Spleen: Normal in size without significant abnormality. Adrenals/Urinary Tract: Unchanged benign adenoma of the medial limb of the right adrenal gland requiring no specific further follow-up or characterization. (Series 5, image 29). Simple parapelvic and renal cortical cysts, for which no further follow-up or characterization is required. Kidneys are otherwise normal, without renal calculi, solid lesion, or hydronephrosis. Bladder is unremarkable. Stomach/Bowel: Stomach is within normal limits. Appendix appears normal. No evidence of bowel wall thickening, distention, or inflammatory changes. Moderate burden of stool and stool balls throughout the colon and rectum. Vascular/Lymphatic: No significant vascular findings are present. No enlarged abdominal or pelvic lymph nodes. Reproductive: Mild prostatomegaly.  TURP defect. Other: Small fat containing epigastric hernia (series 5, image 54). Small fat containing bilateral inguinal hernias. No ascites. Musculoskeletal: No acute or significant osseous findings. IMPRESSION: 1. Negative examination for pulmonary embolism. 2. Unchanged post treatment/postoperative appearance of the dependent left lower lobe. 3. Interval enlargement of right paratracheal and subcarinal lymph nodes, nonspecific, possibly reactive although worrisome for nodal metastatic disease. These could be characterized for abnormal metabolic activity by PET-CT on a nonemergent, outpatient basis if desired. 4. No acute CT findings of the abdomen or pelvis. 5. Cholelithiasis without evidence of acute cholecystitis. 6. Severe emphysema. 7. Coronary artery disease. Aortic Atherosclerosis (ICD10-I70.0) and Emphysema (ICD10-J43.9). Electronically Signed   By: Jearld Lesch M.D.   On: 01/09/2023 13:01   DG Chest Portable 1 View  Result Date: 01/09/2023 CLINICAL DATA:   sepsis.  Shortness of breath.  Fall. EXAM: PORTABLE CHEST 1 VIEW COMPARISON:  04/04/2017. FINDINGS: Bilateral lungs appear hyperlucent with coarse bronchovascular markings, in keeping with COPD. There are probable atelectatic changes at the left lung base. Bilateral lungs otherwise appear clear. No dense consolidation or lung collapse. Subtle blunting of left lateral costophrenic angle may be due to trace pleural effusion versus overlying lung parenchymal opacities. Right lateral costophrenic angle is clear. Mildly enlarged cardio-mediastinal silhouette, which is accentuated by AP technique. No acute osseous abnormalities. The soft tissues are within normal limits. Right-sided CT Port-A-Cath is seen with its tip overlying the midportion of superior vena cava. IMPRESSION: 1. COPD.  Probable atelectatic changes at the left lung base. 2. Subtle blunting of the left lateral costophrenic angle may be due to trace pleural effusion versus overlying lung parenchymal opacities. 3. Mild cardiomegaly. Electronically Signed   By: Jules Schick M.D.   On: 01/09/2023 12:18      Signature  -   Susa Raring M.D on 01/12/2023 at 9:53 AM   -  To page go to [email or web address]

## 2023-01-13 DIAGNOSIS — A419 Sepsis, unspecified organism: Secondary | ICD-10-CM | POA: Diagnosis not present

## 2023-01-13 DIAGNOSIS — N390 Urinary tract infection, site not specified: Secondary | ICD-10-CM | POA: Diagnosis not present

## 2023-01-13 LAB — CBC WITH DIFFERENTIAL/PLATELET
Abs Immature Granulocytes: 0.03 10*3/uL (ref 0.00–0.07)
Basophils Absolute: 0 10*3/uL (ref 0.0–0.1)
Basophils Relative: 0 %
Eosinophils Absolute: 0.1 10*3/uL (ref 0.0–0.5)
Eosinophils Relative: 2 %
HCT: 37.7 % — ABNORMAL LOW (ref 39.0–52.0)
Hemoglobin: 13.3 g/dL (ref 13.0–17.0)
Immature Granulocytes: 1 %
Lymphocytes Relative: 24 %
Lymphs Abs: 1.3 10*3/uL (ref 0.7–4.0)
MCH: 32.8 pg (ref 26.0–34.0)
MCHC: 35.3 g/dL (ref 30.0–36.0)
MCV: 92.9 fL (ref 80.0–100.0)
Monocytes Absolute: 0.6 10*3/uL (ref 0.1–1.0)
Monocytes Relative: 11 %
Neutro Abs: 3.4 10*3/uL (ref 1.7–7.7)
Neutrophils Relative %: 62 %
Platelets: 145 10*3/uL — ABNORMAL LOW (ref 150–400)
RBC: 4.06 MIL/uL — ABNORMAL LOW (ref 4.22–5.81)
RDW: 13.7 % (ref 11.5–15.5)
WBC: 5.4 10*3/uL (ref 4.0–10.5)
nRBC: 0 % (ref 0.0–0.2)

## 2023-01-13 LAB — PROCALCITONIN: Procalcitonin: 5.22 ng/mL

## 2023-01-13 LAB — BASIC METABOLIC PANEL
Anion gap: 3 — ABNORMAL LOW (ref 5–15)
BUN: 5 mg/dL — ABNORMAL LOW (ref 8–23)
CO2: 26 mmol/L (ref 22–32)
Calcium: 8.3 mg/dL — ABNORMAL LOW (ref 8.9–10.3)
Chloride: 109 mmol/L (ref 98–111)
Creatinine, Ser: 0.67 mg/dL (ref 0.61–1.24)
GFR, Estimated: >60 mL/min (ref >60)
Glucose, Bld: 147 mg/dL — ABNORMAL HIGH (ref 70–99)
Potassium: 4.4 mmol/L (ref 3.5–5.1)
Sodium: 138 mmol/L (ref 135–145)

## 2023-01-13 LAB — CULTURE, BLOOD (ROUTINE X 2): Special Requests: ADEQUATE

## 2023-01-13 LAB — GLUCOSE, CAPILLARY: Glucose-Capillary: 134 mg/dL — ABNORMAL HIGH (ref 70–99)

## 2023-01-13 LAB — MAGNESIUM: Magnesium: 1.9 mg/dL (ref 1.7–2.4)

## 2023-01-13 LAB — BRAIN NATRIURETIC PEPTIDE: B Natriuretic Peptide: 255.1 pg/mL — ABNORMAL HIGH (ref 0.0–100.0)

## 2023-01-13 LAB — C-REACTIVE PROTEIN: CRP: 3.7 mg/dL — ABNORMAL HIGH (ref <1.0)

## 2023-01-13 MED ORDER — AMOXICILLIN-POT CLAVULANATE 875-125 MG PO TABS: 1.0000 | ORAL_TABLET | Freq: Two times a day (BID) | ORAL | 0 refills | Status: DC

## 2023-01-13 MED ORDER — MIDODRINE HCL 5 MG PO TABS
5.0000 mg | ORAL_TABLET | Freq: Two times a day (BID) | ORAL | Status: DC
  Administered 2023-01-13: 5 mg via ORAL
  Filled 2023-01-13: qty 1

## 2023-01-13 MED ORDER — LACTULOSE 10 GM/15ML PO SOLN
30.0000 g | Freq: Two times a day (BID) | ORAL | Status: DC
  Filled 2023-01-13: qty 60

## 2023-01-13 MED ORDER — POLYETHYLENE GLYCOL 3350 17 G PO PACK
17.0000 g | PACK | Freq: Every day | ORAL | Status: DC
  Filled 2023-01-13: qty 1

## 2023-01-13 MED ORDER — POLYETHYLENE GLYCOL 3350 17 G PO PACK: 17.0000 g | PACK | Freq: Every day | ORAL | 0 refills | Status: AC | PRN

## 2023-01-13 MED ORDER — DOCUSATE SODIUM 100 MG PO CAPS
200.0000 mg | ORAL_CAPSULE | Freq: Two times a day (BID) | ORAL | Status: DC
  Filled 2023-01-13: qty 2

## 2023-01-13 MED ORDER — BISOPROLOL FUMARATE 10 MG PO TABS: 10.0000 mg | ORAL_TABLET | Freq: Every day | ORAL | 0 refills | Status: DC

## 2023-01-13 NOTE — Discharge Summary (Signed)
Victor Huber GMW:[id number] DOB: May 16, 1950 DOA: 01/09/2023  PCP: [name], MD  Admit date: 01/09/2023  Discharge date: 01/13/2023  Admitted From: Home   Disposition:  Home   Recommendations for Outpatient Follow-up:   Follow up with PCP in 1-2 weeks  PCP Please obtain BMP/CBC, 2 view CXR in 1week,  (see Discharge instructions)   PCP Please follow up on the following pending results: Kindly review CT chest findings, arrange for outpatient urology follow-up within a week and outpatient pulmonary follow-up in 1 to 2 weeks.   Home Health: None   Equipment/Devices: None  Consultations: Urology Discharge Condition: Stable    CODE STATUS: Full    Diet Recommendation: Heart Healthy - Low Carb   Chief Complaint  Patient presents with   Shortness of Breath     Brief history of present illness from the day of admission and additional interim summary     72 y.o. male with medical history significant of hyperlipidemia, diabetes, atrial fibrillation, hypothyroidism, CHF, BPH, lung cancer, anxiety, depression, COPD, chronic pancreatitis presenting after episode of shortness of breath and weakness at home.   Patient has history of BPH status post TURP and had Foley catheter removed yesterday.  Since that time has had increased urinary frequency.  Noted to have fever today to 101 per family and was noted to have some shortness of breath and appeared clammy this morning and slid out of bed with assistance from wife, no true fall.   The ER he was found to be hypotensive, febrile, A-fib and RVR and admitted to the hospital.                                                                   Hospital Course    Sepsis secondary to UTI > Presenting after fever, urinary frequency, shortness of breath, chills for  about a day.  Recently had TURP and Foley catheter was removed few days, urine cultures growing Enterococcus faecalis and Klebsiella Klebsiella bacteremia.  Antibiotics adjusted to Unasyn on 01/11/2023 for better control, continue IV fluids, sepsis pathophysiology completely resolved and is symptom-free, he was seen by urologist as well.  Of note patient at baseline has postvoid residual of between 250 to 350 cc.  Was treated empirically with IV Unasyn based on his culture and sensitivity data, clinically back to baseline will be given 10 more days of oral Augmentin with close outpatient follow-up with PCP and his urologist within a week of discharge.   BPH -See #1 above, continue home Flomax   Paroxysmal atrial fibrillation with RVR advised to score of greater than 3. Continue Eliquis, beta-blocker was increased for better rate control, digoxin continued, table now will be discharged on higher than home dose bisoprolol" home dose digoxin.   AKI Hypomagnesemia Given IV  fluids for hydration with improvement in AKI, magnesium replaced   Hyperlipidemia - Continue atorvastatin   Diabetes - SSI, continue home medications   Hypothyroidism - Continue home Synthroid   Chronic combined systolic CHF > Last echo showed EF 40-45%, indeterminate diastolic function, mildly reduced RV function. Not currently on diuretic. - Continue home digoxin, bisoprolol -IV fluids with caution.  Monitor currently appears dehydrated.     Lung cancer > Non-small cell lung cancer metastatic to bone with lymphadenopathy.  Status post wedge resection of left lower lobe mass and lymph node dissection.  Status post chemoradiation.  Status post further palliative radiotherapy of metastatic bone disease.  Had been on Keytruda, this was held since January 2022. > Enlarged lymphadenopathy on CT chest.  Possibly reactive in the setting of sepsis, nonemergent PET follow-up recommended, discussed with family.      Anxiety Depression - Continue home Wellbutrin, duloxetine, as needed Valium   COPD - Replace home Stiolto with formulary Brovana and Incruse - As needed albuterol   Chronic pancreatitis - Continue home Creon   Discharge diagnosis     Principal Problem:   Sepsis secondary to UTI [facility]) Active Problems:   Chronic atrial fibrillation (HCC)   Chronic obstructive pulmonary disease (HCC)   Centrilobular emphysema (HCC)   Hyperlipidemia   Anxiety disorder   Malignant neoplasm metastatic to bone (HCC)   Adenocarcinoma of left lung, stage 4 (HCC)   BPH s/p TURP   Chronic pancreatitis (HCC)   Depression, major, single episode, moderate (HCC)   CHF (congestive heart failure) (HCC)   T2DM (type 2 diabetes mellitus) (HCC)   Hypomagnesemia    Discharge instructions    Discharge Instructions     Discharge instructions   Complete by: As directed    Follow with Primary MD [name], MD in 7 days follow-up with your Urologist within the next 2 to 3 days of discharge. Follow-up with your pulmonologist and review your CT chest findings within 2 weeks.  Get CBC, CMP,  Magnesium-  checked next visit with your primary MD    Activity: As tolerated with Full fall precautions use walker/cane & assistance as needed  Disposition Home   Diet: Heart Healthy low carbohydrate diet, check CBGs q. [facility]  Special Instructions: If you have smoked or chewed Tobacco  in the last 2 yrs please stop smoking, stop any regular Alcohol  and or any Recreational drug use.  On your next visit with your primary care physician please Get Medicines reviewed and adjusted.  Please request your Prim.MD to go over all Hospital Tests and Procedure/Radiological results at the follow up, please get all Hospital records sent to your Prim MD by signing hospital release before you go home.  If you experience worsening of your admission symptoms, develop shortness of breath, life threatening emergency, suicidal or  homicidal thoughts you must seek medical attention immediately by calling 911 or calling your MD immediately  if symptoms less severe.  You Must read complete instructions/literature along with all the possible adverse reactions/side effects for all the Medicines you take and that have been prescribed to you. Take any new Medicines after you have completely understood and accpet all the possible adverse reactions/side effects.   Do not drive when taking Pain medications.  Do not take more than prescribed Pain, Sleep and Anxiety Medications   Increase activity slowly   Complete by: As directed        Discharge Medications   Allergies as of 01/13/2023  Reactions   Carboplatin Itching, Nausea And Vomiting, Other (See Comments)   Flushing   Flecainide Hypertension   CAUSED HEART ISSUES    Debrox [carbamide Peroxide]    Swelling in ear canal.         Medication List     TAKE these medications    Accu-Chek Softclix Lancets lancets Use as instructed   acetaminophen 500 MG tablet Commonly known as: TYLENOL Take 500 mg by mouth every 8 (eight) hours as needed for moderate pain.   albuterol 108 (90 Base) MCG/ACT inhaler Commonly known as: VENTOLIN HFA Inhale 2 puffs into the lungs every 6 (six) hours as needed for wheezing or shortness of breath.   amoxicillin-clavulanate 875-125 MG tablet Commonly known as: AUGMENTIN Take 1 tablet by mouth 2 (two) times daily.   atorvastatin 40 MG tablet Commonly known as: LIPITOR TAKE 1 TABLET EVERY DAY   bisoprolol 10 MG tablet Commonly known as: ZEBETA Take 1 tablet (10 mg total) by mouth daily. What changed:  medication strength how much to take   buPROPion 150 MG 24 hr tablet Commonly known as: Wellbutrin XL Take 1 tablet (150 mg total) by mouth daily.   diazepam 5 MG tablet Commonly known as: VALIUM Take 1 tablet (5 mg total) by mouth every 12 (twelve) hours as needed for anxiety.   digoxin 0.125 MG tablet Commonly  known as: LANOXIN Take 1 tablet (0.125 mg total) by mouth daily.   DULoxetine 60 MG capsule Commonly known as: CYMBALTA TAKE 1 CAPSULE EVERY DAY   Eliquis 5 MG Tabs tablet Generic drug: apixaban TAKE 1 TABLET TWICE DAILY   esomeprazole 40 MG capsule Commonly known as: NEXIUM TAKE 1 CAPSULE (40 MG TOTAL) BY MOUTH 2 (TWO) TIMES DAILY BEFORE A MEAL.   gabapentin 300 MG capsule Commonly known as: NEURONTIN TAKE 2 CAPSULES AT BEDTIME AS NEEDED   glucose blood test strip Use as instructed   levothyroxine 175 MCG tablet Commonly known as: SYNTHROID TAKE 1 TABLET EVERY DAY   lipase/protease/amylase 81191 UNITS Cpep capsule Commonly known as: CREON Take 4 capsules po during each meal. Take 1-2 capsules po during each snack.( up to 2 snacks daily).   polyethylene glycol 17 g packet Commonly known as: MIRALAX / GLYCOLAX Take 17 g by mouth daily as needed.   Stiolto Respimat 2.5-2.5 MCG/ACT Aers Generic drug: Tiotropium Bromide-Olodaterol Inhale 2 puffs into the lungs daily.   sucralfate 1 g tablet Commonly known as: CARAFATE TAKE 1 TABLET FOUR TIMES DAILY WITH MEALS AND AT BEDTIME   Synjardy 5-500 MG Tabs Generic drug: Empagliflozin-metFORMIN HCl Take 1 tablet by mouth in the morning and at bedtime.   tamsulosin 0.4 MG Caps capsule Commonly known as: FLOMAX TAKE 1 CAPSULE EVERY DAY         Follow-up Information     [name], MD. Schedule an appointment as soon as possible for a visit in 1 week(s).   Specialty: Family Medicine Contact information: [address] [place] [address] [phone]         Crist Fat, MD. Schedule an appointment as soon as possible for a visit in 2 day(s).   Specialty: Urology Contact information: [address] [place] [address] [phone]                 Major procedures and Radiology Reports - PLEASE review detailed and final reports thoroughly  -      DG CHEST PORT 1 VIEW  Result Date:  01/09/2023 CLINICAL DATA:  [id number] Acute hypoxic respiratory failure (HCC) [id number] EXAM: PORTABLE CHEST 1 VIEW COMPARISON:  Chest x-ray 10/09/2022 11:23 a.m. FINDINGS: Accessed right chest wall catheter with tip overlying the expected region super cavoatrial junction. Widened mediastinum with component likely due to AP portable technique. Persistent cardiomegaly. The heart and mediastinal contours are unchanged. Prominent hilar vasculature. Atherosclerotic plaque. No focal consolidation. Chronic coarsened markings. No pleural effusion. No pneumothorax. No acute osseous abnormality. IMPRESSION: 1. Widened mediastinum with component likely due to AP portable technique. Recommend repeat PA and lateral x-ray view of the chest for re-evaluation 2. Persistent cardiomegaly with underlying pericardial effusion not excluded. 3. Central venous congestion. 4. Aortic Atherosclerosis (ICD10-I70.0) and Emphysema (ICD10-J43.9). Electronically Signed   By: Tish Frederickson M.D.   On: 01/09/2023 23:40   CT ABDOMEN PELVIS W CONTRAST  Result Date: 01/09/2023 CLINICAL DATA:  Sepsis, abdominal pain, PE suspected, hypoxia, history of lung cancer * Tracking Code: BO * EXAM: CT ANGIOGRAPHY CHEST WITH CONTRAST CT ABDOMEN PELVIS WITH CONTRAST TECHNIQUE: Multidetector CT imaging of the chest was performed using the standard protocol during bolus administration of intravenous contrast. Multiplanar CT image reconstructions and MIPs were obtained to evaluate the vascular anatomy. Multidetector CT imaging of the abdomen and pelvis was performed using the standard protocol following bolus administration of intravenous contrast. RADIATION DOSE REDUCTION: This exam was performed according to the departmental dose-optimization program which includes automated exposure control, adjustment of the mA and/or kV according to patient size and/or use of iterative reconstruction technique. CONTRAST:  75mL OMNIPAQUE IOHEXOL 350 MG/ML SOLN COMPARISON:  CT  chest abdomen pelvis, 08/16/2022 FINDINGS: CT CHEST ANGIOGRAM FINDINGS Cardiovascular: Satisfactory opacification of the pulmonary arteries to the segmental level. No evidence of pulmonary embolism. Cardiomegaly. Three-vessel coronary artery calcifications. No pericardial effusion. Aortic atherosclerosis. Right chest port catheter. Mediastinum/Nodes: Interval enlargement of right paratracheal nodes measuring up to 2.4 x 1.2 cm, previously 1.8 x 0.7 cm (series 8, image 55). Interval enlargement of subcarinal nodes measuring up to 2.6 x 1.7 cm, previously 2.3 x 1.1 cm (series 8, image 89). Thyroid gland, trachea, and esophagus demonstrate no significant findings. Lungs/Pleura: Unchanged post treatment/postoperative appearance of the dependent left lower lobe with dense consolidation and fibrosis about a suture line (series 9, image 81). Severe emphysema. No pleural effusion or pneumothorax. Musculoskeletal: No chest wall abnormality. No acute osseous findings. Review of the MIP images confirms the above findings. CT ABDOMEN PELVIS FINDINGS Hepatobiliary: No solid liver abnormality is seen. Gallstones. Gallbladder wall thickening, or biliary dilatation. Pancreas: Diffusely atrophic pancreas. No pancreatic ductal dilatation or surrounding inflammatory changes. Spleen: Normal in size without significant abnormality. Adrenals/Urinary Tract: Unchanged benign adenoma of the medial limb of the right adrenal gland requiring no specific further follow-up or characterization. (Series 5, image 29). Simple parapelvic and renal cortical cysts, for which no further follow-up or characterization is required. Kidneys are otherwise normal, without renal calculi, solid lesion, or hydronephrosis. Bladder is unremarkable. Stomach/Bowel: Stomach is within normal limits. Appendix appears normal. No evidence of bowel wall thickening, distention, or inflammatory changes. Moderate burden of stool and stool balls throughout the colon and  rectum. Vascular/Lymphatic: No significant vascular findings are present. No enlarged abdominal or pelvic lymph nodes. Reproductive: Mild prostatomegaly.  TURP defect. Other: Small fat containing epigastric hernia (series 5, image 54). Small fat containing bilateral inguinal hernias. No ascites. Musculoskeletal: No acute or significant osseous findings. IMPRESSION: 1. Negative examination for pulmonary embolism. 2. Unchanged post treatment/postoperative appearance of the dependent left lower lobe.  3. Interval enlargement of right paratracheal and subcarinal lymph nodes, nonspecific, possibly reactive although worrisome for nodal metastatic disease. These could be characterized for abnormal metabolic activity by PET-CT on a nonemergent, outpatient basis if desired. 4. No acute CT findings of the abdomen or pelvis. 5. Cholelithiasis without evidence of acute cholecystitis. 6. Severe emphysema. 7. Coronary artery disease. Aortic Atherosclerosis (ICD10-I70.0) and Emphysema (ICD10-J43.9). Electronically Signed   By: Jearld Lesch M.D.   On: 01/09/2023 13:01   CT Angio Chest PE W and/or Wo Contrast  Result Date: 01/09/2023 CLINICAL DATA:  Sepsis, abdominal pain, PE suspected, hypoxia, history of lung cancer * Tracking Code: BO * EXAM: CT ANGIOGRAPHY CHEST WITH CONTRAST CT ABDOMEN PELVIS WITH CONTRAST TECHNIQUE: Multidetector CT imaging of the chest was performed using the standard protocol during bolus administration of intravenous contrast. Multiplanar CT image reconstructions and MIPs were obtained to evaluate the vascular anatomy. Multidetector CT imaging of the abdomen and pelvis was performed using the standard protocol following bolus administration of intravenous contrast. RADIATION DOSE REDUCTION: This exam was performed according to the departmental dose-optimization program which includes automated exposure control, adjustment of the mA and/or kV according to patient size and/or use of iterative  reconstruction technique. CONTRAST:  75mL OMNIPAQUE IOHEXOL 350 MG/ML SOLN COMPARISON:  CT chest abdomen pelvis, 08/16/2022 FINDINGS: CT CHEST ANGIOGRAM FINDINGS Cardiovascular: Satisfactory opacification of the pulmonary arteries to the segmental level. No evidence of pulmonary embolism. Cardiomegaly. Three-vessel coronary artery calcifications. No pericardial effusion. Aortic atherosclerosis. Right chest port catheter. Mediastinum/Nodes: Interval enlargement of right paratracheal nodes measuring up to 2.4 x 1.2 cm, previously 1.8 x 0.7 cm (series 8, image 55). Interval enlargement of subcarinal nodes measuring up to 2.6 x 1.7 cm, previously 2.3 x 1.1 cm (series 8, image 89). Thyroid gland, trachea, and esophagus demonstrate no significant findings. Lungs/Pleura: Unchanged post treatment/postoperative appearance of the dependent left lower lobe with dense consolidation and fibrosis about a suture line (series 9, image 81). Severe emphysema. No pleural effusion or pneumothorax. Musculoskeletal: No chest wall abnormality. No acute osseous findings. Review of the MIP images confirms the above findings. CT ABDOMEN PELVIS FINDINGS Hepatobiliary: No solid liver abnormality is seen. Gallstones. Gallbladder wall thickening, or biliary dilatation. Pancreas: Diffusely atrophic pancreas. No pancreatic ductal dilatation or surrounding inflammatory changes. Spleen: Normal in size without significant abnormality. Adrenals/Urinary Tract: Unchanged benign adenoma of the medial limb of the right adrenal gland requiring no specific further follow-up or characterization. (Series 5, image 29). Simple parapelvic and renal cortical cysts, for which no further follow-up or characterization is required. Kidneys are otherwise normal, without renal calculi, solid lesion, or hydronephrosis. Bladder is unremarkable. Stomach/Bowel: Stomach is within normal limits. Appendix appears normal. No evidence of bowel wall thickening, distention, or  inflammatory changes. Moderate burden of stool and stool balls throughout the colon and rectum. Vascular/Lymphatic: No significant vascular findings are present. No enlarged abdominal or pelvic lymph nodes. Reproductive: Mild prostatomegaly.  TURP defect. Other: Small fat containing epigastric hernia (series 5, image 54). Small fat containing bilateral inguinal hernias. No ascites. Musculoskeletal: No acute or significant osseous findings. IMPRESSION: 1. Negative examination for pulmonary embolism. 2. Unchanged post treatment/postoperative appearance of the dependent left lower lobe. 3. Interval enlargement of right paratracheal and subcarinal lymph nodes, nonspecific, possibly reactive although worrisome for nodal metastatic disease. These could be characterized for abnormal metabolic activity by PET-CT on a nonemergent, outpatient basis if desired. 4. No acute CT findings of the abdomen or pelvis. 5. Cholelithiasis without evidence of acute  cholecystitis. 6. Severe emphysema. 7. Coronary artery disease. Aortic Atherosclerosis (ICD10-I70.0) and Emphysema (ICD10-J43.9). Electronically Signed   By: Jearld Lesch M.D.   On: 01/09/2023 13:01   DG Chest Portable 1 View  Result Date: 01/09/2023 CLINICAL DATA:  sepsis.  Shortness of breath.  Fall. EXAM: PORTABLE CHEST 1 VIEW COMPARISON:  04/04/2017. FINDINGS: Bilateral lungs appear hyperlucent with coarse bronchovascular markings, in keeping with COPD. There are probable atelectatic changes at the left lung base. Bilateral lungs otherwise appear clear. No dense consolidation or lung collapse. Subtle blunting of left lateral costophrenic angle may be due to trace pleural effusion versus overlying lung parenchymal opacities. Right lateral costophrenic angle is clear. Mildly enlarged cardio-mediastinal silhouette, which is accentuated by AP technique. No acute osseous abnormalities. The soft tissues are within normal limits. Right-sided CT Port-A-Cath is seen with its  tip overlying the midportion of superior vena cava. IMPRESSION: 1. COPD.  Probable atelectatic changes at the left lung base. 2. Subtle blunting of the left lateral costophrenic angle may be due to trace pleural effusion versus overlying lung parenchymal opacities. 3. Mild cardiomegaly. Electronically Signed   By: Jules Schick M.D.   On: 01/09/2023 12:18    Micro Results    Recent Results (from the past 240 hour(s))  SARS Coronavirus 2 by RT PCR (hospital order, performed in [facility] hospital lab) *cepheid single result test* Line, Central     Status: None   Collection Time: 01/09/23 10:28 AM   Specimen: Line, Central; Nasal Swab  Result Value Ref Range Status   SARS Coronavirus 2 by RT PCR NEGATIVE NEGATIVE Final    Comment: Performed at [facility], [address], [place], [place] [address]  Blood culture (routine x 2)     Status: None (Preliminary result)   Collection Time: 01/09/23 10:28 AM   Specimen: BLOOD  Result Value Ref Range Status   Specimen Description BLOOD CENTRAL LINE  Final   Special Requests   Final    BOTTLES DRAWN AEROBIC AND ANAEROBIC Blood Culture adequate volume   Culture  Setup Time   Final    GRAM NEGATIVE RODS AEROBIC BOTTLE ONLY CRITICAL VALUE NOTED.  VALUE IS CONSISTENT WITH PREVIOUSLY REPORTED AND CALLED VALUE. Performed at [facility], [address], [place], [place] [address]    Culture GRAM NEGATIVE RODS  Final   Report Status PENDING  Incomplete  Blood culture (routine x 2)     Status: Abnormal (Preliminary result)   Collection Time: 01/09/23 10:32 AM   Specimen: BLOOD LEFT ARM  Result Value Ref Range Status   Specimen Description BLOOD LEFT ARM  Final   Special Requests   Final    BOTTLES DRAWN AEROBIC AND ANAEROBIC Blood Culture adequate volume   Culture  Setup Time   Final    GRAM NEGATIVE RODS ANAEROBIC BOTTLE ONLY CRITICAL RESULT CALLED TO, READ BACK BY AND VERIFIED WITH: PHARMD B.AGEE AT 1326 ON 01/11/2023 BY  T.SAAD. Performed at [facility], [address] [place], [place] [address]    Culture KLEBSIELLA PNEUMONIAE (A)  Final   Report Status PENDING  Incomplete   Organism ID, Bacteria KLEBSIELLA PNEUMONIAE  Final      Susceptibility   Klebsiella pneumoniae - MIC*    AMPICILLIN >=32 RESISTANT Resistant     CEFEPIME <=0.12 SENSITIVE Sensitive     CEFTAZIDIME <=1 SENSITIVE Sensitive     CEFTRIAXONE <=0.25 SENSITIVE Sensitive     CIPROFLOXACIN <=0.25 SENSITIVE Sensitive  GENTAMICIN <=1 SENSITIVE Sensitive     IMIPENEM <=0.25 SENSITIVE Sensitive     TRIMETH/SULFA <=20 SENSITIVE Sensitive     AMPICILLIN/SULBACTAM 4 SENSITIVE Sensitive     PIP/TAZO <=4 SENSITIVE Sensitive ug/mL    * KLEBSIELLA PNEUMONIAE  Blood Culture ID Panel (Reflexed)     Status: Abnormal   Collection Time: 01/09/23 10:32 AM  Result Value Ref Range Status   Enterococcus faecalis NOT DETECTED NOT DETECTED Final   Enterococcus Faecium NOT DETECTED NOT DETECTED Final   Listeria monocytogenes NOT DETECTED NOT DETECTED Final   Staphylococcus species NOT DETECTED NOT DETECTED Final   Staphylococcus aureus (BCID) NOT DETECTED NOT DETECTED Final   Staphylococcus epidermidis NOT DETECTED NOT DETECTED Final   Staphylococcus lugdunensis NOT DETECTED NOT DETECTED Final   Streptococcus species NOT DETECTED NOT DETECTED Final   Streptococcus agalactiae NOT DETECTED NOT DETECTED Final   Streptococcus pneumoniae NOT DETECTED NOT DETECTED Final   Streptococcus pyogenes NOT DETECTED NOT DETECTED Final   A.calcoaceticus-baumannii NOT DETECTED NOT DETECTED Final   Bacteroides fragilis NOT DETECTED NOT DETECTED Final   Enterobacterales DETECTED (A) NOT DETECTED Final    Comment: Enterobacterales represent a large order of gram negative bacteria, not a single organism. CRITICAL RESULT CALLED TO, READ BACK BY AND VERIFIED WITH: PHARMD B.AGEE AT 1326 ON 01/11/2023 BY T.SAAD.    Enterobacter cloacae complex NOT DETECTED NOT  DETECTED Final   Escherichia coli NOT DETECTED NOT DETECTED Final   Klebsiella aerogenes NOT DETECTED NOT DETECTED Final   Klebsiella oxytoca NOT DETECTED NOT DETECTED Final   Klebsiella pneumoniae DETECTED (A) NOT DETECTED Final    Comment: CRITICAL RESULT CALLED TO, READ BACK BY AND VERIFIED WITH: PHARMD B.AGEE AT 1326 ON 01/11/2023 BY T.SAAD.    Proteus species NOT DETECTED NOT DETECTED Final   Salmonella species NOT DETECTED NOT DETECTED Final   Serratia marcescens NOT DETECTED NOT DETECTED Final   Haemophilus influenzae NOT DETECTED NOT DETECTED Final   Neisseria meningitidis NOT DETECTED NOT DETECTED Final   Pseudomonas aeruginosa NOT DETECTED NOT DETECTED Final   Stenotrophomonas maltophilia NOT DETECTED NOT DETECTED Final   Candida albicans NOT DETECTED NOT DETECTED Final   Candida auris NOT DETECTED NOT DETECTED Final   Candida glabrata NOT DETECTED NOT DETECTED Final   Candida krusei NOT DETECTED NOT DETECTED Final   Candida parapsilosis NOT DETECTED NOT DETECTED Final   Candida tropicalis NOT DETECTED NOT DETECTED Final   Cryptococcus neoformans/gattii NOT DETECTED NOT DETECTED Final   CTX-M ESBL NOT DETECTED NOT DETECTED Final   Carbapenem resistance IMP NOT DETECTED NOT DETECTED Final   Carbapenem resistance KPC NOT DETECTED NOT DETECTED Final   Carbapenem resistance NDM NOT DETECTED NOT DETECTED Final   Carbapenem resist OXA 48 LIKE NOT DETECTED NOT DETECTED Final   Carbapenem resistance VIM NOT DETECTED NOT DETECTED Final    Comment: Performed at [facility], [address] [place], [place] [address]  Urine Culture     Status: Abnormal   Collection Time: 01/09/23 11:20 AM   Specimen: Urine, Random  Result Value Ref Range Status   Specimen Description URINE, RANDOM  Final   Special Requests   Final    NONE Reflexed from [id number] Performed at [facility], [address], [place], [place] [address]    Culture (A)  Final    >=100,000 COLONIES/mL  KLEBSIELLA PNEUMONIAE >=100,000 COLONIES/mL ENTEROCOCCUS FAECALIS    Report Status 01/11/2023 FINAL  Final   Organism ID, Bacteria KLEBSIELLA PNEUMONIAE (  A)  Final   Organism ID, Bacteria ENTEROCOCCUS FAECALIS (A)  Final      Susceptibility   Enterococcus faecalis - MIC*    AMPICILLIN <=2 SENSITIVE Sensitive     NITROFURANTOIN <=16 SENSITIVE Sensitive     VANCOMYCIN 2 SENSITIVE Sensitive     * >=100,000 COLONIES/mL ENTEROCOCCUS FAECALIS   Klebsiella pneumoniae - MIC*    AMPICILLIN >=32 RESISTANT Resistant     CEFAZOLIN <=4 SENSITIVE Sensitive     CEFEPIME <=0.12 SENSITIVE Sensitive     CEFTRIAXONE <=0.25 SENSITIVE Sensitive     CIPROFLOXACIN <=0.25 SENSITIVE Sensitive     GENTAMICIN <=1 SENSITIVE Sensitive     IMIPENEM <=0.25 SENSITIVE Sensitive     NITROFURANTOIN 64 INTERMEDIATE Intermediate     TRIMETH/SULFA <=20 SENSITIVE Sensitive     AMPICILLIN/SULBACTAM 8 SENSITIVE Sensitive     PIP/TAZO <=4 SENSITIVE Sensitive ug/mL    * >=100,000 COLONIES/mL KLEBSIELLA PNEUMONIAE  MRSA Next Gen by PCR, Nasal     Status: None   Collection Time: 01/10/23  6:09 AM   Specimen: Nasal Mucosa; Nasal Swab  Result Value Ref Range Status   MRSA by PCR Next Gen NOT DETECTED NOT DETECTED Final    Comment: (NOTE) The GeneXpert MRSA Assay (FDA approved for NASAL specimens only), is one component of a comprehensive MRSA colonization surveillance program. It is not intended to diagnose MRSA infection nor to guide or monitor treatment for MRSA infections. Test performance is not FDA approved in patients less than 49 years old. Performed at [facility], [address] [place], [place] [address]     Today   Subjective    Ahmon Gair today has no headache,no chest abdominal pain,no new weakness tingling or numbness, feels much better wants to go home today.    Objective   Blood pressure 122/67, pulse 85, temperature 98.4 F (36.9 C), temperature source Axillary, resp. rate 19,  height 6\' 1"  (1.854 m), weight 101.6 kg, SpO2 98%.   Intake/Output Summary (Last 24 hours) at 01/13/2023 0921 Last data filed at 01/13/2023 0531 Gross per 24 hour  Intake 920 ml  Output 400 ml  Net 520 ml    Exam  Awake Alert, No new F.N deficits,    [place].AT,PERRAL Supple Neck,   Symmetrical Chest wall movement, Good air movement bilaterally, CTAB RRR,No Gallops,   +ve B.Sounds, Abd Soft, Non tender,  No Cyanosis, Clubbing or edema    Data Review   Recent Labs  Lab 01/09/23 1028 01/09/23 1101 01/10/23 0303 01/10/23 2118 01/11/23 0357 01/12/23 0304 01/13/23 0759  WBC 12.6*  --  15.6*  --  8.8 5.5 5.4  HGB 16.1   < > 13.3 12.2* 12.5* 12.2* 13.3  HCT 47.0   < > 36.3* 34.5* 36.5* 34.7* 37.7*  PLT 135*  --  114*  --  112* 113* 145*  MCV 94.0  --  94.0  --  92.2 90.8 92.9  MCH 32.2  --  34.5*  --  31.6 31.9 32.8  MCHC 34.3  --  36.6*  --  34.2 35.2 35.3  RDW 13.5  --  14.5  --  13.8 13.8 13.7  LYMPHSABS 1.1  --   --   --   --  1.2 1.3  MONOABS 0.1  --   --   --   --  0.6 0.6  EOSABS 0.0  --   --   --   --  0.1 0.1  BASOSABS 0.0  --   --   --   --  0.0 0.0   < > = values in this interval not displayed.    Recent Labs  Lab 01/09/23 1028 01/09/23 1101 01/09/23 1536 01/09/23 2159 01/09/23 2223 01/10/23 0037 01/10/23 0303 01/10/23 0454 01/11/23 0353 01/11/23 0357 01/12/23 0304 01/12/23 1023 01/13/23 0759  NA 140   < >  --   --  136  --  135  --   --  137 139  --  138  K 4.5   < >  --   --  4.0  --  3.4*  --   --  3.7 3.7  --  4.4  CL 105  --   --   --   --   --  103  --   --  105 108  --  109  CO2 18*  --   --   --   --   --  20*  --   --  24 25  --  26  ANIONGAP 17*  --   --   --   --   --  12  --   --  8 6  --  3*  GLUCOSE 220*  --   --   --   --   --  210*  --   --  166* 172*  --  147*  BUN 18  --   --   --   --   --  21  --   --  19 11  --  5*  CREATININE 1.39*  --   --   --   --   --  1.19  --   --  0.99 0.82  --  0.67  AST 27  --   --   --   --   --  30   --   --   --   --   --   --   ALT 22  --   --   --   --   --  23  --   --   --   --   --   --   ALKPHOS 74  --   --   --   --   --  56  --   --   --   --   --   --   BILITOT 0.7  --   --   --   --   --  0.9  --   --   --   --   --   --   ALBUMIN 4.0  --   --   --   --   --  3.2*  --   --   --   --   --   --   CRP  --   --   --   --   --   --   --   --  11.2*  --  7.3*  --   --   PROCALCITON  --   --   --   --   --   --  37.76  --  33.49  --  16.05  --   --   LATICACIDVEN  --    < > 1.8 7.7*  --  1.7 1.4 1.7  --   --   --   --   --   INR 1.0  --   --   --   --   --  1.3*  --   --   --   --   --   --   TSH  --   --   --   --   --   --   --   --   --   --   --  2.871  --   BNP 127.2*  --   --   --   --   --   --   --   --   --  253.3*  --   --   MG 1.6*  --   --   --   --   --  2.1  --   --   --  2.0  --  1.9  CALCIUM 9.4  --   --   --   --   --  8.6*  --   --  8.1* 8.2*  --  8.3*   < > = values in this interval not displayed.    Total Time in preparing paper work, data evaluation and todays exam - 35 minutes  Signature  -    Susa Raring M.D on 01/13/2023 at 9:21 AM   -  To page go to [email or web address]

## 2023-01-13 NOTE — Discharge Instructions (Addendum)
Follow with Primary MD [name], MD in 7 days follow-up with your Urologist within the next 2 to 3 days of discharge.  Follow-up with your pulmonologist and review your CT chest findings within 2 weeks.  Get CBC, CMP,  Magnesium-  checked next visit with your primary MD    Activity: As tolerated with Full fall precautions use walker/cane & assistance as needed  Disposition Home   Diet: Heart Healthy low carbohydrate diet, check CBGs q. [facility]  Special Instructions: If you have smoked or chewed Tobacco  in the last 2 yrs please stop smoking, stop any regular Alcohol  and or any Recreational drug use.  On your next visit with your primary care physician please Get Medicines reviewed and adjusted.  Please request your Prim.MD to go over all Hospital Tests and Procedure/Radiological results at the follow up, please get all Hospital records sent to your Prim MD by signing hospital release before you go home.  If you experience worsening of your admission symptoms, develop shortness of breath, life threatening emergency, suicidal or homicidal thoughts you must seek medical attention immediately by calling 911 or calling your MD immediately  if symptoms less severe.  You Must read complete instructions/literature along with all the possible adverse reactions/side effects for all the Medicines you take and that have been prescribed to you. Take any new Medicines after you have completely understood and accpet all the possible adverse reactions/side effects.   Do not drive when taking Pain medications.  Do not take more than prescribed Pain, Sleep and Anxiety Medications

## 2023-01-13 NOTE — Progress Notes (Signed)
Arrived in room to find port needle on sink counter. Pt states that he pulled out accidentally while removing telemetry stickers. Reported slight bleeding at time of deaccessing, but stopped now. On assessment, site is intact, slightly pink, not bleeding. Pt denies any pain. Placed dressing, provider aware.

## 2023-01-14 ENCOUNTER — Telehealth: Payer: Self-pay

## 2023-01-14 NOTE — Transitions of Care (Post Inpatient/ED Visit) (Signed)
01/14/2023  Name: Victor Huber MRN: [id number] DOB: 07-20-1950  Today's TOC FU Call Status: Today's TOC FU Call Status:: Successful TOC FU Call Completed TOC FU Call Complete Date: 01/14/23 (Call completed with wife.) Patient's Name and Date of Birth confirmed.  Transition Care Management Follow-up Telephone Call Date of Discharge: 01/13/23 Discharge Facility: [facility]) Type of Discharge: Inpatient Admission Primary Inpatient Discharge Diagnosis:: "A-fib w/ RVR" How have you been since you were released from the hospital?: Better (Per wife "tired which is to be expected but doing ok", rested fairly well last night-freq urination during night, denies pain, appetite dcreased-drinking nutritonal shakes, pt having some diarrhea due to possible abxs-states "lesser" than when in hosp) Any questions or concerns?: Yes Patient Questions/Concerns:: Wife wanting to know if abx shoudl be taken with food. Patient Questions/Concerns Addressed: Other: (Discussed with wife that babx should be given with food  to help prevent stomach upset and reviewed possible SEs of med with wife-she voiced understanding)  Items Reviewed: Did you receive and understand the discharge instructions provided?: Yes Medications obtained,verified, and reconciled?: Yes (Medications Reviewed) Any new allergies since your discharge?: No Dietary orders reviewed?: Yes Type of Diet Ordered:: low salt/heart healthy/carb modified Do you have support at home?: Yes People in Home: spouse Name of Support/Comfort Primary Source: [name]  Medications Reviewed Today: Medications Reviewed Today     Reviewed by [name], RN (Registered Nurse) on 01/14/23 at 1421  Med List Status: <None>   Medication Order Taking? Sig Documenting Provider Last Dose Status Informant  Accu-Chek Softclix Lancets lancets [id number]  Use as instructed [name], MD  Active Spouse/Significant Other  acetaminophen (TYLENOL)  500 MG tablet [id number] Yes Take 500 mg by mouth every 8 (eight) hours as needed for moderate pain. [provider] Taking Active Spouse/Significant Other  albuterol (VENTOLIN HFA) 108 (90 Base) MCG/ACT inhaler [id number] Yes Inhale 2 puffs into the lungs every 6 (six) hours as needed for wheezing or shortness of breath. Leslye Peer, MD Taking Active Spouse/Significant Other  amoxicillin-clavulanate (AUGMENTIN) 875-125 MG tablet [id number] Yes Take 1 tablet by mouth 2 (two) times daily. [name], MD Taking Active   atorvastatin (LIPITOR) 40 MG tablet [id number] Yes TAKE 1 TABLET EVERY DAY [name], MD Taking Active Spouse/Significant Other  bisoprolol (ZEBETA) 10 MG tablet [id number] Yes Take 1 tablet (10 mg total) by mouth daily. [name], MD Taking Active   buPROPion (WELLBUTRIN XL) 150 MG 24 hr tablet [id number] Yes Take 1 tablet (150 mg total) by mouth daily. [name], MD Taking Active Spouse/Significant Other  diazepam (VALIUM) 5 MG tablet [id number] Yes Take 1 tablet (5 mg total) by mouth every 12 (twelve) hours as needed for anxiety. [name], MD Taking Active Spouse/Significant Other  digoxin (LANOXIN) 0.125 MG tablet [id number] Yes Take 1 tablet (0.125 mg total) by mouth daily. Chilton Si, MD Taking Active Spouse/Significant Other  DULoxetine (CYMBALTA) 60 MG capsule [id number] Yes TAKE 1 CAPSULE EVERY DAY [name], MD Taking Active Spouse/Significant Other  ELIQUIS 5 MG TABS tablet [id number] Yes TAKE 1 TABLET TWICE DAILY Chilton Si, MD Taking Active Spouse/Significant Other  Empagliflozin-metFORMIN HCl (SYNJARDY) 5-500 MG TABS [id number] Yes Take 1 tablet by mouth in the morning and at bedtime. [name], MD Taking Active Spouse/Significant Other  esomeprazole (NEXIUM) 40 MG capsule [id number] Yes TAKE 1 CAPSULE (40 MG TOTAL) BY MOUTH 2 (TWO) TIMES DAILY BEFORE A MEAL. [name], [name], MD  Taking Active  Spouse/Significant Other  gabapentin (NEURONTIN) 300 MG capsule [id number] Yes TAKE 2 CAPSULES AT BEDTIME AS NEEDED [name], MD Taking Active Spouse/Significant Other  glucose blood test strip [id number] Yes Use as instructed [name], MD Taking Active Spouse/Significant Other  levothyroxine (SYNTHROID) 175 MCG tablet [id number] Yes TAKE 1 TABLET EVERY DAY [name], MD Taking Active Spouse/Significant Other  lipase/protease/amylase (CREON) 36000 UNITS CPEP capsule [id number] Yes Take 4 capsules po during each meal. Take 1-2 capsules po during each snack.( up to 2 snacks daily). [name], [name], MD Taking Active Spouse/Significant Other  polyethylene glycol (MIRALAX / GLYCOLAX) 17 g packet [id number]  Take 17 g by mouth daily as needed. [name], MD  Active   sucralfate (CARAFATE) 1 g tablet [id number] Yes TAKE 1 TABLET FOUR TIMES DAILY WITH MEALS AND AT BEDTIME [name], [name], MD Taking Active Spouse/Significant Other  tamsulosin (FLOMAX) 0.4 MG CAPS capsule [id number] Yes TAKE 1 CAPSULE EVERY DAY [name], MD Taking Active Spouse/Significant Other  Tiotropium Bromide-Olodaterol (STIOLTO RESPIMAT) 2.5-2.5 MCG/ACT AERS [id number] Yes Inhale 2 puffs into the lungs daily. Leslye Peer, MD Taking Active Spouse/Significant Other            Home Care and Equipment/Supplies: Were Home Health Services Ordered?: NA Any new equipment or medical supplies ordered?: NA  Functional Questionnaire: Do you need assistance with bathing/showering or dressing?: No Do you need assistance with meal preparation?: Yes Do you need assistance with eating?: No Do you have difficulty maintaining continence: No Do you need assistance with getting out of bed/getting out of a chair/moving?: No Do you have difficulty managing or taking your medications?: No  Follow up appointments reviewed: PCP Follow-up appointment confirmed?: Yes Date of PCP follow-up appointment?:  01/21/23 Follow-up Provider: Dr. Jimmey Ralph Specialist [facility] Follow-up appointment confirmed?: Yes Date of Specialist follow-up appointment?: 01/05/23 Follow-Up Specialty Provider:: Dr. Herrick-urology Reason Specialist Follow-Up Not Confirmed: Patient has Specialist Provider Number and will Call for Appointment (reviewed with wife per d/c summary pt needs to make appt with lung MD as well and wife will call office as well as cardiology) Do you need transportation to your follow-up appointment?: No Do you understand care options if your condition(s) worsen?: Yes-patient verbalized understanding  SDOH Interventions Today    Flowsheet Row Most Recent Value  SDOH Interventions   Food Insecurity Interventions Intervention Not Indicated  Transportation Interventions Intervention Not Indicated      TOC Interventions Today    Flowsheet Row Most Recent Value  TOC Interventions   TOC Interventions Discussed/Reviewed TOC Interventions Discussed, Arranged PCP follow up less than 12 days/Care Guide scheduled      Interventions Today    Flowsheet Row Most Recent Value  Chronic Disease   Chronic disease during today's visit Atrial Fibrillation (AFib), Diabetes  General Interventions   General Interventions Discussed/Reviewed General Interventions Discussed, Doctor Visits, Referral to Nurse, Durable Medical Equipment (DME)  Denton Lank declined-provided with RN CM contact info-advised to call for any questions or concerns]  Doctor Visits Discussed/Reviewed Doctor Visits Discussed, PCP, Specialist  Durable Medical Equipment (DME) Glucomoter  [wife voices they have not checked cbgs since returning home but will do so]  PCP/Specialist Visits Compliance with follow-up visit  Education Interventions   Education Provided Provided Education  Provided Verbal Education On Nutrition, Medication, When to see the doctor, Other  [sx mgmt, bowel mgmt]  Nutrition Interventions   Nutrition Discussed/Reviewed  Nutrition Discussed, Fluid intake, Supplemental nutrition  Pharmacy Interventions  Pharmacy Dicussed/Reviewed Pharmacy Topics Discussed, Medications and their functions  Safety Interventions   Safety Discussed/Reviewed Safety Discussed       Antionette Fairy, RN,BSN,CCM RN Care Manager Transitions of Care  [facility]  Direct Phone: [phone] Toll Free: [phone] Fax: [phone]

## 2023-01-15 LAB — CULTURE, BLOOD (ROUTINE X 2): Special Requests: ADEQUATE

## 2023-01-16 ENCOUNTER — Other Ambulatory Visit: Payer: Self-pay | Admitting: Family Medicine

## 2023-01-21 ENCOUNTER — Ambulatory Visit: Payer: Medicare HMO | Admitting: Family Medicine

## 2023-01-21 ENCOUNTER — Encounter: Payer: Self-pay | Admitting: Family Medicine

## 2023-01-21 ENCOUNTER — Encounter: Payer: Self-pay | Admitting: Internal Medicine

## 2023-01-21 ENCOUNTER — Other Ambulatory Visit (HOSPITAL_BASED_OUTPATIENT_CLINIC_OR_DEPARTMENT_OTHER): Payer: Self-pay

## 2023-01-21 VITALS — BP 132/60 | HR 100 | Temp 97.8°F | Ht 73.0 in | Wt 210.0 lb

## 2023-01-21 DIAGNOSIS — Z7984 Long term (current) use of oral hypoglycemic drugs: Secondary | ICD-10-CM | POA: Diagnosis not present

## 2023-01-21 DIAGNOSIS — E1169 Type 2 diabetes mellitus with other specified complication: Secondary | ICD-10-CM

## 2023-01-21 DIAGNOSIS — N390 Urinary tract infection, site not specified: Secondary | ICD-10-CM | POA: Diagnosis not present

## 2023-01-21 DIAGNOSIS — J441 Chronic obstructive pulmonary disease with (acute) exacerbation: Secondary | ICD-10-CM | POA: Diagnosis not present

## 2023-01-21 DIAGNOSIS — Z8619 Personal history of other infectious and parasitic diseases: Secondary | ICD-10-CM | POA: Diagnosis not present

## 2023-01-21 DIAGNOSIS — D6859 Other primary thrombophilia: Secondary | ICD-10-CM | POA: Diagnosis not present

## 2023-01-21 DIAGNOSIS — R9389 Abnormal findings on diagnostic imaging of other specified body structures: Secondary | ICD-10-CM | POA: Diagnosis not present

## 2023-01-21 DIAGNOSIS — A419 Sepsis, unspecified organism: Secondary | ICD-10-CM

## 2023-01-21 DIAGNOSIS — I482 Chronic atrial fibrillation, unspecified: Secondary | ICD-10-CM | POA: Diagnosis not present

## 2023-01-21 DIAGNOSIS — I5022 Chronic systolic (congestive) heart failure: Secondary | ICD-10-CM | POA: Diagnosis not present

## 2023-01-21 LAB — COMPREHENSIVE METABOLIC PANEL
ALT: 21 U/L (ref 0–53)
AST: 15 U/L (ref 0–37)
Albumin: 4.4 g/dL (ref 3.5–5.2)
Alkaline Phosphatase: 85 U/L (ref 39–117)
BUN: 21 mg/dL (ref 6–23)
CO2: 30 meq/L (ref 19–32)
Calcium: 9.9 mg/dL (ref 8.4–10.5)
Chloride: 100 meq/L (ref 96–112)
Creatinine, Ser: 0.94 mg/dL (ref 0.40–1.50)
GFR: 79.97 mL/min (ref >60.00)
Glucose, Bld: 151 mg/dL — ABNORMAL HIGH (ref 70–99)
Potassium: 5 meq/L (ref 3.5–5.1)
Sodium: 137 meq/L (ref 135–145)
Total Bilirubin: 0.6 mg/dL (ref 0.2–1.2)
Total Protein: 7.2 g/dL (ref 6.0–8.3)

## 2023-01-21 LAB — CBC
HCT: 48 % (ref 39.0–52.0)
Hemoglobin: 16 g/dL (ref 13.0–17.0)
MCHC: 33.4 g/dL (ref 30.0–36.0)
MCV: 96 fL (ref 78.0–100.0)
Platelets: 250 10*3/uL (ref 150.0–400.0)
RBC: 5 Mil/uL (ref 4.22–5.81)
RDW: 14.5 % (ref 11.5–15.5)
WBC: 10.2 10*3/uL (ref 4.0–10.5)

## 2023-01-21 MED ORDER — FLUAD 0.5 ML IM SUSY
0.5000 mL | PREFILLED_SYRINGE | Freq: Once | INTRAMUSCULAR | 0 refills | Status: AC
  Filled 2023-01-21: qty 0.5, 1d supply, fill #0

## 2023-01-21 MED ORDER — COMIRNATY 30 MCG/0.3ML IM SUSY
0.3000 mL | PREFILLED_SYRINGE | Freq: Once | INTRAMUSCULAR | 0 refills | Status: AC
  Filled 2023-01-21: qty 0.3, 1d supply, fill #0

## 2023-01-21 NOTE — Progress Notes (Signed)
Chief Complaint:  Victor Huber is a 72 y.o. male who presents today for a TCM visit.  Assessment/Plan:  New/Acute Problems: Sepsis secondary to urinary tract infection Resolved. He will finish his course of antibiotics. No signs of recurrence.  HE will let us know if he develops any recurrent symptoms.   Widened Mediastinum  Incidentally found during hospitalization and CT scan showed interval enlargement in mediastinal lymph nodes. Concern for reactive process though radiology noted it may be related to metastatic activity. He does have stage 4 lung cancer and follows with oncology routinely. He has follow up scheduled in a few fweeks. We discussed repeating scan however he will be getting CT scan in a couple of weeks and he would like to hold off on any additional imaging orders at this time.    Chronic Problems Addressed Today: T2DM (type 2 diabetes mellitus) (HCC) Too early to recheck A1c today. He will continue synjardy 5-500 twice daily. Will recheck next office visit.    Preventative healthcare - he will get flu and COVID shot at the pharmacy.     Subjective:  HPI:  Summary of Hospital admission: Reason for admission: Sepsis secondary to UTI Date of admission: 01/09/2023 Date of discharge: 01/13/2023 Date of Interactive contact: 01/14/2023 Summary of Hospital course: Patient presented to the ED with 1 day of fevers, chills, shortness of breath, and urinary frequency. HE had been following with urology and recently had TURP. He was admitted for sepsis and started on IV antibiotics. HE was noted to have a blood culture positive for klebsiella and antibiotics were adjusted to unasyn and then treansitioned to Augmentin at the time of discharge. He was discharged home in stable condition.While admitted he was incidentally found to have wide mediastinum. CT scan showed interval enlargement of lymphnodes and radiology recommended follow up CT as an outpatient.   Interim  history:  He has been home for the last week. He is slowly improving. He still feeling Some fatigue but this is improving.  He has been try to stay well-hydrated.  He has been compliant with his medications and has 1 day of antibiotics left.  ROS: Per HPI, otherwise a complete review of systems was negative.   PMH:  The following were reviewed and entered/updated in epic: Past Medical History:  Diagnosis Date   Adenocarcinoma of left lung, stage 3 (HCC) 08/15/2016   Allergy    Anxiety    Arthritis    Atrial fibrillation (HCC) 08/15/2016   Atrial fibrillation (HCC)    Cancer, metastatic to bone [facility])    lung   Cataract    Waiting to schedule bilateral cataract surgery   CHF (congestive heart failure) (HCC) 01/04/2022   COPD (chronic obstructive pulmonary disease) (HCC) 08/15/2016   Depression    Diabetes mellitus, type 2 (HCC)    Dysphagia    Dysrhythmia    a-fib   GERD (gastroesophageal reflux disease)    Heart murmur    atenlol for A Fib/Eliquis   History of chemotherapy    History of radiation therapy    Hyperlipidemia    Hypertension    Hypothyroid 08/15/2016   Longstanding persistent atrial fibrillation (HCC) 08/29/2016   Pathologic fracture    left femur   Pneumonitis    S/P TURP 08/15/2016   T2DM (type 2 diabetes mellitus) (HCC) 05/03/2022   Wears glasses    Wears hearing aid in both ears    Patient Active Problem List   Diagnosis Date Noted  Hypomagnesemia 01/09/2023   Dysphagia 05/03/2022   T2DM (type 2 diabetes mellitus) (HCC) 05/03/2022   CHF (congestive heart failure) (HCC) 01/04/2022   Depression, major, single episode, moderate (HCC) 09/05/2021   Paresthesia 09/05/2021   Cough 05/18/2020   Esophagitis 01/31/2020   Exocrine pancreatic insufficiency 01/31/2020   Chronic pancreatitis (HCC) 01/31/2020   Dyspnea 09/09/2019   Slow transit constipation 08/17/2019   BPH s/p TURP 03/10/2019   Chronic low back pain 02/03/2019   Port-A-Cath in place  08/22/2017   Adenocarcinoma of left lung, stage 4 (HCC) 02/26/2017   Malignant neoplasm metastatic to bone (HCC) 01/24/2017   Centrilobular emphysema (HCC) 09/26/2016   Hyperlipidemia 09/26/2016   Anxiety disorder 09/26/2016   Chronic atrial fibrillation (HCC) 08/15/2016   Chronic obstructive pulmonary disease (HCC) 08/15/2016   Hypothyroid 08/15/2016   Past Surgical History:  Procedure Laterality Date   BIOPSY  12/21/2019   Procedure: BIOPSY;  Surgeon: Lemar Lofty., MD;  Location: [facility] ENDOSCOPY;  Service: Gastroenterology;;   BIOPSY  05/23/2020   Procedure: BIOPSY;  Surgeon: Lemar Lofty., MD;  Location: [facility] ENDOSCOPY;  Service: Gastroenterology;;   BRONCHOSCOPY  10/2014   CARDIAC CATHETERIZATION     05/07/12   CARDIOVERSION     x2   COLONOSCOPY     several yrs   DG BIOPSY LUNG Left 10/2014   FNA - Adenocarcinoma    ESOPHAGOGASTRODUODENOSCOPY (EGD) WITH PROPOFOL N/A 12/21/2019   Procedure: ESOPHAGOGASTRODUODENOSCOPY (EGD) WITH PROPOFOL;  Surgeon: Lemar Lofty., MD;  Location: [facility] ENDOSCOPY;  Service: Gastroenterology;  Laterality: N/A;   ESOPHAGOGASTRODUODENOSCOPY (EGD) WITH PROPOFOL N/A 05/23/2020   Procedure: ESOPHAGOGASTRODUODENOSCOPY (EGD) WITH PROPOFOL;  Surgeon: Meridee Score [name], MD;  Location: [facility] ENDOSCOPY;  Service: Gastroenterology;  Laterality: N/A;   ESOPHAGOGASTRODUODENOSCOPY (EGD) WITH PROPOFOL N/A 06/30/2020   Procedure: ESOPHAGOGASTRODUODENOSCOPY (EGD) WITH PROPOFOL;  Surgeon: Meridee Score [name], MD;  Location: [facility] ENDOSCOPY;  Service: Gastroenterology;  Laterality: N/A;  ultra slim scope avail   EUS N/A 12/21/2019   Procedure: UPPER ENDOSCOPIC ULTRASOUND (EUS) RADIAL;  Surgeon: Lemar Lofty., MD;  Location: [facility] ENDOSCOPY;  Service: Gastroenterology;  Laterality: N/A;   FEMUR IM NAIL Left 02/19/2017   FEMUR IM NAIL Left 02/19/2017   Procedure: INTRAMEDULLARY (IM) NAIL FEMORAL;  Surgeon: Teryl Lucy, MD;  Location: [facility]  OR;  Service: Orthopedics;  Laterality: Left;   IR FLUORO GUIDE PORT INSERTION RIGHT  07/03/2017   IR US GUIDE VASC ACCESS RIGHT  07/03/2017   LUNG CANCER SURGERY Left 12/2014   Wedge Resection    MULTIPLE TOOTH EXTRACTIONS     SAVORY DILATION N/A 05/23/2020   Procedure: SAVORY DILATION;  Surgeon: Lemar Lofty., MD;  Location: [facility] ENDOSCOPY;  Service: Gastroenterology;  Laterality: N/A;   SAVORY DILATION N/A 06/30/2020   Procedure: SAVORY DILATION;  Surgeon: Meridee Score [name], MD;  Location: [facility] ENDOSCOPY;  Service: Gastroenterology;  Laterality: N/A;   Status post TURP     TONSILLECTOMY     UPPER GASTROINTESTINAL ENDOSCOPY      Family History  Problem Relation Age of Onset   Breast cancer Mother    Breast cancer Maternal Aunt    Breast cancer Maternal Aunt    Lung disease Neg Hx    Colon cancer Neg Hx    Stomach cancer Neg Hx    Pancreatic cancer Neg Hx    Rectal cancer Neg Hx    Colon polyps Neg Hx    Esophageal cancer Neg Hx     Medications- Reconciled discharge and current  medications in Epic.  Current Outpatient Medications  Medication Sig Dispense Refill   Accu-Chek Softclix Lancets lancets Use as instructed 100 each 12   acetaminophen (TYLENOL) 500 MG tablet Take 500 mg by mouth every 8 (eight) hours as needed for moderate pain.     albuterol (VENTOLIN HFA) 108 (90 Base) MCG/ACT inhaler Inhale 2 puffs into the lungs every 6 (six) hours as needed for wheezing or shortness of breath. 51 each 3   amoxicillin-clavulanate (AUGMENTIN) 875-125 MG tablet Take 1 tablet by mouth 2 (two) times daily. 18 tablet 0   atorvastatin (LIPITOR) 40 MG tablet TAKE 1 TABLET EVERY DAY 90 tablet 3   bisoprolol (ZEBETA) 10 MG tablet Take 1 tablet (10 mg total) by mouth daily. 30 tablet 0   buPROPion (WELLBUTRIN XL) 150 MG 24 hr tablet Take 1 tablet (150 mg total) by mouth daily. 90 tablet 3   diazepam (VALIUM) 5 MG tablet Take 1 tablet (5 mg total) by mouth every 12 (twelve) hours  as needed for anxiety. 30 tablet 1   digoxin (LANOXIN) 0.125 MG tablet Take 1 tablet (0.125 mg total) by mouth daily. 90 tablet 3   DULoxetine (CYMBALTA) 60 MG capsule TAKE 1 CAPSULE EVERY DAY 90 capsule 3   ELIQUIS 5 MG TABS tablet TAKE 1 TABLET TWICE DAILY 180 tablet 3   esomeprazole (NEXIUM) 40 MG capsule TAKE 1 CAPSULE (40 MG TOTAL) BY MOUTH 2 (TWO) TIMES DAILY BEFORE A MEAL. 180 capsule 10   gabapentin (NEURONTIN) 300 MG capsule TAKE 2 CAPSULES AT BEDTIME AS NEEDED 180 capsule 3   glucose blood test strip Use as instructed 100 each 12   levothyroxine (SYNTHROID) 175 MCG tablet TAKE 1 TABLET EVERY DAY 90 tablet 3   lipase/protease/amylase (CREON) 36000 UNITS CPEP capsule Take 4 capsules po during each meal. Take 1-2 capsules po during each snack.( up to 2 snacks daily). 400 capsule 3   polyethylene glycol (MIRALAX / GLYCOLAX) 17 g packet Take 17 g by mouth daily as needed. 14 each 0   sucralfate (CARAFATE) 1 g tablet TAKE 1 TABLET FOUR TIMES DAILY WITH MEALS AND AT BEDTIME 360 tablet 3   SYNJARDY 5-500 MG TABS TAKE 1 TABLET IN THE MORNING AND AT BEDTIME 180 tablet 3   tamsulosin (FLOMAX) 0.4 MG CAPS capsule TAKE 1 CAPSULE EVERY DAY 90 capsule 10   Tiotropium Bromide-Olodaterol (STIOLTO RESPIMAT) 2.5-2.5 MCG/ACT AERS Inhale 2 puffs into the lungs daily. 12 g 2   No current facility-administered medications for this visit.    Allergies-reviewed and updated Allergies  Allergen Reactions   Carboplatin Itching, Nausea And Vomiting and Other (See Comments)    Flushing   Flecainide Hypertension    CAUSED HEART ISSUES    Debrox [Carbamide Peroxide]     Swelling in ear canal.     Social History   Socioeconomic History   Marital status: Married    Spouse name: Not on file   Number of children: Not on file   Years of education: Not on file   Highest education level: Not on file  Occupational History    Comment: retired   Tobacco Use   Smoking status: Former    Current packs/day:  0.00    Average packs/day: 1 pack/day for 50.0 years (50.0 ttl pk-yrs)    Types: Cigarettes    Start date: 08/16/1962    Quit date: 08/15/2012    Years since quitting: 10.4   Smokeless tobacco: Never  Vaping Use   Vaping  status: Never Used  Substance and Sexual Activity   Alcohol use: No   Drug use: No   Sexual activity: Not on file  Other Topics Concern   Not on file  Social History Narrative   [facility] (09/26/16):   Originally from [place]. Moved to [facility] February 2018. Always lived in [place]. Moved to be closer to children & grandchildren. No international travel. 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 No mold exposure. No bird exposure or hot tub exposure. Enjoys reading. Previously enjoyed wood working with domestic woods.    Social Determinants of Health   Financial Resource Strain: Low Risk  (01/19/2022)   Overall Financial Resource Strain (CARDIA)    Difficulty of Paying Living Expenses: Not hard at all  Food Insecurity: No Food Insecurity (01/14/2023)   Hunger Vital Sign    Worried About Running Out of Food in the Last Year: Never true    Ran Out of Food in the Last Year: Never true  Transportation Needs: No Transportation Needs (01/14/2023)   PRAPARE - Administrator, Civil Service (Medical): No    Lack of Transportation (Non-Medical): No  Physical Activity: Inactive (01/19/2022)   Exercise Vital Sign    Days of Exercise per Week: 0 days    Minutes of Exercise per Session: 0 min  Stress: No Stress Concern Present (01/19/2022)   [employer] of Occupational Health - Occupational Stress Questionnaire    Feeling of Stress : Not at all  Social Connections: Socially Isolated (01/19/2022)   Social Connection and Isolation Panel [NHANES]    Frequency of Communication with Friends and Family: Once a week    Frequency of Social Gatherings with Friends and Family: Once a week     Attends Religious Services: Never    Database administrator or Organizations: No    Attends Engineer, structural: Never    Marital Status: Married        Objective:  Physical Exam: BP 132/60   Pulse 100   Temp 97.8 F (36.6 C) (Temporal)   Ht 6\' 1"  (1.854 m)   Wt 210 lb (95.3 kg)   SpO2 99%   BMI 27.71 kg/m   Gen: NAD, resting comfortably CV: RRR with no murmurs appreciated Pulm: NWOB, CTAB with no crackles, wheezes, or rhonchi GI: Normal bowel sounds present. Soft, Nontender, Nondistended. MSK: No edema, cyanosis, or clubbing noted Skin: Warm, dry Neuro: Grossly normal, moves all extremities Psych: Normal affect and thought content  Time Spent: 45 minutes of total time was spent on the date of the encounter performing the following actions: chart review prior to seeing the patient, obtaining history, performing a medically necessary exam, counseling on the treatment plan, placing orders, and documenting in our EHR.        Katina Degree. Jimmey Ralph, MD 01/21/2023 2:28 PM

## 2023-01-21 NOTE — Assessment & Plan Note (Signed)
Too early to recheck A1c today. He will continue synjardy 5-500 twice daily. Will recheck next office visit.

## 2023-01-21 NOTE — Patient Instructions (Signed)
It was very nice to see you today!  We will check blood work today.  Please finish your antibiotics.   Return in about 3 months (around 04/23/2023) for Follow Up.   Take care, Dr Jimmey Ralph  PLEASE NOTE:  If you had any lab tests, please let us know if you have not heard back within a few days. You may see your results on mychart before we have a chance to review them but we will give you a call once they are reviewed by [place].   If we ordered any referrals today, please let us know if you have not heard from their office within the next week.   If you had any urgent prescriptions sent in today, please check with the pharmacy within an hour of our visit to make sure the prescription was transmitted appropriately.   Please try these tips to maintain a healthy lifestyle:  Eat at least 3 REAL meals and 1-2 snacks per day.  Aim for no more than 5 hours between eating.  If you eat breakfast, please do so within one hour of getting up.   Each meal should contain half fruits/vegetables, one quarter protein, and one quarter carbs (no bigger than a computer mouse)  Cut down on sweet beverages. This includes juice, soda, and sweet tea.   Drink at least 1 glass of water with each meal and aim for at least 8 glasses per day  Exercise at least 150 minutes every week.

## 2023-01-22 ENCOUNTER — Telehealth (HOSPITAL_BASED_OUTPATIENT_CLINIC_OR_DEPARTMENT_OTHER): Payer: Self-pay | Admitting: Cardiovascular Disease

## 2023-01-22 LAB — DIGOXIN LEVEL: Digoxin, Serum: 0.4 ng/mL — ABNORMAL LOW (ref 0.5–0.9)

## 2023-01-22 NOTE — Telephone Encounter (Signed)
Pre-operative Risk Assessment    Patient Name: Victor Huber  DOB: 19-Dec-1950 MRN: [id number]     Request for Surgical Clearance    Procedure:  patient coming in for exam and cleaning  Date of Surgery:  Clearance 01/23/23                                 Surgeon:  Dr. Robbie Lis  Surgeon's Group or Practice Name:   [facility]  Phone number:  [phone] Fax number:  [phone]   Type of Clearance Requested:   - Medical  - Pharmacy:  Hold Apixaban (Eliquis) Does he need to be off of his blood thinner for the cleaning.    Type of Anesthesia:  None    Additional requests/questions:   They would like to know for the future what type of anesthesia if required they could use, and if he would have to be off of his blood thinner if he would require extensive cleaning.     Emmaline Kluver   01/22/2023, 10:28 AM

## 2023-01-22 NOTE — Telephone Encounter (Signed)
    Primary Cardiologist: Chilton Si, MD  Chart reviewed as part of pre-operative protocol coverage. Simple dental extractions are considered low risk procedures per guidelines and generally do not require any specific cardiac clearance. It is also generally accepted that for simple extractions and dental cleanings, there is no need to interrupt blood thinner therapy.   SBE prophylaxis is not required for the patient.  I will route this recommendation to the requesting party via Epic fax function and remove from pre-op pool.  Please call with questions.  Ronney Asters, NP 01/22/2023, 10:55 AM

## 2023-01-23 NOTE — Progress Notes (Signed)
Good news! Labs are back to normal. We can recheck next office visit.

## 2023-01-24 NOTE — Plan of Care (Signed)
[facility] Tonsillectomy/Adenoidectomy, Postoperative PEDS care plan entered in error.

## 2023-02-05 DIAGNOSIS — R338 Other retention of urine: Secondary | ICD-10-CM | POA: Diagnosis not present

## 2023-02-06 DIAGNOSIS — F411 Generalized anxiety disorder: Secondary | ICD-10-CM | POA: Diagnosis not present

## 2023-02-06 DIAGNOSIS — F0631 Mood disorder due to known physiological condition with depressive features: Secondary | ICD-10-CM | POA: Diagnosis not present

## 2023-02-12 ENCOUNTER — Telehealth: Payer: Self-pay | Admitting: Gastroenterology

## 2023-02-12 NOTE — Telephone Encounter (Signed)
Returned call to patient's wife. Informed her that application was completed and faxed back to my [employer]  01/21/23. Left detailed message.

## 2023-02-12 NOTE — Telephone Encounter (Signed)
Inbound call from patient's wife, states she spoke to [facility] and they needed page 4. She states she would also like to pick up a copy of the application for her records

## 2023-02-12 NOTE — Telephone Encounter (Signed)
Inbound call from patients wife stating about a month ago she came by and dropped off some paperwork for patient assistance to [employer]. She stated that she has not heard anything more about it, and is requesting a call to follow up. Please advise.

## 2023-02-14 ENCOUNTER — Encounter: Payer: Self-pay | Admitting: Family Medicine

## 2023-02-14 ENCOUNTER — Ambulatory Visit: Payer: Medicare HMO | Admitting: Family Medicine

## 2023-02-14 VITALS — BP 111/69 | HR 72 | Temp 98.0°F | Ht 73.0 in | Wt 212.8 lb

## 2023-02-14 DIAGNOSIS — Z7984 Long term (current) use of oral hypoglycemic drugs: Secondary | ICD-10-CM | POA: Diagnosis not present

## 2023-02-14 DIAGNOSIS — E1169 Type 2 diabetes mellitus with other specified complication: Secondary | ICD-10-CM | POA: Diagnosis not present

## 2023-02-14 DIAGNOSIS — F321 Major depressive disorder, single episode, moderate: Secondary | ICD-10-CM

## 2023-02-14 LAB — POCT GLYCOSYLATED HEMOGLOBIN (HGB A1C): Hemoglobin A1C: 8 % — AB (ref 4.0–5.6)

## 2023-02-14 NOTE — Assessment & Plan Note (Signed)
Overall depressive symptoms are well-controlled.  Following with psychiatry now who switched to Cymbalta to Zoloft 25 mg daily.  He is also on Wellbutrin 150 mg daily.  He is having a few mild withdrawal side effects from Cymbalta including headache, upset stomach, and dry mouth.  These are currently manageable.  He will be establishing with a therapist soon as well.  He will let us know if we can be of any further assistance.

## 2023-02-14 NOTE — Patient Instructions (Signed)
It was very nice to see you today!  Your A1c is improving.   Will see back in 3 months.  Come back to see [place] sooner if needed.  Return in about 3 months (around 05/17/2023).   Take care, Dr Jimmey Ralph  PLEASE NOTE:  If you had any lab tests, please let us know if you have not heard back within a few days. You may see your results on mychart before we have a chance to review them but we will give you a call once they are reviewed by [place].   If we ordered any referrals today, please let us know if you have not heard from their office within the next week.   If you had any urgent prescriptions sent in today, please check with the pharmacy within an hour of our visit to make sure the prescription was transmitted appropriately.   Please try these tips to maintain a healthy lifestyle:  Eat at least 3 REAL meals and 1-2 snacks per day.  Aim for no more than 5 hours between eating.  If you eat breakfast, please do so within one hour of getting up.   Each meal should contain half fruits/vegetables, one quarter protein, and one quarter carbs (no bigger than a computer mouse)  Cut down on sweet beverages. This includes juice, soda, and sweet tea.   Drink at least 1 glass of water with each meal and aim for at least 8 glasses per day  Exercise at least 150 minutes every week.

## 2023-02-14 NOTE — Progress Notes (Signed)
   Victor Huber is a 72 y.o. male who presents today for an office visit.  Assessment/Plan:  Chronic Problems Addressed Today: T2DM (type 2 diabetes mellitus) (HCC) A1c improved 8.0.  Will continue Synjardy 5-500 twice daily.  He will continue working on lifestyle interventions.  Recheck in 3 months.  Depression, major, single episode, moderate (HCC) Overall depressive symptoms are well-controlled.  Following with psychiatry now who switched to Cymbalta to Zoloft 25 mg daily.  He is also on Wellbutrin 150 mg daily.  He is having a few mild withdrawal side effects from Cymbalta including headache, upset stomach, and dry mouth.  These are currently manageable.  He will be establishing with a therapist soon as well.  He will let us know if we can be of any further assistance.     Subjective:  HPI:  See Assessment / plan for status of chronic conditions.  Patient is here today for diabetes follow-up.  Saw him 3 months ago for this.  A1c was 8.7.  Continue Synjardy 5-500 twice daily.  He has done well with this.  Since her last visit he has been following with psychiatry.  They switched him from Cymbalta to Zoloft.  Overall mood has been stable though has had little bit more issues with headache, upset stomach, and dry mouth.       Objective:  Physical Exam: BP 111/69   Pulse 72   Temp 98 F (36.7 C) (Temporal)   Ht 6\' 1"  (1.854 m)   Wt 212 lb 12.8 oz (96.5 kg)   SpO2 95%   BMI 28.08 kg/m   Wt Readings from Last 3 Encounters:  02/14/23 212 lb 12.8 oz (96.5 kg)  01/21/23 210 lb (95.3 kg)  01/13/23 223 lb 15.8 oz (101.6 kg)  Gen: No acute distress, resting comfortably CV: Irregular rate and rhythm with no murmurs appreciated Pulm: Normal work of breathing, clear to auscultation bilaterally with no crackles, wheezes, or rhonchi Neuro: Grossly normal, moves all extremities Psych: Normal affect and thought content      Iasia Forcier M. Jimmey Ralph, MD 02/14/2023 2:41 PM

## 2023-02-14 NOTE — Assessment & Plan Note (Signed)
A1c improved 8.0.  Will continue Synjardy 5-500 twice daily.  He will continue working on lifestyle interventions.  Recheck in 3 months.

## 2023-02-15 ENCOUNTER — Inpatient Hospital Stay: Payer: Medicare HMO | Attending: Internal Medicine

## 2023-02-15 ENCOUNTER — Other Ambulatory Visit: Payer: Self-pay | Admitting: Internal Medicine

## 2023-02-15 ENCOUNTER — Other Ambulatory Visit: Payer: Medicare HMO

## 2023-02-15 ENCOUNTER — Ambulatory Visit (HOSPITAL_COMMUNITY)
Admission: RE | Admit: 2023-02-15 | Discharge: 2023-02-15 | Disposition: A | Discharge status: Home / Self Care | Payer: Medicare HMO | Source: Ambulatory Visit | Attending: Internal Medicine | Admitting: Internal Medicine

## 2023-02-15 DIAGNOSIS — C3490 Malignant neoplasm of unspecified part of unspecified bronchus or lung: Secondary | ICD-10-CM | POA: Insufficient documentation

## 2023-02-15 DIAGNOSIS — J432 Centrilobular emphysema: Secondary | ICD-10-CM | POA: Diagnosis not present

## 2023-02-15 DIAGNOSIS — I700 Atherosclerosis of aorta: Secondary | ICD-10-CM | POA: Diagnosis not present

## 2023-02-15 DIAGNOSIS — J479 Bronchiectasis, uncomplicated: Secondary | ICD-10-CM | POA: Diagnosis not present

## 2023-02-15 DIAGNOSIS — E875 Hyperkalemia: Secondary | ICD-10-CM | POA: Insufficient documentation

## 2023-02-15 DIAGNOSIS — C3432 Malignant neoplasm of lower lobe, left bronchus or lung: Secondary | ICD-10-CM | POA: Insufficient documentation

## 2023-02-15 DIAGNOSIS — C7951 Secondary malignant neoplasm of bone: Secondary | ICD-10-CM | POA: Insufficient documentation

## 2023-02-15 DIAGNOSIS — C770 Secondary and unspecified malignant neoplasm of lymph nodes of head, face and neck: Secondary | ICD-10-CM | POA: Insufficient documentation

## 2023-02-15 LAB — CBC WITH DIFFERENTIAL (CANCER CENTER ONLY)
Abs Immature Granulocytes: 0.02 10*3/uL (ref 0.00–0.07)
Basophils Absolute: 0 10*3/uL (ref 0.0–0.1)
Basophils Relative: 0 %
Eosinophils Absolute: 0.1 10*3/uL (ref 0.0–0.5)
Eosinophils Relative: 1 %
HCT: 46.3 % (ref 39.0–52.0)
Hemoglobin: 15.8 g/dL (ref 13.0–17.0)
Immature Granulocytes: 0 %
Lymphocytes Relative: 24 %
Lymphs Abs: 1.9 10*3/uL (ref 0.7–4.0)
MCH: 32.2 pg (ref 26.0–34.0)
MCHC: 34.1 g/dL (ref 30.0–36.0)
MCV: 94.5 fL (ref 80.0–100.0)
Monocytes Absolute: 0.5 10*3/uL (ref 0.1–1.0)
Monocytes Relative: 7 %
Neutro Abs: 5.3 10*3/uL (ref 1.7–7.7)
Neutrophils Relative %: 68 %
Platelet Count: 155 10*3/uL (ref 150–400)
RBC: 4.9 MIL/uL (ref 4.22–5.81)
RDW: 14 % (ref 11.5–15.5)
WBC Count: 7.8 10*3/uL (ref 4.0–10.5)
nRBC: 0 % (ref 0.0–0.2)

## 2023-02-15 LAB — CMP (CANCER CENTER ONLY)
ALT: 15 U/L (ref 0–44)
AST: 15 U/L (ref 15–41)
Albumin: 4.2 g/dL (ref 3.5–5.0)
Alkaline Phosphatase: 76 U/L (ref 38–126)
Anion gap: 4 — ABNORMAL LOW (ref 5–15)
BUN: 24 mg/dL — ABNORMAL HIGH (ref 8–23)
CO2: 30 mmol/L (ref 22–32)
Calcium: 9.7 mg/dL (ref 8.9–10.3)
Chloride: 105 mmol/L (ref 98–111)
Creatinine: 1.05 mg/dL (ref 0.61–1.24)
GFR, Estimated: >60 mL/min (ref >60)
Glucose, Bld: 233 mg/dL — ABNORMAL HIGH (ref 70–99)
Potassium: 5.5 mmol/L — ABNORMAL HIGH (ref 3.5–5.1)
Sodium: 139 mmol/L (ref 135–145)
Total Bilirubin: 0.6 mg/dL (ref <1.2)
Total Protein: 6.7 g/dL (ref 6.5–8.1)

## 2023-02-15 MED ORDER — IOHEXOL 300 MG/ML  SOLN
100.0000 mL | Freq: Once | INTRAMUSCULAR | Status: AC | PRN
  Administered 2023-02-15: 100 mL via INTRAVENOUS

## 2023-02-18 ENCOUNTER — Telehealth: Payer: Self-pay | Admitting: Family Medicine

## 2023-02-18 NOTE — Telephone Encounter (Signed)
Shermin, from [facility] pharmacy called for clarification on a medication for pt. States can be reached back @ [phone].

## 2023-02-19 ENCOUNTER — Inpatient Hospital Stay (HOSPITAL_BASED_OUTPATIENT_CLINIC_OR_DEPARTMENT_OTHER): Payer: Medicare HMO | Admitting: Internal Medicine

## 2023-02-19 VITALS — BP 109/84 | HR 84 | Temp 98.0°F | Resp 17 | Ht 73.0 in | Wt 213.2 lb

## 2023-02-19 DIAGNOSIS — C3490 Malignant neoplasm of unspecified part of unspecified bronchus or lung: Secondary | ICD-10-CM | POA: Diagnosis not present

## 2023-02-19 DIAGNOSIS — C7951 Secondary malignant neoplasm of bone: Secondary | ICD-10-CM | POA: Diagnosis not present

## 2023-02-19 DIAGNOSIS — C770 Secondary and unspecified malignant neoplasm of lymph nodes of head, face and neck: Secondary | ICD-10-CM | POA: Diagnosis not present

## 2023-02-19 DIAGNOSIS — E875 Hyperkalemia: Secondary | ICD-10-CM | POA: Diagnosis not present

## 2023-02-19 DIAGNOSIS — C3432 Malignant neoplasm of lower lobe, left bronchus or lung: Secondary | ICD-10-CM | POA: Diagnosis not present

## 2023-02-19 NOTE — Progress Notes (Signed)
[facility] [phone]   [phone]  OFFICE PROGRESS NOTE  Victor Dark, MD [address] [place] [address]  DIAGNOSIS: Metastatic non-small cell lung cancer initially diagnosed as stage IIIA (T2a, N2, M0) non-small cell lung cancer, poorly differentiated adenocarcinoma presented with left lower lobe lung mass in addition to mediastinal lymphadenopathy.  The patient was diagnosed with metastatic disease involving the left femur as well as left supraclavicular nodal metastases and right paratracheal lymphadenopathy in October 2018.  Biomarker Findings Microsatellite Status - MS-Stable Tumor Mutational Burden - TMB-Low (3 Muts/Mb) Genomic Findings For a complete list of the genes assayed, please refer to the Appendix. STK11 P279fs*6 CDKN2A p14ARF splice site 193+1G>T DAXX E374* MLL2 V4520fs*40 NBN K211fs*5 NOTCH2 R1410H PMS2 splice site 988+1G>A 8 Disease relevant genes with no reportable alterations: EGFR, KRAS, ALK, BRAF, MET, RET, ERBB2, ROS1   PDL1 expression 5%  PRIOR THERAPY: 1) status post wedge resection of the left lower lobe lung mass as well as AP window lymph node dissection but there was residual metastatic mediastinal lymphadenopathy that could not be resected. 2) a course of concurrent chemoradiation with weekly carboplatin and paclitaxel in [place] completed 03/01/2015.  3) status post palliative radiotherapy to the left femur metastatic bone disease. 4)  Systemic chemotherapy with carboplatin for AUC of 5, Alimta 500 mg/M2 and Keytruda 200 mg IV every 3 weeks.  First dose March 07, 2017.  Carboplatin was discontinued during cycle #2 secondary to hypersensitivity reaction. 5) status post 2 cycles of maintenance treatment with Alimta and Ketruda (pembrolizumab).  Alimta was discontinued secondary to intolerance. 6) Maintenance treatment with single agent Ketruda (pembrolizumab) status post 49  cycles.  His treatment has  been on hold since January 2022 secondary to intolerance.  CURRENT THERAPY: Observation.  INTERVAL HISTORY: Victor Huber 72 y.o. male returns to the clinic today for 69-month follow-up visit.Discussed the use of AI scribe software for clinical note transcription with the patient, who gave verbal consent to proceed.  History of Present Illness   Victor Huber, a 72 year old with a history of stage 3 non-small cell lung cancer, presents for a routine follow-up. The initial diagnosis was made in October 2018, and they underwent chemotherapy and radiation therapy. When the cancer began to spread, they were started on a regimen of carboplatin, Alimta, and Keytruda. They remained on Keytruda for 49 cycles and have been off treatment for the past two years.  In terms of their cancer, they report no new developments. However, they were recently hospitalized for four days due to sepsis secondary to a urinary tract infection (UTI). They received a course of intravenous antibiotics in the hospital and continued with oral antibiotics at home. They are gradually feeling better since this episode.  Their breathing has been relatively stable, with some variability from day to day. They deny any recent episodes of nausea, vomiting, or diarrhea. They have not lost any weight and, in fact, have gained a few pounds since their last visit in October.  They have two sons, one of whom lives locally and often accompanies them to appointments. They also have two grandchildren, one in [place] and one in [place].       MEDICAL HISTORY: Past Medical History:  Diagnosis Date   Adenocarcinoma of left lung, stage 3 (HCC) 08/15/2016   Allergy    Anxiety    Arthritis    Atrial fibrillation (HCC) 08/15/2016   Atrial fibrillation (HCC)    Cancer, metastatic  to bone [facility])    lung   Cataract    Waiting to schedule bilateral cataract surgery   CHF (congestive heart failure) (HCC) 01/04/2022   COPD (chronic  obstructive pulmonary disease) (HCC) 08/15/2016   Depression    Diabetes mellitus, type 2 (HCC)    Dysphagia    Dysrhythmia    a-fib   GERD (gastroesophageal reflux disease)    Heart murmur    atenlol for A Fib/Eliquis   History of chemotherapy    History of radiation therapy    Hyperlipidemia    Hypertension    Hypothyroid 08/15/2016   Longstanding persistent atrial fibrillation (HCC) 08/29/2016   Pathologic fracture    left femur   Pneumonitis    S/P TURP 08/15/2016   T2DM (type 2 diabetes mellitus) (HCC) 05/03/2022   Wears glasses    Wears hearing aid in both ears     ALLERGIES:  is allergic to carboplatin, flecainide, and debrox [carbamide peroxide].  MEDICATIONS:  Current Outpatient Medications  Medication Sig Dispense Refill   Accu-Chek Softclix Lancets lancets Use as instructed 100 each 12   acetaminophen (TYLENOL) 500 MG tablet Take 500 mg by mouth every 8 (eight) hours as needed for moderate pain.     albuterol (VENTOLIN HFA) 108 (90 Base) MCG/ACT inhaler Inhale 2 puffs into the lungs every 6 (six) hours as needed for wheezing or shortness of breath. 51 each 3   amoxicillin-clavulanate (AUGMENTIN) 875-125 MG tablet Take 1 tablet by mouth 2 (two) times daily. 18 tablet 0   atorvastatin (LIPITOR) 40 MG tablet TAKE 1 TABLET EVERY DAY 90 tablet 3   bisoprolol (ZEBETA) 10 MG tablet Take 1 tablet (10 mg total) by mouth daily. 30 tablet 0   buPROPion (WELLBUTRIN XL) 150 MG 24 hr tablet Take 1 tablet (150 mg total) by mouth daily. 90 tablet 3   DENTA 5000 PLUS 1.1 % CREA dental cream USE THREE TIMES PER DAY AS DIRECTED     diazepam (VALIUM) 5 MG tablet Take 1 tablet (5 mg total) by mouth every 12 (twelve) hours as needed for anxiety. 30 tablet 1   digoxin (LANOXIN) 0.125 MG tablet Take 1 tablet (0.125 mg total) by mouth daily. 90 tablet 3   ELIQUIS 5 MG TABS tablet TAKE 1 TABLET TWICE DAILY 180 tablet 3   esomeprazole (NEXIUM) 40 MG capsule TAKE 1 CAPSULE (40 MG TOTAL) BY  MOUTH 2 (TWO) TIMES DAILY BEFORE A MEAL. 180 capsule 10   gabapentin (NEURONTIN) 300 MG capsule TAKE 2 CAPSULES AT BEDTIME AS NEEDED 180 capsule 3   glucose blood test strip Use as instructed 100 each 12   levothyroxine (SYNTHROID) 175 MCG tablet TAKE 1 TABLET EVERY DAY 90 tablet 3   lipase/protease/amylase (CREON) 36000 UNITS CPEP capsule Take 4 capsules po during each meal. Take 1-2 capsules po during each snack.( up to 2 snacks daily). 400 capsule 3   polyethylene glycol (MIRALAX / GLYCOLAX) 17 g packet Take 17 g by mouth daily as needed. 14 each 0   sertraline (ZOLOFT) 25 MG tablet Take 25 mg by mouth daily.     sucralfate (CARAFATE) 1 g tablet TAKE 1 TABLET FOUR TIMES DAILY WITH MEALS AND AT BEDTIME 360 tablet 3   SYNJARDY 5-500 MG TABS TAKE 1 TABLET IN THE MORNING AND AT BEDTIME 180 tablet 3   tamsulosin (FLOMAX) 0.4 MG CAPS capsule TAKE 1 CAPSULE EVERY DAY 90 capsule 10   Tiotropium Bromide-Olodaterol (STIOLTO RESPIMAT) 2.5-2.5 MCG/ACT AERS Inhale 2 puffs  into the lungs daily. 12 g 2   No current facility-administered medications for this visit.    SURGICAL HISTORY:  Past Surgical History:  Procedure Laterality Date   BIOPSY  12/21/2019   Procedure: BIOPSY;  Surgeon: [name], [name], MD;  Location: [facility] ENDOSCOPY;  Service: Gastroenterology;;   BIOPSY  05/23/2020   Procedure: BIOPSY;  Surgeon: Lemar Lofty., MD;  Location: [facility] ENDOSCOPY;  Service: Gastroenterology;;   BRONCHOSCOPY  10/2014   CARDIAC CATHETERIZATION     05/07/12   CARDIOVERSION     x2   COLONOSCOPY     several yrs   DG BIOPSY LUNG Left 10/2014   FNA - Adenocarcinoma    ESOPHAGOGASTRODUODENOSCOPY (EGD) WITH PROPOFOL N/A 12/21/2019   Procedure: ESOPHAGOGASTRODUODENOSCOPY (EGD) WITH PROPOFOL;  Surgeon: Lemar Lofty., MD;  Location: [facility] ENDOSCOPY;  Service: Gastroenterology;  Laterality: N/A;   ESOPHAGOGASTRODUODENOSCOPY (EGD) WITH PROPOFOL N/A 05/23/2020   Procedure:  ESOPHAGOGASTRODUODENOSCOPY (EGD) WITH PROPOFOL;  Surgeon: Meridee Score [name], MD;  Location: [facility] ENDOSCOPY;  Service: Gastroenterology;  Laterality: N/A;   ESOPHAGOGASTRODUODENOSCOPY (EGD) WITH PROPOFOL N/A 06/30/2020   Procedure: ESOPHAGOGASTRODUODENOSCOPY (EGD) WITH PROPOFOL;  Surgeon: Meridee Score [name], MD;  Location: [facility] ENDOSCOPY;  Service: Gastroenterology;  Laterality: N/A;  ultra slim scope avail   EUS N/A 12/21/2019   Procedure: UPPER ENDOSCOPIC ULTRASOUND (EUS) RADIAL;  Surgeon: Lemar Lofty., MD;  Location: [facility] ENDOSCOPY;  Service: Gastroenterology;  Laterality: N/A;   FEMUR IM NAIL Left 02/19/2017   FEMUR IM NAIL Left 02/19/2017   Procedure: INTRAMEDULLARY (IM) NAIL FEMORAL;  Surgeon: Teryl Lucy, MD;  Location: [facility] OR;  Service: Orthopedics;  Laterality: Left;   IR FLUORO GUIDE PORT INSERTION RIGHT  07/03/2017   IR US GUIDE VASC ACCESS RIGHT  07/03/2017   LUNG CANCER SURGERY Left 12/2014   Wedge Resection    MULTIPLE TOOTH EXTRACTIONS     SAVORY DILATION N/A 05/23/2020   Procedure: SAVORY DILATION;  Surgeon: Lemar Lofty., MD;  Location: [facility] ENDOSCOPY;  Service: Gastroenterology;  Laterality: N/A;   SAVORY DILATION N/A 06/30/2020   Procedure: SAVORY DILATION;  Surgeon: Meridee Score [name], MD;  Location: [facility] ENDOSCOPY;  Service: Gastroenterology;  Laterality: N/A;   Status post TURP     TONSILLECTOMY     UPPER GASTROINTESTINAL ENDOSCOPY      REVIEW OF SYSTEMS:  Constitutional: negative Eyes: negative Ears, nose, mouth, throat, and face: negative Respiratory: positive for dyspnea on exertion Cardiovascular: negative Gastrointestinal: negative Genitourinary:negative Integument/breast: negative Hematologic/lymphatic: negative Musculoskeletal:negative Neurological: negative Behavioral/Psych: negative Endocrine: negative Allergic/Immunologic: negative   PHYSICAL EXAMINATION: General appearance: alert, cooperative, fatigued, and no  distress Head: Normocephalic, without obvious abnormality, atraumatic Neck: no adenopathy, no JVD, supple, symmetrical, trachea midline, and thyroid not enlarged, symmetric, no tenderness/mass/nodules Lymph nodes: Cervical, supraclavicular, and axillary nodes normal. Resp: clear to auscultation bilaterally Back: symmetric, no curvature. ROM normal. No CVA tenderness. Cardio: regular rate and rhythm, S1, S2 normal, no murmur, click, rub or gallop GI: soft, non-tender; bowel sounds normal; no masses,  no organomegaly Extremities: extremities normal, atraumatic, no cyanosis or edema Neurologic: Alert and oriented X 3, normal strength and tone. Normal symmetric reflexes. Normal coordination and gait  ECOG PERFORMANCE STATUS: 1 - Symptomatic but completely ambulatory  Blood pressure 109/84, pulse 84, temperature 98 F (36.7 C), temperature source Temporal, resp. rate 17, height 6\' 1"  (1.854 m), weight 213 lb 3.2 oz (96.7 kg), SpO2 98%.  LABORATORY DATA: Lab Results  Component Value Date   WBC 7.8 02/15/2023   HGB  15.8 02/15/2023   HCT 46.3 02/15/2023   MCV 94.5 02/15/2023   PLT 155 02/15/2023      Chemistry      Component Value Date/Time   NA 139 02/15/2023 1424   NA 140 05/31/2022 1022   NA 136 04/04/2017 1141   K 5.5 (H) 02/15/2023 1424   K 4.4 04/04/2017 1141   CL 105 02/15/2023 1424   CO2 30 02/15/2023 1424   CO2 24 04/04/2017 1141   BUN 24 (H) 02/15/2023 1424   BUN 12 05/31/2022 1022   BUN 21.1 04/04/2017 1141   CREATININE 1.05 02/15/2023 1424   CREATININE 1.1 04/04/2017 1141      Component Value Date/Time   CALCIUM 9.7 02/15/2023 1424   CALCIUM 9.1 04/04/2017 1141   ALKPHOS 76 02/15/2023 1424   ALKPHOS 89 04/04/2017 1141   AST 15 02/15/2023 1424   AST 17 04/04/2017 1141   ALT 15 02/15/2023 1424   ALT 18 04/04/2017 1141   BILITOT 0.6 02/15/2023 1424   BILITOT 0.67 04/04/2017 1141       RADIOGRAPHIC STUDIES: CT CHEST ABDOMEN PELVIS W CONTRAST  Result Date:  02/19/2023 CLINICAL DATA:  Non-small cell lung cancer. Restaging exam. * Tracking Code: BO * EXAM: CT CHEST, ABDOMEN, AND PELVIS WITH CONTRAST TECHNIQUE: Multidetector CT imaging of the chest, abdomen and pelvis was performed following the standard protocol during bolus administration of intravenous contrast. RADIATION DOSE REDUCTION: This exam was performed according to the departmental dose-optimization program which includes automated exposure control, adjustment of the mA and/or kV according to patient size and/or use of iterative reconstruction technique. CONTRAST:  OMNIPAQUE IOHEXOL 300 MG/ML  SOLN COMPARISON:  CT 01/09/2023 FINDINGS: CT CHEST FINDINGS Cardiovascular: No significant vascular findings. Normal heart size. No pericardial effusion. Mediastinum/Nodes: Mediastinal lymph nodes are not pathologically enlarged and not changed from comparison exam. No new adenopathy. No supraclavicular adenopathy. Port in the anterior chest wall with tip in distal SVC. Lungs/Pleura: Centrilobular emphysema the upper lobes. Postsurgical change in the LEFT lung. Perihilar consolidation with bronchiectasis at the LEFT hilum without change in pattern from comparison exam. No new nodularity. RIGHT lung clear. Musculoskeletal: Mixed lytic and sclerotic lesion in the T4 vertebral body (image 117/sagittal/series 6) is not changed from comparison exam. Favor benign hemangioma CT ABDOMEN AND PELVIS FINDINGS Hepatobiliary: No focal hepatic lesion. No biliary ductal dilatation. Multiple gallstones. Common bile duct is normal. Pancreas: Pancreas is normal. No ductal dilatation. No pancreatic inflammation. Spleen: Normal spleen Adrenals/urinary tract: Small nodule of the RIGHT adrenal gland measures 10 mm not changed from multiple CT comparison exams. No specific follow-up recommended. Bilateral simple fluid attenuation cysts of the kidneys on post-contrast exam. Ureters and bladder normal. Stomach/Bowel: The stomach,  duodenum, and small bowel normal. Normal appendix. The colon and rectosigmoid colon are normal. Vascular/Lymphatic: Abdominal aorta is normal caliber with atherosclerotic calcification. There is no retroperitoneal or periportal lymphadenopathy. No pelvic lymphadenopathy. Reproductive: Prostate unremarkable Other: No intraperitoneal free fluid. No peritoneal nodularity. Musculoskeletal: No aggressive osseous lesion. IMPRESSION: CHEST: 1. Stable postsurgical change in the LEFT lung. 2. No evidence of recurrent lung cancer. 3. No evidence of metastatic adenopathy. 4. Stable mixed lytic and sclerotic lesion at T4 is favored benign hemangioma. No evidence skeletal metastasis. PELVIS: 1. No evidence of metastatic disease in the abdomen pelvis. 2. Cholelithiasis. 3. Aortic Atherosclerosis (ICD10-I70.0) and Emphysema (ICD10-J43.9). Electronically Signed   By: Genevive Bi M.D.   On: 02/19/2023 12:29     ASSESSMENT AND PLAN: This is a  72 years old white male with metastatic non-small cell lung cancer, adenocarcinoma with no actionable mutations and PDL 1 expression of 5% that was initially diagnosed as stage IIIa non-small cell lung cancer, adenocarcinoma status post left lower lobectomy with lymph node dissection followed by a course of concurrent chemoradiation completed in January 2016.  The patient had evidence for disease metastasis in October 2018 with metastatic disease to the left femur as well as left supraclavicular and right paratracheal lymph nodes. The patient is currently on systemic chemotherapy initially was with carboplatin, Alimta and Keytruda.  Carboplatin was discontinued secondary to hypersensitivity reaction starting from cycle #2.   He was also treated with 3 cycles of maintenance Alimta and Ketruda (pembrolizumab) but Alimta was discontinued secondary to intolerance. He underwent treatment with maintenance Keytruda as a single agent status post 49 cycles.  The patient has been on  observation since January 2022.  He is doing fine with no concerning complaints. He had repeat CT scan of the chest, abdomen and pelvis performed recently.  I personally and independently reviewed the scan and discussed the result with the patient today. His scan showed no concerning findings for disease recurrence or metastasis in the chest, abdomen or pelvis.    Metastatic NSCLC initially diagnosed as Stage III Non-Small Cell Lung Cancer Diagnosed in October 2018. Treated with initial chemotherapy and radiation, followed by carboplatin, Alimta, and Keytruda. Completed 49 cycles of Keytruda, with treatment on hold for the past two years. Recent scans show no evidence of recurrent lung cancer or metastatic disease. Breathing remains stable with slight weight gain. Patient prefers to continue current monitoring approach. - Continue monitoring with regular follow-up visits - Schedule next follow-up in six months  Sepsis secondary to Urinary Tract Infection (UTI) Recent four-day hospitalization for sepsis from a UTI. Treated with intravenous and oral antibiotics. Currently recovering and feeling gradually better. - Monitor for signs of recurrent infection - Encourage hydration and follow-up if symptoms recur  Hyperkalemia Potassium level elevated at 5.5. No potassium supplementation. Regular consumption of bananas noted. - Advise reducing intake of potassium-rich foods such as bananas - Recheck potassium levels at next visit  General Health Maintenance Overall health well-managed. Blood tests show normal CBC with no anemia and normal white blood count. - Continue routine health maintenance and screenings - Encourage healthy lifestyle choices  Follow-up - Schedule follow-up appointment in six months.   For the COPD and worsening shortness of breath he was advised to arrange another appointment with Dr. Delton Coombes for evaluation of his condition. For the depression, he is currently on Effexor ER by  his primary care physician and I recommended for him to discuss with Dr. Jimmey Ralph adjustment of his medication. For the hypothyroidism and diabetes mellitus he is currently followed by Dr. Jimmey Ralph. He was advised to call immediately if he has any concerning symptoms in the interval. The patient voices understanding of current disease status and treatment options and is in agreement with the current care plan. All questions were answered. The patient knows to call the clinic with any problems, questions or concerns. We can certainly see the patient much sooner if necessary.  Disclaimer: This note was dictated with voice recognition software. Similar sounding words can inadvertently be transcribed and may not be corrected upon review.

## 2023-02-20 ENCOUNTER — Other Ambulatory Visit: Payer: Self-pay | Admitting: *Deleted

## 2023-02-20 NOTE — Telephone Encounter (Signed)
Spoke with [facility] requesting clarification on  Rx Duloxetine(Cymbalta) 60mg   prescribed on 01/16/2023 No medication in current medication list  Please advise

## 2023-02-21 NOTE — Telephone Encounter (Signed)
His psychiatrist discontinued this a few weeks ago - they should reach out to them with questions.  Victor Huber. Jimmey Ralph, MD 02/21/2023 9:39 AM

## 2023-02-21 NOTE — Telephone Encounter (Signed)
Pharmacy notified, Rx was discontinue in their system

## 2023-03-04 DIAGNOSIS — F0631 Mood disorder due to known physiological condition with depressive features: Secondary | ICD-10-CM | POA: Diagnosis not present

## 2023-03-04 DIAGNOSIS — F411 Generalized anxiety disorder: Secondary | ICD-10-CM | POA: Diagnosis not present

## 2023-03-06 ENCOUNTER — Other Ambulatory Visit: Payer: Self-pay | Admitting: Gastroenterology

## 2023-03-08 DIAGNOSIS — F411 Generalized anxiety disorder: Secondary | ICD-10-CM | POA: Diagnosis not present

## 2023-03-08 DIAGNOSIS — F0631 Mood disorder due to known physiological condition with depressive features: Secondary | ICD-10-CM | POA: Diagnosis not present

## 2023-03-11 ENCOUNTER — Encounter: Payer: Self-pay | Admitting: Family Medicine

## 2023-03-11 NOTE — Telephone Encounter (Signed)
 Care team updated and letter sent for eye exam notes.

## 2023-03-12 ENCOUNTER — Encounter: Payer: Self-pay | Admitting: Family Medicine

## 2023-03-12 DIAGNOSIS — R339 Retention of urine, unspecified: Secondary | ICD-10-CM

## 2023-03-13 NOTE — Telephone Encounter (Signed)
See note

## 2023-03-14 NOTE — Telephone Encounter (Signed)
I would like to get a urine specifimen for a culture if at all possible.  He can have someone come here and pick up a specimen cup if he is able to.  If unable to do this we can go ahead and send in Keflex 500 mg twice daily to treat any potential infection however it is ideal for [place] to get a urine culture before we do this.  Katina Degree. Jimmey Ralph, MD 03/14/2023 3:30 PM

## 2023-03-15 ENCOUNTER — Other Ambulatory Visit: Payer: Medicare HMO

## 2023-03-15 DIAGNOSIS — R339 Retention of urine, unspecified: Secondary | ICD-10-CM

## 2023-03-15 MED ORDER — CEPHALEXIN 500 MG PO CAPS: 500.0000 mg | ORAL_CAPSULE | Freq: Two times a day (BID) | ORAL | 0 refills | Status: DC

## 2023-03-18 DIAGNOSIS — F411 Generalized anxiety disorder: Secondary | ICD-10-CM | POA: Diagnosis not present

## 2023-03-18 DIAGNOSIS — R339 Retention of urine, unspecified: Secondary | ICD-10-CM | POA: Diagnosis not present

## 2023-03-18 DIAGNOSIS — F0631 Mood disorder due to known physiological condition with depressive features: Secondary | ICD-10-CM | POA: Diagnosis not present

## 2023-03-21 LAB — URINE CULTURE
MICRO NUMBER:: 15854940
SPECIMEN QUALITY:: ADEQUATE

## 2023-03-22 ENCOUNTER — Encounter: Payer: Self-pay | Admitting: Family Medicine

## 2023-03-22 ENCOUNTER — Ambulatory Visit (INDEPENDENT_AMBULATORY_CARE_PROVIDER_SITE_OTHER): Payer: Medicare HMO | Admitting: Family Medicine

## 2023-03-22 VITALS — BP 108/67 | HR 59 | Temp 97.0°F | Ht 73.0 in | Wt 212.2 lb

## 2023-03-22 DIAGNOSIS — N390 Urinary tract infection, site not specified: Secondary | ICD-10-CM

## 2023-03-22 DIAGNOSIS — E1169 Type 2 diabetes mellitus with other specified complication: Secondary | ICD-10-CM

## 2023-03-22 DIAGNOSIS — F321 Major depressive disorder, single episode, moderate: Secondary | ICD-10-CM | POA: Diagnosis not present

## 2023-03-22 MED ORDER — CEPHALEXIN 500 MG PO CAPS: 500.0000 mg | ORAL_CAPSULE | Freq: Two times a day (BID) | ORAL | 0 refills | Status: AC

## 2023-03-22 NOTE — Assessment & Plan Note (Signed)
Overall symptoms are well-controlled.  He is following with psychiatry and is on Zoloft 25 mg daily and Wellbutrin 150 mg daily.  He does have slight tremor in his hands which is tolerable.  Discussed with patient this may be due to a side effect of his Wellbutrin however given his good control would be hesitant to make any changes today.  He is agreeable to this.  He will let us know if symptoms worsen.  He can continue management per psychiatry.

## 2023-03-22 NOTE — Progress Notes (Signed)
   Victor Huber is a 72 y.o. male who presents today for an office visit.  Assessment/Plan:  New/Acute Problems: Urinary tract infection Recent culture confirmed urinary tract infection sensitive to Keflex.  His symptoms have improved significantly and h will finish his course of Keflex.  Discussed with patient he has a high risk for recurrence due to his comorbidities and being on synjardy.  Additionally, he was admitted for sepsis secondary to URI just a couple of months ago.  We discussed strategies to help prevent UTIs.    It would be reasonable for him to have a prescription at home to start in case he develops symptoms of UTI.  We will send in pocket prescription for Keflex 500 mg twice daily with instruction not start unless he develops symptoms of UTI.  Will also send in urine specimen cup so that he can ideally collect a sample before starting antibiotics.  He will let us know if this happens via MyChart or phone call and we can run a urine culture.  Chronic Problems Addressed Today: Depression, major, single episode, moderate (HCC) Overall symptoms are well-controlled.  He is following with psychiatry and is on Zoloft 25 mg daily and Wellbutrin 150 mg daily.  He does have slight tremor in his hands which is tolerable.  Discussed with patient this may be due to a side effect of his Wellbutrin however given his good control would be hesitant to make any changes today.  He is agreeable to this.  He will let us know if symptoms worsen.  He can continue management per psychiatry.  T2DM (type 2 diabetes mellitus) (HCC) Last A1c 8.0.  He is working on lifestyle interventions.  He is on Synjardy 5-500 twice daily.  This does increase his risk for UTI.  Will recheck A1c when he comes back in a couple of months.  Too early to recheck today.  If he does have continued issues with recurrent urinary tract infection, will need to stop empagliflozin.      Subjective:  HPI:  See A/P for status  of chronic conditions.  Patient is here today for follow-up.  I saw him last about a month ago.  Since our last visit he developed symptoms consistent with previous UTI.  He came in for urine specimen.  We empirically started him on Keflex 500 mg twice daily.  Symptoms have improved significantly. No fevers or chills.        Objective:  Physical Exam: BP 108/67   Pulse (!) 59   Temp (!) 97 F (36.1 C) (Temporal)   Ht 6\' 1"  (1.854 m)   Wt 212 lb 3.2 oz (96.3 kg)   SpO2 97%   BMI 28.00 kg/m   Gen: No acute distress, resting comfortably HEENT: tympanic membranes clear bilaterally.  CV: Regular rate and rhythm with no murmurs appreciated Pulm: Normal work of breathing, clear to auscultation bilaterally with no crackles, wheezes, or rhonchi Neuro: Grossly normal, moves all extremities Psych: Normal affect and thought content      [name] M. Jimmey Ralph, MD 03/22/2023 2:33 PM

## 2023-03-22 NOTE — Patient Instructions (Signed)
It was very nice to see you today!  I am glad that you are feeling better.  Please finish your course of antibiotics.  There is a chance you may have recurrent UTI in the future.  I will send in a prescription for Keflex.  Please do not start this unless you start to develop symptoms of UTI.  I will also send in a urine specimen cup.  Please obtain a urine specimen before starting antibiotics if possible and office at our office so that we can check a urine culture.  Return if symptoms worsen or fail to improve.   Take care, Dr Jimmey Ralph  PLEASE NOTE:  If you had any lab tests, please let us know if you have not heard back within a few days. You may see your results on mychart before we have a chance to review them but we will give you a call once they are reviewed by [place].   If we ordered any referrals today, please let us know if you have not heard from their office within the next week.   If you had any urgent prescriptions sent in today, please check with the pharmacy within an hour of our visit to make sure the prescription was transmitted appropriately.   Please try these tips to maintain a healthy lifestyle:  Eat at least 3 REAL meals and 1-2 snacks per day.  Aim for no more than 5 hours between eating.  If you eat breakfast, please do so within one hour of getting up.   Each meal should contain half fruits/vegetables, one quarter protein, and one quarter carbs (no bigger than a computer mouse)  Cut down on sweet beverages. This includes juice, soda, and sweet tea.   Drink at least 1 glass of water with each meal and aim for at least 8 glasses per day  Exercise at least 150 minutes every week.

## 2023-03-22 NOTE — Progress Notes (Signed)
Urine culture confirms UTI.  The antibiotic we have him on should treat this.  He should let us know if not improving.

## 2023-03-22 NOTE — Assessment & Plan Note (Signed)
Last A1c 8.0.  He is working on lifestyle interventions.  He is on Synjardy 5-500 twice daily.  This does increase his risk for UTI.  Will recheck A1c when he comes back in a couple of months.  Too early to recheck today.  If he does have continued issues with recurrent urinary tract infection, will need to stop empagliflozin.

## 2023-03-27 ENCOUNTER — Other Ambulatory Visit (HOSPITAL_BASED_OUTPATIENT_CLINIC_OR_DEPARTMENT_OTHER): Payer: Self-pay | Admitting: Cardiovascular Disease

## 2023-03-27 ENCOUNTER — Other Ambulatory Visit: Payer: Self-pay | Admitting: Emergency Medicine

## 2023-03-27 ENCOUNTER — Other Ambulatory Visit: Payer: Self-pay | Admitting: Family Medicine

## 2023-03-27 DIAGNOSIS — N401 Enlarged prostate with lower urinary tract symptoms: Secondary | ICD-10-CM

## 2023-03-29 DIAGNOSIS — F0631 Mood disorder due to known physiological condition with depressive features: Secondary | ICD-10-CM | POA: Diagnosis not present

## 2023-03-29 DIAGNOSIS — F411 Generalized anxiety disorder: Secondary | ICD-10-CM | POA: Diagnosis not present

## 2023-04-01 DIAGNOSIS — F411 Generalized anxiety disorder: Secondary | ICD-10-CM | POA: Diagnosis not present

## 2023-04-01 DIAGNOSIS — F0631 Mood disorder due to known physiological condition with depressive features: Secondary | ICD-10-CM | POA: Diagnosis not present

## 2023-04-08 ENCOUNTER — Other Ambulatory Visit (HOSPITAL_BASED_OUTPATIENT_CLINIC_OR_DEPARTMENT_OTHER): Payer: Self-pay

## 2023-04-12 DIAGNOSIS — F411 Generalized anxiety disorder: Secondary | ICD-10-CM | POA: Diagnosis not present

## 2023-04-12 DIAGNOSIS — F0631 Mood disorder due to known physiological condition with depressive features: Secondary | ICD-10-CM | POA: Diagnosis not present

## 2023-04-22 ENCOUNTER — Telehealth: Payer: Self-pay | Admitting: *Deleted

## 2023-04-22 NOTE — Telephone Encounter (Signed)
Copied from CRM [id number]. Topic: Clinical - Prescription Issue >> Apr 19, 2023  4:33 PM Corin V wrote: Reason for CRM: [facility] called as patientt has a diagnosis of congestive heart failure reduced ejection fraction and is not on an ACE or ARB or ARNI. If there is no contraindication she requested Dr. Jimmey Ralph prescribe one of those medications. If appropriate, please send a script to patient's preferred pharmacy. If there is a contraindication, please update diagnosis with Belenda Cruise    See above note Danielle Lento,RMA

## 2023-04-23 ENCOUNTER — Ambulatory Visit: Payer: Medicare HMO | Admitting: Family Medicine

## 2023-04-23 NOTE — Telephone Encounter (Signed)
He can discuss this with his cardiologist.  Katina Degree. Jimmey Ralph, MD 04/23/2023 12:38 PM

## 2023-04-26 DIAGNOSIS — F411 Generalized anxiety disorder: Secondary | ICD-10-CM | POA: Diagnosis not present

## 2023-04-26 DIAGNOSIS — F0631 Mood disorder due to known physiological condition with depressive features: Secondary | ICD-10-CM | POA: Diagnosis not present

## 2023-05-10 DIAGNOSIS — F411 Generalized anxiety disorder: Secondary | ICD-10-CM | POA: Diagnosis not present

## 2023-05-14 DIAGNOSIS — F411 Generalized anxiety disorder: Secondary | ICD-10-CM | POA: Diagnosis not present

## 2023-05-14 DIAGNOSIS — F0631 Mood disorder due to known physiological condition with depressive features: Secondary | ICD-10-CM | POA: Diagnosis not present

## 2023-05-21 ENCOUNTER — Encounter: Payer: Self-pay | Admitting: Family Medicine

## 2023-05-21 ENCOUNTER — Ambulatory Visit: Payer: Medicare HMO | Admitting: Family Medicine

## 2023-05-21 VITALS — BP 106/70 | HR 76 | Temp 97.3°F | Ht 73.0 in | Wt 215.4 lb

## 2023-05-21 DIAGNOSIS — F321 Major depressive disorder, single episode, moderate: Secondary | ICD-10-CM

## 2023-05-21 DIAGNOSIS — R251 Tremor, unspecified: Secondary | ICD-10-CM | POA: Diagnosis not present

## 2023-05-21 DIAGNOSIS — E1169 Type 2 diabetes mellitus with other specified complication: Secondary | ICD-10-CM

## 2023-05-21 DIAGNOSIS — Z7984 Long term (current) use of oral hypoglycemic drugs: Secondary | ICD-10-CM

## 2023-05-21 LAB — POCT GLYCOSYLATED HEMOGLOBIN (HGB A1C): Hemoglobin A1C: 9 % — AB (ref 4.0–5.6)

## 2023-05-21 MED ORDER — SYNJARDY XR 12.5-1000 MG PO TB24: 2.0000 | ORAL_TABLET | Freq: Every day | ORAL | 3 refills | Status: DC

## 2023-05-21 NOTE — Assessment & Plan Note (Signed)
Overall symptoms well-controlled.  Following with psychiatry.  On Zoloft 25 mg daily and Wellbutrin 150 mg daily.  Does have a slight tremor in his hand as above however he does not wish to make any medication changes today.  He will let us know if symptoms change.  Continue management per psychiatry otherwise.

## 2023-05-21 NOTE — Patient Instructions (Signed)
It was very nice to see you today!  Your A1c is 9.  We need to work on getting this lower.  Please increase your Synjardy to the full dose 12.07-998 twice daily.  Let me know in a few weeks how this is working for you.  If your blood sugars still not controlled with this we can consider starting another medication like Ozempic or Mounjaro.  Return in about 3 months (around 08/18/2023).   Take care, Dr Jimmey Ralph  PLEASE NOTE:  If you had any lab tests, please let us know if you have not heard back within a few days. You may see your results on mychart before we have a chance to review them but we will give you a call once they are reviewed by [place].   If we ordered any referrals today, please let us know if you have not heard from their office within the next week.   If you had any urgent prescriptions sent in today, please check with the pharmacy within an hour of our visit to make sure the prescription was transmitted appropriately.   Please try these tips to maintain a healthy lifestyle:  Eat at least 3 REAL meals and 1-2 snacks per day.  Aim for no more than 5 hours between eating.  If you eat breakfast, please do so within one hour of getting up.   Each meal should contain half fruits/vegetables, one quarter protein, and one quarter carbs (no bigger than a computer mouse)  Cut down on sweet beverages. This includes juice, soda, and sweet tea.   Drink at least 1 glass of water with each meal and aim for at least 8 glasses per day  Exercise at least 150 minutes every week.

## 2023-05-21 NOTE — Progress Notes (Signed)
   Victor Huber is a 73 y.o. male who presents today for an office visit.  Assessment/Plan:  Chronic Problems Addressed Today: T2DM (type 2 diabetes mellitus) (HCC) A1c not controlled 9.0.  We discussed lifestyle modifications.  Does admit to a few dietary indiscretion recently with the holidays.  We will increase his Synjardy to 12.07-998 twice daily.  We discussed potential side effects.  He will come back in 3 months to recheck A1c.  If A1c is not at goal if he has side effects with higher dose of Synjardy we will consider starting GLP-1 agonist such as Mounjaro or Ozempic.  Tremor No change since her last visit.  Potentially due to a side effect of Wellbutrin.  We did discuss trying off Wellbutrin however he declined.  Symptoms are not bothersome.  He will let us know if he has any change in symptoms between now and her next visit.  Depression, major, single episode, moderate (HCC) Overall symptoms well-controlled.  Following with psychiatry.  On Zoloft 25 mg daily and Wellbutrin 150 mg daily.  Does have a slight tremor in his hand as above however he does not wish to make any medication changes today.  He will let us know if symptoms change.  Continue management per psychiatry otherwise.     Subjective:  HPI:  See assessment / plan for status of chronic conditions.  HE has no acute concerns today.        Objective:  Physical Exam: BP 106/70   Pulse 76   Temp (!) 97.3 F (36.3 C) (Temporal)   Ht 6\' 1"  (1.854 m)   Wt 215 lb 6.4 oz (97.7 kg)   SpO2 98%   BMI 28.42 kg/m   Gen: No acute distress, resting comfortably CV: Regular rate and rhythm with no murmurs appreciated Pulm: Normal work of breathing, clear to auscultation bilaterally with no crackles, wheezes, or rhonchi Neuro: Grossly normal, moves all extremities Psych: Normal affect and thought content      Zane Pellecchia M. Jimmey Ralph, MD 05/21/2023 2:47 PM

## 2023-05-21 NOTE — Addendum Note (Signed)
Addended by: Dyann Kief on: 05/21/2023 02:53 PM   Modules accepted: Orders

## 2023-05-21 NOTE — Assessment & Plan Note (Signed)
A1c not controlled 9.0.  We discussed lifestyle modifications.  Does admit to a few dietary indiscretion recently with the holidays.  We will increase his Synjardy to 12.07-998 twice daily.  We discussed potential side effects.  He will come back in 3 months to recheck A1c.  If A1c is not at goal if he has side effects with higher dose of Synjardy we will consider starting GLP-1 agonist such as Mounjaro or Ozempic.

## 2023-05-21 NOTE — Assessment & Plan Note (Signed)
No change since her last visit.  Potentially due to a side effect of Wellbutrin.  We did discuss trying off Wellbutrin however he declined.  Symptoms are not bothersome.  He will let us know if he has any change in symptoms between now and her next visit.

## 2023-05-31 ENCOUNTER — Telehealth: Payer: Self-pay | Admitting: Internal Medicine

## 2023-05-31 NOTE — Telephone Encounter (Signed)
 Rescheduled appointment. Patient is aware of the changes made.

## 2023-06-04 DIAGNOSIS — R69 Illness, unspecified: Secondary | ICD-10-CM | POA: Diagnosis not present

## 2023-06-21 ENCOUNTER — Other Ambulatory Visit (HOSPITAL_BASED_OUTPATIENT_CLINIC_OR_DEPARTMENT_OTHER): Payer: Self-pay | Admitting: Cardiovascular Disease

## 2023-06-27 ENCOUNTER — Ambulatory Visit (HOSPITAL_BASED_OUTPATIENT_CLINIC_OR_DEPARTMENT_OTHER): Payer: Medicare HMO | Admitting: Cardiovascular Disease

## 2023-06-27 ENCOUNTER — Encounter (HOSPITAL_BASED_OUTPATIENT_CLINIC_OR_DEPARTMENT_OTHER): Payer: Self-pay | Admitting: Cardiovascular Disease

## 2023-06-27 VITALS — BP 100/60 | HR 117 | Ht 73.0 in | Wt 215.1 lb

## 2023-06-27 DIAGNOSIS — Z5181 Encounter for therapeutic drug level monitoring: Secondary | ICD-10-CM

## 2023-06-27 DIAGNOSIS — I482 Chronic atrial fibrillation, unspecified: Secondary | ICD-10-CM | POA: Diagnosis not present

## 2023-06-27 DIAGNOSIS — I5042 Chronic combined systolic (congestive) and diastolic (congestive) heart failure: Secondary | ICD-10-CM | POA: Diagnosis not present

## 2023-06-27 DIAGNOSIS — E785 Hyperlipidemia, unspecified: Secondary | ICD-10-CM | POA: Diagnosis not present

## 2023-06-27 NOTE — Patient Instructions (Signed)
 Medication Instructions:  Your physician recommends that you continue on your current medications as directed. Please refer to the Current Medication list given to you today.   *If you need a refill on your cardiac medications before your next appointment, please call your pharmacy*  Lab Work: FASTING LP/CMET SOON   If you have labs (blood work) drawn today and your tests are completely normal, you will receive your results only by: MyChart Message (if you have MyChart) OR A paper copy in the mail If you have any lab test that is abnormal or we need to change your treatment, we will call you to review the results.  Testing/Procedures: Your physician has requested that you have an echocardiogram. Echocardiography is a painless test that uses sound waves to create images of your heart. It provides your doctor with information about the size and shape of your heart and how well your heart's chambers and valves are working. This procedure takes approximately one hour. There are no restrictions for this procedure. Please do NOT wear cologne, perfume, aftershave, or lotions (deodorant is allowed). Please arrive 15 minutes prior to your appointment time.  Please note: We ask at that you not bring children with you during ultrasound (echo/ vascular) testing. Due to room size and safety concerns, children are not allowed in the ultrasound rooms during exams. Our front office staff cannot provide observation of children in our lobby area while testing is being conducted. An adult accompanying a patient to their appointment will only be allowed in the ultrasound room at the discretion of the ultrasound technician under special circumstances. We apologize for any inconvenience.   Follow-Up: At [facility], you and your health needs are our priority.  As part of our continuing mission to provide you with exceptional heart care, our providers are all part of one team.  This team includes your  primary Cardiologist (physician) and Advanced Practice Providers or APPs (Physician Assistants and Nurse Practitioners) who all work together to provide you with the care you need, when you need it.  Your next appointment:   6 month(s)  Provider:   Chilton Si, MD, Eligha Bridegroom, NP, or Gillian Shields, NP     We recommend signing up for the patient portal called "MyChart".  Sign up information is provided on this After Visit Summary.  MyChart is used to connect with patients for Virtual Visits (Telemedicine).  Patients are able to view lab/test results, encounter notes, upcoming appointments, etc.  Non-urgent messages can be sent to your provider as well.   To learn more about what you can do with MyChart, go to [email or web address].

## 2023-06-27 NOTE — Progress Notes (Signed)
 Cardiology Office Note:  .   Date:  07/01/2023  ID:  Victor Huber, DOB 1950/08/23, MRN [id number] PCP: Victor Dark, MD  [facility] Providers Cardiologist:  Victor Si, MD    History of Present Illness: Marland Kitchen   Victor Huber is a 73 y.o. male with persistent atrial fibrillation, diabetes, COPD, hypothyroidism and metastatic non-small cell lung cancer s/p resection and on chemotherapy and prior tobacco abuse here for follow up.  He moved from [place] 05/2016 and established care with Victor Huber.  He was diagnosed with atrial fibrillation in 2012.  He has been chronically in atrial fibrillation lately.  In the past he was on amiodarone. However this was discontinued when he was diagnosed with lung cancer in 2016. After that he was switched to sotalol but failed subsequent DCCV.  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  Mr. Huber reported fatigue and shortness of breath. He was switched from metoprolol to atenolol, given that he previously felt better on this medication.  He was also referred to the atrial fibrillation clinic where he discussed using dofetilide, amiodarone, and ablation. He was not felt to be a good candidate for ablation declined antiarrhythmics due to cost.   Victor Huber continues with [facility] therapy.  This was stopped for a while due to pancreatitis. He reported exertional dyspnea and fatigue. He had a nuclear stress test 03/2020 which revealed multiple artifacts but no ischemia. We discussed antiarrythmic options and felt dofetilide was best, but he declined due to the need for hospitalization. He was seen in Afib clinic 12/2020 after he was found to have Afib RDR after an endoscopy procedure. His atenolol had been held that morning. She recommended continuing his current therapy.    Victor Huber reported increased shortness of breath. He was in atrial fibrillation and rates were uncontrolled. Atenolol was switched to metoprolol.  TSH was abnormal. Echo revealed LVEF 40-45% with global hypokinesis and mild LVH. RV function was mildly reduced. He saw Victor Huber 12/2021 and his dyspnea had increased. He switched to bisoprolol and diuresed with Lasix due to volume overload. He followed up 01/2022 and his weight was down 4 pounds. He continued to have exertional dyspnea. He was started on Entresto.  At his visit 05/2022 he continued to report shortness of breath and low energy levels.  He noted that he did not feel any different though he did note a few more palpitations.  Rates remain uncontrolled so digoxin was added.  He followed up with Victor Huber 09/2022 and was doing better.  Improved but he did still noted with his eyes.  Discussed the use of AI scribe software for clinical note transcription with the patient, who gave verbal consent to proceed.  History of Present Illness Victor Huber "Victor Huber" is a 73 year old male with atrial fibrillation and COPD who presents with shortness of breath.  He has been experiencing persistent shortness of breath since his hospitalization for sepsis approximately four to five months ago. The dyspnea primarily occurs with exertion and not at rest or when lying down. He is able to walk around the block but must be cautious to avoid needing his emergency inhaler. His oxygen saturation levels are typically around 90-92% during these episodes. No swelling or orthopnea. Energy levels are low, and there has been no improvement with exercise, which he attributes to his heart rhythm.  He has a history of atrial fibrillation and is currently on digoxin, which  was increased during his hospital stay. Despite this, he continues to experience a rapid heart rate. He is also on Eliquis for atrial fibrillation. His heart rate feels irregular and fast at times, particularly when he is short of breath. He is not on Entresto anymore and is currently taking Synjardy, which contains Jardiance, for his heart  and breathing issues.  He has chronic obstructive pulmonary disease (COPD) and acknowledges that his lung condition contributes to his breathing difficulties. He carries a pulse oximeter and monitors his heart rate and oxygen levels. He uses an emergency inhaler as needed, with oxygen levels around 90-92% during episodes of dyspnea.  He mentions upcoming dental work and inquires about the need to stop Eliquis. He had a tooth extraction recently and was informed that for minor procedures, he can remain on Eliquis, though he may experience increased bleeding.   ROS:  As per HPI  Studies Reviewed: Marland Kitchen   EKG Interpretation Date/Time:  Thursday June 27 2023 15:54:38 EDT Ventricular Rate:  117 PR Interval:    QRS Duration:  116 QT Interval:  356 QTC Calculation: 496 R Axis:   111  Text Interpretation: Atrial fibrillation with rapid ventricular response Right bundle branch block No significant change since last tracing Confirmed by Victor Huber ([id number]) on 06/27/2023 4:03:53 PM   Echo 12/2021: 1. Techinically difficult study. Left ventricular ejection fraction, by  estimation, is 40 to 45%. The left ventricle has mildly decreased  function. The left ventricle demonstrates global hypokinesis. There is  mild left ventricular hypertrophy. Left  ventricular diastolic parameters are indeterminate.   2. Right ventricular systolic function is mildly reduced. The right  ventricular size is moderately enlarged. Tricuspid regurgitation signal is  inadequate for assessing PA pressure.   3. The mitral valve is normal in structure. Trivial mitral valve  regurgitation. No evidence of mitral stenosis.   4. The aortic valve was not well visualized. Aortic valve regurgitation  is not visualized. Aortic valve sclerosis/calcification is present,  without any evidence of aortic stenosis.   5. The inferior vena cava is normal in size with greater than 50%  respiratory variability, suggesting right atrial  pressure of 3 mmHg.   Risk Assessment/Calculations:    CHA2DS2-VASc Score = 4   This indicates a 4.8% annual risk of stroke. The patient's score is based upon: CHF History: 1 HTN History: 0 Diabetes History: 1 Stroke History: 0 Vascular Disease History: 1 Age Score: 1 Gender Score: 0         Physical Exam:   VS:  BP 100/60   Pulse (!) 117   Ht 6\' 1"  (1.854 m)   Wt 215 lb 1.6 oz (97.6 kg)   SpO2 97%   BMI 28.38 kg/m  , BMI Body mass index is 28.38 kg/m. GENERAL:  Well appearing HEENT: Pupils equal round and reactive, fundi not visualized, oral mucosa unremarkable NECK:  No jugular venous distention, waveform within normal limits, carotid upstroke brisk and symmetric, no bruits, no thyromegaly LUNGS:  Clear to auscultation bilaterally HEART:  Tachycardic.  Irregularly irregular.  PMI not displaced or sustained,S1 and S2 within normal limits, no S3, no S4, no clicks, no rubs, no murmurs ABD:  Flat, positive bowel sounds normal in frequency in pitch, no bruits, no rebound, no guarding, no midline pulsatile mass, no hepatomegaly, no splenomegaly EXT:  2 plus pulses throughout, no edema, no cyanosis no clubbing SKIN:  No rashes no nodules NEURO:  Cranial nerves II through XII grossly intact, motor grossly intact  throughout [facility]:  Cognitively intact, oriented to person place and time   ASSESSMENT AND PLAN: .    Assessment & Plan # Atrial Fibrillation with Rapid Ventricular Response Chronic atrial fibrillation with rapid ventricular response causing dyspnea and fatigue. Heart rate remains elevated despite increased digoxin. Hypotension limits medication options. Discussed referral to AFib clinic for further management, including ablation techniques or ablate and pace strategy.  - Refer to AFib clinic for evaluation of potential ablation or other management options. - Continue current medications including digoxin and Eliquis.  # Chronic Obstructive Pulmonary Disease  (COPD) COPD contributing to exertional dyspnea. Oxygen saturation drops to 90-92% during exertion.  # General Follow-up He is content with current management and declines aggressive interventions. Will monitor for changes in symptoms or condition. - Schedule echocardiogram to assess cardiac function. - Order fasting blood work for lipid profile. - Follow up in 6 months unless symptoms change.  Dispo: f/u in 6 months.  EP referral   Signed, Victor Si, MD

## 2023-07-02 ENCOUNTER — Other Ambulatory Visit: Payer: Self-pay | Admitting: Family Medicine

## 2023-07-18 ENCOUNTER — Ambulatory Visit (INDEPENDENT_AMBULATORY_CARE_PROVIDER_SITE_OTHER)

## 2023-07-18 DIAGNOSIS — I5042 Chronic combined systolic (congestive) and diastolic (congestive) heart failure: Secondary | ICD-10-CM | POA: Diagnosis not present

## 2023-07-18 DIAGNOSIS — I482 Chronic atrial fibrillation, unspecified: Secondary | ICD-10-CM

## 2023-07-18 DIAGNOSIS — E785 Hyperlipidemia, unspecified: Secondary | ICD-10-CM | POA: Diagnosis not present

## 2023-07-18 DIAGNOSIS — Z5181 Encounter for therapeutic drug level monitoring: Secondary | ICD-10-CM | POA: Diagnosis not present

## 2023-07-19 ENCOUNTER — Other Ambulatory Visit (HOSPITAL_BASED_OUTPATIENT_CLINIC_OR_DEPARTMENT_OTHER): Payer: Self-pay | Admitting: Cardiovascular Disease

## 2023-07-19 DIAGNOSIS — I4891 Unspecified atrial fibrillation: Secondary | ICD-10-CM

## 2023-07-19 LAB — ECHOCARDIOGRAM COMPLETE
AR max vel: 1.94 cm2
AV Area VTI: 2.47 cm2
AV Area mean vel: 2.12 cm2
AV Mean grad: 5 mmHg
AV Peak grad: 10.2 mmHg
Ao pk vel: 1.6 m/s
Area-P 1/2: 3.13 cm2
S' Lateral: 3.51 cm

## 2023-07-19 LAB — COMPREHENSIVE METABOLIC PANEL WITH GFR
ALT: 26 IU/L (ref 0–44)
AST: 25 IU/L (ref 0–40)
Albumin: 4.3 g/dL (ref 3.8–4.8)
Alkaline Phosphatase: 85 IU/L (ref 44–121)
BUN/Creatinine Ratio: 20 (ref 10–24)
BUN: 20 mg/dL (ref 8–27)
Bilirubin Total: 0.4 mg/dL (ref 0.0–1.2)
CO2: 24 mmol/L (ref 20–29)
Calcium: 9.8 mg/dL (ref 8.6–10.2)
Chloride: 101 mmol/L (ref 96–106)
Creatinine, Ser: 0.98 mg/dL (ref 0.76–1.27)
Globulin, Total: 2.2 g/dL (ref 1.5–4.5)
Glucose: 120 mg/dL — ABNORMAL HIGH (ref 70–99)
Potassium: 5.2 mmol/L (ref 3.5–5.2)
Sodium: 140 mmol/L (ref 134–144)
Total Protein: 6.5 g/dL (ref 6.0–8.5)
eGFR: 81 mL/min/{1.73_m2} (ref >59)

## 2023-07-19 LAB — LIPID PANEL
Chol/HDL Ratio: 3.2 ratio (ref 0.0–5.0)
Cholesterol, Total: 115 mg/dL (ref 100–199)
HDL: 36 mg/dL — ABNORMAL LOW (ref >39)
LDL Chol Calc (NIH): 48 mg/dL (ref 0–99)
Triglycerides: 187 mg/dL — ABNORMAL HIGH (ref 0–149)
VLDL Cholesterol Cal: 31 mg/dL (ref 5–40)

## 2023-07-23 ENCOUNTER — Ambulatory Visit (HOSPITAL_BASED_OUTPATIENT_CLINIC_OR_DEPARTMENT_OTHER)

## 2023-07-23 DIAGNOSIS — I4891 Unspecified atrial fibrillation: Secondary | ICD-10-CM

## 2023-07-23 LAB — ECHOCARDIOGRAM LIMITED

## 2023-07-23 MED ORDER — PERFLUTREN LIPID MICROSPHERE
1.0000 mL | INTRAVENOUS | Status: AC | PRN
  Administered 2023-07-23: 1 mL via INTRAVENOUS

## 2023-07-29 ENCOUNTER — Encounter (HOSPITAL_BASED_OUTPATIENT_CLINIC_OR_DEPARTMENT_OTHER): Payer: Self-pay | Admitting: Cardiovascular Disease

## 2023-07-30 MED ORDER — BISOPROLOL FUMARATE 5 MG PO TABS: 5.0000 mg | ORAL_TABLET | Freq: Every day | ORAL | 3 refills | Status: DC

## 2023-08-07 ENCOUNTER — Other Ambulatory Visit (HOSPITAL_BASED_OUTPATIENT_CLINIC_OR_DEPARTMENT_OTHER): Payer: Self-pay

## 2023-08-10 ENCOUNTER — Encounter (HOSPITAL_BASED_OUTPATIENT_CLINIC_OR_DEPARTMENT_OTHER): Payer: Self-pay | Admitting: Cardiovascular Disease

## 2023-08-10 DIAGNOSIS — I482 Chronic atrial fibrillation, unspecified: Secondary | ICD-10-CM

## 2023-08-12 ENCOUNTER — Ambulatory Visit (HOSPITAL_COMMUNITY)
Admission: RE | Admit: 2023-08-12 | Discharge: 2023-08-12 | Disposition: A | Discharge status: Home / Self Care | Source: Ambulatory Visit | Attending: Internal Medicine | Admitting: Internal Medicine

## 2023-08-12 ENCOUNTER — Other Ambulatory Visit: Payer: Self-pay | Admitting: Internal Medicine

## 2023-08-12 ENCOUNTER — Encounter (HOSPITAL_COMMUNITY): Payer: Self-pay

## 2023-08-12 ENCOUNTER — Inpatient Hospital Stay: Payer: Medicare HMO | Attending: Internal Medicine

## 2023-08-12 DIAGNOSIS — C7951 Secondary malignant neoplasm of bone: Secondary | ICD-10-CM | POA: Diagnosis not present

## 2023-08-12 DIAGNOSIS — C3490 Malignant neoplasm of unspecified part of unspecified bronchus or lung: Secondary | ICD-10-CM | POA: Insufficient documentation

## 2023-08-12 DIAGNOSIS — J439 Emphysema, unspecified: Secondary | ICD-10-CM | POA: Diagnosis not present

## 2023-08-12 DIAGNOSIS — C3432 Malignant neoplasm of lower lobe, left bronchus or lung: Secondary | ICD-10-CM | POA: Diagnosis not present

## 2023-08-12 DIAGNOSIS — K8020 Calculus of gallbladder without cholecystitis without obstruction: Secondary | ICD-10-CM | POA: Diagnosis not present

## 2023-08-12 DIAGNOSIS — D3501 Benign neoplasm of right adrenal gland: Secondary | ICD-10-CM | POA: Diagnosis not present

## 2023-08-12 LAB — CMP (CANCER CENTER ONLY)
ALT: 22 U/L (ref 0–44)
AST: 21 U/L (ref 15–41)
Albumin: 4.4 g/dL (ref 3.5–5.0)
Alkaline Phosphatase: 75 U/L (ref 38–126)
Anion gap: 5 (ref 5–15)
BUN: 22 mg/dL (ref 8–23)
CO2: 29 mmol/L (ref 22–32)
Calcium: 9 mg/dL (ref 8.9–10.3)
Chloride: 104 mmol/L (ref 98–111)
Creatinine: 0.95 mg/dL (ref 0.61–1.24)
GFR, Estimated: >60 mL/min (ref >60)
Glucose, Bld: 141 mg/dL — ABNORMAL HIGH (ref 70–99)
Potassium: 4.3 mmol/L (ref 3.5–5.1)
Sodium: 138 mmol/L (ref 135–145)
Total Bilirubin: 0.6 mg/dL (ref 0.0–1.2)
Total Protein: 6.9 g/dL (ref 6.5–8.1)

## 2023-08-12 LAB — CBC WITH DIFFERENTIAL (CANCER CENTER ONLY)
Abs Immature Granulocytes: 0.01 10*3/uL (ref 0.00–0.07)
Basophils Absolute: 0 10*3/uL (ref 0.0–0.1)
Basophils Relative: 0 %
Eosinophils Absolute: 0.1 10*3/uL (ref 0.0–0.5)
Eosinophils Relative: 1 %
HCT: 44.2 % (ref 39.0–52.0)
Hemoglobin: 15.7 g/dL (ref 13.0–17.0)
Immature Granulocytes: 0 %
Lymphocytes Relative: 23 %
Lymphs Abs: 1.7 10*3/uL (ref 0.7–4.0)
MCH: 33.3 pg (ref 26.0–34.0)
MCHC: 35.5 g/dL (ref 30.0–36.0)
MCV: 93.6 fL (ref 80.0–100.0)
Monocytes Absolute: 0.5 10*3/uL (ref 0.1–1.0)
Monocytes Relative: 6 %
Neutro Abs: 5.4 10*3/uL (ref 1.7–7.7)
Neutrophils Relative %: 70 %
Platelet Count: 172 10*3/uL (ref 150–400)
RBC: 4.72 MIL/uL (ref 4.22–5.81)
RDW: 13.7 % (ref 11.5–15.5)
WBC Count: 7.7 10*3/uL (ref 4.0–10.5)
nRBC: 0 % (ref 0.0–0.2)

## 2023-08-12 MED ORDER — IOHEXOL 300 MG/ML  SOLN
100.0000 mL | Freq: Once | INTRAMUSCULAR | Status: AC | PRN
  Administered 2023-08-12: 100 mL via INTRAVENOUS

## 2023-08-12 NOTE — Telephone Encounter (Signed)
 Recommend referral to AFib clinic. Dyspnea was noted during his last clinic visit and likely multifactorial COPD, persistent atrial fibrillation. Echocardiogram with no clear cause for dyspnea.   Adrean Findlay S Onesha Krebbs, NP

## 2023-08-12 NOTE — Telephone Encounter (Signed)
 Please review and advise.

## 2023-08-13 DIAGNOSIS — F411 Generalized anxiety disorder: Secondary | ICD-10-CM | POA: Diagnosis not present

## 2023-08-13 DIAGNOSIS — F0631 Mood disorder due to known physiological condition with depressive features: Secondary | ICD-10-CM | POA: Diagnosis not present

## 2023-08-19 ENCOUNTER — Ambulatory Visit (INDEPENDENT_AMBULATORY_CARE_PROVIDER_SITE_OTHER): Payer: Medicare HMO | Admitting: Family Medicine

## 2023-08-19 ENCOUNTER — Encounter: Payer: Self-pay | Admitting: Family Medicine

## 2023-08-19 VITALS — BP 104/64 | HR 84 | Temp 97.3°F | Ht 73.0 in | Wt 212.6 lb

## 2023-08-19 DIAGNOSIS — E1169 Type 2 diabetes mellitus with other specified complication: Secondary | ICD-10-CM | POA: Diagnosis not present

## 2023-08-19 DIAGNOSIS — R251 Tremor, unspecified: Secondary | ICD-10-CM | POA: Diagnosis not present

## 2023-08-19 DIAGNOSIS — Z7984 Long term (current) use of oral hypoglycemic drugs: Secondary | ICD-10-CM

## 2023-08-19 DIAGNOSIS — F321 Major depressive disorder, single episode, moderate: Secondary | ICD-10-CM

## 2023-08-19 LAB — POCT GLYCOSYLATED HEMOGLOBIN (HGB A1C): Hemoglobin A1C: 7.8 % — AB (ref 4.0–5.6)

## 2023-08-19 MED ORDER — MUPIROCIN 2 % EX OINT: 1.0000 | TOPICAL_OINTMENT | Freq: Two times a day (BID) | CUTANEOUS | 0 refills | Status: AC

## 2023-08-19 MED ORDER — DOXYCYCLINE HYCLATE 100 MG PO TABS: 100.0000 mg | ORAL_TABLET | Freq: Two times a day (BID) | ORAL | 0 refills | Status: DC

## 2023-08-19 NOTE — Assessment & Plan Note (Signed)
 Overall mood is very stable on sertraline 25 mg daily and Wellbutrin  150 mg daily.  There is concerned that the Wellbutrin  may be causing some issues with hand tremor.  He is willing to try off this for a few weeks to see if this helps with his tremor.  We did discuss that this may cause worsening of his mood however he and his wife will monitor this closely.  Advised patient to go to Wellbutrin  150 mg every other day for 7 to 10 days and then discontinue.  They will follow-up with us  in a couple of weeks via MyChart.  If tremor persists then he can go back to Wellbutrin .  If it does appear that his tremor is due to the Wellbutrin  we could consider trial of other antidepressant or increasing his dose of Zoloft.

## 2023-08-19 NOTE — Assessment & Plan Note (Signed)
 A1c improved and is 7.8.  Doing well with the Synjardy  12.07-998 twice daily.  He is working on lifestyle interventions as well.  Recheck A1c in 3 months.  We could consider starting GLP-1 agonist at some point in the future if A1c is not at goal.

## 2023-08-19 NOTE — Progress Notes (Signed)
 Victor Huber is a 73 y.o. male who presents today for an office visit.  Assessment/Plan:  New/Acute Problems: Paronychia No red flags.  Improving with over-the-counter antibiotic ointment.  Will start topical mupirocin .  Given his history of diabetes we will also send in a pocket prescription for doxycycline  that they can start if not improving over the next few days.  We discussed reasons to return to care.  No areas amenable to I&D today.  Chronic Problems Addressed Today: T2DM (type 2 diabetes mellitus) (HCC) A1c improved and is 7.8.  Doing well with the Synjardy  12.07-998 twice daily.  He is working on lifestyle interventions as well.  Recheck A1c in 3 months.  We could consider starting GLP-1 agonist at some point in the future if A1c is not at goal.  Tremor He has had slight progression of tremor since our last visit.  Last we discussed before this may be a side effect of his Wellbutrin .  He is interested in weaning off of this the next couple weeks to see if this improves with his symptoms.  See depression A/P.  He will follow-up with us  in a few weeks via MyChart.  Will refer to neurology today though if symptoms resolve after coming off Wellbutrin  he can cancel this appointment.  Depression, major, single episode, moderate (HCC) Overall mood is very stable on sertraline 25 mg daily and Wellbutrin  150 mg daily.  There is concerned that the Wellbutrin  may be causing some issues with hand tremor.  He is willing to try off this for a few weeks to see if this helps with his tremor.  We did discuss that this may cause worsening of his mood however he and his wife will monitor this closely.  Advised patient to go to Wellbutrin  150 mg every other day for 7 to 10 days and then discontinue.  They will follow-up with us  in a couple of weeks via MyChart.  If tremor persists then he can go back to Wellbutrin .  If it does appear that his tremor is due to the Wellbutrin  we could consider trial of  other antidepressant or increasing his dose of Zoloft.     Subjective:  HPI:  See assessment / plan for status of chronic conditions.  Patient is here today for follow-up.  I saw him 3 months ago.  At that time A1c was 9.0.  We increased Synjardy  to 12.07-998 twice daily. He is doing well with this.  He has had continued tremor in his right hand since our last visit.  He thinks it may have gotten a little bit worse.  Symptoms are not bothersome at rest though it has made it more difficult for him to write and type.  No issues with feeding himself.  He also has noticed some pain and inflammation in his right toe.  Started several days ago.  Has used some antibiotic ointment to the area with improvement.  They did have yellowish bloody discharge from the area couple days ago as well.       Objective:  Physical Exam: BP 104/64   Pulse 84   Temp (!) 97.3 F (36.3 C) (Temporal)   Ht 6\' 1"  (1.854 m)   Wt 212 lb 9.6 oz (96.4 kg)   SpO2 94%   BMI 28.05 kg/m   Gen: No acute distress, resting comfortably CV: irregular with no murmurs appreciated Pulm: Normal work of breathing, clear to auscultation bilaterally with no crackles, wheezes, or rhonchi MUSCULOSKELETAL: - Right Foot:  Base of right great toenail erythematous.  Nontender to palpation.  No drainage present.  Neurovascular intact distally. Neuro: Grossly normal, moves all extremities.  Faint tremor noted in right upper extremity with intention.  Minimal tremor at rest. Psych: Normal affect and thought content      Aurelio Mccamy M. Daneil Dunker, MD 08/19/2023 2:25 PM

## 2023-08-19 NOTE — Assessment & Plan Note (Signed)
 He has had slight progression of tremor since our last visit.  Last we discussed before this may be a side effect of his Wellbutrin .  He is interested in weaning off of this the next couple weeks to see if this improves with his symptoms.  See depression A/P.  He will follow-up with us  in a few weeks via MyChart.  Will refer to neurology today though if symptoms resolve after coming off Wellbutrin  he can cancel this appointment.

## 2023-08-19 NOTE — Patient Instructions (Addendum)
 It was very nice to see you today!  Your sugar is improving.  Please continue your current dose of Synjardy .  We will recheck in 3 months.  You can wean off the Wellbutrin  over the next 1 to 2 weeks.  Please take Wellbutrin  every other day.  Let me know in a few weeks how you are doing.  If your tremor is not improving we will have you follow-up with neurology.  Use the mupirocin  on your toe for the next week.  Start the doxycycline  if not improving.  Return in about 3 months (around 11/19/2023).   Take care, Dr Daneil Dunker  PLEASE NOTE:  If you had any lab tests, please let us  know if you have not heard back within a few days. You may see your results on mychart before we have a chance to review them but we will give you a call once they are reviewed by us .   If we ordered any referrals today, please let us  know if you have not heard from their office within the next week.   If you had any urgent prescriptions sent in today, please check with the pharmacy within an hour of our visit to make sure the prescription was transmitted appropriately.   Please try these tips to maintain a healthy lifestyle:  Eat at least 3 REAL meals and 1-2 snacks per day.  Aim for no more than 5 hours between eating.  If you eat breakfast, please do so within one hour of getting up.   Each meal should contain half fruits/vegetables, one quarter protein, and one quarter carbs (no bigger than a computer mouse)  Cut down on sweet beverages. This includes juice, soda, and sweet tea.   Drink at least 1 glass of water  with each meal and aim for at least 8 glasses per day  Exercise at least 150 minutes every week.

## 2023-08-20 ENCOUNTER — Inpatient Hospital Stay: Payer: Medicare HMO | Admitting: Internal Medicine

## 2023-08-20 ENCOUNTER — Ambulatory Visit: Payer: Medicare HMO | Admitting: Internal Medicine

## 2023-08-20 ENCOUNTER — Encounter: Payer: Self-pay | Admitting: Internal Medicine

## 2023-08-20 VITALS — BP 112/63 | HR 71 | Temp 98.0°F | Resp 17 | Ht 73.0 in | Wt 214.6 lb

## 2023-08-20 DIAGNOSIS — C3492 Malignant neoplasm of unspecified part of left bronchus or lung: Secondary | ICD-10-CM | POA: Diagnosis not present

## 2023-08-20 DIAGNOSIS — C3490 Malignant neoplasm of unspecified part of unspecified bronchus or lung: Secondary | ICD-10-CM | POA: Diagnosis not present

## 2023-08-20 DIAGNOSIS — C3432 Malignant neoplasm of lower lobe, left bronchus or lung: Secondary | ICD-10-CM | POA: Diagnosis not present

## 2023-08-20 DIAGNOSIS — C7951 Secondary malignant neoplasm of bone: Secondary | ICD-10-CM | POA: Diagnosis not present

## 2023-08-20 LAB — MICROALBUMIN / CREATININE URINE RATIO
Creatinine,U: 49.9 mg/dL
Microalb Creat Ratio: UNDETERMINED mg/g (ref 0.0–30.0)
Microalb, Ur: <0.7 mg/dL

## 2023-08-20 NOTE — Progress Notes (Signed)
 [facility] [phone]   [phone]  OFFICE PROGRESS NOTE  Victor Clamp, Huber [address] [place] [address]  DIAGNOSIS: Metastatic non-small cell lung cancer initially diagnosed as stage IIIA (T2a, N2, M0) non-small cell lung cancer, poorly differentiated adenocarcinoma presented with left lower lobe lung mass in addition to mediastinal lymphadenopathy.  The patient was diagnosed with metastatic disease involving the left femur as well as left supraclavicular nodal metastases and right paratracheal lymphadenopathy in October 2018.  Biomarker Findings Microsatellite Status - MS-Stable Tumor Mutational Burden - TMB-Low (3 Muts/Mb) Genomic Findings For a complete list of the genes assayed, please refer to the Appendix. STK11 P281fs*6 CDKN2A p14ARF splice site 193+1G>T DAXX E374* MLL2 V4573fs*40 NBN K233fs*5 NOTCH2 R1410H PMS2 splice site 988+1G>A 8 Disease relevant genes with no reportable alterations: EGFR, KRAS, ALK, BRAF, MET, RET, ERBB2, ROS1   PDL1 expression 5%  PRIOR THERAPY: 1) status post wedge resection of the left lower lobe lung mass as well as AP window lymph node dissection but there was residual metastatic mediastinal lymphadenopathy that could not be resected. 2) a course of concurrent chemoradiation with weekly carboplatin  and paclitaxel in [place]  completed 03/01/2015.  3) status post palliative radiotherapy to the left femur metastatic bone disease. 4)  Systemic chemotherapy with carboplatin  for AUC of 5, Alimta  500 mg/M2 and Keytruda  200 mg IV every 3 weeks.  First dose March 07, 2017.  Carboplatin  was discontinued during cycle #2 secondary to hypersensitivity reaction. 5) status post 2 cycles of maintenance treatment with Alimta  and Ketruda (pembrolizumab ).  Alimta  was discontinued secondary to intolerance. 6) Maintenance treatment with single agent Ketruda (pembrolizumab ) status post 49  cycles.  His treatment has  been on hold since January 2022 secondary to intolerance.  CURRENT THERAPY: Observation.  INTERVAL HISTORY: Victor Huber 73 y.o. male returns to the clinic today for 79-month follow-up visit accompanied by his wife.Discussed the use of AI scribe software for clinical note transcription with the patient, who gave verbal consent to proceed.  History of Present Illness   Victor CEDERBERG "Athena Bland" is a 72 year old male with metastatic non-small cell lung cancer who presents for evaluation and repeat CT scan for restaging of his disease. He is accompanied by his wife.  He was diagnosed with metastatic non-small cell lung cancer, adenocarcinoma, in October 2018. There are no actionable mutations and a PD-L1 expression of 5%. Initially, he was treated with systemic chemotherapy including carboplatin , Alimta , and Keytruda  for four cycles, followed by maintenance treatment with Alimta  and Keytruda , and then single-agent Keytruda  for a total of 49 cycles. His last dose was administered in January 2022, and he has been on observation since then.  Things have been going well since his last visit six months ago, with no new developments in his condition. He remains active around the house, completing various tasks such as planting flowers and fixing the garage door. He also enjoys reading, sleeping, and watching sports.  He has a history of emphysema, which he refers to as COPD, and is under the care of Dr. Baldwin Levee for this condition. He has not seen Dr. Baldwin Levee recently, possibly due to stability in his condition.  He also mentions having a hernia and plans to visit the AFib clinic in the coming weeks.      MEDICAL HISTORY: Past Medical History:  Diagnosis Date   Adenocarcinoma of left lung, stage 3 (HCC) 08/15/2016   Allergy    Anxiety  Arthritis    Atrial fibrillation (HCC) 08/15/2016   Atrial fibrillation (HCC)    Cancer, metastatic to bone [facility])    lung   Cataract    Waiting to schedule  bilateral cataract surgery   CHF (congestive heart failure) (HCC) 01/04/2022   COPD (chronic obstructive pulmonary disease) (HCC) 08/15/2016   Depression    Diabetes mellitus, type 2 (HCC)    Dysphagia    Dysrhythmia    a-fib   GERD (gastroesophageal reflux disease)    Heart murmur    atenlol for A Fib/Eliquis    History of chemotherapy    History of radiation therapy    Hyperlipidemia    Hypertension    Hypothyroid 08/15/2016   Longstanding persistent atrial fibrillation (HCC) 08/29/2016   Pathologic fracture    left femur   Pneumonitis    S/P TURP 08/15/2016   T2DM (type 2 diabetes mellitus) (HCC) 05/03/2022   Wears glasses    Wears hearing aid in both ears     ALLERGIES:  is allergic to carboplatin , flecainide, and debrox [carbamide peroxide].  MEDICATIONS:  Current Outpatient Medications  Medication Sig Dispense Refill   acetaminophen  (TYLENOL ) 500 MG tablet Take 500 mg by mouth every 8 (eight) hours as needed for moderate pain.     atorvastatin  (LIPITOR) 40 MG tablet TAKE 1 TABLET EVERY DAY 90 tablet 3   bisoprolol  (ZEBETA ) 5 MG tablet Take 1 tablet (5 mg total) by mouth daily. 90 tablet 3   buPROPion  (WELLBUTRIN  XL) 150 MG 24 hr tablet Take 1 tablet (150 mg total) by mouth daily. 90 tablet 3   diazepam  (VALIUM ) 5 MG tablet TAKE 1 TABLET EVERY 12 HOURS AS NEEDED FOR ANXIETY 30 tablet 5   digoxin  (LANOXIN ) 0.125 MG tablet TAKE 1 TABLET EVERY DAY 90 tablet 3   doxycycline  (VIBRA -TABS) 100 MG tablet Take 1 tablet (100 mg total) by mouth 2 (two) times daily. 14 tablet 0   ELIQUIS  5 MG TABS tablet TAKE 1 TABLET TWICE DAILY 180 tablet 3   Empagliflozin-metFORMIN  HCl ER (SYNJARDY  XR) 12.07-998 MG TB24 Take 2 tablets by mouth daily. 90 tablet 3   esomeprazole  (NEXIUM ) 40 MG capsule TAKE 1 CAPSULE TWICE DAILY BEFORE A MEAL 180 capsule 3   gabapentin  (NEURONTIN ) 300 MG capsule TAKE 2 CAPSULES AT BEDTIME AS NEEDED 180 capsule 3   levothyroxine  (SYNTHROID ) 175 MCG tablet TAKE 1  TABLET EVERY DAY 90 tablet 3   lipase/protease/amylase (CREON ) 36000 UNITS CPEP capsule Take 4 capsules po during each meal. Take 1-2 capsules po during each snack.( up to 2 snacks daily). 400 capsule 3   mupirocin  ointment (BACTROBAN ) 2 % Apply 1 Application topically 2 (two) times daily. 22 g 0   polyethylene glycol (MIRALAX  / GLYCOLAX ) 17 g packet Take 17 g by mouth daily as needed. 14 each 0   sertraline (ZOLOFT) 25 MG tablet Take 25 mg by mouth daily.     STIOLTO RESPIMAT  2.5-2.5 MCG/ACT AERS INHALE 2 PUFFS INTO THE LUNGS DAILY. 12 g 3   sucralfate  (CARAFATE ) 1 g tablet TAKE 1 TABLET FOUR TIMES DAILY WITH MEALS AND AT BEDTIME 360 tablet 3   tamsulosin  (FLOMAX ) 0.4 MG CAPS capsule TAKE 1 CAPSULE EVERY DAY 90 capsule 3   No current facility-administered medications for this visit.    SURGICAL HISTORY:  Past Surgical History:  Procedure Laterality Date   BIOPSY  12/21/2019   Procedure: BIOPSY;  Surgeon: [name], Albino Alu., [facility];  Location: [facility] ENDOSCOPY;  Service: Gastroenterology;;  BIOPSY  05/23/2020   Procedure: BIOPSY;  Surgeon: Normie Becton., Huber;  Location: [facility] ENDOSCOPY;  Service: Gastroenterology;;   BRONCHOSCOPY  10/2014   CARDIAC CATHETERIZATION     05/07/12   CARDIOVERSION     x2   COLONOSCOPY     several yrs   DG BIOPSY LUNG Left 10/2014   FNA - Adenocarcinoma    ESOPHAGOGASTRODUODENOSCOPY (EGD) WITH PROPOFOL  N/A 12/21/2019   Procedure: ESOPHAGOGASTRODUODENOSCOPY (EGD) WITH PROPOFOL ;  Surgeon: Normie Becton., Huber;  Location: [facility];  Service: Gastroenterology;  Laterality: N/A;   ESOPHAGOGASTRODUODENOSCOPY (EGD) WITH PROPOFOL  N/A 05/23/2020   Procedure: ESOPHAGOGASTRODUODENOSCOPY (EGD) WITH PROPOFOL ;  Surgeon: Brice Campi Albino Alu., Huber;  Location: [facility] ENDOSCOPY;  Service: Gastroenterology;  Laterality: N/A;   ESOPHAGOGASTRODUODENOSCOPY (EGD) WITH PROPOFOL  N/A 06/30/2020   Procedure: ESOPHAGOGASTRODUODENOSCOPY (EGD) WITH PROPOFOL ;  Surgeon:  Brice Campi Albino Alu., Huber;  Location: [facility] ENDOSCOPY;  Service: Gastroenterology;  Laterality: N/A;  ultra slim scope avail   EUS N/A 12/21/2019   Procedure: UPPER ENDOSCOPIC ULTRASOUND (EUS) RADIAL;  Surgeon: Normie Becton., Hub[facility];  Location: [facility];  Service: Gastroenterology;  Laterality: N/A;   FEMUR IM NAIL Left 02/19/2017   FEMUR IM NAIL Left 02/19/2017   Procedure: INTRAMEDULLARY (IM) NAIL FEMORAL;  Surgeon: Osa Blase, Huber;  Location: [facility] OR;  Service: Orthopedics;  Laterality: Left;   IR FLUORO GUIDE PORT INSERTION RIGHT  07/03/2017   IR US  GUIDE VASC ACCESS RIGHT  07/03/2017   LUNG CANCER SURGERY Left 12/2014   Wedge Resection    MULTIPLE TOOTH EXTRACTIONS     SAVORY DILATION N/A 05/23/2020   Procedure: SAVORY DILATION;  Surgeon: Normie Becton., Huber;  Location: [facility] ENDOSCOPY;  Service: Gastroenterology;  Laterality: N/A;   SAVORY DILATION N/A 06/30/2020   Procedure: SAVORY DILATION;  Surgeon: Brice Campi Albino Alu., Huber;  Location: [facility] ENDOSCOPY;  Service: Gastroenterology;  Laterality: N/A;   Status post TURP     TONSILLECTOMY     UPPER GASTROINTESTINAL ENDOSCOPY      REVIEW OF SYSTEMS:  Constitutional: positive for fatigue Eyes: negative Ears, nose, mouth, throat, and face: negative Respiratory: positive for dyspnea on exertion Cardiovascular: negative Gastrointestinal: negative Genitourinary:negative Integument/breast: negative Hematologic/lymphatic: negative Musculoskeletal:negative Neurological: negative Behavioral/Psych: negative Endocrine: negative Allergic/Immunologic: negative   PHYSICAL EXAMINATION: General appearance: alert, cooperative, fatigued, and no distress Head: Normocephalic, without obvious abnormality, atraumatic Neck: no adenopathy, no JVD, supple, symmetrical, trachea midline, and thyroid  not enlarged, symmetric, no tenderness/mass/nodules Lymph nodes: Cervical, supraclavicular, and axillary nodes normal. Resp: clear to  auscultation bilaterally Back: symmetric, no curvature. ROM normal. No CVA tenderness. Cardio: regular rate and rhythm, S1, S2 normal, no murmur, click, rub or gallop GI: soft, non-tender; bowel sounds normal; no masses,  no organomegaly Extremities: extremities normal, atraumatic, no cyanosis or edema Neurologic: Alert and oriented X 3, normal strength and tone. Normal symmetric reflexes. Normal coordination and gait  ECOG PERFORMANCE STATUS: 1 - Symptomatic but completely ambulatory  Blood pressure 112/63, pulse 71, temperature 98 F (36.7 C), temperature source Temporal, resp. rate 17, height 6\' 1"  (1.854 m), weight 214 lb 9.6 oz (97.3 kg), SpO2 95%.  LABORATORY DATA: Lab Results  Component Value Date   WBC 7.7 08/12/2023   HGB 15.7 08/12/2023   HCT 44.2 08/12/2023   MCV 93.6 08/12/2023   PLT 172 08/12/2023      Chemistry      Component Value Date/Time   NA 138 08/12/2023 1408   NA 140 07/18/2023 1544   NA 136 04/04/2017 1141  K 4.3 08/12/2023 1408   K 4.4 04/04/2017 1141   CL 104 08/12/2023 1408   CO2 29 08/12/2023 1408   CO2 24 04/04/2017 1141   BUN 22 08/12/2023 1408   BUN 20 07/18/2023 1544   BUN 21.1 04/04/2017 1141   CREATININE 0.95 08/12/2023 1408   CREATININE 1.1 04/04/2017 1141      Component Value Date/Time   CALCIUM  9.0 08/12/2023 1408   CALCIUM  9.1 04/04/2017 1141   ALKPHOS 75 08/12/2023 1408   ALKPHOS 89 04/04/2017 1141   AST 21 08/12/2023 1408   AST 17 04/04/2017 1141   ALT 22 08/12/2023 1408   ALT 18 04/04/2017 1141   BILITOT 0.6 08/12/2023 1408   BILITOT 0.67 04/04/2017 1141       RADIOGRAPHIC STUDIES: CT CHEST ABDOMEN PELVIS W CONTRAST Result Date: 08/14/2023 CLINICAL DATA:  Lung cancer restaging * Tracking Code: BO * EXAM: CT CHEST, ABDOMEN, AND PELVIS WITH CONTRAST TECHNIQUE: Multidetector CT imaging of the chest, abdomen and pelvis was performed following the standard protocol during bolus administration of intravenous contrast.  RADIATION DOSE REDUCTION: This exam was performed according to the departmental dose-optimization program which includes automated exposure control, adjustment of the mA and/or kV according to patient size and/or use of iterative reconstruction technique. CONTRAST:  OMNIPAQUE  IOHEXOL  300 MG/ML  SOLN COMPARISON:  02/15/2023 FINDINGS: CT CHEST FINDINGS Cardiovascular: Right chest port catheter. Aortic atherosclerosis. Cardiomegaly. Left and right coronary artery calcifications. Unchanged small pericardial effusion. Mediastinum/Nodes: Unchanged prominent pretracheal and subcarinal lymph nodes, subcarinal nodes measuring up to 2.3 x 1.4 cm (series 2, image 33). Thyroid  gland, trachea, and esophagus demonstrate no significant findings. Lungs/Pleura: Severe emphysema. Diffuse bilateral bronchial wall thickening. Unchanged postoperative/post treatment appearance of an infrahilar mass of the left lower lobe (series 4, image 75). No pleural effusion or pneumothorax. Musculoskeletal: No chest wall abnormality. No acute osseous findings. CT ABDOMEN PELVIS FINDINGS Hepatobiliary: No solid liver abnormality is seen. Numerous tiny gallstones. No gallbladder wall thickening, or biliary dilatation. Pancreas: Unremarkable. No pancreatic ductal dilatation or surrounding inflammatory changes. Spleen: Normal in size without significant abnormality. Adrenals/Urinary Tract: Unchanged small benign adenoma of the medial limb of the right adrenal gland (series 2, image 60). Kidneys are normal, without renal calculi, solid lesion, or hydronephrosis. Bladder is unremarkable. Stomach/Bowel: Stomach is within normal limits. Appendix appears normal. No evidence of bowel wall thickening, distention, or inflammatory changes. Large burden of stool throughout the colon and rectum. Vascular/Lymphatic: Aortic atherosclerosis. No enlarged abdominal or pelvic lymph nodes. Reproductive: Mild prostatomegaly. Other: Small fat containing bilateral  inguinal hernias.  No ascites. Musculoskeletal: No acute osseous findings. Status post left hip intramedullary nail fixation. IMPRESSION: 1. Unchanged postoperative/post treatment appearance of an infrahilar mass of the left lower lobe. 2. Unchanged prominent pretracheal and subcarinal lymph nodes. 3. No evidence of lymphadenopathy or metastatic disease in the abdomen or pelvis. 4. Severe emphysema. 5. Cholelithiasis. 6. Cardiomegaly and coronary artery disease. Aortic Atherosclerosis (ICD10-I70.0) and Emphysema (ICD10-J43.9). Electronically Signed   By: Victor Jenny M.D.   On: 08/14/2023 07:45   ECHOCARDIOGRAM LIMITED Result Date: 07/23/2023    ECHOCARDIOGRAM LIMITED REPORT   Patient Name:   Victor Huber Date of Exam: 07/23/2023 Medical Rec #:  [id number]         Height:       73.0 in Accession #:    [id number]        Weight:       215.1 lb Date of Birth:  Sep 15, 1950  BSA:          2.219 m Patient Age:    72 years          BP:           100/60 mmHg Patient Gender: M                 HR:           83 bpm. Exam Location:  Outpatient Procedure: Limited Echo and Intracardiac Opacification Agent (Both Spectral and            Color Flow Doppler were utilized during procedure). Indications:    Atrial fibrillation  History:        Patient has prior history of Echocardiogram examinations, most                 recent 07/19/2023. Arrythmias:Atrial Fibrillation; Risk                 Factors:Diabetes, Dyslipidemia and Former Smoker. Adenocarcinoma                 of left lung.  Sonographer:    Gelene Kelly RDCS Referring Phys: [id number] [facility] [place]  Sonographer Comments: 30 minutes post definity  100/60 IMPRESSIONS  1. Left ventricular ejection fraction, by estimation, is 60 to 65%. The left ventricle has normal function. The left ventricle has no regional wall motion abnormalities. Left ventricular diastolic function could not be evaluated.  2. Right ventricular systolic function is moderately reduced. The  right ventricular size is normal. FINDINGS  Left Ventricle: Left ventricular ejection fraction, by estimation, is 60 to 65%. The left ventricle has normal function. The left ventricle has no regional wall motion abnormalities. Definity  contrast agent was given IV to delineate the left ventricular  endocardial borders. The left ventricular internal cavity size was normal in size. There is no left ventricular hypertrophy. Left ventricular diastolic function could not be evaluated. Right Ventricle: The right ventricular size is normal. No increase in right ventricular wall thickness. Right ventricular systolic function is moderately reduced.  Victor Huber Electronically signed by Victor Huber Signature Date/Time: 07/23/2023/2:38:39 PM    Final      ASSESSMENT AND PLAN: This is a 73 years old white male with metastatic non-small cell lung cancer, adenocarcinoma with no actionable mutations and PDL 1 expression of 5% that was initially diagnosed as stage IIIa non-small cell lung cancer, adenocarcinoma status post left lower lobectomy with lymph node dissection followed by a course of concurrent chemoradiation completed in January 2016.  The patient had evidence for disease metastasis in October 2018 with metastatic disease to the left femur as well as left supraclavicular and right paratracheal lymph nodes. The patient is currently on systemic chemotherapy initially was with carboplatin , Alimta  and Keytruda .  Carboplatin  was discontinued secondary to hypersensitivity reaction starting from cycle #2.   He was also treated with 3 cycles of maintenance Alimta  and Ketruda (pembrolizumab ) but Alimta  was discontinued secondary to intolerance. He underwent treatment with maintenance Keytruda  as a single agent status post 49 cycles.  The patient has been on observation since January 2022.  He is doing fine with no concerning complaints. He had repeat CT scan of the chest, abdomen and pelvis performed recently.  I  personally independently reviewed the scan and discussed the result with the patient and his wife.  His scan showed no concerning findings for disease progression.    Metastatic non-small cell lung cancer Metastatic non-small cell lung cancer, adenocarcinoma subtype,  diagnosed in October 2018. No actionable mutations and PD-L1 expression of 5%. Previously treated with carboplatin , Alimta , and Keytruda , followed by maintenance with Alimta  and Keytruda , and then single-agent Keytruda  for a total of 49 cycles. Last dose was in January 2022, and has been on observation since. Recent CT scan of the chest, abdomen, and pelvis shows stable disease with no new findings. Continues to do well without active treatment, likely due to the lasting effects of immunotherapy, which stimulates the immune system to continue fighting the cancer even after cessation of therapy. - Continue observation without active treatment. - Order CT scan of chest, abdomen, and pelvis before next visit in six months.  Emphysema Emphysema, well-managed. Managed by Dr. Baldwin Levee. No recent follow-up with Dr. Baldwin Levee, likely due to well-controlled condition.  Atrial fibrillation Atrial fibrillation, well-managed. Scheduled to visit the AFib clinic in one to two weeks.  Hernia Hernia present, no acute issues reported. He is considering management options.  Port-a-cath management Discussion regarding the port-a-cath. Options include removal or continued flushing every two months. He is undecided and considering options. He has had a port removed previously and is weighing the possibility of future need against the inconvenience of maintenance. - Discuss port-a-cath management options with him.    For the depression, he is currently on Zoloft and Wellbutrin  by his primary care physician and I recommended for him to discuss with Dr. Daneil Dunker adjustment of his medication. For the hypothyroidism and diabetes mellitus he is currently followed by  Dr. Daneil Dunker. The patient was advised to call immediately if he has any other concerning symptoms in the interval. The patient voices understanding of current disease status and treatment options and is in agreement with the current care plan. All questions were answered. The patient knows to call the clinic with any problems, questions or concerns. We can certainly see the patient much sooner if necessary.  Disclaimer: This note was dictated with voice recognition software. Similar sounding words can inadvertently be transcribed and may not be corrected upon review.

## 2023-08-21 ENCOUNTER — Telehealth: Payer: Self-pay | Admitting: Gastroenterology

## 2023-08-21 ENCOUNTER — Other Ambulatory Visit: Payer: Self-pay | Admitting: Internal Medicine

## 2023-08-21 ENCOUNTER — Ambulatory Visit: Payer: Self-pay | Admitting: Family Medicine

## 2023-08-21 DIAGNOSIS — C3492 Malignant neoplasm of unspecified part of left bronchus or lung: Secondary | ICD-10-CM

## 2023-08-21 NOTE — Progress Notes (Signed)
 Urine test is normal.  We can recheck in year.

## 2023-08-23 ENCOUNTER — Ambulatory Visit
Admission: RE | Admit: 2023-08-23 | Discharge: 2023-08-23 | Disposition: A | Discharge status: Home / Self Care | Source: Ambulatory Visit | Attending: Internal Medicine | Admitting: Internal Medicine

## 2023-08-23 DIAGNOSIS — Z85118 Personal history of other malignant neoplasm of bronchus and lung: Secondary | ICD-10-CM | POA: Diagnosis not present

## 2023-08-23 DIAGNOSIS — C3492 Malignant neoplasm of unspecified part of left bronchus or lung: Secondary | ICD-10-CM

## 2023-08-23 DIAGNOSIS — Z452 Encounter for adjustment and management of vascular access device: Secondary | ICD-10-CM | POA: Diagnosis not present

## 2023-08-23 HISTORY — PX: IR REMOVAL TUN ACCESS W/ PORT W/O FL MOD SED: IMG2290

## 2023-08-23 MED ORDER — LIDOCAINE-EPINEPHRINE 1 %-1:100000 IJ SOLN
20.0000 mL | Freq: Once | INTRAMUSCULAR | Status: AC
  Administered 2023-08-23: 20 mL via INTRADERMAL

## 2023-08-29 ENCOUNTER — Ambulatory Visit (HOSPITAL_COMMUNITY)
Admission: RE | Admit: 2023-08-29 | Discharge: 2023-08-29 | Disposition: A | Discharge status: Home / Self Care | Source: Ambulatory Visit | Attending: Internal Medicine | Admitting: Internal Medicine

## 2023-08-29 ENCOUNTER — Inpatient Hospital Stay (HOSPITAL_COMMUNITY)
Admission: RE | Admit: 2023-08-29 | Discharge: 2023-08-29 | Disposition: A | Discharge status: Home / Self Care | Source: Ambulatory Visit | Attending: Internal Medicine | Admitting: Internal Medicine

## 2023-08-29 ENCOUNTER — Encounter (HOSPITAL_COMMUNITY): Payer: Self-pay | Admitting: Internal Medicine

## 2023-08-29 VITALS — BP 110/72 | HR 98 | Ht 73.0 in | Wt 213.8 lb

## 2023-08-29 DIAGNOSIS — I482 Chronic atrial fibrillation, unspecified: Secondary | ICD-10-CM

## 2023-08-29 DIAGNOSIS — D6869 Other thrombophilia: Secondary | ICD-10-CM | POA: Diagnosis not present

## 2023-08-29 DIAGNOSIS — I4811 Longstanding persistent atrial fibrillation: Secondary | ICD-10-CM

## 2023-08-29 NOTE — Progress Notes (Signed)
 Primary Care Physician: Rodney Clamp, MD Referring Physician: Dr. Junette Older is a 73 y.o. male with a h/o COPD, previous long term smoker, hypothyroidism,  persistent afib since fall of 2017, small cell lung cancer treated in June 2016 He was initially  placed on flecainide but developed wide complex tachycardia, with subsequent cardiac arrest with out of hospital resuscitation. He was then placed on amiodarone  but was this was stopped due to treatment of lung cancer in 2016 and concerns of lung toxicity. He was then hospitalized in October 2017 for sotalol load with cardioversion which was unsuccessful. He has been in rate controlled afib since then. Ablation/and or tikosyn was discussed with him after failing sotalol but he was not ready for a procedure and thought med would be too expensive, so he opted to continue to live in rate controlled afib.   On follow up 08/29/23, he is currently in Afib. He notes primary symptoms as SOB and fatigue; history of cancer and emphysema may be contributory. Followed by oncology and currently under observation not on treatment. Seen by Dr. Theodis Fiscal on 3/27 and noted to have elevated HR despite being on digoxin . Sent to Afib clinic to consider as possible candidate for ablation. Review of records do not show any ECG in the [facility] system that is in sinus rhythm; all ECGs show Afib going back to 2018. No bleeding issues on Eliquis  5 mg BID.   Today, he denies symptoms of palpitations, chest pain, orthopnea, PND, lower extremity edema, dizziness, presyncope, syncope, or neurologic sequela. The patient is tolerating medications without difficulties and is otherwise without complaint today.   Past Medical History:  Diagnosis Date   Adenocarcinoma of left lung, stage 3 (HCC) 08/15/2016   Allergy    Anxiety    Arthritis    Atrial fibrillation (HCC) 08/15/2016   Atrial fibrillation (HCC)    Cancer, metastatic to bone [facility])    lung   Cataract     Waiting to schedule bilateral cataract surgery   CHF (congestive heart failure) (HCC) 01/04/2022   COPD (chronic obstructive pulmonary disease) (HCC) 08/15/2016   Depression    Diabetes mellitus, type 2 (HCC)    Dysphagia    Dysrhythmia    a-fib   GERD (gastroesophageal reflux disease)    Heart murmur    atenlol for A Fib/Eliquis    History of chemotherapy    History of radiation therapy    Hyperlipidemia    Hypertension    Hypothyroid 08/15/2016   Longstanding persistent atrial fibrillation (HCC) 08/29/2016   Pathologic fracture    left femur   Pneumonitis    S/P TURP 08/15/2016   T2DM (type 2 diabetes mellitus) (HCC) 05/03/2022   Wears glasses    Wears hearing aid in both ears    Past Surgical History:  Procedure Laterality Date   BIOPSY  12/21/2019   Procedure: BIOPSY;  Surgeon: Normie Becton., MD;  Location: [facility] ENDOSCOPY;  Service: Gastroenterology;;   BIOPSY  05/23/2020   Procedure: BIOPSY;  Surgeon: Normie Becton., MD;  Location: [facility] ENDOSCOPY;  Service: Gastroenterology;;   BRONCHOSCOPY  10/2014   CARDIAC CATHETERIZATION     05/07/12   CARDIOVERSION     x2   COLONOSCOPY     several yrs   DG BIOPSY LUNG Left 10/2014   FNA - Adenocarcinoma    ESOPHAGOGASTRODUODENOSCOPY (EGD) WITH PROPOFOL  N/A 12/21/2019   Procedure: ESOPHAGOGASTRODUODENOSCOPY (EGD) WITH PROPOFOL ;  Surgeon: Normie Becton., MD;  Location:  [facility] ENDOSCOPY;  Service: Gastroenterology;  Laterality: N/A;   ESOPHAGOGASTRODUODENOSCOPY (EGD) WITH PROPOFOL  N/A 05/23/2020   Procedure: ESOPHAGOGASTRODUODENOSCOPY (EGD) WITH PROPOFOL ;  Surgeon: Brice Campi Albino Alu., MD;  Location: [facility] ENDOSCOPY;  Service: Gastroenterology;  Laterality: N/A;   ESOPHAGOGASTRODUODENOSCOPY (EGD) WITH PROPOFOL  N/A 06/30/2020   Procedure: ESOPHAGOGASTRODUODENOSCOPY (EGD) WITH PROPOFOL ;  Surgeon: Brice Campi Albino Alu., MD;  Location: [facility] ENDOSCOPY;  Service: Gastroenterology;  Laterality: N/A;  ultra slim scope  avail   EUS N/A 12/21/2019   Procedure: UPPER ENDOSCOPIC ULTRASOUND (EUS) RADIAL;  Surgeon: Normie Becton., MD;  Location: [facility] ENDOSCOPY;  Service: Gastroenterology;  Laterality: N/A;   FEMUR IM NAIL Left 02/19/2017   FEMUR IM NAIL Left 02/19/2017   Procedure: INTRAMEDULLARY (IM) NAIL FEMORAL;  Surgeon: Osa Blase, MD;  Location: [facility] OR;  Service: Orthopedics;  Laterality: Left;   IR FLUORO GUIDE PORT INSERTION RIGHT  07/03/2017   IR REMOVAL TUN ACCESS W/ PORT W/O FL MOD SED  08/23/2023   IR US  GUIDE VASC ACCESS RIGHT  07/03/2017   LUNG CANCER SURGERY Left 12/2014   Wedge Resection    MULTIPLE TOOTH EXTRACTIONS     SAVORY DILATION N/A 05/23/2020   Procedure: SAVORY DILATION;  Surgeon: Normie Becton., MD;  Location: [facility] ENDOSCOPY;  Service: Gastroenterology;  Laterality: N/A;   SAVORY DILATION N/A 06/30/2020   Procedure: SAVORY DILATION;  Surgeon: Brice Campi Albino Alu., MD;  Location: [facility] ENDOSCOPY;  Service: Gastroenterology;  Laterality: N/A;   Status post TURP     TONSILLECTOMY     UPPER GASTROINTESTINAL ENDOSCOPY      Current Outpatient Medications  Medication Sig Dispense Refill   acetaminophen  (TYLENOL ) 500 MG tablet Take 500 mg by mouth every 8 (eight) hours as needed for moderate pain.     atorvastatin  (LIPITOR) 40 MG tablet TAKE 1 TABLET EVERY DAY 90 tablet 3   bisoprolol  (ZEBETA ) 5 MG tablet Take 1 tablet (5 mg total) by mouth daily. 90 tablet 3   buPROPion  (WELLBUTRIN  XL) 150 MG 24 hr tablet Take 1 tablet (150 mg total) by mouth daily. 90 tablet 3   diazepam  (VALIUM ) 5 MG tablet TAKE 1 TABLET EVERY 12 HOURS AS NEEDED FOR ANXIETY 30 tablet 5   digoxin  (LANOXIN ) 0.125 MG tablet TAKE 1 TABLET EVERY DAY 90 tablet 3   doxycycline  (VIBRA -TABS) 100 MG tablet Take 1 tablet (100 mg total) by mouth 2 (two) times daily. 14 tablet 0   ELIQUIS  5 MG TABS tablet TAKE 1 TABLET TWICE DAILY 180 tablet 3   Empagliflozin-metFORMIN  HCl ER (SYNJARDY  XR) 12.07-998 MG TB24 Take 2  tablets by mouth daily. 90 tablet 3   esomeprazole  (NEXIUM ) 40 MG capsule TAKE 1 CAPSULE TWICE DAILY BEFORE A MEAL 180 capsule 3   gabapentin  (NEURONTIN ) 300 MG capsule TAKE 2 CAPSULES AT BEDTIME AS NEEDED 180 capsule 3   levothyroxine  (SYNTHROID ) 175 MCG tablet TAKE 1 TABLET EVERY DAY 90 tablet 3   lipase/protease/amylase (CREON ) 36000 UNITS CPEP capsule Take 4 capsules po during each meal. Take 1-2 capsules po during each snack.( up to 2 snacks daily). 400 capsule 3   mupirocin  ointment (BACTROBAN ) 2 % Apply 1 Application topically 2 (two) times daily. 22 g 0   polyethylene glycol (MIRALAX  / GLYCOLAX ) 17 g packet Take 17 g by mouth daily as needed. 14 each 0   sertraline (ZOLOFT) 25 MG tablet Take 25 mg by mouth daily.     STIOLTO RESPIMAT  2.5-2.5 MCG/ACT AERS INHALE 2 PUFFS INTO THE LUNGS DAILY. 12 g 3  sucralfate  (CARAFATE ) 1 g tablet TAKE 1 TABLET FOUR TIMES DAILY WITH MEALS AND AT BEDTIME 360 tablet 3   tamsulosin  (FLOMAX ) 0.4 MG CAPS capsule TAKE 1 CAPSULE EVERY DAY 90 capsule 3   No current facility-administered medications for this encounter.    Allergies  Allergen Reactions   Carboplatin  Itching, Nausea And Vomiting and Other (See Comments)    Flushing   Flecainide Hypertension    CAUSED HEART ISSUES    Debrox [Carbamide Peroxide]     Swelling in ear canal.    ROS- All systems are reviewed and negative except as per the HPI above  Physical Exam: Vitals:   08/29/23 1400  BP: 110/72  Pulse: 98  Weight: 97 kg  Height: 6\' 1"  (1.854 m)    Wt Readings from Last 3 Encounters:  08/29/23 97 kg  08/20/23 97.3 kg  08/19/23 96.4 kg    Labs: Lab Results  Component Value Date   NA 138 08/12/2023   K 4.3 08/12/2023   CL 104 08/12/2023   CO2 29 08/12/2023   GLUCOSE 141 (H) 08/12/2023   BUN 22 08/12/2023   CREATININE 0.95 08/12/2023   CALCIUM  9.0 08/12/2023   MG 1.9 01/13/2023   Lab Results  Component Value Date   INR 1.3 (H) 01/10/2023   Lab Results  Component  Value Date   CHOL 115 07/18/2023   HDL 36 (L) 07/18/2023   LDLCALC 48 07/18/2023   TRIG 187 (H) 07/18/2023   GEN- The patient is well appearing, alert and oriented x 3 today.   Neck - no JVD or carotid bruit noted Lungs- Clear to ausculation bilaterally with decreased breath sounds R lower lobe Heart- Irregular rate and rhythm, no murmurs, rubs or gallops, PMI not laterally displaced Extremities- no clubbing, cyanosis, or edema Skin - no rash or ecchymosis noted   EKG- Vent. rate 98 BPM PR interval * ms QRS duration 110 ms QT/QTcB 364/464 ms P-R-T axes * 108 -29 Atrial fibrillation Rightward axis ST & T wave abnormality, consider inferior ischemia ST & T wave abnormality, consider anterolateral ischemia Prolonged QT Abnormal ECG When compared with ECG of 27-Jun-2023 15:54, Right bundle branch block is no longer Present  Epic records reviewed   Assessment and Plan: 1. Longstanding persistent afib since fall 2017 Was initially on Flecainide with proarrythmia effect with wide complex tachycardia, cardiac arrest s/p defibrillation(2014), then treated with amiodarone  and stopped(2016 due to concerns for lung toxicity), then hospitalized for sotalol/cardioversion which failed to convert pt  Ablation or Tikosyn were offered in 2018 and pt did not want to pursue either at that time.   He is currently in Afib. Discussion on limited options given it appears he has been in Afib dating back to 2018. I do not have a Qtc in NSR to gauge whether patient is a good candidate for Tikosyn. Previous history alludes to discontinuation of amiodarone  secondary to concerns for lung toxicity. He does not appear to be a candidate for AAD therapy or ablation at this point in time. I discussed patient's case with Dr. Carolynne Citron for further recommendations. After discussion, will place a 1 week Zio monitor to assess patient's HR on bisoprolol  and digoxin . This information can help determine if patient may  benefit from an AV nodal ablation / PPM. I will arrange patient to establish care with Dr. Carolynne Citron and discuss monitor results / long term treatment plan.   2. S/p lung CA(2016) Per oncology    Will help establish care with Dr.  Carolynne Citron.   Woody Heading, PA-C Afib Clinic [facility] [address] [place], [place] [address] [phone]

## 2023-09-11 DIAGNOSIS — I482 Chronic atrial fibrillation, unspecified: Secondary | ICD-10-CM | POA: Diagnosis not present

## 2023-09-13 ENCOUNTER — Ambulatory Visit (HOSPITAL_COMMUNITY): Payer: Self-pay | Admitting: Internal Medicine

## 2023-10-21 ENCOUNTER — Encounter: Payer: Self-pay | Admitting: Internal Medicine

## 2023-10-21 ENCOUNTER — Ambulatory Visit: Attending: Internal Medicine | Admitting: Internal Medicine

## 2023-10-21 VITALS — BP 82/50 | HR 84 | Ht 73.0 in | Wt 215.6 lb

## 2023-10-21 DIAGNOSIS — I4821 Permanent atrial fibrillation: Secondary | ICD-10-CM

## 2023-10-21 NOTE — Progress Notes (Signed)
 HPI [name] is referred for evaluation of atrial fib. He is a pleasant 73 yo man with a h/o atrial fib dating back over 10 years. He has had multiple DCCV  but none in the last 8 years. He was diagnosed with lung CA and has mets to the leg with a stress fracture. He has been treated with chemo/XRT and most recently Keytruda . He states that his Small cell CA is not gone but inactive. He was diagnosed over 8 years ago. The patient has minimal palpitations. He wore a zio monitor which demonstrated afib with low rates in the low 40's and high rates up to 180/min. His ave was 85/min. No prolonged pauses. He notes that at rest he feels ok but when he gets up, he gets sob with much acitivity. He has a long h/o tobacco abuse and and partial lobectomy. His DLCO was 40% on PFT. He has not required oxygen.  Allergies  Allergen Reactions   Carboplatin  Itching, Nausea And Vomiting and Other (See Comments)    Flushing   Flecainide Hypertension    CAUSED HEART ISSUES    Debrox [Carbamide Peroxide]     Swelling in ear canal.      Current Outpatient Medications  Medication Sig Dispense Refill   acetaminophen  (TYLENOL ) 500 MG tablet Take 500 mg by mouth every 8 (eight) hours as needed for moderate pain.     atorvastatin  (LIPITOR) 40 MG tablet TAKE 1 TABLET EVERY DAY 90 tablet 3   bisoprolol  (ZEBETA ) 5 MG tablet Take 1 tablet (5 mg total) by mouth daily. 90 tablet 3   diazepam  (VALIUM ) 5 MG tablet TAKE 1 TABLET EVERY 12 HOURS AS NEEDED FOR ANXIETY 30 tablet 5   digoxin  (LANOXIN ) 0.125 MG tablet TAKE 1 TABLET EVERY DAY 90 tablet 3   ELIQUIS  5 MG TABS tablet TAKE 1 TABLET TWICE DAILY 180 tablet 3   Empagliflozin-metFORMIN  HCl ER (SYNJARDY  XR) 12.07-998 MG TB24 Take 2 tablets by mouth daily. 90 tablet 3   esomeprazole  (NEXIUM ) 40 MG capsule TAKE 1 CAPSULE TWICE DAILY BEFORE A MEAL 180 capsule 3   gabapentin  (NEURONTIN ) 300 MG capsule TAKE 2 CAPSULES AT BEDTIME AS NEEDED 180 capsule 3   levothyroxine   (SYNTHROID ) 175 MCG tablet TAKE 1 TABLET EVERY DAY 90 tablet 3   lipase/protease/amylase (CREON ) 36000 UNITS CPEP capsule Take 4 capsules po during each meal. Take 1-2 capsules po during each snack.( up to 2 snacks daily). 400 capsule 3   mupirocin  ointment (BACTROBAN ) 2 % Apply 1 Application topically 2 (two) times daily. 22 g 0   polyethylene glycol (MIRALAX  / GLYCOLAX ) 17 g packet Take 17 g by mouth daily as needed. 14 each 0   sertraline (ZOLOFT) 25 MG tablet Take 25 mg by mouth daily.     STIOLTO RESPIMAT  2.5-2.5 MCG/ACT AERS INHALE 2 PUFFS INTO THE LUNGS DAILY. 12 g 3   sucralfate  (CARAFATE ) 1 g tablet TAKE 1 TABLET FOUR TIMES DAILY WITH MEALS AND AT BEDTIME 360 tablet 3   tamsulosin  (FLOMAX ) 0.4 MG CAPS capsule TAKE 1 CAPSULE EVERY DAY 90 capsule 3   No current facility-administered medications for this visit.     Past Medical History:  Diagnosis Date   Adenocarcinoma of left lung, stage 3 (HCC) 08/15/2016   Allergy    Anxiety    Arthritis    Atrial fibrillation (HCC) 08/15/2016   Atrial fibrillation (HCC)    Cancer, metastatic to bone (HCC)    lung  Cataract    Waiting to schedule bilateral cataract surgery   CHF (congestive heart failure) (HCC) 01/04/2022   COPD (chronic obstructive pulmonary disease) (HCC) 08/15/2016   Depression    Diabetes mellitus, type 2 (HCC)    Dysphagia    Dysrhythmia    a-fib   GERD (gastroesophageal reflux disease)    Heart murmur    atenlol for A Fib/Eliquis    History of chemotherapy    History of radiation therapy    Hyperlipidemia    Hypertension    Hypothyroid 08/15/2016   Longstanding persistent atrial fibrillation (HCC) 08/29/2016   Pathologic fracture    left femur   Pneumonitis    S/P TURP 08/15/2016   T2DM (type 2 diabetes mellitus) (HCC) 05/03/2022   Wears glasses    Wears hearing aid in both ears     ROS:   All systems reviewed and negative except as noted in the HPI.   Past Surgical History:  Procedure  Laterality Date   BIOPSY  12/21/2019   Procedure: BIOPSY;  Surgeon: Wilhelmenia Aloha Raddle., MD;  Location: [facility] ENDOSCOPY;  Service: Gastroenterology;;   BIOPSY  05/23/2020   Procedure: BIOPSY;  Surgeon: Wilhelmenia Aloha Raddle., MD;  Location: [facility] ENDOSCOPY;  Service: Gastroenterology;;   BRONCHOSCOPY  10/2014   CARDIAC CATHETERIZATION     05/07/12   CARDIOVERSION     x2   COLONOSCOPY     several yrs   DG BIOPSY LUNG Left 10/2014   FNA - Adenocarcinoma    ESOPHAGOGASTRODUODENOSCOPY (EGD) WITH PROPOFOL  N/A 12/21/2019   Procedure: ESOPHAGOGASTRODUODENOSCOPY (EGD) WITH PROPOFOL ;  Surgeon: Wilhelmenia Aloha Raddle., MD;  Location: [facility] ENDOSCOPY;  Service: Gastroenterology;  Laterality: N/A;   ESOPHAGOGASTRODUODENOSCOPY (EGD) WITH PROPOFOL  N/A 05/23/2020   Procedure: ESOPHAGOGASTRODUODENOSCOPY (EGD) WITH PROPOFOL ;  Surgeon: Wilhelmenia Aloha Raddle., MD;  Location: [facility] ENDOSCOPY;  Service: Gastroenterology;  Laterality: N/A;   ESOPHAGOGASTRODUODENOSCOPY (EGD) WITH PROPOFOL  N/A 06/30/2020   Procedure: ESOPHAGOGASTRODUODENOSCOPY (EGD) WITH PROPOFOL ;  Surgeon: Wilhelmenia Aloha Raddle., MD;  Location: [facility] ENDOSCOPY;  Service: Gastroenterology;  Laterality: N/A;  ultra slim scope avail   EUS N/A 12/21/2019   Procedure: UPPER ENDOSCOPIC ULTRASOUND (EUS) RADIAL;  Surgeon: Wilhelmenia Aloha Raddle., MD;  Location: [facility] ENDOSCOPY;  Service: Gastroenterology;  Laterality: N/A;   FEMUR IM NAIL Left 02/19/2017   FEMUR IM NAIL Left 02/19/2017   Procedure: INTRAMEDULLARY (IM) NAIL FEMORAL;  Surgeon: Josefina Chew, MD;  Location: [facility] OR;  Service: Orthopedics;  Laterality: Left;   IR FLUORO GUIDE PORT INSERTION RIGHT  07/03/2017   IR REMOVAL TUN ACCESS W/ PORT W/O FL MOD SED  08/23/2023   IR US  GUIDE VASC ACCESS RIGHT  07/03/2017   LUNG CANCER SURGERY Left 12/2014   Wedge Resection    MULTIPLE TOOTH EXTRACTIONS     SAVORY DILATION N/A 05/23/2020   Procedure: SAVORY DILATION;  Surgeon: Wilhelmenia Aloha Raddle., MD;   Location: [facility] ENDOSCOPY;  Service: Gastroenterology;  Laterality: N/A;   SAVORY DILATION N/A 06/30/2020   Procedure: SAVORY DILATION;  Surgeon: Wilhelmenia Aloha Raddle., MD;  Location: [facility] ENDOSCOPY;  Service: Gastroenterology;  Laterality: N/A;   Status post TURP     TONSILLECTOMY     UPPER GASTROINTESTINAL ENDOSCOPY       Family History  Problem Relation Age of Onset   Breast cancer Mother    Breast cancer Maternal Aunt    Breast cancer Maternal Aunt    Lung disease Neg Hx    Colon cancer Neg Hx    Stomach cancer Neg Hx  Pancreatic cancer Neg Hx    Rectal cancer Neg Hx    Colon polyps Neg Hx    Esophageal cancer Neg Hx      Social History   Socioeconomic History   Marital status: Married    Spouse name: Not on file   Number of children: Not on file   Years of education: Not on file   Highest education level: 12th grade  Occupational History    Comment: retired   Tobacco Use   Smoking status: Former    Current packs/day: 0.00    Average packs/day: 1 pack/day for 50.0 years (50.0 ttl pk-yrs)    Types: Cigarettes    Start date: 08/16/1962    Quit date: 08/15/2012    Years since quitting: 11.1   Smokeless tobacco: Never   Tobacco comments:    Former smoker 08/29/23  Vaping Use   Vaping status: Never Used  Substance and Sexual Activity   Alcohol use: No   Drug use: No   Sexual activity: Not on file  Other Topics Concern   Not on file  Social History Narrative   [facility] (09/26/16):   Originally from [place] . Moved to [facility] February 2018. Always lived in [place]. Moved to be closer to children & grandchildren. No international travel. 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 No mold exposure. No bird exposure or hot tub exposure. Enjoys reading. Previously enjoyed wood working with domestic woods.    Social Drivers of Corporate investment banker Strain: Low Risk  (02/11/2023)   Overall  Financial Resource Strain (CARDIA)    Difficulty of Paying Living Expenses: Not very hard  Food Insecurity: No Food Insecurity (02/11/2023)   Hunger Vital Sign    Worried About Running Out of Food in the Last Year: Never true    Ran Out of Food in the Last Year: Never true  Transportation Needs: No Transportation Needs (02/11/2023)   PRAPARE - Administrator, Civil Service (Medical): No    Lack of Transportation (Non-Medical): No  Physical Activity: Unknown (02/11/2023)   Exercise Vital Sign    Days of Exercise per Week: 0 days    Minutes of Exercise per Session: Not on file  Stress: Stress Concern Present (02/11/2023)   [employer] of Occupational Health - Occupational Stress Questionnaire    Feeling of Stress : To some extent  Social Connections: Moderately Isolated (02/11/2023)   Social Connection and Isolation Panel    Frequency of Communication with Friends and Family: Twice a week    Frequency of Social Gatherings with Friends and Family: Once a week    Attends Religious Services: Never    Database administrator or Organizations: No    Attends Engineer, structural: Not on file    Marital Status: Married  Catering manager Violence: Not At Risk (01/10/2023)   Humiliation, Afraid, Rape, and Kick questionnaire    Fear of Current or Ex-Partner: No    Emotionally Abused: No    Physically Abused: No    Sexually Abused: No     BP (!) 82/50   Pulse 84   Ht 6' 1 (1.854 m)   Wt 215 lb 9.6 oz (97.8 kg)   SpO2 95%   BMI 28.44 kg/m  BP 105/80 by me. Physical Exam:  Well appearing NAD HEENT: Unremarkable Neck:  No JVD, no thyromegally Lymphatics:  No adenopathy Back:  No CVA tenderness Lungs:  Clear with no wheezes HEART:  IRegular rate rhythm, no murmurs, no rubs, no clicks Abd:  soft, positive bowel sounds, no organomegally, no rebound, no guarding Ext:  2 plus pulses, no edema, no cyanosis, no clubbing Skin:  No rashes no nodules Neuro:  CN  II through XII intact, motor grossly intact   DEVICE  Normal device function.  See PaceArt for details.   Assess/Plan: Perm atrial fib - his VR is not great and I discussed AV node ablation. However with his multiple comorbidities, I think that the risk/benefits favors not pursuing AV node RFA/PPM insertion. I explained this to the patient. His VR is fast with activity and I asked him to increased the zebeta  to bid. Continue lanoxin .  Victor Travontae Freiberger,MD

## 2023-10-21 NOTE — Patient Instructions (Signed)
 Medication Instructions:  Your physician has recommended you make the following change in your medication:  Increase zebeta  to 2 tablets daily.  If this helps continue taking. If it doesn't not, decrease back to 1 tablet daily.  Lab Work: None ordered.  You may go to any [facility] Location for your lab work:  [facility] - [address] ([facility]) - [address] [phone] [address]  [place] - [address]   [place]  - [address]   [place] - [address] - [address] [employer]  - [address] [place] - [address] ([facility]   If you have labs (blood work) drawn today and your tests are completely normal, you will receive your results only by: [employer] (if you have MyChart)  If you have any lab test that is abnormal or we need to change your treatment, we will call you or send a MyChart message to review the results.  Testing/Procedures: None ordered.  Follow-Up: At [facility], you and your health needs are our priority.  As part of our continuing mission to provide you with exceptional heart care, we have created designated Provider Care Teams.  These Care Teams include your primary Cardiologist (physician) and Advanced Practice Providers (APPs -  Physician Assistants and Nurse Practitioners) who all work together to provide you with the care you need, when you need it.  Your next appointment:   1 year(s)  The format for your next appointment:   In Person  Provider:   Donnice Primus, MD or one of the following Advanced Practice Providers on your designated Care Team:   Charlies Arthur, [place] Ozell Jodie Passey, [place] Leotis Barrack, NP  Note: Remote monitoring is used to monitor your Pacemaker/ ICD from home. This monitoring reduces the number of office visits required to check your device to one time per year. It allows us  to keep an  eye on the functioning of your device to ensure it is working properly.

## 2023-10-24 ENCOUNTER — Other Ambulatory Visit: Payer: Self-pay | Admitting: Family Medicine

## 2023-10-24 DIAGNOSIS — E785 Hyperlipidemia, unspecified: Secondary | ICD-10-CM

## 2023-11-04 ENCOUNTER — Encounter: Payer: Self-pay | Admitting: Internal Medicine

## 2023-11-19 ENCOUNTER — Ambulatory Visit: Admitting: Family Medicine

## 2023-11-19 ENCOUNTER — Encounter: Payer: Self-pay | Admitting: Family Medicine

## 2023-11-19 VITALS — BP 97/53 | HR 73 | Temp 97.9°F | Ht 73.0 in | Wt 217.6 lb

## 2023-11-19 DIAGNOSIS — E1169 Type 2 diabetes mellitus with other specified complication: Secondary | ICD-10-CM | POA: Diagnosis not present

## 2023-11-19 DIAGNOSIS — B009 Herpesviral infection, unspecified: Secondary | ICD-10-CM | POA: Diagnosis not present

## 2023-11-19 DIAGNOSIS — F321 Major depressive disorder, single episode, moderate: Secondary | ICD-10-CM

## 2023-11-19 DIAGNOSIS — Z7984 Long term (current) use of oral hypoglycemic drugs: Secondary | ICD-10-CM | POA: Diagnosis not present

## 2023-11-19 LAB — POCT GLYCOSYLATED HEMOGLOBIN (HGB A1C): Hemoglobin A1C: 8.1 % — AB (ref 4.0–5.6)

## 2023-11-19 MED ORDER — VALACYCLOVIR HCL 1 G PO TABS: 1000.0000 mg | ORAL_TABLET | Freq: Every day | ORAL | 0 refills | Status: AC

## 2023-11-19 NOTE — Assessment & Plan Note (Signed)
 Lesion on left buttocks consistent with HSV infection.  He has known history of this.  Will start Valtrex .

## 2023-11-19 NOTE — Patient Instructions (Signed)
 It was very nice to see you today!  VISIT SUMMARY: Today, we reviewed your A1c levels for diabetes management, addressed a herpes sore on your left buttock, and discussed your current treatment for depression.  YOUR PLAN: TYPE 2 DIABETES MELLITUS: Your A1c level is 8.1, which ok but still higher than desired. -Continue taking Synjardy  twice daily. -Make dietary changes to reduce sugar and carbohydrate intake. -Re-evaluate A1c in three months. -Consider adding injectable medication if A1c increases.  HERPES SIMPLEX INFECTION, LEFT BUTTOCK: You have a recurrent herpes simplex infection with a current flare-up. -Start taking Valtrex , 1 pill daily for 5 days. -You have a total of 30 pills for future use. -Valtrex  is more effective than topical treatments and should help resolve symptoms quickly.  MAJOR DEPRESSIVE DISORDER: Your mood is stable on sertraline 25 mg after discontinuing bupropion  due to tremors. -Continue taking sertraline 25 mg daily. -Monitor your mood stability without bupropion .  Return in about 3 months (around 02/19/2024) for Follow Up.   Take care, Dr Kennyth  PLEASE NOTE:  If you had any lab tests, please let us  know if you have not heard back within a few days. You may see your results on mychart before we have a chance to review them but we will give you a call once they are reviewed by us .   If we ordered any referrals today, please let us  know if you have not heard from their office within the next week.   If you had any urgent prescriptions sent in today, please check with the pharmacy within an hour of our visit to make sure the prescription was transmitted appropriately.   Please try these tips to maintain a healthy lifestyle:  Eat at least 3 REAL meals and 1-2 snacks per day.  Aim for no more than 5 hours between eating.  If you eat breakfast, please do so within one hour of getting up.   Each meal should contain half fruits/vegetables, one quarter  protein, and one quarter carbs (no bigger than a computer mouse)  Cut down on sweet beverages. This includes juice, soda, and sweet tea.   Drink at least 1 glass of water  with each meal and aim for at least 8 glasses per day  Exercise at least 150 minutes every week.

## 2023-11-19 NOTE — Progress Notes (Signed)
   Victor Huber is a 73 y.o. male who presents today for an office visit.  Assessment/Plan:  Chronic Problems Addressed Today: T2DM (type 2 diabetes mellitus) (HCC) A1c up slightly at 8.1.  Doing well on Synjardy  12.07-998 twice daily.  We discussed lifestyle interventions.  We did discuss adding on additional medications however we will hold off on this for now.  He will continue to work on cutting down the sugar.  Recheck A1c in 3 months.  If not at goal at that time would consider starting GLP-1 agonist.  HSV infection Lesion on left buttocks consistent with HSV infection.  He has known history of this.  Will start Valtrex .    Depression, major, single episode, moderate (HCC) Overall mood is very well-controlled.  We discontinue his Wellbutrin  3 months ago due to concern for side effects including tremor.  Done well off of this.  He is on Zoloft 25 mg daily and doing well.     Subjective:  HPI:  See assessment / plan for status of chronic conditions.   Discussed the use of AI scribe software for clinical note transcription with the patient, who gave verbal consent to proceed.  History of Present Illness Victor Huber is a 73 year old male who presents for an A1c follow-up and evaluation of herpes sores on the left buttock.  He has not been regularly checking his blood sugar levels but is attempting to watch his sugar intake and reduce sweets and carbs. He is currently taking Synjardy  twice per day.  He has sores on his left buttock, diagnosed as herpes many years ago, possibly decades. He does not recall the specific treatment he received in the past but mentions it was diagnosed during a hospital stay. He recalls being given a cream in the hospital but does not remember the name. He noticed the current sore today and it is not yet tender or painful but anticipates it will become so.  He has stopped taking bupropion  due to experiencing significant tremors, which he  identified as a side effect. He is currently taking sertraline 25 mg and mentions a past prescription of duloxetine , which he believes was stopped last year. He feels his mood has been stable since discontinuing bupropion .  He reports difficulty with physical activity due to hot and humid weather, which affects his breathing and lung function. He mentions that the weather has been unusually wet and humid this summer, impacting his ability to exercise outdoors.         Objective:  Physical Exam: BP (!) 97/53   Pulse 73   Temp 97.9 F (36.6 C) (Temporal)   Ht 6' 1 (1.854 m)   Wt 217 lb 9.6 oz (98.7 kg)   SpO2 93%   BMI 28.71 kg/m   Gen: No acute distress, resting comfortably CV: Regular rate and rhythm with no murmurs appreciated Pulm: Normal work of breathing, clear to auscultation bilaterally with no crackles, wheezes, or rhonchi Skin: Vesicular appearing rash with erythematous base on left buttocks Neuro: Grossly normal, moves all extremities Psych: Normal affect and thought content      Victor Huber M. Kennyth, MD 11/19/2023 2:19 PM

## 2023-11-19 NOTE — Assessment & Plan Note (Signed)
 Overall mood is very well-controlled.  We discontinue his Wellbutrin  3 months ago due to concern for side effects including tremor.  Done well off of this.  He is on Zoloft 25 mg daily and doing well.

## 2023-11-19 NOTE — Assessment & Plan Note (Signed)
 A1c up slightly at 8.1.  Doing well on Synjardy  12.07-998 twice daily.  We discussed lifestyle interventions.  We did discuss adding on additional medications however we will hold off on this for now.  He will continue to work on cutting down the sugar.  Recheck A1c in 3 months.  If not at goal at that time would consider starting GLP-1 agonist.

## 2023-11-25 DIAGNOSIS — F411 Generalized anxiety disorder: Secondary | ICD-10-CM | POA: Diagnosis not present

## 2023-11-25 DIAGNOSIS — F0631 Mood disorder due to known physiological condition with depressive features: Secondary | ICD-10-CM | POA: Diagnosis not present

## 2023-12-02 ENCOUNTER — Other Ambulatory Visit (HOSPITAL_COMMUNITY): Payer: Self-pay | Admitting: Cardiovascular Disease

## 2023-12-02 DIAGNOSIS — I482 Chronic atrial fibrillation, unspecified: Secondary | ICD-10-CM

## 2023-12-03 NOTE — Telephone Encounter (Signed)
 Eliquis  5mg  refill request received. Patient is 73 years old, weight-98.7kg, Crea-0.95 on 08/12/23, Diagnosis-Afib, and last seen by Dr. Waddell on 10/21/23. Dose is appropriate based on dosing criteria. Will send in refill to requested pharmacy.

## 2023-12-10 ENCOUNTER — Encounter: Payer: Self-pay | Admitting: Family Medicine

## 2023-12-10 ENCOUNTER — Encounter: Payer: Self-pay | Admitting: Emergency Medicine

## 2023-12-11 ENCOUNTER — Other Ambulatory Visit: Payer: Self-pay | Admitting: *Deleted

## 2023-12-11 MED ORDER — SYNJARDY XR 12.5-1000 MG PO TB24: 2.0000 | ORAL_TABLET | Freq: Every day | ORAL | 3 refills | Status: DC

## 2023-12-12 ENCOUNTER — Ambulatory Visit: Admitting: Emergency Medicine

## 2023-12-12 ENCOUNTER — Other Ambulatory Visit: Payer: Self-pay | Admitting: Family Medicine

## 2023-12-12 DIAGNOSIS — J441 Chronic obstructive pulmonary disease with (acute) exacerbation: Secondary | ICD-10-CM

## 2023-12-12 MED ORDER — SYNJARDY XR 12.5-1000 MG PO TB24: 2.0000 | ORAL_TABLET | Freq: Every day | ORAL | 3 refills | Status: AC

## 2023-12-12 NOTE — Telephone Encounter (Signed)
 Copied from CRM #[id number]. Topic: Clinical - Medication Refill >> Dec 12, 2023  1:15 PM Sasha M wrote: Medication: Empagliflozin-metFORMIN  HCl ER (SYNJARDY  XR) 12.07-998 MG TB24  Has the patient contacted their pharmacy? Yes (Agent: If no, request that the patient contact the pharmacy for the refill. If patient does not wish to contact the pharmacy document the reason why and proceed with request.) (Agent: If yes, when and what did the pharmacy advise?) No refills  This is the patient's preferred pharmacy:  [facility] - [place], [place] - [address] [place] [address] Phone: [phone] Fax: [phone]   Is this the correct pharmacy for this prescription? Yes If no, delete pharmacy and type the correct one.   Has the prescription been filled recently? No  Is the patient out of the medication? No  Has the patient been seen for an appointment in the last year OR does the patient have an upcoming appointment? Yes  Can we respond through MyChart? Yes  Agent: Please be advised that Rx refills may take up to 3 business days. We ask that you follow-up with your pharmacy.

## 2023-12-13 ENCOUNTER — Other Ambulatory Visit: Payer: Self-pay

## 2023-12-13 ENCOUNTER — Telehealth: Payer: Self-pay | Admitting: Internal Medicine

## 2023-12-13 MED ORDER — BISOPROLOL FUMARATE 10 MG PO TABS: 10.0000 mg | ORAL_TABLET | Freq: Every day | ORAL | 3 refills | Status: AC

## 2023-12-13 NOTE — Telephone Encounter (Signed)
 Pt c/o medication issue:  1. Name of Medication: bisoprolol  (ZEBETA ) 5 MG tablet   2. How are you currently taking this medication (dosage and times per day)? Take 1 tablet (5 mg total) by mouth daily.   3. Are you having a reaction (difficulty breathing--STAT)? No  4. What is your medication issue? Pt discussed changing prescription dosage to 10 mg at last appt with Dr. Waddell and would like a new prescription request sent in to [facility] for 90 days.

## 2023-12-13 NOTE — Telephone Encounter (Signed)
 90 days sent.

## 2023-12-23 DIAGNOSIS — E119 Type 2 diabetes mellitus without complications: Secondary | ICD-10-CM | POA: Diagnosis not present

## 2023-12-23 DIAGNOSIS — H40021 Open angle with borderline findings, high risk, right eye: Secondary | ICD-10-CM | POA: Diagnosis not present

## 2023-12-23 DIAGNOSIS — Z961 Presence of intraocular lens: Secondary | ICD-10-CM | POA: Diagnosis not present

## 2023-12-23 DIAGNOSIS — H16223 Keratoconjunctivitis sicca, not specified as Sjogren's, bilateral: Secondary | ICD-10-CM | POA: Diagnosis not present

## 2023-12-23 DIAGNOSIS — H0288A Meibomian gland dysfunction right eye, upper and lower eyelids: Secondary | ICD-10-CM | POA: Diagnosis not present

## 2023-12-23 DIAGNOSIS — H5203 Hypermetropia, bilateral: Secondary | ICD-10-CM | POA: Diagnosis not present

## 2023-12-23 DIAGNOSIS — H52223 Regular astigmatism, bilateral: Secondary | ICD-10-CM | POA: Diagnosis not present

## 2023-12-25 ENCOUNTER — Encounter: Payer: Self-pay | Admitting: *Deleted

## 2023-12-27 ENCOUNTER — Encounter (HOSPITAL_BASED_OUTPATIENT_CLINIC_OR_DEPARTMENT_OTHER): Payer: Self-pay | Admitting: Cardiovascular Disease

## 2023-12-27 ENCOUNTER — Ambulatory Visit (HOSPITAL_BASED_OUTPATIENT_CLINIC_OR_DEPARTMENT_OTHER): Admitting: Cardiovascular Disease

## 2023-12-27 VITALS — BP 112/72 | HR 78 | Ht 73.0 in | Wt 215.2 lb

## 2023-12-27 DIAGNOSIS — I482 Chronic atrial fibrillation, unspecified: Secondary | ICD-10-CM | POA: Diagnosis not present

## 2023-12-27 NOTE — Progress Notes (Signed)
 Cardiology Office Note:  .   Date:  12/27/2023  ID:  KIANDRE Huber, DOB Feb 11, 1951, MRN [id number] PCP: Kennyth Worth HERO, MD  [facility] Providers Cardiologist:  Annabella Scarce, MD    History of Present Illness: Victor Huber is a 73 y.o. male with permanentatrial fibrillation, diabetes, COPD, prior tobacco abuse, hypothyroidism and metastatic non-small cell lung cancer s/p resection and on chemotherapy and prior tobacco abuse here for follow up.  He moved from [place]  05/2016 and established care with Dr. Sherrod.  He was diagnosed with atrial fibrillation in 2012.  In the past he was on amiodarone . However this was discontinued when he was diagnosed with lung cancer in 2016. After that he was switched to sotalol but failed subsequent DCCV.  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  [name] reported fatigue and shortness of breath. He was switched from metoprolol  to atenolol , given that he previously felt better on this medication.  He was also referred to the atrial fibrillation clinic where he discussed using dofetilide, amiodarone , and ablation. He was not felt to be a good candidate for ablation declined antiarrhythmics due to cost.   [name] continues with Keytruda  therapy.  This was stopped for a while due to pancreatitis. He reported exertional dyspnea and fatigue. He had a nuclear stress test 03/2020 which revealed multiple artifacts but no ischemia. We discussed antiarrythmic options and felt dofetilide was best, but he declined due to the need for hospitalization. He was seen in Afib clinic 12/2020 after he was found to have Afib RDR after an endoscopy procedure. His atenolol  had been held that morning. She recommended continuing his current therapy.    [name] reported increased shortness of breath. He was in atrial fibrillation and rates were uncontrolled. Atenolol  was switched to metoprolol . TSH was abnormal. Echo revealed LVEF  40-45% with global hypokinesis and mild LVH. RV function was mildly reduced. He saw Victor Finder, NP 12/2021 and his dyspnea had increased. He switched to bisoprolol  and diuresed with Lasix  due to volume overload. He followed up 01/2022 and his weight was down 4 pounds. He continued to have exertional dyspnea. He was started on Entresto .  At his visit 05/2022 he continued to report shortness of breath and low energy levels.  He noted that he did not feel any different though he did note a few more palpitations.  Rates remain uncontrolled so digoxin  was added.  He followed up with [name], NP 09/2022 and was doing better.  Improved but he did still noted with his eyes.  At his visit 06/2023 he noted persistent shortness of breath since hospitalization for sepsis 5 months prior.  He continued to have hypoxia.  Heart rates were uncontrolled on digoxin .  He was referred back to A-fib clinic.  He saw Dr. Waddell who felt that given his multiple comorbidities he is not a good candidate for AV node ablation and pacemaker implantation.  He increased bisoprolol  and continued digoxin .   Discussed the use of AI scribe software for clinical note transcription with the patient, who gave verbal consent to proceed.  History of Present Illness Victor Huber visited the AFib clinic but did not find the visit helpful. His bisoprolol  dosage was doubled. However, he has experienced episodes of low blood pressure, particularly when visiting doctors, but currently has no dizziness or lightheadedness.  He discontinued Wellbutrin  on his own after experiencing significant dizziness and lightheadedness, which resolved after stopping the  medication.  He continues to experience shortness of breath, which he feels is stable or slightly improved compared to six months ago. His breathing is affected by the time of day and weather conditions, particularly when it is hot and humid.  He notes a weight gain of five pounds, attributing it  to dietary choices.  He is currently taking atorvastatin  for cholesterol, digoxin  and bisoprolol  for atrial fibrillation, Eliquis  for stroke prevention, and Jardiance for diabetes management. His Jardiance dosage was increased recently due to elevated A1c levels. His most recent cholesterol levels were: total cholesterol 115, triglycerides 187, and LDL 48.  No swelling and no significant changes in his symptoms or new concerns.  ROS:  As per HPI  Studies Reviewed: Victor   EKG Interpretation Date/Time:  Friday December 27 2023 11:14:40 EDT Ventricular Rate:  74 PR Interval:    QRS Duration:  104 QT Interval:  390 QTC Calculation: 432 R Axis:   91  Text Interpretation: Atrial fibrillation Rightward axis Incomplete right bundle branch block ST & T wave abnormality, consider anterior ischemia When compared with ECG of 29-Aug-2023 14:02, T wave inversion no longer evident in Inferior leads Confirmed by Raford Riggs ([id number]) on 12/27/2023 11:22:20 AM   Echo 07/23/23:  1. Left ventricular ejection fraction, by estimation, is 60 to 65%. The  left ventricle has normal function. The left ventricle has no regional  wall motion abnormalities. Left ventricular diastolic function could not  be evaluated.   2. Right ventricular systolic function is moderately reduced. The right  ventricular size is normal.   Zio monitor 09/2023: Patch Wear Time:  9 days and 5 hours    100% atrial fibrillation burden Heart rate 42 to 183 bpm, average 85 bpm Less than 1% ventricular ectopy Patient triggered episodes associated with atrial fibrillation    Risk Assessment/Calculations:    CHA2DS2-VASc Score = 4   This indicates a 4.8% annual risk of stroke. The patient's score is based upon: CHF History: 0 HTN History: 0 Diabetes History: 1 Stroke History: 0 Vascular Disease History: 1 Age Score: 2 Gender Score: 0            Physical Exam:   VS:  BP 112/72   Pulse 78   Ht 6' 1 (1.854 m)   Wt 215  lb 3.2 oz (97.6 kg)   SpO2 94%   BMI 28.39 kg/m  , BMI Body mass index is 28.39 kg/m. GENERAL:  Well appearing HEENT: Pupils equal round and reactive, fundi not visualized, oral mucosa unremarkable NECK:  No jugular venous distention, waveform within normal limits, carotid upstroke brisk and symmetric, no bruits, no thyromegaly LUNGS:  Clear to auscultation bilaterally HEART:  Irregularly irregular.  PMI not displaced or sustained,S1 and S2 within normal limits, no S3, no S4, no clicks, no rubs, no murmurs ABD:  Flat, positive bowel sounds normal in frequency in pitch, no bruits, no rebound, no guarding, no midline pulsatile mass, no hepatomegaly, no splenomegaly EXT:  2 plus pulses throughout, no edema, no cyanosis no clubbing SKIN:  No rashes no nodules NEURO:  Cranial nerves II through XII grossly intact, motor grossly intact throughout PSYCH:  Cognitively intact, oriented to person place and time   ASSESSMENT AND PLAN: .    Assessment & Plan # Chronic atrial fibrillation Chronic atrial fibrillation. Bisoprolol  dose not increased due to hypotension risk. No lightheadedness or dizziness after stopping Wellbutrin .  He has been seen by EP.  Not a candidate for antiarrhythmics or ablation.  No AV ablation/PPM.  - Continue bisoprolol  and digoxin  for rate control. - Continue Eliquis  for stroke prevention.  # Shortness of breath # Lung cancer:  Metastatic non-small cell lung cancer.  He is s/p wedge resection, chemo and XRT.  Now on Keytruda  maintenance.  Chronic shortness of breath consistent over six months, varying with activity and environment.  He remains in afib but rates are controlled.  No volume overload on exam.    # Hypertension Low blood pressure readings without symptoms. OK to continue bisoprolol .   # Type 2 diabetes mellitus Type 2 diabetes mellitus with recent Jardiance dosage adjustment for glycemic control. - Continue Jardiance with adjusted dosage.  #  Hyperlipidemia # Aortic atherosclerosisL  Hyperlipidemia well-managed with atorvastatin . Recent lipid panel shows slightly elevated triglycerides, likely due to carbohydrate intake. - Continue atorvastatin  for lipid management.        Dispo: f/u 1 year  Signed, Annabella Scarce, MD

## 2023-12-27 NOTE — Patient Instructions (Signed)
 Medication Instructions:  Your physician recommends that you continue on your current medications as directed. Please refer to the Current Medication list given to you today.  *If you need a refill on your cardiac medications before your next appointment, please call your pharmacy*  Lab Work: NONE  Testing/Procedures: NONE  Follow-Up: At [facility], you and your health needs are our priority.  As part of our continuing mission to provide you with exceptional heart care, we have created designated Provider Care Teams.  These Care Teams include your primary Cardiologist (physician) and Advanced Practice Providers (APPs -  Physician Assistants and Nurse Practitioners) who all work together to provide you with the care you need, when you need it.  We recommend signing up for the patient portal called "MyChart".  Sign up information is provided on this After Visit Summary.  MyChart is used to connect with patients for Virtual Visits (Telemedicine).  Patients are able to view lab/test results, encounter notes, upcoming appointments, etc.  Non-urgent messages can be sent to your provider as well.   To learn more about what you can do with MyChart, go to [email or web address].    Your next appointment:   12 month(s)  The format for your next appointment:   In Person  Provider:   Dr Theodis Fiscal, Creed Dodrill NP, or Charlton Cooler NP

## 2024-01-06 DIAGNOSIS — H0288B Meibomian gland dysfunction left eye, upper and lower eyelids: Secondary | ICD-10-CM | POA: Diagnosis not present

## 2024-01-06 DIAGNOSIS — H0288A Meibomian gland dysfunction right eye, upper and lower eyelids: Secondary | ICD-10-CM | POA: Diagnosis not present

## 2024-01-06 DIAGNOSIS — H16223 Keratoconjunctivitis sicca, not specified as Sjogren's, bilateral: Secondary | ICD-10-CM | POA: Diagnosis not present

## 2024-02-10 ENCOUNTER — Other Ambulatory Visit: Payer: Self-pay | Admitting: Internal Medicine

## 2024-02-10 ENCOUNTER — Inpatient Hospital Stay: Attending: Internal Medicine

## 2024-02-10 ENCOUNTER — Ambulatory Visit (HOSPITAL_COMMUNITY)
Admission: RE | Admit: 2024-02-10 | Discharge: 2024-02-10 | Disposition: A | Discharge status: Home / Self Care | Source: Ambulatory Visit | Attending: Internal Medicine | Admitting: Internal Medicine

## 2024-02-10 DIAGNOSIS — C3490 Malignant neoplasm of unspecified part of unspecified bronchus or lung: Secondary | ICD-10-CM

## 2024-02-10 DIAGNOSIS — I700 Atherosclerosis of aorta: Secondary | ICD-10-CM | POA: Diagnosis not present

## 2024-02-10 DIAGNOSIS — C3492 Malignant neoplasm of unspecified part of left bronchus or lung: Secondary | ICD-10-CM

## 2024-02-10 DIAGNOSIS — C3432 Malignant neoplasm of lower lobe, left bronchus or lung: Secondary | ICD-10-CM | POA: Diagnosis not present

## 2024-02-10 DIAGNOSIS — J439 Emphysema, unspecified: Secondary | ICD-10-CM | POA: Diagnosis not present

## 2024-02-10 DIAGNOSIS — K760 Fatty (change of) liver, not elsewhere classified: Secondary | ICD-10-CM | POA: Diagnosis not present

## 2024-02-10 DIAGNOSIS — C7951 Secondary malignant neoplasm of bone: Secondary | ICD-10-CM | POA: Insufficient documentation

## 2024-02-10 LAB — CBC WITH DIFFERENTIAL (CANCER CENTER ONLY)
Abs Immature Granulocytes: 0.03 K/uL (ref 0.00–0.07)
Basophils Absolute: 0 K/uL (ref 0.0–0.1)
Basophils Relative: 0 %
Eosinophils Absolute: 0 K/uL (ref 0.0–0.5)
Eosinophils Relative: 1 %
HCT: 44.7 % (ref 39.0–52.0)
Hemoglobin: 15.6 g/dL (ref 13.0–17.0)
Immature Granulocytes: 0 %
Lymphocytes Relative: 18 %
Lymphs Abs: 1.5 K/uL (ref 0.7–4.0)
MCH: 32.8 pg (ref 26.0–34.0)
MCHC: 34.9 g/dL (ref 30.0–36.0)
MCV: 94.1 fL (ref 80.0–100.0)
Monocytes Absolute: 0.5 K/uL (ref 0.1–1.0)
Monocytes Relative: 6 %
Neutro Abs: 6 K/uL (ref 1.7–7.7)
Neutrophils Relative %: 75 %
Platelet Count: 171 K/uL (ref 150–400)
RBC: 4.75 MIL/uL (ref 4.22–5.81)
RDW: 13.7 % (ref 11.5–15.5)
WBC Count: 8 K/uL (ref 4.0–10.5)
nRBC: 0 % (ref 0.0–0.2)

## 2024-02-10 LAB — CMP (CANCER CENTER ONLY)
ALT: 22 U/L (ref 0–44)
AST: 22 U/L (ref 15–41)
Albumin: 4.3 g/dL (ref 3.5–5.0)
Alkaline Phosphatase: 68 U/L (ref 38–126)
Anion gap: 7 (ref 5–15)
BUN: 22 mg/dL (ref 8–23)
CO2: 29 mmol/L (ref 22–32)
Calcium: 9.5 mg/dL (ref 8.9–10.3)
Chloride: 102 mmol/L (ref 98–111)
Creatinine: 1 mg/dL (ref 0.61–1.24)
GFR, Estimated: >60 mL/min (ref >60)
Glucose, Bld: 219 mg/dL — ABNORMAL HIGH (ref 70–99)
Potassium: 4.7 mmol/L (ref 3.5–5.1)
Sodium: 138 mmol/L (ref 135–145)
Total Bilirubin: 0.7 mg/dL (ref 0.0–1.2)
Total Protein: 7 g/dL (ref 6.5–8.1)

## 2024-02-10 MED ORDER — SODIUM CHLORIDE (PF) 0.9 % IJ SOLN
INTRAMUSCULAR | Status: AC
Start: 2024-02-10 — End: 2024-02-10
  Filled 2024-02-10: qty 50

## 2024-02-10 MED ORDER — IOHEXOL 300 MG/ML  SOLN
100.0000 mL | Freq: Once | INTRAMUSCULAR | Status: AC | PRN
  Administered 2024-02-10: 100 mL via INTRAVENOUS

## 2024-02-17 ENCOUNTER — Inpatient Hospital Stay: Admitting: Internal Medicine

## 2024-02-17 VITALS — BP 109/54 | HR 67 | Temp 97.6°F | Resp 17 | Ht 73.0 in | Wt 217.0 lb

## 2024-02-17 DIAGNOSIS — C3432 Malignant neoplasm of lower lobe, left bronchus or lung: Secondary | ICD-10-CM | POA: Diagnosis not present

## 2024-02-17 DIAGNOSIS — C3490 Malignant neoplasm of unspecified part of unspecified bronchus or lung: Secondary | ICD-10-CM

## 2024-02-17 DIAGNOSIS — C7951 Secondary malignant neoplasm of bone: Secondary | ICD-10-CM | POA: Diagnosis not present

## 2024-02-17 DIAGNOSIS — H40021 Open angle with borderline findings, high risk, right eye: Secondary | ICD-10-CM | POA: Diagnosis not present

## 2024-02-17 NOTE — Progress Notes (Signed)
 [facility] Telephone:([phone]   Fax:([phone]  OFFICE PROGRESS NOTE  Victor Worth HERO, MD [address] [place] [address]  DIAGNOSIS: Metastatic non-small cell lung cancer initially diagnosed as stage IIIA (T2a, N2, M0) non-small cell lung cancer, poorly differentiated adenocarcinoma presented with left lower lobe lung mass in addition to mediastinal lymphadenopathy.  The patient was diagnosed with metastatic disease involving the left femur as well as left supraclavicular nodal metastases and right paratracheal lymphadenopathy in October 2018.  Biomarker Findings Microsatellite Status - MS-Stable Tumor Mutational Burden - TMB-Low (3 Muts/Mb) Genomic Findings For a complete list of the genes assayed, please refer to the Appendix. STK11 P281fs*6 CDKN2A p14ARF splice site 193+1G>T DAXX E374* MLL2 V4598fs*40 NBN K233fs*5 NOTCH2 R1410H PMS2 splice site 988+1G>A 8 Disease relevant genes with no reportable alterations: EGFR, KRAS, ALK, BRAF, MET, RET, ERBB2, ROS1   PDL1 expression 5%  PRIOR THERAPY: 1) status post wedge resection of the left lower lobe lung mass as well as AP window lymph node dissection but there was residual metastatic mediastinal lymphadenopathy that could not be resected. 2) a course of concurrent chemoradiation with weekly carboplatin  and paclitaxel in [place]  completed 03/01/2015.  3) status post palliative radiotherapy to the left femur metastatic bone disease. 4)  Systemic chemotherapy with carboplatin  for AUC of 5, Alimta  500 mg/M2 and Keytruda  200 mg IV every 3 weeks.  First dose March 07, 2017.  Carboplatin  was discontinued during cycle #2 secondary to hypersensitivity reaction. 5) status post 2 cycles of maintenance treatment with Alimta  and Ketruda (pembrolizumab ).  Alimta  was discontinued secondary to intolerance. 6) Maintenance treatment with single agent Ketruda (pembrolizumab ) status post 49  cycles.  His treatment has  been on hold since January 2022 secondary to intolerance.  CURRENT THERAPY: Observation.  INTERVAL HISTORY: Victor Huber 73 y.o. male returns to the clinic today for 85-month follow-up visit accompanied by his wife.  Discussed the use of AI scribe software for clinical note transcription with the patient, who gave verbal consent to proceed.  History of Present Illness Victor Huber is a 73 year old male with metastatic non-small cell lung cancer who presents for evaluation with repeat CT scan for restaging of his disease. He is accompanied by his wife.  He has a history of metastatic non-small cell lung cancer, initially diagnosed as stage IIIA adenocarcinoma in October 2018. There is no actionable mutation and an expiration of 5%. He was treated with systemic chemotherapy including carboplatin , Alimta , and Keytruda  every three weeks for a total of four cycles, with carboplatin  discontinued during cycle two due to hypersensitivity reaction. Maintenance treatment with single-agent Keytruda  was completed up to 49 cycles, which has been on hold since January 2022 due to intolerance. He is currently under observation.  He feels 'good' overall but notes that his breathing is 'probably getting worse' and he 'can't walk as far' as before, getting 'winded easier'. No cough, hemoptysis, or chest pain. He also denies nausea, vomiting, diarrhea, and weight loss, noting instead a slight weight gain. He mentions a change in vision, which was evaluated by an eye doctor, who provided an explanation that did not relate it to his cancer, according to the patient's recollection.  He notes that his arthritis has been 'hurting more', which he associates with inflammation. He denies any recent respiratory infections or other significant events that might explain his worsening breathing.  In terms of social history, he mentions living in [place]  previously and  now enjoys watching football without a  specific team allegiance.   MEDICAL HISTORY: Past Medical History:  Diagnosis Date   Adenocarcinoma of left lung, stage 3 (HCC) 08/15/2016   Allergy    Anxiety    Arthritis    Atrial fibrillation (HCC) 08/15/2016   Atrial fibrillation (HCC)    Cancer, metastatic to bone [facility])    lung   Cataract    Waiting to schedule bilateral cataract surgery   CHF (congestive heart failure) (HCC) 01/04/2022   COPD (chronic obstructive pulmonary disease) (HCC) 08/15/2016   Depression    Diabetes mellitus, type 2 (HCC)    Dysphagia    Dysrhythmia    a-fib   GERD (gastroesophageal reflux disease)    Heart murmur    atenlol for A Fib/Eliquis    History of chemotherapy    History of radiation therapy    Hyperlipidemia    Hypertension    Hypothyroid 08/15/2016   Longstanding persistent atrial fibrillation (HCC) 08/29/2016   Pathologic fracture    left femur   Pneumonitis    S/P TURP 08/15/2016   T2DM (type 2 diabetes mellitus) (HCC) 05/03/2022   Wears glasses    Wears hearing aid in both ears     ALLERGIES:  is allergic to carboplatin , flecainide, and debrox [carbamide peroxide].  MEDICATIONS:  Current Outpatient Medications  Medication Sig Dispense Refill   acetaminophen  (TYLENOL ) 500 MG tablet Take 500 mg by mouth every 8 (eight) hours as needed for moderate pain.     apixaban  (ELIQUIS ) 5 MG TABS tablet TAKE 1 TABLET TWICE DAILY 180 tablet 2   atorvastatin  (LIPITOR) 40 MG tablet TAKE 1 TABLET EVERY DAY 90 tablet 3   bisoprolol  (ZEBETA ) 10 MG tablet Take 1 tablet (10 mg total) by mouth daily. 90 tablet 3   diazepam  (VALIUM ) 5 MG tablet TAKE 1 TABLET EVERY 12 HOURS AS NEEDED FOR ANXIETY 30 tablet 5   digoxin  (LANOXIN ) 0.125 MG tablet TAKE 1 TABLET EVERY DAY 90 tablet 3   Empagliflozin-metFORMIN  HCl ER (SYNJARDY  XR) 12.07-998 MG TB24 Take 2 tablets by mouth daily. 90 tablet 3   esomeprazole  (NEXIUM ) 40 MG capsule TAKE 1 CAPSULE TWICE DAILY BEFORE A MEAL 180 capsule 3   gabapentin   (NEURONTIN ) 300 MG capsule TAKE 2 CAPSULES AT BEDTIME AS NEEDED 180 capsule 3   levothyroxine  (SYNTHROID ) 175 MCG tablet TAKE 1 TABLET EVERY DAY 90 tablet 3   lipase/protease/amylase (CREON ) 36000 UNITS CPEP capsule Take 4 capsules po during each meal. Take 1-2 capsules po during each snack.( up to 2 snacks daily). 400 capsule 3   mupirocin  ointment (BACTROBAN ) 2 % Apply 1 Application topically 2 (two) times daily. 22 g 0   polyethylene glycol (MIRALAX  / GLYCOLAX ) 17 g packet Take 17 g by mouth daily as needed. 14 each 0   sertraline (ZOLOFT) 25 MG tablet Take 25 mg by mouth daily.     STIOLTO RESPIMAT  2.5-2.5 MCG/ACT AERS INHALE 2 PUFFS INTO THE LUNGS DAILY. 12 g 3   sucralfate  (CARAFATE ) 1 g tablet TAKE 1 TABLET FOUR TIMES DAILY WITH MEALS AND AT BEDTIME 360 tablet 3   tamsulosin  (FLOMAX ) 0.4 MG CAPS capsule TAKE 1 CAPSULE EVERY DAY 90 capsule 3   valACYclovir  (VALTREX ) 1000 MG tablet Take 1 tablet (1,000 mg total) by mouth daily. For 5 days for flareups. 30 tablet 0   No current facility-administered medications for this visit.    SURGICAL HISTORY:  Past Surgical History:  Procedure Laterality Date   BIOPSY  12/21/2019   Procedure: BIOPSY;  Surgeon: Wilhelmenia Aloha Raddle., MD;  Location: [facility] ENDOSCOPY;  Service: Gastroenterology;;   BIOPSY  05/23/2020   Procedure: BIOPSY;  Surgeon: Wilhelmenia Aloha Raddle., MD;  Location: [facility] ENDOSCOPY;  Service: Gastroenterology;;   BRONCHOSCOPY  10/2014   CARDIAC CATHETERIZATION     05/07/12   CARDIOVERSION     x2   COLONOSCOPY     several yrs   DG BIOPSY LUNG Left 10/2014   FNA - Adenocarcinoma    ESOPHAGOGASTRODUODENOSCOPY (EGD) WITH PROPOFOL  N/A 12/21/2019   Procedure: ESOPHAGOGASTRODUODENOSCOPY (EGD) WITH PROPOFOL ;  Surgeon: Wilhelmenia Aloha Raddle., MD;  Location: [facility] ENDOSCOPY;  Service: Gastroenterology;  Laterality: N/A;   ESOPHAGOGASTRODUODENOSCOPY (EGD) WITH PROPOFOL  N/A 05/23/2020   Procedure: ESOPHAGOGASTRODUODENOSCOPY (EGD) WITH  PROPOFOL ;  Surgeon: Wilhelmenia Aloha Raddle., MD;  Location: [facility] ENDOSCOPY;  Service: Gastroenterology;  Laterality: N/A;   ESOPHAGOGASTRODUODENOSCOPY (EGD) WITH PROPOFOL  N/A 06/30/2020   Procedure: ESOPHAGOGASTRODUODENOSCOPY (EGD) WITH PROPOFOL ;  Surgeon: Wilhelmenia Aloha Raddle., MD;  Location: [facility] ENDOSCOPY;  Service: Gastroenterology;  Laterality: N/A;  ultra slim scope avail   EUS N/A 12/21/2019   Procedure: UPPER ENDOSCOPIC ULTRASOUND (EUS) RADIAL;  Surgeon: Wilhelmenia Aloha Raddle., MD;  Location: [facility] ENDOSCOPY;  Service: Gastroenterology;  Laterality: N/A;   FEMUR IM NAIL Left 02/19/2017   FEMUR IM NAIL Left 02/19/2017   Procedure: INTRAMEDULLARY (IM) NAIL FEMORAL;  Surgeon: Josefina Chew, MD;  Location: [facility] OR;  Service: Orthopedics;  Laterality: Left;   IR FLUORO GUIDE PORT INSERTION RIGHT  07/03/2017   IR REMOVAL TUN ACCESS W/ PORT W/O FL MOD SED  08/23/2023   IR US  GUIDE VASC ACCESS RIGHT  07/03/2017   LUNG CANCER SURGERY Left 12/2014   Wedge Resection    MULTIPLE TOOTH EXTRACTIONS     SAVORY DILATION N/A 05/23/2020   Procedure: SAVORY DILATION;  Surgeon: Wilhelmenia Aloha Raddle., MD;  Location: [facility] ENDOSCOPY;  Service: Gastroenterology;  Laterality: N/A;   SAVORY DILATION N/A 06/30/2020   Procedure: SAVORY DILATION;  Surgeon: Wilhelmenia Aloha Raddle., MD;  Location: [facility] ENDOSCOPY;  Service: Gastroenterology;  Laterality: N/A;   Status post TURP     TONSILLECTOMY     UPPER GASTROINTESTINAL ENDOSCOPY      REVIEW OF SYSTEMS:  Constitutional: positive for fatigue Eyes: negative Ears, nose, mouth, throat, and face: negative Respiratory: positive for cough Cardiovascular: negative Gastrointestinal: negative Genitourinary:negative Integument/breast: negative Hematologic/lymphatic: negative Musculoskeletal:negative Neurological: negative Behavioral/Psych: negative Endocrine: negative Allergic/Immunologic: negative   PHYSICAL EXAMINATION: General appearance: alert, cooperative,  fatigued, and no distress Head: Normocephalic, without obvious abnormality, atraumatic Neck: no adenopathy, no JVD, supple, symmetrical, trachea midline, and thyroid  not enlarged, symmetric, no tenderness/mass/nodules Lymph nodes: Cervical, supraclavicular, and axillary nodes normal. Resp: clear to auscultation bilaterally Back: symmetric, no curvature. ROM normal. No CVA tenderness. Cardio: regular rate and rhythm, S1, S2 normal, no murmur, click, rub or gallop GI: soft, non-tender; bowel sounds normal; no masses,  no organomegaly Extremities: extremities normal, atraumatic, no cyanosis or edema Neurologic: Alert and oriented X 3, normal strength and tone. Normal symmetric reflexes. Normal coordination and gait  ECOG PERFORMANCE STATUS: 1 - Symptomatic but completely ambulatory  Blood pressure (!) 109/54, pulse 67, temperature 97.6 F (36.4 C), temperature source Temporal, resp. rate 17, height 6' 1 (1.854 m), weight 217 lb (98.4 kg), SpO2 95%.  LABORATORY DATA: Lab Results  Component Value Date   WBC 8.0 02/10/2024   HGB 15.6 02/10/2024   HCT 44.7 02/10/2024   MCV 94.1 02/10/2024   PLT 171 02/10/2024  Chemistry      Component Value Date/Time   NA 138 02/10/2024 1217   NA 140 07/18/2023 1544   NA 136 04/04/2017 1141   K 4.7 02/10/2024 1217   K 4.4 04/04/2017 1141   CL 102 02/10/2024 1217   CO2 29 02/10/2024 1217   CO2 24 04/04/2017 1141   BUN 22 02/10/2024 1217   BUN 20 07/18/2023 1544   BUN 21.1 04/04/2017 1141   CREATININE 1.00 02/10/2024 1217   CREATININE 1.1 04/04/2017 1141      Component Value Date/Time   CALCIUM  9.5 02/10/2024 1217   CALCIUM  9.1 04/04/2017 1141   ALKPHOS 68 02/10/2024 1217   ALKPHOS 89 04/04/2017 1141   AST 22 02/10/2024 1217   AST 17 04/04/2017 1141   ALT 22 02/10/2024 1217   ALT 18 04/04/2017 1141   BILITOT 0.7 02/10/2024 1217   BILITOT 0.67 04/04/2017 1141       RADIOGRAPHIC STUDIES: CT CHEST ABDOMEN PELVIS W CONTRAST Result  Date: 02/12/2024 CLINICAL DATA:  Non-small-cell lung cancer restaging, status post left lower lobe wedge resection, chemotherapy, and XRT * Tracking Code: BO * EXAM: CT CHEST, ABDOMEN, AND PELVIS WITH CONTRAST TECHNIQUE: Multidetector CT imaging of the chest, abdomen and pelvis was performed following the standard protocol during bolus administration of intravenous contrast. RADIATION DOSE REDUCTION: This exam was performed according to the departmental dose-optimization program which includes automated exposure control, adjustment of the mA and/or kV according to patient size and/or use of iterative reconstruction technique. CONTRAST:  OMNIPAQUE  IOHEXOL  300 MG/ML  SOLN COMPARISON:  08/12/2023 FINDINGS: CT CHEST FINDINGS Cardiovascular: Aortic atherosclerosis. Cardiomegaly. Three-vessel coronary artery calcifications. Unchanged small pericardial effusion. Mediastinum/Nodes: Similar enlarged mediastinal lymph nodes, subcarinal nodes measuring up to 2.7 x 1.5 cm (series 2, image 64). Thyroid  gland, trachea, and esophagus demonstrate no significant findings. Lungs/Pleura: Severe emphysema and diffuse bilateral bronchial wall thickening. Unchanged post treatment/postoperative appearance of an infrahilar mass of the left lower lobe with wedge resection line (series 4, image 85). New, somewhat spiculated consolidation in the peripheral right upper lobe measuring at least 4.3 x 2.2 cm (series 4, image 37). No pleural effusion or pneumothorax. Musculoskeletal: No chest wall abnormality. No acute osseous findings. CT ABDOMEN PELVIS FINDINGS Hepatobiliary: No solid liver abnormality is seen. Hepatic steatosis. Small gallstones. No gallbladder wall thickening, or biliary dilatation. Pancreas: Unremarkable. No pancreatic ductal dilatation or surrounding inflammatory changes. Spleen: Normal in size without significant abnormality. Adrenals/Urinary Tract: Adrenal glands are unremarkable. Kidneys are normal, without renal  calculi, solid lesion, or hydronephrosis. Bladder is unremarkable. Stomach/Bowel: Stomach is within normal limits. Appendix appears normal. No evidence of bowel wall thickening, distention, or inflammatory changes. Large burden of stool and stool balls throughout the colon and rectum. Vascular/Lymphatic: Aortic atherosclerosis. No enlarged abdominal or pelvic lymph nodes. Reproductive: Prostatomegaly.  Vasectomy clips. Other: Small, fat containing inguinal hernias. Small fat containing supraumbilical hernia (series 2, image 89). No ascites. Musculoskeletal: No acute osseous findings. Intramedullary nail fixation of the proximal left femur. IMPRESSION: 1. Unchanged post treatment/postoperative appearance of an infrahilar mass of the left lower lobe with wedge resection line. 2. New, somewhat spiculated consolidation in the peripheral right upper lobe measuring at least 4.3 x 2.2 cm. This is nonspecific and may be infectious or inflammatory, however metastatic disease or new primary lung malignancy is not excluded. Consider short interval follow-up in 6-8 weeks to assess for stability or resolution or alternately PET-CT for metabolic characterization. 3. Similar enlarged mediastinal lymph nodes. 4. No evidence  of lymphadenopathy or metastatic disease in the abdomen or pelvis. 5. Severe emphysema and diffuse bilateral bronchial wall thickening. 6. Cardiomegaly and coronary artery disease. 7. Hepatic steatosis. 8. Cholelithiasis. 9. Prostatomegaly. Aortic Atherosclerosis (ICD10-I70.0) and Emphysema (ICD10-J43.9). Electronically Signed   By: Marolyn JONETTA Jaksch M.D.   On: 02/12/2024 16:56     ASSESSMENT AND PLAN: This is a 73 years old white male with metastatic non-small cell lung cancer, adenocarcinoma with no actionable mutations and PDL 1 expression of 5% that was initially diagnosed as stage IIIa non-small cell lung cancer, adenocarcinoma status post left lower lobectomy with lymph node dissection followed by a course  of concurrent chemoradiation completed in January 2016.  The patient had evidence for disease metastasis in October 2018 with metastatic disease to the left femur as well as left supraclavicular and right paratracheal lymph nodes. The patient is currently on systemic chemotherapy initially was with carboplatin , Alimta  and Keytruda .  Carboplatin  was discontinued secondary to hypersensitivity reaction starting from cycle #2.   He was also treated with 3 cycles of maintenance Alimta  and Ketruda (pembrolizumab ) but Alimta  was discontinued secondary to intolerance. He underwent treatment with maintenance Keytruda  as a single agent status post 49 cycles.  The patient has been on observation since January 2022.  He is doing fine with no concerning complaints. He had repeat CT scan of the chest, abdomen and pelvis performed recently.  I personally independently reviewed the scan and discussed the result with the patient and his wife today.  His scan showed new somewhat spiculated and consolidation in the peripheral right upper lobe, nonspecific and likely infectious or inflammatory in origin but primary lung cancer could not be completely excluded. Assessment and Plan Assessment & Plan Metastatic non-small cell lung cancer (adenocarcinoma) Initially diagnosed as stage IIIA adenocarcinoma in October 2018. No actionable mutation identified. Previously treated with systemic chemotherapy (carboplatin , Alimta , Keytruda ) for four cycles, with carboplatin  discontinued due to hypersensitivity. Completed maintenance treatment with Keytruda  for 49 cycles, currently on hold since January 2022 due to intolerance. Currently under observation with no new symptoms reported except for worsening dyspnea. - Continue observation with repeat CT scan of the chest, abdomen, and pelvis in two months for restaging.  Right upper lobe pulmonary consolidation (suspected inflammation vs. malignancy) Consolidation in the right upper lobe,  likely inflammation, but malignancy cannot be ruled out. Differential diagnosis includes inflammation versus malignancy. No recent history of COVID-19 or pneumonia. Reports worsening dyspnea but no cough, hemoptysis, or chest pain. Estimated 60% likelihood of inflammation and 40% likelihood of malignancy. - Will repeat CT scan in two months to monitor consolidation. - Advised to report any new symptoms such as increased dyspnea or chest pain. He was advised to call immediately if he has any other concerning symptoms in the interval.  The patient voices understanding of current disease status and treatment options and is in agreement with the current care plan. All questions were answered. The patient knows to call the clinic with any problems, questions or concerns. We can certainly see the patient much sooner if necessary. The total time spent in the appointment was 30 minutes including review of chart and various tests results, discussions about plan of care and coordination of care plan .  Disclaimer: This note was dictated with voice recognition software. Similar sounding words can inadvertently be transcribed and may not be corrected upon review.

## 2024-02-18 ENCOUNTER — Telehealth: Payer: Self-pay | Admitting: Internal Medicine

## 2024-02-18 NOTE — Telephone Encounter (Signed)
 Scheduled patient for next appointment. Called and left a voicemail with the details.

## 2024-02-20 ENCOUNTER — Ambulatory Visit (INDEPENDENT_AMBULATORY_CARE_PROVIDER_SITE_OTHER): Admitting: Family Medicine

## 2024-02-20 ENCOUNTER — Encounter: Payer: Self-pay | Admitting: Family Medicine

## 2024-02-20 VITALS — BP 118/60 | HR 87 | Temp 97.2°F | Ht 73.0 in | Wt 213.0 lb

## 2024-02-20 DIAGNOSIS — F321 Major depressive disorder, single episode, moderate: Secondary | ICD-10-CM

## 2024-02-20 DIAGNOSIS — E1169 Type 2 diabetes mellitus with other specified complication: Secondary | ICD-10-CM

## 2024-02-20 DIAGNOSIS — I482 Chronic atrial fibrillation, unspecified: Secondary | ICD-10-CM

## 2024-02-20 DIAGNOSIS — J449 Chronic obstructive pulmonary disease, unspecified: Secondary | ICD-10-CM | POA: Diagnosis not present

## 2024-02-20 DIAGNOSIS — C778 Secondary and unspecified malignant neoplasm of lymph nodes of multiple regions: Secondary | ICD-10-CM | POA: Diagnosis not present

## 2024-02-20 DIAGNOSIS — I5022 Chronic systolic (congestive) heart failure: Secondary | ICD-10-CM

## 2024-02-20 DIAGNOSIS — Z7984 Long term (current) use of oral hypoglycemic drugs: Secondary | ICD-10-CM

## 2024-02-20 DIAGNOSIS — Z23 Encounter for immunization: Secondary | ICD-10-CM

## 2024-02-20 DIAGNOSIS — E119 Type 2 diabetes mellitus without complications: Secondary | ICD-10-CM | POA: Diagnosis not present

## 2024-02-20 LAB — POCT GLYCOSYLATED HEMOGLOBIN (HGB A1C): Hemoglobin A1C: 7.9 % — AB (ref 4.0–5.6)

## 2024-02-20 MED ORDER — SERTRALINE HCL 25 MG PO TABS: 25.0000 mg | ORAL_TABLET | Freq: Every day | ORAL | 3 refills | Status: AC

## 2024-02-20 MED ORDER — TRELEGY ELLIPTA 100-62.5-25 MCG/ACT IN AEPB: 1.0000 | INHALATION_SPRAY | Freq: Every day | RESPIRATORY_TRACT | 11 refills | Status: AC

## 2024-02-20 MED ORDER — DULOXETINE HCL 60 MG PO CPEP: 60.0000 mg | ORAL_CAPSULE | Freq: Every day | ORAL | 3 refills | Status: AC

## 2024-02-20 NOTE — Progress Notes (Signed)
 Victor Huber is a 73 y.o. male who presents today for an office visit.  Assessment/Plan:   Chronic Problems Addressed Today: T2DM (type 2 diabetes mellitus) (HCC) A1c stable 7.9.  Does occasionally forgets to take his second dose of Synjardy .  We did discuss taking both tablets in the morning however he will try to make a better effort and not forgetting his evening dose.  Continue with Synjardy  12.07-998 twice daily.  Recheck in 3 months.  May consider GLP agonist in the future if A1c is not well-controlled.  Depression, major, single episode, moderate (HCC)  he would like for us  to take over management for his psychiatric medications.  He is currently on Cymbalta  60 mg daily and sertraline 25 mg daily.  Doing well with this combination.  Mood is very well-controlled.  Discussed with patient that dual antidepressants is an atypical regimen that would be managed by PCP however due to him being on stable regimen would be reasonable for us  to take over at this point.  Will send in refill for him today.  Follow-up in 3 months.  He was previously seeing a therapist however has not been seeing them recently as he did not find it was beneficial.  We did discuss having him follow-up with a different therapist however he declined referral today.  He will let us  know if he changes his mind.  Chronic obstructive pulmonary disease (HCC) Symptoms are not optimally controlled.  He follows with pulmonology and is on Stiolto.  Advised him to follow back up with pulmonology soon however we will switch his inhaler to Trelegy to see if this gives him better relief.  He will follow-up with us  in a few weeks via MyChart.  Recheck in office in 3 months.  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  Chronic systolic (congestive) heart failure (HCC) Continue management per cardiology.  Euvolemic today.  Chronic atrial fibrillation  (HCC) Follows with cardiology.  Anticoagulated on Eliquis  and rate controlled on bisoprolol .  Flu shot given today.    Subjective:  HPI:  See assessment / plan for status of chronic conditions.    Discussed the use of AI scribe software for clinical note transcription with the patient, who gave verbal consent to proceed.  History of Present Illness Victor Huber is a 73 year old male with COPD and diabetes who presents for medication management and follow-up.  He was last here 3 months ago.  A1c at that time was 8.1.  He was continued on Synjardy .  Since her last visit he has been following with psychiatry.  They have been managing his medications and he is currently on sertraline 25 mg daily duloxetine  60 mg daily.  Overall mood is very well-controlled.  He was previously seeing a therapist however he is no longer seeing them over the last few months as he did not feel that he was getting much more benefit from their sessions.  He would like for us  to take over management for his medications now that he is on a stable regimen.  He is tolerating well without any significant side effects.  He has diabetes and is taking Synjardy  twice daily, although he sometimes forgets the second dose. His last A1c was 7.9, and he does not regularly check his blood sugar at home, relying on lab tests every six months.  He has COPD and experiences worsening shortness of breath, particularly during physical activities such  as household chores or walking to the bathroom. No cough or wheezing. He uses Stiolto inhaler, two puffs in the morning, but feels it does not significantly impact his symptoms.  He is on Eliquis  twice daily for atrial fibrillation and recently visited an AFib clinic but was told no further intervention was needed at this time.         Objective:  Physical Exam: BP 118/60   Pulse 87   Temp (!) 97.2 F (36.2 C) (Temporal)   Ht 6' 1 (1.854 m)   Wt 213 lb (96.6 kg)    SpO2 97%   BMI 28.10 kg/m   Gen: No acute distress, resting comfortably CV: Irregular with no murmurs appreciated Pulm: Normal work of breathing, clear to auscultation bilaterally with no crackles, wheezes, or rhonchi Neuro: Grossly normal, moves all extremities Psych: Normal affect and thought content      Sade Mehlhoff M. Kennyth, MD 02/20/2024 2:36 PM

## 2024-02-20 NOTE — Assessment & Plan Note (Signed)
 A1c stable 7.9.  Does occasionally forgets to take his second dose of Synjardy .  We did discuss taking both tablets in the morning however he will try to make a better effort and not forgetting his evening dose.  Continue with Synjardy  12.07-998 twice daily.  Recheck in 3 months.  May consider GLP agonist in the future if A1c is not well-controlled.

## 2024-02-20 NOTE — Assessment & Plan Note (Signed)
 Symptoms are not optimally controlled.  He follows with pulmonology and is on Stiolto.  Advised him to follow back up with pulmonology soon however we will switch his inhaler to Trelegy to see if this gives him better relief.  He will follow-up with us  in a few weeks via MyChart.  Recheck in office in 3 months.

## 2024-02-20 NOTE — Assessment & Plan Note (Signed)
 he would like for us  to take over management for his psychiatric medications.  He is currently on Cymbalta  60 mg daily and sertraline 25 mg daily.  Doing well with this combination.  Mood is very well-controlled.  Discussed with patient that dual antidepressants is an atypical regimen that would be managed by PCP however due to him being on stable regimen would be reasonable for us  to take over at this point.  Will send in refill for him today.  Follow-up in 3 months.  He was previously seeing a therapist however has not been seeing them recently as he did not find it was beneficial.  We did discuss having him follow-up with a different therapist however he declined referral today.  He will let us  know if he changes his mind.

## 2024-02-20 NOTE — Assessment & Plan Note (Signed)
Continue management per oncology. 

## 2024-02-20 NOTE — Patient Instructions (Signed)
 It was very nice to see you today!  VISIT SUMMARY: Today, we reviewed your medications and discussed your ongoing health concerns, including diabetes, COPD, and mental health. We made some adjustments to your treatment plan to better manage your symptoms and improve your overall health.  YOUR PLAN: TYPE 2 DIABETES MELLITUS: Your A1c level has improved to 7.9, but you sometimes miss your second dose of Synjardy  and do not regularly check your blood sugar at home. -Continue taking Synjardy  12.07-998 mg twice daily. If it is easier, you can take both doses at once. -We will reassess your condition in 3 months.  DEPRESSION: Your mood is stable with sertraline and duloxetine , but you are not satisfied with your current mental health provider. -Continue taking sertraline 25 mg daily and duloxetine  60 mg daily. -We can refer you to a different therapist if you would like.  CHRONIC OBSTRUCTIVE PULMONARY DISEASE (COPD): You are experiencing worsening shortness of breath with activity, and your current inhaler is not effective. -We have prescribed a Trelegy inhaler for you to use. -Please follow up with your pulmonologist.  PULMONARY CONSOLIDATION, UNDER EVALUATION: A recent CT scan showed a possible infection, but you have no cough or congestion. -You will need a follow-up CT scan in 2 months.  CHRONIC ATRIAL FIBRILLATION: Your condition is stable with your current medication. -Continue taking Eliquis  as prescribed.  CHRONIC SYSTOLIC HEART FAILURE: Your recent echocardiogram showed good heart function. -Continue with your current management plan.  GENERAL HEALTH MAINTENANCE: You are due for your flu shot and COVID booster. -You received your flu shot today. -Please get your COVID booster at the pharmacy.  Return in about 3 months (around 05/22/2024) for Follow Up.   Take care, Dr Kennyth  PLEASE NOTE:  If you had any lab tests, please let us  know if you have not heard back within a few  days. You may see your results on mychart before we have a chance to review them but we will give you a call once they are reviewed by us .   If we ordered any referrals today, please let us  know if you have not heard from their office within the next week.   If you had any urgent prescriptions sent in today, please check with the pharmacy within an hour of our visit to make sure the prescription was transmitted appropriately.   Please try these tips to maintain a healthy lifestyle:  Eat at least 3 REAL meals and 1-2 snacks per day.  Aim for no more than 5 hours between eating.  If you eat breakfast, please do so within one hour of getting up.   Each meal should contain half fruits/vegetables, one quarter protein, and one quarter carbs (no bigger than a computer mouse)  Cut down on sweet beverages. This includes juice, soda, and sweet tea.   Drink at least 1 glass of water  with each meal and aim for at least 8 glasses per day  Exercise at least 150 minutes every week.

## 2024-02-20 NOTE — Assessment & Plan Note (Signed)
Continue management per cardiology.  Euvolemic today.

## 2024-02-20 NOTE — Assessment & Plan Note (Signed)
 Follows with cardiology.  Anticoagulated on Eliquis  and rate controlled on bisoprolol .

## 2024-02-24 ENCOUNTER — Encounter: Payer: Self-pay | Admitting: Pharmacist

## 2024-02-24 NOTE — Progress Notes (Signed)
 Pharmacy Quality Measure Review  This patient is appearing on a report for being at risk of failing the adherence measure for cholesterol (statin) medications this calendar year.   Medication: atorvastatin   Last fill date: 10/25/2023 for 90 day supply  Reviewed recent refill history in Dr Annemarie database. Actual last refill date was 02/12/2024 for 90 day supply. Patient has 2 refills remaining. Next appointment with PCP is 05/22/2024.    Insurance report was not up to date. No action needed at this time.   Madelin Ray, PharmD Clinical Pharmacist [facility]  Population Health [phone]

## 2024-03-30 ENCOUNTER — Other Ambulatory Visit: Payer: Self-pay | Admitting: Gastroenterology

## 2024-04-07 ENCOUNTER — Ambulatory Visit (HOSPITAL_COMMUNITY)
Admission: RE | Admit: 2024-04-07 | Discharge: 2024-04-07 | Disposition: A | Discharge status: Home / Self Care | Source: Ambulatory Visit | Attending: Internal Medicine | Admitting: Internal Medicine

## 2024-04-07 ENCOUNTER — Encounter (HOSPITAL_COMMUNITY): Payer: Self-pay

## 2024-04-07 ENCOUNTER — Inpatient Hospital Stay: Attending: Internal Medicine

## 2024-04-07 DIAGNOSIS — C3490 Malignant neoplasm of unspecified part of unspecified bronchus or lung: Secondary | ICD-10-CM

## 2024-04-07 LAB — CBC WITH DIFFERENTIAL (CANCER CENTER ONLY)
Abs Immature Granulocytes: 0.03 K/uL (ref 0.00–0.07)
Basophils Absolute: 0 K/uL (ref 0.0–0.1)
Basophils Relative: 0 %
Eosinophils Absolute: 0 K/uL (ref 0.0–0.5)
Eosinophils Relative: 1 %
HCT: 43 % (ref 39.0–52.0)
Hemoglobin: 15.3 g/dL (ref 13.0–17.0)
Immature Granulocytes: 0 %
Lymphocytes Relative: 18 %
Lymphs Abs: 1.3 K/uL (ref 0.7–4.0)
MCH: 33.6 pg (ref 26.0–34.0)
MCHC: 35.6 g/dL (ref 30.0–36.0)
MCV: 94.5 fL (ref 80.0–100.0)
Monocytes Absolute: 0.4 K/uL (ref 0.1–1.0)
Monocytes Relative: 6 %
Neutro Abs: 5.4 K/uL (ref 1.7–7.7)
Neutrophils Relative %: 75 %
Platelet Count: 152 K/uL (ref 150–400)
RBC: 4.55 MIL/uL (ref 4.22–5.81)
RDW: 14 % (ref 11.5–15.5)
WBC Count: 7.3 K/uL (ref 4.0–10.5)
nRBC: 0 % (ref 0.0–0.2)

## 2024-04-07 LAB — CMP (CANCER CENTER ONLY)
ALT: 31 U/L (ref 0–44)
AST: 41 U/L (ref 15–41)
Albumin: 4.5 g/dL (ref 3.5–5.0)
Alkaline Phosphatase: 69 U/L (ref 38–126)
Anion gap: 9 (ref 5–15)
BUN: 24 mg/dL — ABNORMAL HIGH (ref 8–23)
CO2: 26 mmol/L (ref 22–32)
Calcium: 9.4 mg/dL (ref 8.9–10.3)
Chloride: 101 mmol/L (ref 98–111)
Creatinine: 0.95 mg/dL (ref 0.61–1.24)
GFR, Estimated: >60 mL/min
Glucose, Bld: 191 mg/dL — ABNORMAL HIGH (ref 70–99)
Potassium: 4.3 mmol/L (ref 3.5–5.1)
Sodium: 137 mmol/L (ref 135–145)
Total Bilirubin: 0.5 mg/dL (ref 0.0–1.2)
Total Protein: 6.9 g/dL (ref 6.5–8.1)

## 2024-04-07 MED ORDER — IOHEXOL 300 MG/ML  SOLN
100.0000 mL | Freq: Once | INTRAMUSCULAR | Status: AC | PRN
  Administered 2024-04-07: 100 mL via INTRAVENOUS

## 2024-04-08 ENCOUNTER — Ambulatory Visit (HOSPITAL_COMMUNITY)

## 2024-04-15 ENCOUNTER — Inpatient Hospital Stay: Admitting: Internal Medicine

## 2024-04-15 VITALS — BP 115/74 | HR 84 | Temp 97.5°F | Resp 17 | Ht 73.0 in | Wt 220.0 lb

## 2024-04-15 DIAGNOSIS — C3490 Malignant neoplasm of unspecified part of unspecified bronchus or lung: Secondary | ICD-10-CM

## 2024-04-15 NOTE — Progress Notes (Signed)
 "     [facility] [phone]   Fax:([phone]  OFFICE PROGRESS NOTE  Kennyth Worth HERO, MD [address] [place] [address]  DIAGNOSIS: Metastatic non-small cell lung cancer initially diagnosed as stage IIIA (T2a, N2, M0) non-small cell lung cancer, poorly differentiated adenocarcinoma presented with left lower lobe lung mass in addition to mediastinal lymphadenopathy.  The patient was diagnosed with metastatic disease involving the left femur as well as left supraclavicular nodal metastases and right paratracheal lymphadenopathy in October 2018.  Biomarker Findings Microsatellite Status - MS-Stable Tumor Mutational Burden - TMB-Low (3 Muts/Mb) Genomic Findings For a complete list of the genes assayed, please refer to the Appendix. STK11 P281fs*6 CDKN2A p14ARF splice site 193+1G>T DAXX E374* MLL2 V4511fs*40 NBN K233fs*5 NOTCH2 R1410H PMS2 splice site 988+1G>A 8 Disease relevant genes with no reportable alterations: EGFR, KRAS, ALK, BRAF, MET, RET, ERBB2, ROS1   PDL1 expression 5%  PRIOR THERAPY: 1) status post wedge resection of the left lower lobe lung mass as well as AP window lymph node dissection but there was residual metastatic mediastinal lymphadenopathy that could not be resected. 2) a course of concurrent chemoradiation with weekly carboplatin  and paclitaxel in [place]  completed 03/01/2015.  3) status post palliative radiotherapy to the left femur metastatic bone disease. 4)  Systemic chemotherapy with carboplatin  for AUC of 5, Alimta  500 mg/M2 and Keytruda  200 mg IV every 3 weeks.  First dose March 07, 2017.  Carboplatin  was discontinued during cycle #2 secondary to hypersensitivity reaction. 5) status post 2 cycles of maintenance treatment with Alimta  and Ketruda (pembrolizumab ).  Alimta  was discontinued secondary to intolerance. 6) Maintenance treatment with single agent Ketruda (pembrolizumab ) status post 49  cycles.  His treatment has  been on hold since January 2022 secondary to intolerance.  CURRENT THERAPY: Observation.  INTERVAL HISTORY: Victor Huber 74 y.o. male returns to the clinic today for 100-month follow-up visit accompanied by his wife.  Discussed the use of AI scribe software for clinical note transcription with the patient, who gave verbal consent to proceed.  History of Present Illness Victor Huber is a 74 year old male with metastatic poorly differentiated lung adenocarcinoma who presents for routine follow-up and restaging imaging.  He was diagnosed with metastatic non-small cell lung adenocarcinoma in October 2018 and previously received four cycles of carboplatin , pemetrexed , and pembrolizumab , followed by maintenance pembrolizumab  for a total of 49 cycles. He has been off all cancer-directed therapy since January 2022 and remains under surveillance.  He endorses mild exertional dyspnea, which may be slightly worse and is more pronounced in colder weather. He denies recent respiratory infections, inflammation, or flu-like symptoms. He notes decreased exercise activity, attributing this in part to colder weather.  He reports a weight gain of approximately 5-10 pounds and decreased appetite. He consumes ice cream nightly and recognizes the need to improve his diet and increase physical activity.    MEDICAL HISTORY: Past Medical History:  Diagnosis Date   Adenocarcinoma of left lung, stage 3 (HCC) 08/15/2016   Allergy    Anxiety    Arthritis    Atrial fibrillation (HCC) 08/15/2016   Atrial fibrillation (HCC)    Cancer, metastatic to bone [facility])    lung   Cataract    Waiting to schedule bilateral cataract surgery   CHF (congestive heart failure) (HCC) 01/04/2022   COPD (chronic obstructive pulmonary disease) (HCC) 08/15/2016   Depression    Diabetes mellitus, type 2 (HCC)    Dysphagia  Dysrhythmia    a-fib   GERD (gastroesophageal reflux disease)    Heart murmur    atenlol for  A Fib/Eliquis    History of chemotherapy    History of radiation therapy    Hyperlipidemia    Hypertension    Hypothyroid 08/15/2016   Longstanding persistent atrial fibrillation (HCC) 08/29/2016   Pathologic fracture    left femur   Pneumonitis    S/P TURP 08/15/2016   T2DM (type 2 diabetes mellitus) (HCC) 05/03/2022   Wears glasses    Wears hearing aid in both ears     ALLERGIES:  is allergic to carboplatin , flecainide, and debrox [carbamide peroxide].  MEDICATIONS:  Current Outpatient Medications  Medication Sig Dispense Refill   acetaminophen  (TYLENOL ) 500 MG tablet Take 500 mg by mouth every 8 (eight) hours as needed for moderate pain.     apixaban  (ELIQUIS ) 5 MG TABS tablet TAKE 1 TABLET TWICE DAILY 180 tablet 2   atorvastatin  (LIPITOR) 40 MG tablet TAKE 1 TABLET EVERY DAY 90 tablet 3   bisoprolol  (ZEBETA ) 10 MG tablet Take 1 tablet (10 mg total) by mouth daily. 90 tablet 3   diazepam  (VALIUM ) 5 MG tablet TAKE 1 TABLET EVERY 12 HOURS AS NEEDED FOR ANXIETY 30 tablet 5   digoxin  (LANOXIN ) 0.125 MG tablet TAKE 1 TABLET EVERY DAY 90 tablet 3   DULoxetine  (CYMBALTA ) 60 MG capsule Take 1 capsule (60 mg total) by mouth daily. 90 capsule 3   Empagliflozin-metFORMIN  HCl ER (SYNJARDY  XR) 12.07-998 MG TB24 Take 2 tablets by mouth daily. 90 tablet 3   esomeprazole  (NEXIUM ) 40 MG capsule TAKE 1 CAPSULE TWICE DAILY BEFORE A MEAL 180 capsule 3   Fluticasone-Umeclidin-Vilant (TRELEGY ELLIPTA ) 100-62.5-25 MCG/ACT AEPB Inhale 1 puff into the lungs daily. 1 each 11   gabapentin  (NEURONTIN ) 300 MG capsule TAKE 2 CAPSULES AT BEDTIME AS NEEDED 180 capsule 3   levothyroxine  (SYNTHROID ) 175 MCG tablet TAKE 1 TABLET EVERY DAY 90 tablet 3   lipase/protease/amylase (CREON ) 36000 UNITS CPEP capsule Take 4 capsules po during each meal. Take 1-2 capsules po during each snack.( up to 2 snacks daily). 400 capsule 3   mupirocin  ointment (BACTROBAN ) 2 % Apply 1 Application topically 2 (two) times daily. 22 g 0    polyethylene glycol (MIRALAX  / GLYCOLAX ) 17 g packet Take 17 g by mouth daily as needed. 14 each 0   sertraline  (ZOLOFT ) 25 MG tablet Take 1 tablet (25 mg total) by mouth daily. 90 tablet 3   sucralfate  (CARAFATE ) 1 g tablet TAKE 1 TABLET FOUR TIMES DAILY WITH MEALS AND AT BEDTIME 360 tablet 3   tamsulosin  (FLOMAX ) 0.4 MG CAPS capsule TAKE 1 CAPSULE EVERY DAY 90 capsule 3   valACYclovir  (VALTREX ) 1000 MG tablet Take 1 tablet (1,000 mg total) by mouth daily. For 5 days for flareups. 30 tablet 0   No current facility-administered medications for this visit.    SURGICAL HISTORY:  Past Surgical History:  Procedure Laterality Date   BIOPSY  12/21/2019   Procedure: BIOPSY;  Surgeon: Wilhelmenia Aloha Raddle., MD;  Location: [facility] ENDOSCOPY;  Service: Gastroenterology;;   BIOPSY  05/23/2020   Procedure: BIOPSY;  Surgeon: Wilhelmenia Aloha Raddle., MD;  Location: [facility] ENDOSCOPY;  Service: Gastroenterology;;   BRONCHOSCOPY  10/2014   CARDIAC CATHETERIZATION     05/07/12   CARDIOVERSION     x2   COLONOSCOPY     several yrs   DG BIOPSY LUNG Left 10/2014   FNA - Adenocarcinoma    ESOPHAGOGASTRODUODENOSCOPY (  EGD) WITH PROPOFOL  N/A 12/21/2019   Procedure: ESOPHAGOGASTRODUODENOSCOPY (EGD) WITH PROPOFOL ;  Surgeon: Wilhelmenia Aloha Raddle., MD;  Location: [facility] ENDOSCOPY;  Service: Gastroenterology;  Laterality: N/A;   ESOPHAGOGASTRODUODENOSCOPY (EGD) WITH PROPOFOL  N/A 05/23/2020   Procedure: ESOPHAGOGASTRODUODENOSCOPY (EGD) WITH PROPOFOL ;  Surgeon: Wilhelmenia Aloha Raddle., MD;  Location: [facility] ENDOSCOPY;  Service: Gastroenterology;  Laterality: N/A;   ESOPHAGOGASTRODUODENOSCOPY (EGD) WITH PROPOFOL  N/A 06/30/2020   Procedure: ESOPHAGOGASTRODUODENOSCOPY (EGD) WITH PROPOFOL ;  Surgeon: Wilhelmenia Aloha Raddle., MD;  Location: [facility] ENDOSCOPY;  Service: Gastroenterology;  Laterality: N/A;  ultra slim scope avail   EUS N/A 12/21/2019   Procedure: UPPER ENDOSCOPIC ULTRASOUND (EUS) RADIAL;  Surgeon: Wilhelmenia Aloha Raddle.,  MD;  Location: [facility] ENDOSCOPY;  Service: Gastroenterology;  Laterality: N/A;   FEMUR IM NAIL Left 02/19/2017   FEMUR IM NAIL Left 02/19/2017   Procedure: INTRAMEDULLARY (IM) NAIL FEMORAL;  Surgeon: Josefina Chew, MD;  Location: [facility] OR;  Service: Orthopedics;  Laterality: Left;   IR FLUORO GUIDE PORT INSERTION RIGHT  07/03/2017   IR REMOVAL TUN ACCESS W/ PORT W/O FL MOD SED  08/23/2023   IR US  GUIDE VASC ACCESS RIGHT  07/03/2017   LUNG CANCER SURGERY Left 12/2014   Wedge Resection    MULTIPLE TOOTH EXTRACTIONS     SAVORY DILATION N/A 05/23/2020   Procedure: SAVORY DILATION;  Surgeon: Wilhelmenia Aloha Raddle., MD;  Location: [facility] ENDOSCOPY;  Service: Gastroenterology;  Laterality: N/A;   SAVORY DILATION N/A 06/30/2020   Procedure: SAVORY DILATION;  Surgeon: Wilhelmenia Aloha Raddle., MD;  Location: [facility] ENDOSCOPY;  Service: Gastroenterology;  Laterality: N/A;   Status post TURP     TONSILLECTOMY     UPPER GASTROINTESTINAL ENDOSCOPY      REVIEW OF SYSTEMS:  Constitutional: positive for fatigue Eyes: negative Ears, nose, mouth, throat, and face: negative Respiratory: positive for dyspnea on exertion Cardiovascular: negative Gastrointestinal: negative Genitourinary:negative Integument/breast: negative Hematologic/lymphatic: negative Musculoskeletal:negative Neurological: negative Behavioral/Psych: negative Endocrine: negative Allergic/Immunologic: negative   PHYSICAL EXAMINATION: General appearance: alert, cooperative, fatigued, and no distress Head: Normocephalic, without obvious abnormality, atraumatic Neck: no adenopathy, no JVD, supple, symmetrical, trachea midline, and thyroid  not enlarged, symmetric, no tenderness/mass/nodules Lymph nodes: Cervical, supraclavicular, and axillary nodes normal. Resp: clear to auscultation bilaterally Back: symmetric, no curvature. ROM normal. No CVA tenderness. Cardio: regular rate and rhythm, S1, S2 normal, no murmur, click, rub or gallop GI: soft,  non-tender; bowel sounds normal; no masses,  no organomegaly Extremities: extremities normal, atraumatic, no cyanosis or edema Neurologic: Alert and oriented X 3, normal strength and tone. Normal symmetric reflexes. Normal coordination and gait  ECOG PERFORMANCE STATUS: 1 - Symptomatic but completely ambulatory  Blood pressure 115/74, pulse 84, temperature (!) 97.5 F (36.4 C), temperature source Temporal, resp. rate 17, height 6' 1 (1.854 m), weight 220 lb (99.8 kg), SpO2 92%.  LABORATORY DATA: Lab Results  Component Value Date   WBC 7.3 04/07/2024   HGB 15.3 04/07/2024   HCT 43.0 04/07/2024   MCV 94.5 04/07/2024   PLT 152 04/07/2024      Chemistry      Component Value Date/Time   NA 137 04/07/2024 1232   NA 140 07/18/2023 1544   NA 136 04/04/2017 1141   K 4.3 04/07/2024 1232   K 4.4 04/04/2017 1141   CL 101 04/07/2024 1232   CO2 26 04/07/2024 1232   CO2 24 04/04/2017 1141   BUN 24 (H) 04/07/2024 1232   BUN 20 07/18/2023 1544   BUN 21.1 04/04/2017 1141   CREATININE 0.95 04/07/2024 1232  CREATININE 1.1 04/04/2017 1141      Component Value Date/Time   CALCIUM  9.4 04/07/2024 1232   CALCIUM  9.1 04/04/2017 1141   ALKPHOS 69 04/07/2024 1232   ALKPHOS 89 04/04/2017 1141   AST 41 04/07/2024 1232   AST 17 04/04/2017 1141   ALT 31 04/07/2024 1232   ALT 18 04/04/2017 1141   BILITOT 0.5 04/07/2024 1232   BILITOT 0.67 04/04/2017 1141       RADIOGRAPHIC STUDIES: CT CHEST ABDOMEN PELVIS W CONTRAST Result Date: 04/14/2024 EXAM: CT CHEST, ABDOMEN AND PELVIS WITH CONTRAST 04/07/2024 02:34:58 PM TECHNIQUE: CT of the chest, abdomen and pelvis was performed with the administration of 100 mL iohexol  (OMNIPAQUE ) 300 MG/ML solution. Multiplanar reformatted images are provided for review. Automated exposure control, iterative reconstruction, and/or weight based adjustment of the mA/kV was utilized to reduce the radiation dose to as low as reasonably achievable. COMPARISON: 02/10/2024  CLINICAL HISTORY: Non-small cell lung cancer (NSCLC), staging. * Tracking Code: BO * FINDINGS: CHEST: MEDIASTINUM AND LYMPH NODES: Right heart enlargement. Aortic valve calcifications. Right paratracheal node 1.0 cm in short axis on image 19 series 2, formerly the same by my measurements. Subcarinal node 1.6 cm in short axis on image 32 series 2, formerly the same by my measurements. The central airways are clear. No axillary lymphadenopathy. LUNGS AND PLEURA: Subpleural reticulation with emphysema. Stable bandlike scarring in the right upper lobe. Right infrahilar staples and surrounding consolidation representing a combination of postoperative findings and likely radiation therapy related findings. Partially obscured by major fissure, no substantive change. No pulmonary edema. No pleural effusion. No pneumothorax. VASCULATURE (Chest): Chest atherosclerosis. BONES AND SOFT TISSUES (Chest): Chronic nonunited left 7th rib fracture laterally. Thoracic spondylosis. No focal soft tissue abnormality. ABDOMEN AND PELVIS: LIVER: Suspected hepatic steatosis. GALLBLADDER AND BILE DUCTS: Multiple dependent gallstones in the gallbladder. No biliary ductal dilatation. SPLEEN: No acute abnormality. PANCREAS: Atrophic pancreas. ADRENAL GLANDS: No acute abnormality. KIDNEYS, URETERS AND BLADDER: Benign left renal cyst warrants no further imaging or workup. Per consensus, no follow-up is needed for simple Bosniak type 1 and 2 renal cysts, unless the patient has a malignancy history or risk factors. No stones in the kidneys or ureters. No hydronephrosis. No perinephric or periureteral stranding. Urinary bladder is unremarkable. GI AND BOWEL: Prominence of stool throughout the colon, favoring constipation. Stomach demonstrates no acute abnormality. There is no bowel obstruction. REPRODUCTIVE ORGANS: Mild prostatomegaly. PERITONEUM AND RETROPERITONEUM: No ascites. No free air. VASCULATURE: Systemic atherosclerosis is present,  including the aorta and iliac arteries. ABDOMINAL AND PELVIS LYMPH NODES: No lymphadenopathy. BONES AND SOFT TISSUES: Left proximal femoral nail system. Lumbar spondylosis and degenerative disc disease. No focal soft tissue abnormality. IMPRESSION: 1. Stable post-therapy related findings in the left infrahilar region and stable mildly prominent right paratracheal and subcarinal lymph nodes. 2. No evidence of metastatic disease or definite progression. 3. Suspected hepatic steatosis. 4. Multiple dependent gallstones in the gallbladder. 5. Mild prostatomegaly. 6. Prominence of stool throughout the colon, favoring constipation. 7. Systemic atherosclerosis is present, including the aorta and iliac arteries. 8. Chest atherosclerosis right heart enlargement. 9. Aortic valve calcifications. Electronically signed by: ryan salvage MD 04/14/2024 05:25 PM EST RP Workstation: [id number]     ASSESSMENT AND PLAN: This is a 74 years old white male with metastatic non-small cell lung cancer, adenocarcinoma with no actionable mutations and PDL 1 expression of 5% that was initially diagnosed as stage IIIa non-small cell lung cancer, adenocarcinoma status post left lower lobectomy with lymph node dissection  followed by a course of concurrent chemoradiation completed in January 2016.  The patient had evidence for disease metastasis in October 2018 with metastatic disease to the left femur as well as left supraclavicular and right paratracheal lymph nodes. The patient is currently on systemic chemotherapy initially was with carboplatin , Alimta  and Keytruda .  Carboplatin  was discontinued secondary to hypersensitivity reaction starting from cycle #2.   He was also treated with 3 cycles of maintenance Alimta  and Ketruda (pembrolizumab ) but Alimta  was discontinued secondary to intolerance. He underwent treatment with maintenance Keytruda  as a single agent status post 49 cycles.  The patient has been on observation since January  2022.  He is doing fine with no concerning complaints. He had repeat CT scan of the chest, abdomen and pelvis performed recently.  I personally independently reviewed the scan images and discussed the result and showed the images to the patient today.  PET scan showed no concerning findings for disease progression. Assessment and Plan Assessment & Plan Metastatic lung adenocarcinoma Metastatic lung adenocarcinoma diagnosed October 2018, previously treated with carboplatin -based chemotherapy and pembrolizumab , followed by prolonged maintenance immunotherapy. He has been off all cancer-directed therapy and under surveillance since January 2022. Recent restaging CT of chest, abdomen, and pelvis demonstrated no evidence of metastatic disease or progression; the prior area of concern is now interpreted as a scar and has improved. He reports only mild worsening of exertional dyspnea, with no acute symptoms or evidence of recurrence. - Reviewed restaging CT imaging; no evidence of metastatic disease or progression. - Discussed imaging findings, including incidental hepatic steatosis and cholelithiasis. - Recommended continued surveillance with follow-up in six months. - Advised to contact the office sooner if new symptoms or concerns develop.  The patient was advised to call immediately if he has any other concerning symptoms in the interval.  The patient voices understanding of current disease status and treatment options and is in agreement with the current care plan. All questions were answered. The patient knows to call the clinic with any problems, questions or concerns. We can certainly see the patient much sooner if necessary. The total time spent in the appointment was 30 minutes including review of chart and various tests results, discussions about plan of care and coordination of care plan .  Disclaimer: This note was dictated with voice recognition software. Similar sounding words can  inadvertently be transcribed and may not be corrected upon review.       "

## 2024-04-29 ENCOUNTER — Other Ambulatory Visit: Payer: Self-pay | Admitting: Family Medicine

## 2024-04-29 DIAGNOSIS — N401 Enlarged prostate with lower urinary tract symptoms: Secondary | ICD-10-CM

## 2024-05-22 ENCOUNTER — Ambulatory Visit: Admitting: Family Medicine

## 2024-10-05 ENCOUNTER — Inpatient Hospital Stay

## 2024-10-12 ENCOUNTER — Inpatient Hospital Stay: Admitting: Internal Medicine
